# Patient Record
Sex: Male | Born: 1935
Health system: Southern US, Community
[De-identification: ages and names within clinical notes are randomized; demographics above are authoritative.]

## PROBLEM LIST (undated history)

## (undated) DIAGNOSIS — Z8719 Personal history of other diseases of the digestive system: Secondary | ICD-10-CM

## (undated) DIAGNOSIS — R0600 Dyspnea, unspecified: Secondary | ICD-10-CM

## (undated) DIAGNOSIS — I10 Essential (primary) hypertension: Secondary | ICD-10-CM

## (undated) DIAGNOSIS — E785 Hyperlipidemia, unspecified: Secondary | ICD-10-CM

## (undated) DIAGNOSIS — R911 Solitary pulmonary nodule: Secondary | ICD-10-CM

## (undated) DIAGNOSIS — E119 Type 2 diabetes mellitus without complications: Secondary | ICD-10-CM

## (undated) DIAGNOSIS — Z950 Presence of cardiac pacemaker: Secondary | ICD-10-CM

## (undated) DIAGNOSIS — U071 COVID-19: Secondary | ICD-10-CM

## (undated) DIAGNOSIS — I219 Acute myocardial infarction, unspecified: Secondary | ICD-10-CM

## (undated) DIAGNOSIS — R739 Hyperglycemia, unspecified: Secondary | ICD-10-CM

## (undated) HISTORY — DX: Hyperglycemia, unspecified: R73.9

## (undated) HISTORY — DX: Hyperlipidemia, unspecified: E78.5

## (undated) HISTORY — DX: Solitary pulmonary nodule: R91.1

---
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
## 1993-08-20 HISTORY — PX: CARDIAC CATHETERIZATION: SHX172

## 2000-02-15 ENCOUNTER — Ambulatory Visit (HOSPITAL_COMMUNITY): Admission: RE | Admit: 2000-02-15 | Discharge: 2000-02-15 | Payer: Self-pay | Admitting: *Deleted

## 2001-04-04 ENCOUNTER — Encounter (INDEPENDENT_AMBULATORY_CARE_PROVIDER_SITE_OTHER): Payer: Self-pay | Admitting: *Deleted

## 2001-04-04 ENCOUNTER — Ambulatory Visit (HOSPITAL_COMMUNITY): Admission: RE | Admit: 2001-04-04 | Discharge: 2001-04-05 | Payer: Self-pay | Admitting: *Deleted

## 2001-04-05 ENCOUNTER — Encounter: Payer: Self-pay | Admitting: *Deleted

## 2002-07-07 ENCOUNTER — Ambulatory Visit (HOSPITAL_COMMUNITY): Admission: RE | Admit: 2002-07-07 | Discharge: 2002-07-07 | Payer: Self-pay | Admitting: Urology

## 2003-07-06 ENCOUNTER — Ambulatory Visit (HOSPITAL_COMMUNITY): Admission: RE | Admit: 2003-07-06 | Discharge: 2003-07-06 | Payer: Self-pay | Admitting: Neurosurgery

## 2003-07-16 ENCOUNTER — Encounter: Admission: RE | Admit: 2003-07-16 | Discharge: 2003-07-16 | Payer: Self-pay | Admitting: Neurosurgery

## 2003-07-30 ENCOUNTER — Encounter: Admission: RE | Admit: 2003-07-30 | Discharge: 2003-07-30 | Payer: Self-pay | Admitting: Neurosurgery

## 2004-07-14 ENCOUNTER — Encounter: Admission: RE | Admit: 2004-07-14 | Discharge: 2004-07-14 | Payer: Self-pay | Admitting: Neurosurgery

## 2004-07-28 ENCOUNTER — Encounter: Admission: RE | Admit: 2004-07-28 | Discharge: 2004-07-28 | Payer: Self-pay | Admitting: Neurosurgery

## 2004-08-29 ENCOUNTER — Encounter: Admission: RE | Admit: 2004-08-29 | Discharge: 2004-08-29 | Payer: Self-pay | Admitting: Neurosurgery

## 2005-06-18 ENCOUNTER — Encounter (INDEPENDENT_AMBULATORY_CARE_PROVIDER_SITE_OTHER): Payer: Self-pay | Admitting: Specialist

## 2005-06-18 ENCOUNTER — Ambulatory Visit (HOSPITAL_COMMUNITY): Admission: RE | Admit: 2005-06-18 | Discharge: 2005-06-18 | Payer: Self-pay | Admitting: *Deleted

## 2006-05-03 ENCOUNTER — Encounter: Admission: RE | Admit: 2006-05-03 | Discharge: 2006-05-03 | Payer: Self-pay | Admitting: Internal Medicine

## 2006-08-20 HISTORY — PX: BACK SURGERY: SHX140

## 2006-12-20 ENCOUNTER — Inpatient Hospital Stay (HOSPITAL_COMMUNITY): Admission: RE | Admit: 2006-12-20 | Discharge: 2006-12-24 | Payer: Self-pay | Admitting: Neurosurgery

## 2007-01-01 ENCOUNTER — Ambulatory Visit (HOSPITAL_COMMUNITY): Admission: RE | Admit: 2007-01-01 | Discharge: 2007-01-01 | Payer: Self-pay | Admitting: Internal Medicine

## 2007-03-25 ENCOUNTER — Ambulatory Visit: Payer: Self-pay | Admitting: Thoracic Surgery

## 2007-03-25 ENCOUNTER — Encounter: Admission: RE | Admit: 2007-03-25 | Discharge: 2007-03-25 | Payer: Self-pay | Admitting: Thoracic Surgery

## 2008-01-16 ENCOUNTER — Encounter: Admission: RE | Admit: 2008-01-16 | Discharge: 2008-01-16 | Payer: Self-pay | Admitting: Internal Medicine

## 2008-06-24 ENCOUNTER — Encounter: Admission: RE | Admit: 2008-06-24 | Discharge: 2008-06-24 | Payer: Self-pay | Admitting: Internal Medicine

## 2008-08-03 ENCOUNTER — Encounter: Admission: RE | Admit: 2008-08-03 | Discharge: 2008-08-03 | Payer: Self-pay | Admitting: Internal Medicine

## 2008-08-20 HISTORY — PX: REPLACEMENT TOTAL KNEE: SUR1224

## 2009-02-03 ENCOUNTER — Encounter
Admission: RE | Admit: 2009-02-03 | Discharge: 2009-05-04 | Payer: Self-pay | Admitting: Physical Medicine & Rehabilitation

## 2009-02-04 ENCOUNTER — Ambulatory Visit: Payer: Self-pay | Admitting: Physical Medicine & Rehabilitation

## 2009-03-07 ENCOUNTER — Ambulatory Visit: Payer: Self-pay | Admitting: Physical Medicine & Rehabilitation

## 2009-04-14 ENCOUNTER — Ambulatory Visit: Payer: Self-pay | Admitting: Physical Medicine & Rehabilitation

## 2009-05-09 ENCOUNTER — Encounter
Admission: RE | Admit: 2009-05-09 | Discharge: 2009-06-30 | Payer: Self-pay | Admitting: Physical Medicine & Rehabilitation

## 2009-05-12 ENCOUNTER — Ambulatory Visit: Payer: Self-pay | Admitting: Physical Medicine & Rehabilitation

## 2009-05-30 ENCOUNTER — Ambulatory Visit: Payer: Self-pay | Admitting: Physical Medicine & Rehabilitation

## 2009-06-27 ENCOUNTER — Ambulatory Visit: Payer: Self-pay | Admitting: Physical Medicine & Rehabilitation

## 2009-11-28 ENCOUNTER — Inpatient Hospital Stay (HOSPITAL_COMMUNITY): Admission: RE | Admit: 2009-11-28 | Discharge: 2009-12-02 | Payer: Self-pay | Admitting: Orthopedic Surgery

## 2010-11-07 LAB — BASIC METABOLIC PANEL
BUN: 10 mg/dL (ref 6–23)
CO2: 33 mEq/L — ABNORMAL HIGH (ref 19–32)
Calcium: 8.5 mg/dL (ref 8.4–10.5)
Chloride: 95 mEq/L — ABNORMAL LOW (ref 96–112)
Creatinine, Ser: 0.75 mg/dL (ref 0.4–1.5)
GFR calc Af Amer: >60 mL/min (ref >60)
GFR calc non Af Amer: >60 mL/min (ref >60)
Glucose, Bld: 126 mg/dL — ABNORMAL HIGH (ref 70–99)
Potassium: 3.7 mEq/L (ref 3.5–5.1)
Sodium: 134 mEq/L — ABNORMAL LOW (ref 135–145)

## 2010-11-07 LAB — CBC
HCT: 27.5 % — ABNORMAL LOW (ref 39.0–52.0)
Hemoglobin: 9.4 g/dL — ABNORMAL LOW (ref 13.0–17.0)
MCHC: 34.1 g/dL (ref 30.0–36.0)
MCV: 94.5 fL (ref 78.0–100.0)
Platelets: 152 10*3/uL (ref 150–400)
RBC: 2.91 MIL/uL — ABNORMAL LOW (ref 4.22–5.81)
RDW: 13.3 % (ref 11.5–15.5)
WBC: 7 10*3/uL (ref 4.0–10.5)

## 2010-11-07 LAB — GLUCOSE, CAPILLARY: Glucose-Capillary: 139 mg/dL — ABNORMAL HIGH (ref 70–99)

## 2010-11-07 LAB — PROTIME-INR
INR: 1.13 (ref 0.00–1.49)
Prothrombin Time: 14.4 seconds (ref 11.6–15.2)

## 2010-11-08 LAB — PROTIME-INR
INR: 1.07 (ref 0.00–1.49)
INR: 1.12 (ref 0.00–1.49)
INR: 1.2 (ref 0.00–1.49)
Prothrombin Time: 14.3 seconds (ref 11.6–15.2)
Prothrombin Time: 15.1 seconds (ref 11.6–15.2)
Prothrombin Time: 15.7 seconds — ABNORMAL HIGH (ref 11.6–15.2)

## 2010-11-08 LAB — BASIC METABOLIC PANEL
BUN: 8 mg/dL (ref 6–23)
CO2: 29 mEq/L (ref 19–32)
Calcium: 8.3 mg/dL — ABNORMAL LOW (ref 8.4–10.5)
Calcium: 8.5 mg/dL (ref 8.4–10.5)
Chloride: 99 mEq/L (ref 96–112)
Creatinine, Ser: 0.71 mg/dL (ref 0.4–1.5)
Creatinine, Ser: 0.86 mg/dL (ref 0.4–1.5)
Creatinine, Ser: 0.96 mg/dL (ref 0.4–1.5)
GFR calc Af Amer: >60 mL/min (ref >60)
GFR calc Af Amer: >60 mL/min (ref >60)
GFR calc non Af Amer: >60 mL/min (ref >60)
GFR calc non Af Amer: >60 mL/min (ref >60)

## 2010-11-08 LAB — COMPREHENSIVE METABOLIC PANEL
AST: 23 U/L (ref 0–37)
Albumin: 4 g/dL (ref 3.5–5.2)
Alkaline Phosphatase: 54 U/L (ref 39–117)
BUN: 12 mg/dL (ref 6–23)
CO2: 27 mEq/L (ref 19–32)
Chloride: 101 mEq/L (ref 96–112)
GFR calc non Af Amer: >60 mL/min (ref >60)
Potassium: 3.8 mEq/L (ref 3.5–5.1)
Total Bilirubin: 1 mg/dL (ref 0.3–1.2)

## 2010-11-08 LAB — GLUCOSE, CAPILLARY
Glucose-Capillary: 107 mg/dL — ABNORMAL HIGH (ref 70–99)
Glucose-Capillary: 108 mg/dL — ABNORMAL HIGH (ref 70–99)
Glucose-Capillary: 130 mg/dL — ABNORMAL HIGH (ref 70–99)
Glucose-Capillary: 138 mg/dL — ABNORMAL HIGH (ref 70–99)
Glucose-Capillary: 145 mg/dL — ABNORMAL HIGH (ref 70–99)
Glucose-Capillary: 153 mg/dL — ABNORMAL HIGH (ref 70–99)
Glucose-Capillary: 157 mg/dL — ABNORMAL HIGH (ref 70–99)
Glucose-Capillary: 94 mg/dL (ref 70–99)

## 2010-11-08 LAB — CBC
HCT: 38.6 % — ABNORMAL LOW (ref 39.0–52.0)
Hemoglobin: 10.4 g/dL — ABNORMAL LOW (ref 13.0–17.0)
Hemoglobin: 13.3 g/dL (ref 13.0–17.0)
MCHC: 34.4 g/dL (ref 30.0–36.0)
MCHC: 34.4 g/dL (ref 30.0–36.0)
MCV: 93.4 fL (ref 78.0–100.0)
MCV: 94.2 fL (ref 78.0–100.0)
MCV: 94.9 fL (ref 78.0–100.0)
Platelets: 140 10*3/uL — ABNORMAL LOW (ref 150–400)
Platelets: 154 10*3/uL (ref 150–400)
RBC: 2.93 MIL/uL — ABNORMAL LOW (ref 4.22–5.81)
RBC: 3.22 MIL/uL — ABNORMAL LOW (ref 4.22–5.81)
RBC: 4.1 MIL/uL — ABNORMAL LOW (ref 4.22–5.81)
RDW: 13 % (ref 11.5–15.5)
WBC: 6 10*3/uL (ref 4.0–10.5)
WBC: 7.7 10*3/uL (ref 4.0–10.5)
WBC: 7.8 10*3/uL (ref 4.0–10.5)

## 2010-11-08 LAB — URINALYSIS, ROUTINE W REFLEX MICROSCOPIC
Bilirubin Urine: NEGATIVE
Hgb urine dipstick: NEGATIVE
Ketones, ur: NEGATIVE mg/dL
Specific Gravity, Urine: 1.016 (ref 1.005–1.030)
pH: 5.5 (ref 5.0–8.0)

## 2010-11-08 LAB — TYPE AND SCREEN: Antibody Screen: NEGATIVE

## 2011-01-02 NOTE — Letter (Signed)
March 25, 2007   Massie Maroon, MD  [address]  [place], [place]  [address]   Re:  [name], [name]               DOB:  01/05/1926   Dear Dr. Selena Batten:   I saw Dylan [name] in the office today.  He had recently underwent back  surgery and was found to have a right upper lobe lesion.  PET scan was  negative, so we elected to do a follow up CT scan now approximately  three months since his PET scan.  The CT scan showed no evidence of the  lesion throughout his right upper lobe, so this is probably some type of  a localized inflammatory nodule which is good news for Dylan [name].  His  blood pressure was 121/66, pulse 66, respirations 18.  I will be happy  to see him again if he develops any further pulmonary problems.  I  appreciate the opportunity of seeing Dylan [name].   Ines Bloomer, M.D.  Electronically Signed   DPB/MEDQ  D:  03/25/2007  T:  03/26/2007  Job:  [id number]   cc:   Colleen Can. Deborah Chalk, M.D.

## 2011-01-02 NOTE — H&P (Signed)
NAMEELMORE, HYSLOP NO.:  [id number]   MEDICAL RECORD NO.:  [id number]          PATIENT TYPE:  INP   LOCATION:  3009                         FACILITY:  [facility]   PHYSICIAN:  Hilda Lias, M.D.   DATE OF BIRTH:  [birth year]   DATE OF ADMISSION:  12/20/2006  DATE OF DISCHARGE:                              HISTORY & PHYSICAL   Mr. Edler is a gentleman who was seen initially by me at the end of  last year and, later on, several weeks ago, complaining of back pain  with radiation to both legs, which gets worse with walking.  At the  beginning, he was able to walk quite a bit, 2 or 3 blocks, but now up to  20 feet.  He had to sit because the pain gets unbearable.  When he lies  down in bed, there is no pain.  We know by x-ray that he has cervical  stenosis at the level of 3-4, 4-5.  Now, he comes because he wants to  proceed with surgery because he cannot live with the pain.   PAST MEDICAL HISTORY:  Needs surgery.  He has stent in heart.   HE IS ALLERGIC TO KEFLEX.   SOCIAL HISTORY:  Does not smoke.  Occasionally, he drinks.   FAMILY HISTORY:  Unremarkable.   REVIEW OF SYSTEMS:  Is positive for knee pain and back pain.   PHYSICAL EXAMINATION:  HEENT:  Head, ears and throat normal.  NECK:  Normal.  LUNGS:  Clear.  HEART:  Sounds normal.  ABDOMEN:  Normal.  EXTREMITIES:  Normal pulse.  NEURO:  He has weakness in both legs.  He has decrease in flexibility of  lumbar spine.  He had difficulty walking on tiptoes and heels, and  lifting both legs is positive bilaterally.   The x-ray shows he has a severe case of stenosis at L3-4 and L4-5 with  spondylolisthesis about 2 mm between L3 and L4, which does not move.   CLINICAL IMPRESSION:  L3-4, L4-5 stenosis with neurogenic claudication.   RECOMMENDATIONS:  The patient wants to proceed with surgery.  The  procedure will be bilateral 3, 4, 5 laminectomies with foraminotomy,  posterolateral fusion using the  autograft and __________ .  He knows  about the risks, such as infection, CSF leak, damage to the vessel of  the abdomen, need for further surgery, failure of the graft and all the  risks associated with his heart.           ______________________________  Hilda Lias, M.D.     EB/MEDQ  D:  12/20/2006  T:  12/21/2006  Job:  [id number]

## 2011-01-02 NOTE — Discharge Summary (Signed)
[name], Dylan               ACCOUNT NO.:  [id number]   MEDICAL RECORD NO.:  [id number]          PATIENT TYPE:  INP   LOCATION:  3009                         FACILITY:  [facility]   PHYSICIAN:  Hilda Lias, M.D.   DATE OF BIRTH:  1926/02/16   DATE OF ADMISSION:  12/20/2006  DATE OF DISCHARGE:  12/24/2006                               DISCHARGE SUMMARY   ADMISSION DIAGNOSIS:  Lumbar stenosis with neurogenic claudication.   FINAL DIAGNOSIS:  Lumbar stenosis with neurogenic claudication.   CLINICAL HISTORY:  The patient was admitted because of back pain  radiating to both legs.  The patient, a year ago, knew of his stenosis  at the level of 3-4, 4-5.  Now he comes to my office telling me he is  getting worse, he wants to go ahead and have surgery.   LABORATORY DATA:  Normal.   COURSE IN THE HOSPITAL:  The patient was taken to surgery and L3-4-5  laminectomy and posterior lateral arthrodesis was done.  Today he is  doing much better.  The wound looks fine.  He is afebrile.  He is  ambulating and he is ready to go home.   CONDITION ON DISCHARGE:  Improved.   MEDICATIONS:  Percocet, diazepam.   DIET:  Regular.   ACTIVITY:  He is not to drive for at least a week.   FOLLOWUP:  He will be seen by me in my office in 10 days.  Follow up  p.r.n.           ______________________________  Hilda Lias, M.D.     EB/MEDQ  D:  12/24/2006  T:  12/24/2006  Job:  [id number]

## 2011-01-02 NOTE — Group Therapy Note (Signed)
The patient referred by Dr. [facility].   HISTORY:  An 77 year old male with history of back pain of at least 6-  year duration.  He has had CAT scan back in 2004 showing L1-L2 disk  herniation multifactorial spinal stenosis L3-L4, and severe stenosis L4-  L5.  He has undergone epidural steroid injections interlaminar L5-S1 in  2004 and 2005, caudal epidural steroid injection in 2005 December and  further interlaminar in 2006. He demonstrated progressive spinal  stenosis on a follow up MRI in 2007 and eventually underwent lumbar  decompression with laminectomy, foraminotomy, posterolateral fusion Dec 20, 2006 that was on the op report however, the most recent MRI showed no  evidence of instrumentation. Postoperatively lumbar epidural injection  in May 2009 and November 2009 as well as bilateral L4-L5  facet  injections.  The most recent MRI demonstrated postop bilateral  laminectomy L4-L5, L5-S1 as well as L3-L4 level and has noted no  evidence of instrumentation.   His pain level is around 8/10, describes as sharp, stabbing, aching  issues.  His sleep is fair.  Pain is worse with walking, standing,  improves with resting with meds little to fair mobility.  He walks  without assistance.  He drives. He is independent with all self-care.   REVIEW OF SYSTEMS:  His review of systems positive for numbness, trouble  walking.  The numbness is primarily in the right lower extremity.  He  also has right knee pain which he thinks may be a separate issue has had  arthroscopy in the past.   PAST MEDICAL HISTORY:  Peptic ulcer disease status post GI bleed in  2001.  He has had  DVT, post arthroscopy many years ago.  He has had a  polypectomy for lower GI bleed and he had vein stripping in 2002 and  coronary stent placements although I do not have the date when that was  done.   SOCIAL HISTORY:  Married, lives with his wife.   PHYSICAL EXAM:  VITAL SIGNS:  His blood  pressure 150/60, pulse 62,  respirations 18 and O2 sat 96% room air.  GENERAL:  Well-developed, well-nourished male, truncal obesity.  Orientation x3.  Affect bright alert.  Gait is normal. He is able to toe  walk, heel walk, has coordination normal in the upper lower extremities.  NEUROLOGIC:  His sensation reduced to vibration, pinprick right L5-S1.  EXTREMITIES:  Without edema.   His neck has full range of motion.  Lumbar spine range of motion is 50%  forward flexion, extension, lateral rotation, bending.  His right knee  range of motion is decreased in flexion, full extension.  He has  crepitus flexion extension.  He does have some bony enlargement but no  evidence of valgus or varus deformity that I can appreciate.  He has no  evidence of erythema and minimal effusion compared to the left knee.   IMPRESSION:  1. Lumbar post laminectomy syndrome, chronic postoperative pain.  I      believe he has some multifactorial pain in his lumbar spine but      mainly axial and may be attributed to facet arthropathy.      Therefore, we will set him up for medial branch blocks as a      diagnostic procedure and proceed from there.  2. Right knee pain likely arthritic.  The patient will decide whether      he wants to go see his orthopedic surgeon for this or  whether we      will go ahead and address this as part of his overall pain program.   I spoke to him and his wife about these issues in agreement.  Really not  on any narcotic analgesics not going down his throat at this point.  The  patient seems fine with that.  Consider some physical therapy at least  to get him exercise program that he can do on  ongoing basis so that we  will wait until we identify pain generator.      Erick Colace, M.D.  Electronically Signed     AEK/MedQ  D:  02/04/2009 17:11:53  T:  02/05/2009 09:27:20  Job #:  [id number]   cc:   Massie Maroon, MD  Fax: [phone]

## 2011-01-02 NOTE — Procedures (Signed)
Dylan [name], BALLIN               ACCOUNT NO.:  [id number]   MEDICAL RECORD NO.:  [id number]           PATIENT TYPE:   LOCATION:                                 FACILITY:   PHYSICIAN:  Erick Colace, M.D.DATE OF BIRTH:  [birth year]   DATE OF PROCEDURE:  04/14/2009  DATE OF DISCHARGE:                               OPERATIVE REPORT   Bilateral L5 dorsal ramus injection, bilateral L4 medial branch block,  bilateral L3 medial branch block under fluoroscopic guidance.   INDICATIONS:  Lumbar postlaminectomy syndrome.  Pain only partially  responsive to medication management and interfering with self-care  mobility.  He had about 50% relief for the day of injection but was not  very active that day, here for confirmatory block with longer-acting  local anesthetic.  Informed consent was obtained after describing risks  and benefits of procedure with the patient.  These include bleeding,  bruising, infection.  He elects to proceed and has given written  consent.   The patient placed prone on fluoroscopy table.  Betadine prep, sterile  drape.  A 25-gauge inch and a half needle was used to anesthetize the  skin and subcu tissue, 1% lidocaine x1.5 mL at each of 6 sites.  Then, a  22-gauge 3-1/2-inch spinal needle was inserted under fluoroscopic  guidance targeting the left S1 sacral ala junction with the SAP.  Bone  contact made, confirmed with lateral imaging.  Omnipaque 180 x0.5 mL  demonstrated no intravascular uptake.  Then, 0.5% Marcaine was injected.  Then, the left L5 SAP transverse process junction targeted, bone contact  made.  Omnipaque 180 under live fluoro demonstrated no intravascular  uptake.  Then, 0.5 mL of the Marcaine solution was injected.  Then, the  left L4 SAP transverse process junction targeted, bone contact made.  Omnipaque 180 x0.5 mL demonstrated no intravascular uptake.  Then, 0.5  mL of the Marcaine solution was injected.  The same procedure was  repeated on  the right side with the same needle, injectate, and  technique.  The patient tolerated the procedure well.  Pre and  postinjection vitals stable.  Postinjection instructions given.  Preinjection 10/10, postinjection 07/10.  We will follow over time.  See  him back for followup visit and decide whether radiofrequency would be a  promising option.      Erick Colace, M.D.  Electronically Signed     AEK/MEDQ  D:  04/14/2009 10:21:16  T:  04/15/2009 00:16:34  Job:  [id number]

## 2011-01-02 NOTE — Op Note (Signed)
Dylan [name], Dylan [name]               ACCOUNT NO.:  [id number]   MEDICAL RECORD NO.:  [id number]          PATIENT TYPE:  INP   LOCATION:  3172                         FACILITY:  [facility]   PHYSICIAN:  Hilda Lias, M.D.   DATE OF BIRTH:  02-10-1926   DATE OF PROCEDURE:  12/20/2006  DATE OF DISCHARGE:                               OPERATIVE REPORT   PREOPERATIVE DIAGNOSIS:  Lumbar stenosis with neurogenic claudication, 3-  4, 4-5.   POSTOPERATIVE DIAGNOSIS:  Lumbar stenosis with neurogenic claudication,  3-4, 4-5.   PROCEDURES:  Bilateral L3, 4 and 5 laminectomy, foraminotomy;  posterolateral fusion with autograft and bone morphogenic protein.   SURGEON:  Hilda Lias, M.D.   CLINICAL HISTORY:  Dylan [name] is a gentleman, who was seen more than a  year ago, complaining of back pain with radiation to both legs.  The  patient was getting worse, and x-rays showed that he had stenosis at the  levels of 3-4 and 4-5, with some stepoff between 4-5, which does not  move into flexion or extension.  The patient wanted to proceed with  surgery, because right now he only can walk 20 feet before he has to sit  because of pain.  Surgery was advised and the risks were explained to  him at the History and Physical.   DESCRIPTION OF PROCEDURES:  Dylan [name] was taken to the operating room,  and after intubation, he was positioned in a prone manner.  The back was  cleaned with DuraPrep.  Drapes were applied.  A midline incision from L3  down to L5-S1 was made, and muscles were retracted laterally.  The  patient has a highly vascularized muscle, and the arteries were quite  atherosclerotic.  Nevertheless, at the end, we had good hemostasis, and  we were able to go beyond the lateral aspect of the facetectomy.  Then,  we proceeded with removal of the spinous processes of 3, 4 and 5, and  with a drill, we did laminectomies of 3, 4 and 5 bilaterally.  Then,  using the 2, 3 and 4-mm Kerrison punches, we  started to decompress the  thecal sac.  The worst part was at the junction between 3-4, and also  between 4-5.  The patient had quite a bit of thickening of the yellow  ligament, as well as calcification.  Using the drill, we were able to go  laterally.  Then, using the 2 and 3-mm Kerrison punches, we decompressed  the L2, L3, L4, L5 and S1 nerve roots bilaterally.  Having done this, we  went laterally and we drilled the periosteum on the lateral aspect of  the facet of 3-4, 4-5 and 5-1, and a mix of BMP and autograft was used  for arthrodesis.  Although we had good hemostasis, nevertheless, we  decided to leave a Hemovac in the epidural space.  The area was  irrigated, and the wound was closed with Vicryl and nylon.  The patient  is going to go to the PACU.           ______________________________  Hilda Lias, M.D.  EB/MEDQ  D:  12/20/2006  T:  12/20/2006  Job:  [id number]

## 2011-01-02 NOTE — Procedures (Signed)
Dylan [name], Dylan [name]               ACCOUNT NO.:  [id number]   MEDICAL RECORD NO.:  [id number]           PATIENT TYPE:   LOCATION:                                 FACILITY:   PHYSICIAN:  Erick Colace, M.D.DATE OF BIRTH:  [birth year]   DATE OF PROCEDURE:  DATE OF DISCHARGE:                               OPERATIVE REPORT   PROCEDURE:  Bilateral L5 dorsal ramus injection, bilateral L4 medial  branch block, bilateral L3 medial branch block under fluoroscopic  guidance.   INDICATIONS:  Lumbar pain post laminectomy syndrome.  Pain is only  partially responsive to medication management, interferes with self-  care, mobility.  Informed consent was obtained after describing risks  and benefits of the procedure with the patient.  These include bleeding,  bruising, infection.  He elects to proceed and has given written  consent.   The patient placed prone on the fluoroscopy table.  Betadine prep,  sterile drape.  A 25-gauge inch and a half needle was used to  anesthetize the skin and subcutaneous tissue, 1% lidocaine x2 mL at each  of six sites.  Then a 22-gauge 2-1/2 inch spinal needle was inserted  under fluoroscopic guidance starting at the left S1 SAP sacral ala  junction, bone contact made, confirmed with lateral imaging.  Omnipaque  180 x 0.5 mL demonstrated no intravascular uptake, then 0.5 mL of the  solution containing 1 mL of 4 mg/mL dexamethasone and 2 mL of 2% MPF  lidocaine.  Then, left L5 SAP transverse process junction targeted, bone  contact made.  Omnipaque 180 under live fluoro demonstrated no  intravascular uptake, then 0.5 mL of dexamethasone-lidocaine solution  was injected.  Then, left L4 SAP transverse process junction targeted,  bone contact made.  Omnipaque 180 x 0.5 mL demonstrated no intravascular  uptake.  Then 0.5 mL of the dexamethasone lidocaine solution was  injected.  The same procedure was repeated on the right side with the  same needle injectate and  technique.  The patient tolerated the  procedure well.  Right side injections done at corresponding levels.  Pre and post injection vitals stable.  Post injection instructions  given.  I will see him back in 4 weeks for possible reinjection.      Erick Colace, M.D.  Electronically Signed     AEK/MEDQ  D:  03/07/2009 13:46:45  T:  03/08/2009 03:07:41  Job:  [id number]

## 2011-01-05 NOTE — Op Note (Signed)
[place]  Patient:    Dylan [name], Dylan [name]                      MRN: [id number] Proc. Date: 04/04/01 Adm. Date:  [id number] Disc. Date: [id number] Attending:  Melvenia Needles                           Operative Report  PREOPERATIVE DIAGNOSES: 1. Bilateral greater saphenous vein incompetence with varicose veins. 2. Multiple varicose veins bilaterally.  POSTOPERATIVE DIAGNOSES: 1. Bilateral greater saphenous vein incompetence with varicose veins. 2. Multiple varicose veins bilaterally.  OPERATION: 1. Greater saphenous vein stripping and ligation bilaterally. 2. Stripping and ligation of multiple venous tributaries bilaterally,    lower extremities.  SURGEON:  Denman George, M.D.  ASSISTANT:   Adair Patter, P.A.  ANESTHESIA:  General endotracheal anesthesia.  ANESTHESIOLOGIST:  Edwin Cap. Zoila Shutter, M.D.  DESCRIPTION OF OPERATIVE PROCEDURE:  The patient was brought to the operating room in stable condition and placed in the supine position.  General endotracheal anesthesia was induced.  Both legs prepped and draped in a sterile fashion.  Oblique skin incision was made in the groin bilaterally at the saphenofemoral function.  Bilateral saphenofemoral junction explored.  Tributaries ligated with 3-0 silk and divided.  Saphenous vein ligated at saphenofemoral junction with 2-0 silk.  Bilateral medial malleolar transverse skin incisions were made in saphenous vein.  Saphenous vein exposed to reach incision, ligated distally with 2-0 silk.  Saphenous vein opened transversely.  The stripper was attempted to be passed antegrade up the saphenous vein; however, unsuccessfully.  The saphenous vein was therefore ligated at the saphenofemoral junction and divided.  The stripper was passed retrograde each leg, and in the right leg could be passed to the proximal calf and in the distal leg to the mid calf. At the point where the stripper stopped,  transverse skin incision was made over the greater saphenous vein.  The saphenous vein exposed through this. Transverse venotomy made and then the stripper passed through the venotomy. Stripper assembled, and each greater saphenous vein was stripped entirely from the mid proximal calf to the groin.  At premarked sites, transverse skin incisions were made over large venous tributaries.  These were stripped and ligated with 2-0 silk ties.  The wounds were irrigated with saline solution.  The groin incisions closed with running 3-0 Vicryl suture for subcutaneous tissue and 4-0 Monocryl for skin.  The ankle incisions were also closed with running 3-0 Vicryl sutures for subcutaneous tissue and 4-0 Monocryl for skin. The small cut incisions were closed with interrupted 3-0 Vicryl suture for dermal layer.  Half-inch Steri-Strips applied.  A dressing of Kerlix and Ace wrap was applied to the legs bilaterally.  No apparent complications.  Patient was transferred to the recovery room in stable condition. DD:  04/04/01 TD:  04/05/01 Job: [id number]

## 2011-01-05 NOTE — H&P (Signed)
Sherrill, Maksim   [place]                                  MRN:  [id number]         History and Physical                                  ATT:  Denman George, M.D.                                   DICT: Durenda Age, P.A.-C. CHIEF COMPLAINT:  Bilateral varicose veins.  HISTORY OF PRESENT ILLNESS:  This is a 77 year old, white male referred by Dr. Lendell Caprice for evaluation of varicose veins bilaterally.  His varicosities have been present for several years with some degree of swelling in both lower extremities, more pronounced on the right than on the left, with a feeling of heaviness and achiness.  Initial treatment with compression stockings produced some relief, but continued to experience aching and discomfort.  Dr. Madilyn Fireman, upon evaluation, recommended at this time to schedule bilateral ligation and stripping of these varicose veins on April 04, 2001.  PAST MEDICAL HISTORY: 1. Hypertension. 2. Hypercholesterolemia. 3. Irregular heart beat of unknown etiology. 4. Previous history of PUD. 5. Previous history of "skin cancers," unknown type. 6. History of DVT one week after right knee arthroscopy in 1986.  PAST SURGICAL HISTORY:  Status post right knee arthroscopy in 1986, with Dr. Tresa Res.  MEDICATIONS: 1. Toprol 50 mg q.d. 2. Lipitor 10 mg q.d. 3. Aspirin 81 mg q.o.d.  ALLERGY:  ANCEF.  REVIEW OF SYSTEMS:  See HPI and past medical history for significant positives.  No diabetes, kidney disease or asthma.  FAMILY HISTORY:  Mother died of cancer at age 87.  Father died at age 41 of stroke.  One brother died of heart failure.  SOCIAL HISTORY:  Widowed with one grown child.  He is retired.  He quit in 1992.  He uses tobacco.  He drinks one glass of wine a day.  PHYSICAL EXAMINATION:  GENERAL:  Well-developed, well-nourished, 77 year old, white male in no acute distress, alert and oriented x 3.  VITAL SIGNS:   Blood pressure 130/80, pulse 64, respiratory rate 16.  HEENT:  Normocephalic, atraumatic.  PERRLA.  EOMI.  Funduscopic exam within normal limits.  The patient has arcus senilis bilaterally.  NECK:  Supple, no JVD, bruits or lymphadenopathy.  CHEST:  Symmetrical on inspiration.  LUNGS:  No wheezes, rhonchi, rales or lymphadenopathy.  CARDIAC:  Regular rate and rhythm without murmurs, rubs or gallops.  ABDOMEN:  Soft, nontender, bowel sounds x 4.  No masses or bruits.  GU/RECTAL:  Deferred.  EXTREMITIES:  No clubbing, cyanosis, or edema.  No ulcerations.  There are pronounced varicosities more visual on the right than on the left, which are reducible with elevation, nontender to palpation, no Homans sign. Temperature warm.  Peripheral pulses, carotid, femoral, popliteal, dorsalis pedis and posterior tibialis 2+ bilaterally.  NEUROLOGIC:  Nonfocal.  DTRs 2+ bilaterally.  Muscle strength 5/5.  ASSESSMENT/PLAN:  Ligation and stripping of the varicose veins bilaterally secondary to pronounced varicosities by Dr. Madilyn Fireman on April 04, 2001. Dr. Madilyn Fireman has seen and evaluated this patient prior to the admission and has explained the risks  and benefits involving the procedure and the patient has agreed to continue. DD:  04/02/01 TD:  04/02/01 Job: [id number]

## 2011-01-05 NOTE — Procedures (Signed)
[facility]  Patient:    [name], Dylan                      MRN: [id number] Adm. Date:  [id number] Attending:  Sabino Gasser                           Procedure Report  PROCEDURE:  Upper endoscopy with biopsy.  ENDOSCOPIST:  Sabino Gasser, M.D.  INDICATION:  GI bleed.  ANESTHESIA:  Demerol 40 mg and Versed 4 mg were given intravenously in divided dose.  DESCRIPTION OF PROCEDURE:  With patient mildly sedated in the left lateral decubitus position, the Olympus videoscopic endoscope was inserted in the mouth and passed under direct vision through the esophagus, which appeared normal, and was photographed.  We advanced through this area.  Fundus, body and antrum were visualized and the pylorus was widely patent.  We entered easily into this and it was seen to be quite inflamed and there were two ulcers seen, one anteriorly and one posteriorly, both fairly deep craters, no visible vessels seen, but slight oozing from both, more so the anterior than the posterior.  We advanced past the second portion of duodenum, which appeared normal, and it was photographed.  From this point, the endoscope was then slowly withdrawn, take circumferential views of the entire duodenal mucosa.  We washed the duodenal bulb area looking for further pathology and saw none other than these two ulcers at this time.  The endoscope was pulled back into the stomach and placed in retroflexion to view the stomach from below and this appeared normal and was photographed.  The endoscope was straightened and pulled back then from distal-to-proximal stomach, taking circumferential views of the entire gastric and subsequently esophageal mucosa, after a CLO biopsy was taken.  The endoscope was withdrawn.  Patients vital signs and pulse oximetry remained stable.  Patient tolerated the procedure well without apparent complications.  FINDINGS:  Ulceration of duodenal bulb in two locations,  anterior and posterior, with oozing seen, no visible vessels, and probably diffuse H. pylori gastritis noted with CLO biopsy taken.  PLAN:  Patient has already been started on Helicobacter pylori therapy in the form of Prevpac which we will continue for the full two-week course; subsequent to that, we will continue Prevacid daily and will have the patient follow up with me as an outpatient.  The stools have turned from black to dark brown, indicating a significant slowing of his blood loss.  Additionally, I will tell him to refrain from aspirin at this time, even in low dose, and refrain from alcohol.  He plans to take a fishing trip with friends, leaving for [facility] in the morning, and I think he can do it if he abstains from alcohol and NSAIDs and takes his medication. DD:  02/15/00 TD:  02/16/00 Job: [id number]

## 2011-01-05 NOTE — Op Note (Signed)
NAMELOEL, BETANCUR               ACCOUNT NO.:  [id number]   MEDICAL RECORD NO.:  [id number]          PATIENT TYPE:  AMB   LOCATION:  ENDO                         FACILITY:  [facility]   PHYSICIAN:  Georgiana Spinner, M.D.    DATE OF BIRTH:  [birth year]   DATE OF PROCEDURE:  06/18/2005  DATE OF DISCHARGE:                                 OPERATIVE REPORT   PROCEDURE:  Colonoscopy with polypectomy and biopsy.   INDICATIONS:  Colon polyp, rectal bleeding.   ANESTHESIA:  Demerol 40, Versed 7 mg.   PROCEDURE:  With the patient mildly sedated in the left lateral decubitus  position, a rectal examination was performed which was unremarkable to my  exam. Subsequently the Olympus videoscopic colonoscope was inserted in the  rectum, passed under direct vision to the cecum identified by the ileocecal  valve and crow's foot of the cecum both of which were photographed. From  this point, the colonoscope was slowly withdrawn taking circumferential  views of the colonic mucosa stopping in the ascending colon to photograph an  approximately 2 cm lipoma. We next stopped at approximately 25 cm from anal  verge at which point a polyp was seen, photographed and removed using a  snare. Unfortunately the nurse cut through the snare quickly and I had to go  back with the hot biopsy forceps to burn this area which we did.  Subsequently the endoscope was withdrawn to the rectum which appeared normal  on direct and showed hemorrhoids on retroflexed view. The endoscope was  straightened and withdrawn. The patient's vital signs and pulse oximeter  remained stable. The patient tolerated the procedure well without apparent  complications other than the snaring of the polyp without cautery  subsequently corrected with hot biopsy forceps.   PLAN:  Await biopsy report. The patient will call me for results and follow-  up with me as an outpatient.           ______________________________  Georgiana Spinner, M.D.     GMO/MEDQ  D:  06/18/2005  T:  06/18/2005  Job:  [id number]

## 2011-06-19 ENCOUNTER — Other Ambulatory Visit: Payer: Self-pay | Admitting: Gastroenterology

## 2012-01-07 ENCOUNTER — Encounter (HOSPITAL_COMMUNITY): Payer: Self-pay

## 2012-01-08 ENCOUNTER — Ambulatory Visit (HOSPITAL_COMMUNITY): Payer: Medicare Other | Admitting: Anesthesiology

## 2012-01-08 ENCOUNTER — Ambulatory Visit (HOSPITAL_COMMUNITY)
Admission: RE | Admit: 2012-01-08 | Discharge: 2012-01-08 | Disposition: A | Discharge status: Home / Self Care | Payer: Medicare Other | Source: Ambulatory Visit | Attending: Gastroenterology | Admitting: Gastroenterology

## 2012-01-08 ENCOUNTER — Encounter (HOSPITAL_COMMUNITY): Payer: Self-pay | Admitting: *Deleted

## 2012-01-08 ENCOUNTER — Encounter (HOSPITAL_COMMUNITY)
Admission: RE | Disposition: A | Discharge status: Home / Self Care | Payer: Self-pay | Source: Ambulatory Visit | Attending: Gastroenterology

## 2012-01-08 ENCOUNTER — Encounter (HOSPITAL_COMMUNITY): Payer: Self-pay | Admitting: Anesthesiology

## 2012-01-08 DIAGNOSIS — D126 Benign neoplasm of colon, unspecified: Secondary | ICD-10-CM | POA: Insufficient documentation

## 2012-01-08 HISTORY — DX: Essential (primary) hypertension: I10

## 2012-01-08 HISTORY — PX: HOT HEMOSTASIS: SHX5433

## 2012-01-08 LAB — BASIC METABOLIC PANEL
CO2: 27 mEq/L (ref 19–32)
Chloride: 103 mEq/L (ref 96–112)
Creatinine, Ser: 0.85 mg/dL (ref 0.50–1.35)
GFR calc Af Amer: >90 mL/min (ref >90)
Potassium: 3.9 mEq/L (ref 3.5–5.1)
Sodium: 139 mEq/L (ref 135–145)

## 2012-01-08 LAB — HEMOGLOBIN AND HEMATOCRIT, BLOOD
HCT: 41.2 % (ref 39.0–52.0)
Hemoglobin: 14.2 g/dL (ref 13.0–17.0)

## 2012-01-08 SURGERY — COLONOSCOPY WITH PROPOFOL
Anesthesia: Monitor Anesthesia Care

## 2012-01-08 MED ORDER — LACTATED RINGERS IV SOLN
INTRAVENOUS | Status: DC
  Administered 2012-01-08: 1000 mL via INTRAVENOUS

## 2012-01-08 MED ORDER — METHYLENE BLUE 1 % INJ SOLN
INTRAMUSCULAR | Status: AC
  Filled 2012-01-08: qty 10

## 2012-01-08 MED ORDER — FENTANYL CITRATE 0.05 MG/ML IJ SOLN
INTRAMUSCULAR | Status: DC | PRN
  Administered 2012-01-08 (×2): 25 ug via INTRAVENOUS
  Administered 2012-01-08: 50 ug via INTRAVENOUS

## 2012-01-08 MED ORDER — MIDAZOLAM HCL 5 MG/5ML IJ SOLN
INTRAMUSCULAR | Status: DC | PRN
  Administered 2012-01-08: 1 mg via INTRAVENOUS

## 2012-01-08 MED ORDER — PROPOFOL 10 MG/ML IV EMUL
INTRAVENOUS | Status: DC | PRN
  Administered 2012-01-08: 70 ug/kg/min via INTRAVENOUS

## 2012-01-08 SURGICAL SUPPLY — 22 items

## 2012-01-08 NOTE — Op Note (Signed)
[facility] [address] [place], [place]  [address]  COLONOSCOPY PROCEDURE REPORT  PATIENT:  Dylan [name], Dylan [name]  MR#:  [id number] BIRTHDATE:  02/24/1926, 85 yrs. old  GENDER:  male ENDOSCOPIST:  Willis Modena, MD REF. BY:  Pearson Grippe, M.D. PROCEDURE DATE:  01/08/2012 PROCEDURE:  Colonoscopy 30865 ASA CLASS:  Class III INDICATIONS:  colon polyp (211.3), submucosal lift of polyp MEDICATIONS:   MAC sedation, administered by CRNA  DESCRIPTION OF PROCEDURE:   After the risks benefits and alternatives of the procedure were thoroughly explained, informed consent was obtained.  Digital rectal exam was performed and revealed no abnormalities.   The Pentax Colonoscope [id number] and [id number]) endoscope was introduced through the anus and advanced to the cecum, which was identified by the ileocecal valve, without limitations.  The quality of the prep was good.. The instrument was then slowly withdrawn as the colon was fully examined. <<PROCEDUREIMAGES>>  FINDINGS:  Prep quality was good.  Few left colonic diverticula. Ileocecal valve reached; was able to look into the cecum, but could not visualize appendix and could not visualize all of the cecum. Directly contiguous to, but seemingly not involving, the ileocecal valve, a large carpeting multilobulated polyp was seen, approximately 20 x 25mm in diameter.  The polyp was soft in texture, and appears to grow over a fold (clamshell-type configuration) with extension into the proximal cecum and near (but seemingly not involving) the ilececal valve orifice. Attempts were made to submucosally lift the polyp with saline/methylene blue mixture but the polyp wouldn't elevate well. There was a 6mm proximal ascending colon polyp seen as wll, and this was not removed (will be in anticipated upcoming surgical field).  No other polyps, masses, vascular ectasias, or inflammatory changes were seen.  Normal retroflexed view  of rectum.  ENDOSCOPIC IMPRESSION:    1.  Large carpeting polyp adjacent to ileocecal valve, adenomatous by prior biopsies. 2.  Proximal ascending colon polyp, diminutive, not removed. 3.  Few left colonic diverticula. 4.  Otherwise normal colonoscopy.  RECOMMENDATIONS:      1.  Watch for potential complications of procedure. 2.  Surgical consultation for consideration of right hemicolectomy. 3.  Will discuss with Dr. Madilyn Fireman.  REPEAT EXAM:  No  ______________________________ Willis Modena  CC:  n. eSIGNEDWillis Modena at 01/08/2012 12:19 PM  Michela Pitcher, [phone]

## 2012-01-08 NOTE — Transfer of Care (Signed)
Immediate Anesthesia Transfer of Care Note  Patient: Dylan [name]  Procedure(s) Performed: Procedure(s) (LRB): COLONOSCOPY WITH PROPOFOL (N/A) HOT HEMOSTASIS (ARGON PLASMA COAGULATION/BICAP) (N/A)  Patient Location: PACU  Anesthesia Type: MAC  Level of Consciousness: awake, alert , oriented and patient cooperative  Airway & Oxygen Therapy: Patient Spontanous Breathing and Patient connected to face mask oxygen  Post-op Assessment: Report given to PACU RN, Post -op Vital signs reviewed and stable and Patient moving all extremities X 4  Post vital signs: stable  Complications: No apparent anesthesia complications

## 2012-01-08 NOTE — H&P (Signed)
Patient interval history reviewed.  Patient examined again.  There has been no change from documented H/P dated 01/04/12 (scanned into chart from our office) except as documented above.  Assessment:  Large ileocecal valve polyp (adenomatous), incomplete removal on prior colonoscopy.  Plan:  1.  Retry colonoscopy for complete polypectomy.  Patient aware of risks, and he is aware of possibility that I will not be able to remove this polyp in its entirety. 2.  Risks (bleeding, infection, bowel perforation that could require surgery, sedation-related changes in cardiopulmonary systems), benefits (identification and possible treatment of source of symptoms, exclusion of certain causes of symptoms), and alternatives (watchful waiting, radiographic imaging studies, empiric medical treatment) of colonoscopy were explained to patient in detail and he wishes to proceed.

## 2012-01-08 NOTE — Discharge Instructions (Signed)
Colonoscopy A colonoscopy is an exam to evaluate your entire colon. In this exam, your colon is cleansed. A long fiberoptic tube is inserted through your rectum and into your colon. The fiberoptic scope (endoscope) is a long bundle of enclosed and very flexible fibers. These fibers transmit light to the area examined and send images from that area to your caregiver. Discomfort is usually minimal. You may be given a drug to help you sleep (sedative) during or prior to the procedure. This exam helps to detect lumps (tumors), polyps, inflammation, and areas of bleeding. Your caregiver may also take a small piece of tissue (biopsy) that will be examined under a microscope. LET YOUR CAREGIVER KNOW ABOUT:  Allergies to food or medicine.  Medicines taken, including vitamins, herbs, eyedrops, over-the-counter medicines, and creams.  Use of steroids (by mouth or creams).  Previous problems with anesthetics or numbing medicines.  History of bleeding problems or blood clots.  Previous surgery.  Other health problems, including diabetes and kidney problems.  Possibility of pregnancy, if this applies.  BEFORE THE PROCEDURE  A clear liquid diet may be required for 2 days before the exam.  Ask your caregiver about changing or stopping your regular medications.  Liquid injections (enemas) or laxatives may be required.  A large amount of electrolyte solution may be given to you to drink over a short period of time. This solution is used to clean out your colon.  You should be present 60 minutes prior to your procedure or as directed by your caregiver.  AFTER THE PROCEDURE  If you received a sedative or pain relieving medication, you will need to arrange for someone to drive you home.  Occasionally, there is a little blood passed with the first bowel movement. Do not be concerned.  FINDING OUT THE RESULTS OF YOUR TEST Not all test results are available during your visit. If your test results are not back during  the visit, make an appointment with your caregiver to find out the results. Do not assume everything is normal if you have not heard from your caregiver or the medical facility. It is important for you to follow up on all of your test results. HOME CARE INSTRUCTIONS  It is not unusual to pass moderate amounts of gas and experience mild abdominal cramping following the procedure. This is due to air being used to inflate your colon during the exam. Walking or a warm pack on your belly (abdomen) may help.  You may resume all normal meals and activities after sedatives and medicines have worn off.  Only take over-the-counter or prescription medicines for pain, discomfort, or fever as directed by your caregiver. Do not use aspirin or blood thinners if a biopsy was taken. Consult your caregiver for medicine usage if biopsies were taken.  SEEK IMMEDIATE MEDICAL CARE IF:  You have a fever.  You pass large blood clots or fill a toilet with blood following the procedure. This may also occur 10 to 14 days following the procedure. This is more likely if a biopsy was taken.  You develop abdominal pain that keeps getting worse and cannot be relieved with medicine.  Document Released: 08/03/2000 Document Revised: 07/26/2011 Document Reviewed: 03/18/2008 [facility] Patient Information 2012 [place], [place].Colon Polyps A polyp is extra tissue that grows inside your body. Colon polyps grow in the large intestine. The large intestine, also called the colon, is part of your digestive system. It is a long, hollow tube at the end of your digestive tract where  your body makes and stores stool. Most polyps are not dangerous. They are benign. This means they are not cancerous. But over time, some types of polyps can turn into cancer. Polyps that are smaller than a pea are usually not harmful. But larger polyps could someday become or may already be cancerous. To be safe, doctors remove all polyps and test them.  WHO GETS  POLYPS? Anyone can get polyps, but certain people are more likely than others. You may have a greater chance of getting polyps if:  You are over 50.   You have had polyps before.   Someone in your family has had polyps.   Someone in your family has had cancer of the large intestine.   Find out if someone in your family has had polyps. You may also be more likely to get polyps if you:   Eat a lot of fatty foods.   Smoke.   Drink alcohol.   Do not exercise.   Eat too much.  SYMPTOMS  Most small polyps do not cause symptoms. People often do not know they have one until their caregiver finds it during a regular checkup or while testing them for something else. Some people do have symptoms like these:  Bleeding from the anus. You might notice blood on your underwear or on toilet paper after you have had a bowel movement.   Constipation or diarrhea that lasts more than a week.   Blood in the stool. Blood can make stool look black or it can show up as red streaks in the stool.  If you have any of these symptoms, see your caregiver. HOW DOES THE DOCTOR TEST FOR POLYPS? The doctor can use four tests to check for polyps:  Digital rectal exam. The caregiver wears gloves and checks your rectum (the last part of the large intestine) to see if it feels normal. This test would find polyps only in the rectum. Your caregiver may need to do one of the other tests listed below to find polyps higher up in the intestine.   Barium enema. The caregiver puts a liquid called barium into your rectum before taking x-rays of your large intestine. Barium makes your intestine look white in the pictures. Polyps are dark, so they are easy to see.   Sigmoidoscopy. With this test, the caregiver can see inside your large intestine. A thin flexible tube is placed into your rectum. The device is called a sigmoidoscope, which has a light and a tiny video camera in it. The caregiver uses the sigmoidoscope to look at  the last third of your large intestine.   Colonoscopy. This test is like sigmoidoscopy, but the caregiver looks at all of the large intestine. It usually requires sedation. This is the most common method for finding and removing polyps.  TREATMENT   The caregiver will remove the polyp during sigmoidoscopy or colonoscopy. The polyp is then tested for cancer.   If you have had polyps, your caregiver may want you to get tested regularly in the future.  PREVENTION  There is not one sure way to prevent polyps. You might be able to lower your risk of getting them if you:  Eat more fruits and vegetables and less fatty food.   Do not smoke.   Avoid alcohol.   Exercise every day.   Lose weight if you are overweight.   Eating more calcium and folate can also lower your risk of getting polyps. Some foods that are rich in calcium  are milk, cheese, and broccoli. Some foods that are rich in folate are chickpeas, kidney beans, and spinach.   Aspirin might help prevent polyps. Studies are under way.  Document Released: 05/02/2004 Document Revised: 07/26/2011 Document Reviewed: 10/08/2007 [facility] Patient Information 2012 [place], [place].

## 2012-01-08 NOTE — Anesthesia Preprocedure Evaluation (Signed)
Anesthesia Evaluation  Patient identified by MRN, date of birth, ID band Patient awake  General Assessment Comment:Advanced yrs  Reviewed: Allergy & Precautions, H&P , NPO status , Patient's Chart, lab work & pertinent test results, reviewed documented beta blocker date and time   Airway Mallampati: II TM Distance: >3 FB Neck ROM: Full    Dental  (+) Partial Upper   Pulmonary neg pulmonary ROS,  breath sounds clear to auscultation        Cardiovascular hypertension, Pt. on medications Rhythm:Regular Rate:Normal  Denies cardiac symptoms   Neuro/Psych negative neurological ROS  negative psych ROS   GI/Hepatic negative GI ROS, Neg liver ROS, Colonoscopy, screening for polyps   Endo/Other  negative endocrine ROSPt denies hx of DM, says he doesn't know why he is on metformin  Renal/GU negative Renal ROS  negative genitourinary   Musculoskeletal negative musculoskeletal ROS (+)   Abdominal   Peds negative pediatric ROS (+)  Hematology negative hematology ROS (+)   Anesthesia Other Findings   Reproductive/Obstetrics negative OB ROS                           Anesthesia Physical Anesthesia Plan  ASA: III  Anesthesia Plan: MAC   Post-op Pain Management:    Induction:   Airway Management Planned: Mask  Additional Equipment:   Intra-op Plan:   Post-operative Plan:   Informed Consent: I have reviewed the patients History and Physical, chart, labs and discussed the procedure including the risks, benefits and alternatives for the proposed anesthesia with the patient or authorized representative who has indicated his/her understanding and acceptance.   Dental advisory given  Plan Discussed with: CRNA and Surgeon  Anesthesia Plan Comments:         Anesthesia Quick Evaluation

## 2012-01-08 NOTE — Anesthesia Postprocedure Evaluation (Signed)
  Anesthesia Post-op Note  Patient: Dylan [name]  Procedure(s) Performed: Procedure(s) (LRB): COLONOSCOPY WITH PROPOFOL (N/A) HOT HEMOSTASIS (ARGON PLASMA COAGULATION/BICAP) (N/A)  Patient Location: PACU  Anesthesia Type: MAC  Level of Consciousness: oriented and sedated  Airway and Oxygen Therapy: Patient Spontanous Breathing  Post-op Pain: mild  Post-op Assessment: Post-op Vital signs reviewed, Patient's Cardiovascular Status Stable, Respiratory Function Stable and Patent Airway  Post-op Vital Signs: stable  Complications: No apparent anesthesia complications

## 2012-01-09 ENCOUNTER — Encounter (HOSPITAL_COMMUNITY): Payer: Self-pay | Admitting: Gastroenterology

## 2012-03-02 ENCOUNTER — Emergency Department (HOSPITAL_COMMUNITY)
Admission: EM | Admit: 2012-03-02 | Discharge: 2012-03-02 | Disposition: A | Discharge status: Home / Self Care | Payer: Medicare Other | Attending: Emergency Medicine | Admitting: Emergency Medicine

## 2012-03-02 ENCOUNTER — Encounter (HOSPITAL_COMMUNITY): Payer: Self-pay | Admitting: *Deleted

## 2012-03-02 DIAGNOSIS — S41109A Unspecified open wound of unspecified upper arm, initial encounter: Secondary | ICD-10-CM | POA: Insufficient documentation

## 2012-03-02 DIAGNOSIS — I10 Essential (primary) hypertension: Secondary | ICD-10-CM | POA: Insufficient documentation

## 2012-03-02 DIAGNOSIS — IMO0002 Reserved for concepts with insufficient information to code with codable children: Secondary | ICD-10-CM | POA: Insufficient documentation

## 2012-03-02 DIAGNOSIS — S41119A Laceration without foreign body of unspecified upper arm, initial encounter: Secondary | ICD-10-CM

## 2012-03-02 DIAGNOSIS — W010XXA Fall on same level from slipping, tripping and stumbling without subsequent striking against object, initial encounter: Secondary | ICD-10-CM | POA: Insufficient documentation

## 2012-03-02 DIAGNOSIS — Z79899 Other long term (current) drug therapy: Secondary | ICD-10-CM | POA: Insufficient documentation

## 2012-03-02 MED ORDER — TETANUS-DIPHTH-ACELL PERTUSSIS 5-2.5-18.5 LF-MCG/0.5 IM SUSP
0.5000 mL | Freq: Once | INTRAMUSCULAR | Status: AC
  Administered 2012-03-02: 0.5 mL via INTRAMUSCULAR
  Filled 2012-03-02: qty 0.5

## 2012-03-02 NOTE — ED Notes (Signed)
Pt was ambulated with pulse ox by Kendal Hymen EMT- EMT reports unsteady gait with ambulation family and pt reports this is baseline- EDPA in to re-evalaute this pt=- EDP Jeraldine Loots updated on pt current status.  OK for discharge

## 2012-03-02 NOTE — ED Provider Notes (Signed)
History     CSN: [id number]  Arrival date & time 03/02/12  2000   First MD Initiated Contact with Patient 03/02/12 2021      Chief Complaint  Patient presents with  . Extremity Laceration    (Consider location/radiation/quality/duration/timing/severity/associated sxs/prior treatment) HPI Comments: Patient here with right forearm laceration s/p tripping in his garden and falling on the metal stake he was holding around 11:30 am earlier today. States he was not feeling lightheaded or dizzy prior to fall. Denies hitting his head. Fall was not witnessed.Covered the wound and went to a family party where his daughter-in-law told him he needed to go to the ER. Rates pain 5/10. No aggravating factors and has not tried anything to alleviate pain. Denies any numbness or tingling in extremity. Unsure of when last tetanus shot was, but believes it was many years ago. Son says he was bleeding a good amount and felt a little dizzy, but has felt fine for the past few hours. Takes 81mg  aspirin daily. Also scraped his left lower leg on the grass but was not bleeding.   The history is provided by the patient and a relative.    Past Medical History  Diagnosis Date  . Hypertension     Past Surgical History  Procedure Date  . Appendectomy 1956  . Hernia repair 1952  . Back surgery 2008  . Replacement total knee 2010  . Cardiac catheterization 1995  . Hot hemostasis 01/08/2012    Procedure: HOT HEMOSTASIS (ARGON PLASMA COAGULATION/BICAP);  Surgeon: Willis Modena, MD;  Location: [facility];  Service: Endoscopy;  Laterality: N/A;    History reviewed. No pertinent family history.  History  Substance Use Topics  . Smoking status: Former Smoker -- 1.0 packs/day    Types: Cigarettes    Quit date: 01/07/1992  . Smokeless tobacco: Not on file  . Alcohol Use: 1.2 oz/week    2 Glasses of wine per week      Review of Systems  Constitutional: Negative for activity change.  Respiratory: Negative  for shortness of breath.   Cardiovascular: Negative for chest pain.  Skin: Positive for wound.  Neurological: Positive for dizziness (resolved prior to coming to ED). Negative for syncope and numbness.  Hematological: Bruises/bleeds easily (ever since taking aspirin daily).    Allergies  Keflex  Home Medications   Current Outpatient Rx  Name Route Sig Dispense Refill  . ASPIRIN 81 MG PO TABS Oral Take 81 mg by mouth daily.    Marland Kitchen CARVEDILOL 12.5 MG PO TABS Oral Take 6.25 mg by mouth 1 day or 1 dose. 6.25    . VITAMIN D 1000 UNITS PO TABS Oral Take 2,000 Units by mouth daily.    . OMEGA-3 FATTY ACIDS 1000 MG PO CAPS Oral Take 2 g by mouth 2 (two) times daily.    Marland Kitchen HYDROCHLOROTHIAZIDE 12.5 MG PO CAPS Oral Take 12.5 mg by mouth daily.    Marland Kitchen METFORMIN HCL 500 MG PO TABS Oral Take 500 mg by mouth every morning.      BP 126/50  Pulse 92  Temp 97.5 F (36.4 C) (Oral)  Resp 20  SpO2 92%  Physical Exam  Nursing note and vitals reviewed. Constitutional: He is oriented to person, place, and time. He appears well-developed and well-nourished. No distress.  HENT:  Head: Normocephalic and atraumatic.  Eyes: Conjunctivae are normal.  Cardiovascular: Normal rate, regular rhythm and normal heart sounds.   Pulmonary/Chest: Effort normal and breath sounds normal.  Musculoskeletal: Normal  range of motion.  Neurological: He is alert and oriented to person, place, and time. No sensory deficit.  Skin: Abrasion (1 cm abrasion on left anterior leg. no active bleeding, edema, erythema, tenderness) and laceration (4 cm laceration on right anterior forearm) noted.    ED Course  Procedures (including critical care time) LACERATION REPAIR Performed by: Johnnette Gourd Authorized by: Johnnette Gourd Consent: Verbal consent obtained. Risks and benefits: risks, benefits and alternatives were discussed Consent given by: patient Patient identity confirmed: provided demographic data Prepped and Draped in  normal sterile fashion Wound explored  Laceration Location: right anterior forearm  Laceration Length: 4cm  No Foreign Bodies seen or palpated  Anesthesia: local infiltration  Local anesthetic: lidocaine 2% with epinephrine  Anesthetic total: 4 ml  Irrigation method: syringe Amount of cleaning: standard  Skin closure: 4-0 nylon  Number of sutures: 4  Technique: simple interrupted  Patient tolerance: Patient tolerated the procedure well with no immediate complications.  Labs Reviewed - No data to display No results found.   1. Laceration of arm       MDM  78 y/o male with 4 cm laceration of right forearm. No signs of distress, infection, foreign body. Suture repair performed without complications and tolerated well by patient. Tetanus booster given. Aware to f/u with pcp in 7 days for suture removal. Patient was also seen by Dr. Jeraldine Loots who agrees with current plan of care. 9:35 PM Discharge vitals with hypotension and 02 sat of 91-92%. Patient is asymptomatic. Patient and son state his bp is normally on the lower side. Discussed with Dr. Jeraldine Loots who feels patient is stable enough for discharge.       Trevor Mace, PA-C 03/02/12 2137

## 2012-03-02 NOTE — ED Notes (Signed)
telpha dressing and band aid applied

## 2012-03-02 NOTE — ED Provider Notes (Signed)
This patient was evaluated by a mid-level provider under my supervision.  I also saw and evaluated.  I was available during key portions of the procedure. My exam this elderly male who presents with a right forearm laceration was in no distress with no neurovascular deficits.  The patient was repaired with sutures and discharged in stable condition after a tetanus update  Gerhard Munch, MD 03/02/12 2345

## 2012-03-02 NOTE — ED Notes (Signed)
Pt c/o laceration on R medial FA, tripped and fell, cutting self on metal stake outside.

## 2012-03-07 ENCOUNTER — Ambulatory Visit (INDEPENDENT_AMBULATORY_CARE_PROVIDER_SITE_OTHER): Payer: Self-pay | Admitting: General Surgery

## 2012-03-26 ENCOUNTER — Ambulatory Visit (INDEPENDENT_AMBULATORY_CARE_PROVIDER_SITE_OTHER): Payer: Medicare Other | Admitting: General Surgery

## 2012-03-26 VITALS — BP 134/88 | HR 70 | Temp 97.6°F | Resp 16 | Ht 73.0 in | Wt 258.6 lb

## 2012-03-26 DIAGNOSIS — K635 Polyp of colon: Secondary | ICD-10-CM

## 2012-03-26 DIAGNOSIS — D126 Benign neoplasm of colon, unspecified: Secondary | ICD-10-CM

## 2012-03-26 NOTE — Progress Notes (Signed)
Subjective:   Dylan [name];lyp  Patient ID: Dylan [name], male   DOB: June 28, 1926, 78 y.o.   MRN: [id number]  HPI Patient is an 78 year old male kindly referred by Dr. Dulce Sellar for a cecal polyp. The patient initially had an abnormality discovered on colonoscopy by Dr. Madilyn Fireman in 2011. He found an apparent polyp somewhat ill-defined at the ileocecal valve. Biopsy revealed tubular adenoma. He underwent followup colonoscopy in 2012 with similar findings and again biopsy showing tubular adenoma. He felt that it was not necessarily completely evaluated and asked Dr. Dulce Sellar to perform colonoscopy in hospital setting this year. I have his report which indicates a carpet-like polyp adjacent to the ileocecal valve at least 2-1/2 cm in diameter. Also noted was a tiny polyp in the ascending Dylan not removed in a few diverticula.  Due to the size and appearance of the polyp had recent colonoscopy he is referred to consider resection. The patient has some long-standing chronic constipation but has no rectal bleeding or change in bowel habits or abdominal pain or other concerning symptoms.  Past Medical History  Diagnosis Date  . Hypertension    Past Surgical History  Procedure Date  . Appendectomy 1956  . Hernia repair 1952  . Back surgery 2008  . Replacement total knee 2010  . Cardiac catheterization 1995  . Hot hemostasis 01/08/2012    Procedure: HOT HEMOSTASIS (ARGON PLASMA COAGULATION/BICAP);  Surgeon: Willis Modena, MD;  Location: [facility];  Service: Endoscopy;  Laterality: N/A;   Current Outpatient Prescriptions  Medication Sig Dispense Refill  . aspirin 81 MG tablet Take 81 mg by mouth daily.      . carvedilol (COREG) 12.5 MG tablet Take 6.25 mg by mouth 1 day or 1 dose. 6.25      . cholecalciferol (VITAMIN D) 1000 UNITS tablet Take 2,000 Units by mouth daily.      . fish oil-omega-3 fatty acids 1000 MG capsule Take 2 g by mouth 2 (two) times daily.      . hydrochlorothiazide (MICROZIDE) 12.5 MG  capsule Take 12.5 mg by mouth daily.      . metFORMIN (GLUCOPHAGE) 500 MG tablet Take 500 mg by mouth every morning.       Allergies  Allergen Reactions  . Keflex (Cephalexin) Hives    1980's     Review of Systems  Constitutional: Negative.   Respiratory: Negative.   Cardiovascular: Negative.   Gastrointestinal: Positive for constipation. Negative for nausea, vomiting, abdominal pain, diarrhea, blood in stool and abdominal distention.  Musculoskeletal: Positive for back pain and arthralgias.       Objective:   Physical Exam General: Alert, moderately obese Caucasian male, in no distress, appears somewhat younger than stated age Skin: Warm and dry without rash or infection. HEENT: No palpable masses or thyromegaly. Sclera nonicteric. Pupils equal round and reactive. Oropharynx clear. Lymph nodes: No cervical, supraclavicular, or inguinal nodes palpable. Lungs: Breath sounds clear and equal without increased work of breathing Cardiovascular: Regular rate and rhythm without murmur. No JVD or edema. Peripheral pulses intact. Abdomen: Nondistended. Soft and nontender.  Moderately obese. No masses palpable. No organomegaly. No palpable hernias.  Well healed RLQ incision without hernia Extremities: No edema. Healed knee incisions with mild swelling, no deformity. No chronic venous stasis changes. Neurologic: Alert and fully oriented. Gait normal.     Assessment:     78 year old male with a carpet-like adenoma of the cecum near the ileocecal valve measuring at least 2.5 cm. I discussed options at length  with the patient and his wife. We discussed the nature of adenomatous polyps and that future behavior is not reliably predictable. We discussed that he could certainly live out the rest of his ears without this ever causing him any problems but he would be at some risk for progressing or even developing into an invasive cancer over the years. I discussed that I think we have options of  continued surveillance versus surgical resection. We discussed the nature of partial colectomy which we would plan to do laparoscopically including risks of anesthetic complications, bleeding, infection, anastomotic leakage and possible need for open surgery. Pros and cons of operative and nonoperative management were weighed and discussed.  At the end of our discussion he feels he would be much more comfortable proceeding with resection and I believe this is a reasonable course. We will therefore plan to schedule him for laparoscopic ileocecectomy following a bowel prep at home with a.m. admission.    Plan:     Laparoscopic ileocecostomy was a.m. admission.

## 2012-04-11 ENCOUNTER — Telehealth (INDEPENDENT_AMBULATORY_CARE_PROVIDER_SITE_OTHER): Payer: Self-pay | Admitting: General Surgery

## 2012-04-11 NOTE — Telephone Encounter (Signed)
Message copied by Mariella Saa on Fri Apr 11, 2012  1:04 PM ------      Message from: Maryan Puls      Created: Thu Apr 10, 2012  4:40 PM      Regarding: FW: Dr Johna Sheriff.      Contact: [phone]       Patient would like to speak with you regarding his surgery.            Maryan Puls      ----- Message -----         From: Erin Sons         Sent: 04/10/2012  11:39 AM           To: Maryan Puls, CMA      Subject: Dr Johna Sheriff.                                             Pt having sx 05/14/12 and has questions and second thoughts about sx. Want to talk to Dr Johna Sheriff about this.            Thanks

## 2012-04-11 NOTE — Telephone Encounter (Signed)
Tried to call pt on both phones, no answer, left message

## 2012-04-12 ENCOUNTER — Telehealth (INDEPENDENT_AMBULATORY_CARE_PROVIDER_SITE_OTHER): Payer: Self-pay | Admitting: General Surgery

## 2012-04-12 NOTE — Telephone Encounter (Signed)
Call the patient and all his questions were answered. He would like to postpone surgery until November which I think would be okay

## 2012-04-12 NOTE — Telephone Encounter (Signed)
Message copied by Mariella Saa on Sat Apr 12, 2012  7:18 PM ------      Message from: Maryan Puls      Created: Thu Apr 10, 2012  4:40 PM      Regarding: FW: Dr Johna Sheriff.      Contact: [phone]       Patient would like to speak with you regarding his surgery.            Maryan Puls      ----- Message -----         From: Erin Sons         Sent: 04/10/2012  11:39 AM           To: Maryan Puls, CMA      Subject: Dr Johna Sheriff.                                             Pt having sx 05/14/12 and has questions and second thoughts about sx. Want to talk to Dr Johna Sheriff about this.            Thanks

## 2012-05-14 ENCOUNTER — Inpatient Hospital Stay (HOSPITAL_COMMUNITY): Admission: RE | Admit: 2012-05-14 | Payer: Medicare Other | Source: Ambulatory Visit | Admitting: General Surgery

## 2012-05-14 ENCOUNTER — Encounter (HOSPITAL_COMMUNITY): Admission: RE | Payer: Self-pay | Source: Ambulatory Visit

## 2012-05-14 SURGERY — EXCISION, CECUM WITH ILEUM, LAPAROSCOPIC
Anesthesia: General

## 2012-05-23 ENCOUNTER — Encounter (INDEPENDENT_AMBULATORY_CARE_PROVIDER_SITE_OTHER): Payer: Self-pay | Admitting: General Surgery

## 2012-06-26 NOTE — Progress Notes (Signed)
Patient is scheduled for surgery on 07/09/12.  Need orders in EPIC .  Thank You.

## 2012-07-03 ENCOUNTER — Encounter (HOSPITAL_COMMUNITY): Payer: Self-pay | Admitting: Pharmacy Technician

## 2012-07-04 ENCOUNTER — Ambulatory Visit (HOSPITAL_COMMUNITY)
Admission: RE | Admit: 2012-07-04 | Discharge: 2012-07-04 | Disposition: A | Discharge status: Home / Self Care | Payer: Medicare Other | Source: Ambulatory Visit | Attending: General Surgery | Admitting: General Surgery

## 2012-07-04 ENCOUNTER — Encounter (HOSPITAL_COMMUNITY)
Admission: RE | Admit: 2012-07-04 | Discharge: 2012-07-04 | Disposition: A | Discharge status: Home / Self Care | Payer: Medicare Other | Source: Ambulatory Visit | Attending: General Surgery | Admitting: General Surgery

## 2012-07-04 ENCOUNTER — Encounter (HOSPITAL_COMMUNITY): Payer: Self-pay

## 2012-07-04 DIAGNOSIS — R059 Cough, unspecified: Secondary | ICD-10-CM | POA: Insufficient documentation

## 2012-07-04 DIAGNOSIS — R05 Cough: Secondary | ICD-10-CM | POA: Insufficient documentation

## 2012-07-04 DIAGNOSIS — D126 Benign neoplasm of colon, unspecified: Secondary | ICD-10-CM | POA: Insufficient documentation

## 2012-07-04 DIAGNOSIS — Z01812 Encounter for preprocedural laboratory examination: Secondary | ICD-10-CM | POA: Insufficient documentation

## 2012-07-04 LAB — BASIC METABOLIC PANEL
CO2: 29 mEq/L (ref 19–32)
Calcium: 9.2 mg/dL (ref 8.4–10.5)
GFR calc non Af Amer: 75 mL/min — ABNORMAL LOW (ref >90)
Potassium: 4.1 mEq/L (ref 3.5–5.1)
Sodium: 138 mEq/L (ref 135–145)

## 2012-07-04 LAB — CBC
MCH: 31.8 pg (ref 26.0–34.0)
Platelets: 134 10*3/uL — ABNORMAL LOW (ref 150–400)
RBC: 4.28 MIL/uL (ref 4.22–5.81)

## 2012-07-04 LAB — SURGICAL PCR SCREEN: MRSA, PCR: NEGATIVE

## 2012-07-04 NOTE — Patient Instructions (Addendum)
20 Dylan [name]  07/04/2012   Your procedure is scheduled on:  11-20 -2013  Report to [facility] at      0630  AM .  Call this number if you have problems the morning of surgery: [phone]  Or Presurgical Testing [phone](Roland Lipke)   Remember:   Do not eat food:After Midnight.             Follow bowel prep instructions as per office. Drink plenty of Clear Liquids,nothing past midnight.  Clear liquids include soda, tea, black coffee, apple or grape juice, broth.  Take these medicines the morning of surgery with A SIP OF WATER: Carvedilol.             Stop these meds until after surgery- Aspirin, fish oil,calcium with vitamin D, Vitamin B.   Do not wear jewelry, make-up or nail polish.  Do not wear lotions, powders, or perfumes. You may wear deodorant.  Do not shave 48 hours prior to surgery.(face and neck okay, no shaving of legs)  Do not bring valuables to the hospital.  Contacts, dentures or bridgework may not be worn into surgery.  Leave suitcase in the car. After surgery it may be brought to your room.  For patients admitted to the hospital, checkout time is 11:00 AM the day of discharge.   Patients discharged the day of surgery will not be allowed to drive home. Must have responsible person with you x 24 hours once discharged.  Name and phone number of your driver: Dylan [name], spouse [phone] cell  Special Instructions: CHG Shower Use Special Wash: see special instruction sheet..(avoid face and genitals)   Please read over the following fact sheets that you were given: MRSA Information, Blood Transfusion fact sheet, Incentive Spirometry Instruction.

## 2012-07-04 NOTE — Progress Notes (Signed)
07/04/12 1218  OBSTRUCTIVE SLEEP APNEA  Have you ever been diagnosed with sleep apnea through a sleep study? No  Do you snore loudly (loud enough to be heard through closed doors)?  0  Do you often feel tired, fatigued, or sleepy during the daytime? 0  Has anyone observed you stop breathing during your sleep? 0  Do you have, or are you being treated for high blood pressure? 1  BMI more than 35 kg/m2? 1  Age over 76 years old? 1  Neck circumference greater than 40 cm/18 inches? 0  Gender: 1  Obstructive Sleep Apnea Score 4

## 2012-07-08 ENCOUNTER — Other Ambulatory Visit (INDEPENDENT_AMBULATORY_CARE_PROVIDER_SITE_OTHER): Payer: Self-pay | Admitting: General Surgery

## 2012-07-08 NOTE — Anesthesia Preprocedure Evaluation (Addendum)
Anesthesia Evaluation  Patient identified by MRN, date of birth, ID band Patient awake    Reviewed: Allergy & Precautions, H&P , NPO status , Patient's Chart, lab work & pertinent test results, reviewed documented beta blocker date and time   Airway Mallampati: II TM Distance: >3 FB Neck ROM: full    Dental  (+) Missing and Dental Advisory Given,    Pulmonary neg pulmonary ROS,  breath sounds clear to auscultation  Pulmonary exam normal       Cardiovascular Exercise Tolerance: Good hypertension, Pt. on home beta blockers Rhythm:regular Rate:Normal     Neuro/Psych negative neurological ROS  negative psych ROS   GI/Hepatic negative GI ROS, Neg liver ROS,   Endo/Other  diabetes, Well Controlled, Type 2, Oral Hypoglycemic Agents  Renal/GU negative Renal ROS  negative genitourinary   Musculoskeletal   Abdominal   Peds  Hematology negative hematology ROS (+)   Anesthesia Other Findings   Reproductive/Obstetrics negative OB ROS                          Anesthesia Physical Anesthesia Plan  ASA: III  Anesthesia Plan: General   Post-op Pain Management:    Induction: Intravenous  Airway Management Planned: Oral ETT  Additional Equipment:   Intra-op Plan:   Post-operative Plan: Extubation in OR  Informed Consent: I have reviewed the patients History and Physical, chart, labs and discussed the procedure including the risks, benefits and alternatives for the proposed anesthesia with the patient or authorized representative who has indicated his/her understanding and acceptance.   Dental Advisory Given  Plan Discussed with: CRNA and Surgeon  Anesthesia Plan Comments:         Anesthesia Quick Evaluation

## 2012-07-08 NOTE — Progress Notes (Signed)
Spoke to [place] c dr Johna Sheriff office.informed her no orders in EPIC.  She will send message to md to do orders. States he is out of office today

## 2012-07-09 ENCOUNTER — Encounter (HOSPITAL_COMMUNITY): Payer: Self-pay | Admitting: Anesthesiology

## 2012-07-09 ENCOUNTER — Inpatient Hospital Stay (HOSPITAL_COMMUNITY)
Admission: RE | Admit: 2012-07-09 | Discharge: 2012-07-13 | DRG: 330 | Disposition: A | Discharge status: Home / Self Care | Payer: Medicare Other | Source: Ambulatory Visit | Attending: General Surgery | Admitting: General Surgery

## 2012-07-09 ENCOUNTER — Encounter (HOSPITAL_COMMUNITY): Payer: Self-pay | Admitting: *Deleted

## 2012-07-09 ENCOUNTER — Encounter (HOSPITAL_COMMUNITY)
Admission: RE | Disposition: A | Discharge status: Home / Self Care | Payer: Self-pay | Source: Ambulatory Visit | Attending: General Surgery

## 2012-07-09 ENCOUNTER — Ambulatory Visit (HOSPITAL_COMMUNITY): Payer: Medicare Other | Admitting: Anesthesiology

## 2012-07-09 DIAGNOSIS — D126 Benign neoplasm of colon, unspecified: Principal | ICD-10-CM | POA: Diagnosis present

## 2012-07-09 DIAGNOSIS — I10 Essential (primary) hypertension: Secondary | ICD-10-CM | POA: Diagnosis present

## 2012-07-09 DIAGNOSIS — K635 Polyp of colon: Secondary | ICD-10-CM

## 2012-07-09 DIAGNOSIS — E119 Type 2 diabetes mellitus without complications: Secondary | ICD-10-CM | POA: Diagnosis present

## 2012-07-09 DIAGNOSIS — K560 Paralytic ileus: Secondary | ICD-10-CM | POA: Diagnosis not present

## 2012-07-09 HISTORY — PX: COLON SURGERY: SHX602

## 2012-07-09 HISTORY — PX: LAPAROSCOPIC ILEOCECECTOMY: SHX5898

## 2012-07-09 LAB — GLUCOSE, CAPILLARY
Glucose-Capillary: 126 mg/dL — ABNORMAL HIGH (ref 70–99)
Glucose-Capillary: 110 mg/dL — ABNORMAL HIGH (ref 70–99)

## 2012-07-09 SURGERY — EXCISION, CECUM WITH ILEUM, LAPAROSCOPIC
Anesthesia: General | Site: Abdomen | Wound class: Clean Contaminated

## 2012-07-09 MED ORDER — HYDRALAZINE HCL 20 MG/ML IJ SOLN
10.0000 mg | Freq: Once | INTRAMUSCULAR | Status: AC
  Administered 2012-07-09: 10 mg via INTRAVENOUS

## 2012-07-09 MED ORDER — GLYCOPYRROLATE 0.2 MG/ML IJ SOLN
INTRAMUSCULAR | Status: DC | PRN
  Administered 2012-07-09: 0.2 mg via INTRAVENOUS
  Administered 2012-07-09: 0.6 mg via INTRAVENOUS

## 2012-07-09 MED ORDER — LACTATED RINGERS IR SOLN
Status: DC | PRN
  Administered 2012-07-09: 1000 mL

## 2012-07-09 MED ORDER — ACETAMINOPHEN 10 MG/ML IV SOLN
INTRAVENOUS | Status: DC | PRN
  Administered 2012-07-09: 1000 mg via INTRAVENOUS

## 2012-07-09 MED ORDER — HYDROCHLOROTHIAZIDE 12.5 MG PO CAPS
12.5000 mg | ORAL_CAPSULE | Freq: Every morning | ORAL | Status: DC
  Administered 2012-07-09 – 2012-07-13 (×5): 12.5 mg via ORAL
  Filled 2012-07-09 (×5): qty 1

## 2012-07-09 MED ORDER — CIPROFLOXACIN IN D5W 400 MG/200ML IV SOLN
400.0000 mg | INTRAVENOUS | Status: AC
  Administered 2012-07-09: 400 mg via INTRAVENOUS

## 2012-07-09 MED ORDER — OXYCODONE-ACETAMINOPHEN 5-325 MG PO TABS
1.0000 | ORAL_TABLET | ORAL | Status: DC | PRN
  Administered 2012-07-10 – 2012-07-12 (×5): 1 via ORAL
  Filled 2012-07-09 (×5): qty 1

## 2012-07-09 MED ORDER — BUPIVACAINE-EPINEPHRINE PF 0.5-1:200000 % IJ SOLN
INTRAMUSCULAR | Status: DC | PRN
  Administered 2012-07-09: 30 mL

## 2012-07-09 MED ORDER — HYDROMORPHONE HCL PF 1 MG/ML IJ SOLN
0.2500 mg | INTRAMUSCULAR | Status: DC | PRN
  Administered 2012-07-09 (×2): 0.25 mg via INTRAVENOUS

## 2012-07-09 MED ORDER — FENTANYL CITRATE 0.05 MG/ML IJ SOLN
INTRAMUSCULAR | Status: DC | PRN
  Administered 2012-07-09: 50 ug via INTRAVENOUS
  Administered 2012-07-09 (×2): 100 ug via INTRAVENOUS
  Administered 2012-07-09: 50 ug via INTRAVENOUS
  Administered 2012-07-09: 100 ug via INTRAVENOUS
  Administered 2012-07-09: 50 ug via INTRAVENOUS
  Administered 2012-07-09: 100 ug via INTRAVENOUS
  Administered 2012-07-09: 50 ug via INTRAVENOUS

## 2012-07-09 MED ORDER — MORPHINE SULFATE 2 MG/ML IJ SOLN
2.0000 mg | INTRAMUSCULAR | Status: DC | PRN
  Administered 2012-07-09 – 2012-07-10 (×7): 2 mg via INTRAVENOUS
  Filled 2012-07-09 (×7): qty 1

## 2012-07-09 MED ORDER — LACTATED RINGERS IV SOLN
INTRAVENOUS | Status: DC | PRN
  Administered 2012-07-09 (×3): via INTRAVENOUS

## 2012-07-09 MED ORDER — PROPOFOL 10 MG/ML IV BOLUS
INTRAVENOUS | Status: DC | PRN
  Administered 2012-07-09: 150 mg via INTRAVENOUS

## 2012-07-09 MED ORDER — POTASSIUM CHLORIDE IN NACL 20-0.9 MEQ/L-% IV SOLN
INTRAVENOUS | Status: DC
  Administered 2012-07-09 – 2012-07-13 (×8): via INTRAVENOUS
  Filled 2012-07-09 (×8): qty 1000

## 2012-07-09 MED ORDER — MORPHINE SULFATE 2 MG/ML IJ SOLN
INTRAMUSCULAR | Status: AC
  Filled 2012-07-09: qty 1

## 2012-07-09 MED ORDER — CIPROFLOXACIN IN D5W 400 MG/200ML IV SOLN
INTRAVENOUS | Status: AC
  Filled 2012-07-09: qty 200

## 2012-07-09 MED ORDER — HEPARIN SODIUM (PORCINE) 5000 UNIT/ML IJ SOLN
5000.0000 [IU] | Freq: Three times a day (TID) | INTRAMUSCULAR | Status: DC
  Administered 2012-07-10 – 2012-07-13 (×10): 5000 [IU] via SUBCUTANEOUS
  Filled 2012-07-09 (×13): qty 1

## 2012-07-09 MED ORDER — LACTATED RINGERS IV SOLN: INTRAVENOUS | Status: DC

## 2012-07-09 MED ORDER — NEOSTIGMINE METHYLSULFATE 1 MG/ML IJ SOLN
INTRAMUSCULAR | Status: DC | PRN
  Administered 2012-07-09: 4 mg via INTRAVENOUS

## 2012-07-09 MED ORDER — CHLORHEXIDINE GLUCONATE 4 % EX LIQD
1.0000 | Freq: Once | CUTANEOUS | Status: DC
Start: 2012-07-10 — End: 2012-07-09
  Filled 2012-07-09: qty 15

## 2012-07-09 MED ORDER — CISATRACURIUM BESYLATE (PF) 10 MG/5ML IV SOLN
INTRAVENOUS | Status: DC | PRN
  Administered 2012-07-09 (×4): 4 mg via INTRAVENOUS
  Administered 2012-07-09: 2 mg via INTRAVENOUS
  Administered 2012-07-09: 12 mg via INTRAVENOUS
  Administered 2012-07-09: 4 mg via INTRAVENOUS

## 2012-07-09 MED ORDER — BUPIVACAINE-EPINEPHRINE (PF) 0.5% -1:200000 IJ SOLN
INTRAMUSCULAR | Status: AC
  Filled 2012-07-09: qty 10

## 2012-07-09 MED ORDER — HYDROMORPHONE HCL PF 1 MG/ML IJ SOLN
INTRAMUSCULAR | Status: AC
  Filled 2012-07-09: qty 1

## 2012-07-09 MED ORDER — ACETAMINOPHEN 10 MG/ML IV SOLN
INTRAVENOUS | Status: AC
  Filled 2012-07-09: qty 100

## 2012-07-09 MED ORDER — METRONIDAZOLE IN NACL 5-0.79 MG/ML-% IV SOLN
500.0000 mg | Freq: Once | INTRAVENOUS | Status: AC
  Administered 2012-07-09: 500 mg via INTRAVENOUS

## 2012-07-09 MED ORDER — HYDRALAZINE HCL 20 MG/ML IJ SOLN
INTRAMUSCULAR | Status: AC
  Filled 2012-07-09: qty 1

## 2012-07-09 MED ORDER — HYDRALAZINE HCL 20 MG/ML IJ SOLN: 10.0000 mg | INTRAMUSCULAR | Status: DC | PRN

## 2012-07-09 MED ORDER — ONDANSETRON HCL 4 MG/2ML IJ SOLN
4.0000 mg | Freq: Four times a day (QID) | INTRAMUSCULAR | Status: DC | PRN
  Administered 2012-07-11: 4 mg via INTRAVENOUS
  Filled 2012-07-09: qty 2

## 2012-07-09 MED ORDER — ONDANSETRON HCL 4 MG/2ML IJ SOLN
INTRAMUSCULAR | Status: DC | PRN
  Administered 2012-07-09: 4 mg via INTRAVENOUS

## 2012-07-09 MED ORDER — CARVEDILOL 6.25 MG PO TABS
6.2500 mg | ORAL_TABLET | Freq: Every morning | ORAL | Status: DC
  Administered 2012-07-10 – 2012-07-13 (×4): 6.25 mg via ORAL
  Filled 2012-07-09 (×4): qty 1

## 2012-07-09 MED ORDER — ONDANSETRON HCL 4 MG PO TABS: 4.0000 mg | ORAL_TABLET | Freq: Four times a day (QID) | ORAL | Status: DC | PRN

## 2012-07-09 MED ORDER — INSULIN ASPART 100 UNIT/ML ~~LOC~~ SOLN
0.0000 [IU] | SUBCUTANEOUS | Status: DC
  Administered 2012-07-09 – 2012-07-11 (×5): 1 [IU] via SUBCUTANEOUS
  Administered 2012-07-11: 2 [IU] via SUBCUTANEOUS
  Administered 2012-07-11: 1 [IU] via SUBCUTANEOUS

## 2012-07-09 MED ORDER — LIDOCAINE HCL (CARDIAC) 20 MG/ML IV SOLN
INTRAVENOUS | Status: DC | PRN
  Administered 2012-07-09: 50 mg via INTRAVENOUS

## 2012-07-09 MED ORDER — METRONIDAZOLE IN NACL 5-0.79 MG/ML-% IV SOLN
INTRAVENOUS | Status: AC
  Filled 2012-07-09: qty 100

## 2012-07-09 MED ORDER — SODIUM CHLORIDE 0.9 % IV SOLN
INTRAVENOUS | Status: DC | PRN
  Administered 2012-07-09: 10:00:00 via INTRAVENOUS

## 2012-07-09 SURGICAL SUPPLY — 86 items
APPLIER CLIP 5 13 M/L LIGAMAX5 (MISCELLANEOUS)
APPLIER CLIP ROT 10 11.4 M/L (STAPLE)
APR CLP MED LRG 11.4X10 (STAPLE)
APR CLP MED LRG 5 ANG JAW (MISCELLANEOUS)
BLADE EXTENDED COATED 6.5IN (ELECTRODE) ×1 IMPLANT
BLADE HEX COATED 2.75 (ELECTRODE) ×2 IMPLANT
BLADE SURG ROTATE 9660 (MISCELLANEOUS) ×1 IMPLANT
BLADE SURG SZ10 CARB STEEL (BLADE) ×2 IMPLANT
CABLE HIGH FREQUENCY MONO STRZ (ELECTRODE) ×2 IMPLANT
CANISTER SUCTION 2500CC (MISCELLANEOUS) ×2 IMPLANT
CELLS DAT CNTRL 66122 CELL SVR (MISCELLANEOUS) ×1 IMPLANT
CHLORAPREP W/TINT 26ML (MISCELLANEOUS) ×2 IMPLANT
CLIP APPLIE 5 13 M/L LIGAMAX5 (MISCELLANEOUS) ×1 IMPLANT
CLIP APPLIE ROT 10 11.4 M/L (STAPLE) ×1 IMPLANT
CLOTH BEACON ORANGE TIMEOUT ST (SAFETY) ×2 IMPLANT
COVER MAYO STAND STRL (DRAPES) ×2 IMPLANT
DECANTER SPIKE VIAL GLASS SM (MISCELLANEOUS) ×2 IMPLANT
DRAPE CAMERA CLOSED 9X96 (DRAPES) ×2 IMPLANT
DRAPE LAPAROSCOPIC ABDOMINAL (DRAPES) ×2 IMPLANT
DRAPE LG THREE QUARTER DISP (DRAPES) ×2 IMPLANT
DRAPE WARM FLUID 44X44 (DRAPE) ×4 IMPLANT
ELECT REM PT RETURN 9FT ADLT (ELECTROSURGICAL) ×2
ELECTRODE REM PT RTRN 9FT ADLT (ELECTROSURGICAL) ×1 IMPLANT
GLOVE BIOGEL PI IND STRL 7.0 (GLOVE) ×1 IMPLANT
GLOVE BIOGEL PI IND STRL 7.5 (GLOVE) IMPLANT
GLOVE BIOGEL PI INDICATOR 7.0 (GLOVE) ×2
GLOVE BIOGEL PI INDICATOR 7.5 (GLOVE) ×1
GLOVE ECLIPSE 6.5 STRL STRAW (GLOVE) ×2 IMPLANT
GLOVE ECLIPSE 8.0 STRL XLNG CF (GLOVE) ×4 IMPLANT
GLOVE INDICATOR 8.0 STRL GRN (GLOVE) ×3 IMPLANT
GLOVE SS BIOGEL STRL SZ 8 (GLOVE) IMPLANT
GLOVE SUPERSENSE BIOGEL SZ 8 (GLOVE) ×2
GLOVE SURG SS PI 7.0 STRL IVOR (GLOVE) ×2 IMPLANT
GOWN STRL NON-REIN LRG LVL3 (GOWN DISPOSABLE) ×3 IMPLANT
GOWN STRL REIN XL XLG (GOWN DISPOSABLE) ×4 IMPLANT
HAND ACTIVATED (MISCELLANEOUS) IMPLANT
KIT BASIN OR (CUSTOM PROCEDURE TRAY) ×2 IMPLANT
LIGASURE IMPACT 36 18CM CVD LR (INSTRUMENTS) IMPLANT
NS IRRIG 1000ML POUR BTL (IV SOLUTION) ×2 IMPLANT
PACK GENERAL/GYN (CUSTOM PROCEDURE TRAY) ×2 IMPLANT
PENCIL BUTTON HOLSTER BLD 10FT (ELECTRODE) ×2 IMPLANT
RELOAD PROXIMATE 75MM BLUE (ENDOMECHANICALS) ×4 IMPLANT
RELOAD STAPLE 75 3.8 BLU REG (ENDOMECHANICALS) IMPLANT
RETRACTOR WND ALEXIS 18 MED (MISCELLANEOUS) IMPLANT
RTRCTR WOUND ALEXIS 18CM MED (MISCELLANEOUS) ×2
SCISSORS LAP 5X35 DISP (ENDOMECHANICALS) ×1 IMPLANT
SCISSORS LAP 5X45 EPIX DISP (ENDOMECHANICALS) ×1 IMPLANT
SEALER TISSUE G2 CVD JAW 35 (ENDOMECHANICALS) IMPLANT
SEALER TISSUE G2 CVD JAW 45CM (ENDOMECHANICALS)
SET IRRIG TUBING LAPAROSCOPIC (IRRIGATION / IRRIGATOR) ×2 IMPLANT
SHEARS CURVED HARMONIC AC 45CM (MISCELLANEOUS) ×1 IMPLANT
SLEEVE ADV FIXATION 5X100MM (TROCAR) ×1 IMPLANT
SOLUTION ANTI FOG 6CC (MISCELLANEOUS) ×2 IMPLANT
SPONGE GAUZE 4X4 12PLY (GAUZE/BANDAGES/DRESSINGS) ×2 IMPLANT
SPONGE LAP 18X18 X RAY DECT (DISPOSABLE) ×3 IMPLANT
STAPLER PROXIMATE 75MM BLUE (STAPLE) ×1 IMPLANT
STAPLER VISISTAT 35W (STAPLE) ×2 IMPLANT
SUCTION POOLE TIP (SUCTIONS) ×2 IMPLANT
SUT CHROMIC 3 0 SH 27 (SUTURE) ×2 IMPLANT
SUT NOVA NAB DX-16 0-1 5-0 T12 (SUTURE) ×2 IMPLANT
SUT PDS AB 1 CTX 36 (SUTURE) ×2 IMPLANT
SUT PDS AB 1 TP1 96 (SUTURE) IMPLANT
SUT PROLENE 2 0 KS (SUTURE) IMPLANT
SUT SILK 2 0 (SUTURE) ×4
SUT SILK 2 0 SH CR/8 (SUTURE) ×3 IMPLANT
SUT SILK 2-0 18XBRD TIE 12 (SUTURE) ×1 IMPLANT
SUT SILK 3 0 (SUTURE) ×2
SUT SILK 3 0 SH CR/8 (SUTURE) ×2 IMPLANT
SUT SILK 3-0 18XBRD TIE 12 (SUTURE) ×1 IMPLANT
SUT VIC AB 2-0 SH 18 (SUTURE) ×2 IMPLANT
SUT VICRYL 2 0 18  UND BR (SUTURE)
SUT VICRYL 2 0 18 UND BR (SUTURE) ×2 IMPLANT
SYS LAPSCP GELPORT 120MM (MISCELLANEOUS)
SYSTEM LAPSCP GELPORT 120MM (MISCELLANEOUS) IMPLANT
TAPE CLOTH SURG 4X10 WHT LF (GAUZE/BANDAGES/DRESSINGS) ×1 IMPLANT
TOWEL OR 17X26 10 PK STRL BLUE (TOWEL DISPOSABLE) ×4 IMPLANT
TRAY FOLEY CATH 14FRSI W/METER (CATHETERS) ×2 IMPLANT
TRAY LAP CHOLE (CUSTOM PROCEDURE TRAY) ×2 IMPLANT
TROCAR ADV FIXATION 12X100MM (TROCAR) ×1 IMPLANT
TROCAR ADV FIXATION 5X100MM (TROCAR) ×1 IMPLANT
TROCAR Z-THREAD FIOS 11X100 BL (TROCAR) ×2 IMPLANT
TROCAR Z-THREAD FIOS 5X100MM (TROCAR) ×4 IMPLANT
TROCAR Z-THREAD SLEEVE 11X100 (TROCAR) IMPLANT
TUBING FILTER THERMOFLATOR (ELECTROSURGICAL) ×2 IMPLANT
YANKAUER SUCT BULB TIP 10FT TU (MISCELLANEOUS) ×2 IMPLANT
YANKAUER SUCT BULB TIP NO VENT (SUCTIONS) ×2 IMPLANT

## 2012-07-09 NOTE — Addendum Note (Signed)
Addendum  created 07/09/12 1237 by Gaetano Hawthorne, MD   Modules edited:Orders

## 2012-07-09 NOTE — Transfer of Care (Signed)
Immediate Anesthesia Transfer of Care Note  Patient: Dylan [name]  Procedure(s) Performed: Procedure(s) (LRB) with comments: LAPAROSCOPIC ILEOCECECTOMY (N/A) - Laparoscopic Ileocecectomy  Patient Location: PACU  Anesthesia Type:General  Level of Consciousness: awake and oriented  Airway & Oxygen Therapy: Patient Spontanous Breathing and Patient connected to face mask oxygen  Post-op Assessment: Report given to PACU RN, Post -op Vital signs reviewed and stable and Patient moving all extremities X 4  Post vital signs: Reviewed and stable  Complications: No apparent anesthesia complications

## 2012-07-09 NOTE — Progress Notes (Signed)
Apresoline repeated for elevated blood pressures.

## 2012-07-09 NOTE — Preoperative (Signed)
Beta Blockers   Reason not to administer Beta Blockers:Took his Coreg this am.

## 2012-07-09 NOTE — Anesthesia Postprocedure Evaluation (Signed)
  Anesthesia Post-op Note  Patient: Dylan [name]  Procedure(s) Performed: Procedure(s) (LRB): LAPAROSCOPIC ILEOCECECTOMY (N/A)  Patient Location: PACU  Anesthesia Type: General  Level of Consciousness: awake and alert   Airway and Oxygen Therapy: Patient Spontanous Breathing  Post-op Pain: mild  Post-op Assessment: Post-op Vital signs reviewed, Patient's Cardiovascular Status Stable, Respiratory Function Stable, Patent Airway and No signs of Nausea or vomiting  Post-op Vital Signs: stable  Complications: No apparent anesthesia complications

## 2012-07-09 NOTE — Op Note (Signed)
Preoperative Diagnosis: cecal polyp  Postoprative Diagnosis: cecal polyp  Procedure: Procedure(s): LAPAROSCOPIC ILEOCECECTOMY   Surgeon: Mariella Saa   Assistants: Harriette Bouillon  Anesthesia:  General endotracheal anesthesiaDiagnos  Indications:   Patient is an 78 year old male who on followup colonoscopy he has had a persistent and enlarging carpet-like adenoma involving the ileocecal valve. This has not been felt to be amenable to endoscopic resection. We discussed options in detail discussed elsewhere and risks of surgery and have elected to proceed with laparoscopic ileocecectomy.  Procedure Detail:  Patient is brought to the operating room, placed in the supine position on the operating table, and general endotracheal anesthesia induced. Foley catheter was placed. The abdomen was widely sterilely prepped and draped. He had received preoperative IV antibiotics. Patient timeout was performed the correct procedure verified. Access was obtained with an open Hassan 10 mm trocar just above the umbilicus and pneumoperitoneum established. Laparoscopy showed extensive omental adhesions to the right lateral abdomen and right lower quadrant as well as down into the pelvis. The patient had a previous history of appendectomy. Under direct vision a 12 mm trocar was placed in the left lower quadrant and a 5 mm trocar in the left upper quadrant. An extensive adhesio lysis was then performed with the harmonic scalpel mobilizing the omentum off of the anterior and lateral lower abdominal wall. Eventually I was able to mobilize the omentum up completely out of the right lower quadrant and off of the right lateral sidewall and anterior abdominal wall. There were still some adhesions down to the left lower quadrant into the sigmoid colon but these did not appear to be in the way of our further dissection I left these intact. There were no bowel adhesions to the omentum. However on exposing the cecum it  was clear that the cecum and terminal ileum were fairly densely adherent to the lateral dome of the wall as well. I therefore initially proceeded with a lateral to medial dissection mobilizing adhesions from the ileum and cecum laterally with sharp and harmonic dissection. As we progressed I then continued up the descending colon in normal tissue planes mobilizing the descending colon medially and then placing the patient in steep reverse Trendelenburg the hepatic flexure was mobilized dividing the hepatocolic ligament with the harmonic scalpel and completely sweeping the hepatic flexure medially and inferiorly protecting the duodenum. We then came back down inferiorly further mobilized the right colon and further mobilizing mesenteric adhesions of the terminal ileum and cecum medially. At this point I could expose the medial aspect of the mesentery of the terminal ileum and cecum from below and the peritoneum here was incised with careful blunt dissection I entered a nice retroperitoneal plane dissecting the mesentery of the ileum and cecum up out of the retroperitoneum protecting retroperitoneal structures. At this point we felt we had adequate mobilization. The small incision just above the umbilicus was lengthened superiorly to a total of 6 cm. A wound protector was placed. We were then able to exteriorize the terminal ileum cecum and right colon with good visualization. Points of resection at the terminal ileum and mid right colon were chosen. The bowel at either side was divided with a single firing of the GIA 75 mm stapler. The mesentery of the involved segment was then taken between [facility] clamps and tied with 2-0 silk ties and the specimen removed. Following this a functional end-to-end anastomosis was created between the terminal ileum and mid right colon with a single firing of the GIA 75  mm stapler. There was no bleeding from the staple line. The common enterotomy was closed in 2 layers with initially  running 2-0 chromic from either end tied centrally and then an outer seromuscular layer of in her up to 2 silks. The crotch of the anastomosis was reinforced with 2-0 silk. It appeared to be widely patent with good blood supply and under no tension. The bowel was returned to the abdominal cavity. The wound protector was removed and all instruments and gloves changed. The soft tissue was infiltrated with Marcaine. The fascia was closed with interrupted #1 Novafil sutures. The skin in the midline was closed with staples. I then examined the abdomen laparoscopically and the anastomosis was lying nicely without torsion or tension and there was no visible bleeding or injury or other problems. All CO2 was evacuated and trochars removed. The laparoscopic sites were closed with staples. Sponge needle and instrument counts were correct.  Findings: Extensive omental adhesions in the pelvis and right lower quadrant  Estimated Blood Loss:  less than 50 mL         Drains: none  Blood Given: none          Specimens: terminal ileum, cecum and portion of right colon        Complications:  * No complications entered in OR log *         Disposition: PACU - hemodynamically stable.         Condition: stable  Mariella Saa MD, FACS  07/09/2012, 12:17 PM

## 2012-07-09 NOTE — H&P (Signed)
HPI  Patient is an 78 year old male kindly referred by Dr. Dulce Sellar for a cecal polyp. The patient initially had an abnormality discovered on colonoscopy by Dr. Madilyn Fireman in 2011. He found an apparent polyp somewhat ill-defined at the ileocecal valve. Biopsy revealed tubular adenoma. He underwent followup colonoscopy in 2012 with similar findings and again biopsy showing tubular adenoma. He felt that it was not necessarily completely evaluated and asked Dr. Dulce Sellar to perform colonoscopy in hospital setting this year. I have his report which indicates a carpet-like polyp adjacent to the ileocecal valve at least 2-1/2 cm in diameter. Also noted was a tiny polyp in the ascending colon not removed in a few diverticula. Due to the size and appearance of the polyp had recent colonoscopy he was referred to consider resection. The patient has some long-standing chronic constipation but has no rectal bleeding or change in bowel habits or abdominal pain or other concerning symptoms. He is now admitted for laparoscopic ileocecectomy Past Medical History   Diagnosis  Date   .  Hypertension     Past Surgical History   Procedure  Date   .  Appendectomy  1956   .  Hernia repair  1952   .  Back surgery  2008   .  Replacement total knee  2010   .  Cardiac catheterization  1995   .  Hot hemostasis  01/08/2012     Procedure: HOT HEMOSTASIS (ARGON PLASMA COAGULATION/BICAP); Surgeon: Willis Modena, MD; Location: [facility]; Service: Endoscopy; Laterality: N/A;    Current Outpatient Prescriptions   Medication  Sig  Dispense  Refill   .  aspirin 81 MG tablet  Take 81 mg by mouth daily.     .  carvedilol (COREG) 12.5 MG tablet  Take 6.25 mg by mouth 1 day or 1 dose. 6.25     .  cholecalciferol (VITAMIN D) 1000 UNITS tablet  Take 2,000 Units by mouth daily.     .  fish oil-omega-3 fatty acids 1000 MG capsule  Take 2 g by mouth 2 (two) times daily.     .  hydrochlorothiazide (MICROZIDE) 12.5 MG capsule  Take 12.5 mg by  mouth daily.     .  metFORMIN (GLUCOPHAGE) 500 MG tablet  Take 500 mg by mouth every morning.      Allergies   Allergen  Reactions   .  Keflex (Cephalexin)  Hives     1980's    Review of Systems  Constitutional: Negative.  Respiratory: Negative.  Cardiovascular: Negative.  Gastrointestinal: Positive for constipation. Negative for nausea, vomiting, abdominal pain, diarrhea, blood in stool and abdominal distention.  Musculoskeletal: Positive for back pain and arthralgias.    Objective:   Physical Exam  BP 124/74  Pulse 56  Temp 97 F (36.1 C)  Resp 20  SpO2 95%  General: Alert, moderately obese Caucasian male, in no distress, appears somewhat younger than stated age  Skin: Warm and dry without rash or infection.  HEENT: No palpable masses or thyromegaly. Sclera nonicteric. Pupils equal round and reactive. Oropharynx clear.  Lymph nodes: No cervical, supraclavicular, or inguinal nodes palpable.  Lungs: Breath sounds clear and equal without increased work of breathing  Cardiovascular: Regular rate and rhythm without murmur. No JVD or edema. Peripheral pulses intact.  Abdomen: Nondistended. Soft and nontender. Moderately obese. No masses palpable. No organomegaly. No palpable hernias. Well healed RLQ incision without hernia  Extremities: No edema. Healed knee incisions with mild swelling, no deformity. No chronic venous stasis  changes.  Neurologic: Alert and fully oriented. Gait normal.   Assessment:    78 year old male with a carpet-like adenoma of the cecum near the ileocecal valve measuring at least 2.5 cm. I discussed options at length with the patient and his wife. We discussed the nature of adenomatous polyps and that future behavior is not reliably predictable. We discussed that he could certainly live out the rest of his years without this ever causing him any problems but he would be at some risk for progressing or even developing into an invasive cancer over the years. I  discussed that I think we have options of continued surveillance versus surgical resection. We discussed the nature of partial colectomy which we would plan to do laparoscopically including risks of anesthetic complications, bleeding, infection, anastomotic leakage and possible need for open surgery. Pros and cons of operative and nonoperative management were weighed and discussed. He wishes to proceed with surgery   Plan:    Laparoscopic ileocecectomy  Mariella Saa MD, FACS  07/09/2012, 8:27 AM

## 2012-07-09 NOTE — Progress Notes (Signed)
Dr. Leta Jungling MADE AWARE OF PATIENT'S BLOOD PRESSURES BEING IN THE 150S- 160S SYSTOLIC- O.K. To go to floor

## 2012-07-09 NOTE — Progress Notes (Signed)
Dr. Leta Jungling made aware of patient's blood pressures- orders given Apresoline begun.

## 2012-07-10 LAB — GLUCOSE, CAPILLARY
Glucose-Capillary: 123 mg/dL — ABNORMAL HIGH (ref 70–99)
Glucose-Capillary: 101 mg/dL — ABNORMAL HIGH (ref 70–99)
Glucose-Capillary: 109 mg/dL — ABNORMAL HIGH (ref 70–99)
Glucose-Capillary: 125 mg/dL — ABNORMAL HIGH (ref 70–99)

## 2012-07-10 LAB — CBC
Platelets: 116 10*3/uL — ABNORMAL LOW (ref 150–400)
RBC: 3.86 MIL/uL — ABNORMAL LOW (ref 4.22–5.81)
WBC: 7.2 10*3/uL (ref 4.0–10.5)

## 2012-07-10 LAB — BASIC METABOLIC PANEL
CO2: 25 mEq/L (ref 19–32)
Calcium: 8.3 mg/dL — ABNORMAL LOW (ref 8.4–10.5)
Chloride: 103 mEq/L (ref 96–112)
GFR calc Af Amer: 89 mL/min — ABNORMAL LOW (ref >90)
Sodium: 137 mEq/L (ref 135–145)

## 2012-07-10 NOTE — Progress Notes (Signed)
Patient ID: Dylan [name], male   DOB: 1926-01-18, 78 y.o.   MRN: [id number] 1 Day Post-Op  Subjective: Some incisional pain, easily controlled with meds and better this AM.  Denies nausea or other C/O  Objective: Vital signs in last 24 hours: Temp:  [97.6 F (36.4 C)-98.3 F (36.8 C)] 98.3 F (36.8 C) (11/20 2215) Pulse Rate:  [59-79] 78  (11/20 2215) Resp:  [7-18] 18  (11/20 2215) BP: (121-191)/(57-109) 138/71 mmHg (11/20 2215) SpO2:  [90 %-100 %] 96 % (11/20 2215) Weight:  [259 lb (117.482 kg)] 259 lb (117.482 kg) (11/20 1506) Last BM Date: 07/08/12  Intake/Output from previous day: 11/20 0701 - 11/21 0700 In: 4261.7 [I.V.:4261.7] Out: 1950 [Urine:1950] Intake/Output this shift: Total I/O In: 1138.3 [I.V.:1138.3] Out: 1250 [Urine:1250]  General appearance: alert, cooperative and no distress Resp: clear to auscultation bilaterally GI: normal findings: soft, non-tender and non distended Incision/Wound: Dressings clean and dry  Lab Results:   [facility] 07/10/12 0423  WBC 7.2  HGB 12.4*  HCT 36.3*  PLT 116*   BMET  Basename 07/10/12 0423  NA 137  K 3.6  CL 103  CO2 25  GLUCOSE 107*  BUN 9  CREATININE 0.89  CALCIUM 8.3*     Studies/Results: No results found.  Anti-infectives: Anti-infectives     Start     Dose/Rate Route Frequency Ordered Stop   07/09/12 0830   metroNIDAZOLE (FLAGYL) IVPB 500 mg        500 mg 100 mL/hr over 60 Minutes Intravenous  Once 07/09/12 0823 07/09/12 0936   07/09/12 0715   ciprofloxacin (CIPRO) IVPB 400 mg        400 mg 200 mL/hr over 60 Minutes Intravenous On call to O.R. 07/09/12 0711 07/09/12 0830          Assessment/Plan: s/p Procedure(s): LAPAROSCOPIC ILEOCECECTOMY Doing well without evidence of early complication Start CL diet.  Ambulate   LOS: 1 day    Dontell Mian T 07/10/2012

## 2012-07-10 NOTE — Care Management Note (Signed)
    Page 1 of 1   07/10/2012     12:59:14 PM   CARE MANAGEMENT NOTE 07/10/2012  Patient:  Dylan [name], Dylan [name]   Account Number:  [id number]  Date Initiated:  07/10/2012  Documentation initiated by:  Lorenda Ishihara  Subjective/Objective Assessment:   78 yo male admitted s/p lap ileocecectomy. PTA lived at home with spouse.     Action/Plan:   Home when stable   Anticipated DC Date:  07/14/2012   Anticipated DC Plan:  HOME/SELF CARE      DC Planning Services  CM consult      Choice offered to / List presented to:             Status of service:  Completed, signed off Medicare Important Message given?   (If response is "NO", the following Medicare IM given date fields will be blank) Date Medicare IM given:   Date Additional Medicare IM given:    Discharge Disposition:  HOME/SELF CARE  Per UR Regulation:  Reviewed for med. necessity/level of care/duration of stay  If discussed at Long Length of Stay Meetings, dates discussed:    Comments:

## 2012-07-11 ENCOUNTER — Encounter (HOSPITAL_COMMUNITY): Payer: Self-pay | Admitting: General Surgery

## 2012-07-11 LAB — BASIC METABOLIC PANEL
BUN: 9 mg/dL (ref 6–23)
CO2: 27 mEq/L (ref 19–32)
Chloride: 96 mEq/L (ref 96–112)
Glucose, Bld: 137 mg/dL — ABNORMAL HIGH (ref 70–99)
Potassium: 3.6 mEq/L (ref 3.5–5.1)
Sodium: 134 mEq/L — ABNORMAL LOW (ref 135–145)

## 2012-07-11 LAB — CBC
HCT: 39.2 % (ref 39.0–52.0)
Hemoglobin: 13.5 g/dL (ref 13.0–17.0)
MCHC: 34.4 g/dL (ref 30.0–36.0)
RBC: 4.17 MIL/uL — ABNORMAL LOW (ref 4.22–5.81)

## 2012-07-11 LAB — GLUCOSE, CAPILLARY
Glucose-Capillary: 111 mg/dL — ABNORMAL HIGH (ref 70–99)
Glucose-Capillary: 121 mg/dL — ABNORMAL HIGH (ref 70–99)
Glucose-Capillary: 126 mg/dL — ABNORMAL HIGH (ref 70–99)
Glucose-Capillary: 128 mg/dL — ABNORMAL HIGH (ref 70–99)

## 2012-07-11 NOTE — Progress Notes (Signed)
Patient ID: Dylan [name], male   DOB: 11-06-1925, 78 y.o.   MRN: [id number] 2 Days Post-Op  Subjective: He has some incisional pain when coughing and moving. He has had some nausea without vomiting. Sipping on clear liquids. No flatus or bowel movement yet.  Objective: Vital signs in last 24 hours: Temp:  [96.8 F (36 C)-98.6 F (37 C)] 98.4 F (36.9 C) (11/22 0615) Pulse Rate:  [60-78] 78  (11/22 0615) Resp:  [16-18] 18  (11/22 0615) BP: (127-176)/(59-78) 171/78 mmHg (11/22 0615) SpO2:  [91 %-98 %] 98 % (11/22 0615) Last BM Date: 07/08/12  Intake/Output from previous day: 11/21 0701 - 11/22 0700 In: 2051.3 [P.O.:120; I.V.:1931.3] Out: 2350 [Urine:2350] Intake/Output this shift:    General appearance: alert, cooperative and no distress Resp: clear to auscultation bilaterally GI: normal findings: soft, non-tender and mildly distended Incision/Wound:dressings clean and dry  Lab Results:   Basename 07/11/12 0422 07/10/12 0423  WBC 10.1 7.2  HGB 13.5 12.4*  HCT 39.2 36.3*  PLT 119* 116*   BMET  Basename 07/11/12 0422 07/10/12 0423  NA 134* 137  K 3.6 3.6  CL 96 103  CO2 27 25  GLUCOSE 137* 107*  BUN 9 9  CREATININE 0.85 0.89  CALCIUM 8.9 8.3*     Studies/Results: No results found.  Anti-infectives: Anti-infectives     Start     Dose/Rate Route Frequency Ordered Stop   07/09/12 0830   metroNIDAZOLE (FLAGYL) IVPB 500 mg        500 mg 100 mL/hr over 60 Minutes Intravenous  Once 07/09/12 0823 07/09/12 0936   07/09/12 0715   ciprofloxacin (CIPRO) IVPB 400 mg        400 mg 200 mL/hr over 60 Minutes Intravenous On call to O.R. 07/09/12 0711 07/09/12 0830          Assessment/Plan: s/p Procedure(s): LAPAROSCOPIC ILEOCECECTOMY Stable postop. He likely has some degree of ileus. Will continue clear liquids only today. He has been up and ambulatory. Continue to increase activity   LOS: 2 days    Grey Schlauch T 07/11/2012

## 2012-07-12 LAB — GLUCOSE, CAPILLARY
Glucose-Capillary: 99 mg/dL (ref 70–99)
Glucose-Capillary: 115 mg/dL — ABNORMAL HIGH (ref 70–99)
Glucose-Capillary: 127 mg/dL — ABNORMAL HIGH (ref 70–99)

## 2012-07-12 MED ORDER — INSULIN ASPART 100 UNIT/ML ~~LOC~~ SOLN: 0.0000 [IU] | Freq: Every day | SUBCUTANEOUS | Status: DC

## 2012-07-12 MED ORDER — INSULIN ASPART 100 UNIT/ML ~~LOC~~ SOLN: 0.0000 [IU] | Freq: Three times a day (TID) | SUBCUTANEOUS | Status: DC

## 2012-07-12 NOTE — Progress Notes (Signed)
Patient ID: Dylan [name], male   DOB: 09/20/1925, 78 y.o.   MRN: [id number] [facility] Progress Note:   3 Days Post-Op  Subjective: Mental status is clear Objective: Vital signs in last 24 hours: Temp:  [97.6 F (36.4 C)-98.3 F (36.8 C)] 98.1 F (36.7 C) (11/23 0535) Pulse Rate:  [58-72] 59  (11/23 0535) Resp:  [18] 18  (11/23 0535) BP: (100-136)/(60-77) 118/69 mmHg (11/23 0535) SpO2:  [93 %-98 %] 98 % (11/23 0535)  Intake/Output from previous day: 11/22 0701 - 11/23 0700 In: 1858.8 [P.O.:240; I.V.:1618.8] Out: 3150 [Urine:3150] Intake/Output this shift:    Physical Exam: Work of breathing is not labored.  Incision bland.  Passing gas.  Taking clears  Lab Results:  Results for orders placed during the hospital encounter of 07/09/12 (from the past 48 hour(s))  GLUCOSE, CAPILLARY     Status: Abnormal   Collection Time   07/10/12 12:01 PM      Component Value Range Comment   Glucose-Capillary 109 (*) 70 - 99 mg/dL   GLUCOSE, CAPILLARY     Status: Abnormal   Collection Time   07/10/12  4:13 PM      Component Value Range Comment   Glucose-Capillary 125 (*) 70 - 99 mg/dL   GLUCOSE, CAPILLARY     Status: Abnormal   Collection Time   07/10/12  7:59 PM      Component Value Range Comment   Glucose-Capillary 102 (*) 70 - 99 mg/dL    Comment 1 Notify RN     GLUCOSE, CAPILLARY     Status: Abnormal   Collection Time   07/10/12 11:50 PM      Component Value Range Comment   Glucose-Capillary 121 (*) 70 - 99 mg/dL    Comment 1 Documented in Chart      Comment 2 Notify RN     GLUCOSE, CAPILLARY     Status: Abnormal   Collection Time   07/11/12  4:17 AM      Component Value Range Comment   Glucose-Capillary 128 (*) 70 - 99 mg/dL    Comment 1 Documented in Chart      Comment 2 Notify RN     CBC     Status: Abnormal   Collection Time   07/11/12  4:22 AM      Component Value Range Comment   WBC 10.1  4.0 - 10.5 K/uL    RBC 4.17 (*) 4.22 - 5.81 MIL/uL    Hemoglobin 13.5  13.0 - 17.0 g/dL    HCT 95.6  21.3 - 08.6 %    MCV 94.0  78.0 - 100.0 fL    MCH 32.4  26.0 - 34.0 pg    MCHC 34.4  30.0 - 36.0 g/dL    RDW 57.8  46.9 - 62.9 %    Platelets 119 (*) 150 - 400 K/uL CONSISTENT WITH PREVIOUS RESULT  BASIC METABOLIC PANEL     Status: Abnormal   Collection Time   07/11/12  4:22 AM      Component Value Range Comment   Sodium 134 (*) 135 - 145 mEq/L    Potassium 3.6  3.5 - 5.1 mEq/L    Chloride 96  96 - 112 mEq/L    CO2 27  19 - 32 mEq/L    Glucose, Bld 137 (*) 70 - 99 mg/dL    BUN 9  6 - 23 mg/dL    Creatinine, Ser 5.28  0.50 - 1.35 mg/dL  Calcium 8.9  8.4 - 10.5 mg/dL    GFR calc non Af Amer 78 (*) >90 mL/min    GFR calc Af Amer >90  >90 mL/min   GLUCOSE, CAPILLARY     Status: Abnormal   Collection Time   07/11/12  8:12 AM      Component Value Range Comment   Glucose-Capillary 143 (*) 70 - 99 mg/dL   GLUCOSE, CAPILLARY     Status: Abnormal   Collection Time   07/11/12 12:29 PM      Component Value Range Comment   Glucose-Capillary 126 (*) 70 - 99 mg/dL   GLUCOSE, CAPILLARY     Status: Abnormal   Collection Time   07/11/12  4:42 PM      Component Value Range Comment   Glucose-Capillary 117 (*) 70 - 99 mg/dL   GLUCOSE, CAPILLARY     Status: Abnormal   Collection Time   07/11/12  7:48 PM      Component Value Range Comment   Glucose-Capillary 113 (*) 70 - 99 mg/dL    Comment 1 Notify RN     GLUCOSE, CAPILLARY     Status: Abnormal   Collection Time   07/11/12 11:59 PM      Component Value Range Comment   Glucose-Capillary 111 (*) 70 - 99 mg/dL    Comment 1 Notify RN     GLUCOSE, CAPILLARY     Status: Abnormal   Collection Time   07/12/12  4:28 AM      Component Value Range Comment   Glucose-Capillary 106 (*) 70 - 99 mg/dL    Comment 1 Notify RN     GLUCOSE, CAPILLARY     Status: Normal   Collection Time   07/12/12  7:55 AM      Component Value Range Comment   Glucose-Capillary 99  70 - 99 mg/dL      Radiology/Results: No results found.  Anti-infectives: Anti-infectives     Start     Dose/Rate Route Frequency Ordered Stop   07/09/12 0830   metroNIDAZOLE (FLAGYL) IVPB 500 mg        500 mg 100 mL/hr over 60 Minutes Intravenous  Once 07/09/12 0823 07/09/12 0936   07/09/12 0715   ciprofloxacin (CIPRO) IVPB 400 mg        400 mg 200 mL/hr over 60 Minutes Intravenous On call to O.R. 07/09/12 0711 07/09/12 0830          Assessment/Plan: Problem List: There is no problem list on file for this patient.   Advance to full liquids 3 Days Post-Op    LOS: 3 days   Matt B. Daphine Deutscher, MD, [facility] [phone] beeper [phone]  07/12/2012 8:57 AM

## 2012-07-13 LAB — GLUCOSE, CAPILLARY: Glucose-Capillary: 106 mg/dL — ABNORMAL HIGH (ref 70–99)

## 2012-07-13 MED ORDER — HYDROCODONE-ACETAMINOPHEN 5-325 MG PO TABS: 1.0000 | ORAL_TABLET | ORAL | Status: DC | PRN

## 2012-07-13 NOTE — Discharge Summary (Signed)
  Patient ID: Dylan [name] [id number] 78 y.o. 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 Recent colonoscopy showed enlargement and it is not amenable to colonoscopic removal. After extensive discussion regarding options and risks detailed elsewhere he is admitted electively for laparoscopic ileocecectomy.  Hospital Course: following and antibiotic bowel prep at home the patient is admitted on the morning of this procedure. He underwent an uneventful laparoscopic ileocecectomy although he had extensive adhesions from a remote open appendectomy. His postoperative course was relatively smooth. He had some expected ileus for the first 48 hours but remained very stable. By the third day he was passing flatus and had a bowel movement and was advanced to a full liquid diet. On the day of admission he is afebrile with stable vital signs. He denies abdominal pain. He is tolerating his diet. Abdomen is soft flat and on tender and his incisions are healing without infection. Final pathology revealed a benign adenomatous polyp.  Consults: None  Disposition: Home  Patient Instructions:   [place], [place]  Home Medication Instructions WUJ:[id number]   Printed on:07/13/12 0857  Medication Information                    hydrochlorothiazide (MICROZIDE) 12.5 MG capsule Take 12.5 mg by mouth every morning.            cholecalciferol (VITAMIN D) 1000 UNITS tablet Take 1,000 Units by mouth daily.            fish oil-omega-3 fatty acids 1000 MG capsule Take 2 g by mouth 2  (two) times daily.           aspirin 81 MG tablet Take 81 mg by mouth at bedtime.            simvastatin (ZOCOR) 20 MG tablet Take 20 mg by mouth every morning.           carvedilol (COREG) 12.5 MG tablet Take 6.25 mg by mouth every morning.           metFORMIN (GLUCOPHAGE-XR) 500 MG 24 hr tablet Take 500 mg by mouth daily with breakfast.           vitamin B-12 (CYANOCOBALAMIN) 1000 MCG tablet Take 1,000 mcg by mouth daily.           HYDROcodone-acetaminophen (NORCO/VICODIN) 5-325 MG per tablet Take 1-2 tablets by mouth every 4 (four) hours as needed for pain.             Activity: no heavy lifting for 4 weeks Diet: regular diet Wound Care: none needed  Follow-up:  With Dr. Johna Sheriff in 10 days.  Signed: Mariella Saa MD, FACS  07/13/2012, 8:57 AM

## 2012-07-13 NOTE — Progress Notes (Signed)
PT HAD ME ASK MD IF IT WAS OK IF HE TOOK A HERBAL SUPPLEMENT CALLED "[employer]" TO HELP HIM WITH CONSTIPATION.  MD SAID HE WAS NOT FAMILIAR WITH THAT SUPPLEMENT BUT HE DIDN'T THINK IT WOULD DO HIM ANY HARM TO TAKE IT.  I RELAYED THAT INFO TO PT.  HE AND HIS WIFE VERBALIZED UNDERSTANDING.

## 2012-07-25 ENCOUNTER — Ambulatory Visit (INDEPENDENT_AMBULATORY_CARE_PROVIDER_SITE_OTHER): Payer: Medicare Other | Admitting: General Surgery

## 2012-07-25 ENCOUNTER — Encounter (INDEPENDENT_AMBULATORY_CARE_PROVIDER_SITE_OTHER): Payer: Self-pay | Admitting: General Surgery

## 2012-07-25 VITALS — BP 106/58 | HR 63 | Temp 97.7°F | Ht 72.0 in | Wt 253.2 lb

## 2012-07-25 DIAGNOSIS — Z09 Encounter for follow-up examination after completed treatment for conditions other than malignant neoplasm: Secondary | ICD-10-CM

## 2012-07-25 NOTE — Progress Notes (Signed)
Chief complaint: Followup colectomy  History: Patient returns 3 weeks following laparoscopic right hemicolectomy for a carpeting adenoma near the ileocecal valve. He reports he is doing well. No GI complaints. [facility]  Exam:BP 106/58  Pulse 63  Temp 97.7 F (36.5 C) (Temporal)  Ht 6' (1.829 m)  Wt 253 lb 3.2 oz (114.851 kg)  BMI 34.34 kg/m2  SpO2 97% General: He appears well Abdomen: Soft and nontender. Incisions are all well healed. I removed his staples and Steri-Stripped the wounds.  Assessment and plan: Doing very well following colectomy. No complications identified. He will avoid heavy lifting for 3 weeks. He is doing well enough I did not give him a fixed appointment that he understands to call as needed for any concerns.

## 2014-08-04 ENCOUNTER — Ambulatory Visit: Payer: Medicare Other | Admitting: Cardiology

## 2014-08-24 ENCOUNTER — Encounter: Payer: Self-pay | Admitting: Cardiology

## 2014-08-24 ENCOUNTER — Ambulatory Visit (INDEPENDENT_AMBULATORY_CARE_PROVIDER_SITE_OTHER): Payer: Medicare HMO | Admitting: Cardiology

## 2014-08-24 VITALS — BP 144/82 | HR 61 | Ht 72.0 in | Wt 276.0 lb

## 2014-08-24 DIAGNOSIS — R0609 Other forms of dyspnea: Secondary | ICD-10-CM

## 2014-08-24 DIAGNOSIS — I10 Essential (primary) hypertension: Secondary | ICD-10-CM | POA: Insufficient documentation

## 2014-08-24 DIAGNOSIS — E119 Type 2 diabetes mellitus without complications: Secondary | ICD-10-CM | POA: Insufficient documentation

## 2014-08-24 DIAGNOSIS — R0600 Dyspnea, unspecified: Secondary | ICD-10-CM | POA: Insufficient documentation

## 2014-08-24 DIAGNOSIS — E785 Hyperlipidemia, unspecified: Secondary | ICD-10-CM | POA: Insufficient documentation

## 2014-08-24 DIAGNOSIS — R011 Cardiac murmur, unspecified: Secondary | ICD-10-CM

## 2014-08-24 NOTE — Patient Instructions (Signed)
Your physician has requested that you have a lexiscan myoview. For further information please visit [email or web address]. Please follow instruction sheet, as given.  Your physician has requested that you have an echocardiogram. Echocardiography is a painless test that uses sound waves to create images of your heart. It provides your doctor with information about the size and shape of your heart and how well your heart's chambers and valves are working. This procedure takes approximately one hour. There are no restrictions for this procedure.  Your physician recommends that you schedule a follow-up appointment AS NEEDED with Dr. Radford Pax pending your tests.

## 2014-08-24 NOTE — Progress Notes (Signed)
[place], [address] [place], [place]  [address] Phone: [phone] Fax:  [phone]  Date:  08/24/2014   ID:  Dylan [name], DOB 26-Feb-1926, MRN [id number]  PCP:  [name], MD  Cardiologist:  Fransico Him, MD    History of Present Illness: Dylan [name] is a 81 y.o. male with a history of HTN, type II DM and dyslipidemia who presents today for evaluation of DOE.  This has been going on for several months and apparently was recommended that he see Cardiology but did not follow through but when he saw his PCP last he decided to follow through and is here now for evaluation. He apparently had a cardiac cath about 20 years ago that he says was fine.   He says that the DOE only occurs with activity but is able to get dressed and shower without any problems.  It seems to occur mainly with walking.  He can ride a stationary bike 5 miles for up to an hour at the gym and not have any problems.  He denies any chest pain or pressure.  He denies any palpitations or syncope.  He has LE edema which is chronic.   Wt Readings from Last 3 Encounters:  08/24/14 276 lb (125.193 kg)  07/25/12 253 lb 3.2 oz (114.851 kg)  07/09/12 259 lb (117.482 kg)     Past Medical History  Diagnosis Date  . Hypertension   . Hyperlipidemia   . Hyperglycemia   . Lung nodule     Current Outpatient Prescriptions  Medication Sig Dispense Refill  . aspirin 81 MG tablet Take 81 mg by mouth at bedtime.     . carvedilol (COREG) 12.5 MG tablet Take 6.25 mg by mouth every morning.    . cholecalciferol (VITAMIN D) 1000 UNITS tablet Take 1,000 Units by mouth daily.     . fish oil-omega-3 fatty acids 1000 MG capsule Take 2 g by mouth 2 (two) times daily.    Marland Kitchen gabapentin (NEURONTIN) 100 MG capsule Take 100 mg by mouth 2 (two) times daily.    . hydrochlorothiazide (MICROZIDE) 12.5 MG capsule Take 12.5 mg by mouth every morning.     Marland Kitchen HYDROcodone-acetaminophen (NORCO/VICODIN) 5-325 MG per tablet Take 1-2 tablets by mouth  every 4 (four) hours as needed for pain. 30 tablet 1  . metFORMIN (GLUCOPHAGE-XR) 500 MG 24 hr tablet Take 500 mg by mouth daily with breakfast.    . simvastatin (ZOCOR) 20 MG tablet Take 20 mg by mouth every morning.    . terazosin (HYTRIN) 2 MG capsule Take 2 mg by mouth at bedtime.    . vitamin B-12 (CYANOCOBALAMIN) 1000 MCG tablet Take 1,000 mcg by mouth daily.     No current facility-administered medications for this visit.    Allergies:    Allergies  Allergen Reactions  . Keflex [Cephalexin] Hives    1980's    Social History:  The patient  reports that he quit smoking about 22 years ago. His smoking use included Cigarettes. He smoked 1.00 pack per day. He does not have any smokeless tobacco history on file. He reports that he drinks about 1.2 oz of alcohol per week. He reports that he does not use illicit drugs.   Family History:  The patient's family history includes CVA in his father; Cancer - Other in his mother; Heart attack in his brother and maternal grandmother.   ROS:  Please see the history of present illness.  All other systems reviewed and negative.   PHYSICAL EXAM: VS:  BP 144/82 mmHg  Pulse 61  Ht 6' (1.829 m)  Wt 276 lb (125.193 kg)  BMI 37.42 kg/m2 Well nourished, well developed, in no acute distress HEENT: normal Neck: no JVD Cardiac:  normal S1, S2; RRR; 2/6 SM at RUSB to LLSB  Lungs:  clear to auscultation bilaterally, no wheezing, rhonchi or rales Abd: soft, nontender, no hepatomegaly Ext: no edema Skin: warm and dry Neuro:  CNs 2-12 intact, no focal abnormalities noted  EKG:     NSR with short PR interval  ASSESSMENT AND PLAN:  1. DOE of unclear etiology.  He has no other symptoms related to this.  He has several CRF including advanced age, HTN, dyslipidemia, type II DM, former tobacco use and family history of CAD.  Recent chest xray was normal.  I will get a Lexiscan myoview to assess for ischemia.  I will also get a 2D echo to assess for LVF  and diastolic function. 2. HTN - controlled.  Continue Coreg/HCTZ/Hytrin 3. Dyslipidemia - continue statin 4. Type II DM - per PCP.  On Metformin 5. Heart murmur - I will get an echo to assess  Followup with me PRN pending results of studies  Signed, Fransico Him, MD [facility] 08/24/2014 11:52 AM

## 2014-09-06 ENCOUNTER — Ambulatory Visit (HOSPITAL_BASED_OUTPATIENT_CLINIC_OR_DEPARTMENT_OTHER): Payer: Medicare HMO

## 2014-09-06 ENCOUNTER — Ambulatory Visit (HOSPITAL_COMMUNITY): Payer: Medicare HMO | Attending: Internal Medicine | Admitting: Radiology

## 2014-09-06 DIAGNOSIS — E785 Hyperlipidemia, unspecified: Secondary | ICD-10-CM | POA: Insufficient documentation

## 2014-09-06 DIAGNOSIS — E119 Type 2 diabetes mellitus without complications: Secondary | ICD-10-CM | POA: Insufficient documentation

## 2014-09-06 DIAGNOSIS — R0600 Dyspnea, unspecified: Secondary | ICD-10-CM

## 2014-09-06 DIAGNOSIS — I10 Essential (primary) hypertension: Secondary | ICD-10-CM | POA: Diagnosis not present

## 2014-09-06 DIAGNOSIS — R011 Cardiac murmur, unspecified: Secondary | ICD-10-CM

## 2014-09-06 DIAGNOSIS — R0609 Other forms of dyspnea: Principal | ICD-10-CM

## 2014-09-06 MED ORDER — REGADENOSON 0.4 MG/5ML IV SOLN
0.4000 mg | Freq: Once | INTRAVENOUS | Status: AC
  Administered 2014-09-06: 0.4 mg via INTRAVENOUS

## 2014-09-06 MED ORDER — TECHNETIUM TC 99M SESTAMIBI GENERIC - CARDIOLITE
10.0000 | Freq: Once | INTRAVENOUS | Status: AC | PRN
  Administered 2014-09-06: 10 via INTRAVENOUS

## 2014-09-06 MED ORDER — TECHNETIUM TC 99M SESTAMIBI GENERIC - CARDIOLITE
30.0000 | Freq: Once | INTRAVENOUS | Status: AC | PRN
  Administered 2014-09-06: 30 via INTRAVENOUS

## 2014-09-06 NOTE — Progress Notes (Signed)
[facility] [address] [place], [place] [address] [phone]    Cardiology Nuclear Med Study  Dylan [name] is a 81 y.o. male     MRN : [id number]     DOB: July 11, 1926  Procedure Date: 09/06/2014  Nuclear Med Background Indication for Stress Test:  Evaluation for Ischemia History:  MPI: 12 yrs ago NL per pt  Cardiac Risk Factors: Hypertension, Lipids and NIDDM  Symptoms:  DOE   Nuclear Pre-Procedure Caffeine/Decaff Intake:  8:00pm NPO After: 8:00pm   Lungs:  clear O2 Sat: 94% on room air. IV 0.9% NS with Angio Cath:  22g  IV Site: R Hand  IV Started by:  Matilde Haymaker, RN  Chest Size (in):  50 Cup Size: n/a  Height: 6' (1.829 m)  Weight:  270 lb (122.471 kg)  BMI:  Body mass index is 36.61 kg/(m^2). Tech Comments:  No Coreg x 24 hrs    Nuclear Med Study 1 or 2 day study: 1 day  Stress Test Type:  Lexiscan  Reading MD: n/a  Order Authorizing Provider:  Tressia Miners Turner,MD  Resting Radionuclide: Technetium 74m Sestamibi  Resting Radionuclide Dose: 11.0 mCi   Stress Radionuclide:  Technetium 31m Sestamibi  Stress Radionuclide Dose: 33.0 mCi           Stress Protocol Rest HR: 52 Stress HR: 72  Rest BP: 142/83 Stress BP: 144/73  Exercise Time (min): n/a METS: n/a   Predicted Max HR: 134 bpm % Max HR: 53.73 bpm Rate Pressure Product: 10368   Dose of Adenosine (mg):  n/a Dose of Lexiscan: 0.4 mg  Dose of Atropine (mg): n/a Dose of Dobutamine: n/a mcg/kg/min (at max HR)  Stress Test Technologist: Perrin Maltese, EMT-P  Nuclear Technologist:  Earl Many, CNMT     Rest Procedure:  Myocardial perfusion imaging was performed at rest 45 minutes following the intravenous administration of Technetium 24m Sestamibi. Rest ECG: NSR - Normal EKG  Stress Procedure:  The patient received IV Lexiscan 0.4 mg over 15-seconds.  Technetium 62m Sestamibi injected at 30-seconds. This patient had sob and abdominal pressure with the Lexiscan  injection. Quantitative spect images were obtained after a 45 minute delay. Stress ECG: No significant change from baseline ECG  QPS Raw Data Images:  Normal; no motion artifact; normal heart/lung ratio. Stress Images:  There is mild apical thinning with normal uptake in other regions. Rest Images:  Normal homogeneous uptake in all areas of the myocardium. Subtraction (SDS):  Small area of apical thinning seen on stress images but not on resting images. Transient Ischemic Dilatation (Normal <1.22):  0.99 Lung/Heart Ratio (Normal <0.45):  0.32  Quantitative Gated Spect Images QGS EDV:  122 ml QGS ESV:  50 ml  Impression Exercise Capacity:  Lexiscan with low level exercise. BP Response:  Normal blood pressure response. Clinical Symptoms:  No chest pain. ECG Impression:  No significant ST segment change suggestive of ischemia. Comparison with Prior Nuclear Study: No images to compare  Overall Impression:  Low risk stress nuclear study. Small area of apical thinning seen on stress images only.  Cannot exclude small area of apical reversible ischemia. However there are no apical wall motion abnormalities. No chest pain or EKG abnormalities. The variation in apical perfusion may reflect slight change in body position in this 270 lb man.  LV Ejection Fraction: 59%.  LV Wall Motion:  NL LV Function; NL Wall Motion  Darlin Coco  MD

## 2014-09-06 NOTE — Progress Notes (Signed)
2D Echo completed. 09/06/2014

## 2014-09-08 ENCOUNTER — Telehealth: Payer: Self-pay | Admitting: Cardiology

## 2014-09-08 DIAGNOSIS — R0600 Dyspnea, unspecified: Secondary | ICD-10-CM

## 2014-09-08 DIAGNOSIS — R0609 Other forms of dyspnea: Principal | ICD-10-CM

## 2014-09-08 NOTE — Telephone Encounter (Signed)
New Message     Patient is returning call please call back.  THanks.

## 2014-09-08 NOTE — Telephone Encounter (Signed)
Left message to call back  

## 2014-09-09 NOTE — Telephone Encounter (Signed)
Patient informed of results and verbal understanding expressed.  BNP scheduled for Monday, January 25 per patient request.

## 2014-09-09 NOTE — Telephone Encounter (Signed)
Follow up ° ° ° ° °Want echo results °

## 2014-09-09 NOTE — Telephone Encounter (Signed)
-----   Message from Sueanne Margarita, MD sent at 09/08/2014  1:29 PM EST ----- Please let patient know that echo showed normal LVF with mildly thickened heart muscle, mildly leaky AV and MV and mild pulmonary HTN.  Please have her come in for BNP since she has diastolic dysfunction on her echo.

## 2014-09-13 ENCOUNTER — Other Ambulatory Visit: Payer: Medicare HMO

## 2014-09-14 ENCOUNTER — Other Ambulatory Visit (INDEPENDENT_AMBULATORY_CARE_PROVIDER_SITE_OTHER): Payer: Medicare HMO | Admitting: *Deleted

## 2014-09-14 DIAGNOSIS — R0609 Other forms of dyspnea: Secondary | ICD-10-CM

## 2014-09-14 DIAGNOSIS — R0600 Dyspnea, unspecified: Secondary | ICD-10-CM

## 2014-09-14 LAB — BRAIN NATRIURETIC PEPTIDE: PRO B NATRI PEPTIDE: 71 pg/mL (ref 0.0–100.0)

## 2014-09-14 NOTE — Addendum Note (Signed)
Addended by: Cortny Bambach K on: 07/26/2015 07:44 AM   Modules accepted: Orders  

## 2014-09-15 ENCOUNTER — Encounter: Payer: Self-pay | Admitting: Cardiology

## 2014-09-15 NOTE — Telephone Encounter (Signed)
This encounter was created in error - please disregard.

## 2014-09-15 NOTE — Telephone Encounter (Signed)
New message     Returned Dylan [name]'s call

## 2015-01-13 ENCOUNTER — Encounter: Payer: Self-pay | Admitting: Cardiology

## 2015-02-22 ENCOUNTER — Other Ambulatory Visit: Payer: Self-pay | Admitting: Internal Medicine

## 2015-02-22 DIAGNOSIS — M549 Dorsalgia, unspecified: Principal | ICD-10-CM

## 2015-02-22 DIAGNOSIS — G8929 Other chronic pain: Secondary | ICD-10-CM

## 2015-03-07 ENCOUNTER — Other Ambulatory Visit: Payer: Self-pay | Admitting: Internal Medicine

## 2015-03-07 ENCOUNTER — Ambulatory Visit
Admission: RE | Admit: 2015-03-07 | Discharge: 2015-03-07 | Disposition: A | Discharge status: Home / Self Care | Payer: Medicare HMO | Source: Ambulatory Visit | Attending: Internal Medicine | Admitting: Internal Medicine

## 2015-03-07 DIAGNOSIS — M549 Dorsalgia, unspecified: Principal | ICD-10-CM

## 2015-03-07 DIAGNOSIS — G8929 Other chronic pain: Secondary | ICD-10-CM

## 2015-03-07 MED ORDER — METHYLPREDNISOLONE ACETATE 40 MG/ML INJ SUSP (RADIOLOG
120.0000 mg | Freq: Once | INTRAMUSCULAR | Status: AC
  Administered 2015-03-07: 120 mg via EPIDURAL

## 2015-03-07 MED ORDER — IOHEXOL 180 MG/ML  SOLN
1.0000 mL | Freq: Once | INTRAMUSCULAR | Status: AC | PRN
Start: 2015-03-07 — End: 2015-03-07
  Administered 2015-03-07: 1 mL via INTRATHECAL

## 2015-03-07 NOTE — Discharge Instructions (Signed)

## 2015-06-26 DIAGNOSIS — Z23 Encounter for immunization: Secondary | ICD-10-CM | POA: Diagnosis not present

## 2015-07-12 DIAGNOSIS — I10 Essential (primary) hypertension: Secondary | ICD-10-CM | POA: Diagnosis not present

## 2015-07-12 DIAGNOSIS — E119 Type 2 diabetes mellitus without complications: Secondary | ICD-10-CM | POA: Diagnosis not present

## 2015-07-19 DIAGNOSIS — E119 Type 2 diabetes mellitus without complications: Secondary | ICD-10-CM | POA: Diagnosis not present

## 2015-07-19 DIAGNOSIS — I10 Essential (primary) hypertension: Secondary | ICD-10-CM | POA: Diagnosis not present

## 2015-07-19 DIAGNOSIS — E7800 Pure hypercholesterolemia, unspecified: Secondary | ICD-10-CM | POA: Diagnosis not present

## 2015-07-19 DIAGNOSIS — N400 Enlarged prostate without lower urinary tract symptoms: Secondary | ICD-10-CM | POA: Diagnosis not present

## 2015-08-20 ENCOUNTER — Encounter (HOSPITAL_COMMUNITY): Payer: Self-pay | Admitting: *Deleted

## 2015-08-20 ENCOUNTER — Emergency Department (HOSPITAL_COMMUNITY)
Admission: EM | Admit: 2015-08-20 | Discharge: 2015-08-20 | Disposition: A | Discharge status: Home / Self Care | Payer: Medicare HMO | Source: Home / Self Care | Attending: Family Medicine | Admitting: Family Medicine

## 2015-08-20 DIAGNOSIS — B029 Zoster without complications: Secondary | ICD-10-CM

## 2015-08-20 MED ORDER — VALACYCLOVIR HCL 1 G PO TABS: 1000.0000 mg | ORAL_TABLET | Freq: Three times a day (TID) | ORAL | Status: DC

## 2015-08-20 NOTE — ED Provider Notes (Signed)
CSN: [id number]     Arrival date & time 08/20/15  1115 History   First MD Initiated Contact with Patient 08/20/15 1141     Chief Complaint  Patient presents with  . Rash   (Consider location/radiation/quality/duration/timing/severity/associated sxs/prior Treatment) Patient is a 81 y.o. male presenting with rash. The history is provided by the patient.  Rash Location:  Torso Torso rash location:  R chest Quality: blistering and burning   Severity:  Moderate Onset quality:  Sudden Duration:  2 days Chronicity:  New Relieved by:  None tried Worsened by:  Nothing tried Ineffective treatments:  None tried Associated symptoms: no fever     Past Medical History  Diagnosis Date  . Hypertension   . Hyperlipidemia   . Hyperglycemia   . Lung nodule    Past Surgical History  Procedure Laterality Date  . Appendectomy  1956  . Hernia repair  1952  . Back surgery  2008  . Replacement total knee  2010  . Hot hemostasis  01/08/2012    Procedure: HOT HEMOSTASIS (ARGON PLASMA COAGULATION/BICAP);  Surgeon: Arta Silence, MD;  Location: [facility];  Service: Endoscopy;  Laterality: N/A;  . Cardiac catheterization  1995    negative results  . Laparoscopic ileocecectomy  07/09/2012    Procedure: LAPAROSCOPIC ILEOCECECTOMY;  Surgeon: Edward Jolly, MD;  Location: [facility];  Service: General;  Laterality: N/A;  Laparoscopic Ileocecectomy  . Colon surgery  07/09/12   Family History  Problem Relation Age of Onset  . Cancer - Other Mother   . CVA Father   . Heart attack Brother   . Heart attack Maternal Grandmother    Social History  Substance Use Topics  . Smoking status: Former Smoker -- 1.00 packs/day    Types: Cigarettes    Quit date: 01/07/1992  . Smokeless tobacco: None  . Alcohol Use: 1.2 oz/week    2 Glasses of wine per week    Review of Systems  Constitutional: Negative.  Negative for fever.  Cardiovascular: Positive for chest pain.  Skin: Positive for rash.  All  other systems reviewed and are negative.   Allergies  Keflex  Home Medications   Prior to Admission medications   Medication Sig Start Date End Date Taking? Authorizing Provider  aspirin 81 MG tablet Take 81 mg by mouth at bedtime.     Historical Provider, MD  carvedilol (COREG) 12.5 MG tablet Take 6.25 mg by mouth every morning.    Historical Provider, MD  cholecalciferol (VITAMIN D) 1000 UNITS tablet Take 1,000 Units by mouth daily.     Historical Provider, MD  fish oil-omega-3 fatty acids 1000 MG capsule Take 2 g by mouth 2 (two) times daily.    Historical Provider, MD  gabapentin (NEURONTIN) 100 MG capsule Take 100 mg by mouth 2 (two) times daily.    Historical Provider, MD  hydrochlorothiazide (MICROZIDE) 12.5 MG capsule Take 12.5 mg by mouth every morning.     Historical Provider, MD  HYDROcodone-acetaminophen (NORCO/VICODIN) 5-325 MG per tablet Take 1-2 tablets by mouth every 4 (four) hours as needed for pain. 07/13/12   Excell Seltzer, MD  metFORMIN (GLUCOPHAGE-XR) 500 MG 24 hr tablet Take 500 mg by mouth daily with breakfast.    Historical Provider, MD  simvastatin (ZOCOR) 20 MG tablet Take 20 mg by mouth every morning.    Historical Provider, MD  terazosin (HYTRIN) 2 MG capsule Take 2 mg by mouth at bedtime.    Historical Provider, MD  valACYclovir (VALTREX) 1000  MG tablet Take 1 tablet (1,000 mg total) by mouth 3 (three) times daily. 08/20/15   Billy Fischer, MD  vitamin B-12 (CYANOCOBALAMIN) 1000 MCG tablet Take 1,000 mcg by mouth daily.    Historical Provider, MD   Meds Ordered and Administered this Visit  Medications - No data to display  BP 124/79 mmHg  Pulse 60  Temp(Src) 97.9 F (36.6 C) (Oral)  Resp 20  SpO2 98% No data found.   Physical Exam  Constitutional: He is oriented to person, place, and time. He appears well-developed and well-nourished.  Neurological: He is alert and oriented to person, place, and time.  Skin: Skin is warm and dry. Rash noted.   Grouped vesicular linear ant right chest rash, no infection  Nursing note and vitals reviewed.   ED Course  Procedures (including critical care time)  Labs Review Labs Reviewed - No data to display  Imaging Review No results found.   Visual Acuity Review  Right Eye Distance:   Left Eye Distance:   Bilateral Distance:    Right Eye Near:   Left Eye Near:    Bilateral Near:         MDM   1. Shingles rash        Billy Fischer, MD 08/20/15 [phone]

## 2015-08-20 NOTE — ED Notes (Signed)
Pt  Has  A  Rash  On  His  Upper  Chest       With    Symptoms  X  3  Days

## 2015-08-26 DIAGNOSIS — B029 Zoster without complications: Secondary | ICD-10-CM | POA: Diagnosis not present

## 2015-10-11 DIAGNOSIS — J329 Chronic sinusitis, unspecified: Secondary | ICD-10-CM | POA: Diagnosis not present

## 2015-10-13 DIAGNOSIS — R69 Illness, unspecified: Secondary | ICD-10-CM | POA: Diagnosis not present

## 2015-10-20 DIAGNOSIS — R6889 Other general symptoms and signs: Secondary | ICD-10-CM | POA: Diagnosis not present

## 2015-10-20 DIAGNOSIS — J22 Unspecified acute lower respiratory infection: Secondary | ICD-10-CM | POA: Diagnosis not present

## 2015-10-20 DIAGNOSIS — R062 Wheezing: Secondary | ICD-10-CM | POA: Diagnosis not present

## 2015-10-20 DIAGNOSIS — R0602 Shortness of breath: Secondary | ICD-10-CM | POA: Diagnosis not present

## 2015-10-25 DIAGNOSIS — R05 Cough: Secondary | ICD-10-CM | POA: Diagnosis not present

## 2016-01-17 DIAGNOSIS — Z0000 Encounter for general adult medical examination without abnormal findings: Secondary | ICD-10-CM | POA: Diagnosis not present

## 2016-01-17 DIAGNOSIS — E119 Type 2 diabetes mellitus without complications: Secondary | ICD-10-CM | POA: Diagnosis not present

## 2016-01-17 DIAGNOSIS — N400 Enlarged prostate without lower urinary tract symptoms: Secondary | ICD-10-CM | POA: Diagnosis not present

## 2016-01-17 DIAGNOSIS — I10 Essential (primary) hypertension: Secondary | ICD-10-CM | POA: Diagnosis not present

## 2016-01-17 DIAGNOSIS — E559 Vitamin D deficiency, unspecified: Secondary | ICD-10-CM | POA: Diagnosis not present

## 2016-01-17 DIAGNOSIS — Z125 Encounter for screening for malignant neoplasm of prostate: Secondary | ICD-10-CM | POA: Diagnosis not present

## 2016-03-19 DIAGNOSIS — L570 Actinic keratosis: Secondary | ICD-10-CM | POA: Diagnosis not present

## 2016-03-19 DIAGNOSIS — I10 Essential (primary) hypertension: Secondary | ICD-10-CM | POA: Diagnosis not present

## 2016-03-19 DIAGNOSIS — E119 Type 2 diabetes mellitus without complications: Secondary | ICD-10-CM | POA: Diagnosis not present

## 2016-03-19 DIAGNOSIS — E7800 Pure hypercholesterolemia, unspecified: Secondary | ICD-10-CM | POA: Diagnosis not present

## 2016-03-19 DIAGNOSIS — G629 Polyneuropathy, unspecified: Secondary | ICD-10-CM | POA: Diagnosis not present

## 2016-04-06 DIAGNOSIS — R69 Illness, unspecified: Secondary | ICD-10-CM | POA: Diagnosis not present

## 2016-04-17 DIAGNOSIS — L570 Actinic keratosis: Secondary | ICD-10-CM | POA: Diagnosis not present

## 2016-04-17 DIAGNOSIS — I10 Essential (primary) hypertension: Secondary | ICD-10-CM | POA: Diagnosis not present

## 2016-06-14 DIAGNOSIS — D485 Neoplasm of uncertain behavior of skin: Secondary | ICD-10-CM | POA: Diagnosis not present

## 2016-06-14 DIAGNOSIS — L814 Other melanin hyperpigmentation: Secondary | ICD-10-CM | POA: Diagnosis not present

## 2016-06-14 DIAGNOSIS — D2239 Melanocytic nevi of other parts of face: Secondary | ICD-10-CM | POA: Diagnosis not present

## 2016-06-14 DIAGNOSIS — L905 Scar conditions and fibrosis of skin: Secondary | ICD-10-CM | POA: Diagnosis not present

## 2016-06-14 DIAGNOSIS — D225 Melanocytic nevi of trunk: Secondary | ICD-10-CM | POA: Diagnosis not present

## 2016-06-14 DIAGNOSIS — C44222 Squamous cell carcinoma of skin of right ear and external auricular canal: Secondary | ICD-10-CM | POA: Diagnosis not present

## 2016-06-14 DIAGNOSIS — L821 Other seborrheic keratosis: Secondary | ICD-10-CM | POA: Diagnosis not present

## 2016-06-14 DIAGNOSIS — C44311 Basal cell carcinoma of skin of nose: Secondary | ICD-10-CM | POA: Diagnosis not present

## 2016-06-14 DIAGNOSIS — Z85828 Personal history of other malignant neoplasm of skin: Secondary | ICD-10-CM | POA: Diagnosis not present

## 2016-06-14 DIAGNOSIS — L570 Actinic keratosis: Secondary | ICD-10-CM | POA: Diagnosis not present

## 2016-06-14 DIAGNOSIS — D1801 Hemangioma of skin and subcutaneous tissue: Secondary | ICD-10-CM | POA: Diagnosis not present

## 2016-06-25 DIAGNOSIS — R69 Illness, unspecified: Secondary | ICD-10-CM | POA: Diagnosis not present

## 2016-07-05 DIAGNOSIS — Z85828 Personal history of other malignant neoplasm of skin: Secondary | ICD-10-CM | POA: Diagnosis not present

## 2016-07-05 DIAGNOSIS — C44222 Squamous cell carcinoma of skin of right ear and external auricular canal: Secondary | ICD-10-CM | POA: Diagnosis not present

## 2016-08-11 DIAGNOSIS — E1121 Type 2 diabetes mellitus with diabetic nephropathy: Secondary | ICD-10-CM | POA: Diagnosis not present

## 2016-08-11 DIAGNOSIS — I10 Essential (primary) hypertension: Secondary | ICD-10-CM | POA: Diagnosis not present

## 2016-08-11 DIAGNOSIS — E7800 Pure hypercholesterolemia, unspecified: Secondary | ICD-10-CM | POA: Diagnosis not present

## 2016-08-11 DIAGNOSIS — I6523 Occlusion and stenosis of bilateral carotid arteries: Secondary | ICD-10-CM | POA: Diagnosis not present

## 2016-08-11 DIAGNOSIS — Z0000 Encounter for general adult medical examination without abnormal findings: Secondary | ICD-10-CM | POA: Diagnosis not present

## 2016-09-08 DIAGNOSIS — G5602 Carpal tunnel syndrome, left upper limb: Secondary | ICD-10-CM | POA: Diagnosis not present

## 2016-09-08 DIAGNOSIS — M79642 Pain in left hand: Secondary | ICD-10-CM | POA: Diagnosis not present

## 2016-10-08 DIAGNOSIS — M79642 Pain in left hand: Secondary | ICD-10-CM | POA: Diagnosis not present

## 2016-10-08 DIAGNOSIS — G5602 Carpal tunnel syndrome, left upper limb: Secondary | ICD-10-CM | POA: Diagnosis not present

## 2016-10-15 DIAGNOSIS — I10 Essential (primary) hypertension: Secondary | ICD-10-CM | POA: Diagnosis not present

## 2016-10-18 DIAGNOSIS — N400 Enlarged prostate without lower urinary tract symptoms: Secondary | ICD-10-CM | POA: Diagnosis not present

## 2016-10-18 DIAGNOSIS — Z0000 Encounter for general adult medical examination without abnormal findings: Secondary | ICD-10-CM | POA: Diagnosis not present

## 2016-10-18 DIAGNOSIS — I10 Essential (primary) hypertension: Secondary | ICD-10-CM | POA: Diagnosis not present

## 2016-10-18 DIAGNOSIS — E7800 Pure hypercholesterolemia, unspecified: Secondary | ICD-10-CM | POA: Diagnosis not present

## 2016-10-18 DIAGNOSIS — R739 Hyperglycemia, unspecified: Secondary | ICD-10-CM | POA: Diagnosis not present

## 2016-10-25 DIAGNOSIS — G5602 Carpal tunnel syndrome, left upper limb: Secondary | ICD-10-CM | POA: Diagnosis not present

## 2016-10-25 DIAGNOSIS — M659 Synovitis and tenosynovitis, unspecified: Secondary | ICD-10-CM | POA: Diagnosis not present

## 2016-10-25 DIAGNOSIS — G8918 Other acute postprocedural pain: Secondary | ICD-10-CM | POA: Diagnosis not present

## 2016-10-25 DIAGNOSIS — M65842 Other synovitis and tenosynovitis, left hand: Secondary | ICD-10-CM | POA: Diagnosis not present

## 2016-11-07 DIAGNOSIS — M65332 Trigger finger, left middle finger: Secondary | ICD-10-CM | POA: Diagnosis not present

## 2016-11-07 DIAGNOSIS — Z4789 Encounter for other orthopedic aftercare: Secondary | ICD-10-CM | POA: Diagnosis not present

## 2016-11-07 DIAGNOSIS — G5602 Carpal tunnel syndrome, left upper limb: Secondary | ICD-10-CM | POA: Diagnosis not present

## 2017-01-07 DIAGNOSIS — R69 Illness, unspecified: Secondary | ICD-10-CM | POA: Diagnosis not present

## 2017-01-16 ENCOUNTER — Emergency Department (HOSPITAL_COMMUNITY)
Admission: EM | Admit: 2017-01-16 | Discharge: 2017-01-16 | Disposition: A | Discharge status: Home / Self Care | Payer: Medicare HMO | Attending: Emergency Medicine | Admitting: Emergency Medicine

## 2017-01-16 ENCOUNTER — Encounter (HOSPITAL_COMMUNITY): Payer: Self-pay | Admitting: Emergency Medicine

## 2017-01-16 DIAGNOSIS — Z79899 Other long term (current) drug therapy: Secondary | ICD-10-CM | POA: Insufficient documentation

## 2017-01-16 DIAGNOSIS — E860 Dehydration: Secondary | ICD-10-CM

## 2017-01-16 DIAGNOSIS — Z87891 Personal history of nicotine dependence: Secondary | ICD-10-CM | POA: Diagnosis not present

## 2017-01-16 DIAGNOSIS — R42 Dizziness and giddiness: Secondary | ICD-10-CM | POA: Diagnosis present

## 2017-01-16 DIAGNOSIS — E119 Type 2 diabetes mellitus without complications: Secondary | ICD-10-CM | POA: Diagnosis not present

## 2017-01-16 DIAGNOSIS — Z7984 Long term (current) use of oral hypoglycemic drugs: Secondary | ICD-10-CM | POA: Insufficient documentation

## 2017-01-16 DIAGNOSIS — Z7982 Long term (current) use of aspirin: Secondary | ICD-10-CM | POA: Insufficient documentation

## 2017-01-16 DIAGNOSIS — I10 Essential (primary) hypertension: Secondary | ICD-10-CM | POA: Insufficient documentation

## 2017-01-16 LAB — BASIC METABOLIC PANEL
Anion gap: 14 (ref 5–15)
BUN: 19 mg/dL (ref 6–20)
CALCIUM: 9.3 mg/dL (ref 8.9–10.3)
CO2: 22 mmol/L (ref 22–32)
Chloride: 102 mmol/L (ref 101–111)
Creatinine, Ser: 1.48 mg/dL — ABNORMAL HIGH (ref 0.61–1.24)
GFR, EST AFRICAN AMERICAN: 47 mL/min — AB (ref >60)
GFR, EST NON AFRICAN AMERICAN: 40 mL/min — AB (ref >60)
GLUCOSE: 96 mg/dL (ref 65–99)
POTASSIUM: 3.4 mmol/L — AB (ref 3.5–5.1)
Sodium: 138 mmol/L (ref 135–145)

## 2017-01-16 LAB — CBC
HEMATOCRIT: 39.4 % (ref 39.0–52.0)
Hemoglobin: 13.6 g/dL (ref 13.0–17.0)
MCH: 32.3 pg (ref 26.0–34.0)
MCHC: 34.5 g/dL (ref 30.0–36.0)
MCV: 93.6 fL (ref 78.0–100.0)
Platelets: 161 10*3/uL (ref 150–400)
RBC: 4.21 MIL/uL — AB (ref 4.22–5.81)
RDW: 12.9 % (ref 11.5–15.5)
WBC: 6.9 10*3/uL (ref 4.0–10.5)

## 2017-01-16 LAB — URINALYSIS, ROUTINE W REFLEX MICROSCOPIC
Bilirubin Urine: NEGATIVE
GLUCOSE, UA: NEGATIVE mg/dL
HGB URINE DIPSTICK: NEGATIVE
KETONES UR: NEGATIVE mg/dL
NITRITE: NEGATIVE
PH: 5 (ref 5.0–8.0)
PROTEIN: NEGATIVE mg/dL
RBC / HPF: NONE SEEN RBC/hpf (ref 0–5)
Specific Gravity, Urine: 1.013 (ref 1.005–1.030)

## 2017-01-16 MED ORDER — SODIUM CHLORIDE 0.9 % IV BOLUS (SEPSIS)
500.0000 mL | Freq: Once | INTRAVENOUS | Status: AC
  Administered 2017-01-16: 500 mL via INTRAVENOUS

## 2017-01-16 MED ORDER — SODIUM CHLORIDE 0.9 % IV SOLN
INTRAVENOUS | Status: DC
  Administered 2017-01-16: 125 mL/h via INTRAVENOUS

## 2017-01-16 NOTE — ED Triage Notes (Signed)
Pt with hypotension today. Wife reports she took bp at home 80/53, pt bp in triage: 87/54. Pt c/o pain to coccyx and denies injury. Pt states he had 6 teeth extracted 01/07/17 and has not been able to eat or drink as usual. PMD advised patient to be eval in ED.

## 2017-01-16 NOTE — ED Notes (Signed)
Pt ambulated over 30 feet without distress.  Pt showed steady gait.  Pt's O2 and Pulse were within normal limits. B/P was 150/78.

## 2017-01-16 NOTE — Discharge Instructions (Signed)
Try to drink an extra 2 L of water each day to prevent dehydration.  Try to eat 3 meals each day.

## 2017-01-16 NOTE — ED Notes (Signed)
Pt is unable to obtain a urine sample at this time.

## 2017-01-16 NOTE — ED Notes (Signed)
Pt not able to urinate.   

## 2017-01-16 NOTE — ED Provider Notes (Signed)
[facility] DEPT Provider Note   CSN: [id number] Arrival date & time: 01/16/17  1453     History   Chief Complaint Chief Complaint  Patient presents with  . Hypotension    HPI Dylan [name] is a 83 y.o. male.  He presents for evaluation of dizziness associated with low blood pressure.  He told his wife he is feeling dizzy this afternoon, she took his blood pressure and it was in the 80s in both arms.  Because of that they came here for evaluation.  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  He had dental extractions about 10 days ago and has had decreased appetite because the discomfort in his mouth with eating.  He has been trying to drink a lot of fluids and take protein shakes, for nutrition.  He denies fever, chills, nausea, vomiting, dysuria, urinary frequency, diarrhea or constipation.  He denies significant pain in his mouth, neck, back, abdomen or legs.  He has some pain in his "tailbone", which started today after sitting in a van for 6 hours.  He has been somewhat less active the last few days because of general malaise.  There are no other known modifying factors.  HPI  Past Medical History:  Diagnosis Date  . Hyperglycemia   . Hyperlipidemia   . Hypertension   . Lung nodule     Patient Active Problem List   Diagnosis Date Noted  . DOE (dyspnea on exertion) 08/24/2014  . Benign essential HTN 08/24/2014  . DM II (diabetes mellitus, type II), controlled ([place]) 08/24/2014  . Dyslipidemia 08/24/2014    Past Surgical History:  Procedure Laterality Date  . APPENDECTOMY  1956  . BACK SURGERY  2008  . CARDIAC CATHETERIZATION  1995   negative results  . COLON SURGERY  07/09/12  . [place]  . HOT HEMOSTASIS  01/08/2012   Procedure: HOT HEMOSTASIS (ARGON PLASMA COAGULATION/BICAP);  Surgeon: Arta Silence, MD;  Location: [facility];  Service: Endoscopy;  Laterality: N/A;  . LAPAROSCOPIC ILEOCECECTOMY  07/09/2012    Procedure: LAPAROSCOPIC ILEOCECECTOMY;  Surgeon: Edward Jolly, MD;  Location: [facility];  Service: General;  Laterality: N/A;  Laparoscopic Ileocecectomy  . REPLACEMENT TOTAL KNEE  2010       Home Medications    Prior to Admission medications   Medication Sig Start Date End Date Taking? Authorizing Provider  aspirin 81 MG tablet Take 81 mg by mouth at bedtime.    Yes [provider]  carvedilol (COREG) 6.25 MG tablet Take 6.25 mg by mouth daily.  01/05/17  Yes [provider]  cholecalciferol (VITAMIN D) 1000 UNITS tablet Take 2,000 Units by mouth daily.    Yes [provider]  gabapentin (NEURONTIN) 100 MG capsule Take 100 mg by mouth 2 (two) times daily.   Yes [provider]  hydrochlorothiazide (MICROZIDE) 12.5 MG capsule Take 12.5 mg by mouth every morning.    Yes [provider]  metFORMIN (GLUCOPHAGE-XR) 500 MG 24 hr tablet Take 500 mg by mouth daily with breakfast.   Yes [provider]  Omega-3 Fatty Acids (FISH OIL) 1200 MG CAPS Take 1 capsule by mouth 2 (two) times daily.   Yes [provider]  simvastatin (ZOCOR) 40 MG tablet Take 40 mg by mouth daily.   Yes [provider]  terazosin (HYTRIN) 2 MG capsule Take 2 mg by mouth at bedtime.   Yes [provider]  vitamin B-12 (CYANOCOBALAMIN) 1000 MCG tablet Take 1,000 mcg by mouth daily.   Yes [provider]  HYDROcodone-acetaminophen (NORCO/VICODIN) 5-325 MG per tablet Take 1-2 tablets by mouth every 4 (four) hours as needed for pain. Patient not taking: Reported on 01/16/2017 07/13/12   Excell Seltzer, MD  valACYclovir (VALTREX) 1000 MG tablet Take 1 tablet (1,000 mg total) by mouth 3 (three) times daily. Patient not taking: Reported on 01/16/2017 08/20/15   Billy Fischer, MD    Family History Family History  Problem Relation Age of Onset  . Cancer - Other Mother   . CVA Father   . Heart attack Brother   . Heart attack  Maternal Grandmother     Social History Social History  Substance Use Topics  . Smoking status: Former Smoker    Packs/day: 1.00    Types: Cigarettes    Quit date: 01/07/1992  . Smokeless tobacco: Never Used  . Alcohol use 1.2 oz/week    2 Glasses of wine per week     Allergies   Keflex [cephalexin]   Review of Systems Review of Systems  All other systems reviewed and are negative.    Physical Exam Updated Vital Signs BP 140/68 (BP Location: Right Arm)   Pulse 73   Temp 98.5 F (36.9 C) (Oral)   Resp (!) 22   Ht 6' (1.829 m)   Wt 108.9 kg (240 lb)   SpO2 96%   BMI 32.55 kg/m   Physical Exam  Constitutional: He is oriented to person, place, and time. He appears well-developed. No distress.  Elderly, frail  HENT:  Head: Normocephalic and atraumatic.  Right Ear: External ear normal.  Left Ear: External ear normal.  No trismus  Eyes: Conjunctivae and EOM are normal. Pupils are equal, round, and reactive to light.  Neck: Normal range of motion and phonation normal. Neck supple.  Cardiovascular: Normal rate, regular rhythm and normal heart sounds.   Pulmonary/Chest: Effort normal and breath sounds normal. He exhibits no bony tenderness.  Abdominal: Soft. There is no tenderness.  Musculoskeletal: Normal range of motion.  Tender coccyx region without associated swelling deformity erythema or drainage.  Neurological: He is alert and oriented to person, place, and time. No cranial nerve deficit or sensory deficit. He exhibits normal muscle tone. Coordination normal.  Skin: Skin is warm, dry and intact.  Psychiatric: He has a normal mood and affect. His behavior is normal. Judgment and thought content normal.  Nursing note and vitals reviewed.    ED Treatments / Results  Labs (all labs ordered are listed, but only abnormal results are displayed) Labs Reviewed  BASIC METABOLIC PANEL - Abnormal; Notable for the following:       Result Value   Potassium 3.4 (*)     Creatinine, Ser 1.48 (*)    GFR calc non Af Amer 40 (*)    GFR calc Af Amer 47 (*)    All other components within normal limits  CBC - Abnormal; Notable for the following:    RBC 4.21 (*)    All other components within normal limits  URINALYSIS, ROUTINE W REFLEX MICROSCOPIC - Abnormal; Notable for the following:    Leukocytes, UA MODERATE (*)    Bacteria, UA RARE (*)    Squamous Epithelial / LPF 0-5 (*)    All other components within normal limits  URINE CULTURE    EKG  EKG Interpretation  Date/Time:  Wednesday Jan 16 2017 16:47:13 EDT Ventricular Rate:  72 PR  Interval:    QRS Duration: 82 QT Interval:  425 QTC Calculation: 466 R Axis:   51 Text Interpretation:  Sinus rhythm Multiple ventricular premature complexes Abnormal R-wave progression, early transition Since last tracing Premature ventricular complexes are new Confirmed by Daleen Bo [phone]) on 01/16/2017 4:55:18 PM       Radiology No results found.  Procedures Procedures (including critical care time)  Medications Ordered in ED Medications  0.9 %  sodium chloride infusion ( Intravenous Stopped 01/16/17 2238)  sodium chloride 0.9 % bolus 500 mL (0 mLs Intravenous Stopped 01/16/17 1849)     Initial Impression / Assessment and Plan / ED Course  I have reviewed the triage vital signs and the nursing notes.  Pertinent labs & imaging results that were available during my care of the patient were reviewed by me and considered in my medical decision making (see chart for details).      Patient Vitals for the past 24 hrs:  BP Temp Temp src Pulse Resp SpO2 Height Weight  01/16/17 2223 - - - - - 96 % - -  01/16/17 2201 140/68 98.5 F (36.9 C) Oral 73 (!) 22 92 % - -  01/16/17 2000 (!) 141/72 - - 75 15 90 % - -  01/16/17 1823 137/77 - - 73 14 90 % - -  01/16/17 1530 - - - - - - 6' (1.829 m) 108.9 kg (240 lb)  01/16/17 1510 (!) 87/54 97.7 F (36.5 C) Oral 77 18 95 % - -    At discharge- reevaluation with  update and discussion. After initial assessment and treatment, an updated evaluation reveals he feels better, is ambulating easily and tolerating oral nutrition and fluids.  Findings discussed with patient and wife, all questions answered. Geraldine Tesar L    Final Clinical Impressions(s) / ED Diagnoses   Final diagnoses:  Dehydration    Dizziness likely related to dehydration, which is improved with IV fluids.  Transient orthostasis also related to volume depletion.  Doubt serious bacterial infection, complication from recent oral surgery, or metabolic instability.  Nursing Notes Reviewed/ Care Coordinated Applicable Imaging Reviewed Interpretation of Laboratory Data incorporated into ED treatment  The patient appears reasonably screened and/or stabilized for discharge and I doubt any other medical condition or other [facility] requiring further screening, evaluation, or treatment in the ED at this time prior to discharge.  Plan: Home Medications-continue usual; Home Treatments-increase oral fluids; return here if the recommended treatment, does not improve the symptoms; Recommended follow up-PCP checkup in 1 week.   New Prescriptions Discharge Medication List as of 01/16/2017 10:39 PM       Daleen Bo, MD 01/16/17 2346

## 2017-01-16 NOTE — ED Notes (Signed)
Pt is unable to urinate at this time. 

## 2017-01-18 LAB — URINE CULTURE

## 2017-01-21 DIAGNOSIS — M533 Sacrococcygeal disorders, not elsewhere classified: Secondary | ICD-10-CM | POA: Diagnosis not present

## 2017-01-21 DIAGNOSIS — I951 Orthostatic hypotension: Secondary | ICD-10-CM | POA: Diagnosis not present

## 2017-01-24 DIAGNOSIS — R69 Illness, unspecified: Secondary | ICD-10-CM | POA: Diagnosis not present

## 2017-01-31 DIAGNOSIS — M79642 Pain in left hand: Secondary | ICD-10-CM | POA: Diagnosis not present

## 2017-02-19 DIAGNOSIS — I951 Orthostatic hypotension: Secondary | ICD-10-CM | POA: Diagnosis not present

## 2017-02-19 DIAGNOSIS — M533 Sacrococcygeal disorders, not elsewhere classified: Secondary | ICD-10-CM | POA: Diagnosis not present

## 2017-04-04 DIAGNOSIS — E7800 Pure hypercholesterolemia, unspecified: Secondary | ICD-10-CM | POA: Diagnosis not present

## 2017-04-04 DIAGNOSIS — Z7984 Long term (current) use of oral hypoglycemic drugs: Secondary | ICD-10-CM | POA: Diagnosis not present

## 2017-04-04 DIAGNOSIS — Z713 Dietary counseling and surveillance: Secondary | ICD-10-CM | POA: Diagnosis not present

## 2017-04-04 DIAGNOSIS — Z85828 Personal history of other malignant neoplasm of skin: Secondary | ICD-10-CM | POA: Diagnosis not present

## 2017-04-04 DIAGNOSIS — K08109 Complete loss of teeth, unspecified cause, unspecified class: Secondary | ICD-10-CM | POA: Diagnosis not present

## 2017-04-04 DIAGNOSIS — E119 Type 2 diabetes mellitus without complications: Secondary | ICD-10-CM | POA: Diagnosis not present

## 2017-04-04 DIAGNOSIS — Z0000 Encounter for general adult medical examination without abnormal findings: Secondary | ICD-10-CM | POA: Diagnosis not present

## 2017-04-04 DIAGNOSIS — Z79899 Other long term (current) drug therapy: Secondary | ICD-10-CM | POA: Diagnosis not present

## 2017-04-04 DIAGNOSIS — N400 Enlarged prostate without lower urinary tract symptoms: Secondary | ICD-10-CM | POA: Diagnosis not present

## 2017-04-04 DIAGNOSIS — I10 Essential (primary) hypertension: Secondary | ICD-10-CM | POA: Diagnosis not present

## 2017-04-04 DIAGNOSIS — Z87891 Personal history of nicotine dependence: Secondary | ICD-10-CM | POA: Diagnosis not present

## 2017-04-04 DIAGNOSIS — Z7982 Long term (current) use of aspirin: Secondary | ICD-10-CM | POA: Diagnosis not present

## 2017-04-04 DIAGNOSIS — R011 Cardiac murmur, unspecified: Secondary | ICD-10-CM | POA: Diagnosis not present

## 2017-04-04 DIAGNOSIS — Z87448 Personal history of other diseases of urinary system: Secondary | ICD-10-CM | POA: Diagnosis not present

## 2017-04-04 DIAGNOSIS — R42 Dizziness and giddiness: Secondary | ICD-10-CM | POA: Diagnosis not present

## 2017-04-04 DIAGNOSIS — Z7902 Long term (current) use of antithrombotics/antiplatelets: Secondary | ICD-10-CM | POA: Diagnosis not present

## 2017-04-04 DIAGNOSIS — Z974 Presence of external hearing-aid: Secondary | ICD-10-CM | POA: Diagnosis not present

## 2017-04-04 DIAGNOSIS — H9113 Presbycusis, bilateral: Secondary | ICD-10-CM | POA: Diagnosis not present

## 2017-04-09 DIAGNOSIS — R69 Illness, unspecified: Secondary | ICD-10-CM | POA: Diagnosis not present

## 2017-04-23 DIAGNOSIS — N400 Enlarged prostate without lower urinary tract symptoms: Secondary | ICD-10-CM | POA: Diagnosis not present

## 2017-04-23 DIAGNOSIS — R739 Hyperglycemia, unspecified: Secondary | ICD-10-CM | POA: Diagnosis not present

## 2017-04-23 DIAGNOSIS — R5383 Other fatigue: Secondary | ICD-10-CM | POA: Diagnosis not present

## 2017-04-23 DIAGNOSIS — I10 Essential (primary) hypertension: Secondary | ICD-10-CM | POA: Diagnosis not present

## 2017-04-29 DIAGNOSIS — R739 Hyperglycemia, unspecified: Secondary | ICD-10-CM | POA: Diagnosis not present

## 2017-04-29 DIAGNOSIS — E7800 Pure hypercholesterolemia, unspecified: Secondary | ICD-10-CM | POA: Diagnosis not present

## 2017-04-29 DIAGNOSIS — I10 Essential (primary) hypertension: Secondary | ICD-10-CM | POA: Diagnosis not present

## 2017-04-29 DIAGNOSIS — Z0000 Encounter for general adult medical examination without abnormal findings: Secondary | ICD-10-CM | POA: Diagnosis not present

## 2017-06-04 DIAGNOSIS — R69 Illness, unspecified: Secondary | ICD-10-CM | POA: Diagnosis not present

## 2017-07-14 ENCOUNTER — Observation Stay (HOSPITAL_COMMUNITY)
Admission: EM | Admit: 2017-07-14 | Discharge: 2017-07-15 | Disposition: A | Discharge status: Home / Self Care | Payer: Medicare HMO | Attending: Internal Medicine | Admitting: Internal Medicine

## 2017-07-14 ENCOUNTER — Other Ambulatory Visit: Payer: Self-pay

## 2017-07-14 ENCOUNTER — Emergency Department (HOSPITAL_COMMUNITY): Payer: Medicare HMO

## 2017-07-14 ENCOUNTER — Encounter (HOSPITAL_COMMUNITY): Payer: Self-pay | Admitting: *Deleted

## 2017-07-14 DIAGNOSIS — R079 Chest pain, unspecified: Secondary | ICD-10-CM

## 2017-07-14 DIAGNOSIS — I10 Essential (primary) hypertension: Secondary | ICD-10-CM | POA: Diagnosis present

## 2017-07-14 DIAGNOSIS — Z9049 Acquired absence of other specified parts of digestive tract: Secondary | ICD-10-CM | POA: Insufficient documentation

## 2017-07-14 DIAGNOSIS — Z7984 Long term (current) use of oral hypoglycemic drugs: Secondary | ICD-10-CM | POA: Diagnosis not present

## 2017-07-14 DIAGNOSIS — R0609 Other forms of dyspnea: Secondary | ICD-10-CM

## 2017-07-14 DIAGNOSIS — E119 Type 2 diabetes mellitus without complications: Secondary | ICD-10-CM

## 2017-07-14 DIAGNOSIS — I481 Persistent atrial fibrillation: Secondary | ICD-10-CM | POA: Diagnosis not present

## 2017-07-14 DIAGNOSIS — I110 Hypertensive heart disease with heart failure: Secondary | ICD-10-CM | POA: Diagnosis not present

## 2017-07-14 DIAGNOSIS — E785 Hyperlipidemia, unspecified: Secondary | ICD-10-CM | POA: Diagnosis present

## 2017-07-14 DIAGNOSIS — Z8249 Family history of ischemic heart disease and other diseases of the circulatory system: Secondary | ICD-10-CM | POA: Insufficient documentation

## 2017-07-14 DIAGNOSIS — Z7982 Long term (current) use of aspirin: Secondary | ICD-10-CM | POA: Diagnosis not present

## 2017-07-14 DIAGNOSIS — I4819 Other persistent atrial fibrillation: Secondary | ICD-10-CM

## 2017-07-14 DIAGNOSIS — I5033 Acute on chronic diastolic (congestive) heart failure: Secondary | ICD-10-CM | POA: Insufficient documentation

## 2017-07-14 DIAGNOSIS — R0602 Shortness of breath: Secondary | ICD-10-CM | POA: Diagnosis not present

## 2017-07-14 DIAGNOSIS — Z87891 Personal history of nicotine dependence: Secondary | ICD-10-CM | POA: Diagnosis not present

## 2017-07-14 DIAGNOSIS — R0600 Dyspnea, unspecified: Secondary | ICD-10-CM

## 2017-07-14 DIAGNOSIS — R0789 Other chest pain: Secondary | ICD-10-CM | POA: Diagnosis not present

## 2017-07-14 DIAGNOSIS — I4891 Unspecified atrial fibrillation: Secondary | ICD-10-CM | POA: Diagnosis present

## 2017-07-14 LAB — CBC
HEMATOCRIT: 37 % — AB (ref 39.0–52.0)
HEMATOCRIT: 39.8 % (ref 39.0–52.0)
HEMOGLOBIN: 13 g/dL (ref 13.0–17.0)
Hemoglobin: 12.4 g/dL — ABNORMAL LOW (ref 13.0–17.0)
MCH: 31.8 pg (ref 26.0–34.0)
MCH: 31.5 pg (ref 26.0–34.0)
MCHC: 33.5 g/dL (ref 30.0–36.0)
MCHC: 32.7 g/dL (ref 30.0–36.0)
MCV: 94.9 fL (ref 78.0–100.0)
MCV: 96.4 fL (ref 78.0–100.0)
PLATELETS: 120 10*3/uL — AB (ref 150–400)
Platelets: 133 10*3/uL — ABNORMAL LOW (ref 150–400)
RBC: 3.9 MIL/uL — AB (ref 4.22–5.81)
RBC: 4.13 MIL/uL — AB (ref 4.22–5.81)
RDW: 13.6 % (ref 11.5–15.5)
RDW: 13.9 % (ref 11.5–15.5)
WBC: 5.5 10*3/uL (ref 4.0–10.5)
WBC: 5.3 10*3/uL (ref 4.0–10.5)

## 2017-07-14 LAB — BASIC METABOLIC PANEL
Anion gap: 8 (ref 5–15)
BUN: 12 mg/dL (ref 6–20)
CHLORIDE: 102 mmol/L (ref 101–111)
CO2: 25 mmol/L (ref 22–32)
CREATININE: 1.07 mg/dL (ref 0.61–1.24)
Calcium: 8.9 mg/dL (ref 8.9–10.3)
GFR calc non Af Amer: 59 mL/min — ABNORMAL LOW (ref >60)
Glucose, Bld: 114 mg/dL — ABNORMAL HIGH (ref 65–99)
POTASSIUM: 4.1 mmol/L (ref 3.5–5.1)
SODIUM: 135 mmol/L (ref 135–145)

## 2017-07-14 LAB — CREATININE, SERUM
CREATININE: 1.11 mg/dL (ref 0.61–1.24)
GFR calc Af Amer: >60 mL/min (ref >60)
GFR calc non Af Amer: 57 mL/min — ABNORMAL LOW (ref >60)

## 2017-07-14 LAB — I-STAT TROPONIN, ED: Troponin i, poc: 0 ng/mL (ref 0.00–0.08)

## 2017-07-14 LAB — GLUCOSE, CAPILLARY: GLUCOSE-CAPILLARY: 123 mg/dL — AB (ref 65–99)

## 2017-07-14 LAB — BRAIN NATRIURETIC PEPTIDE: B NATRIURETIC PEPTIDE 5: 341.8 pg/mL — AB (ref 0.0–100.0)

## 2017-07-14 MED ORDER — LISINOPRIL 10 MG PO TABS
10.0000 mg | ORAL_TABLET | Freq: Every day | ORAL | Status: DC
  Administered 2017-07-15: 10 mg via ORAL
  Filled 2017-07-14 (×2): qty 1

## 2017-07-14 MED ORDER — ASPIRIN 81 MG PO CHEW
324.0000 mg | CHEWABLE_TABLET | Freq: Once | ORAL | Status: AC
  Administered 2017-07-14: 324 mg via ORAL
  Filled 2017-07-14: qty 4

## 2017-07-14 MED ORDER — POLYETHYLENE GLYCOL 3350 17 G PO PACK: 17.0000 g | PACK | Freq: Every day | ORAL | Status: DC | PRN

## 2017-07-14 MED ORDER — ONDANSETRON HCL 4 MG PO TABS: 4.0000 mg | ORAL_TABLET | Freq: Four times a day (QID) | ORAL | Status: DC | PRN

## 2017-07-14 MED ORDER — ONDANSETRON HCL 4 MG/2ML IJ SOLN: 4.0000 mg | Freq: Four times a day (QID) | INTRAMUSCULAR | Status: DC | PRN

## 2017-07-14 MED ORDER — GABAPENTIN 100 MG PO CAPS
100.0000 mg | ORAL_CAPSULE | Freq: Two times a day (BID) | ORAL | Status: DC
  Administered 2017-07-14 – 2017-07-15 (×2): 100 mg via ORAL
  Filled 2017-07-14 (×2): qty 1

## 2017-07-14 MED ORDER — CARVEDILOL 6.25 MG PO TABS
6.2500 mg | ORAL_TABLET | Freq: Every day | ORAL | Status: DC
  Administered 2017-07-15: 6.25 mg via ORAL
  Filled 2017-07-14 (×2): qty 1

## 2017-07-14 MED ORDER — ACETAMINOPHEN 325 MG PO TABS: 650.0000 mg | ORAL_TABLET | Freq: Four times a day (QID) | ORAL | Status: DC | PRN

## 2017-07-14 MED ORDER — SIMVASTATIN 40 MG PO TABS
40.0000 mg | ORAL_TABLET | Freq: Every day | ORAL | Status: DC
  Administered 2017-07-14: 40 mg via ORAL
  Filled 2017-07-14: qty 1

## 2017-07-14 MED ORDER — ACETAMINOPHEN 650 MG RE SUPP
650.0000 mg | Freq: Four times a day (QID) | RECTAL | Status: DC | PRN
Start: 2017-07-14 — End: 2017-07-15

## 2017-07-14 MED ORDER — ENOXAPARIN SODIUM 40 MG/0.4ML ~~LOC~~ SOLN
40.0000 mg | SUBCUTANEOUS | Status: DC
  Administered 2017-07-14: 40 mg via SUBCUTANEOUS
  Filled 2017-07-14: qty 0.4

## 2017-07-14 MED ORDER — TERAZOSIN HCL 2 MG PO CAPS
2.0000 mg | ORAL_CAPSULE | Freq: Every day | ORAL | Status: DC
  Administered 2017-07-14: 2 mg via ORAL
  Filled 2017-07-14 (×2): qty 1

## 2017-07-14 MED ORDER — INSULIN ASPART 100 UNIT/ML ~~LOC~~ SOLN: 0.0000 [IU] | Freq: Three times a day (TID) | SUBCUTANEOUS | Status: DC

## 2017-07-14 MED ORDER — SODIUM CHLORIDE 0.9% FLUSH
3.0000 mL | Freq: Two times a day (BID) | INTRAVENOUS | Status: DC
  Administered 2017-07-14 – 2017-07-15 (×2): 3 mL via INTRAVENOUS

## 2017-07-14 MED ORDER — FUROSEMIDE 10 MG/ML IJ SOLN
20.0000 mg | Freq: Every day | INTRAMUSCULAR | Status: DC
Start: 2017-07-15 — End: 2017-07-15
  Administered 2017-07-15: 20 mg via INTRAVENOUS
  Filled 2017-07-14: qty 2

## 2017-07-14 MED ORDER — ASPIRIN EC 81 MG PO TBEC: 81.0000 mg | DELAYED_RELEASE_TABLET | Freq: Every day | ORAL | Status: DC

## 2017-07-14 MED ORDER — FUROSEMIDE 10 MG/ML IJ SOLN
20.0000 mg | Freq: Once | INTRAMUSCULAR | Status: AC
  Administered 2017-07-14: 20 mg via INTRAVENOUS
  Filled 2017-07-14: qty 2

## 2017-07-14 NOTE — ED Triage Notes (Signed)
Pt reports recent sob, increases with exertion and having chest discomfort. Denies swelling to extremities. Airway intact at triage. spo2 97%.

## 2017-07-14 NOTE — ED Provider Notes (Signed)
[place] EMERGENCY DEPARTMENT Provider Note   CSN: [id number] Arrival date & time: 07/14/17  1535     History   Chief Complaint Chief Complaint  Patient presents with  . Shortness of Breath    HPI Dylan [name] is a 83 y.o. male.  Patient with history of borderline diabetes, possible atrial fibrillation presents with exertional dyspnea that started this morning on his way to church. Patient denies having history of similar. Patient does have mild edema to bilateral lower extremities. Patient denies recent risk factors for blood clots. Patient had a cardiac 20 years ago that was at the time unremarkable. No history of heart attack. Patient had intermittent chest discomfort with the breathing today. Patient did not have his aspirin today. Patient is not on anticoagulants.      Past Medical History:  Diagnosis Date  . Hyperglycemia   . Hyperlipidemia   . Hypertension   . Lung nodule     Patient Active Problem List   Diagnosis Date Noted  . Atrial fibrillation ([place]) 07/14/2017  . DOE (dyspnea on exertion) 08/24/2014  . Benign essential HTN 08/24/2014  . DM II (diabetes mellitus, type II), controlled ([place]) 08/24/2014  . Dyslipidemia 08/24/2014    Past Surgical History:  Procedure Laterality Date  . APPENDECTOMY  1956  . BACK SURGERY  2008  . CARDIAC CATHETERIZATION  1995   negative results  . COLON SURGERY  07/09/12  . [place]  . HOT HEMOSTASIS  01/08/2012   Procedure: HOT HEMOSTASIS (ARGON PLASMA COAGULATION/BICAP);  Surgeon: Arta Silence, MD;  Location: [facility];  Service: Endoscopy;  Laterality: N/A;  . LAPAROSCOPIC ILEOCECECTOMY  07/09/2012   Procedure: LAPAROSCOPIC ILEOCECECTOMY;  Surgeon: Edward Jolly, MD;  Location: [facility];  Service: General;  Laterality: N/A;  Laparoscopic Ileocecectomy  . REPLACEMENT TOTAL KNEE  2010       Home Medications    Prior to Admission medications   Medication Sig Start Date  End Date Taking? Authorizing Provider  aspirin 81 MG tablet Take 81 mg by mouth at bedtime.     [provider]  carvedilol (COREG) 6.25 MG tablet Take 6.25 mg by mouth daily.  01/05/17   [provider]  cholecalciferol (VITAMIN D) 1000 UNITS tablet Take 2,000 Units by mouth daily.     [provider]  gabapentin (NEURONTIN) 100 MG capsule Take 100 mg by mouth 2 (two) times daily.    [provider]  hydrochlorothiazide (MICROZIDE) 12.5 MG capsule Take 12.5 mg by mouth every morning.     [provider]  HYDROcodone-acetaminophen (NORCO/VICODIN) 5-325 MG per tablet Take 1-2 tablets by mouth every 4 (four) hours as needed for pain. Patient not taking: Reported on 01/16/2017 07/13/12   Excell Seltzer, MD  metFORMIN (GLUCOPHAGE-XR) 500 MG 24 hr tablet Take 500 mg by mouth daily with breakfast.    [provider]  Omega-3 Fatty Acids (FISH OIL) 1200 MG CAPS Take 1 capsule by mouth 2 (two) times daily.    [provider]  simvastatin (ZOCOR) 40 MG tablet Take 40 mg by mouth daily.    [provider]  terazosin (HYTRIN) 2 MG capsule Take 2 mg by mouth at bedtime.    [provider]  valACYclovir (VALTREX) 1000 MG tablet Take 1 tablet (1,000 mg total) by mouth 3 (three) times daily. Patient not taking: Reported on 01/16/2017 08/20/15   Billy Fischer, MD  vitamin B-12 (CYANOCOBALAMIN) 1000 MCG tablet Take 1,000  mcg by mouth daily.    [provider]    Family History Family History  Problem Relation Age of Onset  . Cancer - Other Mother   . CVA Father   . Heart attack Brother   . Heart attack Maternal Grandmother     Social History Social History   Tobacco Use  . Smoking status: Former Smoker    Packs/day: 1.00    Types: Cigarettes    Last attempt to quit: 01/07/1992    Years since quitting: 25.5  . Smokeless tobacco: Never Used  Substance Use Topics  . Alcohol use: Yes    Alcohol/week: 1.2 oz     Types: 2 Glasses of wine per week  . Drug use: No     Allergies   Keflex [cephalexin]   Review of Systems Review of Systems  Constitutional: Negative for chills and fever.  HENT: Negative for congestion.   Eyes: Negative for visual disturbance.  Respiratory: Positive for shortness of breath.   Cardiovascular: Positive for chest pain and leg swelling.  Gastrointestinal: Negative for abdominal pain and vomiting.  Genitourinary: Negative for dysuria and flank pain.  Musculoskeletal: Negative for back pain, neck pain and neck stiffness.  Skin: Negative for rash.  Neurological: Negative for light-headedness and headaches.     Physical Exam Updated Vital Signs BP 135/88   Pulse 67   Temp 97.9 F (36.6 C) (Oral)   Resp 18   SpO2 97%   Physical Exam  Constitutional: He is oriented to person, place, and time. He appears well-developed and well-nourished.  HENT:  Head: Normocephalic and atraumatic.  Eyes: Conjunctivae are normal. Right eye exhibits no discharge. Left eye exhibits no discharge.  Neck: Normal range of motion. Neck supple. No tracheal deviation present.  Cardiovascular: Normal rate and regular rhythm.  Pulmonary/Chest: Effort normal and breath sounds normal.  Abdominal: Soft. He exhibits no distension. There is no tenderness. There is no guarding.  Musculoskeletal:       Right lower leg: He exhibits edema.       Left lower leg: He exhibits edema (pitting mild bilateral lower extremities).  Neurological: He is alert and oriented to person, place, and time.  Skin: Skin is warm. No rash noted.  Psychiatric: He has a normal mood and affect.  Nursing note and vitals reviewed.    ED Treatments / Results  Labs (all labs ordered are listed, but only abnormal results are displayed) Labs Reviewed  CBC - Abnormal; Notable for the following components:      Result Value   RBC 3.90 (*)    Hemoglobin 12.4 (*)    HCT 37.0 (*)    Platelets 120 (*)    All other  components within normal limits  BASIC METABOLIC PANEL  BRAIN NATRIURETIC PEPTIDE  I-STAT TROPONIN, ED    EKG  EKG Interpretation  Date/Time:  Sunday July 14 2017 15:44:21 EST Ventricular Rate:  82 PR Interval:    QRS Duration: 78 QT Interval:  388 QTC Calculation: 453 R Axis:   34 Text Interpretation:  Atrial fibrillation Abnormal ECG Confirmed by Elnora Morrison [phone]) on 07/14/2017 4:15:17 PM       Radiology Dg Chest 2 View  Result Date: 07/14/2017 CLINICAL DATA:  Shortness of breath EXAM: CHEST  2 VIEW COMPARISON:  Chest radiograph 07/04/2012 FINDINGS: Stable cardiomegaly. Tortuosity of the thoracic aorta. Bilateral perihilar interstitial opacities. No pleural effusion or pneumothorax. Thoracic spine degenerative changes. IMPRESSION: Cardiomegaly with bilateral perihilar interstitial opacities which may represent  mild edema or atypical infectious process. Electronically Signed   By: Lovey Newcomer M.D.   On: 07/14/2017 16:25    Procedures Procedures (including critical care time)  Medications Ordered in ED Medications  furosemide (LASIX) injection 20 mg (not administered)  aspirin chewable tablet 324 mg (not administered)     Initial Impression / Assessment and Plan / ED Course  I have reviewed the triage vital signs and the nursing notes.  Pertinent labs & imaging results that were available during my care of the patient were reviewed by me and considered in my medical decision making (see chart for details).     Patient presents with exertional shortness of breath and chest pain. Patient is in atrial fibrillation however denies recent history of this. Patient is not on anticoagulation. With age and medical history patient would meet recommendation chads score for anticoagulation. Patient will need to discuss this with the admitting team and primary doctor. Aspirin ordered. Discussed observation with hospitalist.  The patients results and plan were reviewed and  discussed.   Any x-rays performed were independently reviewed by myself.   Differential diagnosis were considered with the presenting HPI.  Medications  furosemide (LASIX) injection 20 mg (not administered)  aspirin chewable tablet 324 mg (not administered)    Vitals:   07/14/17 1547 07/14/17 1624  BP: 114/66 135/88  Pulse: 67   Resp: 20 18  Temp: 97.9 F (36.6 C)   TempSrc: Oral   SpO2: 97%     Final diagnoses:  Dyspnea on exertion  Persistent atrial fibrillation (HCC)  Acute chest pain    Admission/ observation were discussed with the admitting physician, patient and/or family and they are comfortable with the plan.    Final Clinical Impressions(s) / ED Diagnoses   Final diagnoses:  Dyspnea on exertion  Persistent atrial fibrillation ([place])  Acute chest pain    ED Discharge Orders    None       Elnora Morrison, MD 07/14/17 1707

## 2017-07-14 NOTE — H&P (Signed)
History and Physical    Dylan [name] AST:[id number] DOB: Jan 12, 1926 DOA: 07/14/2017  PCP: [name], MD  Patient coming from: Home  Chief Complaint: SOB and chest discomfort   HPI: Dylan [name] is a 83 y.o. male with medical history significant of HTN, HLD, DM who presents with 3-4 days of progressive shortness of breath with exertion. He states that today while at church, he started to get significantly short of breath which brought him to the emergency department.  He also complained of some chest discomfort associated with shortness of breath (although he points to his epigastric region when describing chest discomfort). He denies any fevers, chills, diaphoresis, nausea, vomiting,  abdominal pain.  He denies any orthopnea. He states that he was at one point told that he had irregular heart rhythm during a treadmill stress test about 20-25 years ago.  At that time, he was told to monitor it.  He was never told about irregular heart rhythm since then.  He had a negative myocardial perfusion imaging in 2016 with normal EF.  Wife is at bedside and states that patient has chronic lower extremity edema, however has been worsening over the past 3-4 days.  ED Course: Lab work was completed which revealed hemoglobin 12.4.  Troponin 0.  EKG revealed atrial fibrillation.  Chest x-ray showed cardiomegaly.  BMP is pending.  He was given aspirin as well as IV Lasix.  [facility] called for observation  Review of Systems: As per HPI otherwise 10 point review of systems negative.   Past Medical History:  Diagnosis Date  . Hyperglycemia   . Hyperlipidemia   . Hypertension   . Lung nodule     Past Surgical History:  Procedure Laterality Date  . APPENDECTOMY  1956  . BACK SURGERY  2008  . CARDIAC CATHETERIZATION  1995   negative results  . COLON SURGERY  07/09/12  . [place]  . HOT HEMOSTASIS  01/08/2012   Procedure: HOT HEMOSTASIS (ARGON PLASMA COAGULATION/BICAP);  Surgeon: Arta Silence,  MD;  Location: [facility];  Service: Endoscopy;  Laterality: N/A;  . LAPAROSCOPIC ILEOCECECTOMY  07/09/2012   Procedure: LAPAROSCOPIC ILEOCECECTOMY;  Surgeon: Edward Jolly, MD;  Location: [facility];  Service: General;  Laterality: N/A;  Laparoscopic Ileocecectomy  . REPLACEMENT TOTAL KNEE  2010     reports that he quit smoking about 25 years ago. His smoking use included cigarettes. He smoked 1.00 pack per day. he has never used smokeless tobacco. He reports that he drinks about 1.2 oz of alcohol per week. He reports that he does not use drugs.  Allergies  Allergen Reactions  . Keflex [Cephalexin] Hives    1980's    Family History  Problem Relation Age of Onset  . Cancer - Other Mother   . CVA Father   . Heart attack Brother   . Heart attack Maternal Grandmother     Prior to Admission medications   Medication Sig Start Date End Date Taking? Authorizing Provider  aspirin 81 MG tablet Take 81 mg by mouth at bedtime.     [provider]  carvedilol (COREG) 6.25 MG tablet Take 6.25 mg by mouth daily.  01/05/17   [provider]  cholecalciferol (VITAMIN D) 1000 UNITS tablet Take 2,000 Units by mouth daily.     [provider]  gabapentin (NEURONTIN) 100 MG capsule Take 100 mg by mouth 2 (two) times daily.    [provider]  hydrochlorothiazide (MICROZIDE) 12.5 MG capsule  Take 12.5 mg by mouth every morning.     [provider]  HYDROcodone-acetaminophen (NORCO/VICODIN) 5-325 MG per tablet Take 1-2 tablets by mouth every 4 (four) hours as needed for pain. Patient not taking: Reported on 01/16/2017 07/13/12   Excell Seltzer, MD  metFORMIN (GLUCOPHAGE-XR) 500 MG 24 hr tablet Take 500 mg by mouth daily with breakfast.    [provider]  Omega-3 Fatty Acids (FISH OIL) 1200 MG CAPS Take 1 capsule by mouth 2 (two) times daily.    [provider]  simvastatin (ZOCOR) 40 MG tablet Take 40 mg by mouth daily.    [provider]  terazosin (HYTRIN) 2 MG capsule Take 2 mg by mouth at bedtime.    [provider]  valACYclovir (VALTREX) 1000 MG tablet Take 1 tablet (1,000 mg total) by mouth 3 (three) times daily. Patient not taking: Reported on 01/16/2017 08/20/15   Billy Fischer, MD  vitamin B-12 (CYANOCOBALAMIN) 1000 MCG tablet Take 1,000 mcg by mouth daily.    [provider]    Physical Exam: Vitals:   07/14/17 1547 07/14/17 1624  BP: 114/66 135/88  Pulse: 67   Resp: 20 18  Temp: 97.9 F (36.6 C)   TempSrc: Oral   SpO2: 97%     Constitutional: NAD, calm, comfortable Eyes: PERRL, lids and conjunctivae normal ENMT: Mucous membranes are moist. Posterior pharynx clear of any exudate or lesions.Normal dentition.  Neck: normal, supple, no masses, no thyromegaly Respiratory: clear to auscultation bilaterally, no wheezing, no crackles. Normal respiratory effort. No accessory muscle use. On room air  Cardiovascular: Irregular rhythm, rate 70-90s, no murmurs / rubs / gallops. +1-2 pitting extremity edema. Abdomen: no tenderness, no masses palpated. No hepatosplenomegaly. Bowel sounds positive.  Musculoskeletal: no clubbing / cyanosis. No joint deformity upper and lower extremities. Good ROM, no contractures. Normal muscle tone.  Skin: no rashes, lesions, ulcers. No induration Neurologic: CN 2-12 grossly intact. Strength 5/5 in all 4.  Psychiatric: Normal judgment and insight. Alert and oriented x 3. Normal mood.   Labs on Admission: I have personally reviewed following labs and imaging studies  CBC: Recent Labs  Lab 07/14/17 1601  WBC 5.5  HGB 12.4*  HCT 37.0*  MCV 94.9  PLT 962*   Basic Metabolic Panel: No results for input(s): NA, K, CL, CO2, GLUCOSE, BUN, CREATININE, CALCIUM, MG, PHOS in the last 168 hours. GFR: CrCl cannot be calculated (Patient's most recent lab result is older than the maximum 21 days allowed.). Liver Function Tests: No results for input(s): AST,  ALT, ALKPHOS, BILITOT, PROT, ALBUMIN in the last 168 hours. No results for input(s): LIPASE, AMYLASE in the last 168 hours. No results for input(s): AMMONIA in the last 168 hours. Coagulation Profile: No results for input(s): INR, PROTIME in the last 168 hours. Cardiac Enzymes: No results for input(s): CKTOTAL, CKMB, CKMBINDEX, TROPONINI in the last 168 hours. BNP (last 3 results) No results for input(s): PROBNP in the last 8760 hours. HbA1C: No results for input(s): HGBA1C in the last 72 hours. CBG: No results for input(s): GLUCAP in the last 168 hours. Lipid Profile: No results for input(s): CHOL, HDL, LDLCALC, TRIG, CHOLHDL, LDLDIRECT in the last 72 hours. Thyroid Function Tests: No results for input(s): TSH, T4TOTAL, FREET4, T3FREE, THYROIDAB in the last 72 hours. Anemia Panel: No results for input(s): VITAMINB12, FOLATE, FERRITIN, TIBC, IRON, RETICCTPCT in the last 72 hours. Urine analysis:    Component Value Date/Time   COLORURINE YELLOW 01/16/2017 2106  APPEARANCEUR CLEAR 01/16/2017 2106   LABSPEC 1.013 01/16/2017 2106   PHURINE 5.0 01/16/2017 2106   GLUCOSEU NEGATIVE 01/16/2017 2106   HGBUR NEGATIVE 01/16/2017 2106   BILIRUBINUR NEGATIVE 01/16/2017 2106   KETONESUR NEGATIVE 01/16/2017 2106   PROTEINUR NEGATIVE 01/16/2017 2106   UROBILINOGEN 1.0 11/22/2009 1423   NITRITE NEGATIVE 01/16/2017 2106   LEUKOCYTESUR MODERATE (A) 01/16/2017 2106   Sepsis Labs: !!!!!!!!!!!!!!!!!!!!!!!!!!!!!!!!!!!!!!!!!!!! @LABRCNTIP (procalcitonin:4,lacticidven:4) )No results found for this or any previous visit (from the past 240 hour(s)).   Radiological Exams on Admission: Dg Chest 2 View  Result Date: 07/14/2017 CLINICAL DATA:  Shortness of breath EXAM: CHEST  2 VIEW COMPARISON:  Chest radiograph 07/04/2012 FINDINGS: Stable cardiomegaly. Tortuosity of the thoracic aorta. Bilateral perihilar interstitial opacities. No pleural effusion or pneumothorax. Thoracic spine degenerative changes.  IMPRESSION: Cardiomegaly with bilateral perihilar interstitial opacities which may represent mild edema or atypical infectious process. Electronically Signed   By: Lovey Newcomer M.D.   On: 07/14/2017 16:25    EKG: Independently reviewed. A Fib   Assessment/Plan Principal Problem:   Diastolic dysfunction with acute on chronic heart failure (HCC) Active Problems:   Benign essential HTN   DM II (diabetes mellitus, type II), controlled (HCC)   Dyslipidemia   Atrial fibrillation (HCC)   Dyspnea, suspected acute on chronic diastolic dysfunction  -Echo in 09/06/2014 with EF 61-44%, grade 1 diastolic dysfunction, now presenting with SOB with exertion, lower extremity edema, A Fib  -BNP pending  -Obtain echo -Start Lasix IV   -Start lisinopril  -Strict I/Os -Daily weight  Paroxysmal A Fib -Was diagnosed with A Fib 20-25 years ago, but told to just monitor. Not been told about A Fib since that time. No EKG indicating A fib on our records (EKG in 01/16/2017 read as A Fib but has clear P waves) -CHADSVASc >4. Discussed benefit vs risk of starting an anticoagulant in this 83yo patient. He is quite functional and states that he goes to the [facility] 4 times a week, but he has not made decision regarding starting anticoag -Currently on baby aspirin at home. Continue for now -Continue Coreg  HTN -Continue Coreg, hold Microzide since now starting lasix   HLD -Continue Zocor  Type 2 DM -Hold metformin -Check A1c -Scale insulin while inpatient  S/p right hemicolectomy secondary to ileocecal adenoma  -Noted     DVT prophylaxis: lovenox Code Status: Full, confirmed with patient and wife  Family Communication: wife at bedside Disposition Plan: pending stabilization Consults called: none  Admission status: observation    Severity of Illness: The appropriate patient status for this patient is OBSERVATION. Observation status is judged to be reasonable and necessary in order to provide the  required intensity of service to ensure the patient's safety. The patient's presenting symptoms, physical exam findings, and initial radiographic and laboratory data in the context of their medical condition is felt to place them at decreased risk for further clinical deterioration. Furthermore, it is anticipated that the patient will be medically stable for discharge from the hospital within 2 midnights of admission. The following factors support the patient status of observation.   " The patient's presenting symptoms include dyspnea. " The physical exam findings include lower extremity edema. " The initial radiographic and laboratory data are concerning for a fib .   Dessa Phi, DO [facility] [email or web address] Password [facility] 07/14/2017, 5:43 PM

## 2017-07-14 NOTE — Progress Notes (Signed)
Pt alert and oriented, denied pain at this time, patient is independent, goes to the bathroom on his own, pt refused bed alarm tonight, educated on patient's safety plan. Will continue to do hourly rounding.

## 2017-07-15 ENCOUNTER — Observation Stay (HOSPITAL_BASED_OUTPATIENT_CLINIC_OR_DEPARTMENT_OTHER): Payer: Medicare HMO

## 2017-07-15 DIAGNOSIS — I361 Nonrheumatic tricuspid (valve) insufficiency: Secondary | ICD-10-CM

## 2017-07-15 DIAGNOSIS — I5033 Acute on chronic diastolic (congestive) heart failure: Secondary | ICD-10-CM | POA: Diagnosis not present

## 2017-07-15 LAB — BASIC METABOLIC PANEL
ANION GAP: 8 (ref 5–15)
BUN: 12 mg/dL (ref 6–20)
CHLORIDE: 104 mmol/L (ref 101–111)
CO2: 27 mmol/L (ref 22–32)
Calcium: 8.9 mg/dL (ref 8.9–10.3)
Creatinine, Ser: 1.08 mg/dL (ref 0.61–1.24)
GFR calc Af Amer: >60 mL/min (ref >60)
GFR, EST NON AFRICAN AMERICAN: 59 mL/min — AB (ref >60)
GLUCOSE: 102 mg/dL — AB (ref 65–99)
POTASSIUM: 4.4 mmol/L (ref 3.5–5.1)
Sodium: 139 mmol/L (ref 135–145)

## 2017-07-15 LAB — ECHOCARDIOGRAM COMPLETE
AOASC: 39 cm
CHL CUP MV DEC (S): 197
CHL CUP RV SYS PRESS: 45 mmHg
E decel time: 197 msec
FS: 24 % — AB (ref 28–44)
HEIGHTINCHES: 72 in
IV/PV OW: 0.76
LA ID, A-P, ES: 52 mm
LA vol: 81.3 mL
LADIAMINDEX: 2.08 cm/m2
LAVOLA4C: 72.8 mL
LAVOLIN: 32.5 mL/m2
LEFT ATRIUM END SYS DIAM: 52 mm
LV PW d: 16.4 mm — AB (ref 0.6–1.1)
LVOT area: 3.8 cm2
LVOT diameter: 22 mm
MV Peak grad: 5 mmHg
MV pk A vel: 46.6 m/s
MV pk E vel: 116 m/s
Reg peak vel: 273 cm/s
TAPSE: 22.5 mm
TRMAXVEL: 273 cm/s
WEIGHTICAEL: 4196.8 [oz_av]

## 2017-07-15 LAB — CBC
HCT: 37.5 % — ABNORMAL LOW (ref 39.0–52.0)
HEMOGLOBIN: 12.3 g/dL — AB (ref 13.0–17.0)
MCH: 31.4 pg (ref 26.0–34.0)
MCHC: 32.8 g/dL (ref 30.0–36.0)
MCV: 95.7 fL (ref 78.0–100.0)
PLATELETS: 121 10*3/uL — AB (ref 150–400)
RBC: 3.92 MIL/uL — AB (ref 4.22–5.81)
RDW: 13.8 % (ref 11.5–15.5)
WBC: 6.4 10*3/uL (ref 4.0–10.5)

## 2017-07-15 LAB — GLUCOSE, CAPILLARY
GLUCOSE-CAPILLARY: 105 mg/dL — AB (ref 65–99)
GLUCOSE-CAPILLARY: 107 mg/dL — AB (ref 65–99)

## 2017-07-15 LAB — HEMOGLOBIN A1C
Hgb A1c MFr Bld: 6.1 % — ABNORMAL HIGH (ref 4.8–5.6)
Mean Plasma Glucose: 128 mg/dL

## 2017-07-15 MED ORDER — APIXABAN 5 MG PO TABS: 5.0000 mg | ORAL_TABLET | Freq: Two times a day (BID) | ORAL | Status: DC

## 2017-07-15 MED ORDER — LISINOPRIL 10 MG PO TABS: 10.0000 mg | ORAL_TABLET | Freq: Every day | ORAL | 0 refills | Status: DC

## 2017-07-15 MED ORDER — APIXABAN 5 MG PO TABS: 5.0000 mg | ORAL_TABLET | Freq: Two times a day (BID) | ORAL | 0 refills | Status: DC

## 2017-07-15 MED ORDER — FUROSEMIDE 20 MG PO TABS: 20.0000 mg | ORAL_TABLET | Freq: Every day | ORAL | 0 refills | Status: DC

## 2017-07-15 MED ORDER — PERFLUTREN LIPID MICROSPHERE
1.0000 mL | INTRAVENOUS | Status: AC | PRN
  Administered 2017-07-15: 3 mL via INTRAVENOUS
  Filled 2017-07-15: qty 10

## 2017-07-15 NOTE — Care Management Note (Signed)
Case Management Note  Patient Details  Name: Dylan [name] MRN: [id number] Date of Birth: 1926/02/17  Subjective/Objective:   CHF                Action/Plan: Patient lives at home with his spouse; PCP: [name], MD; has private insurance with [employer] with prescription drug coverage;  CM following for DCP  Expected Discharge Date:   possibly 07/19/2017               Expected Discharge Plan:  [place]   Discharge planning Services  CM Consult     Status of Service:  In process, will continue to follow  Sherrilyn Rist [phone] 07/15/2017, 11:44 AM

## 2017-07-15 NOTE — Discharge Summary (Signed)
Physician Discharge Summary  DETRAVION TESTER RXV:[id number] DOB: [birth year] DOA: 07/14/2017  PCP: [name], MD  Admit date: 07/14/2017 Discharge date: 07/15/2017  Admitted From: Home Disposition:  Home   Recommendations for Outpatient Follow-up:  1. Follow up with PCP in 1 week 2. Follow up with Dr. Fransico Him, cardiology 3. Please obtain BMP/CBC in 1 week   Discharge Condition: Stable CODE STATUS: Full  Diet recommendation: Heart healthy   Brief/Interim Summary: Dylan [name] is a 83 y.o. male with medical history significant of HTN, HLD, DM who presents with 3-4 days of progressive shortness of breath with exertion. He states that today while at church, he started to get significantly short of breath which brought him to the emergency department.  He also complained of some chest discomfort associated with shortness of breath (although he points to his epigastric region when describing chest discomfort). He denies any fevers, chills, diaphoresis, nausea, vomiting,  abdominal pain.  He denies any orthopnea. He states that he was at one point told that he had irregular heart rhythm during a treadmill stress test about 20-25 years ago.  At that time, he was told to monitor it.  He was never told about irregular heart rhythm since then.  He had a negative myocardial perfusion imaging in 2016 with normal EF.  Wife is at bedside and states that patient has chronic lower extremity edema, however has been worsening over the past 3-4 days. Lab work was completed which revealed hemoglobin 12.4.  Troponin 0.  EKG revealed atrial fibrillation.  Chest x-ray showed cardiomegaly.  He was given aspirin as well as IV Lasix.    He diuresed quite well with 20 mg IV Lasix.  He was down 6 pounds with significant improvement of bilateral lower extremity edema.  Echocardiogram was completed with results as below.  Discussion was had with patient and wife at bedside.  We discussed benefits and risks of starting  anticoagulation versus aspirin for stroke risk reduction in atrial fibrillation.  Patient is 83 years old, however very functional.  He goes to the [facility] and works out for 30 minutes on the bike and weights 4 times weekly.  He has not had a fall in over 2 years.  They are in favor of starting anticoagulation.  We also discussed that if patient's situation were to change, they could always stop anticoagulation.  He is encouraged to follow-up with his primary care physician and his cardiologist who he saw back in 2016.   Discharge Diagnoses:  Principal Problem:   Diastolic dysfunction with acute on chronic heart failure (HCC) Active Problems:   Benign essential HTN   DM II (diabetes mellitus, type II), controlled (HCC)   Dyslipidemia   Atrial fibrillation (HCC)  Acute on chronic diastolic dysfunction  -Echo in 09/06/2014 with EF 50-93%, grade 1 diastolic dysfunction, now presenting with SOB with exertion, lower extremity edema, A Fib  -BNP 341.8   -Echo EF 50-55%  -Diuresed well with IV lasix, down 6 L and improvement in lower extremity edema  -Start lisinopril, PO lasix on discharge  -Discussed low salt diet.  Patient admits to having a very big Thanksgiving meal over the weekend.  We discussed foods that are high in salt and to keep these in moderation.  Paroxysmal A Fib -Was diagnosed with A Fib 20-25 years ago, but told to just monitor. Not been told about A Fib since that time. No EKG indicating A fib on our records (EKG in 01/16/2017 read as  A Fib but has clear P waves) -CHADSVASc >4. Discussed benefit vs risk of starting an anticoagulant in this 83yo patient. He is quite functional and states that he goes to the [facility] 4 times a week. -Patient and wife in favor of starting anticoagulation for stroke prevention. Eliquis started. Appreciate CM and pharmacy.  -Continue Coreg  HTN -Continue Coreg, hold Microzide since now starting lasix   HLD -Continue Zocor  Type 2 DM -A1c 6.1    S/p right hemicolectomy secondary to ileocecal adenoma  -Noted    Discharge Instructions  Discharge Instructions    (HEART FAILURE PATIENTS) Call MD:  Anytime you have any of the following symptoms: 1) 3 pound weight gain in 24 hours or 5 pounds in 1 week 2) shortness of breath, with or without a dry hacking cough 3) swelling in the hands, feet or stomach 4) if you have to sleep on extra pillows at night in order to breathe.   Complete by:  As directed    Call MD for:  difficulty breathing, headache or visual disturbances   Complete by:  As directed    Call MD for:  extreme fatigue   Complete by:  As directed    Call MD for:  hives   Complete by:  As directed    Call MD for:  persistant dizziness or light-headedness   Complete by:  As directed    Call MD for:  persistant nausea and vomiting   Complete by:  As directed    Call MD for:  severe uncontrolled pain   Complete by:  As directed    Call MD for:  temperature >100.4   Complete by:  As directed    Diet - low sodium heart healthy   Complete by:  As directed    Discharge instructions   Complete by:  As directed    You were cared for by a hospitalist during your hospital stay. If you have any questions about your discharge medications or the care you received while you were in the hospital after you are discharged, you can call the unit and asked to speak with the hospitalist on call if the hospitalist that took care of you is not available. Once you are discharged, your primary care physician will handle any further medical issues. Please note that NO REFILLS for any discharge medications will be authorized once you are discharged, as it is imperative that you return to your primary care physician (or establish a relationship with a primary care physician if you do not have one) for your aftercare needs so that they can reassess your need for medications and monitor your lab values.   Increase activity slowly   Complete by:  As  directed      Allergies as of 07/15/2017      Reactions   Keflex [cephalexin] Hives   1980's      Medication List    STOP taking these medications   aspirin 81 MG tablet   hydrochlorothiazide 12.5 MG capsule Commonly known as:  MICROZIDE     TAKE these medications   apixaban 5 MG Tabs tablet Commonly known as:  ELIQUIS Take 1 tablet (5 mg total) by mouth 2 (two) times daily.   carvedilol 6.25 MG tablet Commonly known as:  COREG Take 6.25 mg by mouth daily.   cholecalciferol 1000 units tablet Commonly known as:  VITAMIN D Take 2,000 Units by mouth daily.   Fish Oil 1200 MG Caps Take 1 capsule by  mouth 2 (two) times daily.   furosemide 20 MG tablet Commonly known as:  LASIX Take 1 tablet (20 mg total) by mouth daily.   gabapentin 100 MG capsule Commonly known as:  NEURONTIN Take 100 mg by mouth 2 (two) times daily.   lisinopril 10 MG tablet Commonly known as:  PRINIVIL,ZESTRIL Take 1 tablet (10 mg total) by mouth daily. Start taking on:  07/16/2017   metFORMIN 500 MG 24 hr tablet Commonly known as:  GLUCOPHAGE-XR Take 500 mg by mouth daily with breakfast.   simvastatin 40 MG tablet Commonly known as:  ZOCOR Take 40 mg by mouth daily.   terazosin 2 MG capsule Commonly known as:  HYTRIN Take 2 mg by mouth at bedtime.   vitamin B-12 1000 MCG tablet Commonly known as:  CYANOCOBALAMIN Take 1,000 mcg by mouth daily.      Follow-up Information    [name], MD. Go on 07/23/2017.   Specialty:  Internal Medicine Why:  Repeat BMP in 1 week @10 :00am Contact information: [address] [place] [address] [phone]        Sueanne Margarita, MD. Go on 07/22/2017.   Specialty:  Cardiology Why:  @12  N Contact information: [address] [place] [address] [phone]          Allergies  Allergen Reactions  . Keflex [Cephalexin] Hives    1980's    Consultations:  None   Procedures/Studies: Dg Chest 2  View  Result Date: 07/14/2017 CLINICAL DATA:  Shortness of breath EXAM: CHEST  2 VIEW COMPARISON:  Chest radiograph 07/04/2012 FINDINGS: Stable cardiomegaly. Tortuosity of the thoracic aorta. Bilateral perihilar interstitial opacities. No pleural effusion or pneumothorax. Thoracic spine degenerative changes. IMPRESSION: Cardiomegaly with bilateral perihilar interstitial opacities which may represent mild edema or atypical infectious process. Electronically Signed   By: Lovey Newcomer M.D.   On: 07/14/2017 16:25    Echo Study Conclusions  - Left ventricle: The cavity size was normal. There was   mild-moderate concentric hypertrophy. Systolic function was   normal. The estimated ejection fraction was in the range of 50%   to 55%. Wall motion was normal; there were no regional wall   motion abnormalities. - Aortic valve: Transvalvular velocity was within the normal range.   There was no stenosis. There was mild regurgitation. - Mitral valve: Transvalvular velocity was within the normal range.   There was no evidence for stenosis. There was mild regurgitation. - Left atrium: The atrium was mildly dilated. - Right ventricle: The cavity size was normal. Wall thickness was   normal. Systolic function was normal. - Atrial septum: No defect or patent foramen ovale was identified   by color flow Doppler. - Tricuspid valve: There was mild regurgitation. - Pulmonary arteries: Systolic pressure was mildly increased. PA   peak pressure: 45 mm Hg (S).    Discharge Exam: Vitals:   07/15/17 0431 07/15/17 0820  BP: 127/67 124/63  Pulse: 80 (!) 115  Resp: 20   Temp: 98.4 F (36.9 C)   SpO2: 92% 98%   Vitals:   07/14/17 1944 07/15/17 0026 07/15/17 0431 07/15/17 0820  BP: 112/72 104/60 127/67 124/63  Pulse: (!) 55 (!) 108 80 (!) 115  Resp: 20 20 20    Temp: 97.9 F (36.6 C) 97.7 F (36.5 C) 98.4 F (36.9 C)   TempSrc: Oral Oral Oral   SpO2: 94% 98% 92% 98%  Weight:   119 kg (262 lb 4.8 oz)  Height:        General: Pt is alert, awake, not in acute distress Cardiovascular: Irregular rhythm, rate controlled, S1/S2 +, no rubs, no gallops Respiratory: CTA bilaterally, no wheezing, no rhonchi Abdominal: Soft, NT, ND, bowel sounds + Extremities: trace edema, much improved from previous exam, no cyanosis    The results of significant diagnostics from this hospitalization (including imaging, microbiology, ancillary and laboratory) are listed below for reference.     Microbiology: No results found for this or any previous visit (from the past 240 hour(s)).   Labs: BNP (last 3 results) Recent Labs    07/14/17 1601  BNP 702.6*   Basic Metabolic Panel: Recent Labs  Lab 07/14/17 1601 07/14/17 1825 07/15/17 0409  NA 135  --  139  K 4.1  --  4.4  CL 102  --  104  CO2 25  --  27  GLUCOSE 114*  --  102*  BUN 12  --  12  CREATININE 1.07 1.11 1.08  CALCIUM 8.9  --  8.9   Liver Function Tests: No results for input(s): AST, ALT, ALKPHOS, BILITOT, PROT, ALBUMIN in the last 168 hours. No results for input(s): LIPASE, AMYLASE in the last 168 hours. No results for input(s): AMMONIA in the last 168 hours. CBC: Recent Labs  Lab 07/14/17 1601 07/14/17 1825 07/15/17 0409  WBC 5.5 5.3 6.4  HGB 12.4* 13.0 12.3*  HCT 37.0* 39.8 37.5*  MCV 94.9 96.4 95.7  PLT 120* 133* 121*   Cardiac Enzymes: No results for input(s): CKTOTAL, CKMB, CKMBINDEX, TROPONINI in the last 168 hours. BNP: Invalid input(s): POCBNP CBG: Recent Labs  Lab 07/14/17 2057 07/15/17 0800 07/15/17 1221  GLUCAP 123* 105* 107*   D-Dimer No results for input(s): DDIMER in the last 72 hours. Hgb A1c Recent Labs    07/14/17 1825  HGBA1C 6.1*   Lipid Profile No results for input(s): CHOL, HDL, LDLCALC, TRIG, CHOLHDL, LDLDIRECT in the last 72 hours. Thyroid function studies No results for input(s): TSH, T4TOTAL, T3FREE, THYROIDAB in the last 72 hours.  Invalid input(s): FREET3 Anemia work up No  results for input(s): VITAMINB12, FOLATE, FERRITIN, TIBC, IRON, RETICCTPCT in the last 72 hours. Urinalysis    Component Value Date/Time   COLORURINE YELLOW 01/16/2017 2106   APPEARANCEUR CLEAR 01/16/2017 2106   LABSPEC 1.013 01/16/2017 2106   PHURINE 5.0 01/16/2017 2106   GLUCOSEU NEGATIVE 01/16/2017 2106   HGBUR NEGATIVE 01/16/2017 2106   BILIRUBINUR NEGATIVE 01/16/2017 2106   KETONESUR NEGATIVE 01/16/2017 2106   PROTEINUR NEGATIVE 01/16/2017 2106   UROBILINOGEN 1.0 11/22/2009 1423   NITRITE NEGATIVE 01/16/2017 2106   LEUKOCYTESUR MODERATE (A) 01/16/2017 2106   Sepsis Labs Invalid input(s): PROCALCITONIN,  WBC,  LACTICIDVEN Microbiology No results found for this or any previous visit (from the past 240 hour(s)).   Time coordinating discharge: 40 minutes  SIGNED:  Dessa Phi, DO [facility] Pager [phone]  If 7PM-7AM, please contact night-coverage [email or web address] Password [facility] 07/15/2017, 2:48 PM

## 2017-07-15 NOTE — Progress Notes (Addendum)
Benefit check in progress for Eliquis. Aneta Mins [phone]  Memory Argue CM        # 8. S/W [employer] @ [employer] [place] # [phone] OTP- 2   1. ELIQUIS 50 MG BID   COVER- YES  CO-PAY- $ 45.00  TIER-3 DRUG  PRIOR APPROVAL- NO   PREFERRED PHARMACY : [facility]

## 2017-07-15 NOTE — Progress Notes (Signed)
PT Cancellation Note  Patient Details Name: Dylan [name] MRN: [id number] DOB: Jul 09, 1926   Cancelled Treatment:    Reason Eval/Treat Not Completed: Patient at procedure or test/unavailable(In Echo lab currently. Will f/u at later time.  Thanks. )   Godfrey Pick [name] 07/15/2017, 11:18 AM  [facility] [phone] (pager)

## 2017-07-15 NOTE — Evaluation (Signed)
Physical Therapy Evaluation Patient Details Name: Dylan [name] MRN: [id number] DOB: 1926/08/13 Today's Date: 07/15/2017   History of Present Illness  Pt admit for SOB and CP.  CHF.   Clinical Impression  Pt admitted with above diagnosis. Pt currently with functional limitations due to the deficits listed below (see PT Problem List). A few LOB without device with ambulation with PT encouraging pt to use RW at all times at home and pt agrees.  Sats also measured 91-92% at rest and did not register with ambulation.  94% after walk.  Would be good to have O2 sats checked at home as well.  Will follow acutely. Pt will benefit from skilled PT to increase their independence and safety with mobility to allow discharge to the venue listed below.      Follow Up Recommendations Home health PT;Supervision/Assistance - 24 hour    Equipment Recommendations  None recommended by PT    Recommendations for Other Services       Precautions / Restrictions Precautions Precautions: Fall Restrictions Weight Bearing Restrictions: No      Mobility  Bed Mobility Overal bed mobility: Independent                Transfers Overall transfer level: Needs assistance Equipment used: None Transfers: Sit to/from Stand Sit to Stand: Supervision;Min guard         General transfer comment: Slightly unsteady on standing.   Ambulation/Gait Ambulation/Gait assistance: Min assist Ambulation Distance (Feet): 75 Feet Assistive device: None Gait Pattern/deviations: Step-through pattern;Decreased stride length;Staggering left;Staggering right;Trunk flexed   Gait velocity interpretation: Below normal speed for age/gender General Gait Details: Pt with several LOB with pt reaching for furniture and hall rails.  Pt denying LOB but definitely unsteady.  Informed pt that he needs to have device with him at all times for ultimate safety.    Stairs            Wheelchair Mobility    Modified Rankin  (Stroke Patients Only)       Balance Overall balance assessment: Needs assistance Sitting-balance support: No upper extremity supported;Feet supported Sitting balance-[name]: Good     Standing balance support: During functional activity;No upper extremity supported Standing balance-[name]: Poor Standing balance comment: Several LOB with ambulation without challenges.                              Pertinent Vitals/Pain Pain Assessment: No/denies pain    Home Living Family/patient expects to be discharged to:: Private residence Living Arrangements: Spouse/significant other Available Help at Discharge: Family;Available 24 hours/day Type of Home: House Home Access: Stairs to enter;Ramped entrance   Entrance Stairs-Number of Steps: 2 Home Layout: One level Home Equipment: Walker - 2 wheels;Cane - single point;Shower seat;Bedside commode;Grab bars - tub/shower      Prior Function Level of Independence: Independent;Independent with assistive device(s)         Comments: Wife states he used cane outdoors     Hand Dominance        Extremity/Trunk Assessment   Upper Extremity Assessment Upper Extremity Assessment: Defer to OT evaluation    Lower Extremity Assessment Lower Extremity Assessment: Generalized weakness    Cervical / Trunk Assessment Cervical / Trunk Assessment: Normal  Communication   Communication: No difficulties  Cognition Arousal/Alertness: Awake/alert Behavior During Therapy: Impulsive Overall Cognitive Status: Within Functional Limits for tasks assessed  General Comments General comments (skin integrity, edema, etc.): Pt sats registered at 86% initially and tried a different finger and it read 92%.  Ambulated and sats would not register but then did finally register once pt sat down at 94%.  DOE 3/4.     Exercises     Assessment/Plan    PT Assessment Patient needs  continued PT services  PT Problem List Decreased activity tolerance;Decreased balance;Decreased mobility;Decreased knowledge of use of DME;Decreased safety awareness;Decreased knowledge of precautions;Cardiopulmonary status limiting activity       PT Treatment Interventions DME instruction;Gait training;Therapeutic activities;Therapeutic exercise;Balance training;Functional mobility training;Patient/family education    PT Goals (Current goals can be found in the Care Plan section)  Acute Rehab PT Goals Patient Stated Goal: to go home PT Goal Formulation: With patient Time For Goal Achievement: 07/29/17 Potential to Achieve Goals: Good    Frequency Min 3X/week   Barriers to discharge        Co-evaluation               AM-PAC PT "6 Clicks" Daily Activity  Outcome Measure Difficulty turning over in bed (including adjusting bedclothes, sheets and blankets)?: None Difficulty moving from lying on back to sitting on the side of the bed? : None Difficulty sitting down on and standing up from a chair with arms (e.g., wheelchair, bedside commode, etc,.)?: A Little Help needed moving to and from a bed to chair (including a wheelchair)?: A Little Help needed walking in hospital room?: A Lot Help needed climbing 3-5 steps with a railing? : Total 6 Click Score: 17    End of Session Equipment Utilized During Treatment: Gait belt Activity Tolerance: Patient limited by fatigue Patient left: in chair;with call bell/phone within reach;with family/visitor present Nurse Communication: Mobility status PT Visit Diagnosis: Unsteadiness on feet (R26.81);Muscle weakness (generalized) (M62.81)    Time: 0623-7628 PT Time Calculation (min) (ACUTE ONLY): 11 min   Charges:   PT Evaluation $PT Eval Moderate Complexity: 1 Mod     PT G Codes:   PT G-Codes **NOT FOR INPATIENT CLASS** Functional Assessment Tool Used: AM-PAC 6 Clicks Basic Mobility Functional Limitation: Mobility: Walking and  moving around Mobility: Walking and Moving Around Current Status (B1517): At least 20 percent but less than 40 percent impaired, limited or restricted Mobility: Walking and Moving Around Goal Status [phone]): At least 1 percent but less than 20 percent impaired, limited or restricted    [place] [phone] (pager)   Denice Paradise 07/15/2017, 1:53 PM

## 2017-07-15 NOTE — Progress Notes (Signed)
ANTICOAGULATION CONSULT NOTE - Initial Consult  Pharmacy Consult for Apixaban (Eliquis)  Indication:   Nonvalvular atrial fibrillation    Allergies  Allergen Reactions  . Keflex [Cephalexin] Hives    1980's    Patient Measurements: Height: 6' (182.9 cm) Weight: 262 lb 4.8 oz (119 kg) IBW/kg (Calculated) : 77.6   Vital Signs: Temp: 98.4 F (36.9 C) (11/26 0431) Temp Source: Oral (11/26 0431) BP: 124/63 (11/26 0820) Pulse Rate: 115 (11/26 0820)  Labs: Recent Labs    07/14/17 1601 07/14/17 1825 07/15/17 0409  HGB 12.4* 13.0 12.3*  HCT 37.0* 39.8 37.5*  PLT 120* 133* 121*  CREATININE 1.07 1.11 1.08    Estimated Creatinine Clearance: 61.8 mL/min (by C-G formula based on SCr of 1.08 mg/dL).   Medical History: Past Medical History:  Diagnosis Date  . Hyperglycemia   . Hyperlipidemia   . Hypertension   . Lung nodule     Medications:  Medications Prior to Admission  Medication Sig Dispense Refill Last Dose  . aspirin 81 MG tablet Take 81 mg by mouth at bedtime.    01/15/2017 at Unknown time  . carvedilol (COREG) 6.25 MG tablet Take 6.25 mg by mouth daily.    01/16/2017 at 0530  . cholecalciferol (VITAMIN D) 1000 UNITS tablet Take 2,000 Units by mouth daily.    01/16/2017 at Unknown time  . gabapentin (NEURONTIN) 100 MG capsule Take 100 mg by mouth 2 (two) times daily.   01/16/2017 at Unknown time  . hydrochlorothiazide (MICROZIDE) 12.5 MG capsule Take 12.5 mg by mouth every morning.    01/16/2017 at Unknown time  . metFORMIN (GLUCOPHAGE-XR) 500 MG 24 hr tablet Take 500 mg by mouth daily with breakfast.   01/16/2017 at Unknown time  . Omega-3 Fatty Acids (FISH OIL) 1200 MG CAPS Take 1 capsule by mouth 2 (two) times daily.   01/16/2017 at Unknown time  . simvastatin (ZOCOR) 40 MG tablet Take 40 mg by mouth daily.   01/16/2017 at Unknown time  . terazosin (HYTRIN) 2 MG capsule Take 2 mg by mouth at bedtime.   01/15/2017 at Unknown time  . vitamin B-12 (CYANOCOBALAMIN) 1000  MCG tablet Take 1,000 mcg by mouth daily.   01/16/2017 at Unknown time   Scheduled:  . aspirin EC  81 mg Oral QHS  . carvedilol  6.25 mg Oral Daily  . enoxaparin (LOVENOX) injection  40 mg Subcutaneous Q24H  . furosemide  20 mg Intravenous Daily  . gabapentin  100 mg Oral BID  . insulin aspart  0-9 Units Subcutaneous TID WC  . lisinopril  10 mg Oral Daily  . simvastatin  40 mg Oral q1800  . sodium chloride flush  3 mL Intravenous Q12H  . terazosin  2 mg Oral QHS    Assessment: 83 y.o male admitted with chief complaint of shortness of breath and chest discomfort. Patient is not on anticoagulants PTA.  Pharmacy consulted to start patient on Eliquis (apixaban) for Atrial fibrillation.  Case manager noted that benefit check is in progress for Eliquis.  Lovenox 40mg  SQ given on 07/14/17 @ 20:00  SCr = 1.08, CrCl ~ 60 ml/min H/H = 12.3/37.5 PLTC = 121 k No bleeding reported.    Goal of Therapy:  Monitor platelets by anticoagulation protocol: Yes   Plan:  Discontinue SQ lovenox (VTE ppx)  Start Eliquis 5 mg po BID Educate patient about Eliquis   Thank you for allowing pharmacy to be part of this patients care team. Nicole Cella, RPh  Clinical Pharmacist Pager: [phone] 8A-[phone] [place] [phone] 07/15/2017,1:31 PM

## 2017-07-15 NOTE — Discharge Instructions (Signed)
Atrial Fibrillation Atrial fibrillation is a type of heartbeat that is irregular or fast (rapid). If you have this condition, your heart keeps quivering in a weird (chaotic) way. This condition can make it so your heart cannot pump blood normally. Having this condition gives a person more risk for stroke, heart failure, and other heart problems. There are different types of atrial fibrillation. Talk with your doctor to learn about the type that you have. Follow these instructions at home:  Take over-the-counter and prescription medicines only as told by your doctor.  If your doctor prescribed a blood-thinning medicine, take it exactly as told. Taking too much of it can cause bleeding. If you do not take enough of it, you will not have the protection that you need against stroke and other problems.  Do not use any tobacco products. These include cigarettes, chewing tobacco, and e-cigarettes. If you need help quitting, ask your doctor.  If you have apnea (obstructive sleep apnea), manage it as told by your doctor.  Do not drink alcohol.  Do not drink beverages that have caffeine. These include coffee, soda, and tea.  Maintain a healthy weight. Do not use diet pills unless your doctor says they are safe for you. Diet pills may make heart problems worse.  Follow diet instructions as told by your doctor.  Exercise regularly as told by your doctor.  Keep all follow-up visits as told by your doctor. This is important. Contact a doctor if:  You notice a change in the speed, rhythm, or strength of your heartbeat.  You are taking a blood-thinning medicine and you notice more bruising.  You get tired more easily when you move or exercise. Get help right away if:  You have pain in your chest or your belly (abdomen).  You have sweating or weakness.  You feel sick to your stomach (nauseous).  You notice blood in your throw up (vomit), poop (stool), or pee (urine).  You are short of  breath.  You suddenly have swollen feet and ankles.  You feel dizzy.  Your suddenly get weak or numb in your face, arms, or legs, especially if it happens on one side of your body.  You have trouble talking, trouble understanding, or both.  Your face or your eyelid droops on one side. These symptoms may be an emergency. Do not wait to see if the symptoms will go away. Get medical help right away. Call your local emergency services (911 in the [place] Do not drive yourself to the hospital. This information is not intended to replace advice given to you by your health care provider. Make sure you discuss any questions you have with your health care provider. Document Released: 05/15/2008 Document Revised: 01/12/2016 Document Reviewed: 12/01/2014 Elsevier Interactive Patient Education  2018 [place] Eating Plan DASH stands for "Dietary Approaches to Stop Hypertension." The DASH eating plan is a healthy eating plan that has been shown to reduce high blood pressure (hypertension). It may also reduce your risk for type 2 diabetes, heart disease, and stroke. The DASH eating plan may also help with weight loss. What are tips for following this plan? General guidelines  Avoid eating more than 2,300 mg (milligrams) of salt (sodium) a day. If you have hypertension, you may need to reduce your sodium intake to 1,500 mg a day.  Limit alcohol intake to no more than 1 drink a day for nonpregnant women and 2 drinks a day for men. One drink equals  12 oz of beer, 5 oz of wine, or 1 oz of hard liquor.  Work with your health care provider to maintain a healthy body weight or to lose weight. Ask what an ideal weight is for you.  Get at least 30 minutes of exercise that causes your heart to beat faster (aerobic exercise) most days of the week. Activities may include walking, swimming, or biking.  Work with your health care provider or diet and nutrition specialist (dietitian) to adjust your  eating plan to your individual calorie needs. Reading food labels  Check food labels for the amount of sodium per serving. Choose foods with less than 5 percent of the Daily Value of sodium. Generally, foods with less than 300 mg of sodium per serving fit into this eating plan.  To find whole grains, look for the word "whole" as the first word in the ingredient list. Shopping  Buy products labeled as "low-sodium" or "no salt added."  Buy fresh foods. Avoid canned foods and premade or frozen meals. Cooking  Avoid adding salt when cooking. Use salt-free seasonings or herbs instead of table salt or sea salt. Check with your health care provider or pharmacist before using salt substitutes.  Do not fry foods. Cook foods using healthy methods such as baking, boiling, grilling, and broiling instead.  Cook with heart-healthy oils, such as olive, canola, soybean, or sunflower oil. Meal planning   Eat a balanced diet that includes: ? 5 or more servings of fruits and vegetables each day. At each meal, try to fill half of your plate with fruits and vegetables. ? Up to 6-8 servings of whole grains each day. ? Less than 6 oz of lean meat, poultry, or fish each day. A 3-oz serving of meat is about the same size as a deck of cards. One egg equals 1 oz. ? 2 servings of low-fat dairy each day. ? A serving of nuts, seeds, or beans 5 times each week. ? Heart-healthy fats. Healthy fats called Omega-3 fatty acids are found in foods such as flaxseeds and coldwater fish, like sardines, salmon, and mackerel.  Limit how much you eat of the following: ? Canned or prepackaged foods. ? Food that is high in trans fat, such as fried foods. ? Food that is high in saturated fat, such as fatty meat. ? Sweets, desserts, sugary drinks, and other foods with added sugar. ? Full-fat dairy products.  Do not salt foods before eating.  Try to eat at least 2 vegetarian meals each week.  Eat more home-cooked food and  less restaurant, buffet, and fast food.  When eating at a restaurant, ask that your food be prepared with less salt or no salt, if possible. What foods are recommended? The items listed may not be a complete list. Talk with your dietitian about what dietary choices are best for you. Grains Whole-grain or whole-wheat bread. Whole-grain or whole-wheat pasta. Brown rice. [place]. Bulgur. Whole-grain and low-sodium cereals. Pita bread. Low-fat, low-sodium crackers. Whole-wheat flour tortillas. Vegetables Fresh or frozen vegetables (raw, steamed, roasted, or grilled). Low-sodium or reduced-sodium tomato and vegetable juice. Low-sodium or reduced-sodium tomato sauce and tomato paste. Low-sodium or reduced-sodium canned vegetables. Fruits All fresh, dried, or frozen fruit. Canned fruit in natural juice (without added sugar). Meat and other protein foods Skinless chicken or [place]. Ground chicken or [place]. Pork with fat trimmed off. Fish and seafood. Egg whites. Dried beans, peas, or lentils. Unsalted nuts, nut butters, and seeds. Unsalted canned beans. Lean cuts of  beef with fat trimmed off. Low-sodium, lean deli meat. Dairy Low-fat (1%) or fat-free (skim) milk. Fat-free, low-fat, or reduced-fat cheeses. Nonfat, low-sodium ricotta or cottage cheese. Low-fat or nonfat yogurt. Low-fat, low-sodium cheese. Fats and oils Soft margarine without trans fats. Vegetable oil. Low-fat, reduced-fat, or light mayonnaise and salad dressings (reduced-sodium). Canola, safflower, olive, soybean, and sunflower oils. Avocado. Seasoning and other foods Herbs. Spices. Seasoning mixes without salt. Unsalted popcorn and pretzels. Fat-free sweets. What foods are not recommended? The items listed may not be a complete list. Talk with your dietitian about what dietary choices are best for you. Grains Baked goods made with fat, such as croissants, muffins, or some breads. Dry pasta or rice meal  packs. Vegetables Creamed or fried vegetables. Vegetables in a cheese sauce. Regular canned vegetables (not low-sodium or reduced-sodium). Regular canned tomato sauce and paste (not low-sodium or reduced-sodium). Regular tomato and vegetable juice (not low-sodium or reduced-sodium). Angie Fava. Olives. Fruits Canned fruit in a light or heavy syrup. Fried fruit. Fruit in cream or butter sauce. Meat and other protein foods Fatty cuts of meat. Ribs. Fried meat. Berniece Salines. Sausage. Bologna and other processed lunch meats. Salami. Fatback. Hotdogs. Bratwurst. Salted nuts and seeds. Canned beans with added salt. Canned or smoked fish. Whole eggs or egg yolks. Chicken or [place] with skin. Dairy Whole or 2% milk, cream, and half-and-half. Whole or full-fat cream cheese. Whole-fat or sweetened yogurt. Full-fat cheese. Nondairy creamers. Whipped toppings. Processed cheese and cheese spreads. Fats and oils Butter. Stick margarine. Lard. Shortening. Ghee. Bacon fat. Tropical oils, such as coconut, palm kernel, or palm oil. Seasoning and other foods Salted popcorn and pretzels. Onion salt, garlic salt, seasoned salt, table salt, and sea salt. Worcestershire sauce. Tartar sauce. Barbecue sauce. Teriyaki sauce. Soy sauce, including reduced-sodium. Steak sauce. Canned and packaged gravies. Fish sauce. Oyster sauce. Cocktail sauce. Horseradish that you find on the shelf. Ketchup. Mustard. Meat flavorings and tenderizers. Bouillon cubes. Hot sauce and Tabasco sauce. Premade or packaged marinades. Premade or packaged taco seasonings. Relishes. Regular salad dressings. Where to find more information:  National Heart, Lung, and [place]: https://wilson-eaton.com/  American Heart Association: www.heart.org Summary  The DASH eating plan is a healthy eating plan that has been shown to reduce high blood pressure (hypertension). It may also reduce your risk for type 2 diabetes, heart disease, and stroke.  With the DASH eating  plan, you should limit salt (sodium) intake to 2,300 mg a day. If you have hypertension, you may need to reduce your sodium intake to 1,500 mg a day.  When on the DASH eating plan, aim to eat more fresh fruits and vegetables, whole grains, lean proteins, low-fat dairy, and heart-healthy fats.  Work with your health care provider or diet and nutrition specialist (dietitian) to adjust your eating plan to your individual calorie needs. This information is not intended to replace advice given to you by your health care provider. Make sure you discuss any questions you have with your health care provider. Document Released: 07/26/2011 Document Revised: 07/30/2016 Document Reviewed: 07/30/2016 Elsevier Interactive Patient Education  2017 [place] on my medicine - ELIQUIS (apixaban)  This medication education was reviewed with me or my healthcare representative as part of my discharge preparation.   Why was Eliquis prescribed for you? Eliquis was prescribed for you to reduce the risk of a blood clot forming that can cause a stroke if you have a medical condition called atrial fibrillation (a type of irregular heartbeat).  What  do You need to know about Eliquis ? Take your Eliquis TWICE DAILY - one tablet in the morning and one tablet in the evening with or without food. If you have difficulty swallowing the tablet whole please discuss with your pharmacist how to take the medication safely.  Take Eliquis exactly as prescribed by your doctor and DO NOT stop taking Eliquis without talking to the doctor who prescribed the medication.  Stopping may increase your risk of developing a stroke.  Refill your prescription before you run out.  After discharge, you should have regular check-up appointments with your healthcare provider that is prescribing your Eliquis.  In the future your dose may need to be changed if your kidney function or weight changes by a significant amount or as  you get older.  What do you do if you miss a dose? If you miss a dose, take it as soon as you remember on the same day and resume taking twice daily.  Do not take more than one dose of ELIQUIS at the same time to make up a missed dose.  Important Safety Information A possible side effect of Eliquis is bleeding. You should call your healthcare provider right away if you experience any of the following: ? Bleeding from an injury or your nose that does not stop. ? Unusual colored urine (red or dark brown) or unusual colored stools (red or black). ? Unusual bruising for unknown reasons. ? A serious fall or if you hit your head (even if there is no bleeding).  Some medicines may interact with Eliquis and might increase your risk of bleeding or clotting while on Eliquis. To help avoid this, consult your healthcare provider or pharmacist prior to using any new prescription or non-prescription medications, including herbals, vitamins, non-steroidal anti-inflammatory drugs (NSAIDs) and supplements.  This website has more information on Eliquis (apixaban): http://www.eliquis.com/eliquis/home

## 2017-07-15 NOTE — Progress Notes (Signed)
  Echocardiogram 2D Echocardiogram has been performed.  Delayla Hoffmaster G Aileana Hodder 07/15/2017, 11:09 AM

## 2017-07-22 ENCOUNTER — Encounter: Payer: Self-pay | Admitting: Physician Assistant

## 2017-07-22 ENCOUNTER — Encounter (INDEPENDENT_AMBULATORY_CARE_PROVIDER_SITE_OTHER): Payer: Self-pay

## 2017-07-22 ENCOUNTER — Ambulatory Visit: Payer: Medicare HMO | Admitting: Physician Assistant

## 2017-07-22 VITALS — BP 116/76 | HR 62 | Ht 72.0 in | Wt 265.8 lb

## 2017-07-22 DIAGNOSIS — I4819 Other persistent atrial fibrillation: Secondary | ICD-10-CM

## 2017-07-22 DIAGNOSIS — I10 Essential (primary) hypertension: Secondary | ICD-10-CM

## 2017-07-22 DIAGNOSIS — I481 Persistent atrial fibrillation: Secondary | ICD-10-CM | POA: Diagnosis not present

## 2017-07-22 DIAGNOSIS — I5033 Acute on chronic diastolic (congestive) heart failure: Secondary | ICD-10-CM

## 2017-07-22 MED ORDER — CARVEDILOL 12.5 MG PO TABS: 12.5000 mg | ORAL_TABLET | Freq: Two times a day (BID) | ORAL | 3 refills | Status: DC

## 2017-07-22 MED ORDER — FUROSEMIDE 20 MG PO TABS: 20.0000 mg | ORAL_TABLET | Freq: Every day | ORAL | 3 refills | Status: DC

## 2017-07-22 NOTE — Patient Instructions (Addendum)
Medication Instructions:  Your physician has recommended you make the following change in your medication:  1-Increase Carvedilol 12.5 mg by mouth twice daily 2-Increase Lasix 40 mg by mouth for 3 days, then back to Lasix 20 mg by mouth daily.   Labwork: NONE  Testing/Procedures: NONE  Follow-Up: Your physician wants you to follow-up in: 1 week with Dylan Barrios PA.    If you need a refill on your cardiac medications before your next appointment, please call your pharmacy.    Low-Sodium Eating Plan Sodium, which is an element that makes up salt, helps you maintain a healthy balance of fluids in your body. Too much sodium can increase your blood pressure and cause fluid and waste to be held in your body. Your health care provider or dietitian may recommend following this plan if you have high blood pressure (hypertension), kidney disease, liver disease, or heart failure. Eating less sodium can help lower your blood pressure, reduce swelling, and protect your heart, liver, and kidneys. What are tips for following this plan? General guidelines  Most people on this plan should limit their sodium intake to 1,500-2,000 mg (milligrams) of sodium each day. Reading food labels  The Nutrition Facts label lists the amount of sodium in one serving of the food. If you eat more than one serving, you must multiply the listed amount of sodium by the number of servings.  Choose foods with less than 140 mg of sodium per serving.  Avoid foods with 300 mg of sodium or more per serving. Shopping  Look for lower-sodium products, often labeled as "low-sodium" or "no salt added."  Always check the sodium content even if foods are labeled as "unsalted" or "no salt added".  Buy fresh foods. ? Avoid canned foods and premade or frozen meals. ? Avoid canned, cured, or processed meats  Buy breads that have less than 80 mg of sodium per slice. Cooking  Eat more home-cooked food and less restaurant,  buffet, and fast food.  Avoid adding salt when cooking. Use salt-free seasonings or herbs instead of table salt or sea salt. Check with your health care provider or pharmacist before using salt substitutes.  Cook with plant-based oils, such as canola, sunflower, or olive oil. Meal planning  When eating at a restaurant, ask that your food be prepared with less salt or no salt, if possible.  Avoid foods that contain MSG (monosodium glutamate). MSG is sometimes added to [place] food, bouillon, and some canned foods. What foods are recommended? The items listed may not be a complete list. Talk with your dietitian about what dietary choices are best for you. Grains Low-sodium cereals, including oats, puffed wheat and rice, and shredded wheat. Low-sodium crackers. Unsalted rice. Unsalted pasta. Low-sodium bread. Whole-grain breads and whole-grain pasta. Vegetables Fresh or frozen vegetables. "No salt added" canned vegetables. "No salt added" tomato sauce and paste. Low-sodium or reduced-sodium tomato and vegetable juice. Fruits Fresh, frozen, or canned fruit. Fruit juice. Meats and other protein foods Fresh or frozen (no salt added) meat, poultry, seafood, and fish. Low-sodium canned tuna and salmon. Unsalted nuts. Dried peas, beans, and lentils without added salt. Unsalted canned beans. Eggs. Unsalted nut butters. Dairy Milk. Soy milk. Cheese that is naturally low in sodium, such as ricotta cheese, fresh mozzarella, or Swiss cheese Low-sodium or reduced-sodium cheese. Cream cheese. Yogurt. Fats and oils Unsalted butter. Unsalted margarine with no trans fat. Vegetable oils such as canola or olive oils. Seasonings and other foods Fresh and dried herbs and  spices. Salt-free seasonings. Low-sodium mustard and ketchup. Sodium-free salad dressing. Sodium-free light mayonnaise. Fresh or refrigerated horseradish. Lemon juice. Vinegar. Homemade, reduced-sodium, or low-sodium soups. Unsalted popcorn and  pretzels. Low-salt or salt-free chips. What foods are not recommended? The items listed may not be a complete list. Talk with your dietitian about what dietary choices are best for you. Grains Instant hot cereals. Bread stuffing, pancake, and biscuit mixes. Croutons. Seasoned rice or pasta mixes. Noodle soup cups. Boxed or frozen macaroni and cheese. Regular salted crackers. Self-rising flour. Vegetables Sauerkraut, pickled vegetables, and relishes. Olives. [place] fries. Onion rings. Regular canned vegetables (not low-sodium or reduced-sodium). Regular canned tomato sauce and paste (not low-sodium or reduced-sodium). Regular tomato and vegetable juice (not low-sodium or reduced-sodium). Frozen vegetables in sauces. Meats and other protein foods Meat or fish that is salted, canned, smoked, spiced, or pickled. Bacon, ham, sausage, hotdogs, corned beef, chipped beef, packaged lunch meats, salt pork, jerky, pickled herring, anchovies, regular canned tuna, sardines, salted nuts. Dairy Processed cheese and cheese spreads. Cheese curds. Blue cheese. Feta cheese. String cheese. Regular cottage cheese. Buttermilk. Canned milk. Fats and oils Salted butter. Regular margarine. Ghee. Bacon fat. Seasonings and other foods Onion salt, garlic salt, seasoned salt, table salt, and sea salt. Canned and packaged gravies. Worcestershire sauce. Tartar sauce. Barbecue sauce. Teriyaki sauce. Soy sauce, including reduced-sodium. Steak sauce. Fish sauce. Oyster sauce. Cocktail sauce. Horseradish that you find on the shelf. Regular ketchup and mustard. Meat flavorings and tenderizers. Bouillon cubes. Hot sauce and Tabasco sauce. Premade or packaged marinades. Premade or packaged taco seasonings. Relishes. Regular salad dressings. Salsa. Potato and tortilla chips. Corn chips and puffs. Salted popcorn and pretzels. Canned or dried soups. Pizza. Frozen entrees and pot pies. Summary  Eating less sodium can help lower your blood  pressure, reduce swelling, and protect your heart, liver, and kidneys.  Most people on this plan should limit their sodium intake to 1,500-2,000 mg (milligrams) of sodium each day.  Canned, boxed, and frozen foods are high in sodium. Restaurant foods, fast foods, and pizza are also very high in sodium. You also get sodium by adding salt to food.  Try to cook at home, eat more fresh fruits and vegetables, and eat less fast food, canned, processed, or prepared foods. This information is not intended to replace advice given to you by your health care provider. Make sure you discuss any questions you have with your health care provider. Document Released: 01/26/2002 Document Revised: 07/30/2016 Document Reviewed: 07/30/2016 Elsevier Interactive Patient Education  2017 [employer].

## 2017-07-22 NOTE — Progress Notes (Signed)
Cardiology Office Note    Date:  07/22/2017   ID:  Dylan [name], DOB 08/11/1926, MRN [id number]  PCP:  [name], MD  Cardiologist: Dr. Fransico Him Chief Complaint  Patient presents with  . Shortness of Breath    History of Present Illness:  Dylan [name] is a 83 y.o. male history of hypertension, DM type II, dyslipidemia who Dr. Radford Pax saw on 2016 for dyspnea on exertion.  2D echo in 2016 showed normal LV function with grade 1 DD and Lexiscan Myoview was normal.Remote history of cath by Dr. Doreatha Lew was normal.  Patient was recently discharged from the hospital 07/15/17 after admission with CHF and new atrial fibrillation.  He diuresed down 6 pounds with IV Lasix.  Repeat 2D echo LVEF 50-55%.  He was started on lisinopril and Lasix.  Chadsvasc equals 4 so he was started on Eliquis.  Patient comes in today accompanied by his wife.  He has been weighing himself daily and weights have been stable at 265-267.  He stopped using the saltshaker.  He works out at [employer] 4 days a week.  He still has dyspnea on exertion and some leg edema.  Prior to hospitalization he says whenever he walked his heart would race and go fast and it would take him a while to ease his breathing.  He denies any chest pain, dizziness or presyncope.  Past Medical History:  Diagnosis Date  . Hyperglycemia   . Hyperlipidemia   . Hypertension   . Lung nodule     Past Surgical History:  Procedure Laterality Date  . APPENDECTOMY  1956  . BACK SURGERY  2008  . CARDIAC CATHETERIZATION  1995   negative results  . COLON SURGERY  07/09/12  . [place]  . HOT HEMOSTASIS  01/08/2012   Procedure: HOT HEMOSTASIS (ARGON PLASMA COAGULATION/BICAP);  Surgeon: Arta Silence, MD;  Location: [facility];  Service: Endoscopy;  Laterality: N/A;  . LAPAROSCOPIC ILEOCECECTOMY  07/09/2012   Procedure: LAPAROSCOPIC ILEOCECECTOMY;  Surgeon: Edward Jolly, MD;  Location: [facility];  Service: General;  Laterality:  N/A;  Laparoscopic Ileocecectomy  . REPLACEMENT TOTAL KNEE  2010    Current Medications: Current Meds  Medication Sig  . apixaban (ELIQUIS) 5 MG TABS tablet Take 1 tablet (5 mg total) by mouth 2 (two) times daily.  . carvedilol (COREG) 6.25 MG tablet Take 6.25 mg by mouth daily.   . cholecalciferol (VITAMIN D) 1000 UNITS tablet Take 2,000 Units by mouth daily.   . furosemide (LASIX) 20 MG tablet Take 1 tablet (20 mg total) by mouth daily.  Marland Kitchen gabapentin (NEURONTIN) 100 MG capsule Take 100 mg by mouth 2 (two) times daily.  Marland Kitchen lisinopril (PRINIVIL,ZESTRIL) 10 MG tablet Take 1 tablet (10 mg total) by mouth daily.  . metFORMIN (GLUCOPHAGE-XR) 500 MG 24 hr tablet Take 500 mg by mouth daily with breakfast.  . Omega-3 Fatty Acids (FISH OIL) 1200 MG CAPS Take 1 capsule by mouth 2 (two) times daily.  . simvastatin (ZOCOR) 40 MG tablet Take 40 mg by mouth daily at 6 PM.   . terazosin (HYTRIN) 2 MG capsule Take 2 mg by mouth at bedtime.  . vitamin B-12 (CYANOCOBALAMIN) 1000 MCG tablet Take 1,000 mcg by mouth daily.     Allergies:   Keflex [cephalexin]   Social History   Socioeconomic History  . Marital status: Married    Spouse name: None  . Number of children: None  . Years of education:  None  . Highest education level: None  Social Needs  . Financial resource strain: None  . Food insecurity - worry: None  . Food insecurity - inability: None  . Transportation needs - medical: None  . Transportation needs - non-medical: None  Occupational History  . None  Tobacco Use  . Smoking status: Former Smoker    Packs/day: 1.00    Types: Cigarettes    Last attempt to quit: 01/07/1992    Years since quitting: 25.5  . Smokeless tobacco: Never Used  Substance and Sexual Activity  . Alcohol use: Yes    Alcohol/week: 1.2 oz    Types: 2 Glasses of wine per week  . Drug use: No  . Sexual activity: Yes  Other Topics Concern  . None  Social History Narrative  . None     Family History:  The  patient's family history includes CVA in his father; Cancer - Other in his mother; Heart attack in his brother and maternal grandmother.   ROS:   Please see the history of present illness.    Review of Systems  Constitution: Negative.  HENT: Negative.   Cardiovascular: Positive for dyspnea on exertion, irregular heartbeat and leg swelling.  Respiratory: Negative.   Endocrine: Negative.   Hematologic/Lymphatic: Negative.   Musculoskeletal: Negative.   Gastrointestinal: Negative.   Genitourinary: Negative.   Neurological: Negative.    All other systems reviewed and are negative.   PHYSICAL EXAM:   VS:  BP 116/76   Pulse 62   Ht 6' (1.829 m)   Wt 265 lb 12.8 oz (120.6 kg)   SpO2 97%   BMI 36.05 kg/m   Physical Exam  GEN: Well nourished, well developed, in no acute distress  Neck: no JVD, carotid bruits, or masses Cardiac:RRR; 2/6 systolic murmur at the left sternal border Respiratory: Decreased but clear to auscultation bilaterally, normal work of breathing GI: soft, nontender, nondistended, + BS Ext: 2+ edema bilaterally otherwise without cyanosis, clubbing,  Good distal pulses bilaterally Neuro:  Alert and Oriented x 3,  Psych: euthymic mood, full affect  Wt Readings from Last 3 Encounters:  07/22/17 265 lb 12.8 oz (120.6 kg)  07/15/17 262 lb 4.8 oz (119 kg)  01/16/17 240 lb (108.9 kg)      Studies/Labs Reviewed:   EKG:  EKG is not ordered today.  The ekg Reviewed from the hospital 07/14/17 atrial fibrillation with controlled rate reviewed from 07/14/17 showed atrial fibrillation rate controlled, EKG from 01/17/17 showed normal sinus rhythm with frequent PACs Recent Labs: 07/14/2017: B Natriuretic Peptide 341.8 07/15/2017: BUN 12; Creatinine, Ser 1.08; Hemoglobin 12.3; Platelets 121; Potassium 4.4; Sodium 139   Lipid Panel No results found for: CHOL, TRIG, HDL, CHOLHDL, VLDL, LDLCALC, LDLDIRECT  Additional studies/ records that were reviewed today include:  2D echo  07/14/17------------------------------------------------------------------- Study Conclusions   - Left ventricle: The cavity size was normal. There was   mild-moderate concentric hypertrophy. Systolic function was   normal. The estimated ejection fraction was in the range of 50%   to 55%. Wall motion was normal; there were no regional wall   motion abnormalities. - Aortic valve: Transvalvular velocity was within the normal range.   There was no stenosis. There was mild regurgitation. - Mitral valve: Transvalvular velocity was within the normal range.   There was no evidence for stenosis. There was mild regurgitation. - Left atrium: The atrium was mildly dilated. - Right ventricle: The cavity size was normal. Wall thickness was   normal. Systolic  function was normal. - Atrial septum: No defect or patent foramen ovale was identified   by color flow Doppler. - Tricuspid valve: There was mild regurgitation. - Pulmonary arteries: Systolic pressure was mildly increased. PA   peak pressure: 45 mm Hg (S).       ASSESSMENT:    No diagnosis found.   PLAN:  In order of problems listed above:  Atrial fibrillation fairly new diagnosis for patient documented on EKG 06/2017 and started on Eliquis for chads vas score of 4.  He continues to have dyspnea on exertion.  We walked him around the office today and his heart rate got up to 150 bpm.  Increase carvedilol to 12.5 mg twice daily.  I will see him back next week for further titration if needed.  Acute on chronic diastolic CHF recent 2D echo with normal LVEF.  Patient does have significant leg edema.  Most likely exacerbated by atrial fib.  Will increase Lasix to 40 mg daily for 3 days.  2 g sodium diet.  Follow-up with me next week.  Will check labs next week.  Lisinopril is a new medication for him.  Hypertension controlled    Medication Adjustments/Labs and Tests Ordered: Current medicines are reviewed at length with the patient today.   Concerns regarding medicines are outlined above.  Medication changes, Labs and Tests ordered today are listed in the Patient Instructions below. There are no Patient Instructions on file for this visit.   Sumner Boast, PA-C  07/22/2017 12:25 PM    [facility] [place], [place], [place]  [address] Phone: [phone]; Fax: [phone]

## 2017-07-23 DIAGNOSIS — I10 Essential (primary) hypertension: Secondary | ICD-10-CM | POA: Diagnosis not present

## 2017-07-23 DIAGNOSIS — R739 Hyperglycemia, unspecified: Secondary | ICD-10-CM | POA: Diagnosis not present

## 2017-07-23 DIAGNOSIS — Z09 Encounter for follow-up examination after completed treatment for conditions other than malignant neoplasm: Secondary | ICD-10-CM | POA: Diagnosis not present

## 2017-07-23 DIAGNOSIS — Z79899 Other long term (current) drug therapy: Secondary | ICD-10-CM | POA: Diagnosis not present

## 2017-07-23 DIAGNOSIS — I4891 Unspecified atrial fibrillation: Secondary | ICD-10-CM | POA: Diagnosis not present

## 2017-07-29 ENCOUNTER — Ambulatory Visit: Payer: Medicare HMO | Admitting: Physician Assistant

## 2017-07-31 DIAGNOSIS — I10 Essential (primary) hypertension: Secondary | ICD-10-CM | POA: Diagnosis not present

## 2017-07-31 DIAGNOSIS — R609 Edema, unspecified: Secondary | ICD-10-CM | POA: Diagnosis not present

## 2017-07-31 DIAGNOSIS — I480 Paroxysmal atrial fibrillation: Secondary | ICD-10-CM | POA: Diagnosis not present

## 2017-08-01 ENCOUNTER — Encounter: Payer: Self-pay | Admitting: Physician Assistant

## 2017-08-01 ENCOUNTER — Ambulatory Visit: Payer: Medicare HMO | Admitting: Physician Assistant

## 2017-08-01 VITALS — BP 100/58 | HR 98 | Ht 72.0 in | Wt 263.1 lb

## 2017-08-01 DIAGNOSIS — D2261 Melanocytic nevi of right upper limb, including shoulder: Secondary | ICD-10-CM | POA: Diagnosis not present

## 2017-08-01 DIAGNOSIS — D485 Neoplasm of uncertain behavior of skin: Secondary | ICD-10-CM | POA: Diagnosis not present

## 2017-08-01 DIAGNOSIS — I5033 Acute on chronic diastolic (congestive) heart failure: Secondary | ICD-10-CM

## 2017-08-01 DIAGNOSIS — L570 Actinic keratosis: Secondary | ICD-10-CM | POA: Diagnosis not present

## 2017-08-01 DIAGNOSIS — I481 Persistent atrial fibrillation: Secondary | ICD-10-CM | POA: Diagnosis not present

## 2017-08-01 DIAGNOSIS — D1801 Hemangioma of skin and subcutaneous tissue: Secondary | ICD-10-CM | POA: Diagnosis not present

## 2017-08-01 DIAGNOSIS — D225 Melanocytic nevi of trunk: Secondary | ICD-10-CM | POA: Diagnosis not present

## 2017-08-01 DIAGNOSIS — D2272 Melanocytic nevi of left lower limb, including hip: Secondary | ICD-10-CM | POA: Diagnosis not present

## 2017-08-01 DIAGNOSIS — L821 Other seborrheic keratosis: Secondary | ICD-10-CM | POA: Diagnosis not present

## 2017-08-01 DIAGNOSIS — Z85828 Personal history of other malignant neoplasm of skin: Secondary | ICD-10-CM | POA: Diagnosis not present

## 2017-08-01 DIAGNOSIS — L814 Other melanin hyperpigmentation: Secondary | ICD-10-CM | POA: Diagnosis not present

## 2017-08-01 DIAGNOSIS — L820 Inflamed seborrheic keratosis: Secondary | ICD-10-CM | POA: Diagnosis not present

## 2017-08-01 DIAGNOSIS — I10 Essential (primary) hypertension: Secondary | ICD-10-CM | POA: Diagnosis not present

## 2017-08-01 DIAGNOSIS — C44519 Basal cell carcinoma of skin of other part of trunk: Secondary | ICD-10-CM | POA: Diagnosis not present

## 2017-08-01 DIAGNOSIS — I4819 Other persistent atrial fibrillation: Secondary | ICD-10-CM

## 2017-08-01 DIAGNOSIS — D2271 Melanocytic nevi of right lower limb, including hip: Secondary | ICD-10-CM | POA: Diagnosis not present

## 2017-08-01 MED ORDER — APIXABAN 5 MG PO TABS
5.0000 mg | ORAL_TABLET | Freq: Two times a day (BID) | ORAL | 0 refills | Status: DC
Start: 2017-08-01 — End: 2017-08-06

## 2017-08-01 MED ORDER — TERAZOSIN HCL 1 MG PO CAPS: 1.0000 mg | ORAL_CAPSULE | Freq: Every day | ORAL | 3 refills | Status: DC

## 2017-08-01 MED ORDER — FUROSEMIDE 40 MG PO TABS: 40.0000 mg | ORAL_TABLET | Freq: Every day | ORAL | 3 refills | Status: DC

## 2017-08-01 NOTE — Patient Instructions (Signed)
Medication Instructions:  Your physician has recommended you make the following change in your medication:  1.  DECREASE the Terazosin to 1 mg taking 1 daily 2.  INCREASE the Lasix to 40 mg daily    Labwork: TODAY:  BMET & PRO BNP   Testing/Procedures: None ordered  Follow-Up: Your physician recommends that you schedule a follow-up appointment in: IN January [place] OR Ermalinda Barrios, PA-C   Any Other Special Instructions Will Be Listed Below (If Applicable).     If you need a refill on your cardiac medications before your next appointment, please call your pharmacy.

## 2017-08-01 NOTE — Progress Notes (Signed)
Cardiology Office Note    Date:  08/01/2017   ID:  Dylan [name], DOB [birth year], MRN [id number]  PCP:  [name], MD  Cardiologist: Dr. Fransico Him  Chief Complaint  Patient presents with  . Follow-up    History of Present Illness:  Dylan [name] is a 83 y.o. male with history of hypertension, type 2 diabetes mellitus, and dyslipidemia.  History of remote cardiac catheterization by Dr. Dennison Bulla that was normal.  Patient was discharged from the hospital 07/15/17 after admission with CHF and new onset atrial fibrillation.  Repeat 2D echo LVEF 50-55% and he was started on lisinopril and Lasix.  He was also started on Eliquis for chads vas score of 4.  I saw him on 07/22/17 and his weights have been stable at 265-267 pounds.  He still had significant leg edema and heart racing.  We walked him around the office and his heart rate got up to 150 bpm.  I increase his carvedilol to 12.5 mg twice daily and increase Lasix to 40 mg daily for 3 days.  Patient comes in accompanied by his wife.  He says he is feeling a lot better.  He still has some swelling.  He is cut back on the salt in his diet.  They have track of his blood pressures and pulse and all have been stable.  He had trouble walking in here from the parking deck.  He does have some dyspnea on exertion but mostly his hips hurt.  Past Medical History:  Diagnosis Date  . Hyperglycemia   . Hyperlipidemia   . Hypertension   . Lung nodule     Past Surgical History:  Procedure Laterality Date  . APPENDECTOMY  1956  . BACK SURGERY  2008  . CARDIAC CATHETERIZATION  1995   negative results  . COLON SURGERY  07/09/12  . [place]  . HOT HEMOSTASIS  01/08/2012   Procedure: HOT HEMOSTASIS (ARGON PLASMA COAGULATION/BICAP);  Surgeon: Arta Silence, MD;  Location: [facility];  Service: Endoscopy;  Laterality: N/A;  . LAPAROSCOPIC ILEOCECECTOMY  07/09/2012   Procedure: LAPAROSCOPIC ILEOCECECTOMY;  Surgeon: Edward Jolly,  MD;  Location: [facility];  Service: General;  Laterality: N/A;  Laparoscopic Ileocecectomy  . REPLACEMENT TOTAL KNEE  2010    Current Medications: Current Meds  Medication Sig  . apixaban (ELIQUIS) 5 MG TABS tablet Take 1 tablet (5 mg total) by mouth 2 (two) times daily.  . carvedilol (COREG) 12.5 MG tablet Take 1 tablet (12.5 mg total) by mouth 2 (two) times daily with a meal.  . cholecalciferol (VITAMIN D) 1000 UNITS tablet Take 2,000 Units by mouth daily.   . furosemide (LASIX) 20 MG tablet Take 1 tablet (20 mg total) by mouth daily.  Marland Kitchen gabapentin (NEURONTIN) 100 MG capsule Take 100 mg by mouth 2 (two) times daily.  Marland Kitchen lisinopril (PRINIVIL,ZESTRIL) 10 MG tablet Take 1 tablet (10 mg total) by mouth daily.  . metFORMIN (GLUCOPHAGE-XR) 500 MG 24 hr tablet Take 500 mg by mouth daily with breakfast.  . Omega-3 Fatty Acids (FISH OIL) 1200 MG CAPS Take 1 capsule by mouth 2 (two) times daily.  . simvastatin (ZOCOR) 40 MG tablet Take 40 mg by mouth daily at 6 PM.   . terazosin (HYTRIN) 2 MG capsule Take 2 mg by mouth at bedtime.  . vitamin B-12 (CYANOCOBALAMIN) 1000 MCG tablet Take 1,000 mcg by mouth daily.     Allergies:   Keflex [cephalexin]   Social  History   Socioeconomic History  . Marital status: Married    Spouse name: None  . Number of children: None  . Years of education: None  . Highest education level: None  Social Needs  . Financial resource strain: None  . Food insecurity - worry: None  . Food insecurity - inability: None  . Transportation needs - medical: None  . Transportation needs - non-medical: None  Occupational History  . None  Tobacco Use  . Smoking status: Former Smoker    Packs/day: 1.00    Types: Cigarettes    Last attempt to quit: 01/07/1992    Years since quitting: 25.5  . Smokeless tobacco: Never Used  Substance and Sexual Activity  . Alcohol use: Yes    Alcohol/week: 1.2 oz    Types: 2 Glasses of wine per week  . Drug use: No  . Sexual activity: Yes    Other Topics Concern  . None  Social History Narrative  . None     Family History:  The patient's family history includes CVA in his father; Cancer - Other in his mother; Heart attack in his brother and maternal grandmother.   ROS:   Please see the history of present illness.    Review of Systems  Constitution: Negative.  HENT: Negative.   Cardiovascular: Positive for dyspnea on exertion.  Respiratory: Negative.   Endocrine: Negative.   Hematologic/Lymphatic: Negative.   Musculoskeletal: Positive for arthritis and joint pain.  Gastrointestinal: Negative.   Genitourinary: Negative.   Neurological: Negative.    All other systems reviewed and are negative.   PHYSICAL EXAM:   VS:  Ht 6' (1.829 m)   Wt 263 lb 1.9 oz (119.4 kg)   BMI 35.69 kg/m   Physical Exam  GEN: Well nourished, well developed, in no acute distress  Neck: Slight increase JVD, carotid bruits, or masses Cardiac: Irregularly irregular; no murmurs, rubs, or gallops  Respiratory:  clear to auscultation bilaterally, normal work of breathing GI: soft, nontender, nondistended, + BS Ext: +1 to do edema, decreased distal pulses bilaterally Neuro:  Alert and Oriented x 3 Psych: euthymic mood, full affect  Wt Readings from Last 3 Encounters:  08/01/17 263 lb 1.9 oz (119.4 kg)  07/22/17 265 lb 12.8 oz (120.6 kg)  07/15/17 262 lb 4.8 oz (119 kg)      Studies/Labs Reviewed:   EKG:  EKG is  ordered today.  The ekg ordered today demonstrates atrial fibrillation at 97 bpm  Recent Labs: 07/14/2017: B Natriuretic Peptide 341.8 07/15/2017: BUN 12; Creatinine, Ser 1.08; Hemoglobin 12.3; Platelets 121; Potassium 4.4; Sodium 139   Lipid Panel No results found for: CHOL, TRIG, HDL, CHOLHDL, VLDL, LDLCALC, LDLDIRECT  Additional studies/ records that were reviewed today include:   2D echo 07/14/17------------------------------------------------------------------- Study Conclusions   - Left ventricle: The cavity size  was normal. There was   mild-moderate concentric hypertrophy. Systolic function was   normal. The estimated ejection fraction was in the range of 50%   to 55%. Wall motion was normal; there were no regional wall   motion abnormalities. - Aortic valve: Transvalvular velocity was within the normal range.   There was no stenosis. There was mild regurgitation. - Mitral valve: Transvalvular velocity was within the normal range.   There was no evidence for stenosis. There was mild regurgitation. - Left atrium: The atrium was mildly dilated. - Right ventricle: The cavity size was normal. Wall thickness was   normal. Systolic function was normal. - Atrial septum:  No defect or patent foramen ovale was identified   by color flow Doppler. - Tricuspid valve: There was mild regurgitation. - Pulmonary arteries: Systolic pressure was mildly increased. PA   peak pressure: 45 mm Hg (S).            ASSESSMENT:    1. Persistent atrial fibrillation ([place])   2. Diastolic dysfunction with acute on chronic heart failure ([place])   3. Benign essential HTN      PLAN:  In order of problems listed above:  Atrial fibrillation newly documented on EKG 06/2017 and started on Eliquis for chads Vasc=4 carvedilol increased last office visit to 12.5 mg twice daily for better heart rate control.  We walked the patient around the office today and his heart rate stayed at 66.  We will keep carvedilol at 12.5 mg twice daily.  Chronic diastolic CHF with normal LVEF on 2D echo 06/2017.  Patient still has some leg edema and dyspnea on exertion.  Will increase Lasix to 40 mg daily.  Check BNP and be met today.  I will see him back in January and follow-up with Dr. Radford Pax in February.  Essential hypertension patient's blood pressure is on the low side.  We will decrease Hytrin to 1 mg nightly.  He thinks he is on this for blood pressure and not prostate.  Medication Adjustments/Labs and Tests Ordered: Current medicines  are reviewed at length with the patient today.  Concerns regarding medicines are outlined above.  Medication changes, Labs and Tests ordered today are listed in the Patient Instructions below. There are no Patient Instructions on file for this visit.   Signed, Ermalinda Barrios, PA-C  08/01/2017 2:20 PM    [facility] [place], [place], [place]  [address] Phone: [phone]; Fax: [phone]

## 2017-08-02 ENCOUNTER — Telehealth: Payer: Self-pay | Admitting: *Deleted

## 2017-08-02 ENCOUNTER — Encounter: Payer: Self-pay | Admitting: Physician Assistant

## 2017-08-02 LAB — BASIC METABOLIC PANEL
BUN/Creatinine Ratio: 16 (ref 10–24)
BUN: 20 mg/dL (ref 8–27)
CALCIUM: 9.4 mg/dL (ref 8.6–10.2)
CHLORIDE: 101 mmol/L (ref 96–106)
CO2: 24 mmol/L (ref 20–29)
Creatinine, Ser: 1.24 mg/dL (ref 0.76–1.27)
GFR calc non Af Amer: 51 mL/min/{1.73_m2} — ABNORMAL LOW (ref >59)
GFR, EST AFRICAN AMERICAN: 59 mL/min/{1.73_m2} — AB (ref >59)
Glucose: 106 mg/dL — ABNORMAL HIGH (ref 65–99)
POTASSIUM: 4.6 mmol/L (ref 3.5–5.2)
SODIUM: 139 mmol/L (ref 134–144)

## 2017-08-02 LAB — PRO B NATRIURETIC PEPTIDE: NT-Pro BNP: 1490 pg/mL — ABNORMAL HIGH (ref 0–486)

## 2017-08-02 MED ORDER — FUROSEMIDE 40 MG PO TABS: ORAL_TABLET | ORAL | 3 refills | Status: DC

## 2017-08-02 NOTE — Telephone Encounter (Signed)
-----   Message from Imogene Burn, PA-C sent at 08/02/2017 11:53 AM EST ----- Heart failure marker high. Please increase Lasix 60 mg for 3 days then 40 mg daily. Please adhere to a 2 gm sodium diet. bmet in 2 weeks. Please call next week if no improvement in edema or breathing

## 2017-08-03 ENCOUNTER — Emergency Department (HOSPITAL_COMMUNITY): Payer: Medicare HMO

## 2017-08-03 ENCOUNTER — Inpatient Hospital Stay (HOSPITAL_COMMUNITY)
Admission: EM | Admit: 2017-08-03 | Discharge: 2017-08-06 | DRG: 308 | Disposition: A | Discharge status: Home Health | Payer: Medicare HMO | Attending: Cardiology | Admitting: Cardiology

## 2017-08-03 ENCOUNTER — Other Ambulatory Visit: Payer: Self-pay

## 2017-08-03 ENCOUNTER — Encounter (HOSPITAL_COMMUNITY): Payer: Self-pay

## 2017-08-03 DIAGNOSIS — Z6833 Body mass index (BMI) 33.0-33.9, adult: Secondary | ICD-10-CM | POA: Diagnosis not present

## 2017-08-03 DIAGNOSIS — I5033 Acute on chronic diastolic (congestive) heart failure: Secondary | ICD-10-CM | POA: Diagnosis present

## 2017-08-03 DIAGNOSIS — I5031 Acute diastolic (congestive) heart failure: Secondary | ICD-10-CM | POA: Diagnosis not present

## 2017-08-03 DIAGNOSIS — Z96659 Presence of unspecified artificial knee joint: Secondary | ICD-10-CM | POA: Diagnosis present

## 2017-08-03 DIAGNOSIS — I5089 Other heart failure: Secondary | ICD-10-CM | POA: Diagnosis not present

## 2017-08-03 DIAGNOSIS — R0600 Dyspnea, unspecified: Secondary | ICD-10-CM | POA: Diagnosis not present

## 2017-08-03 DIAGNOSIS — I481 Persistent atrial fibrillation: Secondary | ICD-10-CM | POA: Diagnosis not present

## 2017-08-03 DIAGNOSIS — I4891 Unspecified atrial fibrillation: Secondary | ICD-10-CM | POA: Diagnosis not present

## 2017-08-03 DIAGNOSIS — N183 Chronic kidney disease, stage 3 (moderate): Secondary | ICD-10-CM | POA: Diagnosis present

## 2017-08-03 DIAGNOSIS — Z888 Allergy status to other drugs, medicaments and biological substances status: Secondary | ICD-10-CM

## 2017-08-03 DIAGNOSIS — Z79899 Other long term (current) drug therapy: Secondary | ICD-10-CM | POA: Diagnosis not present

## 2017-08-03 DIAGNOSIS — Z87891 Personal history of nicotine dependence: Secondary | ICD-10-CM

## 2017-08-03 DIAGNOSIS — Z7902 Long term (current) use of antithrombotics/antiplatelets: Secondary | ICD-10-CM

## 2017-08-03 DIAGNOSIS — Z7984 Long term (current) use of oral hypoglycemic drugs: Secondary | ICD-10-CM

## 2017-08-03 DIAGNOSIS — I110 Hypertensive heart disease with heart failure: Secondary | ICD-10-CM | POA: Diagnosis not present

## 2017-08-03 DIAGNOSIS — I509 Heart failure, unspecified: Secondary | ICD-10-CM

## 2017-08-03 DIAGNOSIS — E1122 Type 2 diabetes mellitus with diabetic chronic kidney disease: Secondary | ICD-10-CM | POA: Diagnosis present

## 2017-08-03 DIAGNOSIS — I480 Paroxysmal atrial fibrillation: Principal | ICD-10-CM | POA: Diagnosis present

## 2017-08-03 DIAGNOSIS — E785 Hyperlipidemia, unspecified: Secondary | ICD-10-CM | POA: Diagnosis not present

## 2017-08-03 DIAGNOSIS — I340 Nonrheumatic mitral (valve) insufficiency: Secondary | ICD-10-CM | POA: Diagnosis not present

## 2017-08-03 DIAGNOSIS — I130 Hypertensive heart and chronic kidney disease with heart failure and stage 1 through stage 4 chronic kidney disease, or unspecified chronic kidney disease: Secondary | ICD-10-CM | POA: Diagnosis present

## 2017-08-03 DIAGNOSIS — E119 Type 2 diabetes mellitus without complications: Secondary | ICD-10-CM | POA: Diagnosis not present

## 2017-08-03 DIAGNOSIS — R4020 Unspecified coma: Secondary | ICD-10-CM | POA: Diagnosis not present

## 2017-08-03 DIAGNOSIS — R0602 Shortness of breath: Secondary | ICD-10-CM | POA: Diagnosis not present

## 2017-08-03 LAB — TROPONIN I: Troponin I: <0.03 ng/mL (ref <0.03)

## 2017-08-03 LAB — GLUCOSE, CAPILLARY
Glucose-Capillary: 96 mg/dL (ref 65–99)
Glucose-Capillary: 111 mg/dL — ABNORMAL HIGH (ref 65–99)

## 2017-08-03 LAB — COMPREHENSIVE METABOLIC PANEL
ALBUMIN: 3.9 g/dL (ref 3.5–5.0)
ALK PHOS: 61 U/L (ref 38–126)
ALT: 17 U/L (ref 17–63)
AST: 22 U/L (ref 15–41)
Anion gap: 8 (ref 5–15)
BUN: 22 mg/dL — AB (ref 6–20)
CALCIUM: 9.1 mg/dL (ref 8.9–10.3)
CO2: 26 mmol/L (ref 22–32)
CREATININE: 1.42 mg/dL — AB (ref 0.61–1.24)
Chloride: 101 mmol/L (ref 101–111)
GFR calc non Af Amer: 42 mL/min — ABNORMAL LOW (ref >60)
GFR, EST AFRICAN AMERICAN: 49 mL/min — AB (ref >60)
GLUCOSE: 78 mg/dL (ref 65–99)
Potassium: 4 mmol/L (ref 3.5–5.1)
SODIUM: 135 mmol/L (ref 135–145)
Total Bilirubin: 2.1 mg/dL — ABNORMAL HIGH (ref 0.3–1.2)
Total Protein: 6.5 g/dL (ref 6.5–8.1)

## 2017-08-03 LAB — CBC
HCT: 40 % (ref 39.0–52.0)
HEMOGLOBIN: 13.3 g/dL (ref 13.0–17.0)
MCH: 31.6 pg (ref 26.0–34.0)
MCHC: 33.3 g/dL (ref 30.0–36.0)
MCV: 95 fL (ref 78.0–100.0)
Platelets: 156 10*3/uL (ref 150–400)
RBC: 4.21 MIL/uL — AB (ref 4.22–5.81)
RDW: 13.2 % (ref 11.5–15.5)
WBC: 6.7 10*3/uL (ref 4.0–10.5)

## 2017-08-03 LAB — BRAIN NATRIURETIC PEPTIDE: B Natriuretic Peptide: 318.9 pg/mL — ABNORMAL HIGH (ref 0.0–100.0)

## 2017-08-03 LAB — I-STAT TROPONIN, ED
TROPONIN I, POC: 0 ng/mL (ref 0.00–0.08)
Troponin i, poc: 0 ng/mL (ref 0.00–0.08)

## 2017-08-03 MED ORDER — SIMVASTATIN 40 MG PO TABS
40.0000 mg | ORAL_TABLET | Freq: Every day | ORAL | Status: DC
  Administered 2017-08-03 – 2017-08-05 (×3): 40 mg via ORAL
  Filled 2017-08-03 (×3): qty 1

## 2017-08-03 MED ORDER — APIXABAN 5 MG PO TABS
5.0000 mg | ORAL_TABLET | Freq: Two times a day (BID) | ORAL | Status: DC
  Administered 2017-08-03 – 2017-08-05 (×4): 5 mg via ORAL
  Filled 2017-08-03 (×4): qty 1

## 2017-08-03 MED ORDER — GABAPENTIN 600 MG PO TABS
300.0000 mg | ORAL_TABLET | Freq: Two times a day (BID) | ORAL | Status: DC
  Administered 2017-08-03 – 2017-08-06 (×6): 300 mg via ORAL
  Filled 2017-08-03 (×6): qty 1

## 2017-08-03 MED ORDER — ONDANSETRON HCL 4 MG/2ML IJ SOLN: 4.0000 mg | Freq: Four times a day (QID) | INTRAMUSCULAR | Status: DC | PRN

## 2017-08-03 MED ORDER — INSULIN ASPART 100 UNIT/ML ~~LOC~~ SOLN
0.0000 [IU] | Freq: Three times a day (TID) | SUBCUTANEOUS | Status: DC
  Administered 2017-08-05: 2 [IU] via SUBCUTANEOUS

## 2017-08-03 MED ORDER — SODIUM CHLORIDE 0.9% FLUSH: 3.0000 mL | INTRAVENOUS | Status: DC | PRN

## 2017-08-03 MED ORDER — CARVEDILOL 12.5 MG PO TABS
12.5000 mg | ORAL_TABLET | Freq: Two times a day (BID) | ORAL | Status: DC
  Administered 2017-08-03 – 2017-08-04 (×2): 12.5 mg via ORAL
  Filled 2017-08-03 (×2): qty 1

## 2017-08-03 MED ORDER — POTASSIUM CHLORIDE CRYS ER 20 MEQ PO TBCR
40.0000 meq | EXTENDED_RELEASE_TABLET | Freq: Two times a day (BID) | ORAL | Status: DC
  Administered 2017-08-03 – 2017-08-05 (×5): 40 meq via ORAL
  Filled 2017-08-03 (×6): qty 2

## 2017-08-03 MED ORDER — SODIUM CHLORIDE 0.9 % IV SOLN: 250.0000 mL | INTRAVENOUS | Status: DC | PRN

## 2017-08-03 MED ORDER — SODIUM CHLORIDE 0.9% FLUSH
3.0000 mL | Freq: Two times a day (BID) | INTRAVENOUS | Status: DC
  Administered 2017-08-03 – 2017-08-05 (×5): 3 mL via INTRAVENOUS

## 2017-08-03 MED ORDER — FUROSEMIDE 10 MG/ML IJ SOLN
40.0000 mg | Freq: Two times a day (BID) | INTRAMUSCULAR | Status: DC
  Administered 2017-08-03 – 2017-08-05 (×4): 40 mg via INTRAVENOUS
  Filled 2017-08-03 (×4): qty 4

## 2017-08-03 NOTE — ED Triage Notes (Signed)
Pt arrived from families home r/t family feeling that pt needed to be seen. Pt had driven himself to his sons home and was c/o SOB and dizziness to family. Upon EMS arrival pt room air sat was 78%, pt does not wear O2 at home. New diagnosis of CHF. Pt rpeorts dizziness resolved once placed on [place]. 2L [place] 96%

## 2017-08-03 NOTE — ED Notes (Signed)
ED Provider at bedside. 

## 2017-08-03 NOTE — ED Provider Notes (Signed)
[facility] EMERGENCY DEPARTMENT Provider Note   CSN: [id number] Arrival date & time: 08/03/17  1135     History   Chief Complaint Chief Complaint  Patient presents with  . Shortness of Breath    HPI Dylan [name] is a 83 y.o. male.  HPI Patient presents to the emergency room for evaluation after an episode of shortness of breath and unsteadiness.  Patient went to go visit his family members.  He drove from his home to their's.  This was about a [address].  Patient's family member who works in the heart failure clinic here noticed that when he first walked in he seemed to be having difficulty speaking.  He seemed to be having trouble catching his breath.  He seemed to be rather pale.  Patient sat down and they called EMS.  His symptoms seem to improve slowly.  When EMS arrived they documented oxygen saturation of 78%.  Patient does have a history of CHF and he has had his medications increased over this past week.  Patient himself denies any trouble with chest pain.  He denies specifically feeling short of breath but he seems to minimize his symptoms. Past Medical History:  Diagnosis Date  . Hyperglycemia   . Hyperlipidemia   . Hypertension   . Lung nodule     Patient Active Problem List   Diagnosis Date Noted  . Atrial fibrillation ([place]) 07/14/2017  . Diastolic dysfunction with acute on chronic heart failure ([place]) 07/14/2017  . DOE (dyspnea on exertion) 08/24/2014  . Benign essential HTN 08/24/2014  . DM II (diabetes mellitus, type II), controlled ([place]) 08/24/2014  . Dyslipidemia 08/24/2014    Past Surgical History:  Procedure Laterality Date  . APPENDECTOMY  1956  . BACK SURGERY  2008  . CARDIAC CATHETERIZATION  1995   negative results  . COLON SURGERY  07/09/12  . [place]  . HOT HEMOSTASIS  01/08/2012   Procedure: HOT HEMOSTASIS (ARGON PLASMA COAGULATION/BICAP);  Surgeon: Arta Silence, MD;  Location: [facility];  Service:  Endoscopy;  Laterality: N/A;  . LAPAROSCOPIC ILEOCECECTOMY  07/09/2012   Procedure: LAPAROSCOPIC ILEOCECECTOMY;  Surgeon: Edward Jolly, MD;  Location: [facility];  Service: General;  Laterality: N/A;  Laparoscopic Ileocecectomy  . REPLACEMENT TOTAL KNEE  2010       Home Medications    Prior to Admission medications   Medication Sig Start Date End Date Taking? Authorizing Provider  apixaban (ELIQUIS) 5 MG TABS tablet Take 1 tablet (5 mg total) by mouth 2 (two) times daily. 08/01/17   Imogene Burn, PA-C  carvedilol (COREG) 12.5 MG tablet Take 1 tablet (12.5 mg total) by mouth 2 (two) times daily with a meal. 07/22/17   Imogene Burn, PA-C  cholecalciferol (VITAMIN D) 1000 UNITS tablet Take 2,000 Units by mouth daily.     [provider]  furosemide (LASIX) 40 MG tablet Take 1 1/2 tablet by mouth X's 3 days then go back to 1 tablet by mouth daily 08/02/17   Imogene Burn, PA-C  gabapentin (NEURONTIN) 100 MG capsule Take 100 mg by mouth 2 (two) times daily.    [provider]  lisinopril (PRINIVIL,ZESTRIL) 10 MG tablet Take 1 tablet (10 mg total) by mouth daily. 07/16/17   Dessa Phi, DO  metFORMIN (GLUCOPHAGE-XR) 500 MG 24 hr tablet Take 500 mg by mouth daily with breakfast.    [provider]  Omega-3 Fatty Acids (FISH OIL) 1200 MG  CAPS Take 1 capsule by mouth 2 (two) times daily.    [provider]  simvastatin (ZOCOR) 40 MG tablet Take 40 mg by mouth daily at 6 PM.     [provider]  terazosin (HYTRIN) 1 MG capsule Take 1 capsule (1 mg total) by mouth at bedtime. 08/01/17   Imogene Burn, PA-C  vitamin B-12 (CYANOCOBALAMIN) 1000 MCG tablet Take 1,000 mcg by mouth daily.    [provider]    Family History Family History  Problem Relation Age of Onset  . Cancer - Other Mother   . CVA Father   . Heart attack Brother   . Heart attack Maternal Grandmother     Social History Social History   Tobacco Use  .  Smoking status: Former Smoker    Packs/day: 1.00    Types: Cigarettes    Last attempt to quit: 01/07/1992    Years since quitting: 25.5  . Smokeless tobacco: Never Used  Substance Use Topics  . Alcohol use: Yes    Alcohol/week: 1.2 oz    Types: 2 Glasses of wine per week  . Drug use: No     Allergies   Keflex [cephalexin]   Review of Systems Review of Systems  All other systems reviewed and are negative.    Physical Exam Updated Vital Signs BP 116/70   Pulse (!) 56   Temp 97.8 F (36.6 C) (Oral)   Resp 15   Ht 1.829 m (6')   Wt 119.3 kg (263 lb)   SpO2 95%   BMI 35.67 kg/m   Physical Exam  Constitutional: He appears well-developed and well-nourished. No distress.  HENT:  Head: Normocephalic and atraumatic.  Right Ear: External ear normal.  Left Ear: External ear normal.  Eyes: Conjunctivae are normal. Right eye exhibits no discharge. Left eye exhibits no discharge. No scleral icterus.  Neck: Neck supple. No tracheal deviation present.  Cardiovascular: Normal rate and intact distal pulses. An irregularly irregular rhythm present.  Pulmonary/Chest: Effort normal. No stridor. No respiratory distress. He has no wheezes. He has rales ( few crackles at bases).  Abdominal: Soft. Bowel sounds are normal. He exhibits no distension. There is no tenderness. There is no rebound and no guarding.  Musculoskeletal: He exhibits no tenderness.       Right lower leg: He exhibits edema. He exhibits no tenderness.       Left lower leg: He exhibits edema. He exhibits no tenderness.  Neurological: He is alert. He has normal strength. No cranial nerve deficit (no facial droop, extraocular movements intact, no slurred speech) or sensory deficit. He exhibits normal muscle tone. He displays no seizure activity. Coordination normal.  Skin: Skin is warm and dry. No rash noted.  Psychiatric: He has a normal mood and affect.  Nursing note and vitals reviewed.    ED Treatments / Results    Labs (all labs ordered are listed, but only abnormal results are displayed) Labs Reviewed  CBC - Abnormal; Notable for the following components:      Result Value   RBC 4.21 (*)    All other components within normal limits  COMPREHENSIVE METABOLIC PANEL - Abnormal; Notable for the following components:   BUN 22 (*)    Creatinine, Ser 1.42 (*)    Total Bilirubin 2.1 (*)    GFR calc non Af Amer 42 (*)    GFR calc Af Amer 49 (*)    All other components within normal limits  BRAIN NATRIURETIC  PEPTIDE - Abnormal; Notable for the following components:   B Natriuretic Peptide 318.9 (*)    All other components within normal limits  I-STAT TROPONIN, ED  I-STAT TROPONIN, ED    EKG  EKG Interpretation  Date/Time:  Saturday August 03 2017 11:41:00 EST Ventricular Rate:  88 PR Interval:    QRS Duration: 100 QT Interval:  393 QTC Calculation: 457 R Axis:   53 Text Interpretation:  Atrial fibrillation Ventricular premature complex No significant change since last tracing Confirmed by Dorie Rank [phone]) on 08/03/2017 11:46:15 AM       Radiology Ct Head Wo Contrast  Result Date: 08/03/2017 CLINICAL DATA:  83 year old male with altered level of consciousness, pallor. EXAM: CT HEAD WITHOUT CONTRAST TECHNIQUE: Contiguous axial images were obtained from the base of the skull through the vertex without intravenous contrast. COMPARISON:  PET-CT 01/01/2007. FINDINGS: Brain: Cerebral and cerebellar volume loss appears generalized. Mild for age cerebral white matter hypodensity. Elsewhere Gray-white matter differentiation is within normal limits throughout the brain. No midline shift, ventriculomegaly, mass effect, evidence of mass lesion, intracranial hemorrhage or evidence of cortically based acute infarction. No cortical encephalomalacia identified. Vascular: Calcified atherosclerosis at the skull base. No suspicious intracranial vascular hyperdensity. Skull: No acute osseous abnormality  identified. Sinuses/Orbits: Chronic right mastoidectomy is stable since 2008. Mild to moderate bilateral ethmoid and maxillary sinus mucosal thickening. No sinus fluid level identified. Other: No acute orbit or scalp soft tissue finding. IMPRESSION: 1. No acute intracranial abnormality. Mild for age nonspecific cerebral white matter signal changes. 2. Mild to moderate ethmoid and maxillary sinus mucosal thickening. Electronically Signed   By: Genevie Ann M.D.   On: 08/03/2017 15:05   Dg Chest Portable 1 View  Result Date: 08/03/2017 CLINICAL DATA:  Shortness of Breath EXAM: PORTABLE CHEST 1 VIEW COMPARISON:  07/14/2017 FINDINGS: The heart size and mediastinal contours are within normal limits. Both lungs are clear. The visualized skeletal structures are unremarkable. IMPRESSION: No active disease. Electronically Signed   By: Inez Catalina M.D.   On: 08/03/2017 12:33    Procedures Procedures (including critical care time)  Medications Ordered in ED Medications - No data to display   Initial Impression / Assessment and Plan / ED Course  I have reviewed the triage vital signs and the nursing notes.  Pertinent labs & imaging results that were available during my care of the patient were reviewed by me and considered in my medical decision making (see chart for details).   Pt presented to the ED for evaluation of an episode of shortness of breath and difficulty speaking.  Pt has history of chf and a fib.  Pt is feeling fine now.  Concerned about the possibility of TIA causing his difficulty speaking versus it being related to his dyspnea.  No sign of pulm edema on CXR.  Labs otherwise reassuring.   Pt seen by cardiology.  Plan on admission.  Suspect sx are related to his CHF, a fib.  Final Clinical Impressions(s) / ED Diagnoses   Final diagnoses:  Dyspnea, unspecified type  Atrial fibrillation, unspecified type [facility])      Dorie Rank, MD 08/03/17 [phone]

## 2017-08-03 NOTE — Progress Notes (Signed)
Patient with multiple areas where skin cancers have been removed recently by dermatologist.  Patient has large area to mid back that was biopsied, opsite applied.  Abrasion to left ear, opsite applied.  Scabs noted to nose x 2.  Left 4th toe with bandaid where patient pulled nail off.  Right great toe with scab (dry).

## 2017-08-03 NOTE — Plan of Care (Signed)
Vital signs remain stable, Bp 108/68 manual by RN prior to administration of IV lasix.  Patient diuresing.  Denies shortness of breath or chest pain at this time.

## 2017-08-03 NOTE — Consult Note (Signed)
    CARDIOLOGY CONSULT NOTE    Error

## 2017-08-03 NOTE — Progress Notes (Signed)
Patient admitted to [facility] from Emergency Room, VSS, BP soft.  Patients oxygen saturation is 96% on room air after walking to bed however patient feels better with oxygen on so 1 liter for comfort in use.  Wife at bedside.

## 2017-08-03 NOTE — ED Notes (Signed)
Placed pt on 2L [place].

## 2017-08-03 NOTE — H&P (Signed)
Advanced Heart Failure Team History and Physical Note   Primary Cardiologist:  [facility]: [facility])  Reason for Admission: CHF   HPI:    83 yo with history of DM and HTN as well as recent onset atrial fibrillation and CHF was referred by Dr. Tomi Bamberger today for evaluation of CHF.   Patient was doing well until late November of this year when he developed exertional dyspnea.  He went to the ER 11/25 and was found to be in atrial fibrillation with volume overload.  He was admitted overnight, diuresed and started on Eliquis, and discharged.  After discharge, he followed up in the cardiology office and Lasix has been steadily increased from 20 mg daily to 60 mg daily (has taken 60 mg daily x 2 days now).  He has remained short of breath with more than about 100 feet walking.    Today, he drove to his son's house and was noted to be markedly short of breath and panting when he walked in.  Family called EMS, per EMS initial oxygen saturation was 78% on room air but was in the 90s when he got here on room air.  He reports unsteadiness on his feet but says that this has been his baseline for a year.  No focal neurological symptoms.  No chest pain. Denies orthopnea/PND.    CT head: No acute changes.   PMH: 1. Chronic diastolic CHF: Echo (57/32) with EF 50-55%, mild-moderate LVH, mild AI, mild MR, normal RV size and systolic function, PASP 45 mmHg.  2. Atrial fibrillation: Persistent, initially diagnosed in 11/18.  3. Chest pain: LHC remotely without obstructive disease.  1/16 Cardiolite with no ischemia.  4. Type II diabetes 5. HTN  Review of Systems:  All systems reviewed and negative except as per HPI.   Home Medications Prior to Admission medications   Medication Sig Start Date End Date Taking? Authorizing Provider  apixaban (ELIQUIS) 5 MG TABS tablet Take 1 tablet (5 mg total) by mouth 2 (two) times daily. 08/01/17   Imogene Burn, PA-C  carvedilol (COREG) 12.5 MG tablet  Take 1 tablet (12.5 mg total) by mouth 2 (two) times daily with a meal. 07/22/17   Imogene Burn, PA-C  cholecalciferol (VITAMIN D) 1000 UNITS tablet Take 2,000 Units by mouth daily.     [provider]  furosemide (LASIX) 40 MG tablet Take 1 1/2 tablet by mouth X's 3 days then go back to 1 tablet by mouth daily 08/02/17   Imogene Burn, PA-C  gabapentin (NEURONTIN) 100 MG capsule Take 100 mg by mouth 2 (two) times daily.    [provider]  lisinopril (PRINIVIL,ZESTRIL) 10 MG tablet Take 1 tablet (10 mg total) by mouth daily. 07/16/17   Dessa Phi, DO  metFORMIN (GLUCOPHAGE-XR) 500 MG 24 hr tablet Take 500 mg by mouth daily with breakfast.    [provider]  Omega-3 Fatty Acids (FISH OIL) 1200 MG CAPS Take 1 capsule by mouth 2 (two) times daily.    [provider]  simvastatin (ZOCOR) 40 MG tablet Take 40 mg by mouth daily at 6 PM.     [provider]  terazosin (HYTRIN) 1 MG capsule Take 1 capsule (1 mg total) by mouth at bedtime. 08/01/17   Imogene Burn, PA-C  vitamin B-12 (CYANOCOBALAMIN) 1000 MCG tablet Take 1,000 mcg by mouth daily.    [provider]    Past Medical History: Past Medical History:  Diagnosis Date  .  Hyperglycemia   . Hyperlipidemia   . Hypertension   . Lung nodule     Past Surgical History: Past Surgical History:  Procedure Laterality Date  . APPENDECTOMY  1956  . BACK SURGERY  2008  . CARDIAC CATHETERIZATION  1995   negative results  . COLON SURGERY  07/09/12  . [place]  . HOT HEMOSTASIS  01/08/2012   Procedure: HOT HEMOSTASIS (ARGON PLASMA COAGULATION/BICAP);  Surgeon: Arta Silence, MD;  Location: [facility];  Service: Endoscopy;  Laterality: N/A;  . LAPAROSCOPIC ILEOCECECTOMY  07/09/2012   Procedure: LAPAROSCOPIC ILEOCECECTOMY;  Surgeon: Edward Jolly, MD;  Location: [facility];  Service: General;  Laterality: N/A;  Laparoscopic Ileocecectomy  . REPLACEMENT TOTAL KNEE  2010     Family History:  Family History  Problem Relation Age of Onset  . Cancer - Other Mother   . CVA Father   . Heart attack Brother   . Heart attack Maternal Grandmother     Social History: Social History   Socioeconomic History  . Marital status: Married    Spouse name: None  . Number of children: None  . Years of education: None  . Highest education level: None  Social Needs  . Financial resource strain: None  . Food insecurity - worry: None  . Food insecurity - inability: None  . Transportation needs - medical: None  . Transportation needs - non-medical: None  Occupational History  . None  Tobacco Use  . Smoking status: Former Smoker    Packs/day: 1.00    Types: Cigarettes    Last attempt to quit: 01/07/1992    Years since quitting: 25.5  . Smokeless tobacco: Never Used  Substance and Sexual Activity  . Alcohol use: Yes    Alcohol/week: 1.2 oz    Types: 2 Glasses of wine per week  . Drug use: No  . Sexual activity: Yes  Other Topics Concern  . None  Social History Narrative  . None    Allergies:  Allergies  Allergen Reactions  . Keflex [Cephalexin] Hives    1980's    Objective:    Vital Signs:   Temp:  [97.8 F (36.6 C)] 97.8 F (36.6 C) (12/15 1142) Pulse Rate:  [41-77] 56 (12/15 1345) Resp:  [14-17] 15 (12/15 1345) BP: (113-121)/(65-83) 116/70 (12/15 1345) SpO2:  [95 %-99 %] 95 % (12/15 1345) Weight:  [263 lb (119.3 kg)] 263 lb (119.3 kg) (12/15 1150)   Filed Weights   08/03/17 1150  Weight: 263 lb (119.3 kg)     Physical Exam     General:  Well appearing. No respiratory difficulty. NAD HEENT: Normal Neck: Supple. JVP 8-9 cm with HJR. Carotids 2+ bilat; no bruits. No lymphadenopathy or thyromegaly appreciated. Cor: PMI nondisplaced. Irregular rate & rhythm. No rubs, gallops or murmurs. Lungs: Clear Abdomen: Soft, nontender, nondistended. No hepatosplenomegaly. No bruits or masses. Good bowel sounds. Extremities: No cyanosis,  clubbing, rash.  1+ ankle edema.  Neuro: Alert & oriented x 3, cranial nerves grossly intact. moves all 4 extremities w/o difficulty. Affect pleasant.   Telemetry   Atrial fibrillation in 80s.(personally reviewed)  EKG   Atrial fibrillation at 89, PVC (personally reviewed)  Labs     Basic Metabolic Panel: Recent Labs  Lab 08/01/17 1517 08/03/17 1158  NA 139 135  K 4.6 4.0  CL 101 101  CO2 24 26  GLUCOSE 106* 78  BUN 20 22*  CREATININE 1.24 1.42*  CALCIUM 9.4 9.1  Liver Function Tests: Recent Labs  Lab 08/03/17 1158  AST 22  ALT 17  ALKPHOS 61  BILITOT 2.1*  PROT 6.5  ALBUMIN 3.9   No results for input(s): LIPASE, AMYLASE in the last 168 hours. No results for input(s): AMMONIA in the last 168 hours.  CBC: Recent Labs  Lab 08/03/17 1158  WBC 6.7  HGB 13.3  HCT 40.0  MCV 95.0  PLT 156    Cardiac Enzymes: No results for input(s): CKTOTAL, CKMB, CKMBINDEX, TROPONINI in the last 168 hours.  BNP: BNP (last 3 results) Recent Labs    07/14/17 1601 08/03/17 1158  BNP 341.8* 318.9*    ProBNP (last 3 results) Recent Labs    08/01/17 1517  PROBNP 1,490*     CBG: No results for input(s): GLUCAP in the last 168 hours.  Coagulation Studies: No results for input(s): LABPROT, INR in the last 72 hours.  Imaging: Ct Head Wo Contrast  Result Date: 08/03/2017 CLINICAL DATA:  83 year old male with altered level of consciousness, pallor. EXAM: CT HEAD WITHOUT CONTRAST TECHNIQUE: Contiguous axial images were obtained from the base of the skull through the vertex without intravenous contrast. COMPARISON:  PET-CT 01/01/2007. FINDINGS: Brain: Cerebral and cerebellar volume loss appears generalized. Mild for age cerebral white matter hypodensity. Elsewhere Gray-white matter differentiation is within normal limits throughout the brain. No midline shift, ventriculomegaly, mass effect, evidence of mass lesion, intracranial hemorrhage or evidence of cortically  based acute infarction. No cortical encephalomalacia identified. Vascular: Calcified atherosclerosis at the skull base. No suspicious intracranial vascular hyperdensity. Skull: No acute osseous abnormality identified. Sinuses/Orbits: Chronic right mastoidectomy is stable since 2008. Mild to moderate bilateral ethmoid and maxillary sinus mucosal thickening. No sinus fluid level identified. Other: No acute orbit or scalp soft tissue finding. IMPRESSION: 1. No acute intracranial abnormality. Mild for age nonspecific cerebral white matter signal changes. 2. Mild to moderate ethmoid and maxillary sinus mucosal thickening. Electronically Signed   By: Genevie Ann M.D.   On: 08/03/2017 15:05   Dg Chest Portable 1 View  Result Date: 08/03/2017 CLINICAL DATA:  Shortness of Breath EXAM: PORTABLE CHEST 1 VIEW COMPARISON:  07/14/2017 FINDINGS: The heart size and mediastinal contours are within normal limits. Both lungs are clear. The visualized skeletal structures are unremarkable. IMPRESSION: No active disease. Electronically Signed   By: Inez Catalina M.D.   On: 08/03/2017 12:33      Patient Profile   83 yo with history of DM and HTN as well as recent onset atrial fibrillation and CHF seen today for evaluation of CHF.   Assessment/Plan   1. Acute on chronic diastolic CHF: Echo 53/66 with EF 50-55%.  Main complaint bringing him to the ER is dyspnea.  He was profoundly short of breath after ambulation earlier today.  I suspect that the proximate trigger for CHF exacerbation is atrial fibrillation.  He was found to be in atrial fibrillation about 2 wks ago and has been short of breath and on escalating Lasix doses since that time.  On exam today, he is volume overloaded though not markedly.  BNP mildly elevated.  - Lasix 40 mg IV bid with KCl 20 daily.  - Ultimately, I think that he needs to get back into NSR (see below).  - Cycle troponin though low suspicion for ACS.  Negative Cardiolite in 1/16.  2. CKD: Stage  3.  Creatinine 1.4 today.  Follow with diuresis.  3. Atrial fibrillation: Rate is controlled on Coreg but he has  had symptomatic diastolic CHF since diagnosis of atrial fibrillation was made.  He has been on Eliquis for about 2 wks.  - He needs to get back into NSR.  Will plan for TEE-guided DCCV on Monday (TEE b/c only 2 wks of Eliquis).  - If he does not maintain NSR, will need to consider antiarrhythmic (probably amiodarone).   - Continue Coreg for rate control.  4. Diabetes: Hold metformin for now and treat with sliding scale.  5. HTN: With mild rise in creatinine, can decrease lisinopril to 5 mg daily and hold off on terazosin for now.    Loralie Champagne, MD 08/03/2017, 3:20 PM  Advanced Heart Failure Team Pager [phone] (M-F; 7a - 4p)  Please contact [facility] for night-coverage after hours (4p -7a ) and weekends on [email or web address]

## 2017-08-04 DIAGNOSIS — I5031 Acute diastolic (congestive) heart failure: Secondary | ICD-10-CM

## 2017-08-04 LAB — BASIC METABOLIC PANEL
ANION GAP: 14 (ref 5–15)
BUN: 25 mg/dL — ABNORMAL HIGH (ref 6–20)
CALCIUM: 9.3 mg/dL (ref 8.9–10.3)
CO2: 24 mmol/L (ref 22–32)
CREATININE: 1.42 mg/dL — AB (ref 0.61–1.24)
Chloride: 100 mmol/L — ABNORMAL LOW (ref 101–111)
GFR, EST AFRICAN AMERICAN: 49 mL/min — AB (ref >60)
GFR, EST NON AFRICAN AMERICAN: 42 mL/min — AB (ref >60)
Glucose, Bld: 99 mg/dL (ref 65–99)
Potassium: 3.8 mmol/L (ref 3.5–5.1)
SODIUM: 138 mmol/L (ref 135–145)

## 2017-08-04 LAB — GLUCOSE, CAPILLARY
GLUCOSE-CAPILLARY: 105 mg/dL — AB (ref 65–99)
GLUCOSE-CAPILLARY: 116 mg/dL — AB (ref 65–99)
GLUCOSE-CAPILLARY: 119 mg/dL — AB (ref 65–99)
Glucose-Capillary: 102 mg/dL — ABNORMAL HIGH (ref 65–99)

## 2017-08-04 LAB — TROPONIN I

## 2017-08-04 MED ORDER — CARVEDILOL 6.25 MG PO TABS
6.2500 mg | ORAL_TABLET | Freq: Two times a day (BID) | ORAL | Status: DC
  Administered 2017-08-04 – 2017-08-06 (×4): 6.25 mg via ORAL
  Filled 2017-08-04 (×4): qty 1

## 2017-08-04 MED ORDER — SODIUM CHLORIDE 0.9% FLUSH: 3.0000 mL | INTRAVENOUS | Status: DC | PRN

## 2017-08-04 MED ORDER — SODIUM CHLORIDE 0.9% FLUSH
3.0000 mL | Freq: Two times a day (BID) | INTRAVENOUS | Status: DC
  Administered 2017-08-04 – 2017-08-06 (×5): 3 mL via INTRAVENOUS

## 2017-08-04 MED ORDER — SODIUM CHLORIDE 0.9 % IV SOLN: 250.0000 mL | INTRAVENOUS | Status: DC

## 2017-08-04 NOTE — Plan of Care (Signed)
Patient without complaint on 7 a to 7 p shift, BP mildly soft.  Patient aware he is nothing by mouth after midnight, consent form signed for TEE cardioversion on 08/05/17.  Wife at bedside.

## 2017-08-04 NOTE — Progress Notes (Signed)
Patient ID: Dylan [name], male   DOB: March 22, 1926, 83 y.o.   MRN: [id number]     Advanced Heart Failure Rounding Note  Primary Cardiologist: Radford Pax HF Cardiology: Aundra Dubin  Subjective:    Breathing better with diuresis.  Felt dizzy sitting in chair, SBP down to 100s at times.  Still in atrial fibrillation with HR in 90s.    Objective:   Weight Range: 246 lb 14.4 oz (112 kg) Body mass index is 33.49 kg/m.   Vital Signs:   Temp:  [97.7 F (36.5 C)-98 F (36.7 C)] 98 F (36.7 C) (12/16 0812) Pulse Rate:  [41-111] 65 (12/16 0812) Resp:  [14-20] 18 (12/16 0812) BP: (99-121)/(43-83) 108/63 (12/16 0812) SpO2:  [91 %-99 %] 91 % (12/16 0812) Weight:  [246 lb 14.4 oz (112 kg)-263 lb (119.3 kg)] 246 lb 14.4 oz (112 kg) (12/16 0540) Last BM Date: 08/02/17  Weight change: Filed Weights   08/03/17 1150 08/03/17 1600 08/04/17 0540  Weight: 263 lb (119.3 kg) 248 lb 6 oz (112.7 kg) 246 lb 14.4 oz (112 kg)    Intake/Output:   Intake/Output Summary (Last 24 hours) at 08/04/2017 1056 Last data filed at 08/04/2017 0717 Gross per 24 hour  Intake 240 ml  Output 2600 ml  Net -2360 ml      Physical Exam    General:  Well appearing. No resp difficulty HEENT: Normal Neck: Supple. JVP 9-10 cm.  No lymphadenopathy or thyromegaly appreciated. Cor: PMI nondisplaced. Irregular rate & rhythm. No rubs, gallops or murmurs. Lungs: Clear Abdomen: Soft, nontender, nondistended. No hepatosplenomegaly. No bruits or masses. Good bowel sounds. Extremities: No cyanosis, clubbing, rash. Trace ankle edema Neuro: Alert & orientedx3, cranial nerves grossly intact. moves all 4 extremities w/o difficulty. Affect pleasant   Telemetry   Atrial fibrillation 90s (personally reviewed)   Labs    CBC Recent Labs    08/03/17 1158  WBC 6.7  HGB 13.3  HCT 40.0  MCV 95.0  PLT 353   Basic Metabolic Panel Recent Labs    08/03/17 1158 08/04/17 0338  NA 135 138  K 4.0 3.8  CL 101 100*  CO2 26 24    GLUCOSE 78 99  BUN 22* 25*  CREATININE 1.42* 1.42*  CALCIUM 9.1 9.3   Liver Function Tests Recent Labs    08/03/17 1158  AST 22  ALT 17  ALKPHOS 61  BILITOT 2.1*  PROT 6.5  ALBUMIN 3.9   No results for input(s): LIPASE, AMYLASE in the last 72 hours. Cardiac Enzymes Recent Labs    08/03/17 1527 08/03/17 2052 08/04/17 0338  TROPONINI <0.03 <0.03 <0.03    BNP: BNP (last 3 results) Recent Labs    07/14/17 1601 08/03/17 1158  BNP 341.8* 318.9*    ProBNP (last 3 results) Recent Labs    08/01/17 1517  PROBNP 1,490*     D-Dimer No results for input(s): DDIMER in the last 72 hours. Hemoglobin A1C No results for input(s): HGBA1C in the last 72 hours. Fasting Lipid Panel No results for input(s): CHOL, HDL, LDLCALC, TRIG, CHOLHDL, LDLDIRECT in the last 72 hours. Thyroid Function Tests No results for input(s): TSH, T4TOTAL, T3FREE, THYROIDAB in the last 72 hours.  Invalid input(s): FREET3  Other results:   Imaging    Ct Head Wo Contrast  Result Date: 08/03/2017 CLINICAL DATA:  83 year old male with altered level of consciousness, pallor. EXAM: CT HEAD WITHOUT CONTRAST TECHNIQUE: Contiguous axial images were obtained from the base of the skull through  the vertex without intravenous contrast. COMPARISON:  PET-CT 01/01/2007. FINDINGS: Brain: Cerebral and cerebellar volume loss appears generalized. Mild for age cerebral white matter hypodensity. Elsewhere Gray-white matter differentiation is within normal limits throughout the brain. No midline shift, ventriculomegaly, mass effect, evidence of mass lesion, intracranial hemorrhage or evidence of cortically based acute infarction. No cortical encephalomalacia identified. Vascular: Calcified atherosclerosis at the skull base. No suspicious intracranial vascular hyperdensity. Skull: No acute osseous abnormality identified. Sinuses/Orbits: Chronic right mastoidectomy is stable since 2008. Mild to moderate bilateral ethmoid  and maxillary sinus mucosal thickening. No sinus fluid level identified. Other: No acute orbit or scalp soft tissue finding. IMPRESSION: 1. No acute intracranial abnormality. Mild for age nonspecific cerebral white matter signal changes. 2. Mild to moderate ethmoid and maxillary sinus mucosal thickening. Electronically Signed   By: Genevie Ann M.D.   On: 08/03/2017 15:05   Dg Chest Portable 1 View  Result Date: 08/03/2017 CLINICAL DATA:  Shortness of Breath EXAM: PORTABLE CHEST 1 VIEW COMPARISON:  07/14/2017 FINDINGS: The heart size and mediastinal contours are within normal limits. Both lungs are clear. The visualized skeletal structures are unremarkable. IMPRESSION: No active disease. Electronically Signed   By: Inez Catalina M.D.   On: 08/03/2017 12:33      Medications:     Scheduled Medications: . apixaban  5 mg Oral BID  . carvedilol  6.25 mg Oral BID WC  . furosemide  40 mg Intravenous Q12H  . gabapentin  300 mg Oral BID  . insulin aspart  0-15 Units Subcutaneous TID WC  . potassium chloride  40 mEq Oral BID  . simvastatin  40 mg Oral q1800  . sodium chloride flush  3 mL Intravenous Q12H  . sodium chloride flush  3 mL Intravenous Q12H     Infusions: . sodium chloride    . sodium chloride       PRN Medications:  sodium chloride, ondansetron (ZOFRAN) IV, sodium chloride flush, sodium chloride flush    Patient Profile   83 yo with history of DM and HTN as well as recent onset atrial fibrillation and CHF seen today for evaluation of CHF.    Assessment/Plan   1. Acute on chronic diastolic CHF: Echo 57/84 with EF 50-55%.  Main complaint bringing him to the ER is dyspnea.  He was profoundly short of breath after ambulation the day of admission.  I suspect that the proximate trigger for CHF exacerbation is atrial fibrillation.  He was found to be in atrial fibrillation about 2 wks ago and has been short of breath and on escalating Lasix doses since that time.  He diuresed well  yesterday, weight down 2 lbs.  Still mild volume overload on exam.  Creatinine stable. Troponin negative x 3.  - Lasix 40 mg IV bid with KCl 20 daily again today.  - Ultimately, I think that he needs to get back into NSR (see below).  2. CKD: Stage 3.  Creatinine 1.4 today, stable.  Follow with diuresis.  3. Atrial fibrillation: Rate is controlled on Coreg but he has had symptomatic diastolic CHF since diagnosis of atrial fibrillation was made.  He has been on Eliquis for about 2 wks.  - He needs to get back into NSR.  Will plan for TEE-guided DCCV on Monday (TEE b/c only 2 wks of Eliquis).  - If he does not maintain NSR, will need to consider antiarrhythmic (probably amiodarone).   - Continue Coreg for rate control but decrease dose to 6.25 mg  bid with soft BP.  4. Diabetes: Hold metformin for now and treat with sliding scale.  5. HTN: Holding lisinopril and terazosin with soft BP.   Length of Stay: 1  Loralie Champagne, MD  08/04/2017, 10:56 AM  Advanced Heart Failure Team Pager [phone] (M-F; 7a - 4p)  Please contact [facility] for night-coverage after hours (4p -7a ) and weekends on [email or web address]

## 2017-08-05 ENCOUNTER — Inpatient Hospital Stay (HOSPITAL_COMMUNITY): Payer: Medicare HMO | Admitting: Anesthesiology

## 2017-08-05 ENCOUNTER — Encounter (HOSPITAL_COMMUNITY)
Admission: EM | Disposition: A | Discharge status: Home Health | Payer: Self-pay | Source: Home / Self Care | Attending: Cardiology

## 2017-08-05 ENCOUNTER — Encounter (HOSPITAL_COMMUNITY): Payer: Self-pay | Admitting: *Deleted

## 2017-08-05 ENCOUNTER — Inpatient Hospital Stay (HOSPITAL_COMMUNITY): Payer: Medicare HMO

## 2017-08-05 DIAGNOSIS — I340 Nonrheumatic mitral (valve) insufficiency: Secondary | ICD-10-CM

## 2017-08-05 HISTORY — PX: TEE WITHOUT CARDIOVERSION: SHX5443

## 2017-08-05 HISTORY — PX: CARDIOVERSION: SHX1299

## 2017-08-05 LAB — CBC WITH DIFFERENTIAL/PLATELET
BASOS ABS: 0.1 10*3/uL (ref 0.0–0.1)
Basophils Relative: 1 %
EOS ABS: 0.2 10*3/uL (ref 0.0–0.7)
EOS PCT: 3 %
HCT: 40.8 % (ref 39.0–52.0)
Hemoglobin: 13.5 g/dL (ref 13.0–17.0)
LYMPHS PCT: 36 %
Lymphs Abs: 2.8 10*3/uL (ref 0.7–4.0)
MCH: 31.2 pg (ref 26.0–34.0)
MCHC: 33.1 g/dL (ref 30.0–36.0)
MCV: 94.2 fL (ref 78.0–100.0)
MONO ABS: 0.5 10*3/uL (ref 0.1–1.0)
Monocytes Relative: 6 %
Neutro Abs: 4.2 10*3/uL (ref 1.7–7.7)
Neutrophils Relative %: 54 %
PLATELETS: 154 10*3/uL (ref 150–400)
RBC: 4.33 MIL/uL (ref 4.22–5.81)
RDW: 13.1 % (ref 11.5–15.5)
WBC: 7.8 10*3/uL (ref 4.0–10.5)

## 2017-08-05 LAB — BASIC METABOLIC PANEL
Anion gap: 8 (ref 5–15)
BUN: 32 mg/dL — AB (ref 6–20)
CALCIUM: 9.4 mg/dL (ref 8.9–10.3)
CO2: 27 mmol/L (ref 22–32)
CREATININE: 1.66 mg/dL — AB (ref 0.61–1.24)
Chloride: 102 mmol/L (ref 101–111)
GFR calc Af Amer: 41 mL/min — ABNORMAL LOW (ref >60)
GFR calc non Af Amer: 35 mL/min — ABNORMAL LOW (ref >60)
GLUCOSE: 108 mg/dL — AB (ref 65–99)
POTASSIUM: 4.3 mmol/L (ref 3.5–5.1)
Sodium: 137 mmol/L (ref 135–145)

## 2017-08-05 LAB — GLUCOSE, CAPILLARY
GLUCOSE-CAPILLARY: 106 mg/dL — AB (ref 65–99)
Glucose-Capillary: 123 mg/dL — ABNORMAL HIGH (ref 65–99)
Glucose-Capillary: 108 mg/dL — ABNORMAL HIGH (ref 65–99)
Glucose-Capillary: 128 mg/dL — ABNORMAL HIGH (ref 65–99)

## 2017-08-05 SURGERY — ECHOCARDIOGRAM, TRANSESOPHAGEAL
Anesthesia: General

## 2017-08-05 MED ORDER — PROPOFOL 500 MG/50ML IV EMUL
INTRAVENOUS | Status: DC | PRN
  Administered 2017-08-05: 100 ug/kg/min via INTRAVENOUS

## 2017-08-05 MED ORDER — SODIUM CHLORIDE 0.9 % IV SOLN
INTRAVENOUS | Status: DC
  Administered 2017-08-05: 10:00:00 via INTRAVENOUS

## 2017-08-05 MED ORDER — APIXABAN 2.5 MG PO TABS
2.5000 mg | ORAL_TABLET | Freq: Two times a day (BID) | ORAL | Status: DC
  Administered 2017-08-05 – 2017-08-06 (×2): 2.5 mg via ORAL
  Filled 2017-08-05 (×2): qty 1

## 2017-08-05 MED ORDER — LACTATED RINGERS IV SOLN
INTRAVENOUS | Status: DC
  Administered 2017-08-05: 1000 mL via INTRAVENOUS

## 2017-08-05 NOTE — Procedures (Signed)
Electrical Cardioversion Procedure Note Dylan [name] [id number] 09-24-1925  Procedure: Electrical Cardioversion Indications:  Atrial Fibrillation  Procedure Details Consent: Risks of procedure as well as the alternatives and risks of each were explained to the (patient/caregiver).  Consent for procedure obtained. Time Out: Verified patient identification, verified procedure, site/side was marked, verified correct patient position, special equipment/implants available, medications/allergies/relevent history reviewed, required imaging and test results available.  Performed  Patient placed on cardiac monitor, pulse oximetry, supplemental oxygen as necessary.  Sedation given: Propofol per anesthesiology Pacer pads placed anterior and posterior chest.  Cardioverted 1 time(s).  Cardioverted at [place].  Evaluation Findings: Post procedure EKG shows: NSR Complications: None Patient did tolerate procedure well.   Loralie Champagne 08/05/2017, 1:37 PM

## 2017-08-05 NOTE — Care Management Note (Addendum)
Case Management Note  Patient Details  Name: ABDULKADIR EMMANUEL MRN: [id number] Date of Birth: 1926/05/18  Subjective/Objective:    CHF               Action/Plan: Patient lives at home with spouse; PCP is Dr [name]; has private insurance with [employer] with prescription drug coverage; pharmacy of choice is [facility]; patient states that he has no problem getting his medication; DME at home walker and cane; he is requesting a rolling walker with a seat at discharge, pt stated " my legs give out."Patient would benefit from a Disease Management Program for CHF; [place] choice offered, pt chose [facility]; [place] with [facility] called for arrangements.  08/06/2017- Received call that [facility] is not in network with the patient's insurance. [facility] called, they can accept the patient; pt made aware.  Expected Discharge Date:    possibly 08/06/2017              Expected Discharge Plan:  [place]  Discharge planning Services  CM Consult     Choice offered to:  Patient, Spouse  DME Arranged:  Walker rolling with seat DME Agency:  [place]:  RN, Disease Management, PT [place] Agency:  [facility] (now [facility])  Status of Service:  In process, will continue to follow  Sherrilyn Rist [phone] 08/05/2017, 11:52 AM

## 2017-08-05 NOTE — Transfer of Care (Signed)
Immediate Anesthesia Transfer of Care Note  Patient: Dylan [name]  Procedure(s) Performed: TRANSESOPHAGEAL ECHOCARDIOGRAM (TEE) (N/A ) CARDIOVERSION (N/A )  Patient Location: PACU and Endoscopy Unit  Anesthesia Type:MAC  Level of Consciousness: sedated  Airway & Oxygen Therapy: Patient Spontanous Breathing and Patient connected to face mask oxygen  Post-op Assessment: Report given to RN and Post -op Vital signs reviewed and stable  Post vital signs: Reviewed and stable  Last Vitals:  Vitals:   08/05/17 1100 08/05/17 1217  BP: 125/76 125/80  Pulse: 76 95  Resp:  19  Temp:  36.4 C  SpO2: 98% 94%    Last Pain:  Vitals:   08/05/17 1217  TempSrc: Oral  PainSc:          Complications: No apparent anesthesia complications

## 2017-08-05 NOTE — Progress Notes (Signed)
Patient ID: Dylan [name], male   DOB: 1925/12/10, 83 y.o.   MRN: [id number]     Advanced Heart Failure Rounding Note  Primary Cardiologist: [facility] Cardiology: Aundra Dubin  Subjective:    No complaints this morning. Breathing better with diuresis.  No lightheadedness.  He remains in atrial fibrillation.    Objective:   Weight Range: 247 lb 3.2 oz (112.1 kg) Body mass index is 33.53 kg/m.   Vital Signs:   Temp:  [98 F (36.7 C)-98.6 F (37 C)] 98.6 F (37 C) (12/17 0559) Pulse Rate:  [65-90] 72 (12/17 0559) Resp:  [16-18] 17 (12/17 0559) BP: (92-114)/(51-68) 114/68 (12/17 0559) SpO2:  [90 %-95 %] 95 % (12/17 0559) Weight:  [247 lb 3.2 oz (112.1 kg)] 247 lb 3.2 oz (112.1 kg) (12/17 0559) Last BM Date: 08/04/17  Weight change: Filed Weights   08/03/17 1600 08/04/17 0540 08/05/17 0559  Weight: 248 lb 6 oz (112.7 kg) 246 lb 14.4 oz (112 kg) 247 lb 3.2 oz (112.1 kg)    Intake/Output:   Intake/Output Summary (Last 24 hours) at 08/05/2017 0801 Last data filed at 08/05/2017 6606 Gross per 24 hour  Intake 240 ml  Output 1400 ml  Net -1160 ml      Physical Exam    General: NAD Neck: JVP 7-8 cm, no thyromegaly or thyroid nodule.  Lungs: Clear to auscultation bilaterally with normal respiratory effort. CV: Nondisplaced PMI.  Heart irregular S1/S2, no S3/S4, no murmur.  No peripheral edema.   Abdomen: Soft, nontender, no hepatosplenomegaly, no distention.  Skin: Intact without lesions or rashes.  Neurologic: Alert and oriented x 3.  Psych: Normal affect. Extremities: No clubbing or cyanosis.  HEENT: Normal.    Telemetry   Atrial fibrillation 90s-100s (personally reviewed)   Labs    CBC Recent Labs    08/03/17 1158 08/05/17 0457  WBC 6.7 7.8  NEUTROABS  --  4.2  HGB 13.3 13.5  HCT 40.0 40.8  MCV 95.0 94.2  PLT 156 301   Basic Metabolic Panel Recent Labs    08/04/17 0338 08/05/17 0457  NA 138 137  K 3.8 4.3  CL 100* 102  CO2 24 27  GLUCOSE 99  108*  BUN 25* 32*  CREATININE 1.42* 1.66*  CALCIUM 9.3 9.4   Liver Function Tests Recent Labs    08/03/17 1158  AST 22  ALT 17  ALKPHOS 61  BILITOT 2.1*  PROT 6.5  ALBUMIN 3.9   No results for input(s): LIPASE, AMYLASE in the last 72 hours. Cardiac Enzymes Recent Labs    08/03/17 1527 08/03/17 2052 08/04/17 0338  TROPONINI <0.03 <0.03 <0.03    BNP: BNP (last 3 results) Recent Labs    07/14/17 1601 08/03/17 1158  BNP 341.8* 318.9*    ProBNP (last 3 results) Recent Labs    08/01/17 1517  PROBNP 1,490*     D-Dimer No results for input(s): DDIMER in the last 72 hours. Hemoglobin A1C No results for input(s): HGBA1C in the last 72 hours. Fasting Lipid Panel No results for input(s): CHOL, HDL, LDLCALC, TRIG, CHOLHDL, LDLDIRECT in the last 72 hours. Thyroid Function Tests No results for input(s): TSH, T4TOTAL, T3FREE, THYROIDAB in the last 72 hours.  Invalid input(s): FREET3  Other results:   Imaging    No results found.   Medications:     Scheduled Medications: . apixaban  5 mg Oral BID  . carvedilol  6.25 mg Oral BID WC  . gabapentin  300 mg Oral  BID  . insulin aspart  0-15 Units Subcutaneous TID WC  . potassium chloride  40 mEq Oral BID  . simvastatin  40 mg Oral q1800  . sodium chloride flush  3 mL Intravenous Q12H  . sodium chloride flush  3 mL Intravenous Q12H    Infusions: . sodium chloride    . sodium chloride      PRN Medications: sodium chloride, ondansetron (ZOFRAN) IV, sodium chloride flush, sodium chloride flush    Patient Profile   83 yo with history of DM and HTN as well as recent onset atrial fibrillation and CHF seen today for evaluation of CHF.    Assessment/Plan   1. Acute on chronic diastolic CHF: Echo 48/18 with EF 50-55%.  Main complaint bringing him to the ER is dyspnea.  He was profoundly short of breath after ambulation the day of admission.  I suspect that the proximate trigger for CHF exacerbation is  atrial fibrillation.  He was found to be in atrial fibrillation about 2 wks ago and has been short of breath and on escalating Lasix doses since that time.  He has diuresed well and does not look volume overloaded on exam.  Creatinine up to 1.6.    - Hold IV Lasix today, restart Lasix 40 mg po daily tomorrow.   - Ultimately, I think that he needs to get back into NSR (see below).  2. CKD: Stage 3.  Creatinine 1.6 today, higher with diuresis.  Holding Lasix today.  3. Atrial fibrillation: Rate is controlled on Coreg but he has had symptomatic diastolic CHF since diagnosis of atrial fibrillation was made.  He has been on Eliquis for about 2 wks.  - He needs to get back into NSR.  Will plan for TEE-guided DCCV today (TEE b/c only 2 wks of Eliquis).  - If he does not maintain NSR, will need to consider antiarrhythmic (probably amiodarone).   - Continue Coreg for rate control but have decreased dose to 6.25 mg bid with soft BP.  4. Diabetes: Hold metformin for now and treat with sliding scale.  5. HTN: Holding lisinopril and terazosin with soft BP.   If he is doing well after DCCV, he could potentially go home today.   Length of Stay: 2  Loralie Champagne, MD  08/05/2017, 8:01 AM  Advanced Heart Failure Team Pager [phone] (M-F; 7a - 4p)  Please contact [facility] for night-coverage after hours (4p -7a ) and weekends on [email or web address]

## 2017-08-05 NOTE — Interval H&P Note (Signed)
History and Physical Interval Note:  08/05/2017 1:09 PM  Dylan [name]  has presented today for surgery, with the diagnosis of afib  The various methods of treatment have been discussed with the patient and family. After consideration of risks, benefits and other options for treatment, the patient has consented to  Procedure(s): TRANSESOPHAGEAL ECHOCARDIOGRAM (TEE) (N/A) CARDIOVERSION (N/A) as a surgical intervention .  The patient's history has been reviewed, patient examined, no change in status, stable for surgery.  I have reviewed the patient's chart and labs.  Questions were answered to the patient's satisfaction.     Samir Ishaq [employer]

## 2017-08-05 NOTE — H&P (View-Only) (Signed)
Patient ID: Dylan [name], male   DOB: 09-02-1925, 83 y.o.   MRN: [id number]     Advanced Heart Failure Rounding Note  Primary Cardiologist: [facility] Cardiology: Aundra Dubin  Subjective:    No complaints this morning. Breathing better with diuresis.  No lightheadedness.  He remains in atrial fibrillation.    Objective:   Weight Range: 247 lb 3.2 oz (112.1 kg) Body mass index is 33.53 kg/m.   Vital Signs:   Temp:  [98 F (36.7 C)-98.6 F (37 C)] 98.6 F (37 C) (12/17 0559) Pulse Rate:  [65-90] 72 (12/17 0559) Resp:  [16-18] 17 (12/17 0559) BP: (92-114)/(51-68) 114/68 (12/17 0559) SpO2:  [90 %-95 %] 95 % (12/17 0559) Weight:  [247 lb 3.2 oz (112.1 kg)] 247 lb 3.2 oz (112.1 kg) (12/17 0559) Last BM Date: 08/04/17  Weight change: Filed Weights   08/03/17 1600 08/04/17 0540 08/05/17 0559  Weight: 248 lb 6 oz (112.7 kg) 246 lb 14.4 oz (112 kg) 247 lb 3.2 oz (112.1 kg)    Intake/Output:   Intake/Output Summary (Last 24 hours) at 08/05/2017 0801 Last data filed at 08/05/2017 7829 Gross per 24 hour  Intake 240 ml  Output 1400 ml  Net -1160 ml      Physical Exam    General: NAD Neck: JVP 7-8 cm, no thyromegaly or thyroid nodule.  Lungs: Clear to auscultation bilaterally with normal respiratory effort. CV: Nondisplaced PMI.  Heart irregular S1/S2, no S3/S4, no murmur.  No peripheral edema.   Abdomen: Soft, nontender, no hepatosplenomegaly, no distention.  Skin: Intact without lesions or rashes.  Neurologic: Alert and oriented x 3.  Psych: Normal affect. Extremities: No clubbing or cyanosis.  HEENT: Normal.    Telemetry   Atrial fibrillation 90s-100s (personally reviewed)   Labs    CBC Recent Labs    08/03/17 1158 08/05/17 0457  WBC 6.7 7.8  NEUTROABS  --  4.2  HGB 13.3 13.5  HCT 40.0 40.8  MCV 95.0 94.2  PLT 156 562   Basic Metabolic Panel Recent Labs    08/04/17 0338 08/05/17 0457  NA 138 137  K 3.8 4.3  CL 100* 102  CO2 24 27  GLUCOSE 99  108*  BUN 25* 32*  CREATININE 1.42* 1.66*  CALCIUM 9.3 9.4   Liver Function Tests Recent Labs    08/03/17 1158  AST 22  ALT 17  ALKPHOS 61  BILITOT 2.1*  PROT 6.5  ALBUMIN 3.9   No results for input(s): LIPASE, AMYLASE in the last 72 hours. Cardiac Enzymes Recent Labs    08/03/17 1527 08/03/17 2052 08/04/17 0338  TROPONINI <0.03 <0.03 <0.03    BNP: BNP (last 3 results) Recent Labs    07/14/17 1601 08/03/17 1158  BNP 341.8* 318.9*    ProBNP (last 3 results) Recent Labs    08/01/17 1517  PROBNP 1,490*     D-Dimer No results for input(s): DDIMER in the last 72 hours. Hemoglobin A1C No results for input(s): HGBA1C in the last 72 hours. Fasting Lipid Panel No results for input(s): CHOL, HDL, LDLCALC, TRIG, CHOLHDL, LDLDIRECT in the last 72 hours. Thyroid Function Tests No results for input(s): TSH, T4TOTAL, T3FREE, THYROIDAB in the last 72 hours.  Invalid input(s): FREET3  Other results:   Imaging    No results found.   Medications:     Scheduled Medications: . apixaban  5 mg Oral BID  . carvedilol  6.25 mg Oral BID WC  . gabapentin  300 mg Oral  BID  . insulin aspart  0-15 Units Subcutaneous TID WC  . potassium chloride  40 mEq Oral BID  . simvastatin  40 mg Oral q1800  . sodium chloride flush  3 mL Intravenous Q12H  . sodium chloride flush  3 mL Intravenous Q12H    Infusions: . sodium chloride    . sodium chloride      PRN Medications: sodium chloride, ondansetron (ZOFRAN) IV, sodium chloride flush, sodium chloride flush    Patient Profile   83 yo with history of DM and HTN as well as recent onset atrial fibrillation and CHF seen today for evaluation of CHF.    Assessment/Plan   1. Acute on chronic diastolic CHF: Echo 59/74 with EF 50-55%.  Main complaint bringing him to the ER is dyspnea.  He was profoundly short of breath after ambulation the day of admission.  I suspect that the proximate trigger for CHF exacerbation is  atrial fibrillation.  He was found to be in atrial fibrillation about 2 wks ago and has been short of breath and on escalating Lasix doses since that time.  He has diuresed well and does not look volume overloaded on exam.  Creatinine up to 1.6.    - Hold IV Lasix today, restart Lasix 40 mg po daily tomorrow.   - Ultimately, I think that he needs to get back into NSR (see below).  2. CKD: Stage 3.  Creatinine 1.6 today, higher with diuresis.  Holding Lasix today.  3. Atrial fibrillation: Rate is controlled on Coreg but he has had symptomatic diastolic CHF since diagnosis of atrial fibrillation was made.  He has been on Eliquis for about 2 wks.  - He needs to get back into NSR.  Will plan for TEE-guided DCCV today (TEE b/c only 2 wks of Eliquis).  - If he does not maintain NSR, will need to consider antiarrhythmic (probably amiodarone).   - Continue Coreg for rate control but have decreased dose to 6.25 mg bid with soft BP.  4. Diabetes: Hold metformin for now and treat with sliding scale.  5. HTN: Holding lisinopril and terazosin with soft BP.   If he is doing well after DCCV, he could potentially go home today.   Length of Stay: 2  Loralie Champagne, MD  08/05/2017, 8:01 AM  Advanced Heart Failure Team Pager [phone] (M-F; 7a - 4p)  Please contact [facility] for night-coverage after hours (4p -7a ) and weekends on [email or web address]

## 2017-08-05 NOTE — Anesthesia Postprocedure Evaluation (Signed)
Anesthesia Post Note  Patient: SAADIQ POCHE  Procedure(s) Performed: TRANSESOPHAGEAL ECHOCARDIOGRAM (TEE) (N/A ) CARDIOVERSION (N/A )     Patient location during evaluation: Endoscopy Anesthesia Type: MAC Level of consciousness: awake and alert Pain management: pain level controlled Vital Signs Assessment: post-procedure vital signs reviewed and stable Respiratory status: spontaneous breathing, nonlabored ventilation, respiratory function stable and patient connected to nasal cannula oxygen Cardiovascular status: stable and blood pressure returned to baseline Postop Assessment: no apparent nausea or vomiting Anesthetic complications: no    Last Vitals:  Vitals:   08/05/17 1355 08/05/17 1438  BP: 105/63 108/70  Pulse: (!) 59 60  Resp: 11   Temp:    SpO2: 95% 96%    Last Pain:  Vitals:   08/05/17 1342  TempSrc: Axillary  PainSc:                  Azaya Goedde,JAMES TERRILL

## 2017-08-05 NOTE — CV Procedure (Addendum)
Procedure: TEE  Indication: Atrial fibrillation.   Sedation: Propofol per anesthesiology  Findings: Please see echo section for full report.  Normal LV size and wall thickness.  Mild diffuse hypokinesis, EF 50%.  Normal RV size and systolic function.  Mild left atrial enlargement, no LA appendage thrombus.  No PFO/ASD (negative bubble study).  Trivial TR.  Mild MR.  Trileaflet aortic valve with mild calcification.  Trivial aortic insufficiency, no aortic stenosis.  Grade III plaque descending thoracic aorta.    No LAA thrombus, ok for DCCV.   Loralie Champagne 08/05/2017 1:37 PM

## 2017-08-05 NOTE — Progress Notes (Signed)
  Echocardiogram Echocardiogram Transesophageal has been performed.  Dylan [name] 08/05/2017, 1:48 PM

## 2017-08-05 NOTE — Anesthesia Preprocedure Evaluation (Signed)
Anesthesia Evaluation  Patient identified by MRN, date of birth, ID band Patient awake    Reviewed: Allergy & Precautions, NPO status , Patient's Chart, lab work & pertinent test results  History of Anesthesia Complications Negative for: history of anesthetic complications  Airway Mallampati: II  TM Distance: >3 FB Neck ROM: Full    Dental  (+) Teeth Intact   Pulmonary neg pulmonary ROS, former smoker,    breath sounds clear to auscultation       Cardiovascular hypertension, +CHF and + DOE  + dysrhythmias  Rhythm:Irregular Rate:Tachycardia     Neuro/Psych negative neurological ROS     GI/Hepatic negative GI ROS, Neg liver ROS,   Endo/Other  diabetesMorbid obesity  Renal/GU      Musculoskeletal   Abdominal (+) + obese,   Peds  Hematology   Anesthesia Other Findings   Reproductive/Obstetrics                             Anesthesia Physical Anesthesia Plan  ASA: III  Anesthesia Plan: General   Post-op Pain Management:    Induction: Intravenous  PONV Risk Score and Plan: 2 and Treatment may vary due to age or medical condition  Airway Management Planned: Mask  Additional Equipment:   Intra-op Plan:   Post-operative Plan:   Informed Consent: I have reviewed the patients History and Physical, chart, labs and discussed the procedure including the risks, benefits and alternatives for the proposed anesthesia with the patient or authorized representative who has indicated his/her understanding and acceptance.   Dental advisory given  Plan Discussed with: CRNA  Anesthesia Plan Comments:         Anesthesia Quick Evaluation

## 2017-08-06 LAB — CBC WITH DIFFERENTIAL/PLATELET
Basophils Absolute: 0.1 10*3/uL (ref 0.0–0.1)
Basophils Relative: 1 %
EOS PCT: 3 %
Eosinophils Absolute: 0.2 10*3/uL (ref 0.0–0.7)
HEMATOCRIT: 40.7 % (ref 39.0–52.0)
Hemoglobin: 13.2 g/dL (ref 13.0–17.0)
LYMPHS ABS: 2.9 10*3/uL (ref 0.7–4.0)
LYMPHS PCT: 37 %
MCH: 31.1 pg (ref 26.0–34.0)
MCHC: 32.4 g/dL (ref 30.0–36.0)
MCV: 96 fL (ref 78.0–100.0)
MONO ABS: 0.5 10*3/uL (ref 0.1–1.0)
Monocytes Relative: 6 %
NEUTROS ABS: 4.2 10*3/uL (ref 1.7–7.7)
Neutrophils Relative %: 53 %
PLATELETS: 152 10*3/uL (ref 150–400)
RBC: 4.24 MIL/uL (ref 4.22–5.81)
RDW: 13.4 % (ref 11.5–15.5)
WBC: 7.8 10*3/uL (ref 4.0–10.5)

## 2017-08-06 LAB — BASIC METABOLIC PANEL
Anion gap: 10 (ref 5–15)
BUN: 39 mg/dL — ABNORMAL HIGH (ref 6–20)
CO2: 25 mmol/L (ref 22–32)
Calcium: 9.4 mg/dL (ref 8.9–10.3)
Chloride: 100 mmol/L — ABNORMAL LOW (ref 101–111)
Creatinine, Ser: 1.85 mg/dL — ABNORMAL HIGH (ref 0.61–1.24)
GFR calc Af Amer: 36 mL/min — ABNORMAL LOW (ref >60)
GFR calc non Af Amer: 31 mL/min — ABNORMAL LOW (ref >60)
Glucose, Bld: 106 mg/dL — ABNORMAL HIGH (ref 65–99)
Potassium: 5.1 mmol/L (ref 3.5–5.1)
Sodium: 135 mmol/L (ref 135–145)

## 2017-08-06 LAB — GLUCOSE, CAPILLARY: Glucose-Capillary: 102 mg/dL — ABNORMAL HIGH (ref 65–99)

## 2017-08-06 MED ORDER — CARVEDILOL 12.5 MG PO TABS: 6.2500 mg | ORAL_TABLET | Freq: Two times a day (BID) | ORAL | 3 refills | Status: DC

## 2017-08-06 MED ORDER — APIXABAN 2.5 MG PO TABS: 2.5000 mg | ORAL_TABLET | Freq: Two times a day (BID) | ORAL | 6 refills | Status: DC

## 2017-08-06 MED ORDER — FUROSEMIDE 40 MG PO TABS
40.0000 mg | ORAL_TABLET | Freq: Every day | ORAL | Status: DC
  Administered 2017-08-06: 40 mg via ORAL
  Filled 2017-08-06: qty 1

## 2017-08-06 MED ORDER — FUROSEMIDE 40 MG PO TABS: 40.0000 mg | ORAL_TABLET | Freq: Every day | ORAL | 3 refills | Status: DC

## 2017-08-06 NOTE — Progress Notes (Signed)
Patient ID: Dylan [name], male   DOB: [birth year], 83 y.o.   MRN: [id number]     Advanced Heart Failure Rounding Note  Primary Cardiologist: Radford Pax HF Cardiology: Aundra Dubin  Subjective:    Maintaining NSR after DC-CV yesterday.    Feels good. Wants to go home.   TEE: EF 50%, normal RV size and systolic function, mild MR.   Objective:   Weight Range: 248 lb 1.6 oz (112.5 kg) Body mass index is 33.65 kg/m.   Vital Signs:   Temp:  [97.6 F (36.4 C)-98.3 F (36.8 C)] 97.9 F (36.6 C) (12/18 0731) Pulse Rate:  [45-95] 61 (12/18 0759) Resp:  [11-19] 18 (12/18 0731) BP: (97-125)/(44-80) 100/55 (12/18 0731) SpO2:  [90 %-99 %] 98 % (12/18 0731) Weight:  [247 lb (112 kg)-248 lb 1.6 oz (112.5 kg)] 248 lb 1.6 oz (112.5 kg) (12/18 0731) Last BM Date: 08/05/17  Weight change: Filed Weights   08/05/17 0559 08/05/17 1217 08/06/17 0731  Weight: 247 lb 3.2 oz (112.1 kg) 247 lb (112 kg) 248 lb 1.6 oz (112.5 kg)    Intake/Output:   Intake/Output Summary (Last 24 hours) at 08/06/2017 0811 Last data filed at 08/06/2017 7902 Gross per 24 hour  Intake 960 ml  Output 1200 ml  Net -240 ml      Physical Exam  General:  Well appearing. No resp difficulty HEENT: normal Neck: supple. JVP 5-6 . Carotids 2+ bilat; no bruits. No lymphadenopathy or thryomegaly appreciated. Cor: PMI nondisplaced. Regular rate & rhythm. No rubs, gallops or murmurs. Lungs: clear Abdomen: soft, nontender, nondistended. No hepatosplenomegaly. No bruits or masses. Good bowel sounds. Extremities: no cyanosis, clubbing, rash, edema Neuro: alert & orientedx3, cranial nerves grossly intact. moves all 4 extremities w/o difficulty. Affect pleasant    Telemetry   NSR 60s personally reviewed.    Labs    CBC Recent Labs    08/05/17 0457 08/06/17 0611  WBC 7.8 7.8  NEUTROABS 4.2 4.2  HGB 13.5 13.2  HCT 40.8 40.7  MCV 94.2 96.0  PLT 154 409   Basic Metabolic Panel Recent Labs    08/05/17 0457  08/06/17 0611  NA 137 135  K 4.3 5.1  CL 102 100*  CO2 27 25  GLUCOSE 108* 106*  BUN 32* 39*  CREATININE 1.66* 1.85*  CALCIUM 9.4 9.4   Liver Function Tests Recent Labs    08/03/17 1158  AST 22  ALT 17  ALKPHOS 61  BILITOT 2.1*  PROT 6.5  ALBUMIN 3.9   No results for input(s): LIPASE, AMYLASE in the last 72 hours. Cardiac Enzymes Recent Labs    08/03/17 1527 08/03/17 2052 08/04/17 0338  TROPONINI <0.03 <0.03 <0.03    BNP: BNP (last 3 results) Recent Labs    07/14/17 1601 08/03/17 1158  BNP 341.8* 318.9*    ProBNP (last 3 results) Recent Labs    08/01/17 1517  PROBNP 1,490*     D-Dimer No results for input(s): DDIMER in the last 72 hours. Hemoglobin A1C No results for input(s): HGBA1C in the last 72 hours. Fasting Lipid Panel No results for input(s): CHOL, HDL, LDLCALC, TRIG, CHOLHDL, LDLDIRECT in the last 72 hours. Thyroid Function Tests No results for input(s): TSH, T4TOTAL, T3FREE, THYROIDAB in the last 72 hours.  Invalid input(s): FREET3  Other results:   Imaging    No results found.   Medications:     Scheduled Medications: . apixaban  2.5 mg Oral BID  . carvedilol  6.25 mg Oral BID  WC  . furosemide  40 mg Oral Daily  . gabapentin  300 mg Oral BID  . insulin aspart  0-15 Units Subcutaneous TID WC  . simvastatin  40 mg Oral q1800  . sodium chloride flush  3 mL Intravenous Q12H  . sodium chloride flush  3 mL Intravenous Q12H    Infusions: . sodium chloride    . sodium chloride      PRN Medications: sodium chloride, ondansetron (ZOFRAN) IV, sodium chloride flush, sodium chloride flush    Patient Profile   83 yo with history of DM and HTN as well as recent onset atrial fibrillation and CHF seen today for evaluation of CHF.    Assessment/Plan   1. Acute on chronic diastolic CHF: Echo 19/14 with EF 50-55%.  Main complaint bringing him to the ER is dyspnea.  He was profoundly short of breath after ambulation the day of  admission.  I suspect that the proximate trigger for CHF exacerbation is atrial fibrillation.  He was found to be in atrial fibrillation about 2 wks ago and has been short of breath and on escalating Lasix doses since that time.   - Off lasix. Creatinine up to 1.85. Hold lasix x2 days then restart.  2. CKD: Stage 3.  Creatinine up to 1.8. Hold lasix for 2 days.   3. Atrial fibrillation: Rate is controlled on Coreg but he has had symptomatic diastolic CHF since diagnosis of atrial fibrillation was made.  He has been on Eliquis for about 2 wks.  - Continue eliquis 2.5 mg twice a day.  - S/P TEE-guided DCCV with conversion to NSR.   - Continue Coreg for rate control but have decreased dose to 6.25 mg bid with soft BP.  4. Diabetes: Hold metformin for now and treat with sliding scale.  5. HTN: Holding lisinopril and terazosin with soft BP. Continue to hold for now.   Home today. HF follow up in 7-10 days.   Length of Stay: 3  Amy Clegg, NP  08/06/2017, 8:11 AM  Advanced Heart Failure Team Pager [phone] (M-F; 7a - 4p)  Please contact [facility] for night-coverage after hours (4p -7a ) and weekends on [email or web address]  Patient seen with NP, agree with the above note.  He can go home today, maintaining NSR and feeling better.    Followup CHF clinic.   Meds for home:  Lasix 40 mg daily (start on 12/20) Eliquis 2.5 mg bid Coreg 6.25 mg bid Simvastatin 40 daily Stop lisinopril and terazosin for now.   Loralie Champagne 08/06/2017 8:34 AM

## 2017-08-06 NOTE — Discharge Summary (Signed)
Advanced Heart Failure Team  Discharge Summary   Patient ID: Dylan [name] MRN: [id number], DOB/AGE: Dec 01, 1925 83 y.o. Admit date: 08/03/2017 D/C date:     08/06/2017   Primary Discharge Diagnoses:  1. A/C Diastolic Heart Failure 2. CKD Stage III 3. PAF 4. Diabetes 5. HTN  Hospital Course: Dylan [name] is an 83 yo with history of DM and HTN as well as recent onset atrial fibrillation and CHF admitted with volume overload and A fib. Diuresed with IV lasix and once stable he underwent TEE/DC-CV.  Sinus Rhythm restored. Continue carvedilol 6.25 mg twice a day and eliquis 2.5 mg twice a day. Creatinine trended up above baseline so lasix was held and will be restarted at 40 mg daily on 08/09/2017.He will remain on lisinopril and hytrin for now and consider adjusting as an outpatient. He will continue to be followed closely in the HF clinic with Dr Aundra Dubin. Plan to check BMET at his follow up appointment.   1. Acute on chronic diastolic CHF: Echo 46/96 with EF 50-55%. Main complaint bringing him to the ER is dyspnea. He was profoundly short of breath after ambulation the day of admission. He was found to be in atrial fibrillation about 2 wks ago and has been short of breath and on escalating Lasix doses since that time.  - Volume status improved. Creatinine bumped so lasix will be held for 2 days the he will restart lasix 40 mg daily.  2. CKD: Stage 3. Creatinine up to 1.8. Hold lasix for 2 days.  Repeat BMET at his follow appointment.  3. Atrial fibrillation: Rate is controlled on Coreg but he has had symptomatic diastolic CHF since diagnosis of atrial fibrillation was made. He has been on Eliquis for about 2 wks.  - Continue eliquis 2.5 mg twice a day. Lower eliquis dose with age and elevated creatinine.   - S/P TEE-guided DCCV with conversion to NSR.   - Continue Coreg for rate control but have decreased dose to 6.25 mg bid with soft BP.  4. Diabetes: Covered with sliding scale.  5. HTN:  Holding lisinopril and terazosin with soft BP. Continue to hold for now.   Discharge Weight Range: 258 pounds.  Discharge Vitals: Blood pressure (!) 100/55, pulse 61, temperature 97.9 F (36.6 C), temperature source Oral, resp. rate 18, height 6' (1.829 m), weight 248 lb 1.6 oz (112.5 kg), SpO2 98 %.  Labs: Lab Results  Component Value Date   WBC 7.8 08/06/2017   HGB 13.2 08/06/2017   HCT 40.7 08/06/2017   MCV 96.0 08/06/2017   PLT 152 08/06/2017    Recent Labs  Lab 08/03/17 1158  08/06/17 0611  NA 135   < > 135  K 4.0   < > 5.1  CL 101   < > 100*  CO2 26   < > 25  BUN 22*   < > 39*  CREATININE 1.42*   < > 1.85*  CALCIUM 9.1   < > 9.4  PROT 6.5  --   --   BILITOT 2.1*  --   --   ALKPHOS 61  --   --   ALT 17  --   --   AST 22  --   --   GLUCOSE 78   < > 106*   < > = values in this interval not displayed.   No results found for: CHOL, HDL, LDLCALC, TRIG BNP (last 3 results) Recent Labs    07/14/17 1601 08/03/17 1158  BNP 341.8* 318.9*    ProBNP (last 3 results) Recent Labs    08/01/17 1517  PROBNP 1,490*     Diagnostic Studies/Procedures   No results found.  Discharge Medications   Allergies as of 08/06/2017      Reactions   Keflex [cephalexin] Hives   Occurred in the 1980's      Medication List    STOP taking these medications   lisinopril 10 MG tablet Commonly known as:  PRINIVIL,ZESTRIL   terazosin 1 MG capsule Commonly known as:  HYTRIN     TAKE these medications   apixaban 2.5 MG Tabs tablet Commonly known as:  ELIQUIS Take 1 tablet (2.5 mg total) by mouth 2 (two) times daily. What changed:    medication strength  how much to take   carvedilol 12.5 MG tablet Commonly known as:  COREG Take 0.5 tablets (6.25 mg total) by mouth 2 (two) times daily with a meal. What changed:  how much to take   cholecalciferol 1000 units tablet Commonly known as:  VITAMIN D Take 2,000 Units by mouth daily.   Fish Oil 1200 MG Caps Take 1,200  mg by mouth 2 (two) times daily.   furosemide 40 MG tablet Commonly known as:  LASIX Take 1 tablet (40 mg total) by mouth daily. Start taking on:  08/09/2017 What changed:    how much to take  how to take this  when to take this  additional instructions  These instructions start on 08/09/2017. If you are unsure what to do until then, ask your doctor or other care provider.   gabapentin 100 MG capsule Commonly known as:  NEURONTIN Take 100 mg by mouth 2 (two) times daily.   metFORMIN 500 MG 24 hr tablet Commonly known as:  GLUCOPHAGE-XR Take 500 mg by mouth daily with breakfast.   simvastatin 40 MG tablet Commonly known as:  ZOCOR Take 40 mg by mouth daily at 6 PM.   vitamin B-12 1000 MCG tablet Commonly known as:  CYANOCOBALAMIN Take 1,000 mcg by mouth daily.            Durable Medical Equipment  (From admission, onward)        Start     Ordered   08/05/17 1157  For home use only DME Walker rolling  Once    Comments:  Rollater / rolling walker with seat  Question:  Patient needs a walker to treat with the following condition  Answer:  CHF (congestive heart failure) ([place])   08/05/17 1157      Disposition   The patient will be discharged in stable condition to home. Discharge Instructions    (HEART FAILURE PATIENTS) Call MD:  Anytime you have any of the following symptoms: 1) 3 pound weight gain in 24 hours or 5 pounds in 1 week 2) shortness of breath, with or without a dry hacking cough 3) swelling in the hands, feet or stomach 4) if you have to sleep on extra pillows at night in order to breathe.   Complete by:  As directed    Diet - low sodium heart healthy   Complete by:  As directed    Heart Failure patients record your daily weight using the same scale at the same time of day   Complete by:  As directed    Increase activity slowly   Complete by:  As directed      Follow-up Information    Home, [facility] Follow up.   Specialty:  Home Health  Services Why:  They will do your home health care at your home Contact information: [place] [address] [phone]        Larey Dresser, MD Follow up on 08/21/2017.   Specialty:  Cardiology Why:  10:30 [id number] Contact information: [place] [address] [phone]             Duration of Discharge Encounter: Greater than 35 minutes   Signed, Amy Clegg NP-C  08/06/2017, 8:28 AM

## 2017-08-06 NOTE — Progress Notes (Signed)
Pt refusing bed alarm. Pt educated and told to call when he needs to get up.  Will continue to monitor.

## 2017-08-06 NOTE — Plan of Care (Signed)
  Clinical Measurements: Ability to maintain clinical measurements within normal limits will improve 08/06/2017 0231 - Adequate for Discharge by Tristan Schroeder, RN   Clinical Measurements: Respiratory complications will improve 08/06/2017 0231 - Adequate for Discharge by Tristan Schroeder, RN   Education: Ability to demonstrate management of disease process will improve 08/06/2017 0231 - Adequate for Discharge by Tristan Schroeder, RN

## 2017-08-06 NOTE — Progress Notes (Signed)
Discharge instructions given to patient and spouse including follow up appointment, prescriptions, diet and home care. Patient and spouse verbalizes understanding of all instructions.

## 2017-08-08 DIAGNOSIS — I4891 Unspecified atrial fibrillation: Secondary | ICD-10-CM | POA: Diagnosis not present

## 2017-08-08 DIAGNOSIS — E119 Type 2 diabetes mellitus without complications: Secondary | ICD-10-CM | POA: Diagnosis not present

## 2017-08-08 DIAGNOSIS — E7800 Pure hypercholesterolemia, unspecified: Secondary | ICD-10-CM | POA: Diagnosis not present

## 2017-08-08 DIAGNOSIS — I10 Essential (primary) hypertension: Secondary | ICD-10-CM | POA: Diagnosis not present

## 2017-08-08 DIAGNOSIS — Z09 Encounter for follow-up examination after completed treatment for conditions other than malignant neoplasm: Secondary | ICD-10-CM | POA: Diagnosis not present

## 2017-08-11 DIAGNOSIS — I5033 Acute on chronic diastolic (congestive) heart failure: Secondary | ICD-10-CM | POA: Diagnosis not present

## 2017-08-11 DIAGNOSIS — N183 Chronic kidney disease, stage 3 (moderate): Secondary | ICD-10-CM | POA: Diagnosis not present

## 2017-08-11 DIAGNOSIS — I130 Hypertensive heart and chronic kidney disease with heart failure and stage 1 through stage 4 chronic kidney disease, or unspecified chronic kidney disease: Secondary | ICD-10-CM | POA: Diagnosis not present

## 2017-08-11 DIAGNOSIS — Z7984 Long term (current) use of oral hypoglycemic drugs: Secondary | ICD-10-CM | POA: Diagnosis not present

## 2017-08-11 DIAGNOSIS — Z7901 Long term (current) use of anticoagulants: Secondary | ICD-10-CM | POA: Diagnosis not present

## 2017-08-11 DIAGNOSIS — I481 Persistent atrial fibrillation: Secondary | ICD-10-CM | POA: Diagnosis not present

## 2017-08-11 DIAGNOSIS — E119 Type 2 diabetes mellitus without complications: Secondary | ICD-10-CM | POA: Diagnosis not present

## 2017-08-11 DIAGNOSIS — Z87891 Personal history of nicotine dependence: Secondary | ICD-10-CM | POA: Diagnosis not present

## 2017-08-15 DIAGNOSIS — I130 Hypertensive heart and chronic kidney disease with heart failure and stage 1 through stage 4 chronic kidney disease, or unspecified chronic kidney disease: Secondary | ICD-10-CM | POA: Diagnosis not present

## 2017-08-15 DIAGNOSIS — Z7984 Long term (current) use of oral hypoglycemic drugs: Secondary | ICD-10-CM | POA: Diagnosis not present

## 2017-08-15 DIAGNOSIS — I481 Persistent atrial fibrillation: Secondary | ICD-10-CM | POA: Diagnosis not present

## 2017-08-15 DIAGNOSIS — E119 Type 2 diabetes mellitus without complications: Secondary | ICD-10-CM | POA: Diagnosis not present

## 2017-08-15 DIAGNOSIS — Z87891 Personal history of nicotine dependence: Secondary | ICD-10-CM | POA: Diagnosis not present

## 2017-08-15 DIAGNOSIS — N183 Chronic kidney disease, stage 3 (moderate): Secondary | ICD-10-CM | POA: Diagnosis not present

## 2017-08-15 DIAGNOSIS — Z7901 Long term (current) use of anticoagulants: Secondary | ICD-10-CM | POA: Diagnosis not present

## 2017-08-15 DIAGNOSIS — I5033 Acute on chronic diastolic (congestive) heart failure: Secondary | ICD-10-CM | POA: Diagnosis not present

## 2017-08-19 DIAGNOSIS — I481 Persistent atrial fibrillation: Secondary | ICD-10-CM | POA: Diagnosis not present

## 2017-08-19 DIAGNOSIS — Z7901 Long term (current) use of anticoagulants: Secondary | ICD-10-CM | POA: Diagnosis not present

## 2017-08-19 DIAGNOSIS — I130 Hypertensive heart and chronic kidney disease with heart failure and stage 1 through stage 4 chronic kidney disease, or unspecified chronic kidney disease: Secondary | ICD-10-CM | POA: Diagnosis not present

## 2017-08-19 DIAGNOSIS — E119 Type 2 diabetes mellitus without complications: Secondary | ICD-10-CM | POA: Diagnosis not present

## 2017-08-19 DIAGNOSIS — I5033 Acute on chronic diastolic (congestive) heart failure: Secondary | ICD-10-CM | POA: Diagnosis not present

## 2017-08-19 DIAGNOSIS — N183 Chronic kidney disease, stage 3 (moderate): Secondary | ICD-10-CM | POA: Diagnosis not present

## 2017-08-19 DIAGNOSIS — Z7984 Long term (current) use of oral hypoglycemic drugs: Secondary | ICD-10-CM | POA: Diagnosis not present

## 2017-08-19 DIAGNOSIS — Z87891 Personal history of nicotine dependence: Secondary | ICD-10-CM | POA: Diagnosis not present

## 2017-08-20 DIAGNOSIS — I5033 Acute on chronic diastolic (congestive) heart failure: Secondary | ICD-10-CM | POA: Diagnosis not present

## 2017-08-20 DIAGNOSIS — E119 Type 2 diabetes mellitus without complications: Secondary | ICD-10-CM | POA: Diagnosis not present

## 2017-08-20 DIAGNOSIS — Z7901 Long term (current) use of anticoagulants: Secondary | ICD-10-CM | POA: Diagnosis not present

## 2017-08-20 DIAGNOSIS — I481 Persistent atrial fibrillation: Secondary | ICD-10-CM | POA: Diagnosis not present

## 2017-08-20 DIAGNOSIS — I130 Hypertensive heart and chronic kidney disease with heart failure and stage 1 through stage 4 chronic kidney disease, or unspecified chronic kidney disease: Secondary | ICD-10-CM | POA: Diagnosis not present

## 2017-08-20 DIAGNOSIS — Z7984 Long term (current) use of oral hypoglycemic drugs: Secondary | ICD-10-CM | POA: Diagnosis not present

## 2017-08-20 DIAGNOSIS — N183 Chronic kidney disease, stage 3 (moderate): Secondary | ICD-10-CM | POA: Diagnosis not present

## 2017-08-20 DIAGNOSIS — Z87891 Personal history of nicotine dependence: Secondary | ICD-10-CM | POA: Diagnosis not present

## 2017-08-21 ENCOUNTER — Ambulatory Visit (HOSPITAL_COMMUNITY)
Admission: RE | Admit: 2017-08-21 | Discharge: 2017-08-21 | Disposition: A | Discharge status: Home / Self Care | Payer: Medicare HMO | Source: Ambulatory Visit | Attending: Cardiology | Admitting: Cardiology

## 2017-08-21 ENCOUNTER — Other Ambulatory Visit (HOSPITAL_COMMUNITY): Payer: Self-pay

## 2017-08-21 ENCOUNTER — Encounter (HOSPITAL_COMMUNITY): Payer: Self-pay | Admitting: Cardiology

## 2017-08-21 ENCOUNTER — Encounter (HOSPITAL_COMMUNITY): Payer: Self-pay

## 2017-08-21 VITALS — BP 100/62 | HR 91 | Wt 255.8 lb

## 2017-08-21 DIAGNOSIS — I4819 Other persistent atrial fibrillation: Secondary | ICD-10-CM

## 2017-08-21 DIAGNOSIS — Z7901 Long term (current) use of anticoagulants: Secondary | ICD-10-CM | POA: Insufficient documentation

## 2017-08-21 DIAGNOSIS — Z79899 Other long term (current) drug therapy: Secondary | ICD-10-CM | POA: Diagnosis not present

## 2017-08-21 DIAGNOSIS — E1122 Type 2 diabetes mellitus with diabetic chronic kidney disease: Secondary | ICD-10-CM | POA: Diagnosis not present

## 2017-08-21 DIAGNOSIS — I130 Hypertensive heart and chronic kidney disease with heart failure and stage 1 through stage 4 chronic kidney disease, or unspecified chronic kidney disease: Secondary | ICD-10-CM | POA: Diagnosis not present

## 2017-08-21 DIAGNOSIS — I5032 Chronic diastolic (congestive) heart failure: Secondary | ICD-10-CM

## 2017-08-21 DIAGNOSIS — I4892 Unspecified atrial flutter: Secondary | ICD-10-CM | POA: Insufficient documentation

## 2017-08-21 DIAGNOSIS — I481 Persistent atrial fibrillation: Secondary | ICD-10-CM

## 2017-08-21 DIAGNOSIS — I4439 Other atrioventricular block: Secondary | ICD-10-CM | POA: Diagnosis not present

## 2017-08-21 DIAGNOSIS — I4891 Unspecified atrial fibrillation: Secondary | ICD-10-CM | POA: Diagnosis present

## 2017-08-21 DIAGNOSIS — N183 Chronic kidney disease, stage 3 (moderate): Secondary | ICD-10-CM | POA: Insufficient documentation

## 2017-08-21 DIAGNOSIS — I5033 Acute on chronic diastolic (congestive) heart failure: Secondary | ICD-10-CM

## 2017-08-21 DIAGNOSIS — Z7984 Long term (current) use of oral hypoglycemic drugs: Secondary | ICD-10-CM | POA: Diagnosis not present

## 2017-08-21 DIAGNOSIS — Z87891 Personal history of nicotine dependence: Secondary | ICD-10-CM | POA: Insufficient documentation

## 2017-08-21 LAB — COMPREHENSIVE METABOLIC PANEL
ALBUMIN: 4 g/dL (ref 3.5–5.0)
ALT: 19 U/L (ref 17–63)
ANION GAP: 9 (ref 5–15)
AST: 25 U/L (ref 15–41)
Alkaline Phosphatase: 64 U/L (ref 38–126)
BILIRUBIN TOTAL: 1.7 mg/dL — AB (ref 0.3–1.2)
BUN: 18 mg/dL (ref 6–20)
CHLORIDE: 99 mmol/L — AB (ref 101–111)
CO2: 28 mmol/L (ref 22–32)
Calcium: 9 mg/dL (ref 8.9–10.3)
Creatinine, Ser: 1.26 mg/dL — ABNORMAL HIGH (ref 0.61–1.24)
GFR calc Af Amer: 57 mL/min — ABNORMAL LOW (ref >60)
GFR calc non Af Amer: 49 mL/min — ABNORMAL LOW (ref >60)
GLUCOSE: 87 mg/dL (ref 65–99)
POTASSIUM: 3.4 mmol/L — AB (ref 3.5–5.1)
SODIUM: 136 mmol/L (ref 135–145)
TOTAL PROTEIN: 6.9 g/dL (ref 6.5–8.1)

## 2017-08-21 LAB — CBC
HCT: 41.2 % (ref 39.0–52.0)
HEMOGLOBIN: 13.7 g/dL (ref 13.0–17.0)
MCH: 31.5 pg (ref 26.0–34.0)
MCHC: 33.3 g/dL (ref 30.0–36.0)
MCV: 94.7 fL (ref 78.0–100.0)
Platelets: 152 10*3/uL (ref 150–400)
RBC: 4.35 MIL/uL (ref 4.22–5.81)
RDW: 12.8 % (ref 11.5–15.5)
WBC: 7.1 10*3/uL (ref 4.0–10.5)

## 2017-08-21 MED ORDER — AMIODARONE HCL 200 MG PO TABS: ORAL_TABLET | ORAL | 3 refills | Status: DC

## 2017-08-21 NOTE — Progress Notes (Signed)
PCP: Dr. Maudie Mercury Cardiology: Dr. Aundra Dubin  84 y.o. with history of DM and HTN as well as atrial fibrillation and CHF presents for followup of CHF and atrial fibrillation.   Patient was doing well until 11/18 when he developed exertional dyspnea.  He went to the ER 07/14/17 and was found to be in atrial fibrillation with volume overload.  He was admitted overnight, diuresed and started on Eliquis, and discharged.  He remained in atrial fibrillation and short of breath.   He was re-hospitalized with dyspnea in 12/18.  TEE-guided DCCV to NSR was done and he was diuresed. TEE showed EF 50%.    He is now home with his wife.  He walks with a cane or walker due to balance trouble.  Weight at home has been stable. He has mild dyspnea after walking about 50 feet but can keep going.  No orthopnea/PND.  No chest pain.  He does not feel palpitations but is back in atrial fibrillation today.    ECG (personally reviewed): coarse atrial fibrillation rate 91  PMH: 1. Chronic diastolic CHF: Echo (16/10) with EF 50-55%, mild-moderate LVH, mild AI, mild MR, normal RV size and systolic function, PASP 45 mmHg.  - TEE (12/18): EF 50%, normal RV size and systolic function, mild MR.  2. Atrial fibrillation: Persistent, initially diagnosed in 11/18.  - TEE-guided DCCV in 12/18.  3. Chest pain: LHC remotely without obstructive disease.  1/16 Cardiolite with no ischemia.  4. Type II diabetes 5. HTN 6. CKD: Stage 3.   Social History   Socioeconomic History  . Marital status: Married    Spouse name: Not on file  . Number of children: Not on file  . Years of education: Not on file  . Highest education level: Not on file  Social Needs  . Financial resource strain: Not on file  . Food insecurity - worry: Not on file  . Food insecurity - inability: Not on file  . Transportation needs - medical: Not on file  . Transportation needs - non-medical: Not on file  Occupational History  . Not on file  Tobacco Use  .  Smoking status: Former Smoker    Packs/day: 1.00    Types: Cigarettes    Last attempt to quit: 01/07/1992    Years since quitting: 25.6  . Smokeless tobacco: Never Used  Substance and Sexual Activity  . Alcohol use: Yes    Alcohol/week: 1.2 oz    Types: 2 Glasses of wine per week  . Drug use: No  . Sexual activity: Yes  Other Topics Concern  . Not on file  Social History Narrative  . Not on file   Family History  Problem Relation Age of Onset  . Cancer - Other Mother   . CVA Father   . Heart attack Brother   . Heart attack Maternal Grandmother    ROS: All systems reviewed and negative except as per HPI.  Current Outpatient Medications  Medication Sig Dispense Refill  . apixaban (ELIQUIS) 2.5 MG TABS tablet Take 1 tablet (2.5 mg total) by mouth 2 (two) times daily. 60 tablet 6  . carvedilol (COREG) 12.5 MG tablet Take 0.5 tablets (6.25 mg total) by mouth 2 (two) times daily with a meal. 60 tablet 3  . cholecalciferol (VITAMIN D) 1000 UNITS tablet Take 2,000 Units by mouth daily.     . furosemide (LASIX) 40 MG tablet Take 1 tablet (40 mg total) by mouth daily. 45 tablet 3  . gabapentin (NEURONTIN)  100 MG capsule Take 300 mg by mouth daily.     . metFORMIN (GLUCOPHAGE-XR) 500 MG 24 hr tablet Take 500 mg by mouth daily with breakfast.    . Omega-3 Fatty Acids (FISH OIL) 1200 MG CAPS Take 1,200 mg by mouth 2 (two) times daily.     . simvastatin (ZOCOR) 40 MG tablet Take 40 mg by mouth daily at 6 PM.     . vitamin B-12 (CYANOCOBALAMIN) 1000 MCG tablet Take 1,000 mcg by mouth daily.    Marland Kitchen amiodarone (PACERONE) 200 MG tablet Take 200 mg (1 tab), twice a day for 10 days, then take 200 mg (1 tab) daily 40 tablet 3   No current facility-administered medications for this encounter.    BP 100/62   Pulse 91   Wt 255 lb 12.8 oz (116 kg)   SpO2 92%   BMI 34.69 kg/m  General: NAD Neck: No JVD, no thyromegaly or thyroid nodule.  Lungs: Clear to auscultation bilaterally with normal  respiratory effort. CV: Nondisplaced PMI.  Heart irregular S1/S2, no S3/S4, no murmur.  Trace ankle edema.  No carotid bruit.  Normal pedal pulses.  Abdomen: Soft, nontender, no hepatosplenomegaly, no distention.  Skin: Intact without lesions or rashes.  Neurologic: Alert and oriented x 3.  Psych: Normal affect. Extremities: No clubbing or cyanosis.  HEENT: Normal.   Assessment/Plan: 1. Atrial fibrillation: Recurrent afib s/p recent DCCV in 12/18.  Given worsening CHF when in atrial fibrillation, I think it would be reasonable to try again to get him in NSR.  - I will start him on amiodarone 200 mg bid x 1 week then 200 mg daily. We discussed side effects of amiodarone. Will check LFTs today.  - If he remains in atrial fibrillation, will arrange for DCCV.  Will not need TEE as long as he does not miss any apixaban. We discussed risks/benefits of DCCV and he agrees to proceed with this if he remains in atrial fibrillation.  - rate control with Coreg 6.25 mg bid.  2. CKD: Stage 3.  BMET today.   3. Chronic diastolic CHF: EF 83% on TEE from 12/18.  He does not look volume overloaded on exam, NYHA class II-III symptoms.  - Continue Lasix 40 mg daily.  4. HTN: BP controlled on Coreg alone.   Loralie Champagne 08/21/2017

## 2017-08-21 NOTE — Patient Instructions (Signed)
Start Amiodarone 200 mg (1 tab), twice a day for 10 days  -Then take 200 mg (1 tab) daily  Your physician has recommended that you have a Cardioversion (DCCV). Electrical Cardioversion uses a jolt of electricity to your heart either through paddles or wired patches attached to your chest. This is a controlled, usually prescheduled, procedure. Defibrillation is done under light anesthesia in the hospital, and you usually go home the day of the procedure. This is done to get your heart back into a normal rhythm. You are not awake for the procedure. Please see the instruction sheet given to you today.  Labs drawn today (if we do not call you, then your lab work was stable)   Your physician recommends that you schedule a follow-up appointment in: 4 weeks with Dr. Aundra Dubin

## 2017-08-21 NOTE — H&P (View-Only) (Signed)
PCP: Dr. Maudie Mercury Cardiology: Dr. Aundra Dubin  84 y.o. with history of DM and HTN as well as atrial fibrillation and CHF presents for followup of CHF and atrial fibrillation.   Patient was doing well until 11/18 when he developed exertional dyspnea.  He went to the ER 07/14/17 and was found to be in atrial fibrillation with volume overload.  He was admitted overnight, diuresed and started on Eliquis, and discharged.  He remained in atrial fibrillation and short of breath.   He was re-hospitalized with dyspnea in 12/18.  TEE-guided DCCV to NSR was done and he was diuresed. TEE showed EF 50%.    He is now home with his wife.  He walks with a cane or walker due to balance trouble.  Weight at home has been stable. He has mild dyspnea after walking about 50 feet but can keep going.  No orthopnea/PND.  No chest pain.  He does not feel palpitations but is back in atrial fibrillation today.    ECG (personally reviewed): coarse atrial fibrillation rate 91  PMH: 1. Chronic diastolic CHF: Echo (78/29) with EF 50-55%, mild-moderate LVH, mild AI, mild MR, normal RV size and systolic function, PASP 45 mmHg.  - TEE (12/18): EF 50%, normal RV size and systolic function, mild MR.  2. Atrial fibrillation: Persistent, initially diagnosed in 11/18.  - TEE-guided DCCV in 12/18.  3. Chest pain: LHC remotely without obstructive disease.  1/16 Cardiolite with no ischemia.  4. Type II diabetes 5. HTN 6. CKD: Stage 3.   Social History   Socioeconomic History  . Marital status: Married    Spouse name: Not on file  . Number of children: Not on file  . Years of education: Not on file  . Highest education level: Not on file  Social Needs  . Financial resource strain: Not on file  . Food insecurity - worry: Not on file  . Food insecurity - inability: Not on file  . Transportation needs - medical: Not on file  . Transportation needs - non-medical: Not on file  Occupational History  . Not on file  Tobacco Use  .  Smoking status: Former Smoker    Packs/day: 1.00    Types: Cigarettes    Last attempt to quit: 01/07/1992    Years since quitting: 25.6  . Smokeless tobacco: Never Used  Substance and Sexual Activity  . Alcohol use: Yes    Alcohol/week: 1.2 oz    Types: 2 Glasses of wine per week  . Drug use: No  . Sexual activity: Yes  Other Topics Concern  . Not on file  Social History Narrative  . Not on file   Family History  Problem Relation Age of Onset  . Cancer - Other Mother   . CVA Father   . Heart attack Brother   . Heart attack Maternal Grandmother    ROS: All systems reviewed and negative except as per HPI.  Current Outpatient Medications  Medication Sig Dispense Refill  . apixaban (ELIQUIS) 2.5 MG TABS tablet Take 1 tablet (2.5 mg total) by mouth 2 (two) times daily. 60 tablet 6  . carvedilol (COREG) 12.5 MG tablet Take 0.5 tablets (6.25 mg total) by mouth 2 (two) times daily with a meal. 60 tablet 3  . cholecalciferol (VITAMIN D) 1000 UNITS tablet Take 2,000 Units by mouth daily.     . furosemide (LASIX) 40 MG tablet Take 1 tablet (40 mg total) by mouth daily. 45 tablet 3  . gabapentin (NEURONTIN)  100 MG capsule Take 300 mg by mouth daily.     . metFORMIN (GLUCOPHAGE-XR) 500 MG 24 hr tablet Take 500 mg by mouth daily with breakfast.    . Omega-3 Fatty Acids (FISH OIL) 1200 MG CAPS Take 1,200 mg by mouth 2 (two) times daily.     . simvastatin (ZOCOR) 40 MG tablet Take 40 mg by mouth daily at 6 PM.     . vitamin B-12 (CYANOCOBALAMIN) 1000 MCG tablet Take 1,000 mcg by mouth daily.    Marland Kitchen amiodarone (PACERONE) 200 MG tablet Take 200 mg (1 tab), twice a day for 10 days, then take 200 mg (1 tab) daily 40 tablet 3   No current facility-administered medications for this encounter.    BP 100/62   Pulse 91   Wt 255 lb 12.8 oz (116 kg)   SpO2 92%   BMI 34.69 kg/m  General: NAD Neck: No JVD, no thyromegaly or thyroid nodule.  Lungs: Clear to auscultation bilaterally with normal  respiratory effort. CV: Nondisplaced PMI.  Heart irregular S1/S2, no S3/S4, no murmur.  Trace ankle edema.  No carotid bruit.  Normal pedal pulses.  Abdomen: Soft, nontender, no hepatosplenomegaly, no distention.  Skin: Intact without lesions or rashes.  Neurologic: Alert and oriented x 3.  Psych: Normal affect. Extremities: No clubbing or cyanosis.  HEENT: Normal.   Assessment/Plan: 1. Atrial fibrillation: Recurrent afib s/p recent DCCV in 12/18.  Given worsening CHF when in atrial fibrillation, I think it would be reasonable to try again to get him in NSR.  - I will start him on amiodarone 200 mg bid x 1 week then 200 mg daily. We discussed side effects of amiodarone. Will check LFTs today.  - If he remains in atrial fibrillation, will arrange for DCCV.  Will not need TEE as long as he does not miss any apixaban. We discussed risks/benefits of DCCV and he agrees to proceed with this if he remains in atrial fibrillation.  - rate control with Coreg 6.25 mg bid.  2. CKD: Stage 3.  BMET today.   3. Chronic diastolic CHF: EF 53% on TEE from 12/18.  He does not look volume overloaded on exam, NYHA class II-III symptoms.  - Continue Lasix 40 mg daily.  4. HTN: BP controlled on Coreg alone.   Loralie Champagne 08/21/2017

## 2017-08-23 DIAGNOSIS — Z7901 Long term (current) use of anticoagulants: Secondary | ICD-10-CM | POA: Diagnosis not present

## 2017-08-23 DIAGNOSIS — N183 Chronic kidney disease, stage 3 (moderate): Secondary | ICD-10-CM | POA: Diagnosis not present

## 2017-08-23 DIAGNOSIS — Z87891 Personal history of nicotine dependence: Secondary | ICD-10-CM | POA: Diagnosis not present

## 2017-08-23 DIAGNOSIS — I481 Persistent atrial fibrillation: Secondary | ICD-10-CM | POA: Diagnosis not present

## 2017-08-23 DIAGNOSIS — I130 Hypertensive heart and chronic kidney disease with heart failure and stage 1 through stage 4 chronic kidney disease, or unspecified chronic kidney disease: Secondary | ICD-10-CM | POA: Diagnosis not present

## 2017-08-23 DIAGNOSIS — Z7984 Long term (current) use of oral hypoglycemic drugs: Secondary | ICD-10-CM | POA: Diagnosis not present

## 2017-08-23 DIAGNOSIS — E119 Type 2 diabetes mellitus without complications: Secondary | ICD-10-CM | POA: Diagnosis not present

## 2017-08-23 DIAGNOSIS — I5033 Acute on chronic diastolic (congestive) heart failure: Secondary | ICD-10-CM | POA: Diagnosis not present

## 2017-08-26 ENCOUNTER — Other Ambulatory Visit (HOSPITAL_COMMUNITY): Payer: Self-pay

## 2017-08-26 ENCOUNTER — Other Ambulatory Visit (HOSPITAL_COMMUNITY): Payer: Self-pay | Admitting: *Deleted

## 2017-08-26 MED ORDER — APIXABAN 5 MG PO TABS: 5.0000 mg | ORAL_TABLET | Freq: Two times a day (BID) | ORAL | 3 refills | Status: DC

## 2017-08-27 ENCOUNTER — Encounter (HOSPITAL_COMMUNITY): Payer: Medicare HMO | Admitting: Cardiology

## 2017-08-27 DIAGNOSIS — Z7984 Long term (current) use of oral hypoglycemic drugs: Secondary | ICD-10-CM | POA: Diagnosis not present

## 2017-08-27 DIAGNOSIS — E119 Type 2 diabetes mellitus without complications: Secondary | ICD-10-CM | POA: Diagnosis not present

## 2017-08-27 DIAGNOSIS — Z87891 Personal history of nicotine dependence: Secondary | ICD-10-CM | POA: Diagnosis not present

## 2017-08-27 DIAGNOSIS — I130 Hypertensive heart and chronic kidney disease with heart failure and stage 1 through stage 4 chronic kidney disease, or unspecified chronic kidney disease: Secondary | ICD-10-CM | POA: Diagnosis not present

## 2017-08-27 DIAGNOSIS — I5033 Acute on chronic diastolic (congestive) heart failure: Secondary | ICD-10-CM | POA: Diagnosis not present

## 2017-08-27 DIAGNOSIS — N183 Chronic kidney disease, stage 3 (moderate): Secondary | ICD-10-CM | POA: Diagnosis not present

## 2017-08-27 DIAGNOSIS — I481 Persistent atrial fibrillation: Secondary | ICD-10-CM | POA: Diagnosis not present

## 2017-08-27 DIAGNOSIS — Z7901 Long term (current) use of anticoagulants: Secondary | ICD-10-CM | POA: Diagnosis not present

## 2017-08-28 ENCOUNTER — Telehealth (HOSPITAL_COMMUNITY): Payer: Self-pay

## 2017-08-28 NOTE — Telephone Encounter (Signed)
Pt was aware and adhering to change.   Are one of y'all working on this?   Previous Messages    ----- Message -----  From: Larey Dresser, MD  Sent: 08/21/2017 10:11 PM  To: Adora Fridge, RPH-CPP, *   Please have him increase Eliquis to 5 mg bid based on improved creatinine.

## 2017-09-02 ENCOUNTER — Ambulatory Visit: Payer: Medicare HMO | Admitting: Physician Assistant

## 2017-09-03 DIAGNOSIS — Z87891 Personal history of nicotine dependence: Secondary | ICD-10-CM | POA: Diagnosis not present

## 2017-09-03 DIAGNOSIS — Z7984 Long term (current) use of oral hypoglycemic drugs: Secondary | ICD-10-CM | POA: Diagnosis not present

## 2017-09-03 DIAGNOSIS — Z7901 Long term (current) use of anticoagulants: Secondary | ICD-10-CM | POA: Diagnosis not present

## 2017-09-03 DIAGNOSIS — E119 Type 2 diabetes mellitus without complications: Secondary | ICD-10-CM | POA: Diagnosis not present

## 2017-09-03 DIAGNOSIS — I5033 Acute on chronic diastolic (congestive) heart failure: Secondary | ICD-10-CM | POA: Diagnosis not present

## 2017-09-03 DIAGNOSIS — N183 Chronic kidney disease, stage 3 (moderate): Secondary | ICD-10-CM | POA: Diagnosis not present

## 2017-09-03 DIAGNOSIS — I481 Persistent atrial fibrillation: Secondary | ICD-10-CM | POA: Diagnosis not present

## 2017-09-03 DIAGNOSIS — I130 Hypertensive heart and chronic kidney disease with heart failure and stage 1 through stage 4 chronic kidney disease, or unspecified chronic kidney disease: Secondary | ICD-10-CM | POA: Diagnosis not present

## 2017-09-12 ENCOUNTER — Ambulatory Visit (HOSPITAL_COMMUNITY)
Admission: RE | Admit: 2017-09-12 | Discharge: 2017-09-12 | Disposition: A | Discharge status: Home / Self Care | Payer: Medicare HMO | Source: Ambulatory Visit | Attending: Cardiology | Admitting: Cardiology

## 2017-09-12 ENCOUNTER — Encounter (HOSPITAL_COMMUNITY): Payer: Self-pay

## 2017-09-12 ENCOUNTER — Ambulatory Visit (HOSPITAL_BASED_OUTPATIENT_CLINIC_OR_DEPARTMENT_OTHER): Payer: Medicare HMO

## 2017-09-12 ENCOUNTER — Other Ambulatory Visit: Payer: Self-pay

## 2017-09-12 ENCOUNTER — Ambulatory Visit (HOSPITAL_COMMUNITY): Payer: Medicare HMO | Admitting: Anesthesiology

## 2017-09-12 ENCOUNTER — Encounter (HOSPITAL_COMMUNITY)
Admission: RE | Disposition: A | Discharge status: Home / Self Care | Payer: Self-pay | Source: Ambulatory Visit | Attending: Cardiology

## 2017-09-12 DIAGNOSIS — Z6833 Body mass index (BMI) 33.0-33.9, adult: Secondary | ICD-10-CM | POA: Diagnosis not present

## 2017-09-12 DIAGNOSIS — Z7984 Long term (current) use of oral hypoglycemic drugs: Secondary | ICD-10-CM | POA: Diagnosis not present

## 2017-09-12 DIAGNOSIS — I4891 Unspecified atrial fibrillation: Secondary | ICD-10-CM | POA: Diagnosis not present

## 2017-09-12 DIAGNOSIS — I340 Nonrheumatic mitral (valve) insufficiency: Secondary | ICD-10-CM | POA: Diagnosis not present

## 2017-09-12 DIAGNOSIS — Z79899 Other long term (current) drug therapy: Secondary | ICD-10-CM | POA: Diagnosis not present

## 2017-09-12 DIAGNOSIS — N183 Chronic kidney disease, stage 3 (moderate): Secondary | ICD-10-CM | POA: Diagnosis not present

## 2017-09-12 DIAGNOSIS — I130 Hypertensive heart and chronic kidney disease with heart failure and stage 1 through stage 4 chronic kidney disease, or unspecified chronic kidney disease: Secondary | ICD-10-CM | POA: Diagnosis not present

## 2017-09-12 DIAGNOSIS — Z87891 Personal history of nicotine dependence: Secondary | ICD-10-CM | POA: Diagnosis not present

## 2017-09-12 DIAGNOSIS — Z791 Long term (current) use of non-steroidal anti-inflammatories (NSAID): Secondary | ICD-10-CM | POA: Diagnosis not present

## 2017-09-12 DIAGNOSIS — I110 Hypertensive heart disease with heart failure: Secondary | ICD-10-CM | POA: Diagnosis not present

## 2017-09-12 DIAGNOSIS — I080 Rheumatic disorders of both mitral and aortic valves: Secondary | ICD-10-CM | POA: Diagnosis not present

## 2017-09-12 DIAGNOSIS — I5033 Acute on chronic diastolic (congestive) heart failure: Secondary | ICD-10-CM | POA: Diagnosis not present

## 2017-09-12 DIAGNOSIS — I5032 Chronic diastolic (congestive) heart failure: Secondary | ICD-10-CM | POA: Diagnosis not present

## 2017-09-12 DIAGNOSIS — E1122 Type 2 diabetes mellitus with diabetic chronic kidney disease: Secondary | ICD-10-CM | POA: Insufficient documentation

## 2017-09-12 HISTORY — PX: CARDIOVERSION: SHX1299

## 2017-09-12 HISTORY — PX: TEE WITHOUT CARDIOVERSION: SHX5443

## 2017-09-12 LAB — POCT I-STAT, CHEM 8
BUN: 14 mg/dL (ref 6–20)
CREATININE: 1.3 mg/dL — AB (ref 0.61–1.24)
Calcium, Ion: 1.11 mmol/L — ABNORMAL LOW (ref 1.15–1.40)
Chloride: 100 mmol/L — ABNORMAL LOW (ref 101–111)
Glucose, Bld: 101 mg/dL — ABNORMAL HIGH (ref 65–99)
HEMATOCRIT: 40 % (ref 39.0–52.0)
HEMOGLOBIN: 13.6 g/dL (ref 13.0–17.0)
POTASSIUM: 3.8 mmol/L (ref 3.5–5.1)
SODIUM: 142 mmol/L (ref 135–145)
TCO2: 26 mmol/L (ref 22–32)

## 2017-09-12 SURGERY — CARDIOVERSION
Anesthesia: General

## 2017-09-12 MED ORDER — BUTAMBEN-TETRACAINE-BENZOCAINE 2-2-14 % EX AERO
INHALATION_SPRAY | CUTANEOUS | Status: DC | PRN
  Administered 2017-09-12: 2 via TOPICAL

## 2017-09-12 MED ORDER — PROPOFOL 10 MG/ML IV BOLUS
INTRAVENOUS | Status: DC | PRN
  Administered 2017-09-12: 20 mg via INTRAVENOUS

## 2017-09-12 MED ORDER — LIDOCAINE HCL (CARDIAC) 20 MG/ML IV SOLN
INTRAVENOUS | Status: DC | PRN
  Administered 2017-09-12: 30 mg via INTRAVENOUS

## 2017-09-12 MED ORDER — AMIODARONE HCL 200 MG PO TABS: 200.0000 mg | ORAL_TABLET | Freq: Every day | ORAL | 6 refills | Status: DC

## 2017-09-12 MED ORDER — SODIUM CHLORIDE 0.9 % IV SOLN
INTRAVENOUS | Status: DC
  Administered 2017-09-12 (×2): via INTRAVENOUS

## 2017-09-12 MED ORDER — PROPOFOL 500 MG/50ML IV EMUL
INTRAVENOUS | Status: DC | PRN
  Administered 2017-09-12: 75 ug/kg/min via INTRAVENOUS

## 2017-09-12 NOTE — Progress Notes (Signed)
  Echocardiogram Echocardiogram Transesophageal has been performed.  Georgeanna Radziewicz T Caprice Mccaffrey 09/12/2017, 1:21 PM

## 2017-09-12 NOTE — Anesthesia Postprocedure Evaluation (Signed)
Anesthesia Post Note  Patient: HEZAKIAH CHAMPEAU  Procedure(s) Performed: CARDIOVERSION (N/A ) TRANSESOPHAGEAL ECHOCARDIOGRAM (TEE) (N/A )     Patient location during evaluation: PACU Anesthesia Type: General Level of consciousness: awake and alert Pain management: pain level controlled Vital Signs Assessment: post-procedure vital signs reviewed and stable Respiratory status: spontaneous breathing, nonlabored ventilation, respiratory function stable and patient connected to nasal cannula oxygen Cardiovascular status: blood pressure returned to baseline and stable Postop Assessment: no apparent nausea or vomiting Anesthetic complications: no    Last Vitals:  Vitals:   09/12/17 1326 09/12/17 1333  BP:  125/84  Pulse: (!) 56 (!) 55  Resp:  11  Temp:  36.4 C  SpO2: 93% 93%    Last Pain:  Vitals:   09/12/17 1333  TempSrc: Oral                 Channing Savich

## 2017-09-12 NOTE — Interval H&P Note (Signed)
History and Physical Interval Note:  09/12/2017 12:58 PM  Dylan [name]  has presented today for surgery, with the diagnosis of AFIB  The various methods of treatment have been discussed with the patient and family. After consideration of risks, benefits and other options for treatment, the patient has consented to  Procedure(s): CARDIOVERSION (N/A) TRANSESOPHAGEAL ECHOCARDIOGRAM (TEE) (N/A) as a surgical intervention .  The patient's history has been reviewed, patient examined, no change in status, stable for surgery.  I have reviewed the patient's chart and labs.  Questions were answered to the patient's satisfaction.     Twanda Stakes [employer]

## 2017-09-12 NOTE — Discharge Instructions (Signed)
TEE ° °YOU HAD AN CARDIAC PROCEDURE TODAY: Refer to the procedure report and other information in the discharge instructions given to you for any specific questions about what was found during the examination. If this information does not answer your questions, please call [facility] office at [phone] to clarify.  ° °DIET: Your first meal following the procedure should be a light meal and then it is ok to progress to your normal diet. A half-sandwich or bowl of soup is an example of a good first meal. Heavy or fried foods are harder to digest and may make you feel nauseous or bloated. Drink plenty of fluids but you should avoid alcoholic beverages for 24 hours. If you had a esophageal dilation, please see attached instructions for diet.  ° °ACTIVITY: Your care partner should take you home directly after the procedure. You should plan to take it easy, moving slowly for the rest of the day. You can resume normal activity the day after the procedure however YOU SHOULD NOT DRIVE, use power tools, machinery or perform tasks that involve climbing or major physical exertion for 24 hours (because of the sedation medicines used during the test).  ° °SYMPTOMS TO REPORT IMMEDIATELY: °A cardiologist can be reached at any hour. Please call [phone] for any of the following symptoms:  °Vomiting of blood or coffee ground material  °New, significant abdominal pain  °New, significant chest pain or pain under the shoulder blades  °Painful or persistently difficult swallowing  °New shortness of breath  °Black, tarry-looking or red, bloody stools ° °FOLLOW UP:  °Please also call with any specific questions about appointments or follow up tests. ° °Electrical Cardioversion, Care After °This sheet gives you information about how to care for yourself after your procedure. Your health care provider may also give you more specific instructions. If you have problems or questions, contact your health care provider. °What can I  expect after the procedure? °After the procedure, it is common to have: °· Some redness on the skin where the shocks were given. ° °Follow these instructions at home: °· Do not drive for 24 hours if you were given a medicine to help you relax (sedative). °· Take over-the-counter and prescription medicines only as told by your health care provider. °· Ask your health care provider how to check your pulse. Check it often. °· Rest for 48 hours after the procedure or as told by your health care provider. °· Avoid or limit your caffeine use as told by your health care provider. °Contact a health care provider if: °· You feel like your heart is beating too quickly or your pulse is not regular. °· You have a serious muscle cramp that does not go away. °Get help right away if: °· You have discomfort in your chest. °· You are dizzy or you feel faint. °· You have trouble breathing or you are short of breath. °· Your speech is slurred. °· You have trouble moving an arm or leg on one side of your body. °· Your fingers or toes turn cold or blue. °This information is not intended to replace advice given to you by your health care provider. Make sure you discuss any questions you have with your health care provider. °Document Released: 05/27/2013 Document Revised: 03/09/2016 Document Reviewed: 02/10/2016 °Elsevier Interactive Patient Education © 2018 [employer] ° °

## 2017-09-12 NOTE — CV Procedure (Signed)
Procedure: TEE  Indication: Atrial fibrillation.   Sedation: Per anesthesiology.  Findings: Please see echo section for full report.  Normal LV size with mild LV hypertrophy.  EF 55-60%.  Normal wall motion.  Normal RV size and systolic function.  Mild left atrial enlargement, no LA appendage thrombus.  Normal right atrium.  No PFO/ASD, negative bubble study.  Trivial TR.  Mild mitral regurgitation.  Moderately calcified trileaflet aortic valve with trivial regurgitation.  Sclerosis without significant stenosis.  Grade 3 aortic plaque, normal caliber thoracic aorta.    No LA thrombus, proceed to DCCV.  Loralie Champagne 09/12/2017 1:21 PM

## 2017-09-12 NOTE — Procedures (Addendum)
Electrical Cardioversion Procedure Note BRYSAN MCEVOY [id number] 08/05/1926  Procedure: Electrical Cardioversion Indications:  Atrial Fibrillation  Procedure Details Consent: Risks of procedure as well as the alternatives and risks of each were explained to the (patient/caregiver).  Consent for procedure obtained. Time Out: Verified patient identification, verified procedure, site/side was marked, verified correct patient position, special equipment/implants available, medications/allergies/relevent history reviewed, required imaging and test results available.  Performed  Patient placed on cardiac monitor, pulse oximetry, supplemental oxygen as necessary.  Sedation given: Propofol per anesthesiology Pacer pads placed anterior and posterior chest.  Cardioverted 1 time(s).  Cardioverted at [place].  Evaluation Findings: Post procedure EKG shows: NSR Complications: None Patient did tolerate procedure well.   Loralie Champagne 09/12/2017, 1:21 PM

## 2017-09-12 NOTE — Transfer of Care (Signed)
Immediate Anesthesia Transfer of Care Note  Patient: Baruch Merl  Procedure(s) Performed: CARDIOVERSION (N/A ) TRANSESOPHAGEAL ECHOCARDIOGRAM (TEE) (N/A )  Patient Location: Endoscopy Unit  Anesthesia Type:General  Level of Consciousness: awake and alert   Airway & Oxygen Therapy: Patient Spontanous Breathing and Patient connected to nasal cannula oxygen  Post-op Assessment: Report given to RN and Post -op Vital signs reviewed and stable  Post vital signs: Reviewed and stable  Last Vitals:  Vitals:   09/12/17 1325 09/12/17 1326  BP:    Pulse: (!) 55 (!) 56  Resp: (!) 0   Temp:    SpO2: (!) 89% 93%    Last Pain:  Vitals:   09/12/17 1143  TempSrc: Oral         Complications: No apparent anesthesia complications

## 2017-09-12 NOTE — Anesthesia Preprocedure Evaluation (Addendum)
Anesthesia Evaluation  Patient identified by MRN, date of birth, ID band Patient awake    Reviewed: Allergy & Precautions, NPO status , Patient's Chart, lab work & pertinent test results  History of Anesthesia Complications Negative for: history of anesthetic complications  Airway Mallampati: II  TM Distance: >3 FB Neck ROM: Full    Dental no notable dental hx. (+) Edentulous Upper, Upper Dentures   Pulmonary neg pulmonary ROS, former smoker,    Pulmonary exam normal breath sounds clear to auscultation       Cardiovascular hypertension, +CHF and + DOE  Normal cardiovascular exam+ dysrhythmias  Rhythm:Irregular Rate:Tachycardia     Neuro/Psych negative neurological ROS     GI/Hepatic negative GI ROS, Neg liver ROS,   Endo/Other  diabetesMorbid obesity  Renal/GU      Musculoskeletal   Abdominal (+) + obese,   Peds  Hematology   Anesthesia Other Findings   Reproductive/Obstetrics                            Anesthesia Physical  Anesthesia Plan  ASA: III  Anesthesia Plan: General   Post-op Pain Management:    Induction: Intravenous  PONV Risk Score and Plan: 2 and Treatment may vary due to age or medical condition  Airway Management Planned: Mask, Nasal Cannula and Natural Airway  Additional Equipment:   Intra-op Plan:   Post-operative Plan:   Informed Consent: I have reviewed the patients History and Physical, chart, labs and discussed the procedure including the risks, benefits and alternatives for the proposed anesthesia with the patient or authorized representative who has indicated his/her understanding and acceptance.   Dental advisory given  Plan Discussed with: CRNA and Anesthesiologist  Anesthesia Plan Comments:         Anesthesia Quick Evaluation

## 2017-09-13 DIAGNOSIS — E1151 Type 2 diabetes mellitus with diabetic peripheral angiopathy without gangrene: Secondary | ICD-10-CM | POA: Diagnosis not present

## 2017-09-13 DIAGNOSIS — I10 Essential (primary) hypertension: Secondary | ICD-10-CM | POA: Diagnosis not present

## 2017-09-13 DIAGNOSIS — I480 Paroxysmal atrial fibrillation: Secondary | ICD-10-CM | POA: Diagnosis not present

## 2017-09-13 DIAGNOSIS — E7800 Pure hypercholesterolemia, unspecified: Secondary | ICD-10-CM | POA: Diagnosis not present

## 2017-09-13 DIAGNOSIS — M1611 Unilateral primary osteoarthritis, right hip: Secondary | ICD-10-CM | POA: Diagnosis not present

## 2017-09-13 DIAGNOSIS — Z6837 Body mass index (BMI) 37.0-37.9, adult: Secondary | ICD-10-CM | POA: Diagnosis not present

## 2017-09-13 DIAGNOSIS — M1612 Unilateral primary osteoarthritis, left hip: Secondary | ICD-10-CM | POA: Diagnosis not present

## 2017-09-15 ENCOUNTER — Encounter (HOSPITAL_COMMUNITY): Payer: Self-pay | Admitting: Cardiology

## 2017-09-17 ENCOUNTER — Other Ambulatory Visit: Payer: Self-pay

## 2017-09-17 ENCOUNTER — Encounter (HOSPITAL_COMMUNITY): Payer: Self-pay | Admitting: Cardiology

## 2017-09-17 ENCOUNTER — Ambulatory Visit (HOSPITAL_COMMUNITY)
Admission: RE | Admit: 2017-09-17 | Discharge: 2017-09-17 | Disposition: A | Discharge status: Home / Self Care | Payer: Medicare HMO | Source: Ambulatory Visit | Attending: Cardiology | Admitting: Cardiology

## 2017-09-17 VITALS — BP 114/52 | HR 54 | Wt 256.0 lb

## 2017-09-17 DIAGNOSIS — I5032 Chronic diastolic (congestive) heart failure: Secondary | ICD-10-CM

## 2017-09-17 DIAGNOSIS — Z823 Family history of stroke: Secondary | ICD-10-CM | POA: Insufficient documentation

## 2017-09-17 DIAGNOSIS — I480 Paroxysmal atrial fibrillation: Secondary | ICD-10-CM

## 2017-09-17 DIAGNOSIS — Z7984 Long term (current) use of oral hypoglycemic drugs: Secondary | ICD-10-CM | POA: Diagnosis not present

## 2017-09-17 DIAGNOSIS — Z7902 Long term (current) use of antithrombotics/antiplatelets: Secondary | ICD-10-CM | POA: Insufficient documentation

## 2017-09-17 DIAGNOSIS — I130 Hypertensive heart and chronic kidney disease with heart failure and stage 1 through stage 4 chronic kidney disease, or unspecified chronic kidney disease: Secondary | ICD-10-CM | POA: Diagnosis not present

## 2017-09-17 DIAGNOSIS — Z87891 Personal history of nicotine dependence: Secondary | ICD-10-CM | POA: Diagnosis not present

## 2017-09-17 DIAGNOSIS — Z79899 Other long term (current) drug therapy: Secondary | ICD-10-CM | POA: Insufficient documentation

## 2017-09-17 DIAGNOSIS — E1122 Type 2 diabetes mellitus with diabetic chronic kidney disease: Secondary | ICD-10-CM | POA: Diagnosis not present

## 2017-09-17 DIAGNOSIS — Z8249 Family history of ischemic heart disease and other diseases of the circulatory system: Secondary | ICD-10-CM | POA: Diagnosis not present

## 2017-09-17 DIAGNOSIS — I4891 Unspecified atrial fibrillation: Secondary | ICD-10-CM | POA: Insufficient documentation

## 2017-09-17 DIAGNOSIS — N183 Chronic kidney disease, stage 3 (moderate): Secondary | ICD-10-CM | POA: Insufficient documentation

## 2017-09-17 DIAGNOSIS — Z809 Family history of malignant neoplasm, unspecified: Secondary | ICD-10-CM | POA: Insufficient documentation

## 2017-09-17 DIAGNOSIS — Z9889 Other specified postprocedural states: Secondary | ICD-10-CM | POA: Insufficient documentation

## 2017-09-17 LAB — CBC
HCT: 41.4 % (ref 39.0–52.0)
Hemoglobin: 13.6 g/dL (ref 13.0–17.0)
MCH: 31.3 pg (ref 26.0–34.0)
MCHC: 32.9 g/dL (ref 30.0–36.0)
MCV: 95.4 fL (ref 78.0–100.0)
Platelets: 135 10*3/uL — ABNORMAL LOW (ref 150–400)
RBC: 4.34 MIL/uL (ref 4.22–5.81)
RDW: 13.3 % (ref 11.5–15.5)
WBC: 6.4 10*3/uL (ref 4.0–10.5)

## 2017-09-17 LAB — COMPREHENSIVE METABOLIC PANEL
ALT: 15 U/L — ABNORMAL LOW (ref 17–63)
ANION GAP: 12 (ref 5–15)
AST: 25 U/L (ref 15–41)
Albumin: 4 g/dL (ref 3.5–5.0)
Alkaline Phosphatase: 62 U/L (ref 38–126)
BUN: 14 mg/dL (ref 6–20)
CHLORIDE: 101 mmol/L (ref 101–111)
CO2: 25 mmol/L (ref 22–32)
CREATININE: 1.31 mg/dL — AB (ref 0.61–1.24)
Calcium: 9.3 mg/dL (ref 8.9–10.3)
GFR, EST AFRICAN AMERICAN: 54 mL/min — AB (ref >60)
GFR, EST NON AFRICAN AMERICAN: 47 mL/min — AB (ref >60)
Glucose, Bld: 126 mg/dL — ABNORMAL HIGH (ref 65–99)
POTASSIUM: 3.9 mmol/L (ref 3.5–5.1)
Sodium: 138 mmol/L (ref 135–145)
Total Bilirubin: 1.5 mg/dL — ABNORMAL HIGH (ref 0.3–1.2)
Total Protein: 6.6 g/dL (ref 6.5–8.1)

## 2017-09-17 LAB — TSH: TSH: 3.442 u[IU]/mL (ref 0.350–4.500)

## 2017-09-17 MED ORDER — CARVEDILOL 3.125 MG PO TABS: 3.1250 mg | ORAL_TABLET | Freq: Two times a day (BID) | ORAL | 3 refills | Status: DC

## 2017-09-17 NOTE — Patient Instructions (Signed)
Decrease Carvedilol 3.125 mg (1 tab), twice a day  Labs drawn today (if we do not call you, then your lab work was stable)   Your physician recommends that you schedule a follow-up appointment in: 3 months with Dr. Aundra Dubin

## 2017-09-17 NOTE — Progress Notes (Signed)
PCP: Dr. Maudie Mercury Cardiology: Dr. Aundra Dubin  84 y.o. with history of DM and HTN as well as atrial fibrillation and CHF presents for followup of CHF and atrial fibrillation.   Patient was doing well until 11/18 when he developed exertional dyspnea.  He went to the ER 07/14/17 and was found to be in atrial fibrillation with volume overload.  He was admitted overnight, diuresed and started on Eliquis, and discharged.  He remained in atrial fibrillation and short of breath.   He was re-hospitalized with dyspnea in 12/18.  TEE-guided DCCV to NSR was done and he was diuresed. TEE showed EF 50%.    He was noted to be back in atrial fibrillation and was started on amiodarone.  TEE-guided DCCV in 1/19 was done, with conversion back to NSR.   He returns today for followup of atrial fibrillation and diastolic HF.  He remains in NSR today.  No tachypalpitations.  He is feeling good, no dyspnea with normal activities.  Weight stable.  He can walk around in stores without problems.  No chest pain. HR readings from home show some readings in the 40s.    ECG (personally reviewed): sinus bradycardia at 52, 1st degree AVB.   PMH: 1. Chronic diastolic CHF: Echo (06/30) with EF 50-55%, mild-moderate LVH, mild AI, mild MR, normal RV size and systolic function, PASP 45 mmHg.  - TEE (12/18): EF 50%, normal RV size and systolic function, mild MR.  - TEE (1/19): EF 55-60%, mild LVH, mild MR, normal RV, aortic sclerosis without significant stenosis.  2. Atrial fibrillation: Persistent, initially diagnosed in 11/18.  - TEE-guided DCCV in 12/18.  - TEE-guided DCCV in 1/19 on amiodarone.  3. Chest pain: LHC remotely without obstructive disease.  1/16 Cardiolite with no ischemia.  4. Type II diabetes 5. HTN 6. CKD: Stage 3.   Social History   Socioeconomic History  . Marital status: Married    Spouse name: Not on file  . Number of children: Not on file  . Years of education: Not on file  . Highest education level: Not  on file  Social Needs  . Financial resource strain: Not on file  . Food insecurity - worry: Not on file  . Food insecurity - inability: Not on file  . Transportation needs - medical: Not on file  . Transportation needs - non-medical: Not on file  Occupational History  . Not on file  Tobacco Use  . Smoking status: Former Smoker    Packs/day: 1.00    Types: Cigarettes    Last attempt to quit: 01/07/1992    Years since quitting: 25.7  . Smokeless tobacco: Never Used  Substance and Sexual Activity  . Alcohol use: Yes    Alcohol/week: 1.2 oz    Types: 2 Glasses of wine per week  . Drug use: No  . Sexual activity: Yes  Other Topics Concern  . Not on file  Social History Narrative  . Not on file   Family History  Problem Relation Age of Onset  . Cancer - Other Mother   . CVA Father   . Heart attack Brother   . Heart attack Maternal Grandmother    ROS: All systems reviewed and negative except as per HPI.  Current Outpatient Medications  Medication Sig Dispense Refill  . amiodarone (PACERONE) 200 MG tablet Take 1 tablet (200 mg total) by mouth daily. 30 tablet 6  . apixaban (ELIQUIS) 5 MG TABS tablet Take 1 tablet (5 mg total) by mouth  2 (two) times daily. 60 tablet 3  . carvedilol (COREG) 3.125 MG tablet Take 1 tablet (3.125 mg total) by mouth 2 (two) times daily with a meal. 60 tablet 3  . Cholecalciferol (VITAMIN D) 2000 units CAPS Take 2,000 Units by mouth daily.     . furosemide (LASIX) 40 MG tablet Take 1 tablet (40 mg total) by mouth daily. 45 tablet 3  . gabapentin (NEURONTIN) 300 MG capsule Take 300 mg by mouth daily.     . metFORMIN (GLUCOPHAGE-XR) 500 MG 24 hr tablet Take 500 mg by mouth daily with breakfast.    . Omega-3 Fatty Acids (FISH OIL) 1200 MG CAPS Take 1,200 mg by mouth 2 (two) times daily.     . simvastatin (ZOCOR) 40 MG tablet Take 40 mg by mouth daily at 6 PM.     . vitamin B-12 (CYANOCOBALAMIN) 1000 MCG tablet Take 1,000 mcg by mouth daily.     No  current facility-administered medications for this encounter.    BP (!) 114/52   Pulse (!) 54   Wt 256 lb (116.1 kg)   SpO2 98%   BMI 34.72 kg/m  General: NAD Neck: No JVD, no thyromegaly or thyroid nodule.  Lungs: Clear to auscultation bilaterally with normal respiratory effort. CV: Nondisplaced PMI.  Heart regular S1/S2, no S3/S4, no murmur.  Trace ankle edema.  No carotid bruit.  Normal pedal pulses.  Abdomen: Soft, nontender, no hepatosplenomegaly, no distention.  Skin: Intact without lesions or rashes.  Neurologic: Alert and oriented x 3.  Psych: Normal affect. Extremities: No clubbing or cyanosis.  HEENT: Normal.   Assessment/Plan: 1. Atrial fibrillation: DCCV in 12/18.  Recurrent of atrial fibrillation with initiation of amiodarone then TEE-guided DCCV (missed an Eliquis dose) in 1/19.  He is in NSR today.  - HR in 40s at times, decrease Coreg to 3.125 mg bid.  - With amiodarone use, check LFTs/TSH today, will need regular eye exam. Will decrease amiodarone to 100 mg daily at next appointment.  2. CKD: Stage 3.  BMET today.   3. Chronic diastolic CHF: EF 54-00% from 1/19 TEE.  He does not look volume overloaded on exam, NYHA class II symptoms.  - Continue Lasix 40 mg daily.   Followup in 3 months.   Loralie Champagne 09/17/2017

## 2017-09-25 DIAGNOSIS — R69 Illness, unspecified: Secondary | ICD-10-CM | POA: Diagnosis not present

## 2017-09-26 DIAGNOSIS — R69 Illness, unspecified: Secondary | ICD-10-CM | POA: Diagnosis not present

## 2017-10-03 ENCOUNTER — Encounter (HOSPITAL_COMMUNITY): Payer: Self-pay | Admitting: Emergency Medicine

## 2017-10-03 ENCOUNTER — Inpatient Hospital Stay (HOSPITAL_COMMUNITY)
Admission: EM | Admit: 2017-10-03 | Discharge: 2017-10-07 | DRG: 243 | Disposition: A | Discharge status: Home Health | Payer: Medicare HMO | Attending: Cardiology | Admitting: Cardiology

## 2017-10-03 DIAGNOSIS — J9611 Chronic respiratory failure with hypoxia: Secondary | ICD-10-CM | POA: Diagnosis not present

## 2017-10-03 DIAGNOSIS — I5032 Chronic diastolic (congestive) heart failure: Secondary | ICD-10-CM | POA: Diagnosis present

## 2017-10-03 DIAGNOSIS — Z96659 Presence of unspecified artificial knee joint: Secondary | ICD-10-CM | POA: Diagnosis present

## 2017-10-03 DIAGNOSIS — E1122 Type 2 diabetes mellitus with diabetic chronic kidney disease: Secondary | ICD-10-CM | POA: Diagnosis not present

## 2017-10-03 DIAGNOSIS — Z7984 Long term (current) use of oral hypoglycemic drugs: Secondary | ICD-10-CM | POA: Diagnosis not present

## 2017-10-03 DIAGNOSIS — Z881 Allergy status to other antibiotic agents status: Secondary | ICD-10-CM | POA: Diagnosis not present

## 2017-10-03 DIAGNOSIS — J9811 Atelectasis: Secondary | ICD-10-CM | POA: Diagnosis not present

## 2017-10-03 DIAGNOSIS — I481 Persistent atrial fibrillation: Secondary | ICD-10-CM | POA: Diagnosis present

## 2017-10-03 DIAGNOSIS — E785 Hyperlipidemia, unspecified: Secondary | ICD-10-CM | POA: Diagnosis present

## 2017-10-03 DIAGNOSIS — Z87891 Personal history of nicotine dependence: Secondary | ICD-10-CM | POA: Diagnosis not present

## 2017-10-03 DIAGNOSIS — I130 Hypertensive heart and chronic kidney disease with heart failure and stage 1 through stage 4 chronic kidney disease, or unspecified chronic kidney disease: Secondary | ICD-10-CM | POA: Diagnosis present

## 2017-10-03 DIAGNOSIS — Z959 Presence of cardiac and vascular implant and graft, unspecified: Secondary | ICD-10-CM

## 2017-10-03 DIAGNOSIS — Z8249 Family history of ischemic heart disease and other diseases of the circulatory system: Secondary | ICD-10-CM | POA: Diagnosis not present

## 2017-10-03 DIAGNOSIS — Z7901 Long term (current) use of anticoagulants: Secondary | ICD-10-CM | POA: Diagnosis not present

## 2017-10-03 DIAGNOSIS — N183 Chronic kidney disease, stage 3 (moderate): Secondary | ICD-10-CM | POA: Diagnosis not present

## 2017-10-03 DIAGNOSIS — I951 Orthostatic hypotension: Secondary | ICD-10-CM | POA: Diagnosis present

## 2017-10-03 DIAGNOSIS — R531 Weakness: Secondary | ICD-10-CM | POA: Diagnosis not present

## 2017-10-03 DIAGNOSIS — I480 Paroxysmal atrial fibrillation: Secondary | ICD-10-CM | POA: Diagnosis present

## 2017-10-03 DIAGNOSIS — Z79899 Other long term (current) drug therapy: Secondary | ICD-10-CM | POA: Diagnosis not present

## 2017-10-03 DIAGNOSIS — R296 Repeated falls: Secondary | ICD-10-CM | POA: Diagnosis present

## 2017-10-03 DIAGNOSIS — R001 Bradycardia, unspecified: Secondary | ICD-10-CM

## 2017-10-03 DIAGNOSIS — I495 Sick sinus syndrome: Principal | ICD-10-CM | POA: Diagnosis present

## 2017-10-03 DIAGNOSIS — J449 Chronic obstructive pulmonary disease, unspecified: Secondary | ICD-10-CM | POA: Diagnosis not present

## 2017-10-03 DIAGNOSIS — Z950 Presence of cardiac pacemaker: Secondary | ICD-10-CM | POA: Diagnosis not present

## 2017-10-03 LAB — COMPREHENSIVE METABOLIC PANEL
ALBUMIN: 4.2 g/dL (ref 3.5–5.0)
ALK PHOS: 62 U/L (ref 38–126)
ALT: 18 U/L (ref 17–63)
ANION GAP: 12 (ref 5–15)
AST: 24 U/L (ref 15–41)
BUN: 16 mg/dL (ref 6–20)
CALCIUM: 9.4 mg/dL (ref 8.9–10.3)
CHLORIDE: 101 mmol/L (ref 101–111)
CO2: 26 mmol/L (ref 22–32)
Creatinine, Ser: 1.31 mg/dL — ABNORMAL HIGH (ref 0.61–1.24)
GFR calc non Af Amer: 47 mL/min — ABNORMAL LOW (ref >60)
GFR, EST AFRICAN AMERICAN: 54 mL/min — AB (ref >60)
GLUCOSE: 91 mg/dL (ref 65–99)
Potassium: 4 mmol/L (ref 3.5–5.1)
SODIUM: 139 mmol/L (ref 135–145)
Total Bilirubin: 1.5 mg/dL — ABNORMAL HIGH (ref 0.3–1.2)
Total Protein: 7 g/dL (ref 6.5–8.1)

## 2017-10-03 LAB — CBC
HCT: 41.4 % (ref 39.0–52.0)
HEMOGLOBIN: 14 g/dL (ref 13.0–17.0)
MCH: 31.8 pg (ref 26.0–34.0)
MCHC: 33.8 g/dL (ref 30.0–36.0)
MCV: 94.1 fL (ref 78.0–100.0)
Platelets: 145 10*3/uL — ABNORMAL LOW (ref 150–400)
RBC: 4.4 MIL/uL (ref 4.22–5.81)
RDW: 13 % (ref 11.5–15.5)
WBC: 7.7 10*3/uL (ref 4.0–10.5)

## 2017-10-03 LAB — TROPONIN I: Troponin I: 0.2 ng/mL (ref <0.03)

## 2017-10-03 MED ORDER — ASPIRIN EC 81 MG PO TBEC
81.0000 mg | DELAYED_RELEASE_TABLET | Freq: Every day | ORAL | Status: DC
  Administered 2017-10-04: 81 mg via ORAL
  Filled 2017-10-03: qty 1

## 2017-10-03 MED ORDER — SIMVASTATIN 40 MG PO TABS
40.0000 mg | ORAL_TABLET | Freq: Every day | ORAL | Status: DC
  Administered 2017-10-04 – 2017-10-06 (×3): 40 mg via ORAL
  Filled 2017-10-03 (×2): qty 1

## 2017-10-03 MED ORDER — APIXABAN 5 MG PO TABS
5.0000 mg | ORAL_TABLET | Freq: Two times a day (BID) | ORAL | Status: DC
  Administered 2017-10-03 – 2017-10-04 (×2): 5 mg via ORAL
  Filled 2017-10-03 (×2): qty 1

## 2017-10-03 MED ORDER — GABAPENTIN 300 MG PO CAPS
300.0000 mg | ORAL_CAPSULE | Freq: Every day | ORAL | Status: DC
  Administered 2017-10-04 – 2017-10-07 (×3): 300 mg via ORAL
  Filled 2017-10-03: qty 3
  Filled 2017-10-03 (×2): qty 1

## 2017-10-03 MED ORDER — FUROSEMIDE 40 MG PO TABS
40.0000 mg | ORAL_TABLET | Freq: Every day | ORAL | Status: DC
  Administered 2017-10-04 – 2017-10-07 (×3): 40 mg via ORAL
  Filled 2017-10-03 (×3): qty 1

## 2017-10-03 MED ORDER — ACETAMINOPHEN 325 MG PO TABS: 650.0000 mg | ORAL_TABLET | ORAL | Status: DC | PRN

## 2017-10-03 MED ORDER — ONDANSETRON HCL 4 MG/2ML IJ SOLN: 4.0000 mg | Freq: Four times a day (QID) | INTRAMUSCULAR | Status: DC | PRN

## 2017-10-03 MED ORDER — METFORMIN HCL ER 500 MG PO TB24
500.0000 mg | ORAL_TABLET | Freq: Every day | ORAL | Status: DC
  Administered 2017-10-04 – 2017-10-07 (×3): 500 mg via ORAL
  Filled 2017-10-03 (×3): qty 1
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
## 2017-10-03 NOTE — ED Notes (Signed)
Cardiology here to see pt.
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
## 2017-10-03 NOTE — ED Notes (Signed)
Son in room with pt , no c/o pain

## 2017-10-04 DIAGNOSIS — I481 Persistent atrial fibrillation: Secondary | ICD-10-CM

## 2017-10-04 DIAGNOSIS — I951 Orthostatic hypotension: Secondary | ICD-10-CM

## 2017-10-04 LAB — MRSA PCR SCREENING: MRSA BY PCR: NEGATIVE

## 2017-10-04 LAB — BASIC METABOLIC PANEL
Anion gap: 13 (ref 5–15)
BUN: 14 mg/dL (ref 6–20)
CHLORIDE: 100 mmol/L — AB (ref 101–111)
CO2: 25 mmol/L (ref 22–32)
CREATININE: 1.17 mg/dL (ref 0.61–1.24)
Calcium: 9.1 mg/dL (ref 8.9–10.3)
GFR calc Af Amer: >60 mL/min (ref >60)
GFR calc non Af Amer: 53 mL/min — ABNORMAL LOW (ref >60)
Glucose, Bld: 102 mg/dL — ABNORMAL HIGH (ref 65–99)
Potassium: 3.7 mmol/L (ref 3.5–5.1)
SODIUM: 138 mmol/L (ref 135–145)

## 2017-10-04 LAB — TSH: TSH: 2.176 u[IU]/mL (ref 0.350–4.500)

## 2017-10-04 MED ORDER — AMIODARONE HCL 100 MG PO TABS
100.0000 mg | ORAL_TABLET | Freq: Every day | ORAL | Status: DC
  Administered 2017-10-04: 100 mg via ORAL
  Filled 2017-10-04: qty 1

## 2017-10-04 MED ORDER — SODIUM CHLORIDE 0.9 % IR SOLN
80.0000 mg | Status: DC
  Filled 2017-10-04: qty 2

## 2017-10-04 MED ORDER — CHLORHEXIDINE GLUCONATE 4 % EX LIQD
60.0000 mL | Freq: Once | CUTANEOUS | Status: AC
  Administered 2017-10-05: 4 via TOPICAL
  Filled 2017-10-04: qty 60

## 2017-10-04 MED ORDER — SODIUM CHLORIDE 0.9 % IV SOLN
INTRAVENOUS | Status: DC
  Administered 2017-10-05: 50 mL/h via INTRAVENOUS

## 2017-10-04 MED ORDER — SODIUM CHLORIDE 0.9 % IV SOLN: INTRAVENOUS | Status: DC

## 2017-10-04 MED ORDER — MEPERIDINE HCL 25 MG/ML IJ SOLN: 6.2500 mg | INTRAMUSCULAR | Status: DC | PRN

## 2017-10-04 MED ORDER — FENTANYL CITRATE (PF) 100 MCG/2ML IJ SOLN: 25.0000 ug | INTRAMUSCULAR | Status: DC | PRN

## 2017-10-04 MED ORDER — VANCOMYCIN HCL 10 G IV SOLR
1500.0000 mg | INTRAVENOUS | Status: DC
  Filled 2017-10-04 (×2): qty 1500

## 2017-10-04 MED ORDER — CHLORHEXIDINE GLUCONATE 4 % EX LIQD
60.0000 mL | Freq: Once | CUTANEOUS | Status: AC
  Administered 2017-10-04: 4 via TOPICAL
  Filled 2017-10-04: qty 15

## 2017-10-04 NOTE — Progress Notes (Signed)
Pt noted to have HR below 40s and sustaining.  Pt has been sitting in a chair since 1130. C/O dizziness.  Pt laying in bed now.  MD made aware.  Idolina Primer, RN

## 2017-10-04 NOTE — Progress Notes (Signed)
Called by telemetry.  Pt had a pause of 2.35 around 0730. Pt was asymptomatic. Idolina Primer, RN

## 2017-10-04 NOTE — Progress Notes (Signed)
Recurrent bradycardia while seated now at the upper limit of 5 halflives for carvedilol  Will proceed with pacing  Will also have to address the issue of orthostatic hypotension  The benefits and risks were reviewed including but not limited to death,  perforation, infection, lead dislodgement and device malfunction.  The patient understands agrees and is willing to proceed.

## 2017-10-04 NOTE — Progress Notes (Signed)
Patient ID: Dylan [name], male   DOB: 1925-11-29, 84 y.o.   MRN: [id number]     Advanced Heart Failure Rounding Note  PCP-Cardiologist: Loralie Champagne, MD   Subjective:    Feels good this morning.  HR 50s, NSR with PACs and a couple of short (2.5 sec) sinus pauses.  BP stable.  No further dizziness/lightheadedness.    Objective:   Weight Range: 252 lb 10.4 oz (114.6 kg) Body mass index is 34.27 kg/m.   Vital Signs:   Temp:  [97.5 F (36.4 C)-98 F (36.7 C)] 97.8 F (36.6 C) (02/15 0459) Pulse Rate:  [31-98] 52 (02/15 0459) Resp:  [13-19] 19 (02/15 0459) BP: (112-160)/(54-88) 128/54 (02/14 2351) SpO2:  [92 %-100 %] 94 % (02/15 0459) Weight:  [252 lb 6.8 oz (114.5 kg)-252 lb 10.4 oz (114.6 kg)] 252 lb 10.4 oz (114.6 kg) (02/15 0459) Last BM Date: 10/02/17  Weight change: Filed Weights   10/03/17 1835 10/04/17 0459  Weight: 252 lb 6.8 oz (114.5 kg) 252 lb 10.4 oz (114.6 kg)    Intake/Output:   Intake/Output Summary (Last 24 hours) at 10/04/2017 0842 Last data filed at 10/03/2017 2201 Gross per 24 hour  Intake -  Output 175 ml  Net -175 ml      Physical Exam    General:  Well appearing. No resp difficulty HEENT: Normal Neck: Supple. JVP not elevated. Carotids 2+ bilat; no bruits. No lymphadenopathy or thyromegaly appreciated. Cor: PMI nondisplaced. Regular rate & rhythm. No rubs, gallops or murmurs. Lungs: Clear Abdomen: Soft, nontender, nondistended. No hepatosplenomegaly. No bruits or masses. Good bowel sounds. Extremities: No cyanosis, clubbing, rash, edema Neuro: Alert & orientedx3, cranial nerves grossly intact. moves all 4 extremities w/o difficulty. Affect pleasant   Telemetry   NSR with PACs in 50s, couple short sinus pauses (around 2.5 sec).   Labs    CBC Recent Labs    10/03/17 1119  WBC 7.7  HGB 14.0  HCT 41.4  MCV 94.1  PLT 465*   Basic Metabolic Panel Recent Labs    10/03/17 1119 10/04/17 0436  NA 139 138  K 4.0 3.7  CL 101 100*    CO2 26 25  GLUCOSE 91 102*  BUN 16 14  CREATININE 1.31* 1.17  CALCIUM 9.4 9.1   Liver Function Tests Recent Labs    10/03/17 1119  AST 24  ALT 18  ALKPHOS 62  BILITOT 1.5*  PROT 7.0  ALBUMIN 4.2   No results for input(s): LIPASE, AMYLASE in the last 72 hours. Cardiac Enzymes Recent Labs    10/03/17 1715  TROPONINI 0.20*    BNP: BNP (last 3 results) Recent Labs    07/14/17 1601 08/03/17 1158  BNP 341.8* 318.9*    ProBNP (last 3 results) Recent Labs    08/01/17 1517  PROBNP 1,490*     D-Dimer No results for input(s): DDIMER in the last 72 hours. Hemoglobin A1C No results for input(s): HGBA1C in the last 72 hours. Fasting Lipid Panel No results for input(s): CHOL, HDL, LDLCALC, TRIG, CHOLHDL, LDLDIRECT in the last 72 hours. Thyroid Function Tests Recent Labs    10/04/17 0436  TSH 2.176    Other results:   Imaging     No results found.   Medications:     Scheduled Medications: . apixaban  5 mg Oral BID  . furosemide  40 mg Oral Daily  . gabapentin  300 mg Oral Daily  . metFORMIN  500 mg Oral Q breakfast  .  simvastatin  40 mg Oral q1800     Infusions:   PRN Medications:  acetaminophen, fentaNYL (SUBLIMAZE) injection, meperidine (DEMEROL) injection, ondansetron (ZOFRAN) IV    Patient Profile   84 yo male with PMH of PAF, DM, HTN and CHF who presented with weakness, dizziness and near syncope.   Assessment/Plan   1. Near syncope: Suspect due to bradycardia.  He was noted to have HR in 30s at times yesterday in ER, alternating between sinus bradycardia and short runs of junctional rhythm.  He had been on amiodarone 200 mg daily + Coreg 6.25 mg bid at home.  Nodal blockers were stopped.  Overnight, he had primarily NSR in 50s with short 2.5 sec sinus pause.  Suspect tachy-brady syndrome.  - Will have EP see him today.  Suspect best course will be to watch him off nodal blockers and defer PPM unless he has symptoms off nodal  blockers but will await EP opinion.  If he goes back into atrial fibrillation off amiodarone and is symptomatic, will need pacemaker to allow control (suspect he has a fairly high risk of going back into atrial fibrillation). - Regardless of PPM or no PPM, will watch him overnight.  Possible discharge tomorrow off nodal blockers.   2. Paroxysmal atrial fibrillation: Currently in NSR.  He required amiodarone to get back into NSR.  Now off with symptomatic bradycardia.  He developed diastolic CHF while in atrial fibrillation.  Suspect tachy-brady syndrome as above, may end up requiring PPM to allow control of atrial fibrillation.  - Continue Eliquis.  - Off amiodarone and Coreg for now.  3. Chronic diastolic CHF: Fairly quiescent while in NSR. TEE 1/19 with EF 55-60%.  - Continue Lasix.  4. Elevated troponin: Mild elevation, suspect demand ischemia from hypotension due to bradycardia.     Length of Stay: 1  Loralie Champagne, MD  10/04/2017, 8:42 AM  Advanced Heart Failure Team Pager [phone] (M-F; 7a - 4p)  Please contact [facility] for night-coverage after hours (4p -7a ) and weekends on [email or web address]

## 2017-10-04 NOTE — Care Management Note (Signed)
Case Management Note  Patient Details  Name: Dylan [name] MRN: [id number] Date of Birth: [birth year]  Subjective/Objective:            Patient admitted from home w wife for symptomatic bradycardia. Medication washout, eval for pacer. No home needs identified at this time, CM will continue to follow.       Action/Plan:   Expected Discharge Date:                  Expected Discharge Plan:  Home/Self Care  In-House Referral:     Discharge planning Services  CM Consult  Post Acute Care Choice:    Choice offered to:     DME Arranged:    DME Agency:     HH Arranged:    HH Agency:     Status of Service:  In process, will continue to follow  If discussed at Long Length of Stay Meetings, dates discussed:    Additional Comments:  Carles Collet, RN 10/04/2017, 3:28 PM

## 2017-10-04 NOTE — H&P (View-Only) (Signed)
Recurrent bradycardia while seated now at the upper limit of 5 halflives for carvedilol  Will proceed with pacing  Will also have to address the issue of orthostatic hypotension  The benefits and risks were reviewed including but not limited to death,  perforation, infection, lead dislodgement and device malfunction.  The patient understands agrees and is willing to proceed.

## 2017-10-04 NOTE — Progress Notes (Signed)
  Paged for lightheadedness and pt rate in 30s.  Lowest 35.         Reviewed tele. Pt went into junctional rhythm at ~35-40 bpm from approx 1450 to 1515.  MD aware. No changes at this time. Discussed with EP who will evaluate further if any changes need to be made.  Coreg should be "washed out" soon.   On my arrival to room pt with HR in 55s and feeling "better" though still fatigued. Lightheadedness much improved.    Will continue to follow closely.    Legrand Como [address]" [place], PA-C 10/04/2017 3:41 PM

## 2017-10-04 NOTE — Progress Notes (Signed)
Patient had a 2.26 seconds pause at 0647 per CCMD. Patient was asleep at the time. Will continue to monitor.

## 2017-10-04 NOTE — Discharge Instructions (Addendum)
Supplemental Discharge Instructions for  Pacemaker/Defibrillator Patients  Activity No heavy lifting or vigorous activity with your left/right arm for 6 to 8 weeks.  Do not raise your left/right arm above your head for one week.  Gradually raise your affected arm as drawn below.             10/09/17                     10/10/17                     10/11/17                  10/12/17 __   NO DRIVING until cleared to by Dr. Caryl Comes.  WOUND CARE - Keep the wound area clean and dry.  Do not get this area wet for one week. No showers for one week; you may shower on   10/12/17  . - The tape/steri-strips on your wound will fall off; do not pull them off.  No bandage is needed on the site.  DO  NOT apply any creams, oils, or ointments to the wound area. - If you notice any drainage or discharge from the wound, any swelling or bruising at the site, or you develop a fever > 101? F after you are discharged home, call the office at once.  Special Instructions - You are still able to use cellular telephones; use the ear opposite the side where you have your pacemaker/defibrillator.  Avoid carrying your cellular phone near your device. - When traveling through airports, show security personnel your identification card to avoid being screened in the metal detectors.  Ask the security personnel to use the hand wand. - Avoid arc welding equipment, MRI testing (magnetic resonance imaging), TENS units (transcutaneous nerve stimulators).  Call the office for questions about other devices. - Avoid electrical appliances that are in poor condition or are not properly grounded. - Microwave ovens are safe to be near or to operate.  Additional information for defibrillator patients should your device go off: - If your device goes off ONCE and you feel fine afterward, notify the device clinic nurses. - If your device goes off ONCE and you do not feel well afterward, call 911. - If your device goes off TWICE, call  911. - If your device goes off THREE times in one day, call 911.  DO NOT DRIVE YOURSELF OR A FAMILY MEMBER WITH A DEFIBRILLATOR TO THE HOSPITAL--CALL 911.   Information on my medicine - ELIQUIS (apixaban)  Why was Eliquis prescribed for you? Eliquis was prescribed for you to reduce the risk of a blood clot forming that can cause a stroke if you have a medical condition called atrial fibrillation (a type of irregular heartbeat).  What do You need to know about Eliquis ? Take your Eliquis TWICE DAILY - one tablet in the morning and one tablet in the evening with or without food. If you have difficulty swallowing the tablet whole please discuss with your pharmacist how to take the medication safely.  Take Eliquis exactly as prescribed by your doctor and DO NOT stop taking Eliquis without talking to the doctor who prescribed the medication.  Stopping may increase your risk of developing a stroke.  Refill your prescription before you run out.  After discharge, you should have regular check-up appointments with your healthcare provider that is prescribing your Eliquis.  In the future your dose may need to  be changed if your kidney function or weight changes by a significant amount or as you get older.  What do you do if you miss a dose? If you miss a dose, take it as soon as you remember on the same day and resume taking twice daily.  Do not take more than one dose of ELIQUIS at the same time to make up a missed dose.  Important Safety Information A possible side effect of Eliquis is bleeding. You should call your healthcare provider right away if you experience any of the following: ? Bleeding from an injury or your nose that does not stop. ? Unusual colored urine (red or dark brown) or unusual colored stools (red or black). ? Unusual bruising for unknown reasons. ? A serious fall or if you hit your head (even if there is no bleeding).  Some medicines may interact with Eliquis and  might increase your risk of bleeding or clotting while on Eliquis. To help avoid this, consult your healthcare provider or pharmacist prior to using any new prescription or non-prescription medications, including herbals, vitamins, non-steroidal anti-inflammatory drugs (NSAIDs) and supplements.  This website has more information on Eliquis (apixaban): http://www.eliquis.com/eliquis/home
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
## 2017-10-05 ENCOUNTER — Inpatient Hospital Stay (HOSPITAL_COMMUNITY)
Admission: EM | Disposition: A | Discharge status: Home Health | Payer: Self-pay | Source: Home / Self Care | Attending: Internal Medicine

## 2017-10-05 ENCOUNTER — Inpatient Hospital Stay (HOSPITAL_COMMUNITY): Payer: Medicare HMO

## 2017-10-05 HISTORY — PX: PACEMAKER IMPLANT: EP1218

## 2017-10-05 LAB — BASIC METABOLIC PANEL
Anion gap: 13 (ref 5–15)
BUN: 19 mg/dL (ref 6–20)
CO2: 26 mmol/L (ref 22–32)
CREATININE: 1.35 mg/dL — AB (ref 0.61–1.24)
Calcium: 9.1 mg/dL (ref 8.9–10.3)
Chloride: 98 mmol/L — ABNORMAL LOW (ref 101–111)
GFR calc Af Amer: 52 mL/min — ABNORMAL LOW (ref >60)
GFR, EST NON AFRICAN AMERICAN: 45 mL/min — AB (ref >60)
GLUCOSE: 113 mg/dL — AB (ref 65–99)
Potassium: 3.6 mmol/L (ref 3.5–5.1)
Sodium: 137 mmol/L (ref 135–145)

## 2017-10-05 LAB — SURGICAL PCR SCREEN
MRSA, PCR: NEGATIVE
Staphylococcus aureus: NEGATIVE

## 2017-10-05 LAB — GLUCOSE, CAPILLARY: GLUCOSE-CAPILLARY: 121 mg/dL — AB (ref 65–99)

## 2017-10-05 SURGERY — PACEMAKER IMPLANT
Anesthesia: LOCAL

## 2017-10-05 MED ORDER — AMIODARONE HCL 200 MG PO TABS
200.0000 mg | ORAL_TABLET | Freq: Every day | ORAL | Status: DC
  Administered 2017-10-06 – 2017-10-07 (×2): 200 mg via ORAL
  Filled 2017-10-05 (×2): qty 1

## 2017-10-05 MED ORDER — ONDANSETRON HCL 4 MG/2ML IJ SOLN: 4.0000 mg | Freq: Four times a day (QID) | INTRAMUSCULAR | Status: DC | PRN

## 2017-10-05 MED ORDER — LIDOCAINE HCL (PF) 1 % IJ SOLN
INTRAMUSCULAR | Status: AC
  Filled 2017-10-05: qty 60

## 2017-10-05 MED ORDER — MIDAZOLAM HCL 5 MG/5ML IJ SOLN
INTRAMUSCULAR | Status: DC | PRN
  Administered 2017-10-05: 1 mg via INTRAVENOUS

## 2017-10-05 MED ORDER — ACETAMINOPHEN 325 MG PO TABS
325.0000 mg | ORAL_TABLET | ORAL | Status: DC | PRN
  Administered 2017-10-05 – 2017-10-06 (×3): 650 mg via ORAL
  Filled 2017-10-05 (×3): qty 2

## 2017-10-05 MED ORDER — VANCOMYCIN HCL IN DEXTROSE 1-5 GM/200ML-% IV SOLN
INTRAVENOUS | Status: AC
  Filled 2017-10-05: qty 200

## 2017-10-05 MED ORDER — HEPARIN (PORCINE) IN NACL 2-0.9 UNIT/ML-% IJ SOLN
INTRAMUSCULAR | Status: AC
  Filled 2017-10-05: qty 500

## 2017-10-05 MED ORDER — SODIUM CHLORIDE 0.9 % IV SOLN
INTRAVENOUS | Status: DC | PRN
  Administered 2017-10-05: 100 mL/h via INTRAVENOUS

## 2017-10-05 MED ORDER — LIDOCAINE HCL (PF) 1 % IJ SOLN
INTRAMUSCULAR | Status: DC | PRN
  Administered 2017-10-05: 30 mL via INTRADERMAL
  Administered 2017-10-05: 45 mL via INTRADERMAL

## 2017-10-05 MED ORDER — FENTANYL CITRATE (PF) 100 MCG/2ML IJ SOLN
INTRAMUSCULAR | Status: AC
  Filled 2017-10-05: qty 2

## 2017-10-05 MED ORDER — SODIUM CHLORIDE 0.9 % IV SOLN: INTRAVENOUS | Status: AC

## 2017-10-05 MED ORDER — IOPAMIDOL (ISOVUE-370) INJECTION 76%
INTRAVENOUS | Status: DC | PRN
  Administered 2017-10-05: 20 mL via INTRAVENOUS
  Administered 2017-10-05: 15 mL via INTRAVENOUS

## 2017-10-05 MED ORDER — VANCOMYCIN HCL IN DEXTROSE 1-5 GM/200ML-% IV SOLN
1000.0000 mg | Freq: Two times a day (BID) | INTRAVENOUS | Status: AC
  Administered 2017-10-05: 1000 mg via INTRAVENOUS
  Filled 2017-10-05: qty 200

## 2017-10-05 MED ORDER — VANCOMYCIN HCL 1000 MG IV SOLR
INTRAVENOUS | Status: DC | PRN
  Administered 2017-10-05: 500 mg via INTRAVENOUS
  Administered 2017-10-05: 1000 mg via INTRAVENOUS

## 2017-10-05 MED ORDER — FENTANYL CITRATE (PF) 100 MCG/2ML IJ SOLN
INTRAMUSCULAR | Status: DC | PRN
  Administered 2017-10-05 (×2): 50 ug via INTRAVENOUS

## 2017-10-05 MED ORDER — LIDOCAINE HCL (PF) 1 % IJ SOLN
INTRAMUSCULAR | Status: AC
  Filled 2017-10-05: qty 30

## 2017-10-05 MED ORDER — MIDAZOLAM HCL 5 MG/5ML IJ SOLN
INTRAMUSCULAR | Status: AC
  Filled 2017-10-05: qty 5

## 2017-10-05 MED ORDER — CARVEDILOL 6.25 MG PO TABS
6.2500 mg | ORAL_TABLET | Freq: Two times a day (BID) | ORAL | Status: DC
  Administered 2017-10-05: 6.25 mg via ORAL
  Filled 2017-10-05: qty 1

## 2017-10-05 MED ORDER — IOPAMIDOL (ISOVUE-370) INJECTION 76%
INTRAVENOUS | Status: AC
  Filled 2017-10-05: qty 50

## 2017-10-05 MED ORDER — HEPARIN (PORCINE) IN NACL 2-0.9 UNIT/ML-% IJ SOLN
INTRAMUSCULAR | Status: AC | PRN
  Administered 2017-10-05: 500 mL

## 2017-10-05 MED ORDER — GENTAMICIN SULFATE 40 MG/ML IJ SOLN
INTRAMUSCULAR | Status: AC
  Filled 2017-10-05: qty 2

## 2017-10-05 SURGICAL SUPPLY — 12 items
CABLE SURGICAL S-101-97-12 (CABLE) IMPLANT
HEMOSTAT SURGICEL 2X4 FIBR (HEMOSTASIS) ×1 IMPLANT
IPG PACE AZUR XT DR MRI W1DR01 (Pacemaker) IMPLANT
LEAD CAPSURE NOVUS 5076-52CM (Lead) ×1 IMPLANT
LEAD CAPSURE NOVUS 5076-58CM (Lead) ×1 IMPLANT
PACE AZURE XT DR MRI W1DR01 (Pacemaker) ×2 IMPLANT
PAD DEFIB LIFELINK (PAD) IMPLANT
SET INTRODUCER MICROPUNCT 5F (INTRODUCER) ×1 IMPLANT
SHEATH CLASSIC 7F (SHEATH) ×3 IMPLANT
SHEATH CLASSIC 7F 25CM (SHEATH) ×1 IMPLANT
TRAY PACEMAKER INSERTION (PACKS) IMPLANT
WIRE HI TORQ VERSACORE-J 145CM (WIRE) ×1 IMPLANT

## 2017-10-05 NOTE — Progress Notes (Addendum)
Progress Note  Patient Name: Dylan [name] Date of Encounter: 10/05/2017    Primary Electrophysiologist: sk    Patient Profile     84 y.o. male  With syncope and documented orthostasis and sinus node dysfunction  Subjective   wihtout chest pain or dizziness but in bed  Inpatient Medications    Scheduled Meds: . amiodarone  100 mg Oral Daily  . furosemide  40 mg Oral Daily  . gabapentin  300 mg Oral Daily  . metFORMIN  500 mg Oral Q breakfast  . simvastatin  40 mg Oral q1800   Continuous Infusions: . sodium chloride    . vancomycin     PRN Meds: acetaminophen, ondansetron (ZOFRAN) IV   Vital Signs    Vitals:   10/05/17 1057 10/05/17 1101 10/05/17 1106 10/05/17 1111  BP:      Pulse: 93 (!) 0 (!) 0 (!) 0  Resp: (!) 9 (!) 5 (!) 0 (!) 0  Temp:      TempSrc:      SpO2: 95% (!) 0% (!) 0% (!) 0%  Weight:      Height:        Intake/Output Summary (Last 24 hours) at 10/05/2017 1153 Last data filed at 10/05/2017 0645 Gross per 24 hour  Intake 34.17 ml  Output -  Net 34.17 ml   Filed Weights   10/03/17 1835 10/04/17 0459 10/05/17 0534  Weight: 252 lb 6.8 oz (114.5 kg) 252 lb 10.4 oz (114.6 kg) 246 lb 9.6 oz (111.9 kg)    Telemetry    Sinus node dysfunction  Personally Reviewed  ECG       Physical Exam    GEN: No acute distress.   Neck: JVD  cmflat Cardiac: RRR, no  murmurs, rubs, or gallops.  Respiratory: Clear to auscultation bilaterally. GI: Soft, nontender, non-distended  MS:  edema; No deformity. Neuro:  Nonfocal  Psych: Normal affect  Skin Warm and dry   Labs    Chemistry Recent Labs  Lab 10/03/17 1119 10/04/17 0436 10/05/17 0437  NA 139 138 137  K 4.0 3.7 3.6  CL 101 100* 98*  CO2 26 25 26   GLUCOSE 91 102* 113*  BUN 16 14 19   CREATININE 1.31* 1.17 1.35*  CALCIUM 9.4 9.1 9.1  PROT 7.0  --   --   ALBUMIN 4.2  --   --   AST 24  --   --   ALT 18  --   --   ALKPHOS 62  --   --   BILITOT 1.5*  --   --   GFRNONAA 47* 53* 45*   GFRAA 54* >60 52*  ANIONGAP 12 13 13      Hematology Recent Labs  Lab 10/03/17 1119  WBC 7.7  RBC 4.40  HGB 14.0  HCT 41.4  MCV 94.1  MCH 31.8  MCHC 33.8  RDW 13.0  PLT 145*    Cardiac Enzymes Recent Labs  Lab 10/03/17 1715  TROPONINI 0.20*   No results for input(s): TROPIPOC in the last 168 hours.   BNPNo results for input(s): BNP, PROBNP in the last 168 hours.   DDimer No results for input(s): DDIMER in the last 168 hours.   Radiology    No results found.  Cardiac Studies    *  Device Interrogation        Assessment & Plan     Assessment and recommendations   Syncope  Bradycardia-sinus  Atrial fibrillation-persistent  Orthostatic hypotension  Hypertension  HFpEF  Pacer today  Increase amiodarone   Will resume apixoban in 24 -36 hrs  Will add back back carvedilol for BP  Need to add Signed, Virl Axe, MD  10/05/2017, 11:53 AM

## 2017-10-05 NOTE — Interval H&P Note (Signed)
History and Physical Interval Note:  10/05/2017 9:11 AM  Dylan [name]  has presented today for surgery, with the diagnosis of bradycardia  The various methods of treatment have been discussed with the patient and family. After consideration of risks, benefits and other options for treatment, the patient has consented to  Procedure(s): PACEMAKER IMPLANT (N/A) as a surgical intervention .  The patient's history has been reviewed, patient examined, no change in status, stable for surgery.  I have reviewed the patient's chart and labs.  Questions were answered to the patient's satisfaction.     Seen this am   No changes overnight  Procedure reviewed   Virl Axe

## 2017-10-06 ENCOUNTER — Inpatient Hospital Stay (HOSPITAL_COMMUNITY): Payer: Medicare HMO

## 2017-10-06 LAB — GLUCOSE, CAPILLARY
GLUCOSE-CAPILLARY: 99 mg/dL (ref 65–99)
Glucose-Capillary: 106 mg/dL — ABNORMAL HIGH (ref 65–99)
Glucose-Capillary: 104 mg/dL — ABNORMAL HIGH (ref 65–99)
Glucose-Capillary: 123 mg/dL — ABNORMAL HIGH (ref 65–99)

## 2017-10-06 LAB — BASIC METABOLIC PANEL
Anion gap: 11 (ref 5–15)
BUN: 17 mg/dL (ref 6–20)
CO2: 25 mmol/L (ref 22–32)
CREATININE: 1.09 mg/dL (ref 0.61–1.24)
Calcium: 9 mg/dL (ref 8.9–10.3)
Chloride: 101 mmol/L (ref 101–111)
GFR, EST NON AFRICAN AMERICAN: 58 mL/min — AB (ref >60)
Glucose, Bld: 119 mg/dL — ABNORMAL HIGH (ref 65–99)
Potassium: 3.9 mmol/L (ref 3.5–5.1)
Sodium: 137 mmol/L (ref 135–145)

## 2017-10-06 MED ORDER — CARVEDILOL 3.125 MG PO TABS
3.1250 mg | ORAL_TABLET | Freq: Two times a day (BID) | ORAL | Status: DC
  Administered 2017-10-06 – 2017-10-07 (×2): 3.125 mg via ORAL
  Filled 2017-10-06 (×2): qty 1

## 2017-10-06 NOTE — Plan of Care (Signed)
Pt alert and oriented, VSS, pacemaker site a little swollen but C/D/I, atrial paced on the monitor in the 60's.

## 2017-10-06 NOTE — Progress Notes (Signed)
SATURATION QUALIFICATIONS: (This note is used to comply with regulatory documentation for home oxygen)  Patient Saturations on Room Air at Rest = 95 %  Patient Saturations on Room Air while Ambulating =80 %  Patient Saturations on 3 Liters of oxygen while Ambulating =  92 %

## 2017-10-06 NOTE — Progress Notes (Signed)
Progress Note  Patient Name: Dylan [name] Date of Encounter: 10/06/2017    Primary Electrophysiologist: sk    Patient Profile     84 y.o. male  With syncope and documented orthostasis and sinus node dysfunction  Subjective   Feels better.  Some lightheadedness this morning with standing  Some pain yday during pacer insertion   Inpatient Medications    Scheduled Meds: . amiodarone  200 mg Oral Daily  . carvedilol  6.25 mg Oral BID WC  . furosemide  40 mg Oral Daily  . gabapentin  300 mg Oral Daily  . metFORMIN  500 mg Oral Q breakfast  . simvastatin  40 mg Oral q1800   Continuous Infusions:  PRN Meds: acetaminophen, ondansetron (ZOFRAN) IV   Vital Signs    Vitals:   10/05/17 2038 10/06/17 0454 10/06/17 0700 10/06/17 0751  BP: 113/69  (!) 147/75 133/81  Pulse: 64   60  Resp: 16 16    Temp: (!) 97.3 F (36.3 C) 98 F (36.7 C)  97.7 F (36.5 C)  TempSrc: Oral Oral  Oral  SpO2: 94% 95% 94% 95%  Weight:  249 lb 4.8 oz (113.1 kg)    Height:        Intake/Output Summary (Last 24 hours) at 10/06/2017 0816 Last data filed at 10/06/2017 0445 Gross per 24 hour  Intake 1210 ml  Output 750 ml  Net 460 ml   Filed Weights   10/04/17 0459 10/05/17 0534 10/06/17 0454  Weight: 252 lb 10.4 oz (114.6 kg) 246 lb 9.6 oz (111.9 kg) 249 lb 4.8 oz (113.1 kg)    Telemetry    apacing  Personally Reviewed  ECG     ECG personally reviewed atrial pacing  Physical Exam  Well developed and nourished in no acute distress HENT normal Neck supple with JVP-flat Clear Pocket without  hematoma, swelling or tenderness  Regular rate and rhythm, no murmurs or gallops Abd-soft with active BS No Clubbing cyanosis edema Skin-warm and dry A & Oriented  Grossly normal sensory and motor function   Labs    Chemistry Recent Labs  Lab 10/03/17 1119 10/04/17 0436 10/05/17 0437 10/06/17 0708  NA 139 138 137 137  K 4.0 3.7 3.6 3.9  CL 101 100* 98* 101  CO2 26 25 26 25     GLUCOSE 91 102* 113* 119*  BUN 16 14 19 17   CREATININE 1.31* 1.17 1.35* 1.09  CALCIUM 9.4 9.1 9.1 9.0  PROT 7.0  --   --   --   ALBUMIN 4.2  --   --   --   AST 24  --   --   --   ALT 18  --   --   --   ALKPHOS 62  --   --   --   BILITOT 1.5*  --   --   --   GFRNONAA 47* 53* 45* 58*  GFRAA 54* >60 52* >60  ANIONGAP 12 13 13 11      Hematology Recent Labs  Lab 10/03/17 1119  WBC 7.7  RBC 4.40  HGB 14.0  HCT 41.4  MCV 94.1  MCH 31.8  MCHC 33.8  RDW 13.0  PLT 145*    Cardiac Enzymes Recent Labs  Lab 10/03/17 1715  TROPONINI 0.20*   No results for input(s): TROPIPOC in the last 168 hours.   BNPNo results for input(s): BNP, PROBNP in the last 168 hours.   DDimer No results for input(s): DDIMER  in the last 168 hours.   Radiology    Personally reviewed  Device well   Cardiac Studies    *  Device Interrogation     normal device function    Assessment & Plan     Assessment and recommendations   Syncope  Bradycardia-sinus  Atrial fibrillation-persistent  Orthostatic hypotension  Hypertension  HFpEF  BP improved; will decrease carvedilol  Ambulate today and will assess for discharge later today   With orthostasis though suspect 140 sys recumbent is a better target than 120  Will use abd binded  resume apixoban in 24 hrs  Signed, Virl Axe, MD  10/06/2017, 8:16 AM

## 2017-10-07 ENCOUNTER — Encounter (HOSPITAL_COMMUNITY): Payer: Self-pay | Admitting: Internal Medicine

## 2017-10-07 DIAGNOSIS — J9611 Chronic respiratory failure with hypoxia: Secondary | ICD-10-CM

## 2017-10-07 LAB — BASIC METABOLIC PANEL
ANION GAP: 11 (ref 5–15)
BUN: 16 mg/dL (ref 6–20)
CALCIUM: 9 mg/dL (ref 8.9–10.3)
CO2: 26 mmol/L (ref 22–32)
Chloride: 102 mmol/L (ref 101–111)
Creatinine, Ser: 1.15 mg/dL (ref 0.61–1.24)
GFR, EST NON AFRICAN AMERICAN: 54 mL/min — AB (ref >60)
GLUCOSE: 106 mg/dL — AB (ref 65–99)
Potassium: 3.6 mmol/L (ref 3.5–5.1)
SODIUM: 139 mmol/L (ref 135–145)

## 2017-10-07 LAB — GLUCOSE, CAPILLARY
GLUCOSE-CAPILLARY: 98 mg/dL (ref 65–99)
Glucose-Capillary: 97 mg/dL (ref 65–99)

## 2017-10-07 MED ORDER — APIXABAN 5 MG PO TABS: 5.0000 mg | ORAL_TABLET | Freq: Two times a day (BID) | ORAL | Status: DC

## 2017-10-07 MED ORDER — AMIODARONE HCL 200 MG PO TABS: 100.0000 mg | ORAL_TABLET | Freq: Every day | ORAL | 6 refills | Status: DC

## 2017-10-07 MED FILL — Gentamicin Sulfate Inj 40 MG/ML: INTRAMUSCULAR | Qty: 80 | Status: AC

## 2017-10-07 NOTE — Progress Notes (Signed)
SATURATION QUALIFICATIONS: (This note is used to comply with regulatory documentation for home oxygen)  Patient Saturations on Room Air at Rest =  97 %  Patient Saturations on Room Air while Ambulating = 96 %  Patient Saturations on 2 Liters of oxygen while Ambulating =  97 %  Please briefly explain why patient needs home oxygen:

## 2017-10-07 NOTE — Discharge Summary (Addendum)
Advanced Heart Failure Discharge Note  Discharge Summary   Patient ID: Dylan [name] MRN: [id number], DOB/AGE: 82-Aug-1927 82 y.o. Admit date: 10/03/2017 D/C date:     10/07/2017   Primary Discharge Diagnoses:  1. Sinus bradycardia with near syncope - s/p PPM 10/05/16.  2. Paroxysmal atrial fibrillation 3. Chronic diastolic CHF: Fairly quiescent while in NSR. TEE 1/19 with EF 55-60%.  4. Elevated troponin 5. Chronic hypoxic respiratory failure:   ?COPD given long prior smoking history.   Hospital Course:   Dylan [name] is a 84 y.o. male with PMH of PAF, DM, HTN and CHF who presented with weakness, dizziness and near syncope.  Presented to [facility] 10/03/17 with near syncope. EKG showed bradycardia into 30s with sinus pauses.  Admitted for further evaluation and treatment. Coreg held and amiodarone cut back for wash out.   Overnight into 10/04/17 pt had 2.5 second pause and EP consulted. Watchful waiting pursued with BB wash out.  Evening of 10/04/17 pt had junctional rhythm into 30s for > 20 minutes, so PPM decided on.   Pt underwent PPM placement 5/42/70 without complication.   Hospital course complicated by hypoxia with ambulation on room air, with de-saturation into 80s.  O2 ordered for home.  CXR clear, so thought to possible be component of COPD with long smoking history vs atelectasis with hospital stay.     Of note, pt walked again am of 10/07/17 and found stable off O2.  No home O2 will be required at this time.   Examined am of 10/07/17 and determined to be stable for home with close follow up as below.    Discharge Weight Range: 249 lbs Discharge Vitals: Blood pressure (!) 150/67, pulse 60, temperature 97.6 F (36.4 C), resp. rate 16, height 6' (1.829 m), weight 249 lb 4.7 oz (113.1 kg), SpO2 93 %.  Labs: Lab Results  Component Value Date   WBC 7.7 10/03/2017   HGB 14.0 10/03/2017   HCT 41.4 10/03/2017   MCV 94.1 10/03/2017   PLT 145 (L) 10/03/2017    Recent Labs  Lab  10/03/17 1119  10/07/17 0412  NA 139   < > 139  K 4.0   < > 3.6  CL 101   < > 102  CO2 26   < > 26  BUN 16   < > 16  CREATININE 1.31*   < > 1.15  CALCIUM 9.4   < > 9.0  PROT 7.0  --   --   BILITOT 1.5*  --   --   ALKPHOS 62  --   --   ALT 18  --   --   AST 24  --   --   GLUCOSE 91   < > 106*   < > = values in this interval not displayed.   No results found for: CHOL, HDL, LDLCALC, TRIG BNP (last 3 results) Recent Labs    07/14/17 1601 08/03/17 1158  BNP 341.8* 318.9*   ProBNP (last 3 results) Recent Labs    08/01/17 1517  PROBNP 1,490*   Diagnostic Studies/Procedures   Dg Chest 2 View  Result Date: 10/06/2017 CLINICAL DATA:  Pacer device. EXAM: CHEST  2 VIEW COMPARISON:  10/05/2017 FINDINGS: AP and lateral views of the chest show no evidence for pneumothorax. No pulmonary edema or pleural effusion. The cardio pericardial silhouette is enlarged. Left-sided permanent pacemaker again noted. The visualized bony structures of the thorax are intact. IMPRESSION: No active cardiopulmonary disease. Electronically Signed  By: Misty Stanley M.D.   On: 10/06/2017 08:45   Dg Chest Port 1 View  Result Date: 10/05/2017 CLINICAL DATA:  Rule out pneumothorax. EXAM: PORTABLE CHEST 1 VIEW COMPARISON:  Chest x-ray dated August 03, 2017. FINDINGS: Interval placement of a left chest wall AICD with leads terminating in the right atrium and right ventricle. Stable cardiomegaly. Normal pulmonary vascularity. Low lung volumes with bibasilar atelectasis. No focal consolidation, pleural effusion, or pneumothorax. No acute osseous abnormality. IMPRESSION: 1. Interval placement of a left chest wall AICD.  No pneumothorax. 2. Low lung volumes with bibasilar atelectasis. Electronically Signed   By: Titus Dubin M.D.   On: 10/05/2017 14:01   Discharge Medications   Allergies as of 10/07/2017      Reactions   Keflex [cephalexin] Hives, Other (See Comments)   Occurred in the 1980's        Medication List    TAKE these medications   amiodarone 200 MG tablet Commonly known as:  PACERONE Take 0.5 tablets (100 mg total) by mouth daily. What changed:  how much to take   apixaban 5 MG Tabs tablet Commonly known as:  ELIQUIS Take 1 tablet (5 mg total) by mouth 2 (two) times daily.   carvedilol 3.125 MG tablet Commonly known as:  COREG Take 1 tablet (3.125 mg total) by mouth 2 (two) times daily with a meal.   Fish Oil 1200 MG Caps Take 1,200 mg by mouth 2 (two) times daily.   furosemide 40 MG tablet Commonly known as:  LASIX Take 1 tablet (40 mg total) by mouth daily.   gabapentin 300 MG capsule Commonly known as:  NEURONTIN Take 300 mg by mouth daily.   metFORMIN 500 MG 24 hr tablet Commonly known as:  GLUCOPHAGE-XR Take 500 mg by mouth daily with breakfast.   simvastatin 40 MG tablet Commonly known as:  ZOCOR Take 40 mg by mouth daily at 6 PM.   vitamin B-12 1000 MCG tablet Commonly known as:  CYANOCOBALAMIN Take 1,000 mcg by mouth daily.   Vitamin D 2000 units Caps Take 2,000 Units by mouth daily.            Durable Medical Equipment  (From admission, onward)        Start     Ordered   10/07/17 0845  For home use only DME oxygen  Once    Question Answer Comment  Mode or (Route) Nasal cannula   Liters per Minute 3   Frequency Continuous (stationary and portable oxygen unit needed)   Oxygen conserving device Yes   Oxygen delivery system Gas      10/07/17 0845      Disposition   The patient will be discharged in stable condition to home. Discharge Instructions    (HEART FAILURE PATIENTS) Call MD:  Anytime you have any of the following symptoms: 1) 3 pound weight gain in 24 hours or 5 pounds in 1 week 2) shortness of breath, with or without a dry hacking cough 3) swelling in the hands, feet or stomach 4) if you have to sleep on extra pillows at night in order to breathe.   Complete by:  As directed    Diet - low sodium heart healthy    Complete by:  As directed    Increase activity slowly   Complete by:  As directed    STOP any activity that causes chest pain, shortness of breath, dizziness, sweating, or exessive weakness   Complete by:  As directed  Follow-up Information    Larey Dresser, MD. Go on 10/14/2017.   Specialty:  Cardiology Why:  11:40 AM, Advanced [facility] Failure [facility], parking code [place] information: [place] [address] [phone]        [place] Follow up on 10/21/2017.   Specialty:  Cardiology Why:  10:00AM, wound check visit Contact information: [address], [address] [place]       Deboraha Sprang, MD Follow up on 01/02/2018.   Specialty:  Cardiology Why:  2:00PM Contact information: [address] [address] [phone]            Duration of Discharge Encounter: Greater than 35 minutes   Signed, [name] 10/07/2017, 8:50 AM

## 2017-10-07 NOTE — Progress Notes (Signed)
Patient ID: Dylan [name], male   DOB: 1926-01-06, 84 y.o.   MRN: [id number]     Advanced Heart Failure Rounding Note  PCP-Cardiologist: Dylan Champagne, MD   Subjective:    S/p PPM 10/05/17 for tachy-brady syndrome.   Feeling OK this am. Denies lightheadedness or dizziness. No SOB. ICD site pain well controlled, just sore and slightly "itchy"  O2 dropped to 80% while ambulating in halls, then up to 92% with O2 at 3 lpm. Will need O2 for home.  Objective:   Weight Range: 249 lb 4.7 oz (113.1 kg) Body mass index is 33.81 kg/m.   Vital Signs:   Temp:  [97.6 F (36.4 C)-97.9 F (36.6 C)] 97.6 F (36.4 C) (02/18 0744) Pulse Rate:  [60-61] 60 (02/18 0744) Resp:  [16] 16 (02/18 0429) BP: (108-150)/(62-81) 150/67 (02/18 0744) SpO2:  [93 %-98 %] 93 % (02/18 0744) Weight:  [249 lb 4.7 oz (113.1 kg)] 249 lb 4.7 oz (113.1 kg) (02/18 0429) Last BM Date: 10/02/17  Weight change: Filed Weights   10/05/17 0534 10/06/17 0454 10/07/17 0429  Weight: 246 lb 9.6 oz (111.9 kg) 249 lb 4.8 oz (113.1 kg) 249 lb 4.7 oz (113.1 kg)    Intake/Output:   Intake/Output Summary (Last 24 hours) at 10/07/2017 0746 Last data filed at 10/07/2017 0438 Gross per 24 hour  Intake 720 ml  Output 1150 ml  Net -430 ml      Physical Exam    General: Elderly appearing. No resp difficulty. HEENT: Normal. O2 via [place] Neck: Supple. JVP 6-7. Carotids 2+ bilat; no bruits. No thyromegaly or nodule noted. Cor: PMI nondisplaced. RRR, No M/G/R noted. Left chest ICD site stable. No hematoma. Lungs: CTAB, normal effort. Abdomen: Soft, non-tender, non-distended, no HSM. No bruits or masses. +BS  Extremities: No cyanosis, clubbing, or rash. R and LLE no edema.  Neuro: Alert & orientedx3, cranial nerves grossly intact. moves all 4 extremities w/o difficulty. Affect pleasant    Telemetry   A-pacing at 60, personally reviewed.   Labs    CBC No results for input(s): WBC, NEUTROABS, HGB, HCT, MCV, PLT in the last 72  hours. Basic Metabolic Panel Recent Labs    10/06/17 0708 10/07/17 0412  NA 137 139  K 3.9 3.6  CL 101 102  CO2 25 26  GLUCOSE 119* 106*  BUN 17 16  CREATININE 1.09 1.15  CALCIUM 9.0 9.0   Liver Function Tests No results for input(s): AST, ALT, ALKPHOS, BILITOT, PROT, ALBUMIN in the last 72 hours. No results for input(s): LIPASE, AMYLASE in the last 72 hours. Cardiac Enzymes No results for input(s): CKTOTAL, CKMB, CKMBINDEX, TROPONINI in the last 72 hours.  BNP: BNP (last 3 results) Recent Labs    07/14/17 1601 08/03/17 1158  BNP 341.8* 318.9*    ProBNP (last 3 results) Recent Labs    08/01/17 1517  PROBNP 1,490*     D-Dimer No results for input(s): DDIMER in the last 72 hours. Hemoglobin A1C No results for input(s): HGBA1C in the last 72 hours. Fasting Lipid Panel No results for input(s): CHOL, HDL, LDLCALC, TRIG, CHOLHDL, LDLDIRECT in the last 72 hours. Thyroid Function Tests No results for input(s): TSH, T4TOTAL, T3FREE, THYROIDAB in the last 72 hours.  Invalid input(s): FREET3  Other results:   Imaging    No results found.   Medications:     Scheduled Medications: . amiodarone  200 mg Oral Daily  . carvedilol  3.125 mg Oral BID WC  . furosemide  40 mg Oral Daily  . gabapentin  300 mg Oral Daily  . metFORMIN  500 mg Oral Q breakfast  . simvastatin  40 mg Oral q1800    Infusions:   PRN Medications: acetaminophen, ondansetron (ZOFRAN) IV  Patient Profile   Dylan [name] is a 84 y.o. male with PMH of PAF, DM, HTN and CHF who presented with weakness, dizziness and near syncope.   Assessment/Plan   1. Sinus bradycardia with near syncope: tachy-brady syndrome.  - 2.5 second pause noted on tele overnight into 2/15.   Day of 2/15, pt had approximately 30 minutes of junctional rhythm in the 30s. EP saw again and recommended PPM - s/p PPM 10/05/16.  - Continue coreg 3.125 mg BID ( now a-paced) - home today.   2. Paroxysmal atrial  fibrillation:  - Currently in NSR - Continue amiodarone but decrease dose to 100 mg daily.   - Continue Eliquis. Resume today.  3. Chronic diastolic CHF: Fairly quiescent while in NSR. TEE 1/19 with EF 55-60%.  - Continue Lasix 40 mg daily for home.   4. Elevated troponin:  - Mild elevation, suspect demand ischemia from hypotension due to bradycardia.   No change.  5. Chronic hypoxic respiratory failure: CXR clear.  ?COPD given long prior smoking history.  - Will need O2 for home with exertion. Sats drop to 80s on RA with ambulation.   Home today with O2.   Length of Stay: 4  Dylan [name]  10/07/2017, 7:46 AM  Advanced Heart Failure Team Pager [phone] (M-F; 7a - 4p)  Please contact [facility] for night-coverage after hours (4p -7a ) and weekends on [email or web address]  Patient seen with PA, agree with the above note.  He is stable today, a-pacing.  Drops oxygen saturation with exertion, CXR clear and not volume overloaded on exam.  Possible COPD (prior smoker but quit years ago).  No complaints, no dyspnea or chest pain.   Now s/p PPM for tachy-brady syndrome.  He can go home today.  Will decrease amiodarone to 100 mg daily.  Continue Coreg 3.125 mg bid.  Continue current Lasix.  He may need additional BP med at home but will decide on that at followup.   Discharge today.  Needs followup with me and with EP (PPM followup).   Meds for discharge: Amiodarone 100 mg daily, Coreg 3.125 mg bid, Lasix 40 mg daily, Zocor 40 mg daily, apixaban 5 mg bid.   Dylan [name] 10/07/2017 9:11 AM

## 2017-10-07 NOTE — Care Management Important Message (Signed)
Important Message  Patient Details  Name: Dylan [name] MRN: [id number] Date of Birth: Jan 18, 1926   Medicare Important Message Given:  Yes    Tyiesha Brackney 10/07/2017, 1:04 PM

## 2017-10-07 NOTE — Care Management Note (Addendum)
Case Management Note  Patient Details  Name: Dylan [name] MRN: [id number] Date of Birth: 05/15/1926  Subjective/Objective:  Pt presented for Bradycardia-near syncope- S/p PPM 10-05-16. PTA from home with wife-plan will be to return home once stable.                  Action/Plan: Referral received for DME: Oxygen- RN to ambulate the patient to see if pt qualifies for DME 02. CM did discuss with patient in regards to Oxygen if qualifies patient/ wife wants to utilize [facility].   Expected Discharge Date:  10/07/17               Expected Discharge Plan:  [place]  In-House Referral:  NA  Discharge planning Services  CM Consult  Post Acute Care Choice:  Durable Medical Equipment Choice offered to:  Patient, Spouse  DME Arranged:  N/A DME Agency: N/A  HH Arranged:  NA HH Agency:  NA  Status of Service:  Completed, signed off  If discussed at [place] of Stay Meetings, dates discussed:    Additional Comments: 1142 10-07-17 Jacqlyn Krauss, RN,BSN [phone] Per Staff RN pt will not qualify for Home 02. No further needs from CM at this time.  Bethena Roys, RN 10/07/2017, 11:13 AM

## 2017-10-14 ENCOUNTER — Encounter (HOSPITAL_COMMUNITY): Payer: Self-pay

## 2017-10-14 ENCOUNTER — Inpatient Hospital Stay (HOSPITAL_COMMUNITY)
Admission: EM | Admit: 2017-10-14 | Discharge: 2017-10-17 | DRG: 194 | Disposition: A | Discharge status: Home Health | Payer: Medicare HMO | Attending: Family Medicine | Admitting: Family Medicine

## 2017-10-14 ENCOUNTER — Emergency Department (HOSPITAL_COMMUNITY): Payer: Medicare HMO

## 2017-10-14 ENCOUNTER — Ambulatory Visit (HOSPITAL_COMMUNITY)
Admission: RE | Admit: 2017-10-14 | Discharge: 2017-10-14 | Disposition: A | Discharge status: Home / Self Care | Payer: Medicare HMO | Source: Ambulatory Visit | Attending: Cardiology | Admitting: Cardiology

## 2017-10-14 ENCOUNTER — Other Ambulatory Visit: Payer: Self-pay

## 2017-10-14 VITALS — BP 96/45 | HR 69 | Wt 253.5 lb

## 2017-10-14 DIAGNOSIS — W1830XA Fall on same level, unspecified, initial encounter: Secondary | ICD-10-CM | POA: Diagnosis present

## 2017-10-14 DIAGNOSIS — Z79899 Other long term (current) drug therapy: Secondary | ICD-10-CM

## 2017-10-14 DIAGNOSIS — Z881 Allergy status to other antibiotic agents status: Secondary | ICD-10-CM

## 2017-10-14 DIAGNOSIS — E871 Hypo-osmolality and hyponatremia: Secondary | ICD-10-CM | POA: Diagnosis present

## 2017-10-14 DIAGNOSIS — J181 Lobar pneumonia, unspecified organism: Principal | ICD-10-CM | POA: Diagnosis present

## 2017-10-14 DIAGNOSIS — E860 Dehydration: Secondary | ICD-10-CM | POA: Diagnosis present

## 2017-10-14 DIAGNOSIS — E1122 Type 2 diabetes mellitus with diabetic chronic kidney disease: Secondary | ICD-10-CM | POA: Diagnosis present

## 2017-10-14 DIAGNOSIS — J189 Pneumonia, unspecified organism: Secondary | ICD-10-CM

## 2017-10-14 DIAGNOSIS — S0011XA Contusion of right eyelid and periocular area, initial encounter: Secondary | ICD-10-CM | POA: Diagnosis not present

## 2017-10-14 DIAGNOSIS — Z7901 Long term (current) use of anticoagulants: Secondary | ICD-10-CM

## 2017-10-14 DIAGNOSIS — Z950 Presence of cardiac pacemaker: Secondary | ICD-10-CM

## 2017-10-14 DIAGNOSIS — I5022 Chronic systolic (congestive) heart failure: Secondary | ICD-10-CM

## 2017-10-14 DIAGNOSIS — R059 Cough, unspecified: Secondary | ICD-10-CM

## 2017-10-14 DIAGNOSIS — I130 Hypertensive heart and chronic kidney disease with heart failure and stage 1 through stage 4 chronic kidney disease, or unspecified chronic kidney disease: Secondary | ICD-10-CM

## 2017-10-14 DIAGNOSIS — H05232 Hemorrhage of left orbit: Secondary | ICD-10-CM

## 2017-10-14 DIAGNOSIS — R05 Cough: Secondary | ICD-10-CM

## 2017-10-14 DIAGNOSIS — Z87891 Personal history of nicotine dependence: Secondary | ICD-10-CM

## 2017-10-14 DIAGNOSIS — S0993XA Unspecified injury of face, initial encounter: Secondary | ICD-10-CM | POA: Diagnosis not present

## 2017-10-14 DIAGNOSIS — Z8249 Family history of ischemic heart disease and other diseases of the circulatory system: Secondary | ICD-10-CM

## 2017-10-14 DIAGNOSIS — I5032 Chronic diastolic (congestive) heart failure: Secondary | ICD-10-CM | POA: Diagnosis present

## 2017-10-14 DIAGNOSIS — S0512XA Contusion of eyeball and orbital tissues, left eye, initial encounter: Secondary | ICD-10-CM | POA: Diagnosis not present

## 2017-10-14 DIAGNOSIS — N183 Chronic kidney disease, stage 3 (moderate): Secondary | ICD-10-CM | POA: Diagnosis present

## 2017-10-14 DIAGNOSIS — I4891 Unspecified atrial fibrillation: Secondary | ICD-10-CM

## 2017-10-14 DIAGNOSIS — Z7984 Long term (current) use of oral hypoglycemic drugs: Secondary | ICD-10-CM

## 2017-10-14 DIAGNOSIS — R918 Other nonspecific abnormal finding of lung field: Secondary | ICD-10-CM | POA: Insufficient documentation

## 2017-10-14 DIAGNOSIS — R0902 Hypoxemia: Secondary | ICD-10-CM | POA: Diagnosis present

## 2017-10-14 DIAGNOSIS — I482 Chronic atrial fibrillation: Secondary | ICD-10-CM | POA: Diagnosis not present

## 2017-10-14 DIAGNOSIS — W19XXXA Unspecified fall, initial encounter: Secondary | ICD-10-CM

## 2017-10-14 DIAGNOSIS — S098XXA Other specified injuries of head, initial encounter: Secondary | ICD-10-CM | POA: Diagnosis not present

## 2017-10-14 DIAGNOSIS — S0990XA Unspecified injury of head, initial encounter: Secondary | ICD-10-CM | POA: Diagnosis not present

## 2017-10-14 DIAGNOSIS — Y95 Nosocomial condition: Secondary | ICD-10-CM | POA: Diagnosis present

## 2017-10-14 DIAGNOSIS — Z9049 Acquired absence of other specified parts of digestive tract: Secondary | ICD-10-CM

## 2017-10-14 DIAGNOSIS — I5033 Acute on chronic diastolic (congestive) heart failure: Secondary | ICD-10-CM | POA: Diagnosis present

## 2017-10-14 DIAGNOSIS — S0083XA Contusion of other part of head, initial encounter: Secondary | ICD-10-CM | POA: Diagnosis not present

## 2017-10-14 DIAGNOSIS — R0602 Shortness of breath: Secondary | ICD-10-CM | POA: Diagnosis not present

## 2017-10-14 DIAGNOSIS — Z96659 Presence of unspecified artificial knee joint: Secondary | ICD-10-CM | POA: Diagnosis present

## 2017-10-14 DIAGNOSIS — E119 Type 2 diabetes mellitus without complications: Secondary | ICD-10-CM

## 2017-10-14 DIAGNOSIS — I480 Paroxysmal atrial fibrillation: Secondary | ICD-10-CM

## 2017-10-14 DIAGNOSIS — Z8701 Personal history of pneumonia (recurrent): Secondary | ICD-10-CM

## 2017-10-14 DIAGNOSIS — S199XXA Unspecified injury of neck, initial encounter: Secondary | ICD-10-CM | POA: Diagnosis not present

## 2017-10-14 DIAGNOSIS — E785 Hyperlipidemia, unspecified: Secondary | ICD-10-CM | POA: Diagnosis present

## 2017-10-14 LAB — COMPREHENSIVE METABOLIC PANEL
ALBUMIN: 3.6 g/dL (ref 3.5–5.0)
ALK PHOS: 83 U/L (ref 38–126)
ALT: 17 U/L (ref 17–63)
ALT: 17 U/L (ref 17–63)
AST: 35 U/L (ref 15–41)
AST: 24 U/L (ref 15–41)
Albumin: 3.7 g/dL (ref 3.5–5.0)
Alkaline Phosphatase: 76 U/L (ref 38–126)
Anion gap: 11 (ref 5–15)
Anion gap: 14 (ref 5–15)
BUN: 13 mg/dL (ref 6–20)
BUN: 15 mg/dL (ref 6–20)
CALCIUM: 8.9 mg/dL (ref 8.9–10.3)
CHLORIDE: 96 mmol/L — AB (ref 101–111)
CO2: 24 mmol/L (ref 22–32)
CO2: 21 mmol/L — ABNORMAL LOW (ref 22–32)
CREATININE: 1.12 mg/dL (ref 0.61–1.24)
Calcium: 8.6 mg/dL — ABNORMAL LOW (ref 8.9–10.3)
Chloride: 96 mmol/L — ABNORMAL LOW (ref 101–111)
Creatinine, Ser: 1.19 mg/dL (ref 0.61–1.24)
GFR calc Af Amer: >60 mL/min (ref >60)
GFR, EST NON AFRICAN AMERICAN: 56 mL/min — AB (ref >60)
GFR, EST NON AFRICAN AMERICAN: 52 mL/min — AB (ref >60)
Glucose, Bld: 96 mg/dL (ref 65–99)
Glucose, Bld: 124 mg/dL — ABNORMAL HIGH (ref 65–99)
POTASSIUM: 3.7 mmol/L (ref 3.5–5.1)
Potassium: 4 mmol/L (ref 3.5–5.1)
SODIUM: 131 mmol/L — AB (ref 135–145)
Sodium: 131 mmol/L — ABNORMAL LOW (ref 135–145)
TOTAL PROTEIN: 7.1 g/dL (ref 6.5–8.1)
Total Bilirubin: 2.2 mg/dL — ABNORMAL HIGH (ref 0.3–1.2)
Total Bilirubin: 1.9 mg/dL — ABNORMAL HIGH (ref 0.3–1.2)
Total Protein: 6.7 g/dL (ref 6.5–8.1)

## 2017-10-14 LAB — CBC WITH DIFFERENTIAL/PLATELET
Basophils Absolute: 0.1 10*3/uL (ref 0.0–0.1)
Basophils Relative: 1 %
EOS PCT: 0 %
Eosinophils Absolute: 0 10*3/uL (ref 0.0–0.7)
HCT: 39.2 % (ref 39.0–52.0)
Hemoglobin: 13.1 g/dL (ref 13.0–17.0)
LYMPHS ABS: 1.2 10*3/uL (ref 0.7–4.0)
LYMPHS PCT: 19 %
MCH: 30.5 pg (ref 26.0–34.0)
MCHC: 33.4 g/dL (ref 30.0–36.0)
MCV: 91.4 fL (ref 78.0–100.0)
Monocytes Absolute: 0.5 10*3/uL (ref 0.1–1.0)
Monocytes Relative: 8 %
Neutro Abs: 4.6 10*3/uL (ref 1.7–7.7)
Neutrophils Relative %: 72 %
PLATELETS: 114 10*3/uL — AB (ref 150–400)
RBC: 4.29 MIL/uL (ref 4.22–5.81)
RDW: 13.2 % (ref 11.5–15.5)
WBC: 6.3 10*3/uL (ref 4.0–10.5)

## 2017-10-14 LAB — CBC
HCT: 41 % (ref 39.0–52.0)
Hemoglobin: 13.9 g/dL (ref 13.0–17.0)
MCH: 31.7 pg (ref 26.0–34.0)
MCHC: 33.9 g/dL (ref 30.0–36.0)
MCV: 93.4 fL (ref 78.0–100.0)
PLATELETS: 124 10*3/uL — AB (ref 150–400)
RBC: 4.39 MIL/uL (ref 4.22–5.81)
RDW: 13.2 % (ref 11.5–15.5)
WBC: 5.8 10*3/uL (ref 4.0–10.5)

## 2017-10-14 LAB — I-STAT CG4 LACTIC ACID, ED: LACTIC ACID, VENOUS: 1.19 mmol/L (ref 0.5–1.9)

## 2017-10-14 MED ORDER — FUROSEMIDE 20 MG PO TABS: 20.0000 mg | ORAL_TABLET | Freq: Every day | ORAL | 3 refills | Status: DC

## 2017-10-14 MED ORDER — SODIUM CHLORIDE 0.9 % IV SOLN
1000.0000 mL | INTRAVENOUS | Status: DC
  Administered 2017-10-14: 1000 mL via INTRAVENOUS

## 2017-10-14 MED ORDER — LEVOFLOXACIN IN D5W 750 MG/150ML IV SOLN
750.0000 mg | Freq: Once | INTRAVENOUS | Status: AC
  Administered 2017-10-14: 750 mg via INTRAVENOUS
  Filled 2017-10-14: qty 150

## 2017-10-14 MED ORDER — MORPHINE SULFATE (PF) 4 MG/ML IV SOLN
4.0000 mg | Freq: Once | INTRAVENOUS | Status: AC
  Administered 2017-10-14: 4 mg via INTRAVENOUS
  Filled 2017-10-14: qty 1

## 2017-10-14 MED ORDER — VANCOMYCIN HCL IN DEXTROSE 1-5 GM/200ML-% IV SOLN: 1000.0000 mg | Freq: Once | INTRAVENOUS | Status: DC

## 2017-10-14 MED ORDER — VANCOMYCIN HCL 10 G IV SOLR
1250.0000 mg | INTRAVENOUS | Status: DC
  Administered 2017-10-16 – 2017-10-17 (×2): 1250 mg via INTRAVENOUS
  Filled 2017-10-14 (×2): qty 1250

## 2017-10-14 MED ORDER — VANCOMYCIN HCL 10 G IV SOLR
2000.0000 mg | Freq: Once | INTRAVENOUS | Status: AC
  Administered 2017-10-15: 2000 mg via INTRAVENOUS
  Filled 2017-10-14: qty 2000

## 2017-10-14 MED ORDER — LEVOFLOXACIN IN D5W 500 MG/100ML IV SOLN: 500.0000 mg | INTRAVENOUS | Status: DC

## 2017-10-14 MED ORDER — AMIODARONE HCL 200 MG PO TABS: 200.0000 mg | ORAL_TABLET | Freq: Every day | ORAL | 6 refills | Status: DC

## 2017-10-14 NOTE — ED Provider Notes (Signed)
[facility] EMERGENCY DEPARTMENT Provider Note   CSN: [id number] Arrival date & time: 10/14/17  1836     History   Chief Complaint Chief Complaint  Patient presents with  . Fall    HPI Dylan [name] is a 84 y.o. male.  HPI  The patient is an 84 year old male, he has a history of hyperglycemia, hypertension, he also has a recent history of a pacemaker placement in the last couple of weeks.  He had a fall today when he was at home, seem to lose his balance and fell to the ground, he then had a second fall when he was at a store at the pharmacy, he was trying to hurry to get to the bathroom because he had to urinate, lost his balance and fell to the ground striking the right side of his forehead in the periorbital area.  There was no loss of consciousness, he was able to get up off the ground, his wife took him home and placed an ice pack on his eye but because of persistent swelling (on Eliquis) she brought him to the hospital.  He does not have any significant headache, he does not have any nausea vomiting, blurry vision.  His right eyelid is swollen enough that he cannot open the right eye.  No difficulty using the arms or the legs, no chest pain, no shortness of breath, he did not injure the area of the pacemaker insertion.  Past Medical History:  Diagnosis Date  . Hyperglycemia   . Hyperlipidemia   . Hypertension   . Lung nodule     Patient Active Problem List   Diagnosis Date Noted  . HCAP (healthcare-associated pneumonia) 10/14/2017  . Bradycardia 10/03/2017  . Acute CHF (congestive heart failure) ([place]) 08/03/2017  . Atrial fibrillation ([place]) 07/14/2017  . Diastolic dysfunction with acute on chronic heart failure ([place]) 07/14/2017  . DOE (dyspnea on exertion) 08/24/2014  . Benign essential HTN 08/24/2014  . DM II (diabetes mellitus, type II), controlled ([place]) 08/24/2014  . Dyslipidemia 08/24/2014    Past Surgical History:  Procedure Laterality Date    . APPENDECTOMY  1956  . BACK SURGERY  2008  . CARDIAC CATHETERIZATION  1995   negative results  . CARDIOVERSION N/A 08/05/2017   Procedure: CARDIOVERSION;  Surgeon: Larey Dresser, MD;  Location: [facility] ENDOSCOPY;  Service: Cardiovascular;  Laterality: N/A;  . CARDIOVERSION N/A 09/12/2017   Procedure: CARDIOVERSION;  Surgeon: Larey Dresser, MD;  Location: [facility] ENDOSCOPY;  Service: Cardiovascular;  Laterality: N/A;  . COLON SURGERY  07/09/12  . [place]  . HOT HEMOSTASIS  01/08/2012   Procedure: HOT HEMOSTASIS (ARGON PLASMA COAGULATION/BICAP);  Surgeon: Arta Silence, MD;  Location: [facility] ENDOSCOPY;  Service: Endoscopy;  Laterality: N/A;  . LAPAROSCOPIC ILEOCECECTOMY  07/09/2012   Procedure: LAPAROSCOPIC ILEOCECECTOMY;  Surgeon: Edward Jolly, MD;  Location: [facility] ORS;  Service: General;  Laterality: N/A;  Laparoscopic Ileocecectomy  . PACEMAKER IMPLANT N/A 10/05/2017   Procedure: PACEMAKER IMPLANT;  Surgeon: Deboraha Sprang, MD;  Location: [facility] CV LAB;  Service: Cardiovascular;  Laterality: N/A;  . REPLACEMENT TOTAL KNEE  2010  . TEE WITHOUT CARDIOVERSION N/A 08/05/2017   Procedure: TRANSESOPHAGEAL ECHOCARDIOGRAM (TEE);  Surgeon: Larey Dresser, MD;  Location: [facility] ENDOSCOPY;  Service: Cardiovascular;  Laterality: N/A;  . TEE WITHOUT CARDIOVERSION N/A 09/12/2017   Procedure: TRANSESOPHAGEAL ECHOCARDIOGRAM (TEE);  Surgeon: Larey Dresser, MD;  Location: [facility] ENDOSCOPY;  Service: Cardiovascular;  Laterality: N/A;  Home Medications    Prior to Admission medications   Medication Sig Start Date End Date Taking? Authorizing Provider  amiodarone (PACERONE) 200 MG tablet Take 1 tablet (200 mg total) by mouth daily. 10/14/17  Yes Larey Dresser, MD  apixaban (ELIQUIS) 5 MG TABS tablet Take 1 tablet (5 mg total) by mouth 2 (two) times daily. 08/26/17  Yes Larey Dresser, MD  carvedilol (COREG) 3.125 MG tablet Take 1 tablet (3.125 mg total) by mouth 2 (two) times daily with  a meal. 09/17/17  Yes Larey Dresser, MD  Cholecalciferol (VITAMIN D) 2000 units CAPS Take 2,000 Units by mouth daily.    Yes [provider]  furosemide (LASIX) 20 MG tablet Take 1 tablet (20 mg total) by mouth daily. 10/14/17  Yes Larey Dresser, MD  gabapentin (NEURONTIN) 300 MG capsule Take 300 mg by mouth daily.    Yes [provider]  guaiFENesin (ROBITUSSIN) 100 MG/5ML liquid Take 400 mg by mouth 4 (four) times daily as needed for cough.   Yes [provider]  metFORMIN (GLUCOPHAGE-XR) 500 MG 24 hr tablet Take 500 mg by mouth daily with breakfast.   Yes [provider]  Omega-3 Fatty Acids (FISH OIL) 1200 MG CAPS Take 1,200 mg by mouth 2 (two) times daily.    Yes [provider]  pseudoephedrine-guaifenesin (MUCINEX D) 60-600 MG 12 hr tablet Take 2 tablets by mouth every 12 (twelve) hours.    Yes [provider]  simvastatin (ZOCOR) 40 MG tablet Take 40 mg by mouth daily at 6 PM.    Yes [provider]  vitamin B-12 (CYANOCOBALAMIN) 1000 MCG tablet Take 1,000 mcg by mouth daily.   Yes [provider]    Family History Family History  Problem Relation Age of Onset  . Cancer - Other Mother   . CVA Father   . Heart attack Brother   . Heart attack Maternal Grandmother     Social History Social History   Tobacco Use  . Smoking status: Former Smoker    Packs/day: 1.00    Types: Cigarettes    Last attempt to quit: 01/07/1992    Years since quitting: 25.7  . Smokeless tobacco: Never Used  Substance Use Topics  . Alcohol use: Yes    Alcohol/week: 1.2 oz    Types: 2 Glasses of wine per week  . Drug use: No     Allergies   Keflex [cephalexin]   Review of Systems Review of Systems  All other systems reviewed and are negative.    Physical Exam Updated Vital Signs BP 117/75   Pulse 90   Temp 98.1 F (36.7 C) (Oral)   Resp (!) 24   SpO2 96%   Physical Exam  Constitutional: He appears  well-developed and well-nourished. No distress.  HENT:  Head: Normocephalic.  Mouth/Throat: Oropharynx is clear and moist. No oropharyngeal exudate.  Very orbital hematoma on the right, significant swelling of the upper lid.  No malocclusion, no hemotympanum, no battle sign.  Eyes: Conjunctivae and EOM are normal. Pupils are equal, round, and reactive to light. Right eye exhibits no discharge. Left eye exhibits no discharge. No scleral icterus.  Neck: Normal range of motion. Neck supple. No JVD present. No thyromegaly present.  Cardiovascular: Normal rate, regular rhythm, normal heart sounds and intact distal pulses. Exam reveals no gallop and no friction rub.  No murmur heard. There is no tenderness over the pacemaker site.  Pulmonary/Chest: Effort normal and breath sounds  normal. No respiratory distress. He has no wheezes. He has no rales.  Abdominal: Soft. Bowel sounds are normal. He exhibits no distension and no mass. There is no tenderness.  Musculoskeletal: Normal range of motion. He exhibits no edema or tenderness.  Lymphadenopathy:    He has no cervical adenopathy.  Neurological: He is alert. Coordination normal.  The patient has the ability to lift both arms, lift both legs, he is able to speak with normal speech and memory.  His left eye appears normal with normal pupillary exam.  Right eye is barely seen through his swollen lids and appears to have a normal pupil.  Skin: Skin is warm and dry. No rash noted. No erythema.  Psychiatric: He has a normal mood and affect. His behavior is normal.  Nursing note and vitals reviewed.    ED Treatments / Results  Labs (all labs ordered are listed, but only abnormal results are displayed) Labs Reviewed  COMPREHENSIVE METABOLIC PANEL - Abnormal; Notable for the following components:      Result Value   Sodium 131 (*)    Chloride 96 (*)    CO2 21 (*)    Glucose, Bld 124 (*)    Calcium 8.6 (*)    Total Bilirubin 1.9 (*)    GFR calc non  Af Amer 52 (*)    All other components within normal limits  CBC WITH DIFFERENTIAL/PLATELET - Abnormal; Notable for the following components:   Platelets 114 (*)    All other components within normal limits  CULTURE, BLOOD (ROUTINE X 2)  CULTURE, BLOOD (ROUTINE X 2)  I-STAT CG4 LACTIC ACID, ED  I-STAT CG4 LACTIC ACID, ED     Radiology Dg Chest 2 View  Result Date: 10/14/2017 CLINICAL DATA:  Cough and shortness of breath. EXAM: CHEST  2 VIEW COMPARISON:  Chest x-ray dated October 06, 2017. FINDINGS: Unchanged left chest wall AICD. Stable mild cardiomegaly. Normal pulmonary vascularity. New subtle patchy density in the left lower lobe. No pleural effusion or pneumothorax. No acute osseous abnormality. IMPRESSION: New subtle patchy opacity in the left lower lobe, concerning for pneumonia. Electronically Signed   By: Titus Dubin M.D.   On: 10/14/2017 18:35   Ct Head Wo Contrast  Result Date: 10/14/2017 CLINICAL DATA:  Fall.  Swelling over right orbit EXAM: CT HEAD WITHOUT CONTRAST CT MAXILLOFACIAL WITHOUT CONTRAST CT CERVICAL SPINE WITHOUT CONTRAST TECHNIQUE: Multidetector CT imaging of the head, cervical spine, and maxillofacial structures were performed using the standard protocol without intravenous contrast. Multiplanar CT image reconstructions of the cervical spine and maxillofacial structures were also generated. COMPARISON:  10/04/2016 FINDINGS: CT HEAD FINDINGS Brain: There is atrophy and chronic small vessel disease changes. No acute intracranial abnormality. Specifically, no hemorrhage, hydrocephalus, mass lesion, acute infarction, or significant intracranial injury. Vascular: No hyperdense vessel or unexpected calcification. Skull: No acute calvarial abnormality. Other: Marked soft tissue swelling over the right orbit and forehead. No underlying bony abnormality. CT MAXILLOFACIAL FINDINGS Osseous: No fracture or mandibular dislocation. No destructive process. Orbits: Negative. No  traumatic or inflammatory finding. Sinuses: Mucosal thickening throughout the paranasal sinuses. No air-fluid levels. Mastoid air cells are clear. Prior mastoidectomy on the right. Soft tissues: Marked soft tissue swelling over the right orbit and forehead. Globes are intact. CT CERVICAL SPINE FINDINGS Alignment: Loss of cervical lordosis.  No subluxation. Skull base and vertebrae: No fracture Soft tissues and spinal canal: Prevertebral soft tissues are normal. No epidural or paraspinal hematoma. Disc levels: Advanced degenerative disc and diffuse  degenerative facet disease throughout the cervical spine. Upper chest: No acute findings. Other: No acute findings IMPRESSION: No acute intracranial abnormality. Atrophy, chronic microvascular disease. No evidence of facial, orbital, or cervical spine fracture. Marked soft tissue swelling over the right orbit and forehead. Chronic sinusitis. Advanced degenerative disc and facet disease in the cervical spine. Loss of cervical lordosis, likely degenerative in nature. No fracture or subluxation. Electronically Signed   By: Rolm Baptise M.D.   On: 10/14/2017 20:22   Ct Cervical Spine Wo Contrast  Result Date: 10/14/2017 CLINICAL DATA:  Fall.  Swelling over right orbit EXAM: CT HEAD WITHOUT CONTRAST CT MAXILLOFACIAL WITHOUT CONTRAST CT CERVICAL SPINE WITHOUT CONTRAST TECHNIQUE: Multidetector CT imaging of the head, cervical spine, and maxillofacial structures were performed using the standard protocol without intravenous contrast. Multiplanar CT image reconstructions of the cervical spine and maxillofacial structures were also generated. COMPARISON:  10/04/2016 FINDINGS: CT HEAD FINDINGS Brain: There is atrophy and chronic small vessel disease changes. No acute intracranial abnormality. Specifically, no hemorrhage, hydrocephalus, mass lesion, acute infarction, or significant intracranial injury. Vascular: No hyperdense vessel or unexpected calcification. Skull: No acute  calvarial abnormality. Other: Marked soft tissue swelling over the right orbit and forehead. No underlying bony abnormality. CT MAXILLOFACIAL FINDINGS Osseous: No fracture or mandibular dislocation. No destructive process. Orbits: Negative. No traumatic or inflammatory finding. Sinuses: Mucosal thickening throughout the paranasal sinuses. No air-fluid levels. Mastoid air cells are clear. Prior mastoidectomy on the right. Soft tissues: Marked soft tissue swelling over the right orbit and forehead. Globes are intact. CT CERVICAL SPINE FINDINGS Alignment: Loss of cervical lordosis.  No subluxation. Skull base and vertebrae: No fracture Soft tissues and spinal canal: Prevertebral soft tissues are normal. No epidural or paraspinal hematoma. Disc levels: Advanced degenerative disc and diffuse degenerative facet disease throughout the cervical spine. Upper chest: No acute findings. Other: No acute findings IMPRESSION: No acute intracranial abnormality. Atrophy, chronic microvascular disease. No evidence of facial, orbital, or cervical spine fracture. Marked soft tissue swelling over the right orbit and forehead. Chronic sinusitis. Advanced degenerative disc and facet disease in the cervical spine. Loss of cervical lordosis, likely degenerative in nature. No fracture or subluxation. Electronically Signed   By: Rolm Baptise M.D.   On: 10/14/2017 20:22   Ct Maxillofacial Wo Contrast  Result Date: 10/14/2017 CLINICAL DATA:  Fall.  Swelling over right orbit EXAM: CT HEAD WITHOUT CONTRAST CT MAXILLOFACIAL WITHOUT CONTRAST CT CERVICAL SPINE WITHOUT CONTRAST TECHNIQUE: Multidetector CT imaging of the head, cervical spine, and maxillofacial structures were performed using the standard protocol without intravenous contrast. Multiplanar CT image reconstructions of the cervical spine and maxillofacial structures were also generated. COMPARISON:  10/04/2016 FINDINGS: CT HEAD FINDINGS Brain: There is atrophy and chronic small vessel  disease changes. No acute intracranial abnormality. Specifically, no hemorrhage, hydrocephalus, mass lesion, acute infarction, or significant intracranial injury. Vascular: No hyperdense vessel or unexpected calcification. Skull: No acute calvarial abnormality. Other: Marked soft tissue swelling over the right orbit and forehead. No underlying bony abnormality. CT MAXILLOFACIAL FINDINGS Osseous: No fracture or mandibular dislocation. No destructive process. Orbits: Negative. No traumatic or inflammatory finding. Sinuses: Mucosal thickening throughout the paranasal sinuses. No air-fluid levels. Mastoid air cells are clear. Prior mastoidectomy on the right. Soft tissues: Marked soft tissue swelling over the right orbit and forehead. Globes are intact. CT CERVICAL SPINE FINDINGS Alignment: Loss of cervical lordosis.  No subluxation. Skull base and vertebrae: No fracture Soft tissues and spinal canal: Prevertebral soft tissues are normal. No  epidural or paraspinal hematoma. Disc levels: Advanced degenerative disc and diffuse degenerative facet disease throughout the cervical spine. Upper chest: No acute findings. Other: No acute findings IMPRESSION: No acute intracranial abnormality. Atrophy, chronic microvascular disease. No evidence of facial, orbital, or cervical spine fracture. Marked soft tissue swelling over the right orbit and forehead. Chronic sinusitis. Advanced degenerative disc and facet disease in the cervical spine. Loss of cervical lordosis, likely degenerative in nature. No fracture or subluxation. Electronically Signed   By: Rolm Baptise M.D.   On: 10/14/2017 20:22   Procedures Procedures (including critical care time)  Medications Ordered in ED Medications  0.9 %  sodium chloride infusion (1,000 mLs Intravenous New Bag/Given 10/14/17 2257)  levofloxacin (LEVAQUIN) IVPB 750 mg (750 mg Intravenous New Bag/Given 10/14/17 2256)  vancomycin (VANCOCIN) 2,000 mg in sodium chloride 0.9 % 500 mL IVPB (not  administered)  levofloxacin (LEVAQUIN) IVPB 500 mg (not administered)  vancomycin (VANCOCIN) 1,250 mg in sodium chloride 0.9 % 250 mL IVPB (not administered)  morphine 4 MG/ML injection 4 mg (4 mg Intravenous Given 10/14/17 2001)     Initial Impression / Assessment and Plan / ED Course  I have reviewed the triage vital signs and the nursing notes.  Pertinent labs & imaging results that were available during my care of the patient were reviewed by me and considered in my medical decision making (see chart for details).    Imaging will be obtained of the head, the maxillofacial structures and the cervical spine.  The patient will need an ice pack for the bleeding, there is no laceration.  Need a follow-up ophthalmologic exam when swelling goes down.  CT scans are negative for intracranial hemorrhage, spinal fracture or maxillofacial fracture.  The patient has had an ice pack for the swelling.  Unfortunately he has maintained an oxygen saturation between 88 and 90%, he does have some abnormal lung sounds and a chest x-ray done earlier in the day showed a left sided infiltrate.  Labs have been ordered, the patient will get antibiotics and will be admitted to the hospital since he has been off balance, dizzy, generally weak and hypoxic on room air not on home oxygen.  D/w Dr. Hal Hope - will admit for pneumonia with new oxygen requirement.  Final Clinical Impressions(s) / ED Diagnoses   Final diagnoses:  Community acquired pneumonia of left lung, unspecified part of lung  Periorbital hematoma of left eye      Noemi Chapel, MD 10/14/17 2338

## 2017-10-14 NOTE — ED Triage Notes (Addendum)
[facility]- pt coming from home after he suffered a fall. Pt has a hematoma above his right eye. Fall X2 today. Pt takes eloquis. Pt alert and oriented, no LOC. Pt denies pain. Pt had pacemaker placed last week and followed up with cards today.

## 2017-10-14 NOTE — Progress Notes (Signed)
Pharmacy Antibiotic Note  Dylan [name] is a 84 y.o. male admitted on 10/14/2017 with pneumonia.  Pharmacy has been consulted for vancomycin and levofloxacin dosing.  Plan: - levofloxacin 750 mg IV x 1 - levofloxacin 500 mg IV every 24 hours  - vancomycin 2 GM load x 1 - vancomycin 1250 mg every 24 hours - goal trough 15-20 mcg/mL  - monitor clinical progression, renal function, and trough as needed   Temp (24hrs), Avg:98.1 F (36.7 C), Min:98.1 F (36.7 C), Max:98.1 F (36.7 C)  Recent Labs  Lab 10/14/17 1221  WBC 5.8  CREATININE 1.12    Estimated Creatinine Clearance: 58.6 mL/min (by C-G formula based on SCr of 1.12 mg/dL).    Allergies  Allergen Reactions  . Keflex [Cephalexin] Hives and Other (See Comments)    Occurred in the 1980's    Antimicrobials this admission: Levofloxacin 2/25 >>  Vancomycin 2/25>>   Dose adjustments this admission: N/A  Microbiology results: 2/25 BCx: pending  Thank you for allowing pharmacy to be a part of this patient's care.  Deboraha Sprang, PharmD 10/14/2017 9:43 PM

## 2017-10-14 NOTE — Patient Instructions (Signed)
Chest X-Ray today  Take Robitussin and stop Mucinex   Hold Furosemide for 2 days  Then take 20 mg (1 tab) daily  Increase Amiodarone 200 mg (1 tab) daily  Labs drawn today (if we do not call you, then your lab work was stable)   Your physician recommends that you schedule a follow-up appointment in: 1 week with RN visit  Your physician recommends that you schedule a follow-up appointment in: 3 weeks with Dr. Aundra Dubin

## 2017-10-14 NOTE — ED Notes (Signed)
Attempted to ambulate pt.  Uses a walker at home.  Pt had a shuffling gait and dizziness at doorway of room. Returned to bed.  Pt given water per EDP.

## 2017-10-15 ENCOUNTER — Encounter (HOSPITAL_COMMUNITY): Payer: Self-pay | Admitting: Internal Medicine

## 2017-10-15 ENCOUNTER — Other Ambulatory Visit: Payer: Self-pay

## 2017-10-15 ENCOUNTER — Observation Stay (HOSPITAL_COMMUNITY): Payer: Medicare HMO

## 2017-10-15 DIAGNOSIS — M25552 Pain in left hip: Secondary | ICD-10-CM | POA: Diagnosis not present

## 2017-10-15 DIAGNOSIS — W19XXXA Unspecified fall, initial encounter: Secondary | ICD-10-CM

## 2017-10-15 DIAGNOSIS — H05232 Hemorrhage of left orbit: Secondary | ICD-10-CM

## 2017-10-15 DIAGNOSIS — S3993XA Unspecified injury of pelvis, initial encounter: Secondary | ICD-10-CM | POA: Diagnosis not present

## 2017-10-15 DIAGNOSIS — J189 Pneumonia, unspecified organism: Secondary | ICD-10-CM | POA: Diagnosis not present

## 2017-10-15 LAB — BASIC METABOLIC PANEL
Anion gap: 14 (ref 5–15)
BUN: 15 mg/dL (ref 6–20)
CO2: 22 mmol/L (ref 22–32)
Calcium: 8.2 mg/dL — ABNORMAL LOW (ref 8.9–10.3)
Chloride: 96 mmol/L — ABNORMAL LOW (ref 101–111)
Creatinine, Ser: 1.09 mg/dL (ref 0.61–1.24)
GFR calc Af Amer: >60 mL/min (ref >60)
GFR calc non Af Amer: 58 mL/min — ABNORMAL LOW (ref >60)
Glucose, Bld: 93 mg/dL (ref 65–99)
Potassium: 3.6 mmol/L (ref 3.5–5.1)
Sodium: 132 mmol/L — ABNORMAL LOW (ref 135–145)

## 2017-10-15 LAB — GLUCOSE, CAPILLARY
GLUCOSE-CAPILLARY: 110 mg/dL — AB (ref 65–99)
GLUCOSE-CAPILLARY: 138 mg/dL — AB (ref 65–99)
Glucose-Capillary: 117 mg/dL — ABNORMAL HIGH (ref 65–99)
Glucose-Capillary: 105 mg/dL — ABNORMAL HIGH (ref 65–99)

## 2017-10-15 LAB — CBC
HCT: 37.4 % — ABNORMAL LOW (ref 39.0–52.0)
HEMOGLOBIN: 12.4 g/dL — AB (ref 13.0–17.0)
MCH: 30.7 pg (ref 26.0–34.0)
MCHC: 33.2 g/dL (ref 30.0–36.0)
MCV: 92.6 fL (ref 78.0–100.0)
PLATELETS: 111 10*3/uL — AB (ref 150–400)
RBC: 4.04 MIL/uL — ABNORMAL LOW (ref 4.22–5.81)
RDW: 13.2 % (ref 11.5–15.5)
WBC: 6 10*3/uL (ref 4.0–10.5)

## 2017-10-15 LAB — HEMOGLOBIN AND HEMATOCRIT, BLOOD
HCT: 35.3 % — ABNORMAL LOW (ref 39.0–52.0)
Hemoglobin: 12 g/dL — ABNORMAL LOW (ref 13.0–17.0)

## 2017-10-15 LAB — INFLUENZA PANEL BY PCR (TYPE A & B)
INFLAPCR: NEGATIVE
Influenza B By PCR: NEGATIVE

## 2017-10-15 LAB — TROPONIN I: Troponin I: <0.03 ng/mL (ref <0.03)

## 2017-10-15 LAB — STREP PNEUMONIAE URINARY ANTIGEN: Strep Pneumo Urinary Antigen: NEGATIVE

## 2017-10-15 MED ORDER — ONDANSETRON HCL 4 MG/2ML IJ SOLN: 4.0000 mg | Freq: Four times a day (QID) | INTRAMUSCULAR | Status: DC | PRN

## 2017-10-15 MED ORDER — ONDANSETRON HCL 4 MG PO TABS: 4.0000 mg | ORAL_TABLET | Freq: Four times a day (QID) | ORAL | Status: DC | PRN

## 2017-10-15 MED ORDER — SODIUM CHLORIDE 0.9 % IV SOLN
2.0000 g | Freq: Three times a day (TID) | INTRAVENOUS | Status: DC
  Administered 2017-10-15 – 2017-10-16 (×4): 2 g via INTRAVENOUS
  Filled 2017-10-15 (×5): qty 2

## 2017-10-15 MED ORDER — APIXABAN 5 MG PO TABS
5.0000 mg | ORAL_TABLET | Freq: Two times a day (BID) | ORAL | Status: DC
  Administered 2017-10-15 – 2017-10-17 (×4): 5 mg via ORAL
  Filled 2017-10-15 (×4): qty 1

## 2017-10-15 MED ORDER — ACETAMINOPHEN 650 MG RE SUPP: 650.0000 mg | Freq: Four times a day (QID) | RECTAL | Status: DC | PRN

## 2017-10-15 MED ORDER — INSULIN ASPART 100 UNIT/ML ~~LOC~~ SOLN
0.0000 [IU] | Freq: Three times a day (TID) | SUBCUTANEOUS | Status: DC
  Administered 2017-10-16: 1 [IU] via SUBCUTANEOUS

## 2017-10-15 MED ORDER — OMEGA-3-ACID ETHYL ESTERS 1 G PO CAPS
1000.0000 mg | ORAL_CAPSULE | Freq: Every day | ORAL | Status: DC
  Administered 2017-10-15 – 2017-10-17 (×3): 1000 mg via ORAL
  Filled 2017-10-15 (×3): qty 1

## 2017-10-15 MED ORDER — AMIODARONE HCL 200 MG PO TABS
200.0000 mg | ORAL_TABLET | Freq: Every day | ORAL | Status: DC
  Administered 2017-10-15 – 2017-10-17 (×3): 200 mg via ORAL
  Filled 2017-10-15 (×3): qty 1

## 2017-10-15 MED ORDER — CARVEDILOL 3.125 MG PO TABS
3.1250 mg | ORAL_TABLET | Freq: Two times a day (BID) | ORAL | Status: DC
  Administered 2017-10-15 – 2017-10-16 (×4): 3.125 mg via ORAL
  Filled 2017-10-15 (×3): qty 1

## 2017-10-15 MED ORDER — GABAPENTIN 300 MG PO CAPS
300.0000 mg | ORAL_CAPSULE | Freq: Every day | ORAL | Status: DC
  Administered 2017-10-15 – 2017-10-17 (×3): 300 mg via ORAL
  Filled 2017-10-15 (×3): qty 1

## 2017-10-15 MED ORDER — GUAIFENESIN 100 MG/5ML PO SOLN
400.0000 mg | Freq: Four times a day (QID) | ORAL | Status: DC | PRN
  Administered 2017-10-15: 400 mg via ORAL
  Filled 2017-10-15: qty 15
  Filled 2017-10-15: qty 5

## 2017-10-15 MED ORDER — SIMVASTATIN 40 MG PO TABS
40.0000 mg | ORAL_TABLET | Freq: Every day | ORAL | Status: DC
  Administered 2017-10-15: 40 mg via ORAL
  Filled 2017-10-15: qty 1

## 2017-10-15 MED ORDER — FUROSEMIDE 20 MG PO TABS
20.0000 mg | ORAL_TABLET | Freq: Every day | ORAL | Status: DC
  Administered 2017-10-15 – 2017-10-16 (×2): 20 mg via ORAL
  Filled 2017-10-15 (×2): qty 1

## 2017-10-15 MED ORDER — ACETAMINOPHEN 325 MG PO TABS: 650.0000 mg | ORAL_TABLET | Freq: Four times a day (QID) | ORAL | Status: DC | PRN

## 2017-10-15 MED ORDER — VITAMIN B-12 1000 MCG PO TABS
1000.0000 ug | ORAL_TABLET | Freq: Every day | ORAL | Status: DC
  Administered 2017-10-15 – 2017-10-17 (×3): 1000 ug via ORAL
  Filled 2017-10-15 (×3): qty 1

## 2017-10-15 NOTE — Progress Notes (Signed)
PCP: Dr. Maudie Mercury Cardiology: Dr. Aundra Dubin  84 y.o. with history of DM and HTN as well as atrial fibrillation and CHF presents for followup of CHF and atrial fibrillation.   Patient was doing well until 11/18 when he developed exertional dyspnea.  He went to the ER 07/14/17 and was found to be in atrial fibrillation with volume overload.  He was admitted overnight, diuresed and started on Eliquis, and discharged.  He remained in atrial fibrillation and short of breath.   He was re-hospitalized with dyspnea in 12/18.  TEE-guided DCCV to NSR was done and he was diuresed. TEE showed EF 50%.    He was noted to be back in atrial fibrillation and was started on amiodarone.  TEE-guided DCCV in 1/19 was done, with conversion back to NSR.   He was admitted in 2/19 with syncope and found to have tachy-brady syndrome with junctional bradycardia (symptomatic). Medtronic dual chamber PPM was placed.   He returns today for followup of atrial fibrillation and diastolic HF.  On Saturday, he developed a sore throat and cough.  No definite fever.  He started taking Mucinex-D.  He has not been eating or drinking well and notes lightheadedness with standing.  He is not short of breath walking around house or walking into the office today.  No chest pain.  He was not orthostatic when checked in the office today but SBP around 100 mmHg. He is in atrial fibrillation today.   ECG (personally reviewed): atrial fibrillation at 92   PMH: 1. Chronic diastolic CHF: Echo (61/44) with EF 50-55%, mild-moderate LVH, mild AI, mild MR, normal RV size and systolic function, PASP 45 mmHg.  - TEE (12/18): EF 50%, normal RV size and systolic function, mild MR.  - TEE (1/19): EF 55-60%, mild LVH, mild MR, normal RV, aortic sclerosis without significant stenosis.  2. Atrial fibrillation: Persistent, initially diagnosed in 11/18.  - TEE-guided DCCV in 12/18.  - TEE-guided DCCV in 1/19 on amiodarone.  3. Chest pain: LHC remotely without  obstructive disease.  1/16 Cardiolite with no ischemia.  4. Type II diabetes 5. HTN 6. CKD: Stage 3.  7. Tachy-brady syndrome: Medtronic dual chamber.    Social History   Socioeconomic History  . Marital status: Married    Spouse name: Not on file  . Number of children: Not on file  . Years of education: Not on file  . Highest education level: Not on file  Social Needs  . Financial resource strain: Not on file  . Food insecurity - worry: Not on file  . Food insecurity - inability: Not on file  . Transportation needs - medical: Not on file  . Transportation needs - non-medical: Not on file  Occupational History  . Not on file  Tobacco Use  . Smoking status: Former Smoker    Packs/day: 1.00    Types: Cigarettes    Last attempt to quit: 01/07/1992    Years since quitting: 25.7  . Smokeless tobacco: Never Used  Substance and Sexual Activity  . Alcohol use: Yes    Alcohol/week: 1.2 oz    Types: 2 Glasses of wine per week  . Drug use: No  . Sexual activity: Yes  Other Topics Concern  . Not on file  Social History Narrative  . Not on file   Family History  Problem Relation Age of Onset  . Cancer - Other Mother   . CVA Father   . Heart attack Brother   . Heart attack  Maternal Grandmother    ROS: All systems reviewed and negative except as per HPI.  No current facility-administered medications for this encounter.    Current Outpatient Medications  Medication Sig Dispense Refill  . amiodarone (PACERONE) 200 MG tablet Take 1 tablet (200 mg total) by mouth daily. 30 tablet 6  . apixaban (ELIQUIS) 5 MG TABS tablet Take 1 tablet (5 mg total) by mouth 2 (two) times daily. 60 tablet 3  . carvedilol (COREG) 3.125 MG tablet Take 1 tablet (3.125 mg total) by mouth 2 (two) times daily with a meal. 60 tablet 3  . Cholecalciferol (VITAMIN D) 2000 units CAPS Take 2,000 Units by mouth daily.     . furosemide (LASIX) 20 MG tablet Take 1 tablet (20 mg total) by mouth daily. 30 tablet 3   . gabapentin (NEURONTIN) 300 MG capsule Take 300 mg by mouth daily.     . metFORMIN (GLUCOPHAGE-XR) 500 MG 24 hr tablet Take 500 mg by mouth daily with breakfast.    . Omega-3 Fatty Acids (FISH OIL) 1200 MG CAPS Take 1,200 mg by mouth 2 (two) times daily.     . pseudoephedrine-guaifenesin (MUCINEX D) 60-600 MG 12 hr tablet Take 2 tablets by mouth every 12 (twelve) hours.     . simvastatin (ZOCOR) 40 MG tablet Take 40 mg by mouth daily at 6 PM.     . vitamin B-12 (CYANOCOBALAMIN) 1000 MCG tablet Take 1,000 mcg by mouth daily.    Marland Kitchen guaiFENesin (ROBITUSSIN) 100 MG/5ML liquid Take 400 mg by mouth 4 (four) times daily as needed for cough.     Facility-Administered Medications Ordered in Other Encounters  Medication Dose Route Frequency Provider Last Rate Last Dose  . 0.9 %  sodium chloride infusion  1,000 mL Intravenous Continuous Noemi Chapel, MD 125 mL/hr at 10/14/17 2257 1,000 mL at 10/14/17 2257  . levofloxacin (LEVAQUIN) IVPB 500 mg  500 mg Intravenous Q24H Yopp, Amber C, RPH      . vancomycin (VANCOCIN) 1,250 mg in sodium chloride 0.9 % 250 mL IVPB  1,250 mg Intravenous Q24H Yopp, Amber C, RPH      . vancomycin (VANCOCIN) 2,000 mg in sodium chloride 0.9 % 500 mL IVPB  2,000 mg Intravenous Once Yopp, Amber C, RPH       BP (!) 96/45 Comment: standing  Pulse 69   Wt 253 lb 8 oz (115 kg)   SpO2 93%   BMI 34.38 kg/m  General: NAD Neck: No JVD, no thyromegaly or thyroid nodule.  Lungs: Occasional rhonchi.  CV: Nondisplaced PMI.  Heart regular S1/S2, no S3/S4, no murmur.  1+ ankle edema.  No carotid bruit.  Normal pedal pulses.  Abdomen: Soft, nontender, no hepatosplenomegaly, no distention.  Skin: Intact without lesions or rashes.  Neurologic: Alert and oriented x 3.  Psych: Normal affect. Extremities: No clubbing or cyanosis.  HEENT: Normal.   Assessment/Plan: 1. Atrial fibrillation: DCCV in 12/18.  Recurrent of atrial fibrillation with initiation of amiodarone then TEE-guided DCCV  (missed an Eliquis dose) in 1/19.  Today, he is back in atrial fibrillation in the setting of URI versus PNA and use of Mucinex-D.  - Continue Coreg 3.125 mg bid.  - Increase amiodarone to 200 mg daily.  - He needs to stop Mucinex-D as it contains a stimulant.  He can take plain Mucinex.   - He will need a nursing visit next week for ECG to see if he goes back into NSR after he recovers from the current  respiratory infection.  If he remains in atrial fibrillation, will discuss with patient and family whether we pursue repeat DCCV.  2. CKD: Stage 3.  BMET today.   3. Chronic diastolic CHF: EF 47-42% from 1/19 TEE.  He does not look volume overloaded on exam, NYHA class II symptoms.  He has been lightheaded with standing and is not eating well.  - Hold Lasix x 2 days then decrease to 20 mg daily.  - BMET today.  4. URI versus PNA: Rhonchi on lung exam. I will get CXR PA/lateral to assess for PNA, needs antibiotic if infiltrate is noted.   Followup 3 wks.    Loralie Champagne 10/15/2017

## 2017-10-15 NOTE — Evaluation (Signed)
Physical Therapy Evaluation Patient Details Name: Dylan [name] MRN: [id number] DOB: 1925/10/01 Today's Date: 10/15/2017   History of Present Illness  Pt is an 84 y.o. male s/p recent pacemaker placement (10/05/17) admitted 10/14/17 after fall at home sustaining hematoma around R eye. CT shows no acute intracranial abnormality. CXR concerning for pneumonia. PMH includes CHF, DMII, back sx (2008).    Clinical Impression  Pt presents with an overall decrease in functional mobility secondary to above. PTA, pt mod indep with rollator and lives with wife available for 24/7 support; pt endorses multiple falls at home "when I get in a hurry."  Today, pt able to amb 200' with rollator and supervision for safety; educ on safe technique and fall risk reduction with use of rollator. Pt would benefit from continued acute PT services to maximize functional mobility and independence prior to d/c with HHPT services.     Follow Up Recommendations Home health PT;Supervision for mobility/OOB    Equipment Recommendations  None recommended by PT    Recommendations for Other Services       Precautions / Restrictions Precautions Precautions: Fall;ICD/Pacemaker Precaution Comments: s/p pacemaker 10/05/17 Restrictions Weight Bearing Restrictions: No      Mobility  Bed Mobility Overal bed mobility: Independent                Transfers Overall transfer level: Needs assistance Equipment used: 4-wheeled walker Transfers: Sit to/from Stand Sit to Stand: Supervision         General transfer comment: Cues to lock brakes on rollator prior to standing. Able to stand from bed and rollator with supervision and cues for safety  Ambulation/Gait Ambulation/Gait assistance: Supervision Ambulation Distance (Feet): 200 Feet Assistive device: 4-wheeled walker Gait Pattern/deviations: Step-through pattern;Decreased stride length;Trunk flexed     General Gait Details: Steady, but slightly impulsive amb  with rollator, requiring supervision for safety and intermittent cues to maintain closer proximity to RW. Cues to lock brakes when standing still to complete task. 1x seated rest break on rollator seat secondary to c/o fatigue and back pain  Stairs            Wheelchair Mobility    Modified Rankin (Stroke Patients Only)       Balance Overall balance assessment: Needs assistance Sitting-balance support: No upper extremity supported;Feet unsupported Sitting balance-[name]: Good Sitting balance - Comments: Indep to don socks seated EOB     Standing balance-[name]: Fair Standing balance comment: Can stand to use urinal with no UE support and supervision for safety; cues to lock rollator brakes                             Pertinent Vitals/Pain Pain Assessment: Faces Faces Pain Scale: Hurts a little bit Pain Location: RLE Pain Descriptors / Indicators: Numbness Pain Intervention(s): Monitored during session;Limited activity within patient's tolerance    Home Living Family/patient expects to be discharged to:: Private residence Living Arrangements: Spouse/significant other Available Help at Discharge: Family;Available 24 hours/day Type of Home: House Home Access: Stairs to enter;Ramped entrance   Entrance Stairs-Number of Steps: 2 Home Layout: One level Home Equipment: Walker - 2 wheels;Cane - single point;Shower seat;Bedside commode;Grab bars - tub/shower;Walker - 4 wheels      Prior Function Level of Independence: Independent with assistive device(s)         Comments: Mod indep with rollator; endorses multiple falls at home from "moving too fast"     Hand Dominance  Extremity/Trunk Assessment   Upper Extremity Assessment Upper Extremity Assessment: Overall WFL for tasks assessed    Lower Extremity Assessment Lower Extremity Assessment: Overall WFL for tasks assessed    Cervical / Trunk Assessment Cervical / Trunk Assessment:  Kyphotic  Communication   Communication: HOH  Cognition Arousal/Alertness: Awake/alert Behavior During Therapy: WFL for tasks assessed/performed Overall Cognitive Status: Impaired/Different from baseline Area of Impairment: Attention;Memory;Safety/judgement                   Current Attention Level: Selective Memory: Decreased short-term memory   Safety/Judgement: Decreased awareness of safety     General Comments: Pt slightly impulsive/quick with movement, requiring intermittent cues for safety and to slow down      General Comments      Exercises     Assessment/Plan    PT Assessment Patient needs continued PT services  PT Problem List Decreased activity tolerance;Decreased balance;Decreased mobility;Decreased cognition;Decreased safety awareness;Decreased knowledge of use of DME       PT Treatment Interventions DME instruction;Gait training;Stair training;Functional mobility training;Therapeutic activities;Therapeutic exercise;Balance training;Patient/family education    PT Goals (Current goals can be found in the Care Plan section)  Acute Rehab PT Goals Patient Stated Goal: Return home PT Goal Formulation: With patient Time For Goal Achievement: 10/29/17 Potential to Achieve Goals: Good    Frequency Min 3X/week   Barriers to discharge        Co-evaluation               AM-PAC PT "6 Clicks" Daily Activity  Outcome Measure Difficulty turning over in bed (including adjusting bedclothes, sheets and blankets)?: None Difficulty moving from lying on back to sitting on the side of the bed? : None Difficulty sitting down on and standing up from a chair with arms (e.g., wheelchair, bedside commode, etc,.)?: A Little Help needed moving to and from a bed to chair (including a wheelchair)?: A Little Help needed walking in hospital room?: A Little Help needed climbing 3-5 steps with a railing? : A Little 6 Click Score: 20    End of Session Equipment  Utilized During Treatment: Gait belt;Oxygen Activity Tolerance: Patient tolerated treatment well Patient left: in chair;with call bell/phone within reach;with chair alarm set Nurse Communication: Mobility status PT Visit Diagnosis: Other abnormalities of gait and mobility (R26.89)    Time: 8366-2947 PT Time Calculation (min) (ACUTE ONLY): 41 min   Charges:   PT Evaluation $PT Eval Moderate Complexity: 1 Mod PT Treatments $Gait Training: 8-22 mins $Therapeutic Activity: 8-22 mins   PT G Codes:       Mabeline Caras, PT, DPT Acute Rehab Services  Pager: [place] 10/15/2017, 10:34 AM

## 2017-10-15 NOTE — Progress Notes (Signed)
Patient seen and examined at bedside, patient admitted after midnight, please see earlier detailed admission note by Rise Patience, MD. Briefly, patient presented with right periorbital hematoma s/p fall and HCAP. Continue antibiotics. Repeat hemoglobin this afternoon, and restart Eliquis this evening. Repeat CBC in AM.   Cordelia Poche, MD [facility] 10/15/2017, 1:04 PM Pager: [phone]

## 2017-10-15 NOTE — Care Management Obs Status (Signed)
[name] NOTIFICATION   Patient Details  Name: Dylan [name] MRN: [id number] Date of Birth: 02-Jun-1926   Medicare Observation Status Notification Given:  Yes    Carles Collet, RN 10/15/2017, 9:27 AM

## 2017-10-15 NOTE — Progress Notes (Signed)
Pt. Arrived to the unit in alert and stable condition. No s/s of distress or discomfort noted. Pt. Oriented to room and placed on telemetry . CCMD notified of admission to the unit. Call light placed within reach. Pt. Requested that admission history information be gathered when wife is present.

## 2017-10-15 NOTE — Plan of Care (Signed)
Pt. Found by NT in the floor at bedside. Attempting to get out of bed unassisted. Pt. Stated he was trying to get to the bathroom, got tangled in his blankets, and slid on the floor. Pt. Stated he slid on his bottom. Call light within reach of patient upon assessment. Pt. Encouraged to use it when needing toileting assistance. Pt. In low bed, fall risk bracelet and socks on. Fall risk score updated. No injuries noted. Pt. Denies pain or discomfort at this time. On call NP, K. Schoor, paged to make aware. Pts. Wife notified. RN will continue to monitor pt.

## 2017-10-15 NOTE — H&P (Signed)
History and Physical    Dylan [name] VOJ:[id number] DOB: [birth year] DOA: 10/14/2017  PCP: Haywood Pao, MD  Patient coming from: Home.  Chief Complaint: Fall.  HPI: Dylan [name] is a 84 y.o. male with history of recent pacemaker placement for sinus bradycardia with near syncope and was discharged on October 07, 2017 but followed up with his cardiologist yesterday was brought to the ER after patient had a fall.  Patient had 2 falls yesterday when at home when he tripped.  Another time was when he was in the main he was at the shop he was running to urinate when he lost balance and fell hit his face.  Patient developed a hematoma around his right eye.  Unable to open his eye.  Denies any headache chest pain shortness of breath nausea vomiting or diarrhea.  Has been having nonproductive cough for last 3 days with some wheezing.  Patient also has been taking Mucinex D.  ED Course: In the ER CT head maxillofacial and C-spine were done.  Shows soft tissue swelling of the right periorbital area.  Chest x-ray shows concerning for pneumonia.  Patient was started on empiric antibiotics for healthcare associated pneumonia and admitted for further observation of his right periorbital hematoma.  Review of Systems: As per HPI, rest all negative.   Past Medical History:  Diagnosis Date  . Hyperglycemia   . Hyperlipidemia   . Hypertension   . Lung nodule     Past Surgical History:  Procedure Laterality Date  . APPENDECTOMY  1956  . BACK SURGERY  2008  . CARDIAC CATHETERIZATION  1995   negative results  . CARDIOVERSION N/A 08/05/2017   Procedure: CARDIOVERSION;  Surgeon: Larey Dresser, MD;  Location: [facility] ENDOSCOPY;  Service: Cardiovascular;  Laterality: N/A;  . CARDIOVERSION N/A 09/12/2017   Procedure: CARDIOVERSION;  Surgeon: Larey Dresser, MD;  Location: [facility] ENDOSCOPY;  Service: Cardiovascular;  Laterality: N/A;  . COLON SURGERY  07/09/12  . [place]  . HOT  HEMOSTASIS  01/08/2012   Procedure: HOT HEMOSTASIS (ARGON PLASMA COAGULATION/BICAP);  Surgeon: Arta Silence, MD;  Location: [facility] ENDOSCOPY;  Service: Endoscopy;  Laterality: N/A;  . LAPAROSCOPIC ILEOCECECTOMY  07/09/2012   Procedure: LAPAROSCOPIC ILEOCECECTOMY;  Surgeon: Edward Jolly, MD;  Location: [facility] ORS;  Service: General;  Laterality: N/A;  Laparoscopic Ileocecectomy  . PACEMAKER IMPLANT N/A 10/05/2017   Procedure: PACEMAKER IMPLANT;  Surgeon: Deboraha Sprang, MD;  Location: [facility] CV LAB;  Service: Cardiovascular;  Laterality: N/A;  . REPLACEMENT TOTAL KNEE  2010  . TEE WITHOUT CARDIOVERSION N/A 08/05/2017   Procedure: TRANSESOPHAGEAL ECHOCARDIOGRAM (TEE);  Surgeon: Larey Dresser, MD;  Location: [facility] ENDOSCOPY;  Service: Cardiovascular;  Laterality: N/A;  . TEE WITHOUT CARDIOVERSION N/A 09/12/2017   Procedure: TRANSESOPHAGEAL ECHOCARDIOGRAM (TEE);  Surgeon: Larey Dresser, MD;  Location: [facility] ENDOSCOPY;  Service: Cardiovascular;  Laterality: N/A;     reports that he quit smoking about 25 years ago. His smoking use included cigarettes. He smoked 1.00 pack per day. he has never used smokeless tobacco. He reports that he drinks about 1.2 oz of alcohol per week. He reports that he does not use drugs.  Allergies  Allergen Reactions  . Keflex [Cephalexin] Hives and Other (See Comments)    Occurred in the 65's    Family History  Problem Relation Age of Onset  . Cancer - Other Mother   . CVA Father   . Heart attack Brother   .  Heart attack Maternal Grandmother     Prior to Admission medications   Medication Sig Start Date End Date Taking? Authorizing Provider  amiodarone (PACERONE) 200 MG tablet Take 1 tablet (200 mg total) by mouth daily. 10/14/17  Yes Larey Dresser, MD  apixaban (ELIQUIS) 5 MG TABS tablet Take 1 tablet (5 mg total) by mouth 2 (two) times daily. 08/26/17  Yes Larey Dresser, MD  carvedilol (COREG) 3.125 MG tablet Take 1 tablet (3.125 mg total) by mouth 2  (two) times daily with a meal. 09/17/17  Yes Larey Dresser, MD  Cholecalciferol (VITAMIN D) 2000 units CAPS Take 2,000 Units by mouth daily.    Yes [provider]  furosemide (LASIX) 20 MG tablet Take 1 tablet (20 mg total) by mouth daily. 10/14/17  Yes Larey Dresser, MD  gabapentin (NEURONTIN) 300 MG capsule Take 300 mg by mouth daily.    Yes [provider]  guaiFENesin (ROBITUSSIN) 100 MG/5ML liquid Take 400 mg by mouth 4 (four) times daily as needed for cough.   Yes [provider]  metFORMIN (GLUCOPHAGE-XR) 500 MG 24 hr tablet Take 500 mg by mouth daily with breakfast.   Yes [provider]  Omega-3 Fatty Acids (FISH OIL) 1200 MG CAPS Take 1,200 mg by mouth 2 (two) times daily.    Yes [provider]  pseudoephedrine-guaifenesin (MUCINEX D) 60-600 MG 12 hr tablet Take 2 tablets by mouth every 12 (twelve) hours.    Yes [provider]  simvastatin (ZOCOR) 40 MG tablet Take 40 mg by mouth daily at 6 PM.    Yes [provider]  vitamin B-12 (CYANOCOBALAMIN) 1000 MCG tablet Take 1,000 mcg by mouth daily.   Yes [provider]    Physical Exam: Vitals:   10/15/17 0000 10/15/17 0015 10/15/17 0030 10/15/17 0157  BP: (!) 129/97 139/74 140/75 125/77  Pulse: 73 100 76 65  Resp: 20 18 16    Temp:    98.2 F (36.8 C)  TempSrc:    Oral  SpO2: 92% 96% 93% 94%  Weight:    114 kg (251 lb 4.8 oz)  Height:    6' (1.829 m)      Constitutional: Moderately built and nourished. Vitals:   10/15/17 0000 10/15/17 0015 10/15/17 0030 10/15/17 0157  BP: (!) 129/97 139/74 140/75 125/77  Pulse: 73 100 76 65  Resp: 20 18 16    Temp:    98.2 F (36.8 C)  TempSrc:    Oral  SpO2: 92% 96% 93% 94%  Weight:    114 kg (251 lb 4.8 oz)  Height:    6' (1.829 m)   Eyes: Unable to open his right eyelid due to right periorbital hematoma.  Left eye appears normal. ENMT: No discharge from the ears eyes nose or mouth. Neck: No mass felt.  No  neck rigidity. Respiratory: No rhonchi or crepitations. Cardiovascular: S1-S2 heard no murmurs appreciated. Abdomen: Soft nontender bowel sounds present. Musculoskeletal: No edema.  No joint effusion. Skin: No rash.  Skin appears warm.  Right periorbital hematoma. Neurologic: Alert awake oriented to time place and person.  Moves all extremities. Psychiatric: Appears normal.  Normal affect.   Labs on Admission: I have personally reviewed following labs and imaging studies  CBC: Recent Labs  Lab 10/14/17 1221 10/14/17 2152  WBC 5.8 6.3  NEUTROABS  --  4.6  HGB 13.9 13.1  HCT 41.0 39.2  MCV 93.4 91.4  PLT 124* 025*   Basic Metabolic  Panel: Recent Labs  Lab 10/14/17 1221 10/14/17 2152  NA 131* 131*  K 4.0 3.7  CL 96* 96*  CO2 24 21*  GLUCOSE 96 124*  BUN 13 15  CREATININE 1.12 1.19  CALCIUM 8.9 8.6*   GFR: Estimated Creatinine Clearance: 54.9 mL/min (by C-G formula based on SCr of 1.19 mg/dL). Liver Function Tests: Recent Labs  Lab 10/14/17 1221 10/14/17 2152  AST 35 24  ALT 17 17  ALKPHOS 83 76  BILITOT 2.2* 1.9*  PROT 7.1 6.7  ALBUMIN 3.7 3.6   No results for input(s): LIPASE, AMYLASE in the last 168 hours. No results for input(s): AMMONIA in the last 168 hours. Coagulation Profile: No results for input(s): INR, PROTIME in the last 168 hours. Cardiac Enzymes: No results for input(s): CKTOTAL, CKMB, CKMBINDEX, TROPONINI in the last 168 hours. BNP (last 3 results) Recent Labs    08/01/17 1517  PROBNP 1,490*   HbA1C: No results for input(s): HGBA1C in the last 72 hours. CBG: No results for input(s): GLUCAP in the last 168 hours. Lipid Profile: No results for input(s): CHOL, HDL, LDLCALC, TRIG, CHOLHDL, LDLDIRECT in the last 72 hours. Thyroid Function Tests: No results for input(s): TSH, T4TOTAL, FREET4, T3FREE, THYROIDAB in the last 72 hours. Anemia Panel: No results for input(s): VITAMINB12, FOLATE, FERRITIN, TIBC, IRON, RETICCTPCT in the last 72  hours. Urine analysis:    Component Value Date/Time   COLORURINE YELLOW 01/16/2017 2106   APPEARANCEUR CLEAR 01/16/2017 2106   LABSPEC 1.013 01/16/2017 2106   PHURINE 5.0 01/16/2017 2106   GLUCOSEU NEGATIVE 01/16/2017 2106   HGBUR NEGATIVE 01/16/2017 2106   BILIRUBINUR NEGATIVE 01/16/2017 2106   KETONESUR NEGATIVE 01/16/2017 2106   PROTEINUR NEGATIVE 01/16/2017 2106   UROBILINOGEN 1.0 11/22/2009 1423   NITRITE NEGATIVE 01/16/2017 2106   LEUKOCYTESUR MODERATE (A) 01/16/2017 2106   Sepsis Labs: @LABRCNTIP (procalcitonin:4,lacticidven:4) )No results found for this or any previous visit (from the past 240 hour(s)).   Radiological Exams on Admission: Dg Chest 2 View  Result Date: 10/14/2017 CLINICAL DATA:  Cough and shortness of breath. EXAM: CHEST  2 VIEW COMPARISON:  Chest x-ray dated October 06, 2017. FINDINGS: Unchanged left chest wall AICD. Stable mild cardiomegaly. Normal pulmonary vascularity. New subtle patchy density in the left lower lobe. No pleural effusion or pneumothorax. No acute osseous abnormality. IMPRESSION: New subtle patchy opacity in the left lower lobe, concerning for pneumonia. Electronically Signed   By: Titus Dubin M.D.   On: 10/14/2017 18:35   Dg Pelvis 1-2 Views  Result Date: 10/15/2017 CLINICAL DATA:  84 year old male with fall and left-sided hip pain. EXAM: PELVIS - 1-2 VIEW COMPARISON:  Sacral bone radiograph dated 01/21/2017 FINDINGS: There is no acute fracture or dislocation. The bones are osteopenic. Mild arthritic changes of the hips. Degenerative changes of the lower lumbar spine. The soft tissues appear unremarkable. IMPRESSION: No acute fracture or dislocation. Electronically Signed   By: Anner Crete M.D.   On: 10/15/2017 00:54   Ct Head Wo Contrast  Result Date: 10/14/2017 CLINICAL DATA:  Fall.  Swelling over right orbit EXAM: CT HEAD WITHOUT CONTRAST CT MAXILLOFACIAL WITHOUT CONTRAST CT CERVICAL SPINE WITHOUT CONTRAST TECHNIQUE:  Multidetector CT imaging of the head, cervical spine, and maxillofacial structures were performed using the standard protocol without intravenous contrast. Multiplanar CT image reconstructions of the cervical spine and maxillofacial structures were also generated. COMPARISON:  10/04/2016 FINDINGS: CT HEAD FINDINGS Brain: There is atrophy and chronic small vessel disease changes. No acute intracranial abnormality. Specifically,  no hemorrhage, hydrocephalus, mass lesion, acute infarction, or significant intracranial injury. Vascular: No hyperdense vessel or unexpected calcification. Skull: No acute calvarial abnormality. Other: Marked soft tissue swelling over the right orbit and forehead. No underlying bony abnormality. CT MAXILLOFACIAL FINDINGS Osseous: No fracture or mandibular dislocation. No destructive process. Orbits: Negative. No traumatic or inflammatory finding. Sinuses: Mucosal thickening throughout the paranasal sinuses. No air-fluid levels. Mastoid air cells are clear. Prior mastoidectomy on the right. Soft tissues: Marked soft tissue swelling over the right orbit and forehead. Globes are intact. CT CERVICAL SPINE FINDINGS Alignment: Loss of cervical lordosis.  No subluxation. Skull base and vertebrae: No fracture Soft tissues and spinal canal: Prevertebral soft tissues are normal. No epidural or paraspinal hematoma. Disc levels: Advanced degenerative disc and diffuse degenerative facet disease throughout the cervical spine. Upper chest: No acute findings. Other: No acute findings IMPRESSION: No acute intracranial abnormality. Atrophy, chronic microvascular disease. No evidence of facial, orbital, or cervical spine fracture. Marked soft tissue swelling over the right orbit and forehead. Chronic sinusitis. Advanced degenerative disc and facet disease in the cervical spine. Loss of cervical lordosis, likely degenerative in nature. No fracture or subluxation. Electronically Signed   By: Rolm Baptise M.D.    On: 10/14/2017 20:22   Ct Cervical Spine Wo Contrast  Result Date: 10/14/2017 CLINICAL DATA:  Fall.  Swelling over right orbit EXAM: CT HEAD WITHOUT CONTRAST CT MAXILLOFACIAL WITHOUT CONTRAST CT CERVICAL SPINE WITHOUT CONTRAST TECHNIQUE: Multidetector CT imaging of the head, cervical spine, and maxillofacial structures were performed using the standard protocol without intravenous contrast. Multiplanar CT image reconstructions of the cervical spine and maxillofacial structures were also generated. COMPARISON:  10/04/2016 FINDINGS: CT HEAD FINDINGS Brain: There is atrophy and chronic small vessel disease changes. No acute intracranial abnormality. Specifically, no hemorrhage, hydrocephalus, mass lesion, acute infarction, or significant intracranial injury. Vascular: No hyperdense vessel or unexpected calcification. Skull: No acute calvarial abnormality. Other: Marked soft tissue swelling over the right orbit and forehead. No underlying bony abnormality. CT MAXILLOFACIAL FINDINGS Osseous: No fracture or mandibular dislocation. No destructive process. Orbits: Negative. No traumatic or inflammatory finding. Sinuses: Mucosal thickening throughout the paranasal sinuses. No air-fluid levels. Mastoid air cells are clear. Prior mastoidectomy on the right. Soft tissues: Marked soft tissue swelling over the right orbit and forehead. Globes are intact. CT CERVICAL SPINE FINDINGS Alignment: Loss of cervical lordosis.  No subluxation. Skull base and vertebrae: No fracture Soft tissues and spinal canal: Prevertebral soft tissues are normal. No epidural or paraspinal hematoma. Disc levels: Advanced degenerative disc and diffuse degenerative facet disease throughout the cervical spine. Upper chest: No acute findings. Other: No acute findings IMPRESSION: No acute intracranial abnormality. Atrophy, chronic microvascular disease. No evidence of facial, orbital, or cervical spine fracture. Marked soft tissue swelling over the right  orbit and forehead. Chronic sinusitis. Advanced degenerative disc and facet disease in the cervical spine. Loss of cervical lordosis, likely degenerative in nature. No fracture or subluxation. Electronically Signed   By: Rolm Baptise M.D.   On: 10/14/2017 20:22   Ct Maxillofacial Wo Contrast  Result Date: 10/14/2017 CLINICAL DATA:  Fall.  Swelling over right orbit EXAM: CT HEAD WITHOUT CONTRAST CT MAXILLOFACIAL WITHOUT CONTRAST CT CERVICAL SPINE WITHOUT CONTRAST TECHNIQUE: Multidetector CT imaging of the head, cervical spine, and maxillofacial structures were performed using the standard protocol without intravenous contrast. Multiplanar CT image reconstructions of the cervical spine and maxillofacial structures were also generated. COMPARISON:  10/04/2016 FINDINGS: CT HEAD FINDINGS Brain: There is atrophy  and chronic small vessel disease changes. No acute intracranial abnormality. Specifically, no hemorrhage, hydrocephalus, mass lesion, acute infarction, or significant intracranial injury. Vascular: No hyperdense vessel or unexpected calcification. Skull: No acute calvarial abnormality. Other: Marked soft tissue swelling over the right orbit and forehead. No underlying bony abnormality. CT MAXILLOFACIAL FINDINGS Osseous: No fracture or mandibular dislocation. No destructive process. Orbits: Negative. No traumatic or inflammatory finding. Sinuses: Mucosal thickening throughout the paranasal sinuses. No air-fluid levels. Mastoid air cells are clear. Prior mastoidectomy on the right. Soft tissues: Marked soft tissue swelling over the right orbit and forehead. Globes are intact. CT CERVICAL SPINE FINDINGS Alignment: Loss of cervical lordosis.  No subluxation. Skull base and vertebrae: No fracture Soft tissues and spinal canal: Prevertebral soft tissues are normal. No epidural or paraspinal hematoma. Disc levels: Advanced degenerative disc and diffuse degenerative facet disease throughout the cervical spine. Upper  chest: No acute findings. Other: No acute findings IMPRESSION: No acute intracranial abnormality. Atrophy, chronic microvascular disease. No evidence of facial, orbital, or cervical spine fracture. Marked soft tissue swelling over the right orbit and forehead. Chronic sinusitis. Advanced degenerative disc and facet disease in the cervical spine. Loss of cervical lordosis, likely degenerative in nature. No fracture or subluxation. Electronically Signed   By: Rolm Baptise M.D.   On: 10/14/2017 20:22    EKG: Independently reviewed.  A. fib rate controlled.  Nonspecific inferior changes.  Assessment/Plan Principal Problem:   HCAP (healthcare-associated pneumonia) Active Problems:   DM II (diabetes mellitus, type II), controlled ([place])   Atrial fibrillation (HCC)   Diastolic dysfunction with acute on chronic heart failure (HCC)   Periorbital hematoma of left eye   Fall    1. Healthcare associated pneumonia -patient is on empiric antibiotics.  Patient was just recently discharged after placement of pacemaker placement.  Check influenza PCR urine for Legionella and strep antigen.  Follow cultures. 2. Right periorbital hematoma status post fall -discussed with patient about holding apixaban since patient has right periorbital hematoma.  Patient agrees to holding for now.  Will discuss with trauma services.  CAT scan does not show any obvious fractures. 3. Atrial fibrillation rate controlled presently on amiodarone Coreg and apixaban on hold.  Due to #2.  Cardiologist has just increased his amiodarone yesterday.  Patient will need to be advised not to take Mucinex D but plain Mucinex is okay. 4. Bradycardia status post pacemaker placement recently. 5. Diastolic CHF appears compensated. 6. Diabetes mellitus type 2 we will hold metformin for now and place patient on sliding scale coverage.   DVT prophylaxis: SCDs. Code Status: Full code. Family Communication: Discussed with patient. Disposition Plan:  To be determined. Consults called: Physical therapy. Admission status: Observation.   Rise Patience MD [facility] Pager [phone].  If 7PM-7AM, please contact night-coverage [email or web address] Password [email or web address]  10/15/2017, 2:16 AM

## 2017-10-16 DIAGNOSIS — Z8701 Personal history of pneumonia (recurrent): Secondary | ICD-10-CM | POA: Diagnosis not present

## 2017-10-16 DIAGNOSIS — R0902 Hypoxemia: Secondary | ICD-10-CM | POA: Diagnosis present

## 2017-10-16 DIAGNOSIS — Z950 Presence of cardiac pacemaker: Secondary | ICD-10-CM | POA: Diagnosis not present

## 2017-10-16 DIAGNOSIS — Z96659 Presence of unspecified artificial knee joint: Secondary | ICD-10-CM | POA: Diagnosis present

## 2017-10-16 DIAGNOSIS — I4891 Unspecified atrial fibrillation: Secondary | ICD-10-CM | POA: Diagnosis not present

## 2017-10-16 DIAGNOSIS — I130 Hypertensive heart and chronic kidney disease with heart failure and stage 1 through stage 4 chronic kidney disease, or unspecified chronic kidney disease: Secondary | ICD-10-CM | POA: Diagnosis not present

## 2017-10-16 DIAGNOSIS — W1830XA Fall on same level, unspecified, initial encounter: Secondary | ICD-10-CM | POA: Diagnosis present

## 2017-10-16 DIAGNOSIS — Z87891 Personal history of nicotine dependence: Secondary | ICD-10-CM | POA: Diagnosis not present

## 2017-10-16 DIAGNOSIS — E1122 Type 2 diabetes mellitus with diabetic chronic kidney disease: Secondary | ICD-10-CM | POA: Diagnosis not present

## 2017-10-16 DIAGNOSIS — Z9049 Acquired absence of other specified parts of digestive tract: Secondary | ICD-10-CM | POA: Diagnosis not present

## 2017-10-16 DIAGNOSIS — E871 Hypo-osmolality and hyponatremia: Secondary | ICD-10-CM | POA: Diagnosis not present

## 2017-10-16 DIAGNOSIS — Z79899 Other long term (current) drug therapy: Secondary | ICD-10-CM | POA: Diagnosis not present

## 2017-10-16 DIAGNOSIS — J189 Pneumonia, unspecified organism: Secondary | ICD-10-CM | POA: Diagnosis not present

## 2017-10-16 DIAGNOSIS — E785 Hyperlipidemia, unspecified: Secondary | ICD-10-CM | POA: Diagnosis present

## 2017-10-16 DIAGNOSIS — Z881 Allergy status to other antibiotic agents status: Secondary | ICD-10-CM | POA: Diagnosis not present

## 2017-10-16 DIAGNOSIS — I482 Chronic atrial fibrillation: Secondary | ICD-10-CM | POA: Diagnosis not present

## 2017-10-16 DIAGNOSIS — N183 Chronic kidney disease, stage 3 (moderate): Secondary | ICD-10-CM | POA: Diagnosis not present

## 2017-10-16 DIAGNOSIS — I5032 Chronic diastolic (congestive) heart failure: Secondary | ICD-10-CM | POA: Diagnosis not present

## 2017-10-16 DIAGNOSIS — Z8249 Family history of ischemic heart disease and other diseases of the circulatory system: Secondary | ICD-10-CM | POA: Diagnosis not present

## 2017-10-16 DIAGNOSIS — E860 Dehydration: Secondary | ICD-10-CM | POA: Diagnosis not present

## 2017-10-16 DIAGNOSIS — J181 Lobar pneumonia, unspecified organism: Secondary | ICD-10-CM | POA: Diagnosis not present

## 2017-10-16 DIAGNOSIS — Z7901 Long term (current) use of anticoagulants: Secondary | ICD-10-CM | POA: Diagnosis not present

## 2017-10-16 DIAGNOSIS — Y95 Nosocomial condition: Secondary | ICD-10-CM | POA: Diagnosis not present

## 2017-10-16 DIAGNOSIS — S0011XA Contusion of right eyelid and periocular area, initial encounter: Secondary | ICD-10-CM | POA: Diagnosis not present

## 2017-10-16 LAB — GLUCOSE, CAPILLARY
GLUCOSE-CAPILLARY: 124 mg/dL — AB (ref 65–99)
GLUCOSE-CAPILLARY: 102 mg/dL — AB (ref 65–99)
Glucose-Capillary: 111 mg/dL — ABNORMAL HIGH (ref 65–99)
Glucose-Capillary: 103 mg/dL — ABNORMAL HIGH (ref 65–99)

## 2017-10-16 LAB — CBC
HCT: 35.4 % — ABNORMAL LOW (ref 39.0–52.0)
Hemoglobin: 11.9 g/dL — ABNORMAL LOW (ref 13.0–17.0)
MCH: 31.3 pg (ref 26.0–34.0)
MCHC: 33.6 g/dL (ref 30.0–36.0)
MCV: 93.2 fL (ref 78.0–100.0)
PLATELETS: 99 10*3/uL — AB (ref 150–400)
RBC: 3.8 MIL/uL — ABNORMAL LOW (ref 4.22–5.81)
RDW: 13.4 % (ref 11.5–15.5)
WBC: 3.8 10*3/uL — AB (ref 4.0–10.5)

## 2017-10-16 LAB — MRSA PCR SCREENING: MRSA BY PCR: NEGATIVE

## 2017-10-16 MED ORDER — ATORVASTATIN CALCIUM 20 MG PO TABS
20.0000 mg | ORAL_TABLET | Freq: Every day | ORAL | Status: DC
  Administered 2017-10-16: 20 mg via ORAL
  Filled 2017-10-16: qty 1

## 2017-10-16 MED ORDER — LEVOFLOXACIN IN D5W 500 MG/100ML IV SOLN
500.0000 mg | INTRAVENOUS | Status: DC
  Administered 2017-10-16: 500 mg via INTRAVENOUS
  Filled 2017-10-16: qty 100

## 2017-10-16 MED ORDER — SODIUM CHLORIDE 0.9 % IV SOLN
INTRAVENOUS | Status: DC
  Administered 2017-10-16: 21:00:00 via INTRAVENOUS

## 2017-10-16 NOTE — Progress Notes (Signed)
Ambulated with pt in the hallway. Pt used front wheel walker and 2L of oxygen. Pt tolerated well.

## 2017-10-16 NOTE — Care Management Note (Signed)
Case Management Note  Patient Details  Name: Dylan [name] MRN: [id number] Date of Birth: 1925-12-31  Subjective/Objective:  Pneumonia                 Action/Plan: Patient lives at home with spouse;  PCP: Tisovec, Fransico Him, MD; has private insurance with [employer] Person with prescription drug coverage; pharmacy of choice is [facility]; DME - cane and walker at home; [facility] choice offered, pt chose [place]; Dan with [facility] called for arrangements.  Expected Discharge Date:     Possibly 10/16/2017             Expected Discharge Plan:  [place]  Discharge planning Services  CM Consult  Choice offered to:  Patient, Spouse  HH Arranged:  PT [place] Agency:  [place]  Status of Service:  In process, will continue to follow  Sherrilyn Rist [phone] 10/16/2017, 10:51 AM

## 2017-10-16 NOTE — Progress Notes (Signed)
Physical Therapy Treatment Patient Details Name: Dylan [name] MRN: [id number] DOB: 1926/07/01 Today's Date: 10/16/2017    History of Present Illness Pt is an 84 y.o. male s/p recent pacemaker placement (10/05/17) admitted 10/14/17 after fall at home sustaining hematoma around R eye. CT shows no acute intracranial abnormality. CXR concerning for pneumonia. PMH includes CHF, DMII, back sx (2008).    PT Comments    Pt remains impulsive and required cues throughout to improve safety awareness.  Pt uses humor to disguise safety deficits.  Plan to return home with spouse remains appropriate, patient will still require HHPT to improve safety and function during his recovery at home.    Follow Up Recommendations  Home health PT;Supervision for mobility/OOB     Equipment Recommendations  None recommended by PT    Recommendations for Other Services       Precautions / Restrictions Precautions Precautions: Fall;ICD/Pacemaker Precaution Comments: s/p pacemaker 10/05/17 Restrictions Weight Bearing Restrictions: No    Mobility  Bed Mobility Overal bed mobility: Needs Assistance Bed Mobility: Supine to Sit     Supine to sit: Min assist     General bed mobility comments: Pt able to advance B LEs but require min assistance to elevate trunk into sitting.    Transfers Overall transfer level: Needs assistance Equipment used: 4-wheeled walker Transfers: Sit to/from Stand Sit to Stand: Supervision         General transfer comment: Cues to lock brakes on rollator prior to standing. Able to stand from bed and rollator with supervision and cues for safety. Cues for hand placement.  Ambulation/Gait Ambulation/Gait assistance: Min guard Ambulation Distance (Feet): 425 Feet Assistive device: 4-wheeled walker Gait Pattern/deviations: Step-through pattern;Decreased stride length;Trunk flexed   Gait velocity interpretation: Below normal speed for age/gender General Gait Details: Steady,  but slightly impulsive amb with rollator, requiring supervision for safety and intermittent cues to maintain closer proximity to RW. Cues for upper trunk control to improve posture.     Stairs Stairs: Yes  min guard Stair Management: One rail Right;Alternating pattern;Backwards;Forwards Number of Stairs: 6 General stair comments: Cues for sequencing and hand placement on railing, patient performed forward to ascend and backward to descend.    Wheelchair Mobility    Modified Rankin (Stroke Patients Only)       Balance Overall balance assessment: Needs assistance   Sitting balance-[name]: Good Sitting balance - Comments: Indep to don socks seated EOB     Standing balance-[name]: Fair                              Cognition Arousal/Alertness: Awake/alert Behavior During Therapy: WFL for tasks assessed/performed Overall Cognitive Status: Impaired/Different from baseline Area of Impairment: Attention;Memory;Safety/judgement                     Memory: Decreased short-term memory   Safety/Judgement: Decreased awareness of safety     General Comments: Pt slightly impulsive/quick with movement, requiring intermittent cues for safety and to slow down      Exercises      General Comments        Pertinent Vitals/Pain Pain Assessment: No/denies pain    Home Living                      Prior Function            PT Goals (current goals can now be found in the  care plan section) Acute Rehab PT Goals Patient Stated Goal: Return home Potential to Achieve Goals: Good Progress towards PT goals: Progressing toward goals    Frequency    Min 3X/week      PT Plan Current plan remains appropriate    Co-evaluation              AM-PAC PT "6 Clicks" Daily Activity  Outcome Measure  Difficulty turning over in bed (including adjusting bedclothes, sheets and blankets)?: None Difficulty moving from lying on back to sitting  on the side of the bed? : Unable Difficulty sitting down on and standing up from a chair with arms (e.g., wheelchair, bedside commode, etc,.)?: Unable Help needed moving to and from a bed to chair (including a wheelchair)?: A Little Help needed walking in hospital room?: A Little Help needed climbing 3-5 steps with a railing? : A Little 6 Click Score: 15    End of Session Equipment Utilized During Treatment: Gait belt;Oxygen Activity Tolerance: Patient tolerated treatment well Patient left: in chair;with call bell/phone within reach;with chair alarm set Nurse Communication: Mobility status PT Visit Diagnosis: Other abnormalities of gait and mobility (R26.89)     Time: 8099-8338 PT Time Calculation (min) (ACUTE ONLY): 28 min  Charges:  $Gait Training: 8-22 mins $Therapeutic Activity: 8-22 mins                    G CodesGovernor [name], PTA pager [phone]    Dylan [name] 10/16/2017, 2:00 PM

## 2017-10-16 NOTE — [place] Notes (Signed)
Nutrition Brief Note  Patient identified on the Malnutrition Screening Tool (MST) Report  Wt Readings from Last 15 Encounters:  10/16/17 247 lb 3.2 oz (112.1 kg)  10/14/17 253 lb 8 oz (115 kg)  10/07/17 249 lb 4.7 oz (113.1 kg)  09/17/17 256 lb (116.1 kg)  09/12/17 250 lb (113.4 kg)  08/21/17 255 lb 12.8 oz (116 kg)  08/06/17 248 lb 1.6 oz (112.5 kg)  08/01/17 263 lb 1.9 oz (119.4 kg)  07/22/17 265 lb 12.8 oz (120.6 kg)  07/15/17 262 lb 4.8 oz (119 kg)  01/16/17 240 lb (108.9 kg)  09/06/14 270 lb (122.5 kg)  08/24/14 276 lb (125.2 kg)  07/25/12 253 lb 3.2 oz (114.9 kg)  07/09/12 259 lb (117.5 kg)   Dylan [name] is a 84 y.o. male with history of recent pacemaker placement for sinus bradycardia with near syncope and was discharged on October 07, 2017 but followed up with his cardiologist yesterday was brought to the ER after patient had a fall  Spoke with pt and family at bedside. They report a decreased appetite 3 days PTA, related to a sore throat and generally feeling unwell. He usually consumes 3 meals per day (Breakfast: cereal and coffee, Lunch: casserole and roasted vegetables, Dinner: sandwich). Pt ate all of his breakfast this AM. He denies any weight loss; reports wt ranges between 4#. He is very active; wife endorses pt goes to the gym daily.   Nutrition-Focused physical exam completed. Findings are no fat depletion, no muscle depletion, and no edema.   Pt with excellent glycemic control PTA (last Hgb A1c: 6.1 on 07/14/17). PTA DM medications 500 mg metformin daily).  Labs reviewed: CBGS: 105-138 (inpatient orders for glycemic control are 0-9 units TID with meals).   Body mass index is 33.53 kg/m. Patient meets criteria for obesity, class I based on current BMI.   Current diet order is Heart Healthy/ Carb Modified, patient is consuming approximately 75-100% of meals at this time. Labs and medications reviewed.   No nutrition interventions warranted at this time. If  nutrition issues arise, please consult RD.   Dylan [name], RD, LDN, CDE Pager: [phone] After hours Pager: [phone]

## 2017-10-16 NOTE — [place] Notes (Signed)
PROGRESS NOTE    Dylan [name]  HUD:[id number] DOB: [birth year] DOA: 10/14/2017 PCP: Haywood Pao, MD      Brief Narrative:  Mr. Dylan [name] is a 84 yo M with NIDDM, Afib on amiodarone and Eliquis, SSS with pacer, chronic diastolic CHF, HTN, and CKD 3 baseline Cr 1.1 who presents with several days cough, then fall and orthostasis.   Assessment & Plan:  Principal Problem:   HCAP (healthcare-associated pneumonia) Active Problems:   DM II (diabetes mellitus, type II), controlled ([place])   Atrial fibrillation (HCC)   Diastolic dysfunction with acute on chronic heart failure (HCC)   Periorbital hematoma of left eye   Fall   Community acquired pneumonia left lower lobe Opacity in LLL, rales on exam.  PSI is >90, risk class IV.  Still hypoxic, still orthostatic.  Relatively low risk for DR organisms other than recent hospitalization. -Narrow Aztreonam to Levaquin -MRSA nasal swab and stop Vanc if negative   Orthostasis Right periorbital hematoma Fell on day of admission without LOC. Very orthostatic here, still today. -Hold furosemide and Coreg -Give IV fluids -Hold Eliquis one more day, restart on D/c  Atrial fibrillation, chronic Sick sinus syndrome with pacer CHADS2Vasc 5, on Eliquis and amiodarone. -Hold Eliquis -Continue amiodarone  Chronic diastolic CHF Appears euvolemic to dehydrated on exam. -Hold furosemide and Coreg for now -Change to atorvastatin from simvastatin due to amiodarone interaction -Strict I/Os, daily weights  Type 2 diabetes Glucoses good. -Hold metformin -Continue SSI corrections  Hyponatremia Mild.  In setting of chronic CHF, furosemide as well as pneumonia.  Other medications -Continue gabapentin       DVT prophylaxis: Will resume ELiquis Code Status: FULL Family Communication: WIfe at bedside Disposition Plan: Pneumonia with orthostasis persistent, dehydration, hyponatremia, PSI >90, risk class IV, not improving and with  persistent hypoxia.  WIll need continued inpatient care.  Likely home in 2-3 days.   Consultants:   None  Procedures:   None  Antimicrobials:   Vanc/Aztreonam --> Levaquin    Subjective: Feels better, fell this morning.  Is tired.  Is idzzy with standing.  No chest pain, no new fever.  Still with cough.  Objective: Vitals:   10/16/17 0820 10/16/17 1007 10/16/17 1017 10/16/17 1147  BP: 121/68   129/63  Pulse: 70   75  Resp:    18  Temp:    97.6 F (36.4 C)  TempSrc:    Oral  SpO2: 95% (!) 87% 95% 99%  Weight:      Height:        Intake/Output Summary (Last 24 hours) at 10/16/2017 1253 Last data filed at 10/16/2017 0533 Gross per 24 hour  Intake 340 ml  Output 1750 ml  Net -1410 ml   Filed Weights   10/15/17 0157 10/16/17 0544  Weight: 114 kg (251 lb 4.8 oz) 112.1 kg (247 lb 3.2 oz)    Examination: General appearance: Elderly adult male, alert and in no acute distress.   HEENT: The right eye has a large hematoma. No nasal deformity, discharge, epistaxis.  Lips dry.   Skin: Warm and dry.  No jaundice.  No suspicious rashes or lesions. Cardiac: Irregularly irregular, nl C5-Y8, systolic mrumur.  Capillary refill is brisk.  JVP not visible.  No LE edema.  Radial pulses 2+ and symmetric. Respiratory: Tachhypniec.  Rales at bases, worse on left.   Abdomen: Abdomen soft.  No TTP. No ascites, distension, hepatosplenomegaly.   MSK: No deformities or effusions. Neuro: Awake and alert.  EOMI, moves all extremities. Speech fluent.    Psych: Sensorium intact and responding to questions, attention normal. Affect normal.  Judgment and insight appear normal.    Data Reviewed: I have personally reviewed following labs and imaging studies:  CBC: Recent Labs  Lab 10/14/17 1221 10/14/17 2152 10/15/17 0352 10/15/17 1330 10/16/17 0407  WBC 5.8 6.3 6.0  --  3.8*  NEUTROABS  --  4.6  --   --   --   HGB 13.9 13.1 12.4* 12.0* 11.9*  HCT 41.0 39.2 37.4* 35.3* 35.4*  MCV 93.4  91.4 92.6  --  93.2  PLT 124* 114* 111*  --  99*   Basic Metabolic Panel: Recent Labs  Lab 10/14/17 1221 10/14/17 2152 10/15/17 0352  NA 131* 131* 132*  K 4.0 3.7 3.6  CL 96* 96* 96*  CO2 24 21* 22  GLUCOSE 96 124* 93  BUN 13 15 15   CREATININE 1.12 1.19 1.09  CALCIUM 8.9 8.6* 8.2*   GFR: Estimated Creatinine Clearance: 59.4 mL/min (by C-G formula based on SCr of 1.09 mg/dL). Liver Function Tests: Recent Labs  Lab 10/14/17 1221 10/14/17 2152  AST 35 24  ALT 17 17  ALKPHOS 83 76  BILITOT 2.2* 1.9*  PROT 7.1 6.7  ALBUMIN 3.7 3.6   No results for input(s): LIPASE, AMYLASE in the last 168 hours. No results for input(s): AMMONIA in the last 168 hours. Coagulation Profile: No results for input(s): INR, PROTIME in the last 168 hours. Cardiac Enzymes: Recent Labs  Lab 10/15/17 0424  TROPONINI <0.03   BNP (last 3 results) Recent Labs    08/01/17 1517  PROBNP 1,490*   HbA1C: No results for input(s): HGBA1C in the last 72 hours. CBG: Recent Labs  Lab 10/15/17 1125 10/15/17 1639 10/15/17 2039 10/16/17 0732 10/16/17 1124  GLUCAP 117* 105* 138* 111* 124*   Lipid Profile: No results for input(s): CHOL, HDL, LDLCALC, TRIG, CHOLHDL, LDLDIRECT in the last 72 hours. Thyroid Function Tests: No results for input(s): TSH, T4TOTAL, FREET4, T3FREE, THYROIDAB in the last 72 hours. Anemia Panel: No results for input(s): VITAMINB12, FOLATE, FERRITIN, TIBC, IRON, RETICCTPCT in the last 72 hours. Urine analysis:    Component Value Date/Time   COLORURINE YELLOW 01/16/2017 2106   APPEARANCEUR CLEAR 01/16/2017 2106   LABSPEC 1.013 01/16/2017 2106   PHURINE 5.0 01/16/2017 2106   GLUCOSEU NEGATIVE 01/16/2017 2106   HGBUR NEGATIVE 01/16/2017 2106   BILIRUBINUR NEGATIVE 01/16/2017 2106   KETONESUR NEGATIVE 01/16/2017 2106   PROTEINUR NEGATIVE 01/16/2017 2106   UROBILINOGEN 1.0 11/22/2009 1423   NITRITE NEGATIVE 01/16/2017 2106   LEUKOCYTESUR MODERATE (A) 01/16/2017 2106    Sepsis Labs: @LABRCNTIP (procalcitonin:4,lacticacidven:4)  ) Recent Results (from the past 240 hour(s))  Blood Culture (routine x 2)     Status: None (Preliminary result)   Collection Time: 10/14/17  9:50 PM  Result Value Ref Range Status   Specimen Description BLOOD RIGHT ARM  Final   Special Requests   Final    BOTTLES DRAWN AEROBIC AND ANAEROBIC Blood Culture adequate volume   Culture   Final    NO GROWTH 2 DAYS Performed at [facility], [place] [address] [place], [place] [address]    Report Status PENDING  Incomplete  Blood Culture (routine x 2)     Status: None (Preliminary result)   Collection Time: 10/14/17  9:57 PM  Result Value Ref Range Status   Specimen Description BLOOD RIGHT HAND  Final   Special Requests IN PEDIATRIC  BOTTLE Blood Culture adequate volume  Final   Culture   Final    NO GROWTH 2 DAYS Performed at [facility], [place] [address] [place], [place] [address]    Report Status PENDING  Incomplete         Radiology Studies: Dg Pelvis 1-2 Views  Result Date: 10/15/2017 CLINICAL DATA:  84 year old male with fall and left-sided hip pain. EXAM: PELVIS - 1-2 VIEW COMPARISON:  Sacral bone radiograph dated 01/21/2017 FINDINGS: There is no acute fracture or dislocation. The bones are osteopenic. Mild arthritic changes of the hips. Degenerative changes of the lower lumbar spine. The soft tissues appear unremarkable. IMPRESSION: No acute fracture or dislocation. Electronically Signed   By: Anner Crete M.D.   On: 10/15/2017 00:54   Ct Head Wo Contrast  Result Date: 10/14/2017 CLINICAL DATA:  Fall.  Swelling over right orbit EXAM: CT HEAD WITHOUT CONTRAST CT MAXILLOFACIAL WITHOUT CONTRAST CT CERVICAL SPINE WITHOUT CONTRAST TECHNIQUE: Multidetector CT imaging of the head, cervical spine, and maxillofacial structures were performed using the standard protocol without intravenous contrast. Multiplanar CT image reconstructions of the cervical spine and  maxillofacial structures were also generated. COMPARISON:  10/04/2016 FINDINGS: CT HEAD FINDINGS Brain: There is atrophy and chronic small vessel disease changes. No acute intracranial abnormality. Specifically, no hemorrhage, hydrocephalus, mass lesion, acute infarction, or significant intracranial injury. Vascular: No hyperdense vessel or unexpected calcification. Skull: No acute calvarial abnormality. Other: Marked soft tissue swelling over the right orbit and forehead. No underlying bony abnormality. CT MAXILLOFACIAL FINDINGS Osseous: No fracture or mandibular dislocation. No destructive process. Orbits: Negative. No traumatic or inflammatory finding. Sinuses: Mucosal thickening throughout the paranasal sinuses. No air-fluid levels. Mastoid air cells are clear. Prior mastoidectomy on the right. Soft tissues: Marked soft tissue swelling over the right orbit and forehead. Globes are intact. CT CERVICAL SPINE FINDINGS Alignment: Loss of cervical lordosis.  No subluxation. Skull base and vertebrae: No fracture Soft tissues and spinal canal: Prevertebral soft tissues are normal. No epidural or paraspinal hematoma. Disc levels: Advanced degenerative disc and diffuse degenerative facet disease throughout the cervical spine. Upper chest: No acute findings. Other: No acute findings IMPRESSION: No acute intracranial abnormality. Atrophy, chronic microvascular disease. No evidence of facial, orbital, or cervical spine fracture. Marked soft tissue swelling over the right orbit and forehead. Chronic sinusitis. Advanced degenerative disc and facet disease in the cervical spine. Loss of cervical lordosis, likely degenerative in nature. No fracture or subluxation. Electronically Signed   By: Rolm Baptise M.D.   On: 10/14/2017 20:22   Ct Cervical Spine Wo Contrast  Result Date: 10/14/2017 CLINICAL DATA:  Fall.  Swelling over right orbit EXAM: CT HEAD WITHOUT CONTRAST CT MAXILLOFACIAL WITHOUT CONTRAST CT CERVICAL SPINE  WITHOUT CONTRAST TECHNIQUE: Multidetector CT imaging of the head, cervical spine, and maxillofacial structures were performed using the standard protocol without intravenous contrast. Multiplanar CT image reconstructions of the cervical spine and maxillofacial structures were also generated. COMPARISON:  10/04/2016 FINDINGS: CT HEAD FINDINGS Brain: There is atrophy and chronic small vessel disease changes. No acute intracranial abnormality. Specifically, no hemorrhage, hydrocephalus, mass lesion, acute infarction, or significant intracranial injury. Vascular: No hyperdense vessel or unexpected calcification. Skull: No acute calvarial abnormality. Other: Marked soft tissue swelling over the right orbit and forehead. No underlying bony abnormality. CT MAXILLOFACIAL FINDINGS Osseous: No fracture or mandibular dislocation. No destructive process. Orbits: Negative. No traumatic or inflammatory finding. Sinuses: Mucosal thickening throughout the paranasal sinuses. No air-fluid levels. Mastoid air cells are  clear. Prior mastoidectomy on the right. Soft tissues: Marked soft tissue swelling over the right orbit and forehead. Globes are intact. CT CERVICAL SPINE FINDINGS Alignment: Loss of cervical lordosis.  No subluxation. Skull base and vertebrae: No fracture Soft tissues and spinal canal: Prevertebral soft tissues are normal. No epidural or paraspinal hematoma. Disc levels: Advanced degenerative disc and diffuse degenerative facet disease throughout the cervical spine. Upper chest: No acute findings. Other: No acute findings IMPRESSION: No acute intracranial abnormality. Atrophy, chronic microvascular disease. No evidence of facial, orbital, or cervical spine fracture. Marked soft tissue swelling over the right orbit and forehead. Chronic sinusitis. Advanced degenerative disc and facet disease in the cervical spine. Loss of cervical lordosis, likely degenerative in nature. No fracture or subluxation. Electronically Signed    By: Rolm Baptise M.D.   On: 10/14/2017 20:22   Ct Maxillofacial Wo Contrast  Result Date: 10/14/2017 CLINICAL DATA:  Fall.  Swelling over right orbit EXAM: CT HEAD WITHOUT CONTRAST CT MAXILLOFACIAL WITHOUT CONTRAST CT CERVICAL SPINE WITHOUT CONTRAST TECHNIQUE: Multidetector CT imaging of the head, cervical spine, and maxillofacial structures were performed using the standard protocol without intravenous contrast. Multiplanar CT image reconstructions of the cervical spine and maxillofacial structures were also generated. COMPARISON:  10/04/2016 FINDINGS: CT HEAD FINDINGS Brain: There is atrophy and chronic small vessel disease changes. No acute intracranial abnormality. Specifically, no hemorrhage, hydrocephalus, mass lesion, acute infarction, or significant intracranial injury. Vascular: No hyperdense vessel or unexpected calcification. Skull: No acute calvarial abnormality. Other: Marked soft tissue swelling over the right orbit and forehead. No underlying bony abnormality. CT MAXILLOFACIAL FINDINGS Osseous: No fracture or mandibular dislocation. No destructive process. Orbits: Negative. No traumatic or inflammatory finding. Sinuses: Mucosal thickening throughout the paranasal sinuses. No air-fluid levels. Mastoid air cells are clear. Prior mastoidectomy on the right. Soft tissues: Marked soft tissue swelling over the right orbit and forehead. Globes are intact. CT CERVICAL SPINE FINDINGS Alignment: Loss of cervical lordosis.  No subluxation. Skull base and vertebrae: No fracture Soft tissues and spinal canal: Prevertebral soft tissues are normal. No epidural or paraspinal hematoma. Disc levels: Advanced degenerative disc and diffuse degenerative facet disease throughout the cervical spine. Upper chest: No acute findings. Other: No acute findings IMPRESSION: No acute intracranial abnormality. Atrophy, chronic microvascular disease. No evidence of facial, orbital, or cervical spine fracture. Marked soft tissue  swelling over the right orbit and forehead. Chronic sinusitis. Advanced degenerative disc and facet disease in the cervical spine. Loss of cervical lordosis, likely degenerative in nature. No fracture or subluxation. Electronically Signed   By: Rolm Baptise M.D.   On: 10/14/2017 20:22        Scheduled Meds: . amiodarone  200 mg Oral Daily  . apixaban  5 mg Oral BID  . atorvastatin  20 mg Oral q1800  . carvedilol  3.125 mg Oral BID WC  . furosemide  20 mg Oral Daily  . gabapentin  300 mg Oral Daily  . insulin aspart  0-9 Units Subcutaneous TID WC  . omega-3 acid ethyl esters  1,000 mg Oral Daily  . vitamin B-12  1,000 mcg Oral Daily   Continuous Infusions: . aztreonam 2 g (10/16/17 0629)  . vancomycin Stopped (10/16/17 0630)     LOS: 0 days    Time spent: 40 minutes    Edwin Dada, MD [facility] 10/16/2017, 12:53 PM     Pager [phone] --- please page though AMION:  [email or web address] If 7PM-7AM, please contact night-coverage

## 2017-10-16 NOTE — Plan of Care (Signed)
  Education: Knowledge of General Education information will improve 10/16/2017 2355 - [place]ing by Theador Hawthorne, RN   Clinical Measurements: Will remain free from infection 10/16/2017 2355 - [place]ing by Theador Hawthorne, RN   Activity: Risk for activity intolerance will decrease 10/16/2017 2355 - [place]ing by Theador Hawthorne, RN   Safety: Ability to remain free from injury will improve 10/16/2017 2355 - [place]ing by Theador Hawthorne, RN   Skin Integrity: Risk for impaired skin integrity will decrease 10/16/2017 2355 - [place]ing by Theador Hawthorne, RN

## 2017-10-17 LAB — BASIC METABOLIC PANEL
Anion gap: 10 (ref 5–15)
BUN: 13 mg/dL (ref 6–20)
CHLORIDE: 101 mmol/L (ref 101–111)
CO2: 26 mmol/L (ref 22–32)
CREATININE: 0.96 mg/dL (ref 0.61–1.24)
Calcium: 8.6 mg/dL — ABNORMAL LOW (ref 8.9–10.3)
GFR calc Af Amer: >60 mL/min (ref >60)
GFR calc non Af Amer: >60 mL/min (ref >60)
GLUCOSE: 105 mg/dL — AB (ref 65–99)
Potassium: 3.7 mmol/L (ref 3.5–5.1)
Sodium: 137 mmol/L (ref 135–145)

## 2017-10-17 LAB — CBC
HEMATOCRIT: 36.6 % — AB (ref 39.0–52.0)
HEMOGLOBIN: 12.1 g/dL — AB (ref 13.0–17.0)
MCH: 30.8 pg (ref 26.0–34.0)
MCHC: 33.1 g/dL (ref 30.0–36.0)
MCV: 93.1 fL (ref 78.0–100.0)
Platelets: 119 10*3/uL — ABNORMAL LOW (ref 150–400)
RBC: 3.93 MIL/uL — ABNORMAL LOW (ref 4.22–5.81)
RDW: 13.2 % (ref 11.5–15.5)
WBC: 5.6 10*3/uL (ref 4.0–10.5)

## 2017-10-17 LAB — GLUCOSE, CAPILLARY
Glucose-Capillary: 96 mg/dL (ref 65–99)
Glucose-Capillary: 113 mg/dL — ABNORMAL HIGH (ref 65–99)

## 2017-10-17 LAB — LEGIONELLA PNEUMOPHILA SEROGP 1 UR AG: L. PNEUMOPHILA SEROGP 1 UR AG: NEGATIVE

## 2017-10-17 MED ORDER — LEVOFLOXACIN 500 MG PO TABS
500.0000 mg | ORAL_TABLET | Freq: Every day | ORAL | Status: DC
  Administered 2017-10-17: 500 mg via ORAL
  Filled 2017-10-17: qty 1

## 2017-10-17 MED ORDER — LEVOFLOXACIN 500 MG PO TABS: 500.0000 mg | ORAL_TABLET | Freq: Every day | ORAL | 0 refills | Status: DC

## 2017-10-17 MED ORDER — ATORVASTATIN CALCIUM 20 MG PO TABS: 20.0000 mg | ORAL_TABLET | Freq: Every day | ORAL | 0 refills | Status: DC

## 2017-10-17 NOTE — [place] Notes (Signed)
Physical Therapy Treatment Patient Details Name: Dylan [name] MRN: [id number] DOB: [birth year] Today's Date: 10/17/2017    History of Present Illness Pt is an 84 y.o. male s/p recent pacemaker placement (10/05/17) admitted 10/14/17 after fall at home sustaining hematoma around R eye. CT shows no acute intracranial abnormality. CXR concerning for pneumonia. PMH includes CHF, DMII, back sx (2008).    PT Comments    Patient received up in chair, pleasant and willing to work with skilled PT services after encouragement was provided by wife and therapist. Began with functional LE exercises in chair to address functional strength, then proceeded to gait train approximately 435f with rollator today, patient much less impulsive and with improved safety awareness but does require intermittent cues for safety. Upon return to room he did have a bout of explosive diarrhea, able to move into bathroom quickly without assistive device and Min guard; able to clean self and change socks with S in bathroom, housekeeping asked to come address room. He was left up in the chair with all needs met, family present this morning.     Follow Up Recommendations  Home health PT;Supervision for mobility/OOB     Equipment Recommendations  None recommended by PT    Recommendations for Other Services       Precautions / Restrictions Precautions Precautions: Fall;ICD/Pacemaker Precaution Comments: s/p pacemaker 10/05/17 Restrictions Weight Bearing Restrictions: No    Mobility  Bed Mobility               General bed mobility comments: DNT, received up in chair   Transfers Overall transfer level: Needs assistance Equipment used: 4-wheeled walker Transfers: Sit to/from Stand Sit to Stand: Supervision         General transfer comment: cues for sequencing and safety during transfer   Ambulation/Gait Ambulation/Gait assistance: Supervision Ambulation Distance (Feet): 400 Feet Assistive device:  4-wheeled walker Gait Pattern/deviations: Step-through pattern;Decreased stride length;Trunk flexed     General Gait Details: steady with rollator, gait much less impulsive today   Stairs            Wheelchair Mobility    Modified Rankin (Stroke Patients Only)       Balance Overall balance assessment: Needs assistance Sitting-balance support: Feet supported;No upper extremity supported Sitting balance-[name]: Good     Standing balance support: Bilateral upper extremity supported;During functional activity Standing balance-[name]: Good Standing balance comment: S for safety                            Cognition Arousal/Alertness: Awake/alert Behavior During Therapy: WFL for tasks assessed/performed Overall Cognitive Status: Impaired/Different from baseline Area of Impairment: Attention;Memory;Safety/judgement                   Current Attention Level: Selective Memory: Decreased short-term memory   Safety/Judgement: Decreased awareness of safety     General Comments: patient much safer and less impulsive today, does require cues for intermittent safety however       Exercises      General Comments        Pertinent Vitals/Pain Pain Assessment: No/denies pain Pain Score: 0-No pain    Home Living                      Prior Function            PT Goals (current goals can now be found in the care plan section) Acute Rehab PT  Goals Patient Stated Goal: Return home PT Goal Formulation: With patient Time For Goal Achievement: 10/29/17 Potential to Achieve Goals: Good Progress towards PT goals: Progressing toward goals    Frequency    Min 3X/week      PT Plan Current plan remains appropriate    Co-evaluation              AM-PAC PT "6 Clicks" Daily Activity  Outcome Measure  Difficulty turning over in bed (including adjusting bedclothes, sheets and blankets)?: None Difficulty moving from lying on  back to sitting on the side of the bed? : None Difficulty sitting down on and standing up from a chair with arms (e.g., wheelchair, bedside commode, etc,.)?: None Help needed moving to and from a bed to chair (including a wheelchair)?: None Help needed walking in hospital room?: A Little Help needed climbing 3-5 steps with a railing? : A Little 6 Click Score: 22    End of Session Equipment Utilized During Treatment: Gait belt Activity Tolerance: Patient tolerated treatment well Patient left: in chair;with call bell/phone within reach;with family/visitor present   PT Visit Diagnosis: Other abnormalities of gait and mobility (R26.89)     Time: 3014-1597 PT Time Calculation (min) (ACUTE ONLY): 33 min  Charges:  $Gait Training: 8-22 mins $Therapeutic Activity: 8-22 mins                    G Codes:       Deniece Ree PT, DPT, CBIS  Supplemental Physical Therapist [place]   Pager [phone]

## 2017-10-17 NOTE — [place] Notes (Signed)
PHARMACIST - PHYSICIAN COMMUNICATION  CONCERNING: Antibiotic IV to Oral Route Change Policy  RECOMMENDATION: This patient is receiving levaquin by the intravenous route.  Based on criteria approved by the Pharmacy and Therapeutics Committee, the antibiotic(s) is/are being converted to the equivalent oral dose form(s).   DESCRIPTION: These criteria include:  Patient being treated for a respiratory tract infection, urinary tract infection, cellulitis or clostridium difficile associated diarrhea if on metronidazole  The patient is not neutropenic and does not exhibit a GI malabsorption state  The patient is eating (either orally or via tube) and/or has been taking other orally administered medications for a least 24 hours  The patient is improving clinically and has a Tmax < 100.5  If you have questions about this conversion, please contact the Pharmacy Department  []   [phone] )  [facility] []   [phone] )  [facility] [x]   [phone] )  [facility] []   [phone] )  [facility] []   [phone] )  [place], PharmD Clinical Pharmacist Clinical phone from 8:30-4:00 is [phone] After 4pm, please call Main Rx [phone]) for assistance. 10/17/2017 7:54 AM

## 2017-10-18 ENCOUNTER — Telehealth (HOSPITAL_COMMUNITY): Payer: Self-pay

## 2017-10-18 DIAGNOSIS — Z87891 Personal history of nicotine dependence: Secondary | ICD-10-CM | POA: Diagnosis not present

## 2017-10-18 DIAGNOSIS — J189 Pneumonia, unspecified organism: Secondary | ICD-10-CM | POA: Diagnosis not present

## 2017-10-18 DIAGNOSIS — I130 Hypertensive heart and chronic kidney disease with heart failure and stage 1 through stage 4 chronic kidney disease, or unspecified chronic kidney disease: Secondary | ICD-10-CM | POA: Diagnosis not present

## 2017-10-18 DIAGNOSIS — E119 Type 2 diabetes mellitus without complications: Secondary | ICD-10-CM | POA: Diagnosis not present

## 2017-10-18 DIAGNOSIS — I5043 Acute on chronic combined systolic (congestive) and diastolic (congestive) heart failure: Secondary | ICD-10-CM | POA: Diagnosis not present

## 2017-10-18 DIAGNOSIS — N183 Chronic kidney disease, stage 3 (moderate): Secondary | ICD-10-CM | POA: Diagnosis not present

## 2017-10-18 DIAGNOSIS — R911 Solitary pulmonary nodule: Secondary | ICD-10-CM | POA: Diagnosis not present

## 2017-10-18 DIAGNOSIS — E785 Hyperlipidemia, unspecified: Secondary | ICD-10-CM | POA: Diagnosis not present

## 2017-10-18 DIAGNOSIS — I4891 Unspecified atrial fibrillation: Secondary | ICD-10-CM | POA: Diagnosis not present

## 2017-10-18 DIAGNOSIS — S0511XD Contusion of eyeball and orbital tissues, right eye, subsequent encounter: Secondary | ICD-10-CM | POA: Diagnosis not present

## 2017-10-18 NOTE — Discharge Summary (Addendum)
Physician Discharge Summary  Dylan [name] NTZ:[id number] DOB: [birth year] DOA: 10/14/2017  PCP: Haywood Pao, MD  Admit date: 10/14/2017 Discharge date: 10/18/2017  Admitted From: Home  Disposition:  Home   Recommendations for Outpatient Follow-up:  1. Follow up with PCP in 1-2 weeks 2. Follow up with Cardiology early next week   Home Health: Yes  Equipment/Devices: Walker  Discharge Condition: Fair  CODE STATUS: FULL Diet recommendation: Cardiac  Brief/Interim Summary: Mr. Soules is a 84 yo M with Afib on amiodarone, SSS with pacer, chronic diastolic CHF, HTN, CKD III, and NIDDM who presents with several days cough, then fall and orthostasis.    On the day of admission, he had been to his primary cardiologist who noted his cough and malaise.  Recommended CXR and treatment of pneumonia if present.  It is not clear if this CXR happened initially, but the patient went home, was alone for a minute while his wife was out and when she came back she found that he had fallen face down on the ground, having gotten very lightheaded when he tried to stand without his walker.     Community Acquired pneumonia left lower lobe. Opacity in LLL, rales on exam.  PSI is >90, risk class IV. Weaned off oxygen on day of discharge, transitioned to finish course with Levaquin.  Orthostasis Fall Right periorbital hematoma Fell on day of admission without LOC.  CT head and cspine and maxilla showed no fractures, intracranial hemorrhage.  Apixaban was stopped 3 days given concern for rapidly expanding periorbital hematoma, but no blood collection was noted and his apixaban was restarted.     Discharge Diagnoses:  Principal Problem:   HCAP (healthcare-associated pneumonia) Active Problems:   DM II (diabetes mellitus, type II), controlled ([place])   Atrial fibrillation ([place])   Chronic diastolic heart failure (HCC)   Periorbital hematoma of left eye   Fall    Discharge  Instructions  Discharge Instructions    Diet - low sodium heart healthy   Complete by:  As directed    Discharge instructions   Complete by:  As directed    From Dr. Loleta Books: You were admitted for passing out due to blood pressure drops with standing, and were also found to have pneumonia.  For the blood pressure drops with standing: -You were given IV fluids and your Lasix was held. -You should hold your Lasix for two more days after you leave, then restart at 20 mg once daily  For the pneumonia: -You were treated with antibiotics. -You should finish the course of antibiotics by taking: Levofloxacin/Levaquin 500 mg once daily in the morning for two more days, starting Friday   ALSO:  You should stop taking simvastatin/Zocor for your cholesterol, and replace it with the new prescription for atorvastatin/Lipitor (this is an alternative cholesterol medicine, which our pharmacist feels is preferable when taking amiodarone, because the two have a mild interaction)   Increase activity slowly   Complete by:  As directed      Allergies as of 10/17/2017      Reactions   Keflex [cephalexin] Hives, Other (See Comments)   Occurred in the 1980's      Medication List    STOP taking these medications   guaiFENesin 100 MG/5ML liquid Commonly known as:  ROBITUSSIN   pseudoephedrine-guaifenesin 60-600 MG 12 hr tablet Commonly known as:  MUCINEX D   simvastatin 40 MG tablet Commonly known as:  ZOCOR     TAKE these medications  amiodarone 200 MG tablet Commonly known as:  PACERONE Take 1 tablet (200 mg total) by mouth daily.   apixaban 5 MG Tabs tablet Commonly known as:  ELIQUIS Take 1 tablet (5 mg total) by mouth 2 (two) times daily.   atorvastatin 20 MG tablet Commonly known as:  LIPITOR Take 1 tablet (20 mg total) by mouth daily at 6 PM.   carvedilol 3.125 MG tablet Commonly known as:  COREG Take 1 tablet (3.125 mg total) by mouth 2 (two) times daily with a meal.   Fish  Oil 1200 MG Caps Take 1,200 mg by mouth 2 (two) times daily.   furosemide 20 MG tablet Commonly known as:  LASIX Take 1 tablet (20 mg total) by mouth daily.   gabapentin 300 MG capsule Commonly known as:  NEURONTIN Take 300 mg by mouth daily.   levofloxacin 500 MG tablet Commonly known as:  LEVAQUIN Take 1 tablet (500 mg total) by mouth daily.   metFORMIN 500 MG 24 hr tablet Commonly known as:  GLUCOPHAGE-XR Take 500 mg by mouth daily with breakfast.   vitamin B-12 1000 MCG tablet Commonly known as:  CYANOCOBALAMIN Take 1,000 mcg by mouth daily.   Vitamin D 2000 units Caps Take 2,000 Units by mouth daily.      [facility] Follow up.   Why:  they will do your home health physical therapy at your home Contact information: [address] [place] [address] [phone]        Tisovec, Fransico Him, MD.   Specialty:  Internal Medicine Why:  Left message with Kingsley Spittle information: [address] [place] [address] [phone]          Allergies  Allergen Reactions  . Keflex [Cephalexin] Hives and Other (See Comments)    Occurred in the 1980's    Consultations:  None   Procedures/Studies: Dg Chest 2 View  Result Date: 10/14/2017 CLINICAL DATA:  Cough and shortness of breath. EXAM: CHEST  2 VIEW COMPARISON:  Chest x-ray dated October 06, 2017. FINDINGS: Unchanged left chest wall AICD. Stable mild cardiomegaly. Normal pulmonary vascularity. New subtle patchy density in the left lower lobe. No pleural effusion or pneumothorax. No acute osseous abnormality. IMPRESSION: New subtle patchy opacity in the left lower lobe, concerning for pneumonia. Electronically Signed   By: Titus Dubin M.D.   On: 10/14/2017 18:35   Dg Chest 2 View  Result Date: 10/06/2017 CLINICAL DATA:  Pacer device. EXAM: CHEST  2 VIEW COMPARISON:  10/05/2017 FINDINGS: AP and lateral views of the chest show no evidence for  pneumothorax. No pulmonary edema or pleural effusion. The cardio pericardial silhouette is enlarged. Left-sided permanent pacemaker again noted. The visualized bony structures of the thorax are intact. IMPRESSION: No active cardiopulmonary disease. Electronically Signed   By: Misty Stanley M.D.   On: 10/06/2017 08:45   Dg Pelvis 1-2 Views  Result Date: 10/15/2017 CLINICAL DATA:  84 year old male with fall and left-sided hip pain. EXAM: PELVIS - 1-2 VIEW COMPARISON:  Sacral bone radiograph dated 01/21/2017 FINDINGS: There is no acute fracture or dislocation. The bones are osteopenic. Mild arthritic changes of the hips. Degenerative changes of the lower lumbar spine. The soft tissues appear unremarkable. IMPRESSION: No acute fracture or dislocation. Electronically Signed   By: Anner Crete M.D.   On: 10/15/2017 00:54   Ct Head Wo Contrast  Result Date: 10/14/2017 CLINICAL DATA:  Fall.  Swelling over right orbit  EXAM: CT HEAD WITHOUT CONTRAST CT MAXILLOFACIAL WITHOUT CONTRAST CT CERVICAL SPINE WITHOUT CONTRAST TECHNIQUE: Multidetector CT imaging of the head, cervical spine, and maxillofacial structures were performed using the standard protocol without intravenous contrast. Multiplanar CT image reconstructions of the cervical spine and maxillofacial structures were also generated. COMPARISON:  10/04/2016 FINDINGS: CT HEAD FINDINGS Brain: There is atrophy and chronic small vessel disease changes. No acute intracranial abnormality. Specifically, no hemorrhage, hydrocephalus, mass lesion, acute infarction, or significant intracranial injury. Vascular: No hyperdense vessel or unexpected calcification. Skull: No acute calvarial abnormality. Other: Marked soft tissue swelling over the right orbit and forehead. No underlying bony abnormality. CT MAXILLOFACIAL FINDINGS Osseous: No fracture or mandibular dislocation. No destructive process. Orbits: Negative. No traumatic or inflammatory finding. Sinuses: Mucosal  thickening throughout the paranasal sinuses. No air-fluid levels. Mastoid air cells are clear. Prior mastoidectomy on the right. Soft tissues: Marked soft tissue swelling over the right orbit and forehead. Globes are intact. CT CERVICAL SPINE FINDINGS Alignment: Loss of cervical lordosis.  No subluxation. Skull base and vertebrae: No fracture Soft tissues and spinal canal: Prevertebral soft tissues are normal. No epidural or paraspinal hematoma. Disc levels: Advanced degenerative disc and diffuse degenerative facet disease throughout the cervical spine. Upper chest: No acute findings. Other: No acute findings IMPRESSION: No acute intracranial abnormality. Atrophy, chronic microvascular disease. No evidence of facial, orbital, or cervical spine fracture. Marked soft tissue swelling over the right orbit and forehead. Chronic sinusitis. Advanced degenerative disc and facet disease in the cervical spine. Loss of cervical lordosis, likely degenerative in nature. No fracture or subluxation. Electronically Signed   By: Rolm Baptise M.D.   On: 10/14/2017 20:22   Ct Cervical Spine Wo Contrast  Result Date: 10/14/2017 CLINICAL DATA:  Fall.  Swelling over right orbit EXAM: CT HEAD WITHOUT CONTRAST CT MAXILLOFACIAL WITHOUT CONTRAST CT CERVICAL SPINE WITHOUT CONTRAST TECHNIQUE: Multidetector CT imaging of the head, cervical spine, and maxillofacial structures were performed using the standard protocol without intravenous contrast. Multiplanar CT image reconstructions of the cervical spine and maxillofacial structures were also generated. COMPARISON:  10/04/2016 FINDINGS: CT HEAD FINDINGS Brain: There is atrophy and chronic small vessel disease changes. No acute intracranial abnormality. Specifically, no hemorrhage, hydrocephalus, mass lesion, acute infarction, or significant intracranial injury. Vascular: No hyperdense vessel or unexpected calcification. Skull: No acute calvarial abnormality. Other: Marked soft tissue  swelling over the right orbit and forehead. No underlying bony abnormality. CT MAXILLOFACIAL FINDINGS Osseous: No fracture or mandibular dislocation. No destructive process. Orbits: Negative. No traumatic or inflammatory finding. Sinuses: Mucosal thickening throughout the paranasal sinuses. No air-fluid levels. Mastoid air cells are clear. Prior mastoidectomy on the right. Soft tissues: Marked soft tissue swelling over the right orbit and forehead. Globes are intact. CT CERVICAL SPINE FINDINGS Alignment: Loss of cervical lordosis.  No subluxation. Skull base and vertebrae: No fracture Soft tissues and spinal canal: Prevertebral soft tissues are normal. No epidural or paraspinal hematoma. Disc levels: Advanced degenerative disc and diffuse degenerative facet disease throughout the cervical spine. Upper chest: No acute findings. Other: No acute findings IMPRESSION: No acute intracranial abnormality. Atrophy, chronic microvascular disease. No evidence of facial, orbital, or cervical spine fracture. Marked soft tissue swelling over the right orbit and forehead. Chronic sinusitis. Advanced degenerative disc and facet disease in the cervical spine. Loss of cervical lordosis, likely degenerative in nature. No fracture or subluxation. Electronically Signed   By: Rolm Baptise M.D.   On: 10/14/2017 20:22   Dg Chest [facility] [address]  Result Date: 10/05/2017 CLINICAL DATA:  Rule out pneumothorax. EXAM: PORTABLE CHEST 1 VIEW COMPARISON:  Chest x-ray dated August 03, 2017. FINDINGS: Interval placement of a left chest wall AICD with leads terminating in the right atrium and right ventricle. Stable cardiomegaly. Normal pulmonary vascularity. Low lung volumes with bibasilar atelectasis. No focal consolidation, pleural effusion, or pneumothorax. No acute osseous abnormality. IMPRESSION: 1. Interval placement of a left chest wall AICD.  No pneumothorax. 2. Low lung volumes with bibasilar atelectasis. Electronically Signed   By:  Titus Dubin M.D.   On: 10/05/2017 14:01   Ct Maxillofacial Wo Contrast  Result Date: 10/14/2017 CLINICAL DATA:  Fall.  Swelling over right orbit EXAM: CT HEAD WITHOUT CONTRAST CT MAXILLOFACIAL WITHOUT CONTRAST CT CERVICAL SPINE WITHOUT CONTRAST TECHNIQUE: Multidetector CT imaging of the head, cervical spine, and maxillofacial structures were performed using the standard protocol without intravenous contrast. Multiplanar CT image reconstructions of the cervical spine and maxillofacial structures were also generated. COMPARISON:  10/04/2016 FINDINGS: CT HEAD FINDINGS Brain: There is atrophy and chronic small vessel disease changes. No acute intracranial abnormality. Specifically, no hemorrhage, hydrocephalus, mass lesion, acute infarction, or significant intracranial injury. Vascular: No hyperdense vessel or unexpected calcification. Skull: No acute calvarial abnormality. Other: Marked soft tissue swelling over the right orbit and forehead. No underlying bony abnormality. CT MAXILLOFACIAL FINDINGS Osseous: No fracture or mandibular dislocation. No destructive process. Orbits: Negative. No traumatic or inflammatory finding. Sinuses: Mucosal thickening throughout the paranasal sinuses. No air-fluid levels. Mastoid air cells are clear. Prior mastoidectomy on the right. Soft tissues: Marked soft tissue swelling over the right orbit and forehead. Globes are intact. CT CERVICAL SPINE FINDINGS Alignment: Loss of cervical lordosis.  No subluxation. Skull base and vertebrae: No fracture Soft tissues and spinal canal: Prevertebral soft tissues are normal. No epidural or paraspinal hematoma. Disc levels: Advanced degenerative disc and diffuse degenerative facet disease throughout the cervical spine. Upper chest: No acute findings. Other: No acute findings IMPRESSION: No acute intracranial abnormality. Atrophy, chronic microvascular disease. No evidence of facial, orbital, or cervical spine fracture. Marked soft tissue  swelling over the right orbit and forehead. Chronic sinusitis. Advanced degenerative disc and facet disease in the cervical spine. Loss of cervical lordosis, likely degenerative in nature. No fracture or subluxation. Electronically Signed   By: Rolm Baptise M.D.   On: 10/14/2017 20:22       Subjective: Feeling well.  Vision good.  Wants to go home.  No dyspnea, chest pain.  Mild cough, improving.  No sputum production.  No confusion.  Discharge Exam: Vitals:   10/17/17 0858 10/17/17 1214  BP:  112/64  Pulse:  66  Resp:  18  Temp:  97.6 F (36.4 C)  SpO2: 93% 95%   Vitals:   10/16/17 1933 10/17/17 0608 10/17/17 0858 10/17/17 1214  BP: (!) 146/76 (!) 146/82  112/64  Pulse: 61 85  66  Resp: 18 18  18   Temp: 98.4 F (36.9 C) 97.9 F (36.6 C)  97.6 F (36.4 C)  TempSrc: Oral Oral  Oral  SpO2: 94% 94% 93% 95%  Weight:  113.1 kg (249 lb 4.8 oz)    Height:        General: Pt is alert, awake, not in acute distress HEENT: Eye edema better.  Subconjunctival hemorrhage.  Visual acuity grossly normal Cardiovascular: RRR, S1/S2 +, no rubs, no gallops Respiratory: CTA bilaterally, no wheezing, no rhonchi Abdominal: Soft, NT, ND, bowel sounds + Extremities: no edema, no cyanosis  The results of significant diagnostics from this hospitalization (including imaging, microbiology, ancillary and laboratory) are listed below for reference.     Microbiology: Recent Results (from the past 240 hour(s))  Blood Culture (routine x 2)     Status: None (Preliminary result)   Collection Time: 10/14/17  9:50 PM  Result Value Ref Range Status   Specimen Description BLOOD RIGHT ARM  Final   Special Requests   Final    BOTTLES DRAWN AEROBIC AND ANAEROBIC Blood Culture adequate volume   Culture   Final    NO GROWTH 3 DAYS Performed at [facility], [address] [place], [place] [address]    Report Status PENDING  Incomplete  Blood Culture (routine x 2)     Status: None (Preliminary  result)   Collection Time: 10/14/17  9:57 PM  Result Value Ref Range Status   Specimen Description BLOOD RIGHT HAND  Final   Special Requests IN PEDIATRIC BOTTLE Blood Culture adequate volume  Final   Culture   Final    NO GROWTH 3 DAYS Performed at [facility], [place] [address], [place], [place] [address]    Report Status PENDING  Incomplete  MRSA PCR Screening     Status: None   Collection Time: 10/16/17  1:29 PM  Result Value Ref Range Status   MRSA by PCR NEGATIVE NEGATIVE Final    Comment:        The GeneXpert MRSA Assay (FDA approved for NASAL specimens only), is one component of a comprehensive MRSA colonization surveillance program. It is not intended to diagnose MRSA infection nor to guide or monitor treatment for MRSA infections. Performed at [facility], [place] [employer] [address], [place],  [address]      Labs: BNP (last 3 results) Recent Labs    07/14/17 1601 08/03/17 1158  BNP 341.8* 397.6*   Basic Metabolic Panel: Recent Labs  Lab 10/14/17 1221 10/14/17 2152 10/15/17 0352 10/17/17 0447  NA 131* 131* 132* 137  K 4.0 3.7 3.6 3.7  CL 96* 96* 96* 101  CO2 24 21* 22 26  GLUCOSE 96 124* 93 105*  BUN 13 15 15 13   CREATININE 1.12 1.19 1.09 0.96  CALCIUM 8.9 8.6* 8.2* 8.6*   Liver Function Tests: Recent Labs  Lab 10/14/17 1221 10/14/17 2152  AST 35 24  ALT 17 17  ALKPHOS 83 76  BILITOT 2.2* 1.9*  PROT 7.1 6.7  ALBUMIN 3.7 3.6   No results for input(s): LIPASE, AMYLASE in the last 168 hours. No results for input(s): AMMONIA in the last 168 hours. CBC: Recent Labs  Lab 10/14/17 1221 10/14/17 2152 10/15/17 0352 10/15/17 1330 10/16/17 0407 10/17/17 0447  WBC 5.8 6.3 6.0  --  3.8* 5.6  NEUTROABS  --  4.6  --   --   --   --   HGB 13.9 13.1 12.4* 12.0* 11.9* 12.1*  HCT 41.0 39.2 37.4* 35.3* 35.4* 36.6*  MCV 93.4 91.4 92.6  --  93.2 93.1  PLT 124* 114* 111*  --  99* 119*   Cardiac Enzymes: Recent Labs  Lab 10/15/17 0424   TROPONINI <0.03   BNP: Invalid input(s): POCBNP CBG: Recent Labs  Lab 10/16/17 1124 10/16/17 1648 10/16/17 2100 10/17/17 0727 10/17/17 1109  GLUCAP 124* 102* 103* 96 113*   D-Dimer No results for input(s): DDIMER in the last 72 hours. Hgb A1c No results for input(s): HGBA1C in the last 72 hours. Lipid Profile No results for  input(s): CHOL, HDL, LDLCALC, TRIG, CHOLHDL, LDLDIRECT in the last 72 hours. Thyroid function studies No results for input(s): TSH, T4TOTAL, T3FREE, THYROIDAB in the last 72 hours.  Invalid input(s): FREET3 Anemia work up No results for input(s): VITAMINB12, FOLATE, FERRITIN, TIBC, IRON, RETICCTPCT in the last 72 hours. Urinalysis    Component Value Date/Time   COLORURINE YELLOW 01/16/2017 2106   APPEARANCEUR CLEAR 01/16/2017 2106   LABSPEC 1.013 01/16/2017 2106   PHURINE 5.0 01/16/2017 2106   GLUCOSEU NEGATIVE 01/16/2017 2106   HGBUR NEGATIVE 01/16/2017 2106   BILIRUBINUR NEGATIVE 01/16/2017 2106   KETONESUR NEGATIVE 01/16/2017 2106   PROTEINUR NEGATIVE 01/16/2017 2106   UROBILINOGEN 1.0 11/22/2009 1423   NITRITE NEGATIVE 01/16/2017 2106   LEUKOCYTESUR MODERATE (A) 01/16/2017 2106   Sepsis Labs Invalid input(s): PROCALCITONIN,  WBC,  LACTICIDVEN Microbiology Recent Results (from the past 240 hour(s))  Blood Culture (routine x 2)     Status: None (Preliminary result)   Collection Time: 10/14/17  9:50 PM  Result Value Ref Range Status   Specimen Description BLOOD RIGHT ARM  Final   Special Requests   Final    BOTTLES DRAWN AEROBIC AND ANAEROBIC Blood Culture adequate volume   Culture   Final    NO GROWTH 3 DAYS Performed at [facility], [place] [address], [place], [place] [address]    Report Status PENDING  Incomplete  Blood Culture (routine x 2)     Status: None (Preliminary result)   Collection Time: 10/14/17  9:57 PM  Result Value Ref Range Status   Specimen Description BLOOD RIGHT HAND  Final   Special Requests IN PEDIATRIC  BOTTLE Blood Culture adequate volume  Final   Culture   Final    NO GROWTH 3 DAYS Performed at [facility], [place] [address], [place], [place] [address]    Report Status PENDING  Incomplete  MRSA PCR Screening     Status: None   Collection Time: 10/16/17  1:29 PM  Result Value Ref Range Status   MRSA by PCR NEGATIVE NEGATIVE Final    Comment:        The GeneXpert MRSA Assay (FDA approved for NASAL specimens only), is one component of a comprehensive MRSA colonization surveillance program. It is not intended to diagnose MRSA infection nor to guide or monitor treatment for MRSA infections. Performed at [facility], [place] [address], [place],  [address]      Time coordinating discharge: Over 30 minutes  SIGNED:   Edwin Dada, MD  [facility] 10/18/2017, 6:02 AM

## 2017-10-18 NOTE — Telephone Encounter (Addendum)
Advanced Heart Failure Triage Encounter  Patient Name: Dylan [name]  Date of Call: 10/18/17  Problem:  Pt wife called and left VM 2/28 about Pharmacy calling about amiodarone and Levaquin interaction. Pt was instructed to hold amiodarone for the 2 days he would need to complete Tx for PNA with Levaquin. Will forward info to Dr. Aundra Dubin  For further approval.   Plan: Dr. Aundra Dubin  Was informed and agreeable to plan. Pt informed and agreeable to start Amiodarone next day after last dose of ABX  Shirley Muscat, Therapist, sports

## 2017-10-19 DIAGNOSIS — I4891 Unspecified atrial fibrillation: Secondary | ICD-10-CM | POA: Diagnosis not present

## 2017-10-19 DIAGNOSIS — I130 Hypertensive heart and chronic kidney disease with heart failure and stage 1 through stage 4 chronic kidney disease, or unspecified chronic kidney disease: Secondary | ICD-10-CM | POA: Diagnosis not present

## 2017-10-19 DIAGNOSIS — E119 Type 2 diabetes mellitus without complications: Secondary | ICD-10-CM | POA: Diagnosis not present

## 2017-10-19 DIAGNOSIS — J189 Pneumonia, unspecified organism: Secondary | ICD-10-CM | POA: Diagnosis not present

## 2017-10-19 DIAGNOSIS — N183 Chronic kidney disease, stage 3 (moderate): Secondary | ICD-10-CM | POA: Diagnosis not present

## 2017-10-19 DIAGNOSIS — E785 Hyperlipidemia, unspecified: Secondary | ICD-10-CM | POA: Diagnosis not present

## 2017-10-19 DIAGNOSIS — I5043 Acute on chronic combined systolic (congestive) and diastolic (congestive) heart failure: Secondary | ICD-10-CM | POA: Diagnosis not present

## 2017-10-19 DIAGNOSIS — R911 Solitary pulmonary nodule: Secondary | ICD-10-CM | POA: Diagnosis not present

## 2017-10-19 DIAGNOSIS — S0511XD Contusion of eyeball and orbital tissues, right eye, subsequent encounter: Secondary | ICD-10-CM | POA: Diagnosis not present

## 2017-10-19 DIAGNOSIS — Z87891 Personal history of nicotine dependence: Secondary | ICD-10-CM | POA: Diagnosis not present

## 2017-10-19 LAB — CULTURE, BLOOD (ROUTINE X 2)
CULTURE: NO GROWTH
Culture: NO GROWTH
Special Requests: ADEQUATE
Special Requests: ADEQUATE

## 2017-10-21 ENCOUNTER — Ambulatory Visit (HOSPITAL_COMMUNITY)
Admission: RE | Admit: 2017-10-21 | Discharge: 2017-10-21 | Disposition: A | Discharge status: Home / Self Care | Payer: Medicare HMO | Source: Ambulatory Visit | Attending: Cardiology | Admitting: Cardiology

## 2017-10-21 ENCOUNTER — Encounter (HOSPITAL_COMMUNITY): Payer: Self-pay

## 2017-10-21 ENCOUNTER — Ambulatory Visit (INDEPENDENT_AMBULATORY_CARE_PROVIDER_SITE_OTHER): Payer: Medicare HMO | Admitting: *Deleted

## 2017-10-21 DIAGNOSIS — Z950 Presence of cardiac pacemaker: Secondary | ICD-10-CM | POA: Diagnosis not present

## 2017-10-21 DIAGNOSIS — I481 Persistent atrial fibrillation: Secondary | ICD-10-CM

## 2017-10-21 DIAGNOSIS — I4891 Unspecified atrial fibrillation: Secondary | ICD-10-CM | POA: Diagnosis not present

## 2017-10-21 DIAGNOSIS — I4892 Unspecified atrial flutter: Secondary | ICD-10-CM | POA: Insufficient documentation

## 2017-10-21 DIAGNOSIS — R9431 Abnormal electrocardiogram [ECG] [EKG]: Secondary | ICD-10-CM | POA: Insufficient documentation

## 2017-10-21 DIAGNOSIS — I4819 Other persistent atrial fibrillation: Secondary | ICD-10-CM

## 2017-10-21 LAB — CUP PACEART INCLINIC DEVICE CHECK
Brady Statistic AP VP Percent: 0.04 %
Brady Statistic AP VS Percent: 90.41 %
Brady Statistic AS VS Percent: 9.5 %
Brady Statistic RV Percent Paced: 13.42 %
Date Time Interrogation Session: 20190304111950
Implantable Lead Implant Date: 20190216
Implantable Lead Location: 753860
Implantable Lead Model: 5076
Implantable Lead Model: 5076
Implantable Pulse Generator Implant Date: 20190216
Lead Channel Pacing Threshold Amplitude: 1 V
Lead Channel Sensing Intrinsic Amplitude: 3.375 mV
Lead Channel Sensing Intrinsic Amplitude: 7.375 mV
Lead Channel Setting Pacing Pulse Width: 0.4 ms
Lead Channel Setting Sensing Sensitivity: 1.2 mV
MDC IDC LEAD IMPLANT DT: 20190216
MDC IDC LEAD LOCATION: 753859
MDC IDC MSMT BATTERY REMAINING LONGEVITY: 160 mo
MDC IDC MSMT BATTERY VOLTAGE: 3.21 V
MDC IDC MSMT LEADCHNL RA IMPEDANCE VALUE: 304 Ohm
MDC IDC MSMT LEADCHNL RA IMPEDANCE VALUE: 570 Ohm
MDC IDC MSMT LEADCHNL RV IMPEDANCE VALUE: 399 Ohm
MDC IDC MSMT LEADCHNL RV IMPEDANCE VALUE: 494 Ohm
MDC IDC MSMT LEADCHNL RV PACING THRESHOLD PULSEWIDTH: 0.4 ms
MDC IDC SET LEADCHNL RA PACING AMPLITUDE: 3.5 V
MDC IDC SET LEADCHNL RV PACING AMPLITUDE: 3.5 V
MDC IDC STAT BRADY AS VP PERCENT: 0.05 %
MDC IDC STAT BRADY RA PERCENT PACED: 47.62 %

## 2017-10-21 NOTE — Progress Notes (Signed)
Wound check appointment. Dermabond removed. Wound without redness or edema. Incision edges approximated, wound well healed. Normal device function. Thresholds, sensing, and impedances consistent with implant measurements. Device programmed at 3.5V with auto capture programmed on for extra safety margin until 3 month visit. Histogram distribution appropriate for patient and level of activity. Persistent AF since 10/14/17 (48.1% burden), +Eliquis and amiodarone, patient denies symptoms. No high ventricular rates noted. Patient educated about wound care, arm mobility, lifting restrictions. [employer] app setup today. ROV with SK on 01/02/18.

## 2017-10-21 NOTE — Progress Notes (Signed)
Patient came in for nurse visit for BP check and EKG.  Patient's BP stayed about the same while sitting and standing- 120/82 and 122/82. Patient did not feel dizzy while standing.    EKG reviewed by Dr. Aundra Dubin and no further changes at this time.  Patient will follow up in 2 weeks.

## 2017-10-21 NOTE — Patient Instructions (Signed)
EKG Reviewed by Dr. Aundra Dubin and no further changes for now.  Follow up with him in 2 weeks.

## 2017-10-22 ENCOUNTER — Telehealth: Payer: Self-pay | Admitting: *Deleted

## 2017-10-22 DIAGNOSIS — I130 Hypertensive heart and chronic kidney disease with heart failure and stage 1 through stage 4 chronic kidney disease, or unspecified chronic kidney disease: Secondary | ICD-10-CM | POA: Diagnosis not present

## 2017-10-22 DIAGNOSIS — H05239 Hemorrhage of unspecified orbit: Secondary | ICD-10-CM | POA: Diagnosis not present

## 2017-10-22 DIAGNOSIS — Z9181 History of falling: Secondary | ICD-10-CM | POA: Diagnosis not present

## 2017-10-22 DIAGNOSIS — I480 Paroxysmal atrial fibrillation: Secondary | ICD-10-CM | POA: Diagnosis not present

## 2017-10-22 DIAGNOSIS — Z6836 Body mass index (BMI) 36.0-36.9, adult: Secondary | ICD-10-CM | POA: Diagnosis not present

## 2017-10-22 DIAGNOSIS — E7800 Pure hypercholesterolemia, unspecified: Secondary | ICD-10-CM | POA: Diagnosis not present

## 2017-10-22 DIAGNOSIS — E785 Hyperlipidemia, unspecified: Secondary | ICD-10-CM | POA: Diagnosis not present

## 2017-10-22 DIAGNOSIS — Z87891 Personal history of nicotine dependence: Secondary | ICD-10-CM | POA: Diagnosis not present

## 2017-10-22 DIAGNOSIS — R911 Solitary pulmonary nodule: Secondary | ICD-10-CM | POA: Diagnosis not present

## 2017-10-22 DIAGNOSIS — I5043 Acute on chronic combined systolic (congestive) and diastolic (congestive) heart failure: Secondary | ICD-10-CM | POA: Diagnosis not present

## 2017-10-22 DIAGNOSIS — J181 Lobar pneumonia, unspecified organism: Secondary | ICD-10-CM | POA: Diagnosis not present

## 2017-10-22 DIAGNOSIS — N183 Chronic kidney disease, stage 3 (moderate): Secondary | ICD-10-CM | POA: Diagnosis not present

## 2017-10-22 DIAGNOSIS — S0511XD Contusion of eyeball and orbital tissues, right eye, subsequent encounter: Secondary | ICD-10-CM | POA: Diagnosis not present

## 2017-10-22 DIAGNOSIS — E1151 Type 2 diabetes mellitus with diabetic peripheral angiopathy without gangrene: Secondary | ICD-10-CM | POA: Diagnosis not present

## 2017-10-22 DIAGNOSIS — E119 Type 2 diabetes mellitus without complications: Secondary | ICD-10-CM | POA: Diagnosis not present

## 2017-10-22 DIAGNOSIS — I10 Essential (primary) hypertension: Secondary | ICD-10-CM | POA: Diagnosis not present

## 2017-10-22 DIAGNOSIS — I4891 Unspecified atrial fibrillation: Secondary | ICD-10-CM | POA: Diagnosis not present

## 2017-10-22 DIAGNOSIS — J189 Pneumonia, unspecified organism: Secondary | ICD-10-CM | POA: Diagnosis not present

## 2017-10-22 NOTE — Telephone Encounter (Signed)
Spoke with patient's wife to attempt to troubleshoot [employer] app.  Troubleshooting was unsuccessful.  Advised patient's wife that app is transmitting successfully, but that she should call [facility] tech services for assistance as they cannot access the app on her phone.  Patient's wife took down number and will plan to call later today.  She is appreciative of assistance and denies additional questions or concerns at this time.

## 2017-10-22 NOTE — Telephone Encounter (Signed)
Spoke with patient's wife.  She reports that she was able to complete the [employer] app setup without further issue.  She is appreciative of assistance and denies questions or concerns at this time.

## 2017-10-24 DIAGNOSIS — J189 Pneumonia, unspecified organism: Secondary | ICD-10-CM | POA: Diagnosis not present

## 2017-10-24 DIAGNOSIS — E785 Hyperlipidemia, unspecified: Secondary | ICD-10-CM | POA: Diagnosis not present

## 2017-10-24 DIAGNOSIS — S0511XD Contusion of eyeball and orbital tissues, right eye, subsequent encounter: Secondary | ICD-10-CM | POA: Diagnosis not present

## 2017-10-24 DIAGNOSIS — Z87891 Personal history of nicotine dependence: Secondary | ICD-10-CM | POA: Diagnosis not present

## 2017-10-24 DIAGNOSIS — I130 Hypertensive heart and chronic kidney disease with heart failure and stage 1 through stage 4 chronic kidney disease, or unspecified chronic kidney disease: Secondary | ICD-10-CM | POA: Diagnosis not present

## 2017-10-24 DIAGNOSIS — I4891 Unspecified atrial fibrillation: Secondary | ICD-10-CM | POA: Diagnosis not present

## 2017-10-24 DIAGNOSIS — N183 Chronic kidney disease, stage 3 (moderate): Secondary | ICD-10-CM | POA: Diagnosis not present

## 2017-10-24 DIAGNOSIS — I5043 Acute on chronic combined systolic (congestive) and diastolic (congestive) heart failure: Secondary | ICD-10-CM | POA: Diagnosis not present

## 2017-10-24 DIAGNOSIS — R911 Solitary pulmonary nodule: Secondary | ICD-10-CM | POA: Diagnosis not present

## 2017-10-24 DIAGNOSIS — E119 Type 2 diabetes mellitus without complications: Secondary | ICD-10-CM | POA: Diagnosis not present

## 2017-10-26 DIAGNOSIS — E119 Type 2 diabetes mellitus without complications: Secondary | ICD-10-CM | POA: Diagnosis not present

## 2017-10-26 DIAGNOSIS — I5043 Acute on chronic combined systolic (congestive) and diastolic (congestive) heart failure: Secondary | ICD-10-CM | POA: Diagnosis not present

## 2017-10-26 DIAGNOSIS — I130 Hypertensive heart and chronic kidney disease with heart failure and stage 1 through stage 4 chronic kidney disease, or unspecified chronic kidney disease: Secondary | ICD-10-CM | POA: Diagnosis not present

## 2017-10-26 DIAGNOSIS — I4891 Unspecified atrial fibrillation: Secondary | ICD-10-CM | POA: Diagnosis not present

## 2017-10-26 DIAGNOSIS — R911 Solitary pulmonary nodule: Secondary | ICD-10-CM | POA: Diagnosis not present

## 2017-10-26 DIAGNOSIS — N183 Chronic kidney disease, stage 3 (moderate): Secondary | ICD-10-CM | POA: Diagnosis not present

## 2017-10-26 DIAGNOSIS — J189 Pneumonia, unspecified organism: Secondary | ICD-10-CM | POA: Diagnosis not present

## 2017-10-26 DIAGNOSIS — E785 Hyperlipidemia, unspecified: Secondary | ICD-10-CM | POA: Diagnosis not present

## 2017-10-26 DIAGNOSIS — Z87891 Personal history of nicotine dependence: Secondary | ICD-10-CM | POA: Diagnosis not present

## 2017-10-26 DIAGNOSIS — S0511XD Contusion of eyeball and orbital tissues, right eye, subsequent encounter: Secondary | ICD-10-CM | POA: Diagnosis not present

## 2017-10-28 DIAGNOSIS — Z87891 Personal history of nicotine dependence: Secondary | ICD-10-CM | POA: Diagnosis not present

## 2017-10-28 DIAGNOSIS — I130 Hypertensive heart and chronic kidney disease with heart failure and stage 1 through stage 4 chronic kidney disease, or unspecified chronic kidney disease: Secondary | ICD-10-CM | POA: Diagnosis not present

## 2017-10-28 DIAGNOSIS — J189 Pneumonia, unspecified organism: Secondary | ICD-10-CM | POA: Diagnosis not present

## 2017-10-28 DIAGNOSIS — I4891 Unspecified atrial fibrillation: Secondary | ICD-10-CM | POA: Diagnosis not present

## 2017-10-28 DIAGNOSIS — R911 Solitary pulmonary nodule: Secondary | ICD-10-CM | POA: Diagnosis not present

## 2017-10-28 DIAGNOSIS — I5043 Acute on chronic combined systolic (congestive) and diastolic (congestive) heart failure: Secondary | ICD-10-CM | POA: Diagnosis not present

## 2017-10-28 DIAGNOSIS — E119 Type 2 diabetes mellitus without complications: Secondary | ICD-10-CM | POA: Diagnosis not present

## 2017-10-28 DIAGNOSIS — N183 Chronic kidney disease, stage 3 (moderate): Secondary | ICD-10-CM | POA: Diagnosis not present

## 2017-10-28 DIAGNOSIS — E785 Hyperlipidemia, unspecified: Secondary | ICD-10-CM | POA: Diagnosis not present

## 2017-10-28 DIAGNOSIS — S0511XD Contusion of eyeball and orbital tissues, right eye, subsequent encounter: Secondary | ICD-10-CM | POA: Diagnosis not present

## 2017-10-30 DIAGNOSIS — R69 Illness, unspecified: Secondary | ICD-10-CM | POA: Diagnosis not present

## 2017-11-05 ENCOUNTER — Encounter (HOSPITAL_COMMUNITY): Payer: Self-pay | Admitting: Cardiology

## 2017-11-05 ENCOUNTER — Other Ambulatory Visit: Payer: Self-pay

## 2017-11-05 ENCOUNTER — Ambulatory Visit (HOSPITAL_COMMUNITY)
Admission: RE | Admit: 2017-11-05 | Discharge: 2017-11-05 | Disposition: A | Discharge status: Home / Self Care | Payer: Medicare HMO | Source: Ambulatory Visit | Attending: Cardiology | Admitting: Cardiology

## 2017-11-05 VITALS — BP 112/63 | HR 84 | Wt 245.0 lb

## 2017-11-05 DIAGNOSIS — I5032 Chronic diastolic (congestive) heart failure: Secondary | ICD-10-CM | POA: Insufficient documentation

## 2017-11-05 DIAGNOSIS — Z87891 Personal history of nicotine dependence: Secondary | ICD-10-CM | POA: Diagnosis not present

## 2017-11-05 DIAGNOSIS — Z7901 Long term (current) use of anticoagulants: Secondary | ICD-10-CM | POA: Insufficient documentation

## 2017-11-05 DIAGNOSIS — I480 Paroxysmal atrial fibrillation: Secondary | ICD-10-CM | POA: Diagnosis not present

## 2017-11-05 DIAGNOSIS — Z7984 Long term (current) use of oral hypoglycemic drugs: Secondary | ICD-10-CM | POA: Diagnosis not present

## 2017-11-05 DIAGNOSIS — Z809 Family history of malignant neoplasm, unspecified: Secondary | ICD-10-CM | POA: Diagnosis not present

## 2017-11-05 DIAGNOSIS — Z79899 Other long term (current) drug therapy: Secondary | ICD-10-CM | POA: Diagnosis not present

## 2017-11-05 DIAGNOSIS — I5033 Acute on chronic diastolic (congestive) heart failure: Secondary | ICD-10-CM | POA: Diagnosis not present

## 2017-11-05 DIAGNOSIS — Z8249 Family history of ischemic heart disease and other diseases of the circulatory system: Secondary | ICD-10-CM | POA: Diagnosis not present

## 2017-11-05 DIAGNOSIS — E1122 Type 2 diabetes mellitus with diabetic chronic kidney disease: Secondary | ICD-10-CM | POA: Insufficient documentation

## 2017-11-05 DIAGNOSIS — I130 Hypertensive heart and chronic kidney disease with heart failure and stage 1 through stage 4 chronic kidney disease, or unspecified chronic kidney disease: Secondary | ICD-10-CM | POA: Insufficient documentation

## 2017-11-05 DIAGNOSIS — I4891 Unspecified atrial fibrillation: Secondary | ICD-10-CM | POA: Insufficient documentation

## 2017-11-05 DIAGNOSIS — N183 Chronic kidney disease, stage 3 (moderate): Secondary | ICD-10-CM | POA: Diagnosis not present

## 2017-11-05 LAB — COMPREHENSIVE METABOLIC PANEL
ALK PHOS: 71 U/L (ref 38–126)
ALT: 22 U/L (ref 17–63)
AST: 28 U/L (ref 15–41)
Albumin: 3.8 g/dL (ref 3.5–5.0)
Anion gap: 10 (ref 5–15)
BUN: 10 mg/dL (ref 6–20)
CALCIUM: 9.3 mg/dL (ref 8.9–10.3)
CHLORIDE: 103 mmol/L (ref 101–111)
CO2: 24 mmol/L (ref 22–32)
CREATININE: 1.29 mg/dL — AB (ref 0.61–1.24)
GFR, EST AFRICAN AMERICAN: 55 mL/min — AB (ref >60)
GFR, EST NON AFRICAN AMERICAN: 47 mL/min — AB (ref >60)
Glucose, Bld: 93 mg/dL (ref 65–99)
Potassium: 3.7 mmol/L (ref 3.5–5.1)
Sodium: 137 mmol/L (ref 135–145)
Total Bilirubin: 1.4 mg/dL — ABNORMAL HIGH (ref 0.3–1.2)
Total Protein: 6.5 g/dL (ref 6.5–8.1)

## 2017-11-05 LAB — CBC
HCT: 39.4 % (ref 39.0–52.0)
Hemoglobin: 13.5 g/dL (ref 13.0–17.0)
MCH: 32.5 pg (ref 26.0–34.0)
MCHC: 34.3 g/dL (ref 30.0–36.0)
MCV: 94.9 fL (ref 78.0–100.0)
PLATELETS: 186 10*3/uL (ref 150–400)
RBC: 4.15 MIL/uL — AB (ref 4.22–5.81)
RDW: 14.3 % (ref 11.5–15.5)
WBC: 7.3 10*3/uL (ref 4.0–10.5)

## 2017-11-05 LAB — TSH: TSH: 3.362 u[IU]/mL (ref 0.350–4.500)

## 2017-11-05 NOTE — Progress Notes (Signed)
PCP: Dr. Maudie Mercury Cardiology: Dr. Aundra Dubin  84 y.o. with history of DM and HTN as well as atrial fibrillation and CHF presents for followup of CHF and atrial fibrillation.   Patient was doing well until 11/18 when he developed exertional dyspnea.  He went to the ER 07/14/17 and was found to be in atrial fibrillation with volume overload.  He was admitted overnight, diuresed and started on Eliquis, and discharged.  He remained in atrial fibrillation and short of breath.   He was re-hospitalized with dyspnea in 12/18.  TEE-guided DCCV to NSR was done and he was diuresed. TEE showed EF 50%.    He was noted to be back in atrial fibrillation and was started on amiodarone.  TEE-guided DCCV in 1/19 was done, with conversion back to NSR.   He was admitted in 2/19 with syncope and found to have tachy-brady syndrome with junctional bradycardia (symptomatic). Medtronic dual chamber PPM was placed.   He was readmitted in 2/19 with pneumonia.  During this admission, he was noted to be back in atrial fibrillation.   He returns today for followup of atrial fibrillation and diastolic HF.  Today, he is in NSR.  He reports that his legs feel weak after several hospitalizations and decreased exercise.  Cough has resolved.  No dyspnea but not as active as normal, using walker for balance.  No chest pain.    ECG (personally reviewed): a-paced, o/w normal  PMH: 1. Chronic diastolic CHF: Echo (73/22) with EF 50-55%, mild-moderate LVH, mild AI, mild MR, normal RV size and systolic function, PASP 45 mmHg.  - TEE (12/18): EF 50%, normal RV size and systolic function, mild MR.  - TEE (1/19): EF 55-60%, mild LVH, mild MR, normal RV, aortic sclerosis without significant stenosis.  2. Atrial fibrillation: Persistent, initially diagnosed in 11/18.  - TEE-guided DCCV in 12/18.  - TEE-guided DCCV in 1/19 on amiodarone.  3. Chest pain: LHC remotely without obstructive disease.  1/16 Cardiolite with no ischemia.  4. Type II  diabetes 5. HTN 6. CKD: Stage 3.  7. Tachy-brady syndrome: Medtronic dual chamber.    Social History   Socioeconomic History  . Marital status: Married    Spouse name: Not on file  . Number of children: Not on file  . Years of education: Not on file  . Highest education level: Not on file  Social Needs  . Financial resource strain: Not on file  . Food insecurity - worry: Not on file  . Food insecurity - inability: Not on file  . Transportation needs - medical: Not on file  . Transportation needs - non-medical: Not on file  Occupational History  . Not on file  Tobacco Use  . Smoking status: Former Smoker    Packs/day: 1.00    Types: Cigarettes    Last attempt to quit: 01/07/1992    Years since quitting: 25.8  . Smokeless tobacco: Never Used  Substance and Sexual Activity  . Alcohol use: Yes    Alcohol/week: 1.2 oz    Types: 2 Glasses of wine per week  . Drug use: No  . Sexual activity: Yes  Other Topics Concern  . Not on file  Social History Narrative  . Not on file   Family History  Problem Relation Age of Onset  . Cancer - Other Mother   . CVA Father   . Heart attack Brother   . Heart attack Maternal Grandmother    ROS: All systems reviewed and negative except as  per HPI.  Current Outpatient Medications  Medication Sig Dispense Refill  . amiodarone (PACERONE) 200 MG tablet Take 1 tablet (200 mg total) by mouth daily. 30 tablet 6  . apixaban (ELIQUIS) 5 MG TABS tablet Take 1 tablet (5 mg total) by mouth 2 (two) times daily. 60 tablet 3  . atorvastatin (LIPITOR) 20 MG tablet Take 1 tablet (20 mg total) by mouth daily at 6 PM. 30 tablet 0  . carvedilol (COREG) 3.125 MG tablet Take 1 tablet (3.125 mg total) by mouth 2 (two) times daily with a meal. 60 tablet 3  . Cholecalciferol (VITAMIN D) 2000 units CAPS Take 2,000 Units by mouth daily.     . furosemide (LASIX) 20 MG tablet Take 1 tablet (20 mg total) by mouth daily. 30 tablet 3  . gabapentin (NEURONTIN) 300 MG  capsule Take 300 mg by mouth daily.     . metFORMIN (GLUCOPHAGE-XR) 500 MG 24 hr tablet Take 500 mg by mouth daily with breakfast.    . Omega-3 Fatty Acids (FISH OIL) 1200 MG CAPS Take 1,200 mg by mouth 2 (two) times daily.     . vitamin B-12 (CYANOCOBALAMIN) 1000 MCG tablet Take 1,000 mcg by mouth daily.     No current facility-administered medications for this encounter.    BP 112/63   Pulse 84   Wt 245 lb (111.1 kg)   SpO2 99%   BMI 33.23 kg/m  General: NAD Neck: No JVD, no thyromegaly or thyroid nodule.  Lungs: Clear to auscultation bilaterally with normal respiratory effort. CV: Nondisplaced PMI.  Heart regular S1/S2, no S3/S4, no murmur.  No peripheral edema.  No carotid bruit.  Normal pedal pulses.  Abdomen: Soft, nontender, no hepatosplenomegaly, no distention.  Skin: Intact without lesions or rashes.  Neurologic: Alert and oriented x 3.  Psych: Normal affect. Extremities: No clubbing or cyanosis.  HEENT: Normal.   Assessment/Plan: 1. Atrial fibrillation: DCCV in 12/18.  Recurrent of atrial fibrillation with initiation of amiodarone then TEE-guided DCCV (missed an Eliquis dose) in 1/19.  Today, he is not in atrial fibrillation (a-paced). - Continue Coreg 3.125 mg bid.  - Continue amiodarone 200 mg daily. Will check CMET and TSH today, he will need regular eye exam.  - Continue apixaban, check CBC today.  2. CKD: Recent BMET improved with creatinine 0.96.   3. Chronic diastolic CHF: EF 93-26% from 1/19 TEE.  He does not look volume overloaded on exam, NYHA class II symptoms.   - Continue Lasix 20 mg daily.   Followup in 3 months.    Loralie Champagne 11/05/2017

## 2017-11-05 NOTE — Patient Instructions (Signed)
Labs today  Your physician recommends that you schedule a follow-up appointment in: 3 months  

## 2017-11-06 DIAGNOSIS — S0511XD Contusion of eyeball and orbital tissues, right eye, subsequent encounter: Secondary | ICD-10-CM | POA: Diagnosis not present

## 2017-11-06 DIAGNOSIS — E785 Hyperlipidemia, unspecified: Secondary | ICD-10-CM | POA: Diagnosis not present

## 2017-11-06 DIAGNOSIS — I130 Hypertensive heart and chronic kidney disease with heart failure and stage 1 through stage 4 chronic kidney disease, or unspecified chronic kidney disease: Secondary | ICD-10-CM | POA: Diagnosis not present

## 2017-11-06 DIAGNOSIS — I4891 Unspecified atrial fibrillation: Secondary | ICD-10-CM | POA: Diagnosis not present

## 2017-11-06 DIAGNOSIS — E119 Type 2 diabetes mellitus without complications: Secondary | ICD-10-CM | POA: Diagnosis not present

## 2017-11-06 DIAGNOSIS — I5043 Acute on chronic combined systolic (congestive) and diastolic (congestive) heart failure: Secondary | ICD-10-CM | POA: Diagnosis not present

## 2017-11-06 DIAGNOSIS — N183 Chronic kidney disease, stage 3 (moderate): Secondary | ICD-10-CM | POA: Diagnosis not present

## 2017-11-06 DIAGNOSIS — Z87891 Personal history of nicotine dependence: Secondary | ICD-10-CM | POA: Diagnosis not present

## 2017-11-06 DIAGNOSIS — J189 Pneumonia, unspecified organism: Secondary | ICD-10-CM | POA: Diagnosis not present

## 2017-11-06 DIAGNOSIS — R911 Solitary pulmonary nodule: Secondary | ICD-10-CM | POA: Diagnosis not present

## 2017-11-15 DIAGNOSIS — S0511XD Contusion of eyeball and orbital tissues, right eye, subsequent encounter: Secondary | ICD-10-CM | POA: Diagnosis not present

## 2017-11-15 DIAGNOSIS — N183 Chronic kidney disease, stage 3 (moderate): Secondary | ICD-10-CM | POA: Diagnosis not present

## 2017-11-15 DIAGNOSIS — I130 Hypertensive heart and chronic kidney disease with heart failure and stage 1 through stage 4 chronic kidney disease, or unspecified chronic kidney disease: Secondary | ICD-10-CM | POA: Diagnosis not present

## 2017-11-15 DIAGNOSIS — E119 Type 2 diabetes mellitus without complications: Secondary | ICD-10-CM | POA: Diagnosis not present

## 2017-11-15 DIAGNOSIS — J189 Pneumonia, unspecified organism: Secondary | ICD-10-CM | POA: Diagnosis not present

## 2017-11-15 DIAGNOSIS — I4891 Unspecified atrial fibrillation: Secondary | ICD-10-CM | POA: Diagnosis not present

## 2017-11-15 DIAGNOSIS — R911 Solitary pulmonary nodule: Secondary | ICD-10-CM | POA: Diagnosis not present

## 2017-11-15 DIAGNOSIS — I5043 Acute on chronic combined systolic (congestive) and diastolic (congestive) heart failure: Secondary | ICD-10-CM | POA: Diagnosis not present

## 2017-11-15 DIAGNOSIS — E785 Hyperlipidemia, unspecified: Secondary | ICD-10-CM | POA: Diagnosis not present

## 2017-11-15 DIAGNOSIS — Z87891 Personal history of nicotine dependence: Secondary | ICD-10-CM | POA: Diagnosis not present

## 2017-11-19 DIAGNOSIS — E119 Type 2 diabetes mellitus without complications: Secondary | ICD-10-CM | POA: Diagnosis not present

## 2017-11-19 DIAGNOSIS — Z961 Presence of intraocular lens: Secondary | ICD-10-CM | POA: Diagnosis not present

## 2017-11-19 DIAGNOSIS — H43812 Vitreous degeneration, left eye: Secondary | ICD-10-CM | POA: Diagnosis not present

## 2017-12-10 DIAGNOSIS — R69 Illness, unspecified: Secondary | ICD-10-CM | POA: Diagnosis not present

## 2017-12-12 DIAGNOSIS — E1151 Type 2 diabetes mellitus with diabetic peripheral angiopathy without gangrene: Secondary | ICD-10-CM | POA: Diagnosis not present

## 2017-12-12 DIAGNOSIS — R82998 Other abnormal findings in urine: Secondary | ICD-10-CM | POA: Diagnosis not present

## 2017-12-12 DIAGNOSIS — E7800 Pure hypercholesterolemia, unspecified: Secondary | ICD-10-CM | POA: Diagnosis not present

## 2017-12-12 DIAGNOSIS — Z125 Encounter for screening for malignant neoplasm of prostate: Secondary | ICD-10-CM | POA: Diagnosis not present

## 2017-12-16 ENCOUNTER — Encounter (HOSPITAL_COMMUNITY): Payer: Medicare HMO | Admitting: Cardiology

## 2017-12-19 DIAGNOSIS — Z6835 Body mass index (BMI) 35.0-35.9, adult: Secondary | ICD-10-CM | POA: Diagnosis not present

## 2017-12-19 DIAGNOSIS — Z0000 Encounter for general adult medical examination without abnormal findings: Secondary | ICD-10-CM | POA: Diagnosis not present

## 2017-12-19 DIAGNOSIS — I10 Essential (primary) hypertension: Secondary | ICD-10-CM | POA: Diagnosis not present

## 2017-12-19 DIAGNOSIS — Z1389 Encounter for screening for other disorder: Secondary | ICD-10-CM | POA: Diagnosis not present

## 2017-12-19 DIAGNOSIS — E1151 Type 2 diabetes mellitus with diabetic peripheral angiopathy without gangrene: Secondary | ICD-10-CM | POA: Diagnosis not present

## 2017-12-19 DIAGNOSIS — E7800 Pure hypercholesterolemia, unspecified: Secondary | ICD-10-CM | POA: Diagnosis not present

## 2017-12-19 DIAGNOSIS — I480 Paroxysmal atrial fibrillation: Secondary | ICD-10-CM | POA: Diagnosis not present

## 2017-12-19 DIAGNOSIS — Z7901 Long term (current) use of anticoagulants: Secondary | ICD-10-CM | POA: Diagnosis not present

## 2017-12-19 DIAGNOSIS — M160 Bilateral primary osteoarthritis of hip: Secondary | ICD-10-CM | POA: Diagnosis not present

## 2017-12-20 DIAGNOSIS — Z1212 Encounter for screening for malignant neoplasm of rectum: Secondary | ICD-10-CM | POA: Diagnosis not present

## 2018-01-02 ENCOUNTER — Encounter: Payer: Self-pay | Admitting: Internal Medicine

## 2018-01-02 ENCOUNTER — Ambulatory Visit: Payer: Medicare HMO | Admitting: Internal Medicine

## 2018-01-02 VITALS — BP 126/90 | HR 80 | Ht 72.0 in | Wt 254.0 lb

## 2018-01-02 DIAGNOSIS — I5033 Acute on chronic diastolic (congestive) heart failure: Secondary | ICD-10-CM | POA: Diagnosis not present

## 2018-01-02 DIAGNOSIS — Z950 Presence of cardiac pacemaker: Secondary | ICD-10-CM | POA: Diagnosis not present

## 2018-01-02 DIAGNOSIS — I4819 Other persistent atrial fibrillation: Secondary | ICD-10-CM

## 2018-01-02 DIAGNOSIS — I481 Persistent atrial fibrillation: Secondary | ICD-10-CM

## 2018-01-02 DIAGNOSIS — I5032 Chronic diastolic (congestive) heart failure: Secondary | ICD-10-CM

## 2018-01-02 NOTE — Patient Instructions (Signed)
Medication Instructions:  Your physician recommends that you continue on your current medications as directed. Please refer to the Current Medication list given to you today.  Labwork: None ordered.  Testing/Procedures: None ordered.  Follow-Up: Your physician wants you to follow-up in: 9 months with Chanetta Marshall, NP. You will receive a reminder letter in the mail two months in advance. If you don't receive a letter, please call our office to schedule the follow-up appointment.  Remote monitoring is used to monitor your Pacemaker of ICD from home. This monitoring reduces the number of office visits required to check your device to one time per year. It allows [place] to keep an eye on the functioning of your device to ensure it is working properly. You are scheduled for a device check from home on 04/03/2018. You may send your transmission at any time that day. If you have a wireless device, the transmission will be sent automatically. After your physician reviews your transmission, you will receive a postcard with your next transmission date.    Any Other Special Instructions Will Be Listed Below (If Applicable).     If you need a refill on your cardiac medications before your next appointment, please call your pharmacy.

## 2018-01-02 NOTE — Progress Notes (Signed)
Patient Care Team: Tisovec, Fransico Him, MD as PCP - General (Internal Medicine) Larey Dresser, MD as PCP - Cardiology (Cardiology)   HPI  Dylan [name] is a 84 y.o. male Seen in follow-up for pacemaker implantation for bradycardia and presyncope.  He was found to have significant orthostatic hypotension; despite discontinuation of beta-blockers he had recurrent profound bradycardia and underwent pacing 2/19.  He is much improved.  Denies chest pain or shortness of breath.  Lightheadedness is also improved.  He is ambulation is largely limited by his back and knees.  DATE TEST EF   1/19 TEE  55-60 %         Date Cr K Hgb TSH LFTs  3/19 1.29 3.7 13.5 3.36 22     Past Medical History:  Diagnosis Date  . Hyperglycemia   . Hyperlipidemia   . Hypertension   . Lung nodule     Past Surgical History:  Procedure Laterality Date  . APPENDECTOMY  1956  . BACK SURGERY  2008  . CARDIAC CATHETERIZATION  1995   negative results  . CARDIOVERSION N/A 08/05/2017   Procedure: CARDIOVERSION;  Surgeon: Larey Dresser, MD;  Location: [facility] ENDOSCOPY;  Service: Cardiovascular;  Laterality: N/A;  . CARDIOVERSION N/A 09/12/2017   Procedure: CARDIOVERSION;  Surgeon: Larey Dresser, MD;  Location: [facility] ENDOSCOPY;  Service: Cardiovascular;  Laterality: N/A;  . COLON SURGERY  07/09/12  . [place]  . HOT HEMOSTASIS  01/08/2012   Procedure: HOT HEMOSTASIS (ARGON PLASMA COAGULATION/BICAP);  Surgeon: Arta Silence, MD;  Location: [facility] ENDOSCOPY;  Service: Endoscopy;  Laterality: N/A;  . LAPAROSCOPIC ILEOCECECTOMY  07/09/2012   Procedure: LAPAROSCOPIC ILEOCECECTOMY;  Surgeon: Edward Jolly, MD;  Location: [facility] ORS;  Service: General;  Laterality: N/A;  Laparoscopic Ileocecectomy  . PACEMAKER IMPLANT N/A 10/05/2017   Procedure: PACEMAKER IMPLANT;  Surgeon: Deboraha Sprang, MD;  Location: [place] [facility];  Service: Cardiovascular;  Laterality: N/A;  . REPLACEMENT TOTAL KNEE  2010   . TEE WITHOUT CARDIOVERSION N/A 08/05/2017   Procedure: TRANSESOPHAGEAL ECHOCARDIOGRAM (TEE);  Surgeon: Larey Dresser, MD;  Location: [facility] ENDOSCOPY;  Service: Cardiovascular;  Laterality: N/A;  . TEE WITHOUT CARDIOVERSION N/A 09/12/2017   Procedure: TRANSESOPHAGEAL ECHOCARDIOGRAM (TEE);  Surgeon: Larey Dresser, MD;  Location: [facility] ENDOSCOPY;  Service: Cardiovascular;  Laterality: N/A;    Current Meds  Medication Sig  . amiodarone (PACERONE) 200 MG tablet Take 1 tablet (200 mg total) by mouth daily.  Marland Kitchen apixaban (ELIQUIS) 5 MG TABS tablet Take 1 tablet (5 mg total) by mouth 2 (two) times daily.  Marland Kitchen atorvastatin (LIPITOR) 20 MG tablet Take 1 tablet (20 mg total) by mouth daily at 6 PM.  . carvedilol (COREG) 3.125 MG tablet Take 1 tablet (3.125 mg total) by mouth 2 (two) times daily with a meal.  . Cholecalciferol (VITAMIN D) 2000 units CAPS Take 2,000 Units by mouth daily.   . furosemide (LASIX) 20 MG tablet Take 1 tablet (20 mg total) by mouth daily.  . metFORMIN (GLUCOPHAGE-XR) 500 MG 24 hr tablet Take 500 mg by mouth daily with breakfast.  . Omega-3 Fatty Acids (FISH OIL) 1200 MG CAPS Take 1,200 mg by mouth 2 (two) times daily.   . vitamin B-12 (CYANOCOBALAMIN) 1000 MCG tablet Take 1,000 mcg by mouth daily.    Allergies  Allergen Reactions  . Keflex [Cephalexin] Hives and Other (See Comments)    Occurred in the 1980's  Review of Systems negative except from HPI and PMH  Physical Exam BP 126/90   Pulse 80   Ht 6' (1.829 m)   Wt 254 lb (115.2 kg)   SpO2 93%   BMI 34.45 kg/m  Well developed and nourished in no acute distress HENT normal Neck supple with JVP-flat Clear Device pocket well healed; without hematoma or erythema.  There is no tethering  Regular rate and rhythm, no murmurs or gallops Abd-soft with active BS No Clubbing cyanosis edema Skin-warm and dry A & Oriented  Grossly normal sensory and motor function walks wi walker  ECG personally reviewed Apacing  78 32/08/42   Assessment and  Plan Sinus node dysfunction  Orthostatic intolerance  Pacemaker-Medtronic The patient's device was interrogated and the information was fully reviewed.  The device was reprogrammed to maximize longevity  First-degree AV block  Atrial fibrillation  Hypertension  High risk medication surveillance   Orthostatic symptoms largely quiescent.  Exercise tolerance improved following pacing.  On Anticoagulation;  No bleeding issues   Chronotropic competence adequate  BP reasonably controlled  Amio labs ok  Would consider decreasing amio at next visit depending on Afib burden, most recently, afib was assoc with RVR  We spent more than 50% of our >25 min visit in face to face counseling regarding the above          Current medicines are reviewed at length with the patient today .  The patient does not  have concerns regarding medicines.

## 2018-02-06 ENCOUNTER — Ambulatory Visit (HOSPITAL_COMMUNITY)
Admission: RE | Admit: 2018-02-06 | Discharge: 2018-02-06 | Disposition: A | Discharge status: Home / Self Care | Payer: Medicare HMO | Source: Ambulatory Visit | Attending: Cardiology | Admitting: Cardiology

## 2018-02-06 VITALS — BP 120/76 | HR 79 | Wt 254.1 lb

## 2018-02-06 DIAGNOSIS — I4891 Unspecified atrial fibrillation: Secondary | ICD-10-CM | POA: Insufficient documentation

## 2018-02-06 DIAGNOSIS — I5033 Acute on chronic diastolic (congestive) heart failure: Secondary | ICD-10-CM | POA: Diagnosis not present

## 2018-02-06 DIAGNOSIS — E1122 Type 2 diabetes mellitus with diabetic chronic kidney disease: Secondary | ICD-10-CM | POA: Insufficient documentation

## 2018-02-06 DIAGNOSIS — I130 Hypertensive heart and chronic kidney disease with heart failure and stage 1 through stage 4 chronic kidney disease, or unspecified chronic kidney disease: Secondary | ICD-10-CM | POA: Diagnosis not present

## 2018-02-06 DIAGNOSIS — R5383 Other fatigue: Secondary | ICD-10-CM | POA: Diagnosis not present

## 2018-02-06 DIAGNOSIS — R001 Bradycardia, unspecified: Secondary | ICD-10-CM

## 2018-02-06 DIAGNOSIS — R9431 Abnormal electrocardiogram [ECG] [EKG]: Secondary | ICD-10-CM | POA: Diagnosis not present

## 2018-02-06 DIAGNOSIS — N183 Chronic kidney disease, stage 3 (moderate): Secondary | ICD-10-CM | POA: Insufficient documentation

## 2018-02-06 DIAGNOSIS — I480 Paroxysmal atrial fibrillation: Secondary | ICD-10-CM | POA: Diagnosis not present

## 2018-02-06 DIAGNOSIS — I5032 Chronic diastolic (congestive) heart failure: Secondary | ICD-10-CM | POA: Insufficient documentation

## 2018-02-06 DIAGNOSIS — R29898 Other symptoms and signs involving the musculoskeletal system: Secondary | ICD-10-CM

## 2018-02-06 LAB — COMPREHENSIVE METABOLIC PANEL
ALT: 18 U/L (ref 17–63)
AST: 21 U/L (ref 15–41)
Albumin: 3.9 g/dL (ref 3.5–5.0)
Alkaline Phosphatase: 71 U/L (ref 38–126)
Anion gap: 9 (ref 5–15)
BUN: 12 mg/dL (ref 6–20)
CHLORIDE: 106 mmol/L (ref 101–111)
CO2: 25 mmol/L (ref 22–32)
Calcium: 9.2 mg/dL (ref 8.9–10.3)
Creatinine, Ser: 1.18 mg/dL (ref 0.61–1.24)
GFR calc Af Amer: >60 mL/min (ref >60)
GFR, EST NON AFRICAN AMERICAN: 53 mL/min — AB (ref >60)
Glucose, Bld: 82 mg/dL (ref 65–99)
Potassium: 4.8 mmol/L (ref 3.5–5.1)
SODIUM: 140 mmol/L (ref 135–145)
Total Bilirubin: 1.7 mg/dL — ABNORMAL HIGH (ref 0.3–1.2)
Total Protein: 6.4 g/dL — ABNORMAL LOW (ref 6.5–8.1)

## 2018-02-06 LAB — TSH: TSH: 2.102 u[IU]/mL (ref 0.350–4.500)

## 2018-02-06 NOTE — Patient Instructions (Signed)
Labs today  Your physician has requested that you have an ankle brachial index (ABI). During this test an ultrasound and blood pressure cuff are used to evaluate the arteries that supply the arms and legs with blood. Allow thirty minutes for this exam. There are no restrictions or special instructions.  Please wear your compression hose daily, place them on as soon as you get up in the morning and remove before you go to bed at night.  We will contact you in 6 months to schedule your next appointment.

## 2018-02-06 NOTE — Progress Notes (Addendum)
PCP: Dr. Osborne Casco Cardiology: Dr. Aundra Dubin  84 y.o. with history of DM and HTN as well as atrial fibrillation and CHF presents for followup of CHF and atrial fibrillation.   Patient was doing well until 11/18 when he developed exertional dyspnea.  He went to the ER 07/14/17 and was found to be in atrial fibrillation with volume overload.  He was admitted overnight, diuresed and started on Eliquis, and discharged.  He remained in atrial fibrillation and short of breath.   He was re-hospitalized with dyspnea in 12/18.  TEE-guided DCCV to NSR was done and he was diuresed. TEE showed EF 50%.    He was noted to be back in atrial fibrillation and was started on amiodarone.  TEE-guided DCCV in 1/19 was done, with conversion back to NSR.   He was admitted in 2/19 with syncope and found to have tachy-brady syndrome with junctional bradycardia (symptomatic). Medtronic dual chamber PPM was placed.   He was readmitted in 2/19 with pneumonia.  During this admission, he was noted to be back in atrial fibrillation.   He returns today for followup of atrial fibrillation and diastolic HF.  Today, he is in NSR (a-paced).  Weight is up about 9 lbs; he says that he has not been as active recently.  He is using a walker for balance.  No dyspnea walking on flat ground.  Legs fatigue easily, however.  No chest pain.  No lightheadedness.    ECG (personally reviewed): a-paced at 79 bpm  PMH: 1. Chronic diastolic CHF: Echo (25/05) with EF 50-55%, mild-moderate LVH, mild AI, mild MR, normal RV size and systolic function, PASP 45 mmHg.  - TEE (12/18): EF 50%, normal RV size and systolic function, mild MR.  - TEE (1/19): EF 55-60%, mild LVH, mild MR, normal RV, aortic sclerosis without significant stenosis.  2. Atrial fibrillation: Persistent, initially diagnosed in 11/18.  - TEE-guided DCCV in 12/18.  - TEE-guided DCCV in 1/19 on amiodarone.  3. Chest pain: LHC remotely without obstructive disease.  1/16 Cardiolite with  no ischemia.  4. Type II diabetes 5. HTN 6. CKD: Stage 3.  7. Tachy-brady syndrome: Medtronic dual chamber.    Social History   Socioeconomic History  . Marital status: Married    Spouse name: Not on file  . Number of children: Not on file  . Years of education: Not on file  . Highest education level: Not on file  Occupational History  . Not on file  Social Needs  . Financial resource strain: Not on file  . Food insecurity:    Worry: Not on file    Inability: Not on file  . Transportation needs:    Medical: Not on file    Non-medical: Not on file  Tobacco Use  . Smoking status: Former Smoker    Packs/day: 1.00    Types: Cigarettes    Last attempt to quit: 01/07/1992    Years since quitting: 26.1  . Smokeless tobacco: Never Used  Substance and Sexual Activity  . Alcohol use: Yes    Alcohol/week: 1.2 oz    Types: 2 Glasses of wine per week  . Drug use: No  . Sexual activity: Yes  Lifestyle  . Physical activity:    Days per week: Not on file    Minutes per session: Not on file  . Stress: Not on file  Relationships  . Social connections:    Talks on phone: Not on file    Gets together: Not on  file    Attends religious service: Not on file    Active member of club or organization: Not on file    Attends meetings of clubs or organizations: Not on file    Relationship status: Not on file  . Intimate partner violence:    Fear of current or ex partner: Not on file    Emotionally abused: Not on file    Physically abused: Not on file    Forced sexual activity: Not on file  Other Topics Concern  . Not on file  Social History Narrative  . Not on file   Family History  Problem Relation Age of Onset  . Cancer - Other Mother   . CVA Father   . Heart attack Brother   . Heart attack Maternal Grandmother    ROS: All systems reviewed and negative except as per HPI.  Current Outpatient Medications  Medication Sig Dispense Refill  . amiodarone (PACERONE) 200 MG tablet  Take 1 tablet (200 mg total) by mouth daily. 30 tablet 6  . apixaban (ELIQUIS) 5 MG TABS tablet Take 1 tablet (5 mg total) by mouth 2 (two) times daily. 60 tablet 3  . atorvastatin (LIPITOR) 20 MG tablet Take 1 tablet (20 mg total) by mouth daily at 6 PM. 30 tablet 0  . carvedilol (COREG) 3.125 MG tablet Take 1 tablet (3.125 mg total) by mouth 2 (two) times daily with a meal. 60 tablet 3  . Cholecalciferol (VITAMIN D) 2000 units CAPS Take 2,000 Units by mouth daily.     . furosemide (LASIX) 20 MG tablet Take 1 tablet (20 mg total) by mouth daily. 30 tablet 3  . Omega-3 Fatty Acids (FISH OIL) 1200 MG CAPS Take 1,200 mg by mouth 2 (two) times daily.     . vitamin B-12 (CYANOCOBALAMIN) 1000 MCG tablet Take 1,000 mcg by mouth daily.    . metFORMIN (GLUCOPHAGE-XR) 500 MG 24 hr tablet Take 500 mg by mouth daily with breakfast.     No current facility-administered medications for this encounter.    BP 120/76   Pulse 79   Wt 254 lb 1.9 oz (115.3 kg)   SpO2 94%   BMI 34.46 kg/m  General: NAD Neck: No JVD, no thyromegaly or thyroid nodule.  Lungs: Clear to auscultation bilaterally with normal respiratory effort. CV: Nondisplaced PMI.  Heart regular S1/S2, no S3/S4, 1/6 SEM RUSB.  2+ ankle edema.  No carotid bruit.  Unable to palpate pedal pulses.  Abdomen: Soft, nontender, no hepatosplenomegaly, no distention.  Skin: Intact without lesions or rashes.  Neurologic: Alert and oriented x 3.  Psych: Normal affect. Extremities: No clubbing or cyanosis.  HEENT: Normal.   Assessment/Plan: 1. Atrial fibrillation: DCCV in 12/18.  Recurrence of atrial fibrillation with initiation of amiodarone then TEE-guided DCCV (missed an Eliquis dose) in 1/19.  Today, he is not in atrial fibrillation (a-paced). - Continue Coreg 3.125 mg bid.  - Continue amiodarone 200 mg daily, probably decrease to 100 mg daily next appt. Will check CMET and TSH today, he will need regular eye exam.  - Continue apixaban.  2. CKD:  BMET today.    3. Chronic diastolic CHF: EF 60-45% from 1/19 TEE.  He does not look volume overloaded on exam, NYHA class II symptoms.   - Continue Lasix 20 mg daily. - I will have him use compression stockings for peripheral edema, suspect venous insufficiency.   4. Leg fatigue: Difficult to palpate pedal pulses.  I will arrange for ABIs.  Followup in 6 months.    Loralie Champagne 02/06/2018

## 2018-02-14 ENCOUNTER — Ambulatory Visit (HOSPITAL_COMMUNITY)
Admission: RE | Admit: 2018-02-14 | Discharge: 2018-02-14 | Disposition: A | Discharge status: Home / Self Care | Payer: Medicare HMO | Source: Ambulatory Visit | Attending: Cardiovascular Disease | Admitting: Cardiovascular Disease

## 2018-02-14 DIAGNOSIS — R29898 Other symptoms and signs involving the musculoskeletal system: Secondary | ICD-10-CM | POA: Diagnosis not present

## 2018-03-28 IMAGING — DX DG CHEST 1V PORT
1 series · 1 of 1 positions shown · non-contrast
Comparison: Chest x-ray dated August 03, 2017.

CLINICAL DATA: Rule out pneumothorax.

EXAM:
PORTABLE CHEST 1 VIEW

[chest ap]
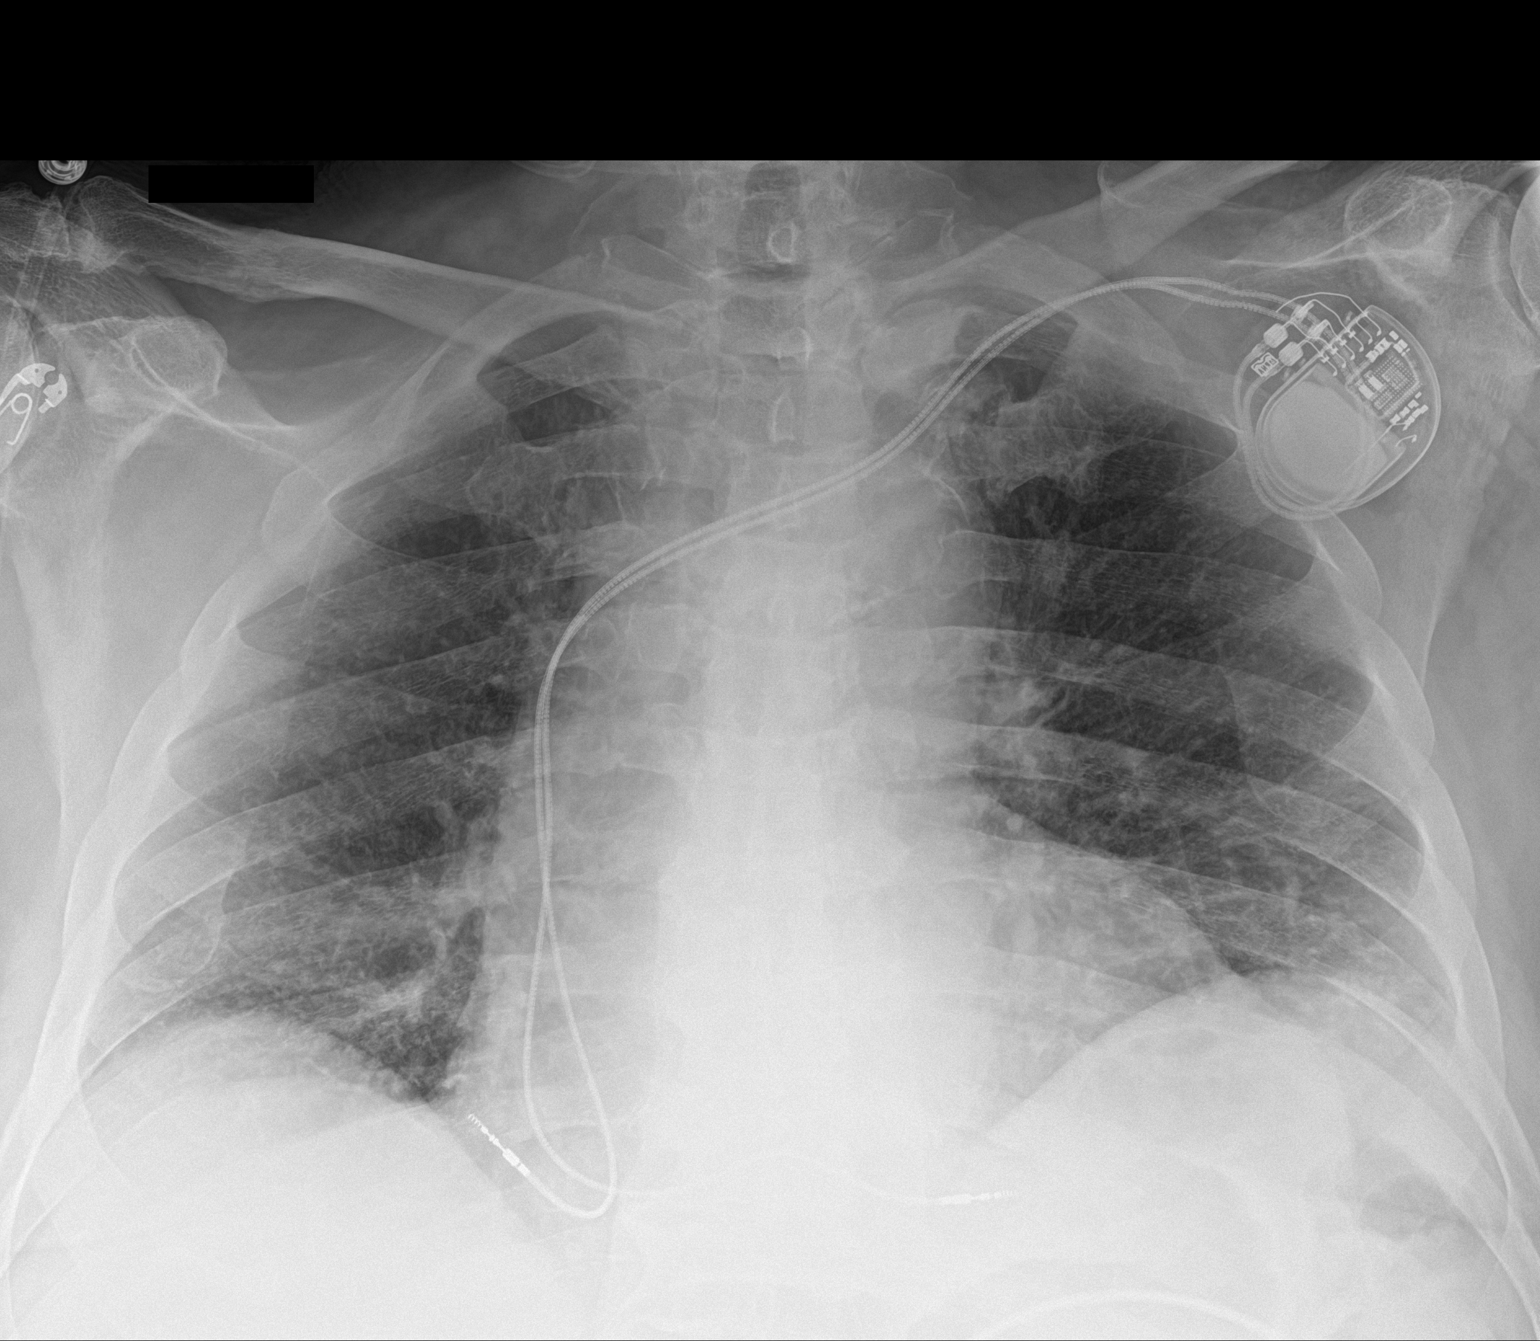

[1 of 1 positions shown; findings below may reference images not displayed]

FINDINGS: Interval placement of a left chest wall AICD with leads terminating
in the right atrium and right ventricle. Stable cardiomegaly. Normal
pulmonary vascularity.. Low lung volumes with bibasilar atelectasis.
No focal consolidation, pleural effusion, or pneumothorax. No acute
osseous abnormality.
IMPRESSION: 1. Interval placement of a left chest wall AICD.  No pneumothorax.
2. Low lung volumes with bibasilar atelectasis.

## 2018-04-03 ENCOUNTER — Ambulatory Visit (INDEPENDENT_AMBULATORY_CARE_PROVIDER_SITE_OTHER): Payer: Medicare HMO | Admitting: *Deleted

## 2018-04-03 DIAGNOSIS — I442 Atrioventricular block, complete: Secondary | ICD-10-CM | POA: Diagnosis not present

## 2018-04-03 NOTE — Progress Notes (Signed)
Remote pacemaker transmission.   

## 2018-05-12 LAB — CUP PACEART REMOTE DEVICE CHECK
Battery Voltage: 3.11 V
Brady Statistic AP VS Percent: 98.99 %
Brady Statistic RA Percent Paced: 99.71 %
Date Time Interrogation Session: 20190815013412
Implantable Lead Location: 753860
Implantable Lead Model: 5076
Implantable Lead Model: 5076
Implantable Pulse Generator Implant Date: 20190216
Lead Channel Impedance Value: 323 Ohm
Lead Channel Impedance Value: 380 Ohm
Lead Channel Pacing Threshold Amplitude: 0.75 V
Lead Channel Pacing Threshold Pulse Width: 0.4 ms
Lead Channel Pacing Threshold Pulse Width: 0.4 ms
Lead Channel Sensing Intrinsic Amplitude: 2.875 mV
Lead Channel Setting Pacing Amplitude: 2.5 V
Lead Channel Setting Sensing Sensitivity: 1.2 mV
MDC IDC LEAD IMPLANT DT: 20190216
MDC IDC LEAD IMPLANT DT: 20190216
MDC IDC LEAD LOCATION: 753859
MDC IDC MSMT BATTERY REMAINING LONGEVITY: 140 mo
MDC IDC MSMT LEADCHNL RA IMPEDANCE VALUE: 494 Ohm
MDC IDC MSMT LEADCHNL RA SENSING INTR AMPL: 2.875 mV
MDC IDC MSMT LEADCHNL RV IMPEDANCE VALUE: 456 Ohm
MDC IDC MSMT LEADCHNL RV PACING THRESHOLD AMPLITUDE: 1 V
MDC IDC MSMT LEADCHNL RV SENSING INTR AMPL: 5.75 mV
MDC IDC MSMT LEADCHNL RV SENSING INTR AMPL: 5.75 mV
MDC IDC SET LEADCHNL RV PACING AMPLITUDE: 2.5 V
MDC IDC SET LEADCHNL RV PACING PULSEWIDTH: 0.4 ms
MDC IDC STAT BRADY AP VP PERCENT: 0.7 %
MDC IDC STAT BRADY AS VP PERCENT: 0 %
MDC IDC STAT BRADY AS VS PERCENT: 0.31 %
MDC IDC STAT BRADY RV PERCENT PACED: 0.7 %

## 2018-05-19 DIAGNOSIS — R69 Illness, unspecified: Secondary | ICD-10-CM | POA: Diagnosis not present

## 2018-05-26 DIAGNOSIS — R69 Illness, unspecified: Secondary | ICD-10-CM | POA: Diagnosis not present

## 2018-06-02 DIAGNOSIS — R69 Illness, unspecified: Secondary | ICD-10-CM | POA: Diagnosis not present

## 2018-06-09 ENCOUNTER — Other Ambulatory Visit (HOSPITAL_COMMUNITY): Payer: Self-pay | Admitting: Cardiology

## 2018-06-10 DIAGNOSIS — I4892 Unspecified atrial flutter: Secondary | ICD-10-CM | POA: Diagnosis not present

## 2018-06-10 DIAGNOSIS — Z6835 Body mass index (BMI) 35.0-35.9, adult: Secondary | ICD-10-CM | POA: Diagnosis not present

## 2018-06-10 DIAGNOSIS — I110 Hypertensive heart disease with heart failure: Secondary | ICD-10-CM | POA: Diagnosis not present

## 2018-06-10 DIAGNOSIS — I509 Heart failure, unspecified: Secondary | ICD-10-CM | POA: Diagnosis not present

## 2018-06-10 DIAGNOSIS — E119 Type 2 diabetes mellitus without complications: Secondary | ICD-10-CM | POA: Diagnosis not present

## 2018-06-10 DIAGNOSIS — E785 Hyperlipidemia, unspecified: Secondary | ICD-10-CM | POA: Diagnosis not present

## 2018-06-10 DIAGNOSIS — I4891 Unspecified atrial fibrillation: Secondary | ICD-10-CM | POA: Diagnosis not present

## 2018-06-10 DIAGNOSIS — Z7722 Contact with and (suspected) exposure to environmental tobacco smoke (acute) (chronic): Secondary | ICD-10-CM | POA: Diagnosis not present

## 2018-06-10 DIAGNOSIS — Z7901 Long term (current) use of anticoagulants: Secondary | ICD-10-CM | POA: Diagnosis not present

## 2018-06-10 DIAGNOSIS — E669 Obesity, unspecified: Secondary | ICD-10-CM | POA: Diagnosis not present

## 2018-06-26 ENCOUNTER — Other Ambulatory Visit: Payer: Self-pay

## 2018-06-26 ENCOUNTER — Encounter (HOSPITAL_COMMUNITY): Payer: Self-pay | Admitting: Emergency Medicine

## 2018-06-26 ENCOUNTER — Ambulatory Visit (HOSPITAL_COMMUNITY)
Admission: EM | Admit: 2018-06-26 | Discharge: 2018-06-26 | Disposition: A | Discharge status: Home / Self Care | Payer: Medicare HMO | Attending: Family Medicine | Admitting: Family Medicine

## 2018-06-26 DIAGNOSIS — S91209A Unspecified open wound of unspecified toe(s) with damage to nail, initial encounter: Secondary | ICD-10-CM | POA: Diagnosis not present

## 2018-06-26 NOTE — Discharge Instructions (Addendum)
Avoid vaseline products on the glue  The glue will start flaking off in 5 to 7 days.  He do not have to do anything to help the glue come off.  You will get a new nail in the next few months.  Keep the dressing on until tomorrow.

## 2018-06-26 NOTE — ED Provider Notes (Signed)
[place]    CSN: [id number] Arrival date & time: 06/26/18  0850     History   Chief Complaint Chief Complaint  Patient presents with  . Nail Problem    right great toe    HPI Dylan [name] is a 84 y.o. male.   Established patient  Pt reports clipping his toenails this morning when his right great toe nail came off.  He had excessive bleeding and they were concerned so they came here to be evaluated.  Pt takes Eliquis.  Pt has bled through gauze, paper tape and two socks.  I applied a pressure dressing to the toe until a provider can assess him.     Past Medical History:  Diagnosis Date  . Hyperglycemia   . Hyperlipidemia   . Hypertension   . Lung nodule     Patient Active Problem List   Diagnosis Date Noted  . Periorbital hematoma of left eye 10/15/2017  . Fall 10/15/2017  . HCAP (healthcare-associated pneumonia) 10/14/2017  . Bradycardia 10/03/2017  . Acute CHF (congestive heart failure) ([place]) 08/03/2017  . Atrial fibrillation ([place]) 07/14/2017  . Diastolic dysfunction with acute on chronic heart failure ([place]) 07/14/2017  . DOE (dyspnea on exertion) 08/24/2014  . Benign essential HTN 08/24/2014  . DM II (diabetes mellitus, type II), controlled ([place]) 08/24/2014  . Dyslipidemia 08/24/2014    Past Surgical History:  Procedure Laterality Date  . APPENDECTOMY  1956  . BACK SURGERY  2008  . CARDIAC CATHETERIZATION  1995   negative results  . CARDIOVERSION N/A 08/05/2017   Procedure: CARDIOVERSION;  Surgeon: Larey Dresser, MD;  Location: [facility] ENDOSCOPY;  Service: Cardiovascular;  Laterality: N/A;  . CARDIOVERSION N/A 09/12/2017   Procedure: CARDIOVERSION;  Surgeon: Larey Dresser, MD;  Location: [facility] ENDOSCOPY;  Service: Cardiovascular;  Laterality: N/A;  . COLON SURGERY  07/09/12  . [place]  . HOT HEMOSTASIS  01/08/2012   Procedure: HOT HEMOSTASIS (ARGON PLASMA COAGULATION/BICAP);  Surgeon: Arta Silence, MD;  Location: [facility]  ENDOSCOPY;  Service: Endoscopy;  Laterality: N/A;  . LAPAROSCOPIC ILEOCECECTOMY  07/09/2012   Procedure: LAPAROSCOPIC ILEOCECECTOMY;  Surgeon: Edward Jolly, MD;  Location: [facility] ORS;  Service: General;  Laterality: N/A;  Laparoscopic Ileocecectomy  . PACEMAKER IMPLANT N/A 10/05/2017   Procedure: PACEMAKER IMPLANT;  Surgeon: Deboraha Sprang, MD;  Location: [place] CV LAB;  Service: Cardiovascular;  Laterality: N/A;  . REPLACEMENT TOTAL KNEE  2010  . TEE WITHOUT CARDIOVERSION N/A 08/05/2017   Procedure: TRANSESOPHAGEAL ECHOCARDIOGRAM (TEE);  Surgeon: Larey Dresser, MD;  Location: [facility] ENDOSCOPY;  Service: Cardiovascular;  Laterality: N/A;  . TEE WITHOUT CARDIOVERSION N/A 09/12/2017   Procedure: TRANSESOPHAGEAL ECHOCARDIOGRAM (TEE);  Surgeon: Larey Dresser, MD;  Location: [facility] ENDOSCOPY;  Service: Cardiovascular;  Laterality: N/A;       Home Medications    Prior to Admission medications   Medication Sig Start Date End Date Taking? Authorizing Provider  amiodarone (PACERONE) 200 MG tablet Take 1 tablet (200 mg total) by mouth daily. 10/14/17  Yes Larey Dresser, MD  apixaban (ELIQUIS) 5 MG TABS tablet Take 1 tablet (5 mg total) by mouth 2 (two) times daily. 08/26/17  Yes Larey Dresser, MD  atorvastatin (LIPITOR) 20 MG tablet Take 1 tablet (20 mg total) by mouth daily at 6 PM. 10/17/17  Yes Danford, Suann Larry, MD  carvedilol (COREG) 3.125 MG tablet Take 1 tablet (3.125 mg total) by mouth 2 (two) times daily with a  meal. 09/17/17  Yes Larey Dresser, MD  Cholecalciferol (VITAMIN D) 2000 units CAPS Take 2,000 Units by mouth daily.    Yes [provider]  furosemide (LASIX) 20 MG tablet Take 1 tablet (20 mg total) by mouth daily. 10/14/17  Yes Larey Dresser, MD  Omega-3 Fatty Acids (FISH OIL) 1200 MG CAPS Take 1,200 mg by mouth 2 (two) times daily.    Yes [provider]  vitamin B-12 (CYANOCOBALAMIN) 1000 MCG tablet Take 1,000 mcg by mouth daily.   Yes [provider]    Family History Family History  Problem Relation Age of Onset  . Cancer - Other Mother   . CVA Father   . Heart attack Brother   . Heart attack Maternal Grandmother     Social History Social History   Tobacco Use  . Smoking status: Former Smoker    Packs/day: 1.00    Types: Cigarettes    Last attempt to quit: 01/07/1992    Years since quitting: 26.4  . Smokeless tobacco: Never Used  Substance Use Topics  . Alcohol use: Yes    Alcohol/week: 2.0 standard drinks    Types: 2 Glasses of wine per week  . Drug use: No     Allergies   Keflex [cephalexin]   Review of Systems Review of Systems   Physical Exam Triage Vital Signs ED Triage Vitals  Enc Vitals Group     BP 06/26/18 0920 (!) 112/58     Pulse Rate 06/26/18 0920 91     Resp 06/26/18 0920 16     Temp 06/26/18 0920 98.2 F (36.8 C)     Temp Source 06/26/18 0920 Oral     SpO2 06/26/18 0920 97 %     Weight --      Height --      Head Circumference --      Peak Flow --      Pain Score 06/26/18 0923 0     Pain Loc --      Pain Edu? --      Excl. in [place]? --    No data found.  Updated Vital Signs BP (!) 112/58 (BP Location: Right Arm)   Pulse 91   Temp 98.2 F (36.8 C) (Oral)   Resp 16   SpO2 97%   Visual Acuity Right Eye Distance:   Left Eye Distance:   Bilateral Distance:    Right Eye Near:   Left Eye Near:    Bilateral Near:     Physical Exam  Constitutional: He is oriented to person, place, and time. He appears well-developed and well-nourished.  Eyes: Conjunctivae are normal.  Neck: Normal range of motion. Neck supple.  Pulmonary/Chest: Effort normal.  Musculoskeletal: He exhibits edema.  Unable to flex right knee  Neurological: He is alert and oriented to person, place, and time.  Skin: Skin is warm.  See photograph of right great toe  Nursing note and vitals reviewed.      UC Treatments / Results  Labs (all labs ordered are listed, but only abnormal results  are displayed) Labs Reviewed - No data to display  EKG None  Radiology No results found.  Procedures Procedures (including critical care time)  Medications Ordered in UC Medications - No data to display  Initial Impression / Assessment and Plan / UC Course  I have reviewed the triage vital signs and the nursing notes.  Pertinent labs & imaging results that were available during my care of  the patient were reviewed by me and considered in my medical decision making (see chart for details).    Final Clinical Impressions(s) / UC Diagnoses   Final diagnoses:  Avulsion of toenail, initial encounter     Discharge Instructions     Avoid vaseline products on the glue  The glue will start flaking off in 5 to 7 days.  He do not have to do anything to help the glue come off.  You will get a new nail in the next few months.  Keep the dressing on until tomorrow.    ED Prescriptions    None     Controlled Substance Prescriptions [place] Controlled Substance Registry consulted? Not Applicable   Robyn Haber, MD 06/26/18 1016

## 2018-06-26 NOTE — ED Triage Notes (Addendum)
Pt reports clipping his toenails this morning when his right great toe nail came off.  He had excessive bleeding and they were concerned so they came here to be evaluated.  Pt takes Eliquis.  Pt has bled through gauze, paper tape and two socks.  I applied a pressure dressing to the toe until a provider can assess him.

## 2018-06-27 DIAGNOSIS — Z7901 Long term (current) use of anticoagulants: Secondary | ICD-10-CM | POA: Diagnosis not present

## 2018-06-27 DIAGNOSIS — E1151 Type 2 diabetes mellitus with diabetic peripheral angiopathy without gangrene: Secondary | ICD-10-CM | POA: Diagnosis not present

## 2018-06-27 DIAGNOSIS — Z6836 Body mass index (BMI) 36.0-36.9, adult: Secondary | ICD-10-CM | POA: Diagnosis not present

## 2018-06-27 DIAGNOSIS — I10 Essential (primary) hypertension: Secondary | ICD-10-CM | POA: Diagnosis not present

## 2018-06-27 DIAGNOSIS — M160 Bilateral primary osteoarthritis of hip: Secondary | ICD-10-CM | POA: Diagnosis not present

## 2018-06-27 DIAGNOSIS — I480 Paroxysmal atrial fibrillation: Secondary | ICD-10-CM | POA: Diagnosis not present

## 2018-06-27 DIAGNOSIS — E7800 Pure hypercholesterolemia, unspecified: Secondary | ICD-10-CM | POA: Diagnosis not present

## 2018-07-03 ENCOUNTER — Ambulatory Visit (INDEPENDENT_AMBULATORY_CARE_PROVIDER_SITE_OTHER): Payer: Medicare HMO | Admitting: *Deleted

## 2018-07-03 DIAGNOSIS — I442 Atrioventricular block, complete: Secondary | ICD-10-CM

## 2018-07-03 DIAGNOSIS — I5032 Chronic diastolic (congestive) heart failure: Secondary | ICD-10-CM

## 2018-07-03 NOTE — Progress Notes (Signed)
Remote pacemaker transmission.   

## 2018-07-09 ENCOUNTER — Emergency Department (HOSPITAL_COMMUNITY): Payer: Worker's Compensation

## 2018-07-09 ENCOUNTER — Emergency Department (HOSPITAL_COMMUNITY)
Admission: EM | Admit: 2018-07-09 | Discharge: 2018-07-09 | Disposition: A | Discharge status: Home / Self Care | Payer: Worker's Compensation | Attending: Emergency Medicine | Admitting: Emergency Medicine

## 2018-07-09 ENCOUNTER — Encounter (HOSPITAL_COMMUNITY): Payer: Self-pay

## 2018-07-09 DIAGNOSIS — E119 Type 2 diabetes mellitus without complications: Secondary | ICD-10-CM | POA: Diagnosis not present

## 2018-07-09 DIAGNOSIS — Y9301 Activity, walking, marching and hiking: Secondary | ICD-10-CM | POA: Insufficient documentation

## 2018-07-09 DIAGNOSIS — Y929 Unspecified place or not applicable: Secondary | ICD-10-CM | POA: Insufficient documentation

## 2018-07-09 DIAGNOSIS — S0093XA Contusion of unspecified part of head, initial encounter: Secondary | ICD-10-CM | POA: Diagnosis not present

## 2018-07-09 DIAGNOSIS — Y999 Unspecified external cause status: Secondary | ICD-10-CM | POA: Diagnosis not present

## 2018-07-09 DIAGNOSIS — I110 Hypertensive heart disease with heart failure: Secondary | ICD-10-CM | POA: Diagnosis not present

## 2018-07-09 DIAGNOSIS — Z87891 Personal history of nicotine dependence: Secondary | ICD-10-CM | POA: Diagnosis not present

## 2018-07-09 DIAGNOSIS — W19XXXA Unspecified fall, initial encounter: Secondary | ICD-10-CM | POA: Insufficient documentation

## 2018-07-09 DIAGNOSIS — Z79899 Other long term (current) drug therapy: Secondary | ICD-10-CM | POA: Diagnosis not present

## 2018-07-09 DIAGNOSIS — S0512XA Contusion of eyeball and orbital tissues, left eye, initial encounter: Secondary | ICD-10-CM | POA: Diagnosis not present

## 2018-07-09 DIAGNOSIS — W101XXA Fall (on)(from) sidewalk curb, initial encounter: Secondary | ICD-10-CM

## 2018-07-09 DIAGNOSIS — I509 Heart failure, unspecified: Secondary | ICD-10-CM | POA: Diagnosis not present

## 2018-07-09 DIAGNOSIS — S199XXA Unspecified injury of neck, initial encounter: Secondary | ICD-10-CM | POA: Diagnosis not present

## 2018-07-09 DIAGNOSIS — S0083XA Contusion of other part of head, initial encounter: Secondary | ICD-10-CM | POA: Diagnosis not present

## 2018-07-09 DIAGNOSIS — S0990XA Unspecified injury of head, initial encounter: Secondary | ICD-10-CM | POA: Diagnosis present

## 2018-07-09 DIAGNOSIS — R220 Localized swelling, mass and lump, head: Secondary | ICD-10-CM | POA: Diagnosis not present

## 2018-07-09 NOTE — ED Triage Notes (Signed)
Patient fell at 0630 am after legs gave out, states that his legs give out often. No loc. Seen at clinic and to follow-up next week. No loc. Swelling and bruising to left side of face and orbit, bruising noted. Iced earlier today. No neck, no back pain. ambulatory

## 2018-07-09 NOTE — Discharge Instructions (Addendum)
You have been evaluated for your fall.  You have a large bruise to the left side of your face.  Please apply ice pack intermittently throughout the day to help with swelling.  Follow up with your eye specialist Dr. Katy Fitch as needed if you notice any vision changes.  Take tylenol as needed for pain. Return if you have any concerns.

## 2018-07-09 NOTE — ED Provider Notes (Signed)
[facility] EMERGENCY DEPARTMENT Provider Note   CSN: [id number] Arrival date & time: 07/09/18  1523     History   Chief Complaint No chief complaint on file.   HPI Dylan [name] is a 84 y.o. male.  The history is provided by the patient. No language interpreter was used.     84 year old male with history of diabetes, CHF, atrial fibrillation currently on Eliquis, [employer] pacemaker presenting for evaluation of a recent fall.  Patient report at 6:30 this morning he was walking, his knees may have gave out on him and he fell forward striking his face against a hard surface.  He denies any loss of consciousness.  He did suffer bruising to the right side of his face.  He was able to get up, it was witnessed by people around him who was able to aid with his assistance.  He went to a local clinic to be evaluated.  He was recommended to go to the hospital to get CT scan.  Patient denies any precipitating symptoms prior to the fall.  He does complain of throbbing pain primarily to the left side of his face and states the swelling has increased since.  Pain is moderate in severity, nonradiating.  No complaint of neck pain, no active chest pain, trouble breathing, abdominal pain, back pain, focal numbness or weakness or dysuria.  He did receive a tetanus shot today.  He does not wear any contact lenses.  Past Medical History:  Diagnosis Date  . Hyperglycemia   . Hyperlipidemia   . Hypertension   . Lung nodule     Patient Active Problem List   Diagnosis Date Noted  . Periorbital hematoma of left eye 10/15/2017  . Fall 10/15/2017  . HCAP (healthcare-associated pneumonia) 10/14/2017  . Bradycardia 10/03/2017  . Acute CHF (congestive heart failure) ([place]) 08/03/2017  . Atrial fibrillation ([place]) 07/14/2017  . Diastolic dysfunction with acute on chronic heart failure ([place]) 07/14/2017  . DOE (dyspnea on exertion) 08/24/2014  . Benign essential HTN 08/24/2014  . DM II  (diabetes mellitus, type II), controlled ([place]) 08/24/2014  . Dyslipidemia 08/24/2014    Past Surgical History:  Procedure Laterality Date  . APPENDECTOMY  1956  . BACK SURGERY  2008  . CARDIAC CATHETERIZATION  1995   negative results  . CARDIOVERSION N/A 08/05/2017   Procedure: CARDIOVERSION;  Surgeon: Larey Dresser, MD;  Location: [facility] ENDOSCOPY;  Service: Cardiovascular;  Laterality: N/A;  . CARDIOVERSION N/A 09/12/2017   Procedure: CARDIOVERSION;  Surgeon: Larey Dresser, MD;  Location: [facility] ENDOSCOPY;  Service: Cardiovascular;  Laterality: N/A;  . COLON SURGERY  07/09/12  . [place]  . HOT HEMOSTASIS  01/08/2012   Procedure: HOT HEMOSTASIS (ARGON PLASMA COAGULATION/BICAP);  Surgeon: Arta Silence, MD;  Location: [facility] ENDOSCOPY;  Service: Endoscopy;  Laterality: N/A;  . LAPAROSCOPIC ILEOCECECTOMY  07/09/2012   Procedure: LAPAROSCOPIC ILEOCECECTOMY;  Surgeon: Edward Jolly, MD;  Location: [facility] ORS;  Service: General;  Laterality: N/A;  Laparoscopic Ileocecectomy  . PACEMAKER IMPLANT N/A 10/05/2017   Procedure: PACEMAKER IMPLANT;  Surgeon: Deboraha Sprang, MD;  Location: [place] CV LAB;  Service: Cardiovascular;  Laterality: N/A;  . REPLACEMENT TOTAL KNEE  2010  . TEE WITHOUT CARDIOVERSION N/A 08/05/2017   Procedure: TRANSESOPHAGEAL ECHOCARDIOGRAM (TEE);  Surgeon: Larey Dresser, MD;  Location: [facility] ENDOSCOPY;  Service: Cardiovascular;  Laterality: N/A;  . TEE WITHOUT CARDIOVERSION N/A 09/12/2017   Procedure: TRANSESOPHAGEAL ECHOCARDIOGRAM (TEE);  Surgeon: Loralie Champagne  S, MD;  Location: [place];  Service: Cardiovascular;  Laterality: N/A;        Home Medications    Prior to Admission medications   Medication Sig Start Date End Date Taking? Authorizing Provider  amiodarone (PACERONE) 200 MG tablet Take 1 tablet (200 mg total) by mouth daily. 10/14/17   Larey Dresser, MD  apixaban (ELIQUIS) 5 MG TABS tablet Take 1 tablet (5 mg total) by mouth 2 (two) times  daily. 08/26/17   Larey Dresser, MD  atorvastatin (LIPITOR) 20 MG tablet Take 1 tablet (20 mg total) by mouth daily at 6 PM. 10/17/17   Danford, Suann Larry, MD  carvedilol (COREG) 3.125 MG tablet Take 1 tablet (3.125 mg total) by mouth 2 (two) times daily with a meal. 09/17/17   Larey Dresser, MD  Cholecalciferol (VITAMIN D) 2000 units CAPS Take 2,000 Units by mouth daily.     [provider]  furosemide (LASIX) 20 MG tablet Take 1 tablet (20 mg total) by mouth daily. 10/14/17   Larey Dresser, MD  Omega-3 Fatty Acids (FISH OIL) 1200 MG CAPS Take 1,200 mg by mouth 2 (two) times daily.     [provider]  vitamin B-12 (CYANOCOBALAMIN) 1000 MCG tablet Take 1,000 mcg by mouth daily.    [provider]    Family History Family History  Problem Relation Age of Onset  . Cancer - Other Mother   . CVA Father   . Heart attack Brother   . Heart attack Maternal Grandmother     Social History Social History   Tobacco Use  . Smoking status: Former Smoker    Packs/day: 1.00    Types: Cigarettes    Last attempt to quit: 01/07/1992    Years since quitting: 26.5  . Smokeless tobacco: Never Used  Substance Use Topics  . Alcohol use: Yes    Alcohol/week: 2.0 standard drinks    Types: 2 Glasses of wine per week  . Drug use: No     Allergies   Keflex [cephalexin]   Review of Systems Review of Systems  All other systems reviewed and are negative.    Physical Exam Updated Vital Signs BP (!) 173/96   Pulse 71   Temp 98.3 F (36.8 C) (Oral)   Resp 18   SpO2 98%   Physical Exam  Constitutional: He is oriented to person, place, and time. He appears well-developed and well-nourished. No distress.  HENT:  Head: Normocephalic.  Large hematoma about the left orbital region that is tender to palpation but no crepitus.  Difficult to inspect left eye due to swelling.  No midface tenderness, no malocclusion, no hemotympanum or septal hematoma.  Eyes:  Right  eye with reactive pupil and extraocular movement is normal.  Difficult to assess left eye but patient does not complain of any pain with eye movement.  Neck: Normal range of motion. Neck supple.  No cervical midline spine tenderness  Cardiovascular: Normal rate and regular rhythm.  Pulmonary/Chest: Effort normal and breath sounds normal.  Abdominal: He exhibits no distension. There is no tenderness.  Neurological: He is alert and oriented to person, place, and time. He has normal strength. No cranial nerve deficit or sensory deficit. GCS eye subscore is 4. GCS verbal subscore is 5. GCS motor subscore is 6.  Skin: No rash noted.  Psychiatric: He has a normal mood and affect.  Nursing note and vitals reviewed.    ED Treatments / Results  Labs (all  labs ordered are listed, but only abnormal results are displayed) Labs Reviewed - No data to display  EKG None  Radiology Ct Head Wo Contrast  Result Date: 07/09/2018 CLINICAL DATA:  Fall, swelling/bruising to left face and orbit EXAM: CT HEAD WITHOUT CONTRAST CT MAXILLOFACIAL WITHOUT CONTRAST CT CERVICAL SPINE WITHOUT CONTRAST TECHNIQUE: Multidetector CT imaging of the head, cervical spine, and maxillofacial structures were performed using the standard protocol without intravenous contrast. Multiplanar CT image reconstructions of the cervical spine and maxillofacial structures were also generated. COMPARISON:  10/14/2017 FINDINGS: CT HEAD FINDINGS Brain: No evidence of acute infarction, hemorrhage, hydrocephalus, extra-axial collection or mass lesion/mass effect. Mild cortical and central atrophy. Subcortical white matter and periventricular small vessel ischemic changes. Vascular: Mild intracranial atherosclerosis. Skull: Normal. Negative for fracture or focal lesion. Other: Moderate to severe soft tissue swelling with a large extracranial hematoma overlying the left frontal bone, measuring at least 1.6 x 5.4 cm (series 4/image 13), incompletely  visualized. CT MAXILLOFACIAL FINDINGS Osseous: No evidence of maxillofacial fracture. Orbits: Bilateral globes are intact. Retroconal soft tissues are within normal limits. No convincing postseptal hematoma. Sinuses: The visualized paranasal sinuses are essentially clear. The mastoid air cells are unopacified. Prior right mastoidectomy. Soft tissues: Large extracranial hematoma overlying the left orbit, lateral zygoma, left maxilla, and bilateral nasal bridge. CT CERVICAL SPINE FINDINGS Alignment: Straightening of the cervical spine. Skull base and vertebrae: No acute fracture. No primary bone lesion or focal pathologic process. Soft tissues and spinal canal: No prevertebral fluid or swelling. No visible canal hematoma. Disc levels: Moderate multilevel degenerative changes. Spinal canal is patent. Upper chest: Visualized thyroid is unremarkable. Other: None. IMPRESSION: Large extracranial hematoma overlying the left frontal bone. No evidence of calvarial fracture. Mild atrophy with small vessel ischemic changes. Large extrarenal hematoma overlying the left orbit, lateral zygoma, left maxilla, and bilateral nasal bridge. No evidence of maxillofacial fracture. No convincing postseptal hematoma. Bilateral globes are intact. No evidence of traumatic injury to the cervical spine. Moderate multilevel degenerative changes. Electronically Signed   By: Julian Hy M.D.   On: 07/09/2018 16:44   Ct Cervical Spine Wo Contrast  Result Date: 07/09/2018 CLINICAL DATA:  Fall, swelling/bruising to left face and orbit EXAM: CT HEAD WITHOUT CONTRAST CT MAXILLOFACIAL WITHOUT CONTRAST CT CERVICAL SPINE WITHOUT CONTRAST TECHNIQUE: Multidetector CT imaging of the head, cervical spine, and maxillofacial structures were performed using the standard protocol without intravenous contrast. Multiplanar CT image reconstructions of the cervical spine and maxillofacial structures were also generated. COMPARISON:  10/14/2017 FINDINGS: CT  HEAD FINDINGS Brain: No evidence of acute infarction, hemorrhage, hydrocephalus, extra-axial collection or mass lesion/mass effect. Mild cortical and central atrophy. Subcortical white matter and periventricular small vessel ischemic changes. Vascular: Mild intracranial atherosclerosis. Skull: Normal. Negative for fracture or focal lesion. Other: Moderate to severe soft tissue swelling with a large extracranial hematoma overlying the left frontal bone, measuring at least 1.6 x 5.4 cm (series 4/image 13), incompletely visualized. CT MAXILLOFACIAL FINDINGS Osseous: No evidence of maxillofacial fracture. Orbits: Bilateral globes are intact. Retroconal soft tissues are within normal limits. No convincing postseptal hematoma. Sinuses: The visualized paranasal sinuses are essentially clear. The mastoid air cells are unopacified. Prior right mastoidectomy. Soft tissues: Large extracranial hematoma overlying the left orbit, lateral zygoma, left maxilla, and bilateral nasal bridge. CT CERVICAL SPINE FINDINGS Alignment: Straightening of the cervical spine. Skull base and vertebrae: No acute fracture. No primary bone lesion or focal pathologic process. Soft tissues and spinal canal: No prevertebral fluid or swelling. No  visible canal hematoma. Disc levels: Moderate multilevel degenerative changes. Spinal canal is patent. Upper chest: Visualized thyroid is unremarkable. Other: None. IMPRESSION: Large extracranial hematoma overlying the left frontal bone. No evidence of calvarial fracture. Mild atrophy with small vessel ischemic changes. Large extrarenal hematoma overlying the left orbit, lateral zygoma, left maxilla, and bilateral nasal bridge. No evidence of maxillofacial fracture. No convincing postseptal hematoma. Bilateral globes are intact. No evidence of traumatic injury to the cervical spine. Moderate multilevel degenerative changes. Electronically Signed   By: Julian Hy M.D.   On: 07/09/2018 16:44   Ct  Maxillofacial Wo Contrast  Result Date: 07/09/2018 CLINICAL DATA:  Fall, swelling/bruising to left face and orbit EXAM: CT HEAD WITHOUT CONTRAST CT MAXILLOFACIAL WITHOUT CONTRAST CT CERVICAL SPINE WITHOUT CONTRAST TECHNIQUE: Multidetector CT imaging of the head, cervical spine, and maxillofacial structures were performed using the standard protocol without intravenous contrast. Multiplanar CT image reconstructions of the cervical spine and maxillofacial structures were also generated. COMPARISON:  10/14/2017 FINDINGS: CT HEAD FINDINGS Brain: No evidence of acute infarction, hemorrhage, hydrocephalus, extra-axial collection or mass lesion/mass effect. Mild cortical and central atrophy. Subcortical white matter and periventricular small vessel ischemic changes. Vascular: Mild intracranial atherosclerosis. Skull: Normal. Negative for fracture or focal lesion. Other: Moderate to severe soft tissue swelling with a large extracranial hematoma overlying the left frontal bone, measuring at least 1.6 x 5.4 cm (series 4/image 13), incompletely visualized. CT MAXILLOFACIAL FINDINGS Osseous: No evidence of maxillofacial fracture. Orbits: Bilateral globes are intact. Retroconal soft tissues are within normal limits. No convincing postseptal hematoma. Sinuses: The visualized paranasal sinuses are essentially clear. The mastoid air cells are unopacified. Prior right mastoidectomy. Soft tissues: Large extracranial hematoma overlying the left orbit, lateral zygoma, left maxilla, and bilateral nasal bridge. CT CERVICAL SPINE FINDINGS Alignment: Straightening of the cervical spine. Skull base and vertebrae: No acute fracture. No primary bone lesion or focal pathologic process. Soft tissues and spinal canal: No prevertebral fluid or swelling. No visible canal hematoma. Disc levels: Moderate multilevel degenerative changes. Spinal canal is patent. Upper chest: Visualized thyroid is unremarkable. Other: None. IMPRESSION: Large  extracranial hematoma overlying the left frontal bone. No evidence of calvarial fracture. Mild atrophy with small vessel ischemic changes. Large extrarenal hematoma overlying the left orbit, lateral zygoma, left maxilla, and bilateral nasal bridge. No evidence of maxillofacial fracture. No convincing postseptal hematoma. Bilateral globes are intact. No evidence of traumatic injury to the cervical spine. Moderate multilevel degenerative changes. Electronically Signed   By: Julian Hy M.D.   On: 07/09/2018 16:44    Procedures Procedures (including critical care time)  Medications Ordered in ED Medications - No data to display   Initial Impression / Assessment and Plan / ED Course  I have reviewed the triage vital signs and the nursing notes.  Pertinent labs & imaging results that were available during my care of the patient were reviewed by me and considered in my medical decision making (see chart for details).     BP (!) 173/96   Pulse 71   Temp 98.3 F (36.8 C) (Oral)   Resp 18   SpO2 98%    Final Clinical Impressions(s) / ED Diagnoses   Final diagnoses:  Fall (on)(from) sidewalk curb, initial encounter  Hematoma of face, initial encounter    ED Discharge Orders    None     3:45 PM Pt here for mechanical fall, injuring face.  He has large hematoma about the L orbital region.  He is on Eliquis.  No other injury noted.  He did received tdap PTA.  This is a fall from ground level.  Will obtain head/maxillofacial/neck CT for further evaluation.  Care discussed with Dr. Venora Maples.   4:50 PM CT scan of head, cervical spine and maxillofacial demonstrate large extracranial hematoma overlying the left frontal bone without any acute fractures or intracranial injury.  The finding is consistent with physical exam.  Patient is currently mentating appropriately.  Patient pacemaker was also interrogated and appears to be functioning appropriately.  5:34 PM I discussed care with Dr.  Venora Maples who felt pt is stable for discharge.  Encourage pt to take tylenol as needed for pain, and to f/u with eye specialist as needed.  Return precaution given.    Domenic Moras, PA-C 07/09/18 [place], MD 07/10/18 [phone]

## 2018-07-16 DIAGNOSIS — S0990XA Unspecified injury of head, initial encounter: Secondary | ICD-10-CM | POA: Diagnosis not present

## 2018-07-16 DIAGNOSIS — E119 Type 2 diabetes mellitus without complications: Secondary | ICD-10-CM | POA: Diagnosis not present

## 2018-07-16 DIAGNOSIS — H1132 Conjunctival hemorrhage, left eye: Secondary | ICD-10-CM | POA: Diagnosis not present

## 2018-07-16 DIAGNOSIS — H43813 Vitreous degeneration, bilateral: Secondary | ICD-10-CM | POA: Diagnosis not present

## 2018-07-16 DIAGNOSIS — Z961 Presence of intraocular lens: Secondary | ICD-10-CM | POA: Diagnosis not present

## 2018-07-31 DIAGNOSIS — L700 Acne vulgaris: Secondary | ICD-10-CM | POA: Diagnosis not present

## 2018-07-31 DIAGNOSIS — L570 Actinic keratosis: Secondary | ICD-10-CM | POA: Diagnosis not present

## 2018-07-31 DIAGNOSIS — L239 Allergic contact dermatitis, unspecified cause: Secondary | ICD-10-CM | POA: Diagnosis not present

## 2018-07-31 DIAGNOSIS — D1801 Hemangioma of skin and subcutaneous tissue: Secondary | ICD-10-CM | POA: Diagnosis not present

## 2018-07-31 DIAGNOSIS — L814 Other melanin hyperpigmentation: Secondary | ICD-10-CM | POA: Diagnosis not present

## 2018-07-31 DIAGNOSIS — L821 Other seborrheic keratosis: Secondary | ICD-10-CM | POA: Diagnosis not present

## 2018-07-31 DIAGNOSIS — Z85828 Personal history of other malignant neoplasm of skin: Secondary | ICD-10-CM | POA: Diagnosis not present

## 2018-07-31 DIAGNOSIS — L820 Inflamed seborrheic keratosis: Secondary | ICD-10-CM | POA: Diagnosis not present

## 2018-08-28 DIAGNOSIS — C44319 Basal cell carcinoma of skin of other parts of face: Secondary | ICD-10-CM | POA: Diagnosis not present

## 2018-08-28 DIAGNOSIS — D485 Neoplasm of uncertain behavior of skin: Secondary | ICD-10-CM | POA: Diagnosis not present

## 2018-08-28 DIAGNOSIS — Z85828 Personal history of other malignant neoplasm of skin: Secondary | ICD-10-CM | POA: Diagnosis not present

## 2018-08-28 DIAGNOSIS — C4441 Basal cell carcinoma of skin of scalp and neck: Secondary | ICD-10-CM | POA: Diagnosis not present

## 2018-09-01 LAB — CUP PACEART REMOTE DEVICE CHECK
Brady Statistic AP VS Percent: 94.72 %
Brady Statistic AS VP Percent: 0 %
Brady Statistic AS VS Percent: 0.35 %
Brady Statistic RV Percent Paced: 4.93 %
Implantable Lead Implant Date: 20190216
Implantable Lead Location: 753859
Implantable Lead Location: 753860
Lead Channel Impedance Value: 323 Ohm
Lead Channel Impedance Value: 456 Ohm
Lead Channel Impedance Value: 475 Ohm
Lead Channel Pacing Threshold Amplitude: 1 V
Lead Channel Pacing Threshold Pulse Width: 0.4 ms
Lead Channel Sensing Intrinsic Amplitude: 1.375 mV
Lead Channel Sensing Intrinsic Amplitude: 1.375 mV
Lead Channel Sensing Intrinsic Amplitude: 4.5 mV
Lead Channel Sensing Intrinsic Amplitude: 4.5 mV
Lead Channel Setting Pacing Amplitude: 2.5 V
Lead Channel Setting Pacing Pulse Width: 0.4 ms
Lead Channel Setting Sensing Sensitivity: 1.2 mV
MDC IDC LEAD IMPLANT DT: 20190216
MDC IDC MSMT BATTERY REMAINING LONGEVITY: 135 mo
MDC IDC MSMT BATTERY VOLTAGE: 3.06 V
MDC IDC MSMT LEADCHNL RA PACING THRESHOLD AMPLITUDE: 0.75 V
MDC IDC MSMT LEADCHNL RA PACING THRESHOLD PULSEWIDTH: 0.4 ms
MDC IDC MSMT LEADCHNL RV IMPEDANCE VALUE: 361 Ohm
MDC IDC PG IMPLANT DT: 20190216
MDC IDC SESS DTM: 20191114041459
MDC IDC SET LEADCHNL RA PACING AMPLITUDE: 2.5 V
MDC IDC STAT BRADY AP VP PERCENT: 4.93 %
MDC IDC STAT BRADY RA PERCENT PACED: 99.95 %

## 2018-10-02 ENCOUNTER — Ambulatory Visit (INDEPENDENT_AMBULATORY_CARE_PROVIDER_SITE_OTHER): Payer: Medicare HMO

## 2018-10-02 DIAGNOSIS — I442 Atrioventricular block, complete: Secondary | ICD-10-CM | POA: Diagnosis not present

## 2018-10-03 LAB — CUP PACEART REMOTE DEVICE CHECK
Brady Statistic AP VP Percent: 4.2 %
Brady Statistic AP VS Percent: 95.54 %
Brady Statistic AS VP Percent: 0 %
Brady Statistic AS VS Percent: 0.26 %
Brady Statistic RV Percent Paced: 4.2 %
Date Time Interrogation Session: 20200213033243
Implantable Lead Implant Date: 20190216
Implantable Pulse Generator Implant Date: 20190216
Lead Channel Impedance Value: 323 Ohm
Lead Channel Impedance Value: 342 Ohm
Lead Channel Impedance Value: 437 Ohm
Lead Channel Impedance Value: 475 Ohm
Lead Channel Pacing Threshold Amplitude: 1 V
Lead Channel Sensing Intrinsic Amplitude: 1.5 mV
Lead Channel Sensing Intrinsic Amplitude: 3.75 mV
Lead Channel Sensing Intrinsic Amplitude: 3.75 mV
MDC IDC LEAD IMPLANT DT: 20190216
MDC IDC LEAD LOCATION: 753859
MDC IDC LEAD LOCATION: 753860
MDC IDC MSMT BATTERY REMAINING LONGEVITY: 135 mo
MDC IDC MSMT BATTERY VOLTAGE: 3.04 V
MDC IDC MSMT LEADCHNL RA PACING THRESHOLD AMPLITUDE: 0.875 V
MDC IDC MSMT LEADCHNL RA PACING THRESHOLD PULSEWIDTH: 0.4 ms
MDC IDC MSMT LEADCHNL RA SENSING INTR AMPL: 1.5 mV
MDC IDC MSMT LEADCHNL RV PACING THRESHOLD PULSEWIDTH: 0.4 ms
MDC IDC SET LEADCHNL RA PACING AMPLITUDE: 2.25 V
MDC IDC SET LEADCHNL RV PACING AMPLITUDE: 2.5 V
MDC IDC SET LEADCHNL RV PACING PULSEWIDTH: 0.4 ms
MDC IDC SET LEADCHNL RV SENSING SENSITIVITY: 1.2 mV
MDC IDC STAT BRADY RA PERCENT PACED: 99.96 %

## 2018-10-11 ENCOUNTER — Other Ambulatory Visit (HOSPITAL_COMMUNITY): Payer: Self-pay | Admitting: Cardiology

## 2018-10-14 NOTE — Progress Notes (Signed)
Remote pacemaker transmission.   

## 2018-10-15 ENCOUNTER — Other Ambulatory Visit (HOSPITAL_COMMUNITY): Payer: Self-pay

## 2018-11-09 ENCOUNTER — Other Ambulatory Visit (HOSPITAL_COMMUNITY): Payer: Self-pay | Admitting: Cardiology

## 2018-12-09 ENCOUNTER — Other Ambulatory Visit (HOSPITAL_COMMUNITY): Payer: Self-pay | Admitting: Internal Medicine

## 2018-12-18 DIAGNOSIS — Z125 Encounter for screening for malignant neoplasm of prostate: Secondary | ICD-10-CM | POA: Diagnosis not present

## 2018-12-18 DIAGNOSIS — I10 Essential (primary) hypertension: Secondary | ICD-10-CM | POA: Diagnosis not present

## 2018-12-18 DIAGNOSIS — E1151 Type 2 diabetes mellitus with diabetic peripheral angiopathy without gangrene: Secondary | ICD-10-CM | POA: Diagnosis not present

## 2018-12-18 DIAGNOSIS — E7800 Pure hypercholesterolemia, unspecified: Secondary | ICD-10-CM | POA: Diagnosis not present

## 2018-12-19 DIAGNOSIS — R82998 Other abnormal findings in urine: Secondary | ICD-10-CM | POA: Diagnosis not present

## 2018-12-19 DIAGNOSIS — I10 Essential (primary) hypertension: Secondary | ICD-10-CM | POA: Diagnosis not present

## 2018-12-25 ENCOUNTER — Telehealth: Payer: Self-pay

## 2018-12-25 DIAGNOSIS — I5032 Chronic diastolic (congestive) heart failure: Secondary | ICD-10-CM | POA: Diagnosis not present

## 2018-12-25 DIAGNOSIS — Z7901 Long term (current) use of anticoagulants: Secondary | ICD-10-CM | POA: Diagnosis not present

## 2018-12-25 DIAGNOSIS — I480 Paroxysmal atrial fibrillation: Secondary | ICD-10-CM | POA: Diagnosis not present

## 2018-12-25 DIAGNOSIS — Z950 Presence of cardiac pacemaker: Secondary | ICD-10-CM | POA: Diagnosis not present

## 2018-12-25 DIAGNOSIS — M160 Bilateral primary osteoarthritis of hip: Secondary | ICD-10-CM | POA: Diagnosis not present

## 2018-12-25 DIAGNOSIS — E1151 Type 2 diabetes mellitus with diabetic peripheral angiopathy without gangrene: Secondary | ICD-10-CM | POA: Diagnosis not present

## 2018-12-25 DIAGNOSIS — E7800 Pure hypercholesterolemia, unspecified: Secondary | ICD-10-CM | POA: Diagnosis not present

## 2018-12-25 DIAGNOSIS — Z0000 Encounter for general adult medical examination without abnormal findings: Secondary | ICD-10-CM | POA: Diagnosis not present

## 2018-12-25 DIAGNOSIS — I110 Hypertensive heart disease with heart failure: Secondary | ICD-10-CM | POA: Diagnosis not present

## 2018-12-25 NOTE — Telephone Encounter (Signed)
I tried to call patient about upcoming appointment on 01/01/19, we need to switch this to a telehealth visit. There was no answer and no voicemail.

## 2018-12-26 NOTE — Telephone Encounter (Signed)
I called and spoke with patient and his wife, she has a smart phone and can do a video visit. Moved patients appointment to Friday to fill up the spots.      Virtual Visit Pre-Appointment Phone Call  "(Name), I am calling you today to discuss your upcoming appointment. We are currently trying to limit exposure to the virus that causes COVID-19 by seeing patients at home rather than in the office."  1. "What is the BEST phone number to call the day of the visit?" - include this in appointment notes  2. "Do you have or have access to (through a family member/friend) a smartphone with video capability that we can use for your visit?" a. If yes - list this number in appt notes as "cell" (if different from BEST phone #) and list the appointment type as a VIDEO visit in appointment notes b. If no - list the appointment type as a PHONE visit in appointment notes  3. Confirm consent - "In the setting of the current Covid19 crisis, you are scheduled for a (phone or video) visit with your provider on (date) at (time).  Just as we do with many in-office visits, in order for you to participate in this visit, we must obtain consent.  If you'd like, I can send this to your mychart (if signed up) or email for you to review.  Otherwise, I can obtain your verbal consent now.  All virtual visits are billed to your insurance company just like a normal visit would be.  By agreeing to a virtual visit, we'd like you to understand that the technology does not allow for your provider to perform an examination, and thus may limit your provider's ability to fully assess your condition. If your provider identifies any concerns that need to be evaluated in person, we will make arrangements to do so.  Finally, though the technology is pretty good, we cannot assure that it will always work on either your or our end, and in the setting of a video visit, we may have to convert it to a phone-only visit.  In either situation, we  cannot ensure that we have a secure connection.  Are you willing to proceed?" STAFF: Did the patient verbally acknowledge consent to telehealth visit? Document YES/NO here: YES  4. Advise patient to be prepared - "Two hours prior to your appointment, go ahead and check your blood pressure, pulse, oxygen saturation, and your weight (if you have the equipment to check those) and write them all down. When your visit starts, your provider will ask you for this information. If you have an Apple Watch or Kardia device, please plan to have heart rate information ready on the day of your appointment. Please have a pen and paper handy nearby the day of the visit as well."  5. Give patient instructions for MyChart download to smartphone OR Doximity/Doxy.me as below if video visit (depending on what platform provider is using)  6. Inform patient they will receive a phone call 15 minutes prior to their appointment time (may be from unknown caller ID) so they should be prepared to answer    TELEPHONE CALL NOTE  Dylan [name] has been deemed a candidate for a follow-up tele-health visit to limit community exposure during the Covid-19 pandemic. I spoke with the patient via phone to ensure availability of phone/video source, confirm preferred email & phone number, and discuss instructions and expectations.  I reminded Dylan [name] to be prepared with  any vital sign and/or heart rhythm information that could potentially be obtained via home monitoring, at the time of his visit. I reminded Dylan [name] to expect a phone call prior to his visit.  Mady Haagensen, [place] 12/26/2018 3:03 PM   INSTRUCTIONS FOR DOWNLOADING THE MYCHART APP TO SMARTPHONE  - The patient must first make sure to have activated MyChart and know their login information - If Apple, go to [employer] and type in MyChart in the search bar and download the app. If Android, ask patient to go to [employer] and type in [place] in the search  bar and download the app. The app is free but as with any other app downloads, their phone may require them to verify saved payment information or Apple/Android password.  - The patient will need to then log into the app with their MyChart username and password, and select [facility] as their healthcare provider to link the account. When it is time for your visit, go to the MyChart app, find appointments, and click Begin Video Visit. Be sure to Select Allow for your device to access the Microphone and Camera for your visit. You will then be connected, and your provider will be with you shortly.  **If they have any issues connecting, or need assistance please contact MyChart service desk [phone])**  **If using a computer, in order to ensure the best quality for their visit they will need to use either of the following Internet Browsers: [employer], or Google Chrome**  IF USING DOXIMITY or DOXY.ME - The patient will receive a link just prior to their visit by text.     FULL LENGTH CONSENT FOR TELE-HEALTH VISIT   I hereby voluntarily request, consent and authorize [facility] and its employed or contracted physicians, physician assistants, nurse practitioners or other licensed health care professionals (the Practitioner), to provide me with telemedicine health care services (the "Services") as deemed necessary by the treating Practitioner. I acknowledge and consent to receive the Services by the Practitioner via telemedicine. I understand that the telemedicine visit will involve communicating with the Practitioner through live audiovisual communication technology and the disclosure of certain medical information by electronic transmission. I acknowledge that I have been given the opportunity to request an in-person assessment or other available alternative prior to the telemedicine visit and am voluntarily participating in the telemedicine visit.  I understand that I have the  right to withhold or withdraw my consent to the use of telemedicine in the course of my care at any time, without affecting my right to future care or treatment, and that the Practitioner or I may terminate the telemedicine visit at any time. I understand that I have the right to inspect all information obtained and/or recorded in the course of the telemedicine visit and may receive copies of available information for a reasonable fee.  I understand that some of the potential risks of receiving the Services via telemedicine include:  Marland Kitchen Delay or interruption in medical evaluation due to technological equipment failure or disruption; . Information transmitted may not be sufficient (e.g. poor resolution of images) to allow for appropriate medical decision making by the Practitioner; and/or  . In rare instances, security protocols could fail, causing a breach of personal health information.  Furthermore, I acknowledge that it is my responsibility to provide information about my medical history, conditions and care that is complete and accurate to the best of my ability. I acknowledge that Practitioner's advice, recommendations,  and/or decision may be based on factors not within their control, such as incomplete or inaccurate data provided by me or distortions of diagnostic images or specimens that may result from electronic transmissions. I understand that the practice of medicine is not an exact science and that Practitioner makes no warranties or guarantees regarding treatment outcomes. I acknowledge that I will receive a copy of this consent concurrently upon execution via email to the email address I last provided but may also request a printed copy by calling the office of [facility].    I understand that my insurance will be billed for this visit.   I have read or had this consent read to me. . I understand the contents of this consent, which adequately explains the benefits and risks of the Services  being provided via telemedicine.  . I have been provided ample opportunity to ask questions regarding this consent and the Services and have had my questions answered to my satisfaction. . I give my informed consent for the services to be provided through the use of telemedicine in my medical care  By participating in this telemedicine visit I agree to the above.

## 2018-12-29 ENCOUNTER — Other Ambulatory Visit (HOSPITAL_COMMUNITY): Payer: Self-pay | Admitting: Cardiology

## 2019-01-01 ENCOUNTER — Ambulatory Visit (INDEPENDENT_AMBULATORY_CARE_PROVIDER_SITE_OTHER): Payer: Medicare HMO | Admitting: *Deleted

## 2019-01-01 ENCOUNTER — Other Ambulatory Visit: Payer: Self-pay

## 2019-01-01 ENCOUNTER — Encounter: Payer: Medicare HMO | Admitting: Internal Medicine

## 2019-01-01 DIAGNOSIS — I442 Atrioventricular block, complete: Secondary | ICD-10-CM

## 2019-01-01 LAB — CUP PACEART REMOTE DEVICE CHECK
Battery Remaining Longevity: 129 mo
Battery Voltage: 3.03 V
Brady Statistic AP VP Percent: 5.83 %
Brady Statistic AP VS Percent: 93.79 %
Brady Statistic AS VP Percent: 0 %
Brady Statistic AS VS Percent: 0.38 %
Brady Statistic RA Percent Paced: 99.92 %
Brady Statistic RV Percent Paced: 5.84 %
Date Time Interrogation Session: 20200514010654
Implantable Lead Implant Date: 20190216
Implantable Lead Implant Date: 20190216
Implantable Lead Location: 753859
Implantable Lead Location: 753860
Implantable Lead Model: 5076
Implantable Lead Model: 5076
Implantable Pulse Generator Implant Date: 20190216
Lead Channel Impedance Value: 304 Ohm
Lead Channel Impedance Value: 323 Ohm
Lead Channel Impedance Value: 418 Ohm
Lead Channel Impedance Value: 418 Ohm
Lead Channel Pacing Threshold Amplitude: 0.875 V
Lead Channel Pacing Threshold Amplitude: 1 V
Lead Channel Pacing Threshold Pulse Width: 0.4 ms
Lead Channel Pacing Threshold Pulse Width: 0.4 ms
Lead Channel Sensing Intrinsic Amplitude: 1.5 mV
Lead Channel Sensing Intrinsic Amplitude: 1.5 mV
Lead Channel Sensing Intrinsic Amplitude: 1.75 mV
Lead Channel Sensing Intrinsic Amplitude: 1.75 mV
Lead Channel Setting Pacing Amplitude: 2.25 V
Lead Channel Setting Pacing Amplitude: 2.5 V
Lead Channel Setting Pacing Pulse Width: 0.4 ms
Lead Channel Setting Sensing Sensitivity: 1.2 mV

## 2019-01-02 ENCOUNTER — Telehealth (INDEPENDENT_AMBULATORY_CARE_PROVIDER_SITE_OTHER): Payer: Medicare HMO | Admitting: Internal Medicine

## 2019-01-02 ENCOUNTER — Other Ambulatory Visit: Payer: Self-pay

## 2019-01-02 ENCOUNTER — Encounter: Payer: Self-pay | Admitting: Internal Medicine

## 2019-01-02 VITALS — BP 111/67 | HR 83 | Ht 72.0 in | Wt 242.0 lb

## 2019-01-02 DIAGNOSIS — I442 Atrioventricular block, complete: Secondary | ICD-10-CM

## 2019-01-02 DIAGNOSIS — I4819 Other persistent atrial fibrillation: Secondary | ICD-10-CM

## 2019-01-02 DIAGNOSIS — Z950 Presence of cardiac pacemaker: Secondary | ICD-10-CM

## 2019-01-02 DIAGNOSIS — Z79899 Other long term (current) drug therapy: Secondary | ICD-10-CM

## 2019-01-02 DIAGNOSIS — I4891 Unspecified atrial fibrillation: Secondary | ICD-10-CM

## 2019-01-02 NOTE — Progress Notes (Signed)
Electrophysiology TeleHealth Note   Due to national recommendations of social distancing due to COVID 19, an audio/video telehealth visit is felt to be most appropriate for this patient at this time.  See MyChart message from today for the patient's consent to telehealth for [facility].   Date:  01/02/2019   ID:  Dylan [name], DOB 21-May-1926, MRN [id number]  Location: patient's home  Provider location: [address], [place]  Evaluation Performed: Follow-up visit  PCP:  Tisovec, Fransico Him, MD  Cardiologist:  Dm Electrophysiologist:  SK   Chief Complaint:  HFpEF with sinus node dysfunction  History of Present Illness:    Dylan [name] is a 85 y.o. male who presents via audio/video conferencing for a telehealth visit today.  Since last being seen in our clinic, the patient reports no dyspnea  Peripheral upper and lower extremity numbness-- can't feel buttons-- antedates amiodarone   Some edema, uses compression socks  DOE stable  No chest pain   Date Cr K Hgb TSH LFTs  3/19 1.29 3.7 13.5 3.36 22  4/20 1.1. 4.1 14.1 2.1(6/19) 15     The patient denies symptoms of fevers, chills, cough, or new SOB worrisome for COVID 19.   Past Medical History:  Diagnosis Date  . Hyperglycemia   . Hyperlipidemia   . Hypertension   . Lung nodule     Past Surgical History:  Procedure Laterality Date  . APPENDECTOMY  1956  . BACK SURGERY  2008  . CARDIAC CATHETERIZATION  1995   negative results  . CARDIOVERSION N/A 08/05/2017   Procedure: CARDIOVERSION;  Surgeon: Larey Dresser, MD;  Location: [facility] ENDOSCOPY;  Service: Cardiovascular;  Laterality: N/A;  . CARDIOVERSION N/A 09/12/2017   Procedure: CARDIOVERSION;  Surgeon: Larey Dresser, MD;  Location: [facility] ENDOSCOPY;  Service: Cardiovascular;  Laterality: N/A;  . COLON SURGERY  07/09/12  . [place]  . HOT HEMOSTASIS  01/08/2012   Procedure: HOT HEMOSTASIS (ARGON PLASMA COAGULATION/BICAP);   Surgeon: Arta Silence, MD;  Location: [facility] ENDOSCOPY;  Service: Endoscopy;  Laterality: N/A;  . LAPAROSCOPIC ILEOCECECTOMY  07/09/2012   Procedure: LAPAROSCOPIC ILEOCECECTOMY;  Surgeon: Edward Jolly, MD;  Location: [facility] ORS;  Service: General;  Laterality: N/A;  Laparoscopic Ileocecectomy  . PACEMAKER IMPLANT N/A 10/05/2017   Procedure: PACEMAKER IMPLANT;  Surgeon: Deboraha Sprang, MD;  Location: [facility] CV LAB;  Service: Cardiovascular;  Laterality: N/A;  . REPLACEMENT TOTAL KNEE  2010  . TEE WITHOUT CARDIOVERSION N/A 08/05/2017   Procedure: TRANSESOPHAGEAL ECHOCARDIOGRAM (TEE);  Surgeon: Larey Dresser, MD;  Location: [facility] ENDOSCOPY;  Service: Cardiovascular;  Laterality: N/A;  . TEE WITHOUT CARDIOVERSION N/A 09/12/2017   Procedure: TRANSESOPHAGEAL ECHOCARDIOGRAM (TEE);  Surgeon: Larey Dresser, MD;  Location: [facility] ENDOSCOPY;  Service: Cardiovascular;  Laterality: N/A;    Current Outpatient Medications  Medication Sig Dispense Refill  . acetaminophen (TYLENOL) 325 MG tablet Take 650 mg by mouth every 6 (six) hours as needed.    Marland Kitchen amiodarone (PACERONE) 200 MG tablet Take 1 tablet by mouth once daily 90 tablet 2  . atorvastatin (LIPITOR) 20 MG tablet Take 1 tablet (20 mg total) by mouth daily at 6 PM. 30 tablet 0  . carvedilol (COREG) 3.125 MG tablet Take 1 tablet (3.125 mg total) by mouth 2 (two) times daily with a meal. 60 tablet 3  . Cholecalciferol (VITAMIN D) 2000 units CAPS Take 2,000 Units by mouth daily.     Marland Kitchen  ELIQUIS 5 MG TABS tablet TAKE 1 TABLET BY MOUTH TWICE DAILY 60 tablet 6  . furosemide (LASIX) 20 MG tablet Take 1 tablet (20 mg total) by mouth daily. 30 tablet 3  . Omega-3 Fatty Acids (FISH OIL) 1200 MG CAPS Take 1,200 mg by mouth 2 (two) times daily.     . vitamin B-12 (CYANOCOBALAMIN) 1000 MCG tablet Take 1,000 mcg by mouth daily.     No current facility-administered medications for this visit.     Allergies:   Keflex [cephalexin]   Social History:  The patient   reports that he quit smoking about 27 years ago. His smoking use included cigarettes. He smoked 1.00 pack per day. He has never used smokeless tobacco. He reports current alcohol use of about 2.0 standard drinks of alcohol per week. He reports that he does not use drugs.   Family History:  The patient's   family history includes CVA in his father; Cancer - Other in his mother; Heart attack in his brother and maternal grandmother.   ROS:  Please see the history of present illness.   All other systems are personally reviewed and negative.    Exam:    Vital Signs:  BP 111/67   Pulse 83   Ht 6' (1.829 m)   Wt 242 lb (109.8 kg)   BMI 32.82 kg/m     Well appearing, alert and conversant, regular work of breathing,  good skin color Eyes- anicteric, neuro- grossly intact, skin- no apparent rash or lesions or cyanosis, mouth- oral mucosa is pink   Labs/Other Tests and Data Reviewed:    Recent Labs: 02/06/2018: ALT 18; BUN 12; Creatinine, Ser 1.18; Potassium 4.8; Sodium 140; TSH 2.102   Wt Readings from Last 3 Encounters:  01/02/19 242 lb (109.8 kg)  02/06/18 254 lb 1.9 oz (115.3 kg)  01/02/18 254 lb (115.2 kg)     Other studies personally reviewed: Additional studies/ records that were reviewed today include: As above    Last device remote is reviewed from [place] PDF dated 2/20* which reveals normal device function,   arrhythmias - atrial fib persistent 1 yr ago   ASSESSMENT & PLAN:   Sinus node dysfunction  Orthostatic intolerance  Pacemaker-Medtronic    First-degree AV block  Atrial fibrillation  Hypertension  neuropathy  High risk medication surveillance   Doing very well frim CV perspective  Very concerned re COVID  Suggested he followup with PCP re neuropathy-- will forward to Dr RT  No interval AFib  On Anticoagulation;  No bleeding issues   Tolerating the amio and surveillance labs in order x no TSH    COVID 19 screen The patient denies  symptoms of COVID 19 at this time.  The importance of social distancing was discussed today.  Follow-up: 12   Next remote: As Scheduled   Current medicines are reviewed at length with the patient today.   The patient does not have concerns regarding his medicines.  The following changes were made today:  none  Labs/ tests ordered today include: TSH  No orders of the defined types were placed in this encounter.   Future tests ( post COVID )     Patient Risk:  after full review of this patients clinical status, I feel that they are at moderate risk at this time.  Today, I have spent 14 minutes with the patient with telehealth technology discussing the above.  Signed, Virl Axe, MD  01/02/2019 9:07 AM     [facility]  [address] [place] [address] [phone] (office) [phone] (fax)

## 2019-01-02 NOTE — Patient Instructions (Addendum)
Medication Instructions:  Your physician recommends that you continue on your current medications as directed. Please refer to the Current Medication list given to you today.   Labwork: Your physician recommends that you return for lab work next week 01/07/19 between 8-4pm... TSH (Thyroid) Can eat and drink.. try to wear a face mask if you have one, any face covering will be fine   Testing/Procedures:  Follow-Up: Your physician wants you to follow-up in: 12 months with Dr. Caryl Comes.  You will receive a reminder letter in the mail two months in advance. If you don't receive a letter, please call our office to schedule the follow-up appointment.  Keep your remote device check as scheduled. Will call you with the DATE.    Any Other Special Instructions Will Be Listed Below (If Applicable).     If you need a refill on your cardiac medications before your next appointment, please call your pharmacy.

## 2019-01-07 ENCOUNTER — Other Ambulatory Visit: Payer: Medicare HMO | Admitting: *Deleted

## 2019-01-07 ENCOUNTER — Other Ambulatory Visit: Payer: Self-pay

## 2019-01-07 DIAGNOSIS — I4819 Other persistent atrial fibrillation: Secondary | ICD-10-CM

## 2019-01-07 DIAGNOSIS — Z79899 Other long term (current) drug therapy: Secondary | ICD-10-CM | POA: Diagnosis not present

## 2019-01-07 DIAGNOSIS — I4891 Unspecified atrial fibrillation: Secondary | ICD-10-CM | POA: Diagnosis not present

## 2019-01-08 ENCOUNTER — Encounter: Payer: Self-pay | Admitting: *Deleted

## 2019-01-08 ENCOUNTER — Encounter: Payer: Self-pay | Admitting: Cardiology

## 2019-01-08 LAB — TSH: TSH: 2.35 u[IU]/mL (ref 0.450–4.500)

## 2019-01-08 NOTE — Progress Notes (Signed)
Remote pacemaker transmission.   

## 2019-02-18 DIAGNOSIS — R69 Illness, unspecified: Secondary | ICD-10-CM | POA: Diagnosis not present

## 2019-02-18 DIAGNOSIS — M25462 Effusion, left knee: Secondary | ICD-10-CM | POA: Diagnosis not present

## 2019-02-18 DIAGNOSIS — I129 Hypertensive chronic kidney disease with stage 1 through stage 4 chronic kidney disease, or unspecified chronic kidney disease: Secondary | ICD-10-CM | POA: Diagnosis not present

## 2019-03-12 ENCOUNTER — Telehealth (HOSPITAL_COMMUNITY): Payer: Self-pay | Admitting: *Deleted

## 2019-03-12 DIAGNOSIS — E785 Hyperlipidemia, unspecified: Secondary | ICD-10-CM

## 2019-03-12 DIAGNOSIS — I5032 Chronic diastolic (congestive) heart failure: Secondary | ICD-10-CM

## 2019-03-12 NOTE — Telephone Encounter (Signed)
pts wife called stating she thinks lipitor is causing the patient to have leg pain. Hes had leg pain for 2 years but she said it is getting worse. She wants to know if patient can have a medication change.   Routed to [place] for advice

## 2019-03-12 NOTE — Telephone Encounter (Signed)
Stop Lipitor.  After 1 week, start Crestor 10 mg daily (less likely to cause muscle symptoms).  Lipids/LFTs in 2 months.

## 2019-03-13 MED ORDER — ROSUVASTATIN CALCIUM 10 MG PO TABS: 10.0000 mg | ORAL_TABLET | Freq: Every day | ORAL | 3 refills | Status: DC

## 2019-03-13 NOTE — Telephone Encounter (Signed)
Spoke with patients wife, advised of MD recommendations to stop lipitor THEN 1 week later to start crestor 10mg  daily.  Labs in 2 months. appt card mailed. Verbalized understanding.

## 2019-04-02 ENCOUNTER — Ambulatory Visit (INDEPENDENT_AMBULATORY_CARE_PROVIDER_SITE_OTHER): Payer: Medicare HMO | Admitting: *Deleted

## 2019-04-02 DIAGNOSIS — I442 Atrioventricular block, complete: Secondary | ICD-10-CM

## 2019-04-02 LAB — CUP PACEART REMOTE DEVICE CHECK
Battery Remaining Longevity: 127 mo
Battery Voltage: 3.01 V
Brady Statistic AP VP Percent: 11.64 %
Brady Statistic AP VS Percent: 87.66 %
Brady Statistic AS VP Percent: 0.02 %
Brady Statistic AS VS Percent: 0.69 %
Brady Statistic RA Percent Paced: 99.87 %
Brady Statistic RV Percent Paced: 11.65 %
Date Time Interrogation Session: 20200813094300
Implantable Lead Implant Date: 20190216
Implantable Lead Implant Date: 20190216
Implantable Lead Location: 753859
Implantable Lead Location: 753860
Implantable Lead Model: 5076
Implantable Lead Model: 5076
Implantable Pulse Generator Implant Date: 20190216
Lead Channel Impedance Value: 323 Ohm
Lead Channel Impedance Value: 323 Ohm
Lead Channel Impedance Value: 418 Ohm
Lead Channel Impedance Value: 437 Ohm
Lead Channel Pacing Threshold Amplitude: 0.875 V
Lead Channel Pacing Threshold Amplitude: 1.125 V
Lead Channel Pacing Threshold Pulse Width: 0.4 ms
Lead Channel Pacing Threshold Pulse Width: 0.4 ms
Lead Channel Sensing Intrinsic Amplitude: 2 mV
Lead Channel Sensing Intrinsic Amplitude: 2.5 mV
Lead Channel Setting Pacing Amplitude: 2.25 V
Lead Channel Setting Pacing Amplitude: 2.5 V
Lead Channel Setting Pacing Pulse Width: 0.4 ms
Lead Channel Setting Sensing Sensitivity: 1.2 mV

## 2019-04-13 ENCOUNTER — Encounter: Payer: Self-pay | Admitting: Cardiology

## 2019-04-13 NOTE — Progress Notes (Signed)
Remote pacemaker transmission.   

## 2019-04-30 DIAGNOSIS — Z23 Encounter for immunization: Secondary | ICD-10-CM | POA: Diagnosis not present

## 2019-05-11 ENCOUNTER — Other Ambulatory Visit: Payer: Self-pay

## 2019-05-11 ENCOUNTER — Ambulatory Visit (HOSPITAL_COMMUNITY)
Admission: RE | Admit: 2019-05-11 | Discharge: 2019-05-11 | Disposition: A | Discharge status: Home / Self Care | Payer: Medicare HMO | Source: Ambulatory Visit | Attending: Internal Medicine | Admitting: Internal Medicine

## 2019-05-11 ENCOUNTER — Other Ambulatory Visit (HOSPITAL_COMMUNITY): Payer: Self-pay | Admitting: *Deleted

## 2019-05-11 DIAGNOSIS — E785 Hyperlipidemia, unspecified: Secondary | ICD-10-CM

## 2019-05-11 LAB — HEPATIC FUNCTION PANEL
ALT: 19 U/L (ref 0–44)
AST: 19 U/L (ref 15–41)
Albumin: 3.8 g/dL (ref 3.5–5.0)
Alkaline Phosphatase: 70 U/L (ref 38–126)
Bilirubin, Direct: 0.3 mg/dL — ABNORMAL HIGH (ref 0.0–0.2)
Indirect Bilirubin: 0.9 mg/dL (ref 0.3–0.9)
Total Bilirubin: 1.2 mg/dL (ref 0.3–1.2)
Total Protein: 6.3 g/dL — ABNORMAL LOW (ref 6.5–8.1)

## 2019-05-11 LAB — LIPID PANEL
Cholesterol: 108 mg/dL (ref 0–200)
HDL: 43 mg/dL (ref >40)
LDL Cholesterol: 52 mg/dL (ref 0–99)
Total CHOL/HDL Ratio: 2.5 RATIO
Triglycerides: 67 mg/dL (ref <150)
VLDL: 13 mg/dL (ref 0–40)

## 2019-05-11 MED ORDER — APIXABAN 5 MG PO TABS: 5.0000 mg | ORAL_TABLET | Freq: Two times a day (BID) | ORAL | 6 refills | Status: DC

## 2019-05-14 ENCOUNTER — Telehealth (HOSPITAL_COMMUNITY): Payer: Self-pay

## 2019-05-14 ENCOUNTER — Telehealth (HOSPITAL_COMMUNITY): Payer: Self-pay | Admitting: Pharmacy Technician

## 2019-05-14 NOTE — Telephone Encounter (Signed)
Per Kathlee Nations, pharmacy tech, she will provide assistance for medicine eliquis. She will reach out to patient.

## 2019-05-14 NOTE — Telephone Encounter (Signed)
Received notifciation that patient is in the donut hole and that Eliquis is $115 for a 30 day supply. Started an application with [employer] that I will take to the front check in desk.  Spoke with patient and his wife, they are going to come and sign the application tomorrow and bring his OOP expense report and last years taxes to go along with application. Application will be sent at that time.  Phone: [phone]  Fax: [phone]  Will continue to follow.  Charlann Boxer, CPhT

## 2019-05-14 NOTE — Telephone Encounter (Signed)
Pts son called and left vm that patient is in donut hole for medication Eliquis until the rest of the year.  It is currently 115$ a month.  He asking for an alternative that is cheaper.  Message forwarded pharmacy staff to assist with obtaining medicine for more manageable price.

## 2019-05-22 ENCOUNTER — Telehealth (HOSPITAL_COMMUNITY): Payer: Self-pay | Admitting: Licensed Clinical Social Worker

## 2019-05-22 NOTE — Telephone Encounter (Signed)
Clinic received notification from [facility] that patient does not currently qualify.  CSW called [facility] and was told pt is below copay requirement at this time.  Pt has to spend $696.18 before he qualifies and is currently $110 below that- they did state they could count patient wife's copay expenses for the year as well.  CSW called and spoke with pt wife to inform- she will get copay statement and bring to clinic to be submitted.  Jorge Ny, LCSW Clinical Social Worker [facility] Desk#: [phone] Cell#: [phone]

## 2019-05-25 DIAGNOSIS — Z87891 Personal history of nicotine dependence: Secondary | ICD-10-CM | POA: Diagnosis not present

## 2019-05-25 DIAGNOSIS — I4891 Unspecified atrial fibrillation: Secondary | ICD-10-CM | POA: Diagnosis not present

## 2019-05-25 DIAGNOSIS — G629 Polyneuropathy, unspecified: Secondary | ICD-10-CM | POA: Diagnosis not present

## 2019-05-25 DIAGNOSIS — Z85828 Personal history of other malignant neoplasm of skin: Secondary | ICD-10-CM | POA: Diagnosis not present

## 2019-05-25 DIAGNOSIS — I110 Hypertensive heart disease with heart failure: Secondary | ICD-10-CM | POA: Diagnosis not present

## 2019-05-25 DIAGNOSIS — G8929 Other chronic pain: Secondary | ICD-10-CM | POA: Diagnosis not present

## 2019-05-25 DIAGNOSIS — M1990 Unspecified osteoarthritis, unspecified site: Secondary | ICD-10-CM | POA: Diagnosis not present

## 2019-05-25 DIAGNOSIS — I509 Heart failure, unspecified: Secondary | ICD-10-CM | POA: Diagnosis not present

## 2019-05-25 DIAGNOSIS — Z7901 Long term (current) use of anticoagulants: Secondary | ICD-10-CM | POA: Diagnosis not present

## 2019-05-25 DIAGNOSIS — E785 Hyperlipidemia, unspecified: Secondary | ICD-10-CM | POA: Diagnosis not present

## 2019-06-01 NOTE — Telephone Encounter (Signed)
Received a denial letter for Dylan [name] from [employer]. Called to get clarification, the representative stated that he has spend $585.43 and needs to spend $110.75 more OOP before he can be approved. Called patient and left message asking him to call me back.   Will follow up.  Charlann Boxer, CPhT

## 2019-06-03 ENCOUNTER — Telehealth (HOSPITAL_COMMUNITY): Payer: Self-pay | Admitting: Pharmacy Technician

## 2019-06-03 NOTE — Telephone Encounter (Signed)
Spoke to patient's wife. She is going to bring in her OOP expense report from this year and we will send that to [employer].   Will follow up.  Charlann Boxer, CPhT

## 2019-06-05 NOTE — Telephone Encounter (Signed)
Sent over wife's OOP expense report to [employer].  Will follow up.  Charlann Boxer, CPhT

## 2019-06-11 NOTE — Telephone Encounter (Signed)
Advanced Heart Failure Patient Advocate Encounter   Patient was approved to receive Eliquis from [employer].  Patient ID: [id number] Effective dates: 06/11/2019 through 08/20/2019  Fax #: [phone].   [employer] patient assistance phone number for follow up is [phone].   Spoke with Mrs. Okubo, advised her to call me with any issues or if she has not heard from [facility] by Monday to set up his shipment.  Charlann Boxer, CPhT

## 2019-06-29 DIAGNOSIS — E1151 Type 2 diabetes mellitus with diabetic peripheral angiopathy without gangrene: Secondary | ICD-10-CM | POA: Diagnosis not present

## 2019-06-29 DIAGNOSIS — I110 Hypertensive heart disease with heart failure: Secondary | ICD-10-CM | POA: Diagnosis not present

## 2019-06-29 DIAGNOSIS — Z7901 Long term (current) use of anticoagulants: Secondary | ICD-10-CM | POA: Diagnosis not present

## 2019-06-29 DIAGNOSIS — M160 Bilateral primary osteoarthritis of hip: Secondary | ICD-10-CM | POA: Diagnosis not present

## 2019-06-29 DIAGNOSIS — I480 Paroxysmal atrial fibrillation: Secondary | ICD-10-CM | POA: Diagnosis not present

## 2019-06-29 DIAGNOSIS — I5032 Chronic diastolic (congestive) heart failure: Secondary | ICD-10-CM | POA: Diagnosis not present

## 2019-06-29 DIAGNOSIS — E7800 Pure hypercholesterolemia, unspecified: Secondary | ICD-10-CM | POA: Diagnosis not present

## 2019-06-29 DIAGNOSIS — Z950 Presence of cardiac pacemaker: Secondary | ICD-10-CM | POA: Diagnosis not present

## 2019-06-30 ENCOUNTER — Telehealth (HOSPITAL_COMMUNITY): Payer: Self-pay | Admitting: Pharmacy Technician

## 2019-06-30 NOTE — Telephone Encounter (Signed)
Spoke to patient's wife who received a re-enrollment packet from [employer]. She has decided to wait to re-apply. States that 2020 is the first year he has gone into the donut hole.  Advised her to let me know when she was ready to fill it out and I would assist her with that. Added patient to a-fib wait list through [facility].  Charlann Boxer, CPhT

## 2019-07-02 ENCOUNTER — Ambulatory Visit (INDEPENDENT_AMBULATORY_CARE_PROVIDER_SITE_OTHER): Payer: Medicare HMO | Admitting: *Deleted

## 2019-07-02 ENCOUNTER — Telehealth: Payer: Self-pay | Admitting: Internal Medicine

## 2019-07-02 DIAGNOSIS — I5031 Acute diastolic (congestive) heart failure: Secondary | ICD-10-CM | POA: Diagnosis not present

## 2019-07-02 DIAGNOSIS — I4891 Unspecified atrial fibrillation: Secondary | ICD-10-CM

## 2019-07-02 LAB — CUP PACEART REMOTE DEVICE CHECK
Battery Remaining Longevity: 123 mo
Battery Voltage: 3.01 V
Brady Statistic AP VP Percent: 9.41 %
Brady Statistic AP VS Percent: 89.81 %
Brady Statistic AS VP Percent: 0.01 %
Brady Statistic AS VS Percent: 0.77 %
Brady Statistic RA Percent Paced: 99.86 %
Brady Statistic RV Percent Paced: 9.42 %
Date Time Interrogation Session: 20201112160331
Implantable Lead Implant Date: 20190216
Implantable Lead Implant Date: 20190216
Implantable Lead Location: 753859
Implantable Lead Location: 753860
Implantable Lead Model: 5076
Implantable Lead Model: 5076
Implantable Pulse Generator Implant Date: 20190216
Lead Channel Impedance Value: 304 Ohm
Lead Channel Impedance Value: 323 Ohm
Lead Channel Impedance Value: 399 Ohm
Lead Channel Impedance Value: 437 Ohm
Lead Channel Pacing Threshold Amplitude: 0.875 V
Lead Channel Pacing Threshold Amplitude: 1.125 V
Lead Channel Pacing Threshold Pulse Width: 0.4 ms
Lead Channel Pacing Threshold Pulse Width: 0.4 ms
Lead Channel Sensing Intrinsic Amplitude: 2.125 mV
Lead Channel Sensing Intrinsic Amplitude: 2.125 mV
Lead Channel Sensing Intrinsic Amplitude: 2.5 mV
Lead Channel Sensing Intrinsic Amplitude: 2.5 mV
Lead Channel Setting Pacing Amplitude: 2.25 V
Lead Channel Setting Pacing Amplitude: 2.5 V
Lead Channel Setting Pacing Pulse Width: 0.4 ms
Lead Channel Setting Sensing Sensitivity: 1.2 mV

## 2019-07-02 NOTE — Telephone Encounter (Signed)
New Message:  Romie Minus, Daughter of the patient wanted to let the office know that the transmission was sent later than normal. She did not have her phone close to the patient for the app to send information. She also has not received a confirmation text yet, and is concerned something went wrong.  Please contact her.

## 2019-07-24 NOTE — Progress Notes (Signed)
Remote pacemaker transmission.   

## 2019-07-28 ENCOUNTER — Other Ambulatory Visit: Payer: Self-pay

## 2019-07-28 ENCOUNTER — Encounter (HOSPITAL_COMMUNITY): Payer: Self-pay

## 2019-07-28 ENCOUNTER — Inpatient Hospital Stay (HOSPITAL_COMMUNITY)
Admission: EM | Admit: 2019-07-28 | Discharge: 2019-08-02 | DRG: 177 | Disposition: A | Discharge status: Home / Self Care | Payer: Medicare HMO | Attending: Internal Medicine | Admitting: Internal Medicine

## 2019-07-28 ENCOUNTER — Emergency Department (HOSPITAL_COMMUNITY): Payer: Medicare HMO

## 2019-07-28 DIAGNOSIS — I110 Hypertensive heart disease with heart failure: Secondary | ICD-10-CM | POA: Diagnosis not present

## 2019-07-28 DIAGNOSIS — Z881 Allergy status to other antibiotic agents status: Secondary | ICD-10-CM | POA: Diagnosis not present

## 2019-07-28 DIAGNOSIS — E119 Type 2 diabetes mellitus without complications: Secondary | ICD-10-CM

## 2019-07-28 DIAGNOSIS — E871 Hypo-osmolality and hyponatremia: Secondary | ICD-10-CM | POA: Diagnosis present

## 2019-07-28 DIAGNOSIS — R17 Unspecified jaundice: Secondary | ICD-10-CM | POA: Diagnosis not present

## 2019-07-28 DIAGNOSIS — Z8249 Family history of ischemic heart disease and other diseases of the circulatory system: Secondary | ICD-10-CM

## 2019-07-28 DIAGNOSIS — J9601 Acute respiratory failure with hypoxia: Secondary | ICD-10-CM | POA: Diagnosis present

## 2019-07-28 DIAGNOSIS — I5032 Chronic diastolic (congestive) heart failure: Secondary | ICD-10-CM | POA: Diagnosis present

## 2019-07-28 DIAGNOSIS — E1165 Type 2 diabetes mellitus with hyperglycemia: Secondary | ICD-10-CM | POA: Diagnosis not present

## 2019-07-28 DIAGNOSIS — Z9049 Acquired absence of other specified parts of digestive tract: Secondary | ICD-10-CM | POA: Diagnosis not present

## 2019-07-28 DIAGNOSIS — R0600 Dyspnea, unspecified: Secondary | ICD-10-CM | POA: Diagnosis not present

## 2019-07-28 DIAGNOSIS — Z7289 Other problems related to lifestyle: Secondary | ICD-10-CM

## 2019-07-28 DIAGNOSIS — J1282 Pneumonia due to coronavirus disease 2019: Secondary | ICD-10-CM

## 2019-07-28 DIAGNOSIS — I4891 Unspecified atrial fibrillation: Secondary | ICD-10-CM | POA: Diagnosis present

## 2019-07-28 DIAGNOSIS — D72825 Bandemia: Secondary | ICD-10-CM | POA: Diagnosis not present

## 2019-07-28 DIAGNOSIS — Z7901 Long term (current) use of anticoagulants: Secondary | ICD-10-CM | POA: Diagnosis not present

## 2019-07-28 DIAGNOSIS — J1289 Other viral pneumonia: Secondary | ICD-10-CM

## 2019-07-28 DIAGNOSIS — Z20818 Contact with and (suspected) exposure to other bacterial communicable diseases: Secondary | ICD-10-CM | POA: Diagnosis not present

## 2019-07-28 DIAGNOSIS — E1151 Type 2 diabetes mellitus with diabetic peripheral angiopathy without gangrene: Secondary | ICD-10-CM | POA: Diagnosis not present

## 2019-07-28 DIAGNOSIS — E876 Hypokalemia: Secondary | ICD-10-CM | POA: Diagnosis present

## 2019-07-28 DIAGNOSIS — H9190 Unspecified hearing loss, unspecified ear: Secondary | ICD-10-CM | POA: Diagnosis present

## 2019-07-28 DIAGNOSIS — Z79899 Other long term (current) drug therapy: Secondary | ICD-10-CM | POA: Diagnosis not present

## 2019-07-28 DIAGNOSIS — Z66 Do not resuscitate: Secondary | ICD-10-CM | POA: Diagnosis not present

## 2019-07-28 DIAGNOSIS — R911 Solitary pulmonary nodule: Secondary | ICD-10-CM | POA: Diagnosis present

## 2019-07-28 DIAGNOSIS — I480 Paroxysmal atrial fibrillation: Secondary | ICD-10-CM | POA: Diagnosis not present

## 2019-07-28 DIAGNOSIS — E785 Hyperlipidemia, unspecified: Secondary | ICD-10-CM | POA: Diagnosis not present

## 2019-07-28 DIAGNOSIS — Z9181 History of falling: Secondary | ICD-10-CM | POA: Diagnosis not present

## 2019-07-28 DIAGNOSIS — R531 Weakness: Secondary | ICD-10-CM | POA: Diagnosis not present

## 2019-07-28 DIAGNOSIS — Z87891 Personal history of nicotine dependence: Secondary | ICD-10-CM

## 2019-07-28 DIAGNOSIS — Z950 Presence of cardiac pacemaker: Secondary | ICD-10-CM

## 2019-07-28 DIAGNOSIS — U071 COVID-19: Principal | ICD-10-CM | POA: Diagnosis present

## 2019-07-28 DIAGNOSIS — D696 Thrombocytopenia, unspecified: Secondary | ICD-10-CM | POA: Diagnosis not present

## 2019-07-28 DIAGNOSIS — J189 Pneumonia, unspecified organism: Secondary | ICD-10-CM | POA: Diagnosis not present

## 2019-07-28 LAB — BASIC METABOLIC PANEL
Anion gap: 13 (ref 5–15)
BUN: 14 mg/dL (ref 8–23)
CO2: 24 mmol/L (ref 22–32)
Calcium: 8.4 mg/dL — ABNORMAL LOW (ref 8.9–10.3)
Chloride: 95 mmol/L — ABNORMAL LOW (ref 98–111)
Creatinine, Ser: 1.01 mg/dL (ref 0.61–1.24)
GFR calc Af Amer: >60 mL/min (ref >60)
GFR calc non Af Amer: >60 mL/min (ref >60)
Glucose, Bld: 92 mg/dL (ref 70–99)
Potassium: 3.7 mmol/L (ref 3.5–5.1)
Sodium: 132 mmol/L — ABNORMAL LOW (ref 135–145)

## 2019-07-28 LAB — URINALYSIS, ROUTINE W REFLEX MICROSCOPIC
Bacteria, UA: NONE SEEN
Bilirubin Urine: NEGATIVE
Glucose, UA: NEGATIVE mg/dL
Ketones, ur: NEGATIVE mg/dL
Leukocytes,Ua: NEGATIVE
Nitrite: NEGATIVE
Protein, ur: NEGATIVE mg/dL
Specific Gravity, Urine: 1.011 (ref 1.005–1.030)
pH: 5 (ref 5.0–8.0)

## 2019-07-28 LAB — CBC WITH DIFFERENTIAL/PLATELET
Abs Immature Granulocytes: 0.01 10*3/uL (ref 0.00–0.07)
Basophils Absolute: 0 10*3/uL (ref 0.0–0.1)
Basophils Relative: 0 %
Eosinophils Absolute: 0 10*3/uL (ref 0.0–0.5)
Eosinophils Relative: 0 %
HCT: 41 % (ref 39.0–52.0)
Hemoglobin: 13.5 g/dL (ref 13.0–17.0)
Immature Granulocytes: 0 %
Lymphocytes Relative: 22 %
Lymphs Abs: 1.1 10*3/uL (ref 0.7–4.0)
MCH: 31.9 pg (ref 26.0–34.0)
MCHC: 32.9 g/dL (ref 30.0–36.0)
MCV: 96.9 fL (ref 80.0–100.0)
Monocytes Absolute: 0.4 10*3/uL (ref 0.1–1.0)
Monocytes Relative: 9 %
Neutro Abs: 3.5 10*3/uL (ref 1.7–7.7)
Neutrophils Relative %: 69 %
Platelets: 104 10*3/uL — ABNORMAL LOW (ref 150–400)
RBC: 4.23 MIL/uL (ref 4.22–5.81)
RDW: 13.1 % (ref 11.5–15.5)
WBC: 5.1 10*3/uL (ref 4.0–10.5)
nRBC: 0 % (ref 0.0–0.2)

## 2019-07-28 LAB — POC SARS CORONAVIRUS 2 AG -  ED: SARS Coronavirus 2 Ag: POSITIVE — AB

## 2019-07-28 MED ORDER — ONDANSETRON HCL 4 MG/2ML IJ SOLN: 4.0000 mg | Freq: Four times a day (QID) | INTRAMUSCULAR | Status: DC | PRN

## 2019-07-28 MED ORDER — CARVEDILOL 3.125 MG PO TABS
3.1250 mg | ORAL_TABLET | Freq: Two times a day (BID) | ORAL | Status: DC
  Administered 2019-07-28 – 2019-08-02 (×10): 3.125 mg via ORAL
  Filled 2019-07-28 (×10): qty 1

## 2019-07-28 MED ORDER — AMIODARONE HCL 200 MG PO TABS
200.0000 mg | ORAL_TABLET | Freq: Every day | ORAL | Status: DC
  Administered 2019-07-29 – 2019-08-02 (×5): 200 mg via ORAL
  Filled 2019-07-28 (×4): qty 1
  Filled 2019-07-28: qty 2

## 2019-07-28 MED ORDER — DEXAMETHASONE SODIUM PHOSPHATE 10 MG/ML IJ SOLN
6.0000 mg | INTRAMUSCULAR | Status: DC
  Administered 2019-07-28 – 2019-08-01 (×5): 6 mg via INTRAVENOUS
  Filled 2019-07-28 (×5): qty 1

## 2019-07-28 MED ORDER — SODIUM CHLORIDE 0.9 % IV SOLN
200.0000 mg | Freq: Once | INTRAVENOUS | Status: AC
  Administered 2019-07-28: 200 mg via INTRAVENOUS
  Filled 2019-07-28: qty 200

## 2019-07-28 MED ORDER — ROSUVASTATIN CALCIUM 5 MG PO TABS
10.0000 mg | ORAL_TABLET | Freq: Every day | ORAL | Status: DC
  Administered 2019-07-28 – 2019-08-01 (×5): 10 mg via ORAL
  Filled 2019-07-28 (×2): qty 1
  Filled 2019-07-28: qty 2
  Filled 2019-07-28: qty 1
  Filled 2019-07-28: qty 2

## 2019-07-28 MED ORDER — APIXABAN 5 MG PO TABS
5.0000 mg | ORAL_TABLET | Freq: Two times a day (BID) | ORAL | Status: DC
  Administered 2019-07-28 – 2019-08-02 (×10): 5 mg via ORAL
  Filled 2019-07-28 (×10): qty 1

## 2019-07-28 MED ORDER — SODIUM CHLORIDE 0.9 % IV SOLN
100.0000 mg | Freq: Every day | INTRAVENOUS | Status: AC
  Administered 2019-07-29 – 2019-08-01 (×4): 100 mg via INTRAVENOUS
  Filled 2019-07-28: qty 100
  Filled 2019-07-28: qty 20
  Filled 2019-07-28 (×2): qty 100

## 2019-07-28 MED ORDER — ONDANSETRON HCL 4 MG PO TABS: 4.0000 mg | ORAL_TABLET | Freq: Four times a day (QID) | ORAL | Status: DC | PRN

## 2019-07-28 MED ORDER — GUAIFENESIN 100 MG/5ML PO SOLN
5.0000 mL | ORAL | Status: DC | PRN
  Administered 2019-07-28: 100 mg via ORAL
  Filled 2019-07-28: qty 10

## 2019-07-28 MED ORDER — ACETAMINOPHEN 325 MG PO TABS
650.0000 mg | ORAL_TABLET | Freq: Four times a day (QID) | ORAL | Status: DC | PRN
  Administered 2019-07-29: 650 mg via ORAL
  Filled 2019-07-28: qty 2

## 2019-07-28 MED ORDER — ACETAMINOPHEN 325 MG PO TABS: 650.0000 mg | ORAL_TABLET | Freq: Four times a day (QID) | ORAL | Status: DC | PRN

## 2019-07-28 NOTE — ED Notes (Addendum)
Pt was ambulated and O2 was 90% and heart rate was 87, Pt did not complain of weakness, dizziness, or drowsy. Pt just coughing a lot during ambulation.

## 2019-07-28 NOTE — ED Notes (Signed)
Pt O2 increased to 6L by nasal cannula, O2 sats 91%.  Admitting provider MD Broadus John paged.  Awaiting response.

## 2019-07-28 NOTE — ED Notes (Signed)
Pt placed on 3L O2 by [place] d/t O2 sats decreasing to 89% on RA.  Will continue to monitor.

## 2019-07-28 NOTE — H&P (Addendum)
History and Physical    Dylan [name] [id number] DOB: June 19, 1926 DOA: 07/28/2019  Referring MD/NP/PA: EDP PCP: Dr. Osborne Casco Patient coming from: Home  Chief Complaint: Shortness of breath, Covid positive  HPI: Dylan [name] is a 85 y.o. male with medical history significant for paroxysmal atrial fibrillation on amiodarone and Eliquis, chronic diastolic CHF, diet-controlled diabetes presented to the ED with the above complaints.  Patient reports that he developed symptoms of cough and shortness of breath over the past week as a result of which he went to Dr. Loren Racer office this morning, they did a rapid Covid antigen on him which was positive (I called Dr. Loren Racer office at [facility] to confirm this), he was also noted to have borderline low O2 sats and subsequently sent to the emergency room for evaluation and treatment ED Course: O2 sats at rest were 92 to 93% on room air, rest of his vital signs were stable labs were relatively unremarkable, chest x-ray noted subtle changes of multilobar pneumonia  Review of Systems: As per HPI otherwise 14 point review of systems negative.   Past Medical History:  Diagnosis Date  . Hyperglycemia   . Hyperlipidemia   . Hypertension   . Lung nodule     Past Surgical History:  Procedure Laterality Date  . APPENDECTOMY  1956  . BACK SURGERY  2008  . CARDIAC CATHETERIZATION  1995   negative results  . CARDIOVERSION N/A 08/05/2017   Procedure: CARDIOVERSION;  Surgeon: Larey Dresser, MD;  Location: [facility] ENDOSCOPY;  Service: Cardiovascular;  Laterality: N/A;  . CARDIOVERSION N/A 09/12/2017   Procedure: CARDIOVERSION;  Surgeon: Larey Dresser, MD;  Location: [facility] ENDOSCOPY;  Service: Cardiovascular;  Laterality: N/A;  . COLON SURGERY  07/09/12  . [place]  . HOT HEMOSTASIS  01/08/2012   Procedure: HOT HEMOSTASIS (ARGON PLASMA COAGULATION/BICAP);  Surgeon: Arta Silence, MD;  Location: [facility] ENDOSCOPY;  Service:  Endoscopy;  Laterality: N/A;  . LAPAROSCOPIC ILEOCECECTOMY  07/09/2012   Procedure: LAPAROSCOPIC ILEOCECECTOMY;  Surgeon: Edward Jolly, MD;  Location: [facility] ORS;  Service: General;  Laterality: N/A;  Laparoscopic Ileocecectomy  . PACEMAKER IMPLANT N/A 10/05/2017   Procedure: PACEMAKER IMPLANT;  Surgeon: Deboraha Sprang, MD;  Location: [place] CV LAB;  Service: Cardiovascular;  Laterality: N/A;  . REPLACEMENT TOTAL KNEE  2010  . TEE WITHOUT CARDIOVERSION N/A 08/05/2017   Procedure: TRANSESOPHAGEAL ECHOCARDIOGRAM (TEE);  Surgeon: Larey Dresser, MD;  Location: [facility] ENDOSCOPY;  Service: Cardiovascular;  Laterality: N/A;  . TEE WITHOUT CARDIOVERSION N/A 09/12/2017   Procedure: TRANSESOPHAGEAL ECHOCARDIOGRAM (TEE);  Surgeon: Larey Dresser, MD;  Location: [facility] ENDOSCOPY;  Service: Cardiovascular;  Laterality: N/A;     reports that he quit smoking about 27 years ago. His smoking use included cigarettes. He smoked 1.00 pack per day. He has never used smokeless tobacco. He reports current alcohol use of about 2.0 standard drinks of alcohol per week. He reports that he does not use drugs.  Allergies  Allergen Reactions  . Keflex [Cephalexin] Hives and Other (See Comments)    Occurred in the 84's    Family History  Problem Relation Age of Onset  . Cancer - Other Mother   . CVA Father   . Heart attack Brother   . Heart attack Maternal Grandmother      Prior to Admission medications   Medication Sig Start Date End Date Taking? Authorizing Provider  acetaminophen (TYLENOL) 325 MG tablet Take 650 mg by mouth every  6 (six) hours as needed for mild pain or headache.    Yes [provider]  amiodarone (PACERONE) 200 MG tablet Take 1 tablet by mouth once daily Patient taking differently: Take 200 mg by mouth daily.  12/09/18  Yes Larey Dresser, MD  apixaban (ELIQUIS) 5 MG TABS tablet Take 1 tablet (5 mg total) by mouth 2 (two) times daily. 05/11/19  Yes Larey Dresser, MD   carvedilol (COREG) 3.125 MG tablet Take 1 tablet (3.125 mg total) by mouth 2 (two) times daily with a meal. 09/17/17  Yes Larey Dresser, MD  Cholecalciferol (VITAMIN D) 2000 units CAPS Take 2,000 Units by mouth daily.    Yes [provider]  furosemide (LASIX) 20 MG tablet Take 1 tablet (20 mg total) by mouth daily. 10/14/17  Yes Larey Dresser, MD  Omega-3 Fatty Acids (FISH OIL) 1200 MG CAPS Take 1,200 mg by mouth 2 (two) times daily.    Yes [provider]  rosuvastatin (CRESTOR) 10 MG tablet Take 1 tablet (10 mg total) by mouth daily. 03/13/19 07/28/19 Yes Larey Dresser, MD  vitamin B-12 (CYANOCOBALAMIN) 1000 MCG tablet Take 1,000 mcg by mouth daily.   Yes [provider]  VITAMIN E PO Take 1 tablet by mouth daily.   Yes [provider]    Physical Exam: Vitals:   07/28/19 1233 07/28/19 1235 07/28/19 1400 07/28/19 1530  BP: (!) 110/95  (!) 165/65 131/75  Pulse: 81  63 87  Resp: 20  16 (!) 21  Temp:  99.2 F (37.3 C)    TempSrc:  Oral    SpO2: 92%  92% 94%  Weight: 113.4 kg     Height: 6' 1.5" (1.867 m)         Constitutional: Elderly male sitting up in bed, extremely hard of hearing Vitals:   07/28/19 1233 07/28/19 1235 07/28/19 1400 07/28/19 1530  BP: (!) 110/95  (!) 165/65 131/75  Pulse: 81  63 87  Resp: 20  16 (!) 21  Temp:  99.2 F (37.3 C)    TempSrc:  Oral    SpO2: 92%  92% 94%  Weight: 113.4 kg     Height: 6' 1.5" (1.867 m)      Eyes: PERRL, lids and conjunctivae normal ENMT: Mucous membranes are moist.  Neck: normal, supple Respiratory: Distant breath sounds, no respiratory distress, not using accessory muscles Cardiovascular: S1-S2, regular rate and rhythm  abdomen: soft, non tender, Bowel sounds positive.  Musculoskeletal: No joint deformity upper and lower extremities. Ext: No edema Skin: no rashes, lesions, ulcers.  Neurologic: Moves all extremities, no localizing signs Psychiatric: Normal judgment and insight.  Alert and oriented x 3. Normal mood.   Labs on Admission: I have personally reviewed following labs and imaging studies  CBC: Recent Labs  Lab 07/28/19 1244  WBC 5.1  NEUTROABS 3.5  HGB 13.5  HCT 41.0  MCV 96.9  PLT 123456*   Basic Metabolic Panel: Recent Labs  Lab 07/28/19 1244  NA 132*  K 3.7  CL 95*  CO2 24  GLUCOSE 92  BUN 14  CREATININE 1.01  CALCIUM 8.4*   GFR: Estimated Creatinine Clearance: 63.3 mL/min (by C-G formula based on SCr of 1.01 mg/dL). Liver Function Tests: No results for input(s): AST, ALT, ALKPHOS, BILITOT, PROT, ALBUMIN in the last 168 hours. No results for input(s): LIPASE, AMYLASE in the last 168 hours. No results for input(s): AMMONIA in the last 168 hours. Coagulation Profile: No results  for input(s): INR, PROTIME in the last 168 hours. Cardiac Enzymes: No results for input(s): CKTOTAL, CKMB, CKMBINDEX, TROPONINI in the last 168 hours. BNP (last 3 results) No results for input(s): PROBNP in the last 8760 hours. HbA1C: No results for input(s): HGBA1C in the last 72 hours. CBG: No results for input(s): GLUCAP in the last 168 hours. Lipid Profile: No results for input(s): CHOL, HDL, LDLCALC, TRIG, CHOLHDL, LDLDIRECT in the last 72 hours. Thyroid Function Tests: No results for input(s): TSH, T4TOTAL, FREET4, T3FREE, THYROIDAB in the last 72 hours. Anemia Panel: No results for input(s): VITAMINB12, FOLATE, FERRITIN, TIBC, IRON, RETICCTPCT in the last 72 hours. Urine analysis:    Component Value Date/Time   COLORURINE YELLOW 07/28/2019 [place] 07/28/2019 1244   LABSPEC 1.011 07/28/2019 1244   PHURINE 5.0 07/28/2019 1244   GLUCOSEU NEGATIVE 07/28/2019 1244   HGBUR SMALL (A) 07/28/2019 1244   BILIRUBINUR NEGATIVE 07/28/2019 1244   KETONESUR NEGATIVE 07/28/2019 1244   PROTEINUR NEGATIVE 07/28/2019 1244   UROBILINOGEN 1.0 11/22/2009 1423   NITRITE NEGATIVE 07/28/2019 1244   LEUKOCYTESUR NEGATIVE 07/28/2019 1244   Sepsis  Labs: @LABRCNTIP (procalcitonin:4,lacticidven:4) )No results found for this or any previous visit (from the past 240 hour(s)).   Radiological Exams on Admission: Dg Chest Portable 1 View  Result Date: 07/28/2019 CLINICAL DATA:  85 year old male with history of dyspnea. COVID-19 positive. EXAM: PORTABLE CHEST 1 VIEW COMPARISON:  Chest x-ray 10/14/2017. FINDINGS: Patchy areas of interstitial prominence an ill-defined opacities noted throughout the mid to lower lungs bilaterally, concerning for multilobar pneumonia. Lung volumes are low. No pleural effusions. No evidence of pulmonary edema. Mild cardiomegaly. Upper mediastinal contours are within normal limits. Aortic atherosclerosis. Left-sided pacemaker device in place with lead tips projecting over the expected location of the right atrium and right ventricle. IMPRESSION: 1. Subtle changes compatible with multilobar bilateral pneumonia most evident throughout the mid to lower lungs bilaterally. 2. Mild cardiomegaly. 3. Aortic atherosclerosis. Electronically Signed   By: Vinnie Langton M.D.   On: 07/28/2019 13:58    EKG: Independently reviewed.  pending  Assessment/Plan  COVID-19 pneumonia -Chest x-ray shows subtle multilobar pneumonia, requiring supplemental oxygen at this time -Start IV remdesivir and Decadron -Supportive care, monitor inflammatory markers daily -Continue apixaban, does not need additional DVT prophylaxis  Type 2 diabetes mellitus without complication (HCC) -Diet controlled, monitor CBGs  Chronic diastolic CHF (congestive heart failure) (HCC) -Clinically appears euvolemic, hold Lasix today  Paroxysmal atrial fibrillation -In sinus rhythm at this time, continue carvedilol, amiodarone and apixaban  DVT prophylaxis: Apixaban Code Status: DNR-discussed with the patient, per patient's wishes Family Communication: No family at bedside Disposition Plan: Home in 48 hours if stable Consults called: None Admission status:  Inpatient  Domenic Polite MD [facility]   07/28/2019, 4:40 PM

## 2019-07-28 NOTE — ED Triage Notes (Signed)
Pt from home.  Reports being dx with COVID 19 today.  Genrealized weakness and cough x1 week.  Progressively worsening.

## 2019-07-28 NOTE — ED Provider Notes (Signed)
[place] DEPT Provider Note   CSN: [id number] Arrival date & time: 07/28/19  1159     History   Chief Complaint Chief Complaint  Patient presents with  . covid positive  . Weakness  . Cough    HPI Dylan [name] is a 85 y.o. male.     HPI   85 year old male with cough and generalized weakness.  He is unclear as to exact onset of symptoms but estimates several days to possibly up to a week.  He states that he feels "blah."  He feels like as if he has no energy.  He has been coughing a lot and "now my diaphragm hurts."  Denies any significant pain when not coughing.  Denies any nausea, vomiting or diarrhea.  He does not feel like he has had a fever. Went to [employer] office today and says he tested positive for COVID.   Patient Active Problem List   Diagnosis Date Noted  . Periorbital hematoma of left eye 10/15/2017  . Fall 10/15/2017  . HCAP (healthcare-associated pneumonia) 10/14/2017  . Bradycardia 10/03/2017  . Acute CHF (congestive heart failure) ([place]) 08/03/2017  . Atrial fibrillation ([place]) 07/14/2017  . Diastolic dysfunction with acute on chronic heart failure ([place]) 07/14/2017  . DOE (dyspnea on exertion) 08/24/2014  . Benign essential HTN 08/24/2014  . DM II (diabetes mellitus, type II), controlled ([place]) 08/24/2014  . Dyslipidemia 08/24/2014    Past Surgical History:  Procedure Laterality Date  . APPENDECTOMY  1956  . BACK SURGERY  2008  . CARDIAC CATHETERIZATION  1995   negative results  . CARDIOVERSION N/A 08/05/2017   Procedure: CARDIOVERSION;  Surgeon: Larey Dresser, MD;  Location: [facility] ENDOSCOPY;  Service: Cardiovascular;  Laterality: N/A;  . CARDIOVERSION N/A 09/12/2017   Procedure: CARDIOVERSION;  Surgeon: Larey Dresser, MD;  Location: [facility] ENDOSCOPY;  Service: Cardiovascular;  Laterality: N/A;  . COLON SURGERY  07/09/12  . [place]  . HOT HEMOSTASIS  01/08/2012   Procedure: HOT HEMOSTASIS (ARGON PLASMA  COAGULATION/BICAP);  Surgeon: Arta Silence, MD;  Location: [facility] ENDOSCOPY;  Service: Endoscopy;  Laterality: N/A;  . LAPAROSCOPIC ILEOCECECTOMY  07/09/2012   Procedure: LAPAROSCOPIC ILEOCECECTOMY;  Surgeon: Edward Jolly, MD;  Location: [facility] ORS;  Service: General;  Laterality: N/A;  Laparoscopic Ileocecectomy  . PACEMAKER IMPLANT N/A 10/05/2017   Procedure: PACEMAKER IMPLANT;  Surgeon: Deboraha Sprang, MD;  Location: [place] CV LAB;  Service: Cardiovascular;  Laterality: N/A;  . REPLACEMENT TOTAL KNEE  2010  . TEE WITHOUT CARDIOVERSION N/A 08/05/2017   Procedure: TRANSESOPHAGEAL ECHOCARDIOGRAM (TEE);  Surgeon: Larey Dresser, MD;  Location: [facility] ENDOSCOPY;  Service: Cardiovascular;  Laterality: N/A;  . TEE WITHOUT CARDIOVERSION N/A 09/12/2017   Procedure: TRANSESOPHAGEAL ECHOCARDIOGRAM (TEE);  Surgeon: Larey Dresser, MD;  Location: [facility] ENDOSCOPY;  Service: Cardiovascular;  Laterality: N/A;     Home Medications    Prior to Admission medications   Medication Sig Start Date End Date Taking? Authorizing Provider  acetaminophen (TYLENOL) 325 MG tablet Take 650 mg by mouth every 6 (six) hours as needed.    [provider]  amiodarone (PACERONE) 200 MG tablet Take 1 tablet by mouth once daily 12/09/18   Larey Dresser, MD  apixaban (ELIQUIS) 5 MG TABS tablet Take 1 tablet (5 mg total) by mouth 2 (two) times daily. 05/11/19   Larey Dresser, MD  carvedilol (COREG) 3.125 MG tablet Take 1 tablet (3.125 mg total) by mouth 2 (two)  times daily with a meal. 09/17/17   Larey Dresser, MD  Cholecalciferol (VITAMIN D) 2000 units CAPS Take 2,000 Units by mouth daily.     [provider]  furosemide (LASIX) 20 MG tablet Take 1 tablet (20 mg total) by mouth daily. 10/14/17   Larey Dresser, MD  Omega-3 Fatty Acids (FISH OIL) 1200 MG CAPS Take 1,200 mg by mouth 2 (two) times daily.     [provider]  rosuvastatin (CRESTOR) 10 MG tablet Take 1 tablet (10 mg total) by mouth  daily. 03/13/19 06/11/19  Larey Dresser, MD  vitamin B-12 (CYANOCOBALAMIN) 1000 MCG tablet Take 1,000 mcg by mouth daily.    [provider]   Family History Family History  Problem Relation Age of Onset  . Cancer - Other Mother   . CVA Father   . Heart attack Brother   . Heart attack Maternal Grandmother    Social History Social History   Tobacco Use  . Smoking status: Former Smoker    Packs/day: 1.00    Types: Cigarettes    Quit date: 01/07/1992    Years since quitting: 27.5  . Smokeless tobacco: Never Used  Substance Use Topics  . Alcohol use: Yes    Alcohol/week: 2.0 standard drinks    Types: 2 Glasses of wine per week  . Drug use: No    Allergies   Keflex [cephalexin]  Review of Systems Review of Systems  All systems reviewed and negative, other than as noted in HPI.  Physical Exam Updated Vital Signs BP (!) 110/95 (BP Location: Left Arm)   Pulse 81   Temp 99.2 F (37.3 C) (Oral)   Resp 20   Ht 6' 1.5" (1.867 m)   Wt 113.4 kg   SpO2 92%   BMI 32.54 kg/m   Physical Exam Vitals signs and nursing note reviewed.  Constitutional:      General: He is not in acute distress.    Appearance: He is well-developed.  HENT:     Head: Normocephalic and atraumatic.  Eyes:     General:        Right eye: No discharge.        Left eye: No discharge.     Conjunctiva/sclera: Conjunctivae normal.  Neck:     Musculoskeletal: Neck supple.  Cardiovascular:     Rate and Rhythm: Normal rate and regular rhythm.     Heart sounds: Normal heart sounds. No murmur. No friction rub. No gallop.   Pulmonary:     Effort: Pulmonary effort is normal. No respiratory distress.     Breath sounds: Normal breath sounds.  Abdominal:     General: There is no distension.     Palpations: Abdomen is soft.     Tenderness: There is no abdominal tenderness.  Musculoskeletal:        General: No tenderness.  Skin:    General: Skin is warm and dry.  Neurological:     Mental  Status: He is alert.  Psychiatric:        Behavior: Behavior normal.        Thought Content: Thought content normal.      ED Treatments / Results  Labs (all labs ordered are listed, but only abnormal results are displayed) Labs Reviewed  CBC WITH DIFFERENTIAL/PLATELET - Abnormal; Notable for the following components:      Result Value   Platelets 104 (*)    All other components within normal limits  BASIC METABOLIC PANEL -  Abnormal; Notable for the following components:   Sodium 132 (*)    Chloride 95 (*)    Calcium 8.4 (*)    All other components within normal limits  URINALYSIS, ROUTINE W REFLEX MICROSCOPIC - Abnormal; Notable for the following components:   Hgb urine dipstick SMALL (*)    All other components within normal limits  COMPREHENSIVE METABOLIC PANEL - Abnormal; Notable for the following components:   Potassium 3.4 (*)    Glucose, Bld 189 (*)    Calcium 8.5 (*)    Total Protein 6.4 (*)    Albumin 3.4 (*)    Total Bilirubin 2.0 (*)    All other components within normal limits  CBC WITH DIFFERENTIAL/PLATELET - Abnormal; Notable for the following components:   RBC 4.11 (*)    Platelets 89 (*)    Lymphs Abs 0.6 (*)    All other components within normal limits  C-REACTIVE PROTEIN - Abnormal; Notable for the following components:   CRP 8.3 (*)    All other components within normal limits  D-DIMER, QUANTITATIVE (NOT AT [facility]) - Abnormal; Notable for the following components:   D-Dimer, Quant 1.03 (*)    All other components within normal limits  FERRITIN - Abnormal; Notable for the following components:   Ferritin 373 (*)    All other components within normal limits  POC SARS CORONAVIRUS 2 AG -  ED - Abnormal; Notable for the following components:   SARS Coronavirus 2 Ag POSITIVE (*)    All other components within normal limits    EKG None   Radiology Dg Chest Portable 1 View  Result Date: 07/28/2019 CLINICAL DATA:  85 year old male with history of  dyspnea. COVID-19 positive. EXAM: PORTABLE CHEST 1 VIEW COMPARISON:  Chest x-ray 10/14/2017. FINDINGS: Patchy areas of interstitial prominence an ill-defined opacities noted throughout the mid to lower lungs bilaterally, concerning for multilobar pneumonia. Lung volumes are low. No pleural effusions. No evidence of pulmonary edema. Mild cardiomegaly. Upper mediastinal contours are within normal limits. Aortic atherosclerosis. Left-sided pacemaker device in place with lead tips projecting over the expected location of the right atrium and right ventricle. IMPRESSION: 1. Subtle changes compatible with multilobar bilateral pneumonia most evident throughout the mid to lower lungs bilaterally. 2. Mild cardiomegaly. 3. Aortic atherosclerosis. Electronically Signed   By: Vinnie Langton M.D.   On: 07/28/2019 13:58    Procedures Procedures (including critical care time)  Medications Ordered in ED Medications - No data to display   Initial Impression / Assessment and Plan / ED Course  I have reviewed the triage vital signs and the nursing notes.  Pertinent labs & imaging results that were available during my care of the patient were reviewed by me and considered in my medical decision making (see chart for details).       89yM with fatigue. Reportedly tested positive for COVID today. Borderline hypoxemia 90-92%. With his advanced age and underlying medical problems I would lean towards admission.   Final Clinical Impressions(s) / ED Diagnoses   Final diagnoses:  COVID-19 virus infection    ED Discharge Orders    None       Virgel Manifold, MD 07/29/19 [phone]

## 2019-07-28 NOTE — ED Notes (Signed)
Pt was

## 2019-07-29 DIAGNOSIS — J9601 Acute respiratory failure with hypoxia: Secondary | ICD-10-CM

## 2019-07-29 DIAGNOSIS — E871 Hypo-osmolality and hyponatremia: Secondary | ICD-10-CM

## 2019-07-29 DIAGNOSIS — I480 Paroxysmal atrial fibrillation: Secondary | ICD-10-CM

## 2019-07-29 DIAGNOSIS — E1165 Type 2 diabetes mellitus with hyperglycemia: Secondary | ICD-10-CM

## 2019-07-29 DIAGNOSIS — E876 Hypokalemia: Secondary | ICD-10-CM

## 2019-07-29 LAB — COMPREHENSIVE METABOLIC PANEL
ALT: 27 U/L (ref 0–44)
AST: 27 U/L (ref 15–41)
Albumin: 3.4 g/dL — ABNORMAL LOW (ref 3.5–5.0)
Alkaline Phosphatase: 61 U/L (ref 38–126)
Anion gap: 11 (ref 5–15)
BUN: 12 mg/dL (ref 8–23)
CO2: 24 mmol/L (ref 22–32)
Calcium: 8.5 mg/dL — ABNORMAL LOW (ref 8.9–10.3)
Chloride: 100 mmol/L (ref 98–111)
Creatinine, Ser: 0.82 mg/dL (ref 0.61–1.24)
GFR calc Af Amer: >60 mL/min (ref >60)
GFR calc non Af Amer: >60 mL/min (ref >60)
Glucose, Bld: 189 mg/dL — ABNORMAL HIGH (ref 70–99)
Potassium: 3.4 mmol/L — ABNORMAL LOW (ref 3.5–5.1)
Sodium: 135 mmol/L (ref 135–145)
Total Bilirubin: 2 mg/dL — ABNORMAL HIGH (ref 0.3–1.2)
Total Protein: 6.4 g/dL — ABNORMAL LOW (ref 6.5–8.1)

## 2019-07-29 LAB — CBC WITH DIFFERENTIAL/PLATELET
Abs Immature Granulocytes: 0.01 10*3/uL (ref 0.00–0.07)
Basophils Absolute: 0 10*3/uL (ref 0.0–0.1)
Basophils Relative: 0 %
Eosinophils Absolute: 0 10*3/uL (ref 0.0–0.5)
Eosinophils Relative: 0 %
HCT: 39.5 % (ref 39.0–52.0)
Hemoglobin: 13.2 g/dL (ref 13.0–17.0)
Immature Granulocytes: 0 %
Lymphocytes Relative: 11 %
Lymphs Abs: 0.6 10*3/uL — ABNORMAL LOW (ref 0.7–4.0)
MCH: 32.1 pg (ref 26.0–34.0)
MCHC: 33.4 g/dL (ref 30.0–36.0)
MCV: 96.1 fL (ref 80.0–100.0)
Monocytes Absolute: 0.2 10*3/uL (ref 0.1–1.0)
Monocytes Relative: 4 %
Neutro Abs: 4.4 10*3/uL (ref 1.7–7.7)
Neutrophils Relative %: 85 %
Platelets: 89 10*3/uL — ABNORMAL LOW (ref 150–400)
RBC: 4.11 MIL/uL — ABNORMAL LOW (ref 4.22–5.81)
RDW: 13 % (ref 11.5–15.5)
WBC: 5.2 10*3/uL (ref 4.0–10.5)
nRBC: 0 % (ref 0.0–0.2)

## 2019-07-29 LAB — FERRITIN: Ferritin: 373 ng/mL — ABNORMAL HIGH (ref 24–336)

## 2019-07-29 LAB — GLUCOSE, CAPILLARY
Glucose-Capillary: 152 mg/dL — ABNORMAL HIGH (ref 70–99)
Glucose-Capillary: 149 mg/dL — ABNORMAL HIGH (ref 70–99)

## 2019-07-29 LAB — C-REACTIVE PROTEIN: CRP: 8.3 mg/dL — ABNORMAL HIGH (ref <1.0)

## 2019-07-29 LAB — D-DIMER, QUANTITATIVE: D-Dimer, Quant: 1.03 ug/mL-FEU — ABNORMAL HIGH (ref 0.00–0.50)

## 2019-07-29 LAB — HEMOGLOBIN A1C
Hgb A1c MFr Bld: 5.9 % — ABNORMAL HIGH (ref 4.8–5.6)
Mean Plasma Glucose: 122.63 mg/dL

## 2019-07-29 MED ORDER — DM-GUAIFENESIN ER 30-600 MG PO TB12
1.0000 | ORAL_TABLET | Freq: Two times a day (BID) | ORAL | Status: DC
  Administered 2019-07-29 – 2019-07-31 (×4): 1 via ORAL
  Filled 2019-07-29 (×4): qty 1

## 2019-07-29 MED ORDER — POTASSIUM CHLORIDE CRYS ER 20 MEQ PO TBCR
40.0000 meq | EXTENDED_RELEASE_TABLET | ORAL | Status: AC
  Administered 2019-07-29 (×2): 40 meq via ORAL
  Filled 2019-07-29 (×2): qty 2

## 2019-07-29 MED ORDER — INSULIN ASPART 100 UNIT/ML ~~LOC~~ SOLN
0.0000 [IU] | Freq: Three times a day (TID) | SUBCUTANEOUS | Status: DC
  Administered 2019-07-29: 3 [IU] via SUBCUTANEOUS
  Administered 2019-07-30: 2 [IU] via SUBCUTANEOUS
  Administered 2019-07-30 (×2): 3 [IU] via SUBCUTANEOUS
  Administered 2019-07-31 – 2019-08-01 (×3): 2 [IU] via SUBCUTANEOUS
  Administered 2019-08-01 – 2019-08-02 (×3): 3 [IU] via SUBCUTANEOUS

## 2019-07-29 MED ORDER — INSULIN ASPART 100 UNIT/ML ~~LOC~~ SOLN: 0.0000 [IU] | Freq: Every day | SUBCUTANEOUS | Status: DC

## 2019-07-29 NOTE — Progress Notes (Signed)
PROGRESS NOTE  Dylan [name] [id number] DOB: 1925/09/16   PCP: Haywood Pao, MD  Patient is from: Home  DOA: 07/28/2019 LOS: 1  Brief Narrative / Interim history: 85 year old male with history of PAF/bradycardia with PPM, diastolic CHF, diet-controlled DM-2, HLD, HTN and lung nodule presenting with shortness of breath, cough and positive Covid test at PCP office on the day of admission.  In ED, saturating in low 90s on RA but required up to 6 L to maintain saturation in low 90s later.  HDS.  CXR with septal changes of multilobar pneumonia.  Admitted and started on remdesivir and Decadron.  Subjective: No major events overnight of this morning.  He was on 6 L early this morning but took himself off oxygen now saturating at 96% on room air.  Reports hacking cough but denies dyspnea, chest pain, nausea, vomiting or abdominal pain.   Objective: Vitals:   07/28/19 2202 07/29/19 0549 07/29/19 0929 07/29/19 1244  BP: (!) 146/73 138/73  (!) 111/59  Pulse: 60 66  64  Resp: 20 18  17   Temp: 98.6 F (37 C) 98.1 F (36.7 C)  98.1 F (36.7 C)  TempSrc: Oral Oral  Oral  SpO2: 93% 92% 92% 91%  Weight:      Height:        Intake/Output Summary (Last 24 hours) at 07/29/2019 1527 Last data filed at 07/29/2019 1500 Gross per 24 hour  Intake 830 ml  Output 2750 ml  Net -1920 ml   Filed Weights   07/28/19 1233  Weight: 113.4 kg    Examination:  GENERAL: No acute distress.  Appears well.  HEENT: MMM.  Vision and hearing grossly intact.  NECK: Supple.  No apparent JVD.  RESP:  No IWOB.  Fair air movement.  Rhonchi bilaterally. CVS:  RRR. Heart sounds normal.  ABD/GI/GU: Bowel sounds present. Soft. Non tender.  MSK/EXT:  No apparent deformity or edema. Moves extremities. SKIN: no apparent skin lesion or wound NEURO: Awake, alert and oriented appropriately.  No gross deficit.  PSYCH: Calm. Normal affect.  Procedures:  None  Assessment & Plan: Acute respiratory failure  with hypoxia due to COVID-19 infection/pneumonia: Required up to 6 L to maintain saturation in low 90s.  Now in mid 90s on RA.  Inflammatory markers elevated. -Continue Decadron and remdesivir per protocol -Inhalers, mucolytic's, antitussive and vitamins -Trend inflammatory markers  Recent Labs    07/29/19 0356  DDIMER 1.03*  FERRITIN 373*  CRP 8.3*   Diet controlled DM-2 with hyperglycemia: CBG elevated -Start SSI and CBG monitoring -Check hemoglobin A1c  Paroxysmal A. fib: Rate controlled. -Continue amiodarone and Eliquis  Chronic diastolic CHF: Appears euvolemic. -Monitor fluid status  Hyponatremia: Resolved.  Hypokalemia -Replenish and recheck  Thrombocytopenia: Chronic -Continue monitoring       DVT prophylaxis: On Eliquis for A. fib Code Status: DNR/DNI Family Communication: Patient and/or RN.  Updated patient's son over the phone Disposition Plan: Remains inpatient Consultants: None   Microbiology summarized: 12/7-COVID-19 positive at PCP office.  Sch Meds:  Scheduled Meds: . amiodarone  200 mg Oral Daily  . apixaban  5 mg Oral BID  . carvedilol  3.125 mg Oral BID WC  . dexamethasone (DECADRON) injection  6 mg Intravenous Q24H  . rosuvastatin  10 mg Oral QHS   Continuous Infusions: . remdesivir 100 mg in NS 100 mL 100 mg (07/29/19 0921)   PRN Meds:.acetaminophen, guaiFENesin, ondansetron **OR** ondansetron (ZOFRAN) IV  Antimicrobials: Anti-infectives (From admission, onward)  Start     Dose/Rate Route Frequency Ordered Stop   07/29/19 1000  remdesivir 100 mg in sodium chloride 0.9 % 100 mL IVPB     100 mg 200 mL/hr over 30 Minutes Intravenous Daily 07/28/19 1649 08/02/19 0959   07/28/19 1700  remdesivir 200 mg in sodium chloride 0.9% 250 mL IVPB     200 mg 580 mL/hr over 30 Minutes Intravenous Once 07/28/19 1649 07/28/19 1801       I have personally reviewed the following labs and images: CBC: Recent Labs  Lab 07/28/19 1244 07/29/19  0356  WBC 5.1 5.2  NEUTROABS 3.5 4.4  HGB 13.5 13.2  HCT 41.0 39.5  MCV 96.9 96.1  PLT 104* 89*   BMP &GFR Recent Labs  Lab 07/28/19 1244 07/29/19 0356  NA 132* 135  K 3.7 3.4*  CL 95* 100  CO2 24 24  GLUCOSE 92 189*  BUN 14 12  CREATININE 1.01 0.82  CALCIUM 8.4* 8.5*   Estimated Creatinine Clearance: 78 mL/min (by C-G formula based on SCr of 0.82 mg/dL). Liver & Pancreas: Recent Labs  Lab 07/29/19 0356  AST 27  ALT 27  ALKPHOS 61  BILITOT 2.0*  PROT 6.4*  ALBUMIN 3.4*   No results for input(s): LIPASE, AMYLASE in the last 168 hours. No results for input(s): AMMONIA in the last 168 hours. Diabetic: No results for input(s): HGBA1C in the last 72 hours. No results for input(s): GLUCAP in the last 168 hours. Cardiac Enzymes: No results for input(s): CKTOTAL, CKMB, CKMBINDEX, TROPONINI in the last 168 hours. No results for input(s): PROBNP in the last 8760 hours. Coagulation Profile: No results for input(s): INR, PROTIME in the last 168 hours. Thyroid Function Tests: No results for input(s): TSH, T4TOTAL, FREET4, T3FREE, THYROIDAB in the last 72 hours. Lipid Profile: No results for input(s): CHOL, HDL, LDLCALC, TRIG, CHOLHDL, LDLDIRECT in the last 72 hours. Anemia Panel: Recent Labs    07/29/19 0356  FERRITIN 373*   Urine analysis:    Component Value Date/Time   COLORURINE YELLOW 07/28/2019 [place] 07/28/2019 1244   LABSPEC 1.011 07/28/2019 1244   PHURINE 5.0 07/28/2019 1244   GLUCOSEU NEGATIVE 07/28/2019 1244   HGBUR SMALL (A) 07/28/2019 [place] 07/28/2019 [place] 07/28/2019 1244   PROTEINUR NEGATIVE 07/28/2019 1244   UROBILINOGEN 1.0 11/22/2009 1423   NITRITE NEGATIVE 07/28/2019 1244   LEUKOCYTESUR NEGATIVE 07/28/2019 1244   Sepsis Labs: Invalid input(s): PROCALCITONIN, [place]  Microbiology: No results found for this or any previous visit (from the past 240 hour(s)).  Radiology  Studies: No results found.  25 minutes with more than 50% spent in reviewing records, counseling patient/family and coordinating care.   Taye T. [place]  If 7PM-7AM, please contact night-coverage [email or web address] Password [email or web address] 07/29/2019, 3:27 PM

## 2019-07-29 NOTE — Progress Notes (Addendum)
Patient arrived to room 1442 from ED. Alert and verbally responsive. Denies any pain or discomfort. On assessment, patient said that he has both R and L hearing aid. Only the L. Hearing aid was intact. R. Hearing not seen. Called ED to verify about the R. Hearing, was told nurse in covid room and will return call but she never did. During update with patient son in AM, Son confirm that patient did have both R and L hearing aid but not sure if he has both during admission. Writer asked son to verify with patient spouse.

## 2019-07-29 NOTE — Progress Notes (Signed)
Night shift RN reported that patient had 2 hearing aides, but currently only the L side was in.  Unable to find R hearing aide.  R hearing aide never noted in patient belongings once arriving to floor.  I called to check with the ED and make sure nothing was found - admission nurse reports that patient never had R hearing aide in hospital.  Was lost before coming into the hospital.

## 2019-07-30 LAB — COMPREHENSIVE METABOLIC PANEL
ALT: 26 U/L (ref 0–44)
AST: 30 U/L (ref 15–41)
Albumin: 3.3 g/dL — ABNORMAL LOW (ref 3.5–5.0)
Alkaline Phosphatase: 52 U/L (ref 38–126)
Anion gap: 10 (ref 5–15)
BUN: 20 mg/dL (ref 8–23)
CO2: 25 mmol/L (ref 22–32)
Calcium: 9.1 mg/dL (ref 8.9–10.3)
Chloride: 101 mmol/L (ref 98–111)
Creatinine, Ser: 0.86 mg/dL (ref 0.61–1.24)
GFR calc Af Amer: >60 mL/min (ref >60)
GFR calc non Af Amer: >60 mL/min (ref >60)
Glucose, Bld: 189 mg/dL — ABNORMAL HIGH (ref 70–99)
Potassium: 4.3 mmol/L (ref 3.5–5.1)
Sodium: 136 mmol/L (ref 135–145)
Total Bilirubin: 1.7 mg/dL — ABNORMAL HIGH (ref 0.3–1.2)
Total Protein: 6.5 g/dL (ref 6.5–8.1)

## 2019-07-30 LAB — CBC WITH DIFFERENTIAL/PLATELET
Abs Immature Granulocytes: 0.03 10*3/uL (ref 0.00–0.07)
Basophils Absolute: 0 10*3/uL (ref 0.0–0.1)
Basophils Relative: 0 %
Eosinophils Absolute: 0 10*3/uL (ref 0.0–0.5)
Eosinophils Relative: 0 %
HCT: 40.1 % (ref 39.0–52.0)
Hemoglobin: 13 g/dL (ref 13.0–17.0)
Immature Granulocytes: 0 %
Lymphocytes Relative: 5 %
Lymphs Abs: 0.7 10*3/uL (ref 0.7–4.0)
MCH: 31.6 pg (ref 26.0–34.0)
MCHC: 32.4 g/dL (ref 30.0–36.0)
MCV: 97.3 fL (ref 80.0–100.0)
Monocytes Absolute: 0.5 10*3/uL (ref 0.1–1.0)
Monocytes Relative: 4 %
Neutro Abs: 10.9 10*3/uL — ABNORMAL HIGH (ref 1.7–7.7)
Neutrophils Relative %: 91 %
Platelets: 120 10*3/uL — ABNORMAL LOW (ref 150–400)
RBC: 4.12 MIL/uL — ABNORMAL LOW (ref 4.22–5.81)
RDW: 12.9 % (ref 11.5–15.5)
WBC: 12.1 10*3/uL — ABNORMAL HIGH (ref 4.0–10.5)
nRBC: 0 % (ref 0.0–0.2)

## 2019-07-30 LAB — GLUCOSE, CAPILLARY
Glucose-Capillary: 161 mg/dL — ABNORMAL HIGH (ref 70–99)
Glucose-Capillary: 161 mg/dL — ABNORMAL HIGH (ref 70–99)
Glucose-Capillary: 126 mg/dL — ABNORMAL HIGH (ref 70–99)
Glucose-Capillary: 139 mg/dL — ABNORMAL HIGH (ref 70–99)

## 2019-07-30 LAB — FERRITIN: Ferritin: 527 ng/mL — ABNORMAL HIGH (ref 24–336)

## 2019-07-30 LAB — C-REACTIVE PROTEIN: CRP: 10.6 mg/dL — ABNORMAL HIGH (ref <1.0)

## 2019-07-30 LAB — D-DIMER, QUANTITATIVE: D-Dimer, Quant: 1.32 ug/mL-FEU — ABNORMAL HIGH (ref 0.00–0.50)

## 2019-07-30 NOTE — Evaluation (Signed)
Physical Therapy Evaluation Patient Details Name: Dylan [name] MRN: [id number] DOB: [birth year] Today's Date: 07/30/2019   History of Present Illness  85 year old male with history of PAF/bradycardia with PPM, diastolic CHF, diet-controlled DM-2, HLD, HTN and lung nodule presenting with shortness of breath, cough and positive Covid test at PCP office on the day of admission.  Clinical Impression  Pt admitted with above diagnosis. Pt currently with functional limitations due to the deficits listed below (see PT Problem List). Pt will benefit from skilled PT to increase their independence and safety with mobility to allow discharge to the venue listed below.  Pt ambulated in room with rollator with min/guard with o2 sat 90-02% on room air.  He is unsure if he would like HHPT, but is going to think about it.     Follow Up Recommendations Home health PT;Supervision for mobility/OOB    Equipment Recommendations  None recommended by PT    Recommendations for Other Services       Precautions / Restrictions Precautions Precautions: Fall Restrictions Weight Bearing Restrictions: No      Mobility  Bed Mobility Overal bed mobility: Needs Assistance Bed Mobility: Supine to Sit     Supine to sit: Min guard;HOB elevated;Supervision     General bed mobility comments: Pt came to EOB with use of rail and HOB elevated. Dizziness upon sitting that subsided after sitting for a minute  Transfers Overall transfer level: Needs assistance Equipment used: 4-wheeled walker Transfers: Sit to/from Stand Sit to Stand: Min guard         General transfer comment: min/guard for safety.  Poor use of UE, pulling on rollator, but due to [facility] and loud ventilation system, pt unable to understand cueing. no dizziness upon standing.  Ambulation/Gait Ambulation/Gait assistance: Min guard Gait Distance (Feet): 45 Feet(in room) Assistive device: 4-wheeled walker Gait Pattern/deviations: Step-through  pattern Gait velocity: decreased   General Gait Details: Pt manuevered with rollator in room with tight spaces and turns with min/guard. o2 90-92% on room air.  Stairs            Wheelchair Mobility    Modified Rankin (Stroke Patients Only)       Balance Overall balance assessment: History of Falls;Needs assistance   Sitting balance-[name]: Good       Standing balance-[name]: Poor Standing balance comment: requires UE support                             Pertinent Vitals/Pain Pain Assessment: No/denies pain    Home Living Family/patient expects to be discharged to:: Private residence Living Arrangements: Spouse/significant other Available Help at Discharge: Family;Available 24 hours/day Type of Home: House Home Access: Stairs to enter;Ramped entrance   Entrance Stairs-Number of Steps: 1 Home Layout: One level Home Equipment: Walker - 2 wheels;Walker - 4 wheels;Grab bars - tub/shower;Shower seat      Prior Function Level of Independence: Independent with assistive device(s)         Comments: Amb with RW at home and rollator for community ambulation     Hand Dominance        Extremity/Trunk Assessment   Upper Extremity Assessment Upper Extremity Assessment: Overall WFL for tasks assessed    Lower Extremity Assessment Lower Extremity Assessment: Overall WFL for tasks assessed;Generalized weakness       Communication   Communication: HOH  Cognition Arousal/Alertness: Awake/alert Behavior During Therapy: WFL for tasks assessed/performed Overall Cognitive Status: Within Functional  Limits for tasks assessed                                 General Comments: HOH and ventilation in room made communication challenging at times.      General Comments      Exercises     Assessment/Plan    PT Assessment Patient needs continued PT services  PT Problem List Decreased strength;Decreased activity  tolerance;Cardiopulmonary status limiting activity;Decreased mobility;Decreased safety awareness       PT Treatment Interventions DME instruction;Gait training;Functional mobility training;Therapeutic activities;Therapeutic exercise;Balance training;Patient/family education    PT Goals (Current goals can be found in the Care Plan section)  Acute Rehab PT Goals Patient Stated Goal: go home PT Goal Formulation: With patient Time For Goal Achievement: 08/13/19 Potential to Achieve Goals: Good    Frequency Min 3X/week   Barriers to discharge        Co-evaluation               AM-PAC PT "6 Clicks" Mobility  Outcome Measure Help needed turning from your back to your side while in a flat bed without using bedrails?: A Little Help needed moving from lying on your back to sitting on the side of a flat bed without using bedrails?: A Little Help needed moving to and from a bed to a chair (including a wheelchair)?: A Little Help needed standing up from a chair using your arms (e.g., wheelchair or bedside chair)?: A Little Help needed to walk in hospital room?: A Little Help needed climbing 3-5 steps with a railing? : A Little 6 Click Score: 18    End of Session   Activity Tolerance: Patient tolerated treatment well;Patient limited by fatigue Patient left: in chair;with call bell/phone within reach;with chair alarm set Nurse Communication: Mobility status PT Visit Diagnosis: Unsteadiness on feet (R26.81);Difficulty in walking, not elsewhere classified (R26.2)    Time: [id number] PT Time Calculation (min) (ACUTE ONLY): 29 min   Charges:   PT Evaluation $PT Eval Low Complexity: 1 Low PT Treatments $Gait Training: 8-22 mins        Denis Carreon L. Tamala Julian, [place] Pager [phone] 07/30/2019   [name] 07/30/2019, 12:13 PM

## 2019-07-30 NOTE — Progress Notes (Signed)
Patient in good spirits tonight,states he feels much better than when he was admitted, O2 sats maintained in the low 90's on room air, occasional dry cough, using incentive spirometer as instructed, condom cath in place and functioning well, call bell in reach, bed locked and low, paced on the monitor on the 60's, will continue to monitor throughout the shift.

## 2019-07-30 NOTE — Care Management Important Message (Signed)
Important Message  Patient Details IMLetter given to Gabriel Earing RN  Case Manager to present to the Patient Name: Dylan [name] MRN: [id number] Date of Birth: 1926/06/06   Medicare Important Message Given:  Yes     Kerin Salen 07/30/2019, 2:33 PM

## 2019-07-30 NOTE — Progress Notes (Signed)
PROGRESS NOTE  Dylan [name] [id number] DOB: 12-26-1925   PCP: Haywood Pao, MD  Patient is from: Home  DOA: 07/28/2019 LOS: 2  Brief Narrative / Interim history: 85 year old male with history of PAF/bradycardia with PPM, diastolic CHF, diet-controlled DM-2, HLD, HTN and lung nodule presenting with shortness of breath, cough and positive Covid test at PCP office on the day of admission.  In ED, saturating in low 90s on RA but required up to 6 L to maintain saturation in low 90s later.  HDS.  CXR with septal changes of multilobar pneumonia.  Admitted and started on remdesivir and Decadron.  Subjective: No major events overnight of this morning.  Was on 2 L by [place] overnight.  Now on room air.  Reports improvement in his breathing and cough.  Denies chest pain, GI or UTI symptoms. Inflammatory markers uptrending.  Objective: Vitals:   07/29/19 1244 07/29/19 2039 07/30/19 0557 07/30/19 1215  BP: (!) 111/59 125/62 128/71 112/62  Pulse: 64 69 71 63  Resp: 17 19 18 17   Temp: 98.1 F (36.7 C) 98.5 F (36.9 C) 97.6 F (36.4 C) 98 F (36.7 C)  TempSrc: Oral Oral  Oral  SpO2: 91% 90% 96% 92%  Weight:      Height:        Intake/Output Summary (Last 24 hours) at 07/30/2019 1224 Last data filed at 07/30/2019 0837 Gross per 24 hour  Intake 340 ml  Output 1775 ml  Net -1435 ml   Filed Weights   07/28/19 1233  Weight: 113.4 kg    Examination:  GENERAL: No acute distress.  Appears well.  HEENT: MMM.  Vision and hearing grossly intact.  NECK: Supple.  No apparent JVD.  RESP:  No IWOB.  Fair air movements with rhonchi bilaterally CVS:  RRR. Heart sounds normal.  ABD/GI/GU: Bowel sounds present. Soft. Non tender.  MSK/EXT:  Moves extremities. No apparent deformity or edema.  SKIN: no apparent skin lesion or wound NEURO: Awake, alert and oriented appropriately.  No apparent focal neuro deficit. PSYCH: Calm. Normal affect.  Procedures:  None  Assessment & Plan:  Acute respiratory failure with hypoxia due to COVID-19 infection/pneumonia: Respiratory symptoms improved.  However, inflammatory markers uptrending. Recent Labs    07/29/19 0356 07/30/19 0404  DDIMER 1.03* 1.32*  FERRITIN 373* 527*  CRP 8.3* 10.6*  -Continue Decadron and remdesivir 12/8> -Inhalers, mucolytic's, antitussive and vitamins -Trend inflammatory markers -Incentive spirometry and OOB   Diet controlled DM-2 with hyperglycemia: A1c 5.9%.  CBG elevated likely due to steroid. Recent Labs    07/29/19 2028 07/30/19 0820 07/30/19 1208  GLUCAP 149* 161* 161*  -Continue SSI and CBG monitoring  Paroxysmal A. fib: Rate controlled. -Continue amiodarone and Eliquis  Chronic diastolic CHF: Appears euvolemic.  Excellent urine output without diuretics. -Monitor fluid status  Hyponatremia: Resolved.  Hypokalemia -Replenish and recheck  Thrombocytopenia: Chronic.  Improved. -Continue monitoring       DVT prophylaxis: On Eliquis for A. fib Code Status: DNR/DNI Family Communication: Updated patient's son over the phone 12/9. Disposition Plan: Remains inpatient.  Inflammatory markers uptrending.  He could be early in the course of Covid infection. Consultants: None   Microbiology summarized: 12/7-COVID-19 positive at PCP office.  Confirmed by admitting provider  Sch Meds:  Scheduled Meds: . amiodarone  200 mg Oral Daily  . apixaban  5 mg Oral BID  . carvedilol  3.125 mg Oral BID WC  . dexamethasone (DECADRON) injection  6 mg Intravenous Q24H  .  dextromethorphan-guaiFENesin  1 tablet Oral BID  . insulin aspart  0-15 Units Subcutaneous TID WC  . insulin aspart  0-5 Units Subcutaneous QHS  . rosuvastatin  10 mg Oral QHS   Continuous Infusions: . remdesivir 100 mg in NS 100 mL 100 mg (07/30/19 0833)   PRN Meds:.acetaminophen, ondansetron **OR** ondansetron (ZOFRAN) IV  Antimicrobials: Anti-infectives (From admission, onward)   Start     Dose/Rate Route Frequency  Ordered Stop   07/29/19 1000  remdesivir 100 mg in sodium chloride 0.9 % 100 mL IVPB     100 mg 200 mL/hr over 30 Minutes Intravenous Daily 07/28/19 1649 08/02/19 0959   07/28/19 1700  remdesivir 200 mg in sodium chloride 0.9% 250 mL IVPB     200 mg 580 mL/hr over 30 Minutes Intravenous Once 07/28/19 1649 07/28/19 1801       I have personally reviewed the following labs and images: CBC: Recent Labs  Lab 07/28/19 1244 07/29/19 0356 07/30/19 0404  WBC 5.1 5.2 12.1*  NEUTROABS 3.5 4.4 10.9*  HGB 13.5 13.2 13.0  HCT 41.0 39.5 40.1  MCV 96.9 96.1 97.3  PLT 104* 89* 120*   BMP &GFR Recent Labs  Lab 07/28/19 1244 07/29/19 0356 07/30/19 0404  NA 132* 135 136  K 3.7 3.4* 4.3  CL 95* 100 101  CO2 24 24 25   GLUCOSE 92 189* 189*  BUN 14 12 20   CREATININE 1.01 0.82 0.86  CALCIUM 8.4* 8.5* 9.1   Estimated Creatinine Clearance: 74.4 mL/min (by C-G formula based on SCr of 0.86 mg/dL). Liver & Pancreas: Recent Labs  Lab 07/29/19 0356 07/30/19 0404  AST 27 30  ALT 27 26  ALKPHOS 61 52  BILITOT 2.0* 1.7*  PROT 6.4* 6.5  ALBUMIN 3.4* 3.3*   No results for input(s): LIPASE, AMYLASE in the last 168 hours. No results for input(s): AMMONIA in the last 168 hours. Diabetic: Recent Labs    07/29/19 0355  HGBA1C 5.9*   Recent Labs  Lab 07/29/19 1652 07/29/19 2028 07/30/19 0820 07/30/19 1208  GLUCAP 152* 149* 161* 161*   Cardiac Enzymes: No results for input(s): CKTOTAL, CKMB, CKMBINDEX, TROPONINI in the last 168 hours. No results for input(s): PROBNP in the last 8760 hours. Coagulation Profile: No results for input(s): INR, PROTIME in the last 168 hours. Thyroid Function Tests: No results for input(s): TSH, T4TOTAL, FREET4, T3FREE, THYROIDAB in the last 72 hours. Lipid Profile: No results for input(s): CHOL, HDL, LDLCALC, TRIG, CHOLHDL, LDLDIRECT in the last 72 hours. Anemia Panel: Recent Labs    07/29/19 0356 07/30/19 0404  FERRITIN 373* 527*   Urine  analysis:    Component Value Date/Time   COLORURINE YELLOW 07/28/2019 [place] 07/28/2019 1244   LABSPEC 1.011 07/28/2019 1244   PHURINE 5.0 07/28/2019 1244   GLUCOSEU NEGATIVE 07/28/2019 1244   HGBUR SMALL (A) 07/28/2019 [place] 07/28/2019 1244   KETONESUR NEGATIVE 07/28/2019 1244   PROTEINUR NEGATIVE 07/28/2019 1244   UROBILINOGEN 1.0 11/22/2009 1423   NITRITE NEGATIVE 07/28/2019 1244   LEUKOCYTESUR NEGATIVE 07/28/2019 1244   Sepsis Labs: Invalid input(s): PROCALCITONIN, [place]  Microbiology: No results found for this or any previous visit (from the past 240 hour(s)).  Radiology Studies: No results found.    Taye T. [place]  If 7PM-7AM, please contact night-coverage [email or web address] Password [facility] 07/30/2019, 12:24 PM

## 2019-07-30 NOTE — Plan of Care (Signed)
  Problem: Education: Goal: Knowledge of General Education information will improve Description: Including pain rating scale, medication(s)/side effects and non-pharmacologic comfort measures Outcome: Progressing   Problem: Activity: Goal: Risk for activity intolerance will decrease Outcome: Progressing   Problem: Nutrition: Goal: Adequate nutrition will be maintained Outcome: Progressing   Problem: Elimination: Goal: Will not experience complications related to bowel motility Outcome: Progressing Goal: Will not experience complications related to urinary retention Outcome: Progressing   Problem: Pain Managment: Goal: General experience of comfort will improve Outcome: Progressing   Problem: Safety: Goal: Ability to remain free from injury will improve Outcome: Progressing   Problem: Skin Integrity: Goal: Risk for impaired skin integrity will decrease Outcome: Progressing   Problem: Education: Goal: Knowledge of risk factors and measures for prevention of condition will improve Outcome: Progressing   Problem: Coping: Goal: Psychosocial and spiritual needs will be supported Outcome: Progressing   Problem: Respiratory: Goal: Will maintain a patent airway Outcome: Progressing Goal: Complications related to the disease process, condition or treatment will be avoided or minimized Outcome: Progressing

## 2019-07-31 DIAGNOSIS — D72825 Bandemia: Secondary | ICD-10-CM

## 2019-07-31 DIAGNOSIS — D696 Thrombocytopenia, unspecified: Secondary | ICD-10-CM

## 2019-07-31 LAB — CBC WITH DIFFERENTIAL/PLATELET
Abs Immature Granulocytes: 0.11 10*3/uL — ABNORMAL HIGH (ref 0.00–0.07)
Basophils Absolute: 0 10*3/uL (ref 0.0–0.1)
Basophils Relative: 0 %
Eosinophils Absolute: 0 10*3/uL (ref 0.0–0.5)
Eosinophils Relative: 0 %
HCT: 39 % (ref 39.0–52.0)
Hemoglobin: 12.8 g/dL — ABNORMAL LOW (ref 13.0–17.0)
Immature Granulocytes: 1 %
Lymphocytes Relative: 5 %
Lymphs Abs: 0.8 10*3/uL (ref 0.7–4.0)
MCH: 32 pg (ref 26.0–34.0)
MCHC: 32.8 g/dL (ref 30.0–36.0)
MCV: 97.5 fL (ref 80.0–100.0)
Monocytes Absolute: 0.6 10*3/uL (ref 0.1–1.0)
Monocytes Relative: 4 %
Neutro Abs: 13.4 10*3/uL — ABNORMAL HIGH (ref 1.7–7.7)
Neutrophils Relative %: 90 %
Platelets: 137 10*3/uL — ABNORMAL LOW (ref 150–400)
RBC: 4 MIL/uL — ABNORMAL LOW (ref 4.22–5.81)
RDW: 13 % (ref 11.5–15.5)
WBC: 14.9 10*3/uL — ABNORMAL HIGH (ref 4.0–10.5)
nRBC: 0 % (ref 0.0–0.2)

## 2019-07-31 LAB — COMPREHENSIVE METABOLIC PANEL
ALT: 21 U/L (ref 0–44)
AST: 23 U/L (ref 15–41)
Albumin: 3.3 g/dL — ABNORMAL LOW (ref 3.5–5.0)
Alkaline Phosphatase: 53 U/L (ref 38–126)
Anion gap: 11 (ref 5–15)
BUN: 25 mg/dL — ABNORMAL HIGH (ref 8–23)
CO2: 24 mmol/L (ref 22–32)
Calcium: 8.8 mg/dL — ABNORMAL LOW (ref 8.9–10.3)
Chloride: 100 mmol/L (ref 98–111)
Creatinine, Ser: 0.82 mg/dL (ref 0.61–1.24)
GFR calc Af Amer: >60 mL/min (ref >60)
GFR calc non Af Amer: >60 mL/min (ref >60)
Glucose, Bld: 155 mg/dL — ABNORMAL HIGH (ref 70–99)
Potassium: 4 mmol/L (ref 3.5–5.1)
Sodium: 135 mmol/L (ref 135–145)
Total Bilirubin: 2.1 mg/dL — ABNORMAL HIGH (ref 0.3–1.2)
Total Protein: 6.2 g/dL — ABNORMAL LOW (ref 6.5–8.1)

## 2019-07-31 LAB — C-REACTIVE PROTEIN: CRP: 8 mg/dL — ABNORMAL HIGH (ref <1.0)

## 2019-07-31 LAB — GLUCOSE, CAPILLARY
Glucose-Capillary: 146 mg/dL — ABNORMAL HIGH (ref 70–99)
Glucose-Capillary: 119 mg/dL — ABNORMAL HIGH (ref 70–99)
Glucose-Capillary: 135 mg/dL — ABNORMAL HIGH (ref 70–99)
Glucose-Capillary: 122 mg/dL — ABNORMAL HIGH (ref 70–99)

## 2019-07-31 LAB — D-DIMER, QUANTITATIVE: D-Dimer, Quant: 1.14 ug/mL-FEU — ABNORMAL HIGH (ref 0.00–0.50)

## 2019-07-31 LAB — FERRITIN: Ferritin: 470 ng/mL — ABNORMAL HIGH (ref 24–336)

## 2019-07-31 MED ORDER — TOCILIZUMAB 400 MG/20ML IV SOLN
800.0000 mg | Freq: Once | INTRAVENOUS | Status: AC
  Administered 2019-07-31: 800 mg via INTRAVENOUS
  Filled 2019-07-31: qty 40

## 2019-07-31 MED ORDER — GUAIFENESIN ER 600 MG PO TB12
600.0000 mg | ORAL_TABLET | Freq: Two times a day (BID) | ORAL | Status: DC
  Administered 2019-07-31 – 2019-08-02 (×5): 600 mg via ORAL
  Filled 2019-07-31 (×5): qty 1

## 2019-07-31 MED ORDER — HYDROCODONE-HOMATROPINE 5-1.5 MG/5ML PO SYRP
5.0000 mL | ORAL_SOLUTION | Freq: Four times a day (QID) | ORAL | Status: DC | PRN
  Administered 2019-07-31: 5 mL via ORAL
  Filled 2019-07-31: qty 5

## 2019-07-31 NOTE — TOC Progression Note (Signed)
Transition of Care [facility]) - Progression Note    Patient Details  Name: BEZALEL SENN MRN: [id number] Date of Birth: 06-11-1926  [facility] CM/SW Contact  Purcell Mouton, RN Phone Number: 07/31/2019, 12:19 PM  Clinical Narrative:    Pt admitted with cco SOB, COVID positive. Spoke with ot concerning discharge plans. Pt states that he do not believe he will need HH at present time and would like to think about it.        Expected Discharge Plan and Services                                                 Social Determinants of Health (SDOH) Interventions    Readmission Risk Interventions No flowsheet data found.

## 2019-07-31 NOTE — Progress Notes (Signed)
PROGRESS NOTE  Dylan [name] [id number] DOB: 06/26/1926   PCP: Haywood Pao, MD  Patient is from: Home  DOA: 07/28/2019 LOS: 3  Brief Narrative / Interim history: 85 year old male with history of PAF/bradycardia with PPM, diastolic CHF, diet-controlled DM-2, HLD, HTN and lung nodule presenting with shortness of breath, cough and positive Covid test at PCP office on the day of admission.  In ED, saturating in low 90s on RA but required up to 6 L to maintain saturation in low 90s later.  HDS.  CXR with septal changes of multilobar pneumonia.  Admitted and started on remdesivir and Decadron.  Subjective: No major events overnight.  He desaturated to 88% on room air this morning.  Placed on 3 L and saturating in low 90s.  Otherwise, he reports improvement in his breathing and cough.  He denies chest pain, GI or UTI symptoms.  Objective: Vitals:   07/31/19 0531 07/31/19 0914 07/31/19 0938 07/31/19 0940  BP: (!) 150/87 (!) 144/77    Pulse: 67 62    Resp: 20 18    Temp: 97.6 F (36.4 C) 98 F (36.7 C)    TempSrc: Oral Oral    SpO2: 93% 91% (!) 88% 92%  Weight:      Height:        Intake/Output Summary (Last 24 hours) at 07/31/2019 1126 Last data filed at 07/31/2019 0200 Gross per 24 hour  Intake 480 ml  Output 1025 ml  Net -545 ml   Filed Weights   07/28/19 1233  Weight: 113.4 kg    Examination: GENERAL: No acute distress.  Nontoxic. HEENT: MMM.  Vision and hearing grossly intact.  NECK: Supple.  No apparent JVD.  RESP:  No IWOB.  Diminished aeration with rhonchi bilaterally. CVS:  RRR. Heart sounds normal.  ABD/GI/GU: Bowel sounds present. Soft. Non tender.  MSK/EXT:  Moves extremities. No apparent deformity or edema.  SKIN: no apparent skin lesion or wound NEURO: Awake, alert and oriented appropriately.  No apparent focal neuro deficit. PSYCH: Calm. Normal affect.  Procedures:  None  Assessment & Plan: Acute respiratory failure with hypoxia due to  COVID-19 infection/pneumonia: Respiratory symptoms improved but continues to require supplemental oxygen to maintain appropriate saturation.  Inflammatory markers downtrending now. Recent Labs    07/29/19 0356 07/30/19 0404 07/31/19 0326  DDIMER 1.03* 1.32* 1.14*  FERRITIN 373* 527* 470*  CRP 8.3* 10.6* 8.0*  -Continue Decadron and remdesivir 12/8> -Verbally consented for Actemra.  Pharmacy consulted for dosing. -Inhalers, mucolytic's, antitussive and vitamins -Trend inflammatory markers -Incentive spirometry and OOB  Diet controlled DM-2 with hyperglycemia: A1c 5.9%.  CBG elevated likely due to steroid. Recent Labs    07/30/19 2125 07/31/19 0759 07/31/19 1108  GLUCAP 139* 146* 119*  -Continue SSI and CBG monitoring  Paroxysmal A. fib: Rate controlled. -Continue amiodarone and Eliquis  Chronic diastolic CHF: Appears euvolemic.  Excellent urine output without diuretics. -Monitor fluid status  Hyponatremia: Resolved.  Hypokalemia -Replenish and recheck  Thrombocytopenia: Chronic.  Improved. -Continue monitoring  Leukocytosis/bandemia: Likely due to steroid. -Continue monitoring       DVT prophylaxis: On Eliquis for A. fib Code Status: DNR/DNI Family Communication: Updated patient's son over the phone Disposition Plan: Remains inpatient.  Transfer to [facility] Consultants: None   Microbiology summarized: 12/7-COVID-19 positive at PCP office.  Confirmed by admitting provider  Sch Meds:  Scheduled Meds: . amiodarone  200 mg Oral Daily  . apixaban  5 mg Oral BID  . carvedilol  3.125 mg  Oral BID WC  . dexamethasone (DECADRON) injection  6 mg Intravenous Q24H  . dextromethorphan-guaiFENesin  1 tablet Oral BID  . insulin aspart  0-15 Units Subcutaneous TID WC  . insulin aspart  0-5 Units Subcutaneous QHS  . rosuvastatin  10 mg Oral QHS   Continuous Infusions: . remdesivir 100 mg in NS 100 mL 100 mg (07/31/19 0936)   PRN Meds:.acetaminophen, ondansetron **OR**  ondansetron (ZOFRAN) IV  Antimicrobials: Anti-infectives (From admission, onward)   Start     Dose/Rate Route Frequency Ordered Stop   07/29/19 1000  remdesivir 100 mg in sodium chloride 0.9 % 100 mL IVPB     100 mg 200 mL/hr over 30 Minutes Intravenous Daily 07/28/19 1649 08/02/19 0959   07/28/19 1700  remdesivir 200 mg in sodium chloride 0.9% 250 mL IVPB     200 mg 580 mL/hr over 30 Minutes Intravenous Once 07/28/19 1649 07/28/19 1801       I have personally reviewed the following labs and images: CBC: Recent Labs  Lab 07/28/19 1244 07/29/19 0356 07/30/19 0404 07/31/19 0326  WBC 5.1 5.2 12.1* 14.9*  NEUTROABS 3.5 4.4 10.9* 13.4*  HGB 13.5 13.2 13.0 12.8*  HCT 41.0 39.5 40.1 39.0  MCV 96.9 96.1 97.3 97.5  PLT 104* 89* 120* 137*   BMP &GFR Recent Labs  Lab 07/28/19 1244 07/29/19 0356 07/30/19 0404 07/31/19 0326  NA 132* 135 136 135  K 3.7 3.4* 4.3 4.0  CL 95* 100 101 100  CO2 24 24 25 24   GLUCOSE 92 189* 189* 155*  BUN 14 12 20  25*  CREATININE 1.01 0.82 0.86 0.82  CALCIUM 8.4* 8.5* 9.1 8.8*   Estimated Creatinine Clearance: 78 mL/min (by C-G formula based on SCr of 0.82 mg/dL). Liver & Pancreas: Recent Labs  Lab 07/29/19 0356 07/30/19 0404 07/31/19 0326  AST 27 30 23   ALT 27 26 21   ALKPHOS 61 52 53  BILITOT 2.0* 1.7* 2.1*  PROT 6.4* 6.5 6.2*  ALBUMIN 3.4* 3.3* 3.3*   No results for input(s): LIPASE, AMYLASE in the last 168 hours. No results for input(s): AMMONIA in the last 168 hours. Diabetic: Recent Labs    07/29/19 0355  HGBA1C 5.9*   Recent Labs  Lab 07/30/19 1208 07/30/19 1558 07/30/19 2125 07/31/19 0759 07/31/19 1108  GLUCAP 161* 126* 139* 146* 119*   Cardiac Enzymes: No results for input(s): CKTOTAL, CKMB, CKMBINDEX, TROPONINI in the last 168 hours. No results for input(s): PROBNP in the last 8760 hours. Coagulation Profile: No results for input(s): INR, PROTIME in the last 168 hours. Thyroid Function Tests: No results for  input(s): TSH, T4TOTAL, FREET4, T3FREE, THYROIDAB in the last 72 hours. Lipid Profile: No results for input(s): CHOL, HDL, LDLCALC, TRIG, CHOLHDL, LDLDIRECT in the last 72 hours. Anemia Panel: Recent Labs    07/30/19 0404 07/31/19 0326  FERRITIN 527* 470*   Urine analysis:    Component Value Date/Time   COLORURINE YELLOW 07/28/2019 [place] 07/28/2019 1244   LABSPEC 1.011 07/28/2019 1244   PHURINE 5.0 07/28/2019 1244   GLUCOSEU NEGATIVE 07/28/2019 1244   HGBUR SMALL (A) 07/28/2019 [place] 07/28/2019 1244   KETONESUR NEGATIVE 07/28/2019 1244   PROTEINUR NEGATIVE 07/28/2019 1244   UROBILINOGEN 1.0 11/22/2009 1423   NITRITE NEGATIVE 07/28/2019 1244   LEUKOCYTESUR NEGATIVE 07/28/2019 1244   Sepsis Labs: Invalid input(s): PROCALCITONIN, [place]  Microbiology: No results found for this or any previous visit (from the past 240 hour(s)).  Radiology  Studies: No results found.   The rationale for the off label use of Actemra its known side effects, potential benefits was  discussed with patient.The use of Actemra is based on published clinical articles/anecdotal data-there have been some randomized trials that are negative, and lately there have been a few positive randomized trials as well.  Patient does not have a history of pulmonary tuberculosis, or hepatitis B or C Complete risks and long-term side effects are unknown, however in the best clinical judgment it is felt that the clinical benefit at this time outweighs medical risks given tenuous clinical state of the patient and the potential for deterioration.  Patient agree's with the treatment plan and consent to the use of Actemra.   Jet Armbrust T. [place]  If 7PM-7AM, please contact night-coverage [email or web address] Password [email or web address] 07/31/2019, 11:26 AM

## 2019-07-31 NOTE — Progress Notes (Signed)
Patient transported by [employer] [facility].  Report called to Raquel Sarna, RN at [facility].  Odette Fraction, spouse, updated regarding patient's transfer, and she says she will update son Kendryk Saco.

## 2019-08-01 LAB — GLUCOSE, CAPILLARY
Glucose-Capillary: 142 mg/dL — ABNORMAL HIGH (ref 70–99)
Glucose-Capillary: 160 mg/dL — ABNORMAL HIGH (ref 70–99)
Glucose-Capillary: 176 mg/dL — ABNORMAL HIGH (ref 70–99)
Glucose-Capillary: 106 mg/dL — ABNORMAL HIGH (ref 70–99)

## 2019-08-01 NOTE — Plan of Care (Signed)
  Problem: Education: Goal: Knowledge of General Education information will improve Description: Including pain rating scale, medication(s)/side effects and non-pharmacologic comfort measures Outcome: Progressing   Problem: Activity: Goal: Risk for activity intolerance will decrease Outcome: Progressing   Problem: Nutrition: Goal: Adequate nutrition will be maintained Outcome: Progressing   Problem: Elimination: Goal: Will not experience complications related to bowel motility Outcome: Progressing Goal: Will not experience complications related to urinary retention Outcome: Progressing   Problem: Pain Managment: Goal: General experience of comfort will improve Outcome: Progressing   Problem: Safety: Goal: Ability to remain free from injury will improve Outcome: Progressing   Problem: Skin Integrity: Goal: Risk for impaired skin integrity will decrease Outcome: Progressing   Problem: Education: Goal: Knowledge of risk factors and measures for prevention of condition will improve Outcome: Progressing   Problem: Coping: Goal: Psychosocial and spiritual needs will be supported Outcome: Progressing   Problem: Respiratory: Goal: Will maintain a patent airway Outcome: Progressing Goal: Complications related to the disease process, condition or treatment will be avoided or minimized Outcome: Progressing   Problem: Nutrition: Goal: Adequate nutrition will be maintained Outcome: Progressing

## 2019-08-01 NOTE — Progress Notes (Signed)
Updated spouse on patient condition and plan of care.  All questions welcomed and answered.

## 2019-08-01 NOTE — Progress Notes (Signed)
PROGRESS NOTE    Dylan [name]  [id number] DOB: 1926-01-04 DOA: 07/28/2019 PCP: Haywood Pao, MD    Brief Narrative:  85 year old male with history of PAF/bradycardia with PPM, diastolic CHF, diet-controlled DM-2, HLD, HTN and lung nodule presenting with shortness of breath, cough and positive Covid test at PCP office on the day of admission.  In ED, saturating in low 90s on RA but required up to 6 L to maintain saturation in low 90s later.CXR with septal changes of multilobar pneumonia.  Admitted and started on remdesivir and Decadron.  Assessment & Plan:   Principal Problem:   Pneumonia due to COVID-19 virus Active Problems:   Type 2 diabetes mellitus without complication (HCC)   Atrial fibrillation (HCC)   Chronic diastolic CHF (congestive heart failure) (HCC)  Acute respiratory failure with hypoxia due to COVID-19 pneumonia: Continue to monitor due to significant symptoms, still on supplemental oxygen. chest physiotherapy, incentive spirometry, deep breathing exercises, sputum induction, mucolytic's and bronchodilators. Supplemental oxygen to keep saturations more than 90%. Covid directed therapy with , steroids, on Decadron.we will continue.  Change to oral on discharge. remdesivir, day 5/5 actemra, 1 dose given on 07/31/2019 Due to severity of symptoms, patient will need daily inflammatory markers, liver function test to monitor and direct COVID-19 therapies. Some clinical improvement today.  Continue to monitor, advance activities and evaluate for oxygen need.  Type 2 diabetes with hyperglycemia: Aggravated by use of steroids.  Continue on sliding scale insulin.  Diet controlled at home.  Paroxysmal A. fib: Currently sinus rhythm on amiodarone and Eliquis.  Chronic diastolic congestive heart failure: Euvolemic.  Hypokalemia: Replaced  Elevated bilirubin with normal transaminases: Probably chronic or underlying Gilbert's syndrome.   DVT prophylaxis: On  Eliquis Code Status: DNR/DNI Family Communication: Patient's wife called and updated. Disposition Plan: Home after stabilization. Can transfer to [facility] bed waiting for improvement.  Consultants:   None  Procedures:   None  Antimicrobials:   Remdesivir, 07/28/2019-08/01/2019   Subjective: Patient seen and examined.  Got out of the bed to the chair with the help of nurses.  Has some cough but no fever.  Still on 3 L oxygen.  Objective: Vitals:   07/31/19 1730 07/31/19 1944 08/01/19 0450 08/01/19 0731  BP: (!) 172/91 (!) 139/91 (!) 155/90 (!) 152/81  Pulse: 81 62 62 61  Resp: 19 18 17 16   Temp: 97.7 F (36.5 C) (!) 97.5 F (36.4 C) 97.7 F (36.5 C) 97.6 F (36.4 C)  TempSrc: Oral Oral Oral Oral  SpO2: 93% 92% 93% 91%  Weight:      Height:        Intake/Output Summary (Last 24 hours) at 08/01/2019 1156 Last data filed at 08/01/2019 0400 Gross per 24 hour  Intake 550 ml  Output 1120 ml  Net -570 ml   Filed Weights   07/28/19 1233  Weight: 113.4 kg    Examination:  General exam: Appears calm and comfortable, on 3 L oxygen.  Sitting in couch.  Looks comfortable. Respiratory system: Clear to auscultation. Respiratory effort normal.  No added sounds. Cardiovascular system: S1 & S2 heard, RRR. No JVD, murmurs, rubs, gallops or clicks. No pedal edema. Gastrointestinal system: Abdomen is nondistended, soft and nontender. No organomegaly or masses felt. Normal bowel sounds heard. Central nervous system: Alert and oriented. No focal neurological deficits. Extremities: Symmetric 5 x 5 power. Skin: No rashes, lesions or ulcers Psychiatry: Judgement and insight appear normal. Mood & affect appropriate.     Data  Reviewed: I have personally reviewed following labs and imaging studies  CBC: Recent Labs  Lab 07/28/19 1244 07/29/19 0356 07/30/19 0404 07/31/19 0326  WBC 5.1 5.2 12.1* 14.9*  NEUTROABS 3.5 4.4 10.9* 13.4*  HGB 13.5 13.2 13.0 12.8*  HCT 41.0 39.5  40.1 39.0  MCV 96.9 96.1 97.3 97.5  PLT 104* 89* 120* 0000000*   Basic Metabolic Panel: Recent Labs  Lab 07/28/19 1244 07/29/19 0356 07/30/19 0404 07/31/19 0326  NA 132* 135 136 135  K 3.7 3.4* 4.3 4.0  CL 95* 100 101 100  CO2 24 24 25 24   GLUCOSE 92 189* 189* 155*  BUN 14 12 20  25*  CREATININE 1.01 0.82 0.86 0.82  CALCIUM 8.4* 8.5* 9.1 8.8*   GFR: Estimated Creatinine Clearance: 78 mL/min (by C-G formula based on SCr of 0.82 mg/dL). Liver Function Tests: Recent Labs  Lab 07/29/19 0356 07/30/19 0404 07/31/19 0326  AST 27 30 23   ALT 27 26 21   ALKPHOS 61 52 53  BILITOT 2.0* 1.7* 2.1*  PROT 6.4* 6.5 6.2*  ALBUMIN 3.4* 3.3* 3.3*   No results for input(s): LIPASE, AMYLASE in the last 168 hours. No results for input(s): AMMONIA in the last 168 hours. Coagulation Profile: No results for input(s): INR, PROTIME in the last 168 hours. Cardiac Enzymes: No results for input(s): CKTOTAL, CKMB, CKMBINDEX, TROPONINI in the last 168 hours. BNP (last 3 results) No results for input(s): PROBNP in the last 8760 hours. HbA1C: No results for input(s): HGBA1C in the last 72 hours. CBG: Recent Labs  Lab 07/31/19 0759 07/31/19 1108 07/31/19 1646 07/31/19 2137 08/01/19 0730  GLUCAP 146* 119* 135* 122* 142*   Lipid Profile: No results for input(s): CHOL, HDL, LDLCALC, TRIG, CHOLHDL, LDLDIRECT in the last 72 hours. Thyroid Function Tests: No results for input(s): TSH, T4TOTAL, FREET4, T3FREE, THYROIDAB in the last 72 hours. Anemia Panel: Recent Labs    07/30/19 0404 07/31/19 0326  FERRITIN 527* 470*   Sepsis Labs: No results for input(s): PROCALCITON, LATICACIDVEN in the last 168 hours.  No results found for this or any previous visit (from the past 240 hour(s)).       Radiology Studies: No results found.      Scheduled Meds: . amiodarone  200 mg Oral Daily  . apixaban  5 mg Oral BID  . carvedilol  3.125 mg Oral BID WC  . dexamethasone (DECADRON) injection  6 mg  Intravenous Q24H  . guaiFENesin  600 mg Oral BID  . insulin aspart  0-15 Units Subcutaneous TID WC  . insulin aspart  0-5 Units Subcutaneous QHS  . rosuvastatin  10 mg Oral QHS   Continuous Infusions:   LOS: 4 days    Time spent: 25 minutes    Barb Merino, MD [facility] Pager [phone]

## 2019-08-02 LAB — COMPREHENSIVE METABOLIC PANEL
ALT: 25 U/L (ref 0–44)
AST: 21 U/L (ref 15–41)
Albumin: 3.4 g/dL — ABNORMAL LOW (ref 3.5–5.0)
Alkaline Phosphatase: 59 U/L (ref 38–126)
Anion gap: 11 (ref 5–15)
BUN: 33 mg/dL — ABNORMAL HIGH (ref 8–23)
CO2: 27 mmol/L (ref 22–32)
Calcium: 8.7 mg/dL — ABNORMAL LOW (ref 8.9–10.3)
Chloride: 98 mmol/L (ref 98–111)
Creatinine, Ser: 0.9 mg/dL (ref 0.61–1.24)
GFR calc Af Amer: >60 mL/min (ref >60)
GFR calc non Af Amer: >60 mL/min (ref >60)
Glucose, Bld: 152 mg/dL — ABNORMAL HIGH (ref 70–99)
Potassium: 4.1 mmol/L (ref 3.5–5.1)
Sodium: 136 mmol/L (ref 135–145)
Total Bilirubin: 1.7 mg/dL — ABNORMAL HIGH (ref 0.3–1.2)
Total Protein: 6.3 g/dL — ABNORMAL LOW (ref 6.5–8.1)

## 2019-08-02 LAB — CBC WITH DIFFERENTIAL/PLATELET
Abs Immature Granulocytes: 0.17 10*3/uL — ABNORMAL HIGH (ref 0.00–0.07)
Basophils Absolute: 0 10*3/uL (ref 0.0–0.1)
Basophils Relative: 0 %
Eosinophils Absolute: 0 10*3/uL (ref 0.0–0.5)
Eosinophils Relative: 0 %
HCT: 40.8 % (ref 39.0–52.0)
Hemoglobin: 13.6 g/dL (ref 13.0–17.0)
Immature Granulocytes: 1 %
Lymphocytes Relative: 11 %
Lymphs Abs: 1.4 10*3/uL (ref 0.7–4.0)
MCH: 31.3 pg (ref 26.0–34.0)
MCHC: 33.3 g/dL (ref 30.0–36.0)
MCV: 94 fL (ref 80.0–100.0)
Monocytes Absolute: 0.5 10*3/uL (ref 0.1–1.0)
Monocytes Relative: 4 %
Neutro Abs: 10.1 10*3/uL — ABNORMAL HIGH (ref 1.7–7.7)
Neutrophils Relative %: 84 %
Platelets: 184 10*3/uL (ref 150–400)
RBC: 4.34 MIL/uL (ref 4.22–5.81)
RDW: 12.8 % (ref 11.5–15.5)
WBC: 12.2 10*3/uL — ABNORMAL HIGH (ref 4.0–10.5)
nRBC: 0 % (ref 0.0–0.2)

## 2019-08-02 LAB — GLUCOSE, CAPILLARY
Glucose-Capillary: 193 mg/dL — ABNORMAL HIGH (ref 70–99)
Glucose-Capillary: 211 mg/dL — ABNORMAL HIGH (ref 70–99)

## 2019-08-02 LAB — C-REACTIVE PROTEIN: CRP: 1.9 mg/dL — ABNORMAL HIGH (ref <1.0)

## 2019-08-02 LAB — FERRITIN: Ferritin: 366 ng/mL — ABNORMAL HIGH (ref 24–336)

## 2019-08-02 LAB — D-DIMER, QUANTITATIVE: D-Dimer, Quant: 0.97 ug/mL-FEU — ABNORMAL HIGH (ref 0.00–0.50)

## 2019-08-02 MED ORDER — DEXAMETHASONE 6 MG PO TABS: 6.0000 mg | ORAL_TABLET | Freq: Every day | ORAL | 0 refills | Status: AC

## 2019-08-02 NOTE — Discharge Instructions (Signed)
Person Under Monitoring Name: Dylan [name]  Location: [place] [address]   Infection Prevention Recommendations for Individuals Confirmed to have, or Being Evaluated for, 2019 Novel Coronavirus (COVID-19) Infection Who Receive Care at Home  Individuals who are confirmed to have, or are being evaluated for, COVID-19 should follow the prevention steps below until a healthcare provider or local or state health department says they can return to normal activities.  Stay home except to get medical care You should restrict activities outside your home, except for getting medical care. Do not go to work, school, or public areas, and do not use public transportation or taxis.  Call ahead before visiting your doctor Before your medical appointment, call the healthcare provider and tell them that you have, or are being evaluated for, COVID-19 infection. This will help the healthcare providers office take steps to keep other people from getting infected. Ask your healthcare provider to call the local or state health department.  Monitor your symptoms Seek prompt medical attention if your illness is worsening (e.g., difficulty breathing). Before going to your medical appointment, call the healthcare provider and tell them that you have, or are being evaluated for, COVID-19 infection. Ask your healthcare provider to call the local or state health department.  Wear a facemask You should wear a facemask that covers your nose and mouth when you are in the same room with other people and when you visit a healthcare provider. People who live with or visit you should also wear a facemask while they are in the same room with you.  Separate yourself from other people in your home As much as possible, you should stay in a different room from other people in your home. Also, you should use a separate bathroom, if available.  Avoid sharing household items You should  not share dishes, drinking glasses, cups, eating utensils, towels, bedding, or other items with other people in your home. After using these items, you should wash them thoroughly with soap and water.  Cover your coughs and sneezes Cover your mouth and nose with a tissue when you cough or sneeze, or you can cough or sneeze into your sleeve. Throw used tissues in a lined trash can, and immediately wash your hands with soap and water for at least 20 seconds or use an alcohol-based hand rub.  Wash your [facility] your hands often and thoroughly with soap and water for at least 20 seconds. You can use an alcohol-based hand sanitizer if soap and water are not available and if your hands are not visibly dirty. Avoid touching your eyes, nose, and mouth with unwashed hands.   Prevention Steps for Caregivers and Household Members of Individuals Confirmed to have, or Being Evaluated for, COVID-19 Infection Being Cared for in the Home  If you live with, or provide care at home for, a person confirmed to have, or being evaluated for, COVID-19 infection please follow these guidelines to prevent infection:  Follow healthcare providers instructions Make sure that you understand and can help the patient follow any healthcare provider instructions for all care.  Provide for the patients basic needs You should help the patient with basic needs in the home and provide support for getting groceries, prescriptions, and other personal needs.  Monitor the patients symptoms If they are getting sicker, call his or her medical provider and tell them that the patient has, or is being evaluated for, COVID-19 infection. This will help the healthcare providers  office take steps to keep other people from getting infected. Ask the healthcare provider to call the local or state health department.  Limit the number of people who have contact with the patient  If possible, have only one caregiver for the  patient.  Other household members should stay in another home or place of residence. If this is not possible, they should stay  in another room, or be separated from the patient as much as possible. Use a separate bathroom, if available.  Restrict visitors who do not have an essential need to be in the home.  Keep older adults, very young children, and other sick people away from the patient Keep older adults, very young children, and those who have compromised immune systems or chronic health conditions away from the patient. This includes people with chronic heart, lung, or kidney conditions, diabetes, and cancer.  Ensure good ventilation Make sure that shared spaces in the home have good air flow, such as from an air conditioner or an opened window, weather permitting.  Wash your hands often  Wash your hands often and thoroughly with soap and water for at least 20 seconds. You can use an alcohol based hand sanitizer if soap and water are not available and if your hands are not visibly dirty.  Avoid touching your eyes, nose, and mouth with unwashed hands.  Use disposable paper towels to dry your hands. If not available, use dedicated cloth towels and replace them when they become wet.  Wear a facemask and gloves  Wear a disposable facemask at all times in the room and gloves when you touch or have contact with the patients blood, body fluids, and/or secretions or excretions, such as sweat, saliva, sputum, nasal mucus, vomit, urine, or feces.  Ensure the mask fits over your nose and mouth tightly, and do not touch it during use.  Throw out disposable facemasks and gloves after using them. Do not reuse.  Wash your hands immediately after removing your facemask and gloves.  If your personal clothing becomes contaminated, carefully remove clothing and launder. Wash your hands after handling contaminated clothing.  Place all used disposable facemasks, gloves, and other waste in a lined  container before disposing them with other household waste.  Remove gloves and wash your hands immediately after handling these items.  Do not share dishes, glasses, or other household items with the patient  Avoid sharing household items. You should not share dishes, drinking glasses, cups, eating utensils, towels, bedding, or other items with a patient who is confirmed to have, or being evaluated for, COVID-19 infection.  After the person uses these items, you should wash them thoroughly with soap and water.  Wash laundry thoroughly  Immediately remove and wash clothes or bedding that have blood, body fluids, and/or secretions or excretions, such as sweat, saliva, sputum, nasal mucus, vomit, urine, or feces, on them.  Wear gloves when handling laundry from the patient.  Read and follow directions on labels of laundry or clothing items and detergent. In general, wash and dry with the warmest temperatures recommended on the label.  Clean all areas the individual has used often  Clean all touchable surfaces, such as counters, tabletops, doorknobs, bathroom fixtures, toilets, phones, keyboards, tablets, and bedside tables, every day. Also, clean any surfaces that may have blood, body fluids, and/or secretions or excretions on them.  Wear gloves when cleaning surfaces the patient has come in contact with.  Use a diluted bleach solution (e.g., dilute bleach with 1  part bleach and 10 parts water) or a household disinfectant with a label that says EPA-registered for coronaviruses. To make a bleach solution at home, add 1 tablespoon of bleach to 1 quart (4 cups) of water. For a larger supply, add  cup of bleach to 1 gallon (16 cups) of water.  Read labels of cleaning products and follow recommendations provided on product labels. Labels contain instructions for safe and effective use of the cleaning product including precautions you should take when applying the product, such as wearing gloves or  eye protection and making sure you have good ventilation during use of the product.  Remove gloves and wash hands immediately after cleaning.  Monitor yourself for signs and symptoms of illness Caregivers and household members are considered close contacts, should monitor their health, and will be asked to limit movement outside of the home to the extent possible. Follow the monitoring steps for close contacts listed on the symptom monitoring form.   ? If you have additional questions, contact your local health department or call the epidemiologist on call at [phone] (available 24/7). ? This guidance is subject to change. For the most up-to-date guidance from CDC, please refer to their website: https://www.taylor.biz/ COVID-19 is a respiratory infection that is caused by a virus called severe acute respiratory syndrome coronavirus 2 (SARS-CoV-2). The disease is also known as coronavirus disease or novel coronavirus. In some people, the virus may not cause any symptoms. In others, it may cause a serious infection. The infection can get worse quickly and can lead to complications, such as:  Pneumonia, or infection of the lungs.  Acute respiratory distress syndrome or ARDS. This is fluid build-up in the lungs.  Acute respiratory failure. This is a condition in which there is not enough oxygen passing from the lungs to the body.  Sepsis or septic shock. This is a serious bodily reaction to an infection.  Blood clotting problems.  Secondary infections due to bacteria or fungus. The virus that causes COVID-19 is contagious. This means that it can spread from person to person through droplets from coughs and sneezes (respiratory secretions). What are the causes? This illness is caused by a virus. You may catch the virus by:  Breathing in droplets from an infected person's cough or sneeze.  Touching something, like a table or a  doorknob, that was exposed to the virus (contaminated) and then touching your mouth, nose, or eyes. What increases the risk? Risk for infection You are more likely to be infected with this virus if you:  Live in or travel to an area with a COVID-19 outbreak.  Come in contact with a sick person who recently traveled to an area with a COVID-19 outbreak.  Provide care for or live with a person who is infected with COVID-19. Risk for serious illness You are more likely to become seriously ill from the virus if you:  Are 57 years of age or older.  Have a long-term disease that lowers your body's ability to fight infection (immunocompromised).  Live in a nursing home or long-term care facility.  Have a long-term (chronic) disease such as: ? Chronic lung disease, including chronic obstructive pulmonary disease or asthma ? Heart disease. ? Diabetes. ? Chronic kidney disease. ? Liver disease.  Are obese. What are the signs or symptoms? Symptoms of this condition can range from mild to severe. Symptoms may appear any time from 2 to 14 days after being exposed to the virus. They include:  A fever.  A cough.  Difficulty breathing.  Chills.  Muscle pains.  A sore throat.  Loss of taste or smell. Some people may also have stomach problems, such as nausea, vomiting, or diarrhea. Other people may not have any symptoms of COVID-19. How is this diagnosed? This condition may be diagnosed based on:  Your signs and symptoms, especially if: ? You live in an area with a COVID-19 outbreak. ? You recently traveled to or from an area where the virus is common. ? You provide care for or live with a person who was diagnosed with COVID-19.  A physical exam.  Lab tests, which may include: ? A nasal swab to take a sample of fluid from your nose. ? A throat swab to take a sample of fluid from your throat. ? A sample of mucus from your lungs (sputum). ? Blood tests.  Imaging tests, which  may include, X-rays, CT scan, or ultrasound. How is this treated? At present, there is no medicine to treat COVID-19. Medicines that treat other diseases are being used on a trial basis to see if they are effective against COVID-19. Your health care provider will talk with you about ways to treat your symptoms. For most people, the infection is mild and can be managed at home with rest, fluids, and over-the-counter medicines. Treatment for a serious infection usually takes places in a hospital intensive care unit (ICU). It may include one or more of the following treatments. These treatments are given until your symptoms improve.  Receiving fluids and medicines through an IV.  Supplemental oxygen. Extra oxygen is given through a tube in the nose, a face mask, or a hood.  Positioning you to lie on your stomach (prone position). This makes it easier for oxygen to get into the lungs.  Continuous positive airway pressure (CPAP) or bi-level positive airway pressure (BPAP) machine. This treatment uses mild air pressure to keep the airways open. A tube that is connected to a motor delivers oxygen to the body.  Ventilator. This treatment moves air into and out of the lungs by using a tube that is placed in your windpipe.  Tracheostomy. This is a procedure to create a hole in the neck so that a breathing tube can be inserted.  Extracorporeal membrane oxygenation (ECMO). This procedure gives the lungs a chance to recover by taking over the functions of the heart and lungs. It supplies oxygen to the body and removes carbon dioxide. Follow these instructions at home: Lifestyle  If you are sick, stay home except to get medical care. Your health care provider will tell you how long to stay home. Call your health care provider before you go for medical care.  Rest at home as told by your health care provider.  Do not use any products that contain nicotine or tobacco, such as cigarettes, e-cigarettes, and  chewing tobacco. If you need help quitting, ask your health care provider.  Return to your normal activities as told by your health care provider. Ask your health care provider what activities are safe for you. General instructions  Take over-the-counter and prescription medicines only as told by your health care provider.  Drink enough fluid to keep your urine pale yellow.  Keep all follow-up visits as told by your health care provider. This is important. How is this prevented?  There is no vaccine to help prevent COVID-19 infection. However, there are steps you can take to protect yourself and others from this virus. To protect yourself:  Do not travel to areas where COVID-19 is a risk. The areas where COVID-19 is reported change often. To identify high-risk areas and travel restrictions, check the CDC travel website: FatFares.com.br  If you live in, or must travel to, an area where COVID-19 is a risk, take precautions to avoid infection. ? Stay away from people who are sick. ? Wash your hands often with soap and water for 20 seconds. If soap and water are not available, use an alcohol-based hand sanitizer. ? Avoid touching your mouth, face, eyes, or nose. ? Avoid going out in public, follow guidance from your state and local health authorities. ? If you must go out in public, wear a cloth face covering or face mask. ? Disinfect objects and surfaces that are frequently touched every day. This may include:  Counters and tables.  Doorknobs and light switches.  Sinks and faucets.  Electronics, such as phones, remote controls, keyboards, computers, and tablets. To protect others: If you have symptoms of COVID-19, take steps to prevent the virus from spreading to others.  If you think you have a COVID-19 infection, contact your health care provider right away. Tell your health care team that you think you may have a COVID-19 infection.  Stay home. Leave your house only  to seek medical care. Do not use public transport.  Do not travel while you are sick.  Wash your hands often with soap and water for 20 seconds. If soap and water are not available, use alcohol-based hand sanitizer.  Stay away from other members of your household. Let healthy household members care for children and pets, if possible. If you have to care for children or pets, wash your hands often and wear a mask. If possible, stay in your own room, separate from others. Use a different bathroom.  Make sure that all people in your household wash their hands well and often.  Cough or sneeze into a tissue or your sleeve or elbow. Do not cough or sneeze into your hand or into the air.  Wear a cloth face covering or face mask. Where to find more information  Centers for Disease Control and Prevention: PurpleGadgets.be  World Health Organization: https://www.castaneda.info/ Contact a health care provider if:  You live in or have traveled to an area where COVID-19 is a risk and you have symptoms of the infection.  You have had contact with someone who has COVID-19 and you have symptoms of the infection. Get help right away if:  You have trouble breathing.  You have pain or pressure in your chest.  You have confusion.  You have bluish lips and fingernails.  You have difficulty waking from sleep.  You have symptoms that get worse. These symptoms may represent a serious problem that is an emergency. Do not wait to see if the symptoms will go away. Get medical help right away. Call your local emergency services (911 in the [place] Do not drive yourself to the hospital. Let the emergency medical personnel know if you think you have COVID-19. Summary  COVID-19 is a respiratory infection that is caused by a virus. It is also known as coronavirus disease or novel coronavirus. It can cause serious infections, such as pneumonia, acute respiratory distress  syndrome, acute respiratory failure, or sepsis.  The virus that causes COVID-19 is contagious. This means that it can spread from person to person through droplets from coughs and sneezes.  You are more likely to develop a serious illness if you are 65 years  of age or older, have a weak immunity, live in a nursing home, or have chronic disease.  There is no medicine to treat COVID-19. Your health care provider will talk with you about ways to treat your symptoms.  Take steps to protect yourself and others from infection. Wash your hands often and disinfect objects and surfaces that are frequently touched every day. Stay away from people who are sick and wear a mask if you are sick. This information is not intended to replace advice given to you by your health care provider. Make sure you discuss any questions you have with your health care provider. Document Released: 09/11/2018 Document Revised: 01/01/2019 Document Reviewed: 09/11/2018 Elsevier Patient Education  2020 [employer].

## 2019-08-02 NOTE — Discharge Summary (Signed)
Physician Discharge Summary  Dylan [name] [id number] DOB: 1925-10-21 DOA: 07/28/2019  PCP: Haywood Pao, MD  Admit date: 07/28/2019 Discharge date: 08/02/2019  Admitted From: home  Disposition:  Home   Recommendations for Outpatient Follow-up:  1. Follow up with PCP in 1-2 weeks 2. Please obtain BMP/CBC in one week   Home Health:NA  Equipment/Devices:NA   Discharge Condition:stable   CODE STATUS:DNR/DNR Diet recommendation: low carbohydrate diet   Brief/Interim Summary: 85 year-old male with history of PAF/ bradycardia with PPM, diastolic CHF, diet-controlled DM-2, HLD, HTN and lung nodule presenting with shortness of breath, cough and positive Covid test at PCP office on the day of admission. In ED, saturating in low 90s on RA but required up to 6 L to maintain saturation in low 90s later.CXR with septal changes of multilobar pneumonia. Admitted and started on remdesivir and Decadron and treated for COVID 19 pneumonia.   Acute respiratory failure with hypoxia due to COVID-19 pneumonia: Patient was treated with aggressive COVID-19 directed therapies including Decadron, remdesivir for 5 days, a dose of Actemra.  Patient did very good clinical recovery.  Now remains on room air even on ambulation with walker.  Symptomatically improved.  Patient is eager to go home.  He has finished active therapies. He will continue Decadron 6 mg by mouth for 5 more days.  Suggested home therapies monitoring, cough medications and ambulation.  Patient does have history of diet-controlled diabetes, blood sugars were expected to elevate with use of Decadron, however he is going to stop it next 5 days.  Not restarting on any new treatment.  Paroxysmal A. fib, sinus rhythm on amiodarone and Eliquis.  Patient remains a stable.  He has mildly elevated bilirubin with normal transaminase, currently no indication to stop his long-term therapies.  Suggested outpatient follow-up.  Discharge Diagnoses:   Principal Problem:   Pneumonia due to COVID-19 virus Active Problems:   Type 2 diabetes mellitus without complication (HCC)   Atrial fibrillation (HCC)   Chronic diastolic CHF (congestive heart failure) [facility])    Discharge Instructions  Discharge Instructions    Call MD for:  difficulty breathing, headache or visual disturbances   Complete by: As directed    Call MD for:  temperature >100.4   Complete by: As directed    Diet Carb Modified   Complete by: As directed    Discharge instructions   Complete by: As directed    Can use over-the-counter cough medications.  Tylenol as needed.   Increase activity slowly   Complete by: As directed      Allergies as of 08/02/2019      Reactions   Keflex [cephalexin] Hives, Other (See Comments)   Occurred in the 1980's      Medication List    TAKE these medications   acetaminophen 325 MG tablet Commonly known as: TYLENOL Take 650 mg by mouth every 6 (six) hours as needed for mild pain or headache.   amiodarone 200 MG tablet Commonly known as: PACERONE Take 1 tablet by mouth once daily   apixaban 5 MG Tabs tablet Commonly known as: Eliquis Take 1 tablet (5 mg total) by mouth 2 (two) times daily.   carvedilol 3.125 MG tablet Commonly known as: COREG Take 1 tablet (3.125 mg total) by mouth 2 (two) times daily with a meal.   dexamethasone 6 MG tablet Commonly known as: DECADRON Take 1 tablet (6 mg total) by mouth daily for 5 days.   Fish Oil 1200 MG Caps Take  1,200 mg by mouth 2 (two) times daily.   furosemide 20 MG tablet Commonly known as: LASIX Take 1 tablet (20 mg total) by mouth daily.   rosuvastatin 10 MG tablet Commonly known as: CRESTOR Take 1 tablet (10 mg total) by mouth daily.   vitamin B-12 1000 MCG tablet Commonly known as: CYANOCOBALAMIN Take 1,000 mcg by mouth daily.   Vitamin D 50 MCG (2000 UT) Caps Take 2,000 Units by mouth daily.   VITAMIN E PO Take 1 tablet by mouth daily.        Allergies  Allergen Reactions  . Keflex [Cephalexin] Hives and Other (See Comments)    Occurred in the 1980's    Consultations:  None   Procedures/Studies: DG Chest Portable 1 View  Result Date: 07/28/2019 CLINICAL DATA:  85 year old male with history of dyspnea. COVID-19 positive. EXAM: PORTABLE CHEST 1 VIEW COMPARISON:  Chest x-ray 10/14/2017. FINDINGS: Patchy areas of interstitial prominence an ill-defined opacities noted throughout the mid to lower lungs bilaterally, concerning for multilobar pneumonia. Lung volumes are low. No pleural effusions. No evidence of pulmonary edema. Mild cardiomegaly. Upper mediastinal contours are within normal limits. Aortic atherosclerosis. Left-sided pacemaker device in place with lead tips projecting over the expected location of the right atrium and right ventricle. IMPRESSION: 1. Subtle changes compatible with multilobar bilateral pneumonia most evident throughout the mid to lower lungs bilaterally. 2. Mild cardiomegaly. 3. Aortic atherosclerosis. Electronically Signed   By: Vinnie Langton M.D.   On: 07/28/2019 13:58      Subjective: Patient seen and examined.  No overnight events.  Has some cough, denies any shortness of breath.  At rest he has no symptoms.  He walked with a walker, had no symptoms but saturation were 89-90%.  He is eager to go home and heal.   Discharge Exam: Vitals:   08/02/19 0902 08/02/19 1111  BP: 138/72   Pulse: 71   Resp: 16   Temp: 98 F (36.7 C)   SpO2: 90% (!) 89%   Vitals:   08/02/19 0630 08/02/19 0900 08/02/19 0902 08/02/19 1111  BP: (!) 150/77 138/72 138/72   Pulse: 62 72 71   Resp: 18  16   Temp: 97.9 F (36.6 C)  98 F (36.7 C)   TempSrc: Oral  Oral   SpO2: 92%  90% (!) 89%  Weight:      Height:        General: Pt is alert, awake, not in acute distress Cardiovascular: RRR, S1/S2 +, no rubs, no gallops Respiratory: CTA bilaterally, no wheezing, no rhonchi.  Pacer right  precordium. Abdominal: Soft, NT, ND, bowel sounds + Extremities: no edema, no cyanosis    The results of significant diagnostics from this hospitalization (including imaging, microbiology, ancillary and laboratory) are listed below for reference.     Microbiology: No results found for this or any previous visit (from the past 240 hour(s)).   Labs: BNP (last 3 results) No results for input(s): BNP in the last 8760 hours. Basic Metabolic Panel: Recent Labs  Lab 07/28/19 1244 07/29/19 0356 07/30/19 0404 07/31/19 0326 08/02/19 0723  NA 132* 135 136 135 136  K 3.7 3.4* 4.3 4.0 4.1  CL 95* 100 101 100 98  CO2 24 24 25 24 27   GLUCOSE 92 189* 189* 155* 152*  BUN 14 12 20  25* 33*  CREATININE 1.01 0.82 0.86 0.82 0.90  CALCIUM 8.4* 8.5* 9.1 8.8* 8.7*   Liver Function Tests: Recent Labs  Lab 07/29/19 0356 07/30/19  0404 07/31/19 0326 08/02/19 0723  AST 27 30 23 21   ALT 27 26 21 25   ALKPHOS 61 52 53 59  BILITOT 2.0* 1.7* 2.1* 1.7*  PROT 6.4* 6.5 6.2* 6.3*  ALBUMIN 3.4* 3.3* 3.3* 3.4*   No results for input(s): LIPASE, AMYLASE in the last 168 hours. No results for input(s): AMMONIA in the last 168 hours. CBC: Recent Labs  Lab 07/28/19 1244 07/29/19 0356 07/30/19 0404 07/31/19 0326 08/02/19 0723  WBC 5.1 5.2 12.1* 14.9* 12.2*  NEUTROABS 3.5 4.4 10.9* 13.4* 10.1*  HGB 13.5 13.2 13.0 12.8* 13.6  HCT 41.0 39.5 40.1 39.0 40.8  MCV 96.9 96.1 97.3 97.5 94.0  PLT 104* 89* 120* 137* 184   Cardiac Enzymes: No results for input(s): CKTOTAL, CKMB, CKMBINDEX, TROPONINI in the last 168 hours. BNP: Invalid input(s): POCBNP CBG: Recent Labs  Lab 08/01/19 0730 08/01/19 1145 08/01/19 1633 08/01/19 2210 08/02/19 0753  GLUCAP 142* 160* 176* 106* 193*   D-Dimer Recent Labs    07/31/19 0326 08/02/19 0723  DDIMER 1.14* 0.97*   Hgb A1c No results for input(s): HGBA1C in the last 72 hours. Lipid Profile No results for input(s): CHOL, HDL, LDLCALC, TRIG, CHOLHDL,  LDLDIRECT in the last 72 hours. Thyroid function studies No results for input(s): TSH, T4TOTAL, T3FREE, THYROIDAB in the last 72 hours.  Invalid input(s): FREET3 Anemia work up Recent Labs    07/31/19 0326 08/02/19 0723  FERRITIN 470* 366*   Urinalysis    Component Value Date/Time   COLORURINE YELLOW 07/28/2019 [place] 07/28/2019 1244   LABSPEC 1.011 07/28/2019 1244   PHURINE 5.0 07/28/2019 1244   GLUCOSEU NEGATIVE 07/28/2019 1244   HGBUR SMALL (A) 07/28/2019 [place] 07/28/2019 1244   KETONESUR NEGATIVE 07/28/2019 1244   PROTEINUR NEGATIVE 07/28/2019 1244   UROBILINOGEN 1.0 11/22/2009 1423   NITRITE NEGATIVE 07/28/2019 1244   LEUKOCYTESUR NEGATIVE 07/28/2019 1244   Sepsis Labs Invalid input(s): PROCALCITONIN,  WBC,  LACTICIDVEN Microbiology No results found for this or any previous visit (from the past 240 hour(s)).   Time coordinating discharge:  40 minutes  SIGNED:   Barb Merino, MD  [facility] 08/02/2019, 11:55 AM

## 2019-08-02 NOTE — Progress Notes (Signed)
Patient Saturations on Room Air at Rest = 90%  Patient Saturations on [employer] while Ambulating = 89%

## 2019-08-03 ENCOUNTER — Encounter (HOSPITAL_COMMUNITY): Payer: Self-pay

## 2019-08-05 DIAGNOSIS — E669 Obesity, unspecified: Secondary | ICD-10-CM | POA: Diagnosis not present

## 2019-08-05 DIAGNOSIS — I5032 Chronic diastolic (congestive) heart failure: Secondary | ICD-10-CM | POA: Diagnosis not present

## 2019-08-05 DIAGNOSIS — J189 Pneumonia, unspecified organism: Secondary | ICD-10-CM | POA: Diagnosis not present

## 2019-08-05 DIAGNOSIS — I110 Hypertensive heart disease with heart failure: Secondary | ICD-10-CM | POA: Diagnosis not present

## 2019-08-05 DIAGNOSIS — I480 Paroxysmal atrial fibrillation: Secondary | ICD-10-CM | POA: Diagnosis not present

## 2019-08-05 DIAGNOSIS — U071 COVID-19: Secondary | ICD-10-CM | POA: Diagnosis not present

## 2019-08-05 DIAGNOSIS — E7800 Pure hypercholesterolemia, unspecified: Secondary | ICD-10-CM | POA: Diagnosis not present

## 2019-08-05 DIAGNOSIS — Z7901 Long term (current) use of anticoagulants: Secondary | ICD-10-CM | POA: Diagnosis not present

## 2019-08-05 DIAGNOSIS — Z950 Presence of cardiac pacemaker: Secondary | ICD-10-CM | POA: Diagnosis not present

## 2019-08-05 DIAGNOSIS — E1151 Type 2 diabetes mellitus with diabetic peripheral angiopathy without gangrene: Secondary | ICD-10-CM | POA: Diagnosis not present

## 2019-08-09 ENCOUNTER — Emergency Department (HOSPITAL_COMMUNITY): Payer: Medicare HMO

## 2019-08-09 ENCOUNTER — Encounter (HOSPITAL_COMMUNITY): Payer: Self-pay | Admitting: Emergency Medicine

## 2019-08-09 ENCOUNTER — Inpatient Hospital Stay (HOSPITAL_COMMUNITY)
Admission: EM | Admit: 2019-08-09 | Discharge: 2019-08-17 | DRG: 177 | Disposition: A | Discharge status: Skilled Nursing Facility | Payer: Medicare HMO | Attending: Internal Medicine | Admitting: Internal Medicine

## 2019-08-09 ENCOUNTER — Other Ambulatory Visit: Payer: Self-pay

## 2019-08-09 DIAGNOSIS — Z9049 Acquired absence of other specified parts of digestive tract: Secondary | ICD-10-CM | POA: Diagnosis not present

## 2019-08-09 DIAGNOSIS — E119 Type 2 diabetes mellitus without complications: Secondary | ICD-10-CM | POA: Diagnosis not present

## 2019-08-09 DIAGNOSIS — R296 Repeated falls: Secondary | ICD-10-CM | POA: Diagnosis present

## 2019-08-09 DIAGNOSIS — Z881 Allergy status to other antibiotic agents status: Secondary | ICD-10-CM

## 2019-08-09 DIAGNOSIS — Z79899 Other long term (current) drug therapy: Secondary | ICD-10-CM | POA: Diagnosis not present

## 2019-08-09 DIAGNOSIS — J1289 Other viral pneumonia: Secondary | ICD-10-CM | POA: Diagnosis present

## 2019-08-09 DIAGNOSIS — R531 Weakness: Secondary | ICD-10-CM | POA: Diagnosis not present

## 2019-08-09 DIAGNOSIS — U071 COVID-19: Secondary | ICD-10-CM | POA: Diagnosis not present

## 2019-08-09 DIAGNOSIS — Z7901 Long term (current) use of anticoagulants: Secondary | ICD-10-CM

## 2019-08-09 DIAGNOSIS — Z87891 Personal history of nicotine dependence: Secondary | ICD-10-CM

## 2019-08-09 DIAGNOSIS — Z950 Presence of cardiac pacemaker: Secondary | ICD-10-CM

## 2019-08-09 DIAGNOSIS — I110 Hypertensive heart disease with heart failure: Secondary | ICD-10-CM | POA: Diagnosis present

## 2019-08-09 DIAGNOSIS — J9601 Acute respiratory failure with hypoxia: Secondary | ICD-10-CM | POA: Diagnosis present

## 2019-08-09 DIAGNOSIS — I480 Paroxysmal atrial fibrillation: Secondary | ICD-10-CM | POA: Diagnosis present

## 2019-08-09 DIAGNOSIS — I951 Orthostatic hypotension: Secondary | ICD-10-CM | POA: Diagnosis not present

## 2019-08-09 DIAGNOSIS — Z823 Family history of stroke: Secondary | ICD-10-CM

## 2019-08-09 DIAGNOSIS — R0902 Hypoxemia: Secondary | ICD-10-CM

## 2019-08-09 DIAGNOSIS — E785 Hyperlipidemia, unspecified: Secondary | ICD-10-CM | POA: Diagnosis not present

## 2019-08-09 DIAGNOSIS — Z7401 Bed confinement status: Secondary | ICD-10-CM | POA: Diagnosis not present

## 2019-08-09 DIAGNOSIS — I4891 Unspecified atrial fibrillation: Secondary | ICD-10-CM | POA: Diagnosis present

## 2019-08-09 DIAGNOSIS — I10 Essential (primary) hypertension: Secondary | ICD-10-CM | POA: Diagnosis not present

## 2019-08-09 DIAGNOSIS — Z8249 Family history of ischemic heart disease and other diseases of the circulatory system: Secondary | ICD-10-CM

## 2019-08-09 DIAGNOSIS — H9190 Unspecified hearing loss, unspecified ear: Secondary | ICD-10-CM | POA: Diagnosis present

## 2019-08-09 DIAGNOSIS — I5032 Chronic diastolic (congestive) heart failure: Secondary | ICD-10-CM | POA: Diagnosis not present

## 2019-08-09 DIAGNOSIS — R911 Solitary pulmonary nodule: Secondary | ICD-10-CM | POA: Diagnosis present

## 2019-08-09 DIAGNOSIS — Z9981 Dependence on supplemental oxygen: Secondary | ICD-10-CM

## 2019-08-09 DIAGNOSIS — M2550 Pain in unspecified joint: Secondary | ICD-10-CM | POA: Diagnosis not present

## 2019-08-09 DIAGNOSIS — Z66 Do not resuscitate: Secondary | ICD-10-CM | POA: Diagnosis not present

## 2019-08-09 DIAGNOSIS — I482 Chronic atrial fibrillation, unspecified: Secondary | ICD-10-CM | POA: Diagnosis not present

## 2019-08-09 DIAGNOSIS — R05 Cough: Secondary | ICD-10-CM | POA: Diagnosis not present

## 2019-08-09 DIAGNOSIS — J1282 Pneumonia due to coronavirus disease 2019: Secondary | ICD-10-CM

## 2019-08-09 DIAGNOSIS — M6281 Muscle weakness (generalized): Secondary | ICD-10-CM | POA: Diagnosis not present

## 2019-08-09 DIAGNOSIS — Z209 Contact with and (suspected) exposure to unspecified communicable disease: Secondary | ICD-10-CM | POA: Diagnosis not present

## 2019-08-09 HISTORY — DX: Acute myocardial infarction, unspecified: I21.9

## 2019-08-09 HISTORY — DX: Presence of cardiac pacemaker: Z95.0

## 2019-08-09 HISTORY — DX: Dyspnea, unspecified: R06.00

## 2019-08-09 HISTORY — DX: COVID-19: U07.1

## 2019-08-09 HISTORY — DX: Type 2 diabetes mellitus without complications: E11.9

## 2019-08-09 HISTORY — DX: Personal history of other diseases of the digestive system: Z87.19

## 2019-08-09 LAB — CBC WITH DIFFERENTIAL/PLATELET
Abs Immature Granulocytes: 0.11 10*3/uL — ABNORMAL HIGH (ref 0.00–0.07)
Basophils Absolute: 0 10*3/uL (ref 0.0–0.1)
Basophils Relative: 0 %
Eosinophils Absolute: 0.1 10*3/uL (ref 0.0–0.5)
Eosinophils Relative: 0 %
HCT: 45.6 % (ref 39.0–52.0)
Hemoglobin: 15.6 g/dL (ref 13.0–17.0)
Immature Granulocytes: 1 %
Lymphocytes Relative: 4 %
Lymphs Abs: 0.8 10*3/uL (ref 0.7–4.0)
MCH: 32 pg (ref 26.0–34.0)
MCHC: 34.2 g/dL (ref 30.0–36.0)
MCV: 93.6 fL (ref 80.0–100.0)
Monocytes Absolute: 0.4 10*3/uL (ref 0.1–1.0)
Monocytes Relative: 2 %
Neutro Abs: 16.9 10*3/uL — ABNORMAL HIGH (ref 1.7–7.7)
Neutrophils Relative %: 93 %
Platelets: DECREASED 10*3/uL (ref 150–400)
RBC: 4.87 MIL/uL (ref 4.22–5.81)
RDW: 12.9 % (ref 11.5–15.5)
WBC: 18.3 10*3/uL — ABNORMAL HIGH (ref 4.0–10.5)
nRBC: 0 % (ref 0.0–0.2)

## 2019-08-09 LAB — LACTATE DEHYDROGENASE: LDH: 261 U/L — ABNORMAL HIGH (ref 98–192)

## 2019-08-09 LAB — COMPREHENSIVE METABOLIC PANEL
ALT: 24 U/L (ref 0–44)
AST: 23 U/L (ref 15–41)
Albumin: 3.1 g/dL — ABNORMAL LOW (ref 3.5–5.0)
Alkaline Phosphatase: 63 U/L (ref 38–126)
Anion gap: 10 (ref 5–15)
BUN: 27 mg/dL — ABNORMAL HIGH (ref 8–23)
CO2: 23 mmol/L (ref 22–32)
Calcium: 8.4 mg/dL — ABNORMAL LOW (ref 8.9–10.3)
Chloride: 100 mmol/L (ref 98–111)
Creatinine, Ser: 1.1 mg/dL (ref 0.61–1.24)
GFR calc Af Amer: >60 mL/min (ref >60)
GFR calc non Af Amer: 58 mL/min — ABNORMAL LOW (ref >60)
Glucose, Bld: 95 mg/dL (ref 70–99)
Potassium: 4.1 mmol/L (ref 3.5–5.1)
Sodium: 133 mmol/L — ABNORMAL LOW (ref 135–145)
Total Bilirubin: 1.8 mg/dL — ABNORMAL HIGH (ref 0.3–1.2)
Total Protein: 5.8 g/dL — ABNORMAL LOW (ref 6.5–8.1)

## 2019-08-09 LAB — GLUCOSE, CAPILLARY: Glucose-Capillary: 122 mg/dL — ABNORMAL HIGH (ref 70–99)

## 2019-08-09 LAB — CBG MONITORING, ED: Glucose-Capillary: 105 mg/dL — ABNORMAL HIGH (ref 70–99)

## 2019-08-09 LAB — PROCALCITONIN: Procalcitonin: <0.1 ng/mL

## 2019-08-09 LAB — C-REACTIVE PROTEIN: CRP: 0.7 mg/dL (ref <1.0)

## 2019-08-09 LAB — LACTIC ACID, PLASMA: Lactic Acid, Venous: 2 mmol/L (ref 0.5–1.9)

## 2019-08-09 LAB — FERRITIN: Ferritin: 484 ng/mL — ABNORMAL HIGH (ref 24–336)

## 2019-08-09 LAB — D-DIMER, QUANTITATIVE: D-Dimer, Quant: 0.95 ug/mL-FEU — ABNORMAL HIGH (ref 0.00–0.50)

## 2019-08-09 LAB — TRIGLYCERIDES: Triglycerides: 116 mg/dL (ref <150)

## 2019-08-09 LAB — FIBRINOGEN: Fibrinogen: 330 mg/dL (ref 210–475)

## 2019-08-09 MED ORDER — AMIODARONE HCL 100 MG PO TABS
200.0000 mg | ORAL_TABLET | Freq: Every day | ORAL | Status: DC
  Administered 2019-08-09 – 2019-08-17 (×9): 200 mg via ORAL
  Filled 2019-08-09 (×8): qty 2
  Filled 2019-08-09: qty 1
  Filled 2019-08-09: qty 2

## 2019-08-09 MED ORDER — BISACODYL 5 MG PO TBEC: 5.0000 mg | DELAYED_RELEASE_TABLET | Freq: Every day | ORAL | Status: DC | PRN

## 2019-08-09 MED ORDER — SODIUM CHLORIDE 0.9 % IV SOLN
2.0000 g | Freq: Three times a day (TID) | INTRAVENOUS | Status: DC
  Filled 2019-08-09: qty 2

## 2019-08-09 MED ORDER — ROSUVASTATIN CALCIUM 5 MG PO TABS
10.0000 mg | ORAL_TABLET | Freq: Every day | ORAL | Status: DC
  Administered 2019-08-09 – 2019-08-17 (×9): 10 mg via ORAL
  Filled 2019-08-09 (×9): qty 2

## 2019-08-09 MED ORDER — INSULIN ASPART 100 UNIT/ML ~~LOC~~ SOLN: 0.0000 [IU] | Freq: Three times a day (TID) | SUBCUTANEOUS | Status: DC

## 2019-08-09 MED ORDER — POLYETHYLENE GLYCOL 3350 17 G PO PACK: 17.0000 g | PACK | Freq: Every day | ORAL | Status: DC | PRN

## 2019-08-09 MED ORDER — HYDROCOD POLST-CPM POLST ER 10-8 MG/5ML PO SUER
5.0000 mL | Freq: Two times a day (BID) | ORAL | Status: DC | PRN
  Administered 2019-08-12: 5 mL via ORAL
  Filled 2019-08-09: qty 5

## 2019-08-09 MED ORDER — APIXABAN 5 MG PO TABS
5.0000 mg | ORAL_TABLET | Freq: Two times a day (BID) | ORAL | Status: DC
  Administered 2019-08-09 – 2019-08-17 (×16): 5 mg via ORAL
  Filled 2019-08-09 (×18): qty 1

## 2019-08-09 MED ORDER — SODIUM CHLORIDE 0.9% FLUSH
3.0000 mL | Freq: Two times a day (BID) | INTRAVENOUS | Status: DC
  Administered 2019-08-09 – 2019-08-17 (×16): 3 mL via INTRAVENOUS

## 2019-08-09 MED ORDER — ACETAMINOPHEN 325 MG PO TABS: 650.0000 mg | ORAL_TABLET | Freq: Four times a day (QID) | ORAL | Status: DC | PRN

## 2019-08-09 MED ORDER — ALBUTEROL SULFATE HFA 108 (90 BASE) MCG/ACT IN AERS
2.0000 | INHALATION_SPRAY | Freq: Four times a day (QID) | RESPIRATORY_TRACT | Status: DC
  Administered 2019-08-09 – 2019-08-17 (×30): 2 via RESPIRATORY_TRACT
  Filled 2019-08-09: qty 6.7

## 2019-08-09 MED ORDER — LACTATED RINGERS IV SOLN: INTRAVENOUS | Status: DC

## 2019-08-09 MED ORDER — FLEET ENEMA 7-19 GM/118ML RE ENEM: 1.0000 | ENEMA | Freq: Once | RECTAL | Status: DC | PRN

## 2019-08-09 MED ORDER — ONDANSETRON HCL 4 MG PO TABS: 4.0000 mg | ORAL_TABLET | Freq: Four times a day (QID) | ORAL | Status: DC | PRN

## 2019-08-09 MED ORDER — ONDANSETRON HCL 4 MG/2ML IJ SOLN: 4.0000 mg | Freq: Four times a day (QID) | INTRAMUSCULAR | Status: DC | PRN

## 2019-08-09 MED ORDER — SODIUM CHLORIDE 0.9 % IV SOLN
2.0000 g | Freq: Once | INTRAVENOUS | Status: AC
  Administered 2019-08-09: 2 g via INTRAVENOUS
  Filled 2019-08-09: qty 2

## 2019-08-09 MED ORDER — SODIUM CHLORIDE 0.9% FLUSH: 3.0000 mL | INTRAVENOUS | Status: DC | PRN

## 2019-08-09 MED ORDER — CARVEDILOL 3.125 MG PO TABS
3.1250 mg | ORAL_TABLET | Freq: Two times a day (BID) | ORAL | Status: DC
  Administered 2019-08-09 – 2019-08-11 (×5): 3.125 mg via ORAL
  Filled 2019-08-09 (×5): qty 1

## 2019-08-09 MED ORDER — SODIUM CHLORIDE 0.9 % IV SOLN: 250.0000 mL | INTRAVENOUS | Status: DC | PRN

## 2019-08-09 MED ORDER — VANCOMYCIN HCL 2000 MG/400ML IV SOLN
2000.0000 mg | Freq: Once | INTRAVENOUS | Status: AC
  Administered 2019-08-09: 2000 mg via INTRAVENOUS
  Filled 2019-08-09: qty 400

## 2019-08-09 MED ORDER — GUAIFENESIN-DM 100-10 MG/5ML PO SYRP
10.0000 mL | ORAL_SOLUTION | ORAL | Status: DC | PRN
  Administered 2019-08-09: 10 mL via ORAL
  Filled 2019-08-09: qty 10

## 2019-08-09 MED ORDER — VANCOMYCIN HCL 1250 MG/250ML IV SOLN: 1250.0000 mg | INTRAVENOUS | Status: DC

## 2019-08-09 MED ORDER — OXYCODONE HCL 5 MG PO TABS: 5.0000 mg | ORAL_TABLET | ORAL | Status: DC | PRN

## 2019-08-09 NOTE — ED Provider Notes (Signed)
[facility] EMERGENCY DEPARTMENT Provider Note   CSN: [id number] Arrival date & time:        History Chief Complaint  Patient presents with  . Covid/weakness  . Cough    Dylan [name] is a 85 y.o. male.  The history is provided by the patient and medical records. No language interpreter was used.  Weakness Associated symptoms: cough   Cough  Dylan [name] is a 85 y.o. male who presents to the Emergency Department complaining of weakness and cough. Dylan [name] is presenting from home for evaluation of one week of progressive weakness, frequent falls, coughing and shortness of breath. He is experienced 3 to 4 falls over the last several days. He does not believe he hit his head but is not certain. He is on eliquis. He reports that he has persistent cough that is nonproductive. He denies any fevers, chest pain, abdominal pain, leg swelling or pain. He was admitted to the hospital on December 8 and discharged on the 13th for COVID 19 pneumonia with increased oxygen requirement. Symptoms are moderate to severe, constant, worsening. He does feel like he is weaker in his right leg and this is part of his problem with walking. He is unsure how long he is been feeling weaker in this leg.    Past Medical History:  Diagnosis Date  . COVID-19   . Hyperglycemia   . Hyperlipidemia   . Hypertension   . Lung nodule     Patient Active Problem List   Diagnosis Date Noted  . Chronic diastolic CHF (congestive heart failure) ([place]) 07/28/2019  . Pneumonia due to COVID-19 virus 07/28/2019  . Periorbital hematoma of left eye 10/15/2017  . Fall 10/15/2017  . HCAP (healthcare-associated pneumonia) 10/14/2017  . Bradycardia 10/03/2017  . Acute CHF (congestive heart failure) ([place]) 08/03/2017  . Atrial fibrillation ([place]) 07/14/2017  . Diastolic dysfunction with acute on chronic heart failure ([place]) 07/14/2017  . DOE (dyspnea on exertion) 08/24/2014  . Benign essential HTN  08/24/2014  . Type 2 diabetes mellitus without complication ([place]) 99991111  . Dyslipidemia 08/24/2014    Past Surgical History:  Procedure Laterality Date  . APPENDECTOMY  1956  . BACK SURGERY  2008  . CARDIAC CATHETERIZATION  1995   negative results  . CARDIOVERSION N/A 08/05/2017   Procedure: CARDIOVERSION;  Surgeon: Larey Dresser, MD;  Location: [facility] ENDOSCOPY;  Service: Cardiovascular;  Laterality: N/A;  . CARDIOVERSION N/A 09/12/2017   Procedure: CARDIOVERSION;  Surgeon: Larey Dresser, MD;  Location: [facility] ENDOSCOPY;  Service: Cardiovascular;  Laterality: N/A;  . COLON SURGERY  07/09/12  . [place]  . HOT HEMOSTASIS  01/08/2012   Procedure: HOT HEMOSTASIS (ARGON PLASMA COAGULATION/BICAP);  Surgeon: Arta Silence, MD;  Location: [facility] ENDOSCOPY;  Service: Endoscopy;  Laterality: N/A;  . LAPAROSCOPIC ILEOCECECTOMY  07/09/2012   Procedure: LAPAROSCOPIC ILEOCECECTOMY;  Surgeon: Edward Jolly, MD;  Location: [facility] ORS;  Service: General;  Laterality: N/A;  Laparoscopic Ileocecectomy  . PACEMAKER IMPLANT N/A 10/05/2017   Procedure: PACEMAKER IMPLANT;  Surgeon: Deboraha Sprang, MD;  Location: [place] [facility] CV LAB;  Service: Cardiovascular;  Laterality: N/A;  . REPLACEMENT TOTAL KNEE  2010  . TEE WITHOUT CARDIOVERSION N/A 08/05/2017   Procedure: TRANSESOPHAGEAL ECHOCARDIOGRAM (TEE);  Surgeon: Larey Dresser, MD;  Location: [facility] ENDOSCOPY;  Service: Cardiovascular;  Laterality: N/A;  . TEE WITHOUT CARDIOVERSION N/A 09/12/2017   Procedure: TRANSESOPHAGEAL ECHOCARDIOGRAM (TEE);  Surgeon: Larey Dresser, MD;  Location: [facility]  ENDOSCOPY;  Service: Cardiovascular;  Laterality: N/A;       Family History  Problem Relation Age of Onset  . Cancer - Other Mother   . CVA Father   . Heart attack Brother   . Heart attack Maternal Grandmother     Social History   Tobacco Use  . Smoking status: Former Smoker    Packs/day: 1.00    Types: Cigarettes    Quit date: 01/07/1992    Years  since quitting: 27.6  . Smokeless tobacco: Never Used  Substance Use Topics  . Alcohol use: Yes    Alcohol/week: 2.0 standard drinks    Types: 2 Glasses of wine per week  . Drug use: No    Home Medications Prior to Admission medications   Medication Sig Start Date End Date Taking? Authorizing Provider  acetaminophen (TYLENOL) 325 MG tablet Take 650 mg by mouth every 6 (six) hours as needed for mild pain or headache.    Yes [provider]  amiodarone (PACERONE) 200 MG tablet Take 1 tablet by mouth once daily Patient taking differently: Take 200 mg by mouth daily.  12/09/18  Yes Larey Dresser, MD  apixaban (ELIQUIS) 5 MG TABS tablet Take 1 tablet (5 mg total) by mouth 2 (two) times daily. 05/11/19  Yes Larey Dresser, MD  carvedilol (COREG) 3.125 MG tablet Take 1 tablet (3.125 mg total) by mouth 2 (two) times daily with a meal. 09/17/17  Yes Larey Dresser, MD  Cholecalciferol (VITAMIN D) 2000 units CAPS Take 2,000 Units by mouth daily.    Yes [provider]  furosemide (LASIX) 20 MG tablet Take 1 tablet (20 mg total) by mouth daily. 10/14/17  Yes Larey Dresser, MD  Omega-3 Fatty Acids (FISH OIL) 1200 MG CAPS Take 1,200 mg by mouth 2 (two) times daily.    Yes [provider]  rosuvastatin (CRESTOR) 10 MG tablet Take 1 tablet (10 mg total) by mouth daily. 03/13/19 08/09/19 Yes Larey Dresser, MD  vitamin B-12 (CYANOCOBALAMIN) 1000 MCG tablet Take 1,000 mcg by mouth daily.   Yes [provider]  VITAMIN E PO Take 1 tablet by mouth daily.   Yes [provider]    Allergies    Keflex [cephalexin]  Review of Systems   Review of Systems  Respiratory: Positive for cough.   Neurological: Positive for weakness.  All other systems reviewed and are negative.   Physical Exam Updated Vital Signs BP (!) 106/51   Pulse 60   Temp 97.8 F (36.6 C) (Oral)   Resp 17   SpO2 98%   Physical Exam Vitals and nursing note reviewed.    Constitutional:      Appearance: He is well-developed.  HENT:     Head: Normocephalic and atraumatic.  Cardiovascular:     Rate and Rhythm: Normal rate and regular rhythm.     Heart sounds: No murmur.  Pulmonary:     Effort: Pulmonary effort is normal. No respiratory distress.     Comments: Occasional rhonchi Abdominal:     Palpations: Abdomen is soft.     Tenderness: There is no abdominal tenderness. There is no guarding or rebound.  Musculoskeletal:        General: No tenderness.     Comments: Trace edema to bilateral lower extremities, left upper extremity  Skin:    General: Skin is warm and dry.  Neurological:     Mental Status: He is alert and oriented to person, place, and  time.     Comments: Hard of hearing. 4+ out of five strength in all four extremities with sensation to light touch intact in all four extremities  Psychiatric:        Behavior: Behavior normal.     ED Results / Procedures / Treatments   Labs (all labs ordered are listed, but only abnormal results are displayed) Labs Reviewed  LACTIC ACID, PLASMA - Abnormal; Notable for the following components:      Result Value   Lactic Acid, Venous 2.0 (*)    All other components within normal limits  CBC WITH DIFFERENTIAL/PLATELET - Abnormal; Notable for the following components:   WBC 18.3 (*)    Neutro Abs 16.9 (*)    Abs Immature Granulocytes 0.11 (*)    All other components within normal limits  COMPREHENSIVE METABOLIC PANEL - Abnormal; Notable for the following components:   Sodium 133 (*)    BUN 27 (*)    Calcium 8.4 (*)    Total Protein 5.8 (*)    Albumin 3.1 (*)    Total Bilirubin 1.8 (*)    GFR calc non Af Amer 58 (*)    All other components within normal limits  D-DIMER, QUANTITATIVE (NOT AT [facility]) - Abnormal; Notable for the following components:   D-Dimer, Quant 0.95 (*)    All other components within normal limits  LACTATE DEHYDROGENASE - Abnormal; Notable for the following components:    LDH 261 (*)    All other components within normal limits  FERRITIN - Abnormal; Notable for the following components:   Ferritin 484 (*)    All other components within normal limits  CULTURE, BLOOD (ROUTINE X 2)  CULTURE, BLOOD (ROUTINE X 2)  PROCALCITONIN  FIBRINOGEN  C-REACTIVE PROTEIN  TRIGLYCERIDES    EKG EKG Interpretation  Date/Time:  Sunday August 09 2019 08:17:01 EST Ventricular Rate:  76 PR Interval:    QRS Duration: 104 QT Interval:  442 QTC Calculation: 497 R Axis:   27 Text Interpretation: Ectopic atrial rhythm Borderline prolonged QT interval Confirmed by Quintella Reichert [phone]) on 08/09/2019 8:47:53 AM   Radiology CT Head Wo Contrast  Result Date: 08/09/2019 CLINICAL DATA:  Frequent falls for the past week. On Eliquis. Right leg weakness. EXAM: CT HEAD WITHOUT CONTRAST TECHNIQUE: Contiguous axial images were obtained from the base of the skull through the vertex without intravenous contrast. COMPARISON:  CT head dated July 09, 2018. FINDINGS: Brain: No evidence of acute infarction, hemorrhage, hydrocephalus, extra-axial collection or mass lesion/mass effect. Stable atrophy and chronic microvascular ischemic changes. Old small lacunar infarct in the right thalamus again noted. Vascular: Calcified atherosclerosis at the skullbase. No hyperdense vessel. Skull: Normal. Negative for fracture or focal lesion. Sinuses/Orbits: No acute finding.  Prior right mastoidectomy. Other: None. IMPRESSION: 1.  No acute intracranial abnormality. Electronically Signed   By: Titus Dubin M.D.   On: 08/09/2019 09:56   DG Chest Port 1 View  Result Date: 08/09/2019 CLINICAL DATA:  COVID-19 infection progressive cough, weakness and hypoxia. EXAM: PORTABLE CHEST 1 VIEW COMPARISON:  07/28/2019 FINDINGS: Cardiomediastinal contours are stable, dual lead left-sided pacer device in place power pack projecting over left chest. Lung volumes are diminished compared to the previous exam with  patchy bilateral basilar opacities. No signs of dense consolidation or evidence of pleural effusion. No acute bone process. IMPRESSION: Low lung volumes with patchy bilateral basilar opacities, new since the previous exam. Sequela of viral/atypical infection is considered. Electronically Signed   By: Cay Schillings  Wile M.D.   On: 08/09/2019 09:28    Procedures Procedures (including critical care time)  Medications Ordered in ED Medications  vancomycin (VANCOREADY) IVPB 1250 mg/250 mL (has no administration in time range)  aztreonam (AZACTAM) 2 g in sodium chloride 0.9 % 100 mL IVPB (has no administration in time range)  amiodarone (PACERONE) tablet 200 mg (200 mg Oral Given 08/09/19 1205)  carvedilol (COREG) tablet 3.125 mg (3.125 mg Oral Given 08/09/19 1334)  rosuvastatin (CRESTOR) tablet 10 mg (10 mg Oral Given 08/09/19 1205)  apixaban (ELIQUIS) tablet 5 mg (5 mg Oral Given 08/09/19 1334)  sodium chloride flush (NS) 0.9 % injection 3 mL (3 mLs Intravenous Given 08/09/19 1334)  sodium chloride flush (NS) 0.9 % injection 3 mL (has no administration in time range)  0.9 %  sodium chloride infusion (has no administration in time range)  acetaminophen (TYLENOL) tablet 650 mg (has no administration in time range)  oxyCODONE (Oxy IR/ROXICODONE) immediate release tablet 5 mg (has no administration in time range)  polyethylene glycol (MIRALAX / GLYCOLAX) packet 17 g (has no administration in time range)  bisacodyl (DULCOLAX) EC tablet 5 mg (has no administration in time range)  sodium phosphate (FLEET) 7-19 GM/118ML enema 1 enema (has no administration in time range)  ondansetron (ZOFRAN) tablet 4 mg (has no administration in time range)    Or  ondansetron (ZOFRAN) injection 4 mg (has no administration in time range)  albuterol (VENTOLIN HFA) 108 (90 Base) MCG/ACT inhaler 2 puff (has no administration in time range)  guaiFENesin-dextromethorphan (ROBITUSSIN DM) 100-10 MG/5ML syrup 10 mL (10 mLs Oral  Given 08/09/19 1205)  chlorpheniramine-HYDROcodone (TUSSIONEX) 10-8 MG/5ML suspension 5 mL (has no administration in time range)  insulin aspart (novoLOG) injection 0-9 Units (has no administration in time range)  aztreonam (AZACTAM) 2 g in sodium chloride 0.9 % 100 mL IVPB (0 g Intravenous Stopped 08/09/19 1131)  vancomycin (VANCOREADY) IVPB 2000 mg/400 mL (0 mg Intravenous Stopped 08/09/19 1427)    ED Course  I have reviewed the triage vital signs and the nursing notes.  Pertinent labs & imaging results that were available during my care of the patient were reviewed by me and considered in my medical decision making (see chart for details).    MDM Rules/Calculators/A&P                      Patient with recent COVID-19 infection here for evaluation of increased shortness of breath as well as frequent falls. He does have an oxygen requirement of 2 to 3 L in the emergency department without respiratory distress. Chest x-ray with new infiltrates, concern for COVID 19 infection versus developing bacterial pneumonia, will treat with antibiotics in setting of his symptoms. Hospitalist consulted for admission for further treatment. Findings of studies discussed with patient as well as his wife, who are in agreement with treatment plan. Final Clinical Impression(s) / ED Diagnoses Final diagnoses:  Hypoxia  Pneumonia due to COVID-19 virus    Rx / DC Orders ED Discharge Orders    None       Quintella Reichert, MD 08/09/19 [phone]

## 2019-08-09 NOTE — H&P (Signed)
History and Physical    Dylan [name] [id number] DOB: 04/12/1926 DOA: 08/09/2019  PCP: Haywood Pao, MD Consultants:  Caryl Comes - cardiology Patient coming from:  Home - lives with wife; NOK: Wife, Dylan [name], [phone]  Chief Complaint: weakness  HPI: Dylan [name] is a 85 y.o. male with medical history significant of HTN; HLD;  PAF with pacemaker placement, on Eliquis; chronic diastolic CHF; DM; and a lung nodule who was recently admitted to [facility] with COVID-19-associated PNA (12/8-13). He was treated with Decadron, Remdesivir, and Actemra x 1.  He reports that he has had persistent weakness since the time of discharge from [facility]; this is not improving and he has had several falls.  He continues to have intermittent cough that is unchanged.  His wife and son finally insisted that he return to the ER for evaluation.  His wife reports that his O2 level was down to 88%.   ED Course: d/c from [place] 1 week ago - progressive cough, SOB, weakness, falls.  2-3L [place] O2 requirement now.  No fevers, generalized weakness.  CXR with new infiltrates, treating for possible PNA although persistent COVID infection is a consideration.   Review of Systems: As per HPI; otherwise review of systems reviewed and negative.    Past Medical History:  Diagnosis Date  . COVID-19   . Hyperglycemia   . Hyperlipidemia   . Hypertension   . Lung nodule     Past Surgical History:  Procedure Laterality Date  . APPENDECTOMY  1956  . BACK SURGERY  2008  . CARDIAC CATHETERIZATION  1995   negative results  . CARDIOVERSION N/A 08/05/2017   Procedure: CARDIOVERSION;  Surgeon: Larey Dresser, MD;  Location: [facility] ENDOSCOPY;  Service: Cardiovascular;  Laterality: N/A;  . CARDIOVERSION N/A 09/12/2017   Procedure: CARDIOVERSION;  Surgeon: Larey Dresser, MD;  Location: [facility] ENDOSCOPY;  Service: Cardiovascular;  Laterality: N/A;  . COLON SURGERY  07/09/12  . [place]  . HOT HEMOSTASIS  01/08/2012   Procedure: HOT HEMOSTASIS (ARGON PLASMA COAGULATION/BICAP);  Surgeon: Arta Silence, MD;  Location: [facility] ENDOSCOPY;  Service: Endoscopy;  Laterality: N/A;  . LAPAROSCOPIC ILEOCECECTOMY  07/09/2012   Procedure: LAPAROSCOPIC ILEOCECECTOMY;  Surgeon: Edward Jolly, MD;  Location: [facility] ORS;  Service: General;  Laterality: N/A;  Laparoscopic Ileocecectomy  . PACEMAKER IMPLANT N/A 10/05/2017   Procedure: PACEMAKER IMPLANT;  Surgeon: Deboraha Sprang, MD;  Location: [facility] CV LAB;  Service: Cardiovascular;  Laterality: N/A;  . REPLACEMENT TOTAL KNEE  2010  . TEE WITHOUT CARDIOVERSION N/A 08/05/2017   Procedure: TRANSESOPHAGEAL ECHOCARDIOGRAM (TEE);  Surgeon: Larey Dresser, MD;  Location: [facility] ENDOSCOPY;  Service: Cardiovascular;  Laterality: N/A;  . TEE WITHOUT CARDIOVERSION N/A 09/12/2017   Procedure: TRANSESOPHAGEAL ECHOCARDIOGRAM (TEE);  Surgeon: Larey Dresser, MD;  Location: [facility] ENDOSCOPY;  Service: Cardiovascular;  Laterality: N/A;    Social History   Socioeconomic History  . Marital status: Married    Spouse name: Not on file  . Number of children: Not on file  . Years of education: Not on file  . Highest education level: Not on file  Occupational History  . Not on file  Tobacco Use  . Smoking status: Former Smoker    Packs/day: 1.00    Types: Cigarettes    Quit date: 01/07/1992    Years since quitting: 27.6  . Smokeless tobacco: Never Used  Substance and Sexual Activity  . Alcohol use: Yes    Alcohol/week: 2.0 standard  drinks    Types: 2 Glasses of wine per week  . Drug use: No  . Sexual activity: Yes  Other Topics Concern  . Not on file  Social History Narrative  . Not on file   Social Determinants of Health   Financial Resource Strain:   . Difficulty of Paying Living Expenses: Not on file  Food Insecurity:   . Worried About Charity fundraiser in the Last Year: Not on file  . Ran Out of Food in the Last Year: Not on file  Transportation Needs:   . Lack of  Transportation (Medical): Not on file  . Lack of Transportation (Non-Medical): Not on file  Physical Activity:   . Days of Exercise per Week: Not on file  . Minutes of Exercise per Session: Not on file  Stress:   . Feeling of Stress : Not on file  Social Connections:   . Frequency of Communication with Friends and Family: Not on file  . Frequency of Social Gatherings with Friends and Family: Not on file  . Attends Religious Services: Not on file  . Active Member of Clubs or Organizations: Not on file  . Attends Archivist Meetings: Not on file  . Marital Status: Not on file  Intimate Partner Violence:   . Fear of Current or Ex-Partner: Not on file  . Emotionally Abused: Not on file  . Physically Abused: Not on file  . Sexually Abused: Not on file    Allergies  Allergen Reactions  . Keflex [Cephalexin] Hives and Other (See Comments)    Occurred in the 21's    Family History  Problem Relation Age of Onset  . Cancer - Other Mother   . CVA Father   . Heart attack Brother   . Heart attack Maternal Grandmother     Prior to Admission medications   Medication Sig Start Date End Date Taking? Authorizing Provider  acetaminophen (TYLENOL) 325 MG tablet Take 650 mg by mouth every 6 (six) hours as needed for mild pain or headache.     [provider]  amiodarone (PACERONE) 200 MG tablet Take 1 tablet by mouth once daily Patient taking differently: Take 200 mg by mouth daily.  12/09/18   Larey Dresser, MD  apixaban (ELIQUIS) 5 MG TABS tablet Take 1 tablet (5 mg total) by mouth 2 (two) times daily. 05/11/19   Larey Dresser, MD  carvedilol (COREG) 3.125 MG tablet Take 1 tablet (3.125 mg total) by mouth 2 (two) times daily with a meal. 09/17/17   Larey Dresser, MD  Cholecalciferol (VITAMIN D) 2000 units CAPS Take 2,000 Units by mouth daily.     [provider]  furosemide (LASIX) 20 MG tablet Take 1 tablet (20 mg total) by mouth daily. 10/14/17   Larey Dresser, MD  Omega-3 Fatty Acids (FISH OIL) 1200 MG CAPS Take 1,200 mg by mouth 2 (two) times daily.     [provider]  rosuvastatin (CRESTOR) 10 MG tablet Take 1 tablet (10 mg total) by mouth daily. 03/13/19 07/28/19  Larey Dresser, MD  vitamin B-12 (CYANOCOBALAMIN) 1000 MCG tablet Take 1,000 mcg by mouth daily.    [provider]  VITAMIN E PO Take 1 tablet by mouth daily.    [provider]    Physical Exam: Vitals:   08/09/19 1030 08/09/19 1045 08/09/19 1130 08/09/19 1200  BP: 115/60 126/68 110/73 128/80  Pulse: (!) 59 60 62 (!) 58  Resp:  18 15 19    Temp:      TempSrc:      SpO2: 93% 96% 93% 97%     . General:  Appears calm and comfortable and is NAD, complaining of severe hunger . Eyes:  PERRL, EOMI, normal lids, iris . ENT:  grossly normal hearing, lips & tongue, mmm . Neck:  no LAD, masses or thyromegaly . Cardiovascular:  RRR, no m/r/g. No LE edema.  Pacemaker in place in L upper chest. . Respiratory:   Scattered rhonchi.  Normal respiratory effort on 3L [place] O2. . Abdomen:  soft, NT, ND, NABS . Skin:  no rash or induration seen on limited exam . Musculoskeletal:  grossly normal tone BUE/BLE, good ROM, no bony abnormality . Psychiatric:  grossly normal mood and affect, speech fluent and appropriate, AOx3 . Neurologic:  CN 2-12 grossly intact, moves all extremities in coordinated fashion, sensation intact    Radiological Exams on Admission: CT Head Wo Contrast  Result Date: 08/09/2019 CLINICAL DATA:  Frequent falls for the past week. On Eliquis. Right leg weakness. EXAM: CT HEAD WITHOUT CONTRAST TECHNIQUE: Contiguous axial images were obtained from the base of the skull through the vertex without intravenous contrast. COMPARISON:  CT head dated July 09, 2018. FINDINGS: Brain: No evidence of acute infarction, hemorrhage, hydrocephalus, extra-axial collection or mass lesion/mass effect. Stable atrophy and chronic microvascular ischemic  changes. Old small lacunar infarct in the right thalamus again noted. Vascular: Calcified atherosclerosis at the skullbase. No hyperdense vessel. Skull: Normal. Negative for fracture or focal lesion. Sinuses/Orbits: No acute finding.  Prior right mastoidectomy. Other: None. IMPRESSION: 1.  No acute intracranial abnormality. Electronically Signed   By: Titus Dubin M.D.   On: 08/09/2019 09:56   DG Chest Port 1 View  Result Date: 08/09/2019 CLINICAL DATA:  COVID-19 infection progressive cough, weakness and hypoxia. EXAM: PORTABLE CHEST 1 VIEW COMPARISON:  07/28/2019 FINDINGS: Cardiomediastinal contours are stable, dual lead left-sided pacer device in place power pack projecting over left chest. Lung volumes are diminished compared to the previous exam with patchy bilateral basilar opacities. No signs of dense consolidation or evidence of pleural effusion. No acute bone process. IMPRESSION: Low lung volumes with patchy bilateral basilar opacities, new since the previous exam. Sequela of viral/atypical infection is considered. Electronically Signed   By: Zetta Bills M.D.   On: 08/09/2019 09:28    EKG: Independently reviewed.  NSR with rate 76; borderline prolonged QTc 497; nonspecific ST changes    Labs on Admission: I have personally reviewed the available labs and imaging studies at the time of the admission.  Pertinent labs:   Na++ 133 BUN 27/Creatinine 1.10/GFR 58 Albumin 3.1 LDH 261 Ferritin 484 CRP 0.7 Lactate 2.0 WBC 18.3 D-dimer 0.95 Fibrinogen 330 Procalcitonin <0.10 COVID POSITIVE on 12/8   Assessment/Plan Principal Problem:   Pneumonia due to COVID-19 virus Active Problems:   Benign essential HTN   Type 2 diabetes mellitus without complication (HCC)   Dyslipidemia   Atrial fibrillation (HCC)   Chronic diastolic CHF (congestive heart failure) (HCC)   Acute respiratory failure with hypoxia and weakness associated with persistent COVID-19 infection -Patient with  presenting with SOB, cough, and progressive weakness -He was recently hospitalized for COVID-19 infection at [facility] -He received steroids, remdesivir, and Actemra x 1 while hospitalized but is now returning with weakness, cough, and hypoxia -He does not have a usual home O2 requirement and is currently requiring 3L [place] O2 -COVID POSITIVE 12/8 - not out of 21 day  quarantine/infectious window -The patient has comorbidities which may increase the risk for ARDS/MODS including: age, HTN, DM -Pertinent labs concerning for COVID include low procalcitonin; elevated D-dimer (but not >1);increased LDH; increased ferritin -CXR with multifocal opacities which may be c/w COVID vs. Multifocal PNA, new since prior exam  -Will not treat with broad-spectrum antibiotics given procalcitonin <0.1 -Will admit to [facility] for further evaluation, close monitoring, and treatment -Monitor on telemetry x at least 24 hours -At this time, will attempt to avoid use of aerosolized medications and use HFAs instead -Will check daily labs including BMP with Mag, Phos; LFTs; CBC with differential; CRP; ferritin; fibrinogen; D-dimer -Will attempt to maintain euvolemia to a net negative fluid status -PT/OT consults ordered -Will defer further management to [facility] physicians -Patient was seen wearing full PPE including: gown, gloves, head cover, N95, and face shield; donning and doffing was in compliance with current standards.  Afib on Eliquis -Rate controlled with pacemaker, Coreg, and Amiodarone -Continue Eliquis  HTN -Continue Coreg  Chronic diastolic CHF -Appears to be compensated at this time  HLD -Continue Crestor  DM -Recent A1c was 5.9, indicating good control -He is diet controlled -Will monitor with fasting labs for now   DVT prophylaxis:  Eliquis  Code Status:  DNR - advanced directives present on chart Family Communication: None present; I spoke with the patient's wife by telephone. Disposition Plan:  Home once  clinically improved Consults called: None  Admission status: Admit - It is my clinical opinion that admission to INPATIENT is reasonable and necessary because of the expectation that this patient will require hospital care that crosses at least 2 midnights to treat this condition based on the medical complexity of the problems presented.  Given the aforementioned information, the predictability of an adverse outcome is felt to be significant.     Karmen Bongo MD [facility]   How to contact the [facility] Attending or Consulting provider [place] or covering provider during after hours [place], for this patient?  1. Check the care team in [facility] and look for a) attending/consulting TR[facility] provider listed and b) the [facility] team listed 2. Log into [email or web address] and use [place]'s universal password to access. If you do not have the password, please contact the hospital operator. 3. Locate the [facility] provider you are looking for under [facility] and page to a number that you can be directly reached. 4. If you still have difficulty reaching the provider, please page the [facility] (Director on Call) for the Hospitalists listed on amion for assistance.   08/09/2019, 1:20 PM

## 2019-08-09 NOTE — Progress Notes (Signed)
Pharmacy Antibiotic Note  Dylan [name] is a 85 y.o. male admitted on 08/09/2019 with pneumonia.  Pharmacy has been consulted for vancomycin dosing. Also has aztreonam ordered x 1 dose per EDP. History of "hives" reaction to Keflex reported with no history of beta-lactam use documented in the chart. SCr 1.1 on admit.  Plan: Aztreonam 2g IV x 1 per EDP - f/u if to continue Vancomycin 2g IV x 1; then Vancomycin 1250 mg IV Q 24 hrs. Goal AUC 400-550. Expected AUC: 479 SCr used: 1.1 Monitor clinical progress, c/s, renal function F/u de-escalation plan/LOT, vancomycin levels as indicated      Temp (24hrs), Avg:97.8 F (36.6 C), Min:97.8 F (36.6 C), Max:97.8 F (36.6 C)  Recent Labs  Lab 08/09/19 0912  WBC 18.3*  CREATININE 1.10  LATICACIDVEN 2.0*    Estimated Creatinine Clearance: 58.2 mL/min (by C-G formula based on SCr of 1.1 mg/dL).    Allergies  Allergen Reactions  . Keflex [Cephalexin] Hives and Other (See Comments)    Occurred in the 1980's    Elicia Lamp, PharmD, BCPS Please check AMION for all [place] contact numbers Clinical Pharmacist 08/09/2019 10:16 AM

## 2019-08-09 NOTE — ED Notes (Signed)
[facility]/Transfer to [facility]) called @ 1300.

## 2019-08-09 NOTE — ED Notes (Signed)
Lunch Tray Ordered@ 1148. 

## 2019-08-09 NOTE — ED Triage Notes (Signed)
Pt arrives to ED from home with complaints of weakness and cough for the last 2 to 3 days. Patient was dx with COVID on 12/8 and discharged from [facility] on 12/13. Per family patient has been falling more often since hes been home from the hospital. No complaints of SOB or fever.

## 2019-08-09 NOTE — ED Notes (Signed)
[facility] at bedside to transfer patient to [facility]. Patient verbalized consent for transfer. Patient in no pain at this moment. Patient O2 at 95% on 2L [place] at this time. Report given to [facility] RN.

## 2019-08-10 DIAGNOSIS — J1289 Other viral pneumonia: Secondary | ICD-10-CM

## 2019-08-10 DIAGNOSIS — U071 COVID-19: Principal | ICD-10-CM

## 2019-08-10 LAB — CBC WITH DIFFERENTIAL/PLATELET
Abs Immature Granulocytes: 0.07 10*3/uL (ref 0.00–0.07)
Basophils Absolute: 0 10*3/uL (ref 0.0–0.1)
Basophils Relative: 0 %
Eosinophils Absolute: 0.1 10*3/uL (ref 0.0–0.5)
Eosinophils Relative: 1 %
HCT: 41.4 % (ref 39.0–52.0)
Hemoglobin: 13.6 g/dL (ref 13.0–17.0)
Immature Granulocytes: 1 %
Lymphocytes Relative: 11 %
Lymphs Abs: 1.5 10*3/uL (ref 0.7–4.0)
MCH: 31.3 pg (ref 26.0–34.0)
MCHC: 32.9 g/dL (ref 30.0–36.0)
MCV: 95.2 fL (ref 80.0–100.0)
Monocytes Absolute: 0.5 10*3/uL (ref 0.1–1.0)
Monocytes Relative: 4 %
Neutro Abs: 11.1 10*3/uL — ABNORMAL HIGH (ref 1.7–7.7)
Neutrophils Relative %: 83 %
Platelets: 94 10*3/uL — ABNORMAL LOW (ref 150–400)
RBC: 4.35 MIL/uL (ref 4.22–5.81)
RDW: 13.2 % (ref 11.5–15.5)
WBC: 13.2 10*3/uL — ABNORMAL HIGH (ref 4.0–10.5)
nRBC: 0 % (ref 0.0–0.2)

## 2019-08-10 LAB — GLUCOSE, CAPILLARY
Glucose-Capillary: 111 mg/dL — ABNORMAL HIGH (ref 70–99)
Glucose-Capillary: 97 mg/dL (ref 70–99)
Glucose-Capillary: 109 mg/dL — ABNORMAL HIGH (ref 70–99)
Glucose-Capillary: 92 mg/dL (ref 70–99)
Glucose-Capillary: 102 mg/dL — ABNORMAL HIGH (ref 70–99)
Glucose-Capillary: 157 mg/dL — ABNORMAL HIGH (ref 70–99)

## 2019-08-10 LAB — BRAIN NATRIURETIC PEPTIDE: B Natriuretic Peptide: 45.2 pg/mL (ref 0.0–100.0)

## 2019-08-10 LAB — COMPREHENSIVE METABOLIC PANEL
ALT: 21 U/L (ref 0–44)
AST: 22 U/L (ref 15–41)
Albumin: 2.8 g/dL — ABNORMAL LOW (ref 3.5–5.0)
Alkaline Phosphatase: 62 U/L (ref 38–126)
Anion gap: 12 (ref 5–15)
BUN: 30 mg/dL — ABNORMAL HIGH (ref 8–23)
CO2: 24 mmol/L (ref 22–32)
Calcium: 8.5 mg/dL — ABNORMAL LOW (ref 8.9–10.3)
Chloride: 98 mmol/L (ref 98–111)
Creatinine, Ser: 1.05 mg/dL (ref 0.61–1.24)
GFR calc Af Amer: >60 mL/min (ref >60)
GFR calc non Af Amer: >60 mL/min (ref >60)
Glucose, Bld: 73 mg/dL (ref 70–99)
Potassium: 4.5 mmol/L (ref 3.5–5.1)
Sodium: 134 mmol/L — ABNORMAL LOW (ref 135–145)
Total Bilirubin: 1.7 mg/dL — ABNORMAL HIGH (ref 0.3–1.2)
Total Protein: 5.3 g/dL — ABNORMAL LOW (ref 6.5–8.1)

## 2019-08-10 LAB — C-REACTIVE PROTEIN: CRP: 0.5 mg/dL (ref <1.0)

## 2019-08-10 LAB — D-DIMER, QUANTITATIVE: D-Dimer, Quant: 0.66 ug/mL-FEU — ABNORMAL HIGH (ref 0.00–0.50)

## 2019-08-10 LAB — PROCALCITONIN: Procalcitonin: <0.1 ng/mL

## 2019-08-10 LAB — MAGNESIUM: Magnesium: 2 mg/dL (ref 1.7–2.4)

## 2019-08-10 NOTE — Discharge Instructions (Addendum)
Follow with Primary MD Tisovec, Fransico Him, MD in 7 days   Get CBC, CMP, 2 view Chest X ray -  checked next visit within 1 week by Primary MD or SNF MD   Activity: Use walker at all times, has severe orthostatic hypotension, wear TED stockings when out of bed, when coming from laying to standing position first sit down with knees down for 2 to 3 minutes, then gradually stand up with a walker available, if you get dizzy sit down immediately.  Full fall precautions use walker/cane & assistance as needed  Disposition SNF  Diet: Heart Healthy Low Carb  Special Instructions: If you have smoked or chewed Tobacco  in the last 2 yrs please stop smoking, stop any regular Alcohol  and or any Recreational drug use.  On your next visit with your primary care physician please Get Medicines reviewed and adjusted.  Please request your Prim.MD to go over all Hospital Tests and Procedure/Radiological results at the follow up, please get all Hospital records sent to your Prim MD by signing hospital release before you go home.  If you experience worsening of your admission symptoms, develop shortness of breath, life threatening emergency, suicidal or homicidal thoughts you must seek medical attention immediately by calling 911 or calling your MD immediately  if symptoms less severe.  You Must read complete instructions/literature along with all the possible adverse reactions/side effects for all the Medicines you take and that have been prescribed to you. Take any new Medicines after you have completely understood and accpet all the possible adverse reactions/side effects.     Information on my medicine - ELIQUIS (apixaban)  This medication education was reviewed with me or my healthcare representative as part of my discharge preparation.    Why was Eliquis prescribed for you? Eliquis was prescribed for you to reduce the risk of a blood clot forming that can cause a stroke if you have a medical condition  called atrial fibrillation (a type of irregular heartbeat).  What do You need to know about Eliquis ? Take your Eliquis TWICE DAILY - one tablet in the morning and one tablet in the evening with or without food. If you have difficulty swallowing the tablet whole please discuss with your pharmacist how to take the medication safely.  Take Eliquis exactly as prescribed by your doctor and DO NOT stop taking Eliquis without talking to the doctor who prescribed the medication.  Stopping may increase your risk of developing a stroke.  Refill your prescription before you run out.  After discharge, you should have regular check-up appointments with your healthcare provider that is prescribing your Eliquis.  In the future your dose may need to be changed if your kidney function or weight changes by a significant amount or as you get older.  What do you do if you miss a dose? If you miss a dose, take it as soon as you remember on the same day and resume taking twice daily.  Do not take more than one dose of ELIQUIS at the same time to make up a missed dose.  Important Safety Information A possible side effect of Eliquis is bleeding. You should call your healthcare provider right away if you experience any of the following: ? Bleeding from an injury or your nose that does not stop. ? Unusual colored urine (red or dark brown) or unusual colored stools (red or black). ? Unusual bruising for unknown reasons. ? A serious fall or if you hit your  head (even if there is no bleeding).  Some medicines may interact with Eliquis and might increase your risk of bleeding or clotting while on Eliquis. To help avoid this, consult your healthcare provider or pharmacist prior to using any new prescription or non-prescription medications, including herbals, vitamins, non-steroidal anti-inflammatory drugs (NSAIDs) and supplements.  This website has more information on Eliquis (apixaban):  http://www.eliquis.com/eliquis/home

## 2019-08-10 NOTE — Plan of Care (Signed)
  Problem: Education: Goal: Knowledge of risk factors and measures for prevention of condition will improve Outcome: Progressing   

## 2019-08-10 NOTE — Evaluation (Signed)
Physical Therapy Evaluation Patient Details Name: JIHAD DUFFIE MRN: [id number] DOB: 1925/09/19 Today's Date: 08/10/2019   History of Present Illness  85 y.o. male with medical history significant of HTN; HLD;  PAF with pacemaker placement, on Eliquis; chronic diastolic CHF; DM; and a lung nodule who was recently admitted with COVID-19-associated PNA (12/8-13). He was treated with Decadron, Remdesivir, and Actemra x 1.  He reports that he has had persistent weakness since the time of discharge from [facility]; this is not improving and he has had several falls.  CXR with new infiltrates, admitted for treatment of possible PNA although suspected persistent COVID infection.   Clinical Impression  The patient has returned to [place] due to multiple falls, requiring  Rescue squad to come to house to assist  Up.  Patient may benefit from short term rehab to ensure return to safe ambulation. Pt admitted with above diagnosis.  Pt currently with functional limitations due to the deficits listed below (see PT Problem List). Pt will benefit from skilled PT to increase their independence and safety with mobility to allow discharge to the venue listed below.  SPO2 on RA > 90%.     Follow Up Recommendations SNF;Home health PT(depends on progress and fall risk)    Equipment Recommendations  None recommended by PT    Recommendations for Other Services       Precautions / Restrictions Precautions Precautions: Fall Precaution Comments: hx of falls PTA, since DC'd from [facility] Restrictions Weight Bearing Restrictions: No      Mobility  Bed Mobility Overal bed mobility: Needs Assistance Bed Mobility: Supine to Sit     Supine to sit: Min guard;HOB elevated     General bed mobility comments: increased time/effort, minguard for safety  Transfers Overall transfer level: Needs assistance Equipment used: Rolling walker (2 wheeled) Transfers: Sit to/from Stand Sit to Stand: Min assist         General transfer  comment: assist to rise and steady at RW, VCs for safety  Ambulation/Gait Ambulation/Gait assistance: Min guard Gait Distance (Feet): 55 Feet Assistive device: Rolling walker (2 wheeled) Gait Pattern/deviations: Step-through pattern Gait velocity: decreased   General Gait Details: Pt manuevered with RW in room with tight spaces and turns with min/guard. o2 >92% on room air.  Stairs            Wheelchair Mobility    Modified Rankin (Stroke Patients Only)       Balance Overall balance assessment: History of Falls;Needs assistance Sitting-balance support: Feet supported Sitting balance-[name]: Fair     Standing balance support: Bilateral upper extremity supported Standing balance-[name]: Poor Standing balance comment: reliant on UE support                             Pertinent Vitals/Pain Pain Assessment: No/denies pain    Home Living Family/patient expects to be discharged to:: Private residence Living Arrangements: Spouse/significant other Available Help at Discharge: Family;Available 24 hours/day Type of Home: House Home Access: Stairs to enter;Ramped entrance   Entrance Stairs-Number of Steps: 1 Home Layout: One level Home Equipment: Walker - 2 wheels;Walker - 4 wheels;Grab bars - tub/shower;Shower seat Additional Comments: home setup obtained from most recent admit    Prior Function Level of Independence: Independent with assistive device(s)         Comments: Amb with RW at home and rollator for community ambulation, reports x3 falls at home since last admit/discharge home  Hand Dominance        Extremity/Trunk Assessment   Upper Extremity Assessment Upper Extremity Assessment: Generalized weakness    Lower Extremity Assessment Lower Extremity Assessment: Generalized weakness    Cervical / Trunk Assessment Cervical / Trunk Assessment: Normal  Communication   Communication: HOH  Cognition Arousal/Alertness:  Awake/alert Behavior During Therapy: WFL for tasks assessed/performed Overall Cognitive Status: No family/caregiver present to determine baseline cognitive functioning                                 General Comments: pt with delayed processing and endorses intermittent confusion, difficult to fully assess as pt very HOH requiring repetition, cues, will continue to assess       General Comments General comments (skin integrity, edema, etc.): pt on RA with SpO2 >90% throughout activity, max HR noted 95 with mobility     Exercises Other Exercises Other Exercises: use of flutter valve and IS   Assessment/Plan    PT Assessment Patient needs continued PT services  PT Problem List Decreased strength;Decreased activity tolerance;Cardiopulmonary status limiting activity;Decreased mobility;Decreased safety awareness       PT Treatment Interventions DME instruction;Gait training;Functional mobility training;Therapeutic activities;Therapeutic exercise;Balance training;Patient/family education    PT Goals (Current goals can be found in the Care Plan section)  Acute Rehab PT Goals Patient Stated Goal: regain strength, independence PT Goal Formulation: With patient Time For Goal Achievement: 08/24/19 Potential to Achieve Goals: Good    Frequency (chg if goes to SNF)   Barriers to discharge        Co-evaluation PT/OT/SLP Co-Evaluation/Treatment: Yes Reason for Co-Treatment: For patient/therapist safety PT goals addressed during session: Mobility/safety with mobility OT goals addressed during session: ADL's and self-care       AM-PAC PT "6 Clicks" Mobility  Outcome Measure Help needed turning from your back to your side while in a flat bed without using bedrails?: A Little Help needed moving from lying on your back to sitting on the side of a flat bed without using bedrails?: A Little Help needed moving to and from a bed to a chair (including a wheelchair)?: A  Little Help needed standing up from a chair using your arms (e.g., wheelchair or bedside chair)?: A Little Help needed to walk in hospital room?: A Lot Help needed climbing 3-5 steps with a railing? : A Lot 6 Click Score: 16    End of Session Equipment Utilized During Treatment: Gait belt Activity Tolerance: Patient tolerated treatment well;Patient limited by fatigue Patient left: in chair;with call bell/phone within reach;with chair alarm set Nurse Communication: Mobility status PT Visit Diagnosis: Unsteadiness on feet (R26.81);Difficulty in walking, not elsewhere classified (R26.2)    Time: [id number] PT Time Calculation (min) (ACUTE ONLY): 24 min   Charges:   PT Evaluation $PT Eval Low Complexity: [place] [facility] Acute Rehabilitation Services Pager [phone] Office [phone]   Claretha Cooper 08/10/2019, 12:50 PM

## 2019-08-10 NOTE — Evaluation (Signed)
Occupational Therapy Evaluation Patient Details Name: Dylan [name] MRN: [id number] DOB: 12/06/1925 Today's Date: 08/10/2019    History of Present Illness 85 y.o. male with medical history significant of HTN; HLD;  PAF with pacemaker placement, on Eliquis; chronic diastolic CHF; DM; and a lung nodule who was recently admitted with COVID-19-associated PNA (12/8-13). He was treated with Decadron, Remdesivir, and Actemra x 1.  He reports that he has had persistent weakness since the time of discharge from [facility]; this is not improving and he has had several falls.  CXR with new infiltrates, admitted for treatment of possible PNA although suspected persistent COVID infection.    Clinical Impression   This 85 y/o male presents with the above. Pt with recent admit and discharge home from hospital, reports use of RW/rollator with mobility prior to this admission but reports increased number of falls since most recent discharge home (reports x3). Pt presents with continued weakness, decreased standing balance and activity tolerance. Pt tolerating room level mobility using RW with overall minA. He currently requires setup/minguard assist for seated UB ADL, at least modA for LB ADL; requiring intermittent cues for safety throughout. Pt on RA with SpO2 >90% with room level activity. Pt will benefit from continued acute OT services; given unsteadiness/weakness and hx of recent falls at home currently recommend follow up therapy services in SNF setting prior to return home. Will follow.     Follow Up Recommendations  SNF;Supervision/Assistance - 24 hour    Equipment Recommendations  Other (comment);None recommended by OT(defer to next venue)           Precautions / Restrictions Precautions Precautions: Fall Precaution Comments: hx of falls PTA Restrictions Weight Bearing Restrictions: No      Mobility Bed Mobility Overal bed mobility: Needs Assistance Bed Mobility: Supine to Sit     Supine to  sit: Min guard;HOB elevated     General bed mobility comments: increased time/effort, minguard for safety  Transfers Overall transfer level: Needs assistance Equipment used: Rolling walker (2 wheeled) Transfers: Sit to/from Stand Sit to Stand: Min assist         General transfer comment: assist to rise and steady at RW, VCs for safety    Balance Overall balance assessment: History of Falls;Needs assistance Sitting-balance support: Feet supported Sitting balance-[name]: Fair     Standing balance support: Bilateral upper extremity supported Standing balance-[name]: Poor Standing balance comment: reliant on UE support                           ADL either performed or assessed with clinical judgement   ADL Overall ADL's : Needs assistance/impaired Eating/Feeding: Set up;Sitting Eating/Feeding Details (indicate cue type and reason): minimal setup to open containers as pt reports neuropathy in fingers, able to open majority of containers, required assist to locate specific items prior to use (sugar, cream, etc) Grooming: Wash/dry hands;Set up;Sitting   Upper Body Bathing: Min guard;Sitting   Lower Body Bathing: Moderate assistance;Sit to/from stand   Upper Body Dressing : Set up;Sitting   Lower Body Dressing: Moderate assistance;Sit to/from stand   Toilet Transfer: Minimal assistance;+2 for safety/equipment;Ambulation;RW Toilet Transfer Details (indicate cue type and reason): simulated via transfer to recliner, amb to door and back from [place] and Hygiene: Minimal assistance;Moderate assistance;Sit to/from stand Toileting - Clothing Manipulation Details (indicate cue type and reason): pt using urinal at bed level upon start of session     Functional mobility  during ADLs: Minimal assistance;+2 for safety/equipment;Rolling walker General ADL Comments: pt with weakness, decreased endurance, ?cognitive impairments      Pertinent Vitals/Pain Pain Assessment: No/denies pain     Hand Dominance     Extremity/Trunk Assessment Upper Extremity Assessment Upper Extremity Assessment: Generalized weakness(neuropathy in digits bil UE)   Lower Extremity Assessment Lower Extremity Assessment: Defer to PT evaluation       Communication Communication Communication: HOH   Cognition Arousal/Alertness: Awake/alert Behavior During Therapy: WFL for tasks assessed/performed Overall Cognitive Status: No family/caregiver present to determine baseline cognitive functioning                                 General Comments: pt with delayed processing and endorses intermittent confusion, difficult to fully assess as pt very HOH requiring repetition, cues, will continue to assess    General Comments  pt on RA with SpO2 >90% throughout activity, max HR noted 95 with mobility     Exercises Exercises: Other exercises Other Exercises Other Exercises: use of flutter valve and IS   Shoulder Instructions      Home Living Family/patient expects to be discharged to:: Private residence Living Arrangements: Spouse/significant other Available Help at Discharge: Family;Available 24 hours/day Type of Home: House Home Access: Stairs to enter;Ramped entrance Entrance Stairs-Number of Steps: 1   Home Layout: One level         Bathroom Toilet: Standard     Home Equipment: Environmental consultant - 2 wheels;Walker - 4 wheels;Grab bars - tub/shower;Shower seat   Additional Comments: home setup obtained from most recent admit      Prior Functioning/Environment Level of Independence: Independent with assistive device(s)        Comments: Amb with RW at home and rollator for community ambulation, reports x3 falls at home since last admit/discharge home        OT Problem List: Decreased strength;Decreased range of motion;Decreased activity tolerance;Impaired balance (sitting and/or standing);Decreased  cognition;Decreased safety awareness;Decreased knowledge of use of DME or AE;Obesity;Cardiopulmonary status limiting activity      OT Treatment/Interventions: Self-care/ADL training;Therapeutic exercise;Energy conservation;DME and/or AE instruction;Therapeutic activities;Patient/family education;Balance training    OT Goals(Current goals can be found in the care plan section) Acute Rehab OT Goals Patient Stated Goal: regain strength, independence OT Goal Formulation: With patient Time For Goal Achievement: 08/24/19 Potential to Achieve Goals: Good  OT Frequency: Min 2X/week   Barriers to D/C:            Co-evaluation PT/OT/SLP Co-Evaluation/Treatment: Yes Reason for Co-Treatment: For patient/therapist safety;To address functional/ADL transfers   OT goals addressed during session: ADL's and self-care      AM-PAC OT "6 Clicks" Daily Activity     Outcome Measure Help from another person eating meals?: None Help from another person taking care of personal grooming?: A Little Help from another person toileting, which includes using toliet, bedpan, or urinal?: A Lot Help from another person bathing (including washing, rinsing, drying)?: A Lot Help from another person to put on and taking off regular upper body clothing?: A Little Help from another person to put on and taking off regular lower body clothing?: A Lot 6 Click Score: 16   End of Session Equipment Utilized During Treatment: Rolling walker;Gait belt Nurse Communication: Mobility status  Activity Tolerance: Patient tolerated treatment well Patient left: in chair;with call bell/phone within reach;with chair alarm set  OT Visit Diagnosis: Muscle weakness (generalized) (M62.81);History of falling (Z91.81);Unsteadiness on  feet (R26.81)                Time: [id number] OT Time Calculation (min): 24 min Charges:  OT General Charges $OT Visit: 1 Visit OT Evaluation $OT Eval Moderate Complexity: [place],  OT [employer] Pager [phone] Office [phone]   Raymondo Band 08/10/2019, 11:01 AM

## 2019-08-10 NOTE — Progress Notes (Signed)
PROGRESS NOTE                                                                                                                                                                                                             Patient Demographics:    Dylan [name], is a 85 y.o. male, DOB - July 13, 1926, [id number]  Outpatient Primary MD for the patient is Tisovec, Fransico Him, MD    LOS - 1  Admit date - 08/09/2019    Chief Complaint  Patient presents with  . Covid/weakness  . Cough       Brief Narrative - Dylan [name] is a 85 y.o. male with medical history significant of HTN; HLD;  PAF with pacemaker placement, on Eliquis; chronic diastolic CHF; DM; and a lung nodule who was recently admitted to [facility] with COVID-19-associated PNA, he was treated appropriately and sent home on room air.  At home he fine for the first few days and then he started falling due to weakness.   Subjective:    Dylan [name] today has, No headache, No chest pain, No abdominal pain - No Nausea, No new weakness tingling or numbness,  Mild Cough - SOB.     Assessment  & Plan :     1. Resolved and fully treated Covid 19 Viral Pneumonitis during the ongoing 2020 Covid 19 Pandemic - this [place] was appropriately treated last admission, no hypoxia at this time he is stable on room air and in no respiratory distress.  His only pulmonary problem is cough.  He was readmitted because of multiple falls at home, I had detailed discussions with patient's son who states that patient's PCP sent him to the ER and that ER then admitted him to the hospital for reasons unknown to him.  No further treatment for COVID-19 will be done this admission.  Encouraged the patient to sit up in chair in the daytime use I-S and flutter valve for pulmonary toiletry and then prone in bed when at night.   SpO2: 92 % O2 Flow Rate (L/min): 1 L/min  Recent Labs  Lab 08/09/19 0912  08/10/19 0240  CRP 0.7 0.5  DDIMER 0.95* 0.66*  FERRITIN 484*  --   BNP  --  45.2  PROCALCITON <0.10 <0.10    2.  Generalized weakness and multiple falls.  PT OT  and placement if needed.  3.  Chronic A. fib and rate control.  Mali vas 2 score of greater than 3.  Continue combination of Coreg, amiodarone and Eliquis.  Patient has a indwelling pacemaker.  4.  Essential hypertension.  On Coreg continue.  5.  Dyslipidemia.  On Crestor.  6.  Chronic diastolic CHF.  EF 60% on recent echocardiogram done in 2019.  Compensated.     Condition - Fair  Family Communication  :  Son 12/21  Code Status :  DNR  Diet :   Diet Order            Diet heart healthy/carb modified Room service appropriate? Yes; Fluid consistency: Thin  Diet effective now               Disposition Plan  :  SNF  Consults  :  None  Procedures  :     PUD Prophylaxis :    DVT Prophylaxis  : Eliquis  Lab Results  Component Value Date   PLT 94 (L) 08/10/2019    Inpatient Medications  Scheduled Meds: . albuterol  2 puff Inhalation Q6H  . amiodarone  200 mg Oral Daily  . apixaban  5 mg Oral BID  . carvedilol  3.125 mg Oral BID WC  . insulin aspart  0-9 Units Subcutaneous TID WC  . rosuvastatin  10 mg Oral Daily  . sodium chloride flush  3 mL Intravenous Q12H   Continuous Infusions: . sodium chloride     PRN Meds:.sodium chloride, acetaminophen, bisacodyl, chlorpheniramine-HYDROcodone, guaiFENesin-dextromethorphan, [DISCONTINUED] ondansetron **OR** ondansetron (ZOFRAN) IV, polyethylene glycol, sodium chloride flush, sodium phosphate  Antibiotics  :    Anti-infectives (From admission, onward)   Start     Dose/Rate Route Frequency Ordered Stop   08/10/19 1130  vancomycin (VANCOREADY) IVPB 1250 mg/250 mL  Status:  Discontinued     1,250 mg 166.7 mL/hr over 90 Minutes Intravenous Every 24 hours 08/09/19 1019 08/09/19 1648   08/09/19 1900  aztreonam (AZACTAM) 2 g in sodium chloride 0.9 % 100 mL  IVPB  Status:  Discontinued     2 g 200 mL/hr over 30 Minutes Intravenous Every 8 hours 08/09/19 1127 08/09/19 1648   08/09/19 1030  vancomycin (VANCOREADY) IVPB 2000 mg/400 mL     2,000 mg 200 mL/hr over 120 Minutes Intravenous  Once 08/09/19 1019 08/09/19 1427   08/09/19 1015  aztreonam (AZACTAM) 2 g in sodium chloride 0.9 % 100 mL IVPB     2 g 200 mL/hr over 30 Minutes Intravenous  Once 08/09/19 1007 08/09/19 1131       Time Spent in minutes  30   Lala Lund M.D on 08/10/2019 at 1:30 PM  To page go to [email or web address] - password [facility]  [facility] -  Office  [phone]   See all Orders from today for further details    Objective:   Vitals:   08/10/19 0538 08/10/19 0740 08/10/19 1147 08/10/19 1250  BP: 122/68 123/63 (!) 93/56 127/71  Pulse: 60 60 62   Resp: 14 16 12    Temp: 98.7 F (37.1 C) 98.3 F (36.8 C) 98.1 F (36.7 C)   TempSrc: Oral Oral Oral   SpO2: 92% 94% 92%   Weight:      Height:        Wt Readings from Last 3 Encounters:  08/09/19 112 kg  07/28/19 113.4 kg  01/02/19 109.8 kg     Intake/Output Summary (Last 24 hours) at 08/10/2019 1330 Last  data filed at 08/10/2019 0848 Gross per 24 hour  Intake 1196.69 ml  Output 950 ml  Net 246.69 ml     Physical Exam  Awake Alert,  No new F.N deficits, Normal affect [place].AT,PERRAL Supple Neck,No JVD, No cervical lymphadenopathy appriciated.  Symmetrical Chest wall movement, Good air movement bilaterally, CTAB RRR,No Gallops,Rubs or new Murmurs, No Parasternal Heave +ve B.Sounds, Abd Soft, No tenderness, No organomegaly appriciated, No rebound - guarding or rigidity. No Cyanosis, Clubbing or edema, No new Rash or bruise      Data Review:    CBC Recent Labs  Lab 08/09/19 0912 08/10/19 0240  WBC 18.3* 13.2*  HGB 15.6 13.6  HCT 45.6 41.4  PLT PLATELET CLUMPS NOTED ON SMEAR, COUNT APPEARS DECREASED 94*  MCV 93.6 95.2  MCH 32.0 31.3  MCHC 34.2 32.9  RDW 12.9 13.2  LYMPHSABS 0.8 1.5   MONOABS 0.4 0.5  EOSABS 0.1 0.1  BASOSABS 0.0 0.0    Chemistries  Recent Labs  Lab 08/09/19 0912 08/10/19 0240  NA 133* 134*  K 4.1 4.5  CL 100 98  CO2 23 24  GLUCOSE 95 73  BUN 27* 30*  CREATININE 1.10 1.05  CALCIUM 8.4* 8.5*  MG  --  2.0  AST 23 22  ALT 24 21  ALKPHOS 63 62  BILITOT 1.8* 1.7*   ------------------------------------------------------------------------------------------------------------------ Recent Labs    08/09/19 0912  TRIG 116    Lab Results  Component Value Date   HGBA1C 5.9 (H) 07/29/2019   ------------------------------------------------------------------------------------------------------------------ No results for input(s): TSH, T4TOTAL, T3FREE, THYROIDAB in the last 72 hours.  Invalid input(s): FREET3  Cardiac Enzymes No results for input(s): CKMB, TROPONINI, MYOGLOBIN in the last 168 hours.  Invalid input(s): CK ------------------------------------------------------------------------------------------------------------------    Component Value Date/Time   BNP 45.2 08/10/2019 0240    Micro Results Recent Results (from the past 240 hour(s))  Blood Culture (routine x 2)     Status: None (Preliminary result)   Collection Time: 08/09/19  9:14 AM   Specimen: BLOOD RIGHT ARM  Result Value Ref Range Status   Specimen Description BLOOD RIGHT ARM  Final   Special Requests   Final    BOTTLES DRAWN AEROBIC AND ANAEROBIC Blood Culture results may not be optimal due to an excessive volume of blood received in culture bottles   Culture   Final    NO GROWTH < 24 HOURS Performed at [facility], [place]. [address] [place], [place] [address]    Report Status PENDING  Incomplete  Blood Culture (routine x 2)     Status: None (Preliminary result)   Collection Time: 08/09/19  9:30 AM   Specimen: BLOOD RIGHT ARM  Result Value Ref Range Status   Specimen Description BLOOD RIGHT ARM  Final   Special Requests   Final    BOTTLES DRAWN  AEROBIC AND ANAEROBIC Blood Culture results may not be optimal due to an inadequate volume of blood received in culture bottles   Culture   Final    NO GROWTH < 24 HOURS Performed at [facility], [place] [address], [place], [place] [address]    Report Status PENDING  Incomplete    Radiology Reports CT Head Wo Contrast  Result Date: 08/09/2019 CLINICAL DATA:  Frequent falls for the past week. On Eliquis. Right leg weakness. EXAM: CT HEAD WITHOUT CONTRAST TECHNIQUE: Contiguous axial images were obtained from the base of the skull through the vertex without intravenous contrast. COMPARISON:  CT head dated  July 09, 2018. FINDINGS: Brain: No evidence of acute infarction, hemorrhage, hydrocephalus, extra-axial collection or mass lesion/mass effect. Stable atrophy and chronic microvascular ischemic changes. Old small lacunar infarct in the right thalamus again noted. Vascular: Calcified atherosclerosis at the skullbase. No hyperdense vessel. Skull: Normal. Negative for fracture or focal lesion. Sinuses/Orbits: No acute finding.  Prior right mastoidectomy. Other: None. IMPRESSION: 1.  No acute intracranial abnormality. Electronically Signed   By: Titus Dubin M.D.   On: 08/09/2019 09:56   DG Chest Port 1 View  Result Date: 08/09/2019 CLINICAL DATA:  COVID-19 infection progressive cough, weakness and hypoxia. EXAM: PORTABLE CHEST 1 VIEW COMPARISON:  07/28/2019 FINDINGS: Cardiomediastinal contours are stable, dual lead left-sided pacer device in place power pack projecting over left chest. Lung volumes are diminished compared to the previous exam with patchy bilateral basilar opacities. No signs of dense consolidation or evidence of pleural effusion. No acute bone process. IMPRESSION: Low lung volumes with patchy bilateral basilar opacities, new since the previous exam. Sequela of viral/atypical infection is considered. Electronically Signed   By: Zetta Bills M.D.   On: 08/09/2019 09:28   DG  Chest Portable 1 View  Result Date: 07/28/2019 CLINICAL DATA:  85 year old male with history of dyspnea. COVID-19 positive. EXAM: PORTABLE CHEST 1 VIEW COMPARISON:  Chest x-ray 10/14/2017. FINDINGS: Patchy areas of interstitial prominence an ill-defined opacities noted throughout the mid to lower lungs bilaterally, concerning for multilobar pneumonia. Lung volumes are low. No pleural effusions. No evidence of pulmonary edema. Mild cardiomegaly. Upper mediastinal contours are within normal limits. Aortic atherosclerosis. Left-sided pacemaker device in place with lead tips projecting over the expected location of the right atrium and right ventricle. IMPRESSION: 1. Subtle changes compatible with multilobar bilateral pneumonia most evident throughout the mid to lower lungs bilaterally. 2. Mild cardiomegaly. 3. Aortic atherosclerosis. Electronically Signed   By: Vinnie Langton M.D.   On: 07/28/2019 13:58

## 2019-08-11 LAB — COMPREHENSIVE METABOLIC PANEL
ALT: 25 U/L (ref 0–44)
AST: 26 U/L (ref 15–41)
Albumin: 2.9 g/dL — ABNORMAL LOW (ref 3.5–5.0)
Alkaline Phosphatase: 58 U/L (ref 38–126)
Anion gap: 9 (ref 5–15)
BUN: 20 mg/dL (ref 8–23)
CO2: 27 mmol/L (ref 22–32)
Calcium: 8.7 mg/dL — ABNORMAL LOW (ref 8.9–10.3)
Chloride: 102 mmol/L (ref 98–111)
Creatinine, Ser: 0.78 mg/dL (ref 0.61–1.24)
GFR calc Af Amer: >60 mL/min (ref >60)
GFR calc non Af Amer: >60 mL/min (ref >60)
Glucose, Bld: 89 mg/dL (ref 70–99)
Potassium: 3.9 mmol/L (ref 3.5–5.1)
Sodium: 138 mmol/L (ref 135–145)
Total Bilirubin: 1.8 mg/dL — ABNORMAL HIGH (ref 0.3–1.2)
Total Protein: 5.3 g/dL — ABNORMAL LOW (ref 6.5–8.1)

## 2019-08-11 LAB — CBC WITH DIFFERENTIAL/PLATELET
Abs Immature Granulocytes: 0.04 10*3/uL (ref 0.00–0.07)
Basophils Absolute: 0 10*3/uL (ref 0.0–0.1)
Basophils Relative: 0 %
Eosinophils Absolute: 0.1 10*3/uL (ref 0.0–0.5)
Eosinophils Relative: 1 %
HCT: 40.3 % (ref 39.0–52.0)
Hemoglobin: 13.6 g/dL (ref 13.0–17.0)
Immature Granulocytes: 1 %
Lymphocytes Relative: 24 %
Lymphs Abs: 1.7 10*3/uL (ref 0.7–4.0)
MCH: 31.6 pg (ref 26.0–34.0)
MCHC: 33.7 g/dL (ref 30.0–36.0)
MCV: 93.5 fL (ref 80.0–100.0)
Monocytes Absolute: 0.5 10*3/uL (ref 0.1–1.0)
Monocytes Relative: 7 %
Neutro Abs: 4.8 10*3/uL (ref 1.7–7.7)
Neutrophils Relative %: 67 %
Platelets: 109 10*3/uL — ABNORMAL LOW (ref 150–400)
RBC: 4.31 MIL/uL (ref 4.22–5.81)
RDW: 13.2 % (ref 11.5–15.5)
WBC: 7.3 10*3/uL (ref 4.0–10.5)
nRBC: 0 % (ref 0.0–0.2)

## 2019-08-11 LAB — MAGNESIUM: Magnesium: 1.9 mg/dL (ref 1.7–2.4)

## 2019-08-11 LAB — PROCALCITONIN: Procalcitonin: <0.1 ng/mL

## 2019-08-11 LAB — C-REACTIVE PROTEIN: CRP: <0.5 mg/dL (ref <1.0)

## 2019-08-11 LAB — GLUCOSE, CAPILLARY
Glucose-Capillary: 120 mg/dL — ABNORMAL HIGH (ref 70–99)
Glucose-Capillary: 143 mg/dL — ABNORMAL HIGH (ref 70–99)
Glucose-Capillary: 102 mg/dL — ABNORMAL HIGH (ref 70–99)
Glucose-Capillary: 80 mg/dL (ref 70–99)

## 2019-08-11 LAB — D-DIMER, QUANTITATIVE: D-Dimer, Quant: 0.59 ug/mL-FEU — ABNORMAL HIGH (ref 0.00–0.50)

## 2019-08-11 LAB — BRAIN NATRIURETIC PEPTIDE: B Natriuretic Peptide: 59.5 pg/mL (ref 0.0–100.0)

## 2019-08-11 NOTE — Progress Notes (Signed)
   08/11/19 1042  Family/Significant Other Communication  Family/Significant Other Update Called;Updated (spouse)

## 2019-08-11 NOTE — Progress Notes (Signed)
PROGRESS NOTE                                                                                                                                                                                                             Patient Demographics:    Dylan [name], is a 85 y.o. male, DOB - 1926-03-27, [id number]  Outpatient Primary MD for the patient is Tisovec, Fransico Him, MD    LOS - 2  Admit date - 08/09/2019    Chief Complaint  Patient presents with  . Covid/weakness  . Cough       Brief Narrative - Dylan [name] is a 85 y.o. male with medical history significant of HTN; HLD;  PAF with pacemaker placement, on Eliquis; chronic diastolic CHF; DM; and a lung nodule who was recently admitted to [facility] with COVID-19-associated PNA, he was treated appropriately and sent home on room air.  At home he fine for the first few days and then he started falling due to weakness.   Subjective:    Patient in chair, appears comfortable, denies any headache, no fever, no chest pain or pressure, no shortness of breath , no abdominal pain. No focal weakness.   Assessment  & Plan :    1. Resolved and fully treated Covid 19 Viral Pneumonitis during the ongoing 2020 Covid 19 Pandemic - this [place] was appropriately treated last admission, no hypoxia at this time he is stable on room air and in no respiratory distress.  His only pulmonary problem is cough.  He was readmitted because of multiple falls at home, I had detailed discussions with patient's son who states that patient's PCP sent him to the ER and that ER then admitted him to the hospital for reasons unknown to him.  No further treatment for COVID-19 will be done this admission.  Encouraged the patient to sit up in chair in the daytime use I-S and flutter valve for pulmonary toiletry and then prone in bed when at night.    Recent Labs  Lab 08/09/19 0912 08/10/19 0240 08/11/19 0440    CRP 0.7 0.5 <0.5  DDIMER 0.95* 0.66* 0.59*  FERRITIN 484*  --   --   BNP  --  45.2 59.5  PROCALCITON <0.10 <0.10 <0.10    2.  Generalized weakness and multiple falls.  PT OT - SNF .  3.  Chronic A. fib and rate control.  Mali vas 2 score of greater than 3.  Continue combination of Coreg, amiodarone and Eliquis.  Patient has a indwelling pacemaker.  4.  Essential hypertension.  Was on Coreg, blood pressure soft when he sits up question if he has mild orthostasis, will add TED stockings and monitor.  He was falling at home and question if he is getting orthostatic.  Will monitor orthostatics.  Will hold Coreg for now  5.  Dyslipidemia.  On Crestor.  6.  Chronic diastolic CHF.  EF 60% on recent echocardiogram done in 2019.  Compensated.     Condition - Fair  Family Communication  :  Son 12/21, 12/22  Code Status :  DNR  Diet :   Diet Order            Diet heart healthy/carb modified Room service appropriate? Yes; Fluid consistency: Thin  Diet effective now               Disposition Plan  :  SNF  Consults  :  None  Procedures  :     PUD Prophylaxis :    DVT Prophylaxis  : Eliquis  Lab Results  Component Value Date   PLT 109 (L) 08/11/2019    Inpatient Medications  Scheduled Meds: . albuterol  2 puff Inhalation Q6H  . amiodarone  200 mg Oral Daily  . apixaban  5 mg Oral BID  . carvedilol  3.125 mg Oral BID WC  . insulin aspart  0-9 Units Subcutaneous TID WC  . rosuvastatin  10 mg Oral Daily  . sodium chloride flush  3 mL Intravenous Q12H   Continuous Infusions: . sodium chloride     PRN Meds:.sodium chloride, acetaminophen, bisacodyl, chlorpheniramine-HYDROcodone, guaiFENesin-dextromethorphan, [DISCONTINUED] ondansetron **OR** ondansetron (ZOFRAN) IV, polyethylene glycol, sodium chloride flush, sodium phosphate  Antibiotics  :    Anti-infectives (From admission, onward)   Start     Dose/Rate Route Frequency Ordered Stop   08/10/19 1130  vancomycin  (VANCOREADY) IVPB 1250 mg/250 mL  Status:  Discontinued     1,250 mg 166.7 mL/hr over 90 Minutes Intravenous Every 24 hours 08/09/19 1019 08/09/19 1648   08/09/19 1900  aztreonam (AZACTAM) 2 g in sodium chloride 0.9 % 100 mL IVPB  Status:  Discontinued     2 g 200 mL/hr over 30 Minutes Intravenous Every 8 hours 08/09/19 1127 08/09/19 1648   08/09/19 1030  vancomycin (VANCOREADY) IVPB 2000 mg/400 mL     2,000 mg 200 mL/hr over 120 Minutes Intravenous  Once 08/09/19 1019 08/09/19 1427   08/09/19 1015  aztreonam (AZACTAM) 2 g in sodium chloride 0.9 % 100 mL IVPB     2 g 200 mL/hr over 30 Minutes Intravenous  Once 08/09/19 1007 08/09/19 1131       Time Spent in minutes  30   Lala Lund M.D on 08/11/2019 at 11:15 AM  To page go to [email or web address] - password [email or web address]  [facility] -  Office  [phone]   See all Orders from today for further details    Objective:   Vitals:   08/11/19 0133 08/11/19 0400 08/11/19 0523 08/11/19 0835  BP: 137/64 139/75 (!) 145/69 (!) 97/57  Pulse: (!) 59 (!) 59 (!) 59 63  Resp: 15 11 16 20   Temp: 98.3 F (36.8 C) 97.7 F (36.5 C) 97.9 F (36.6 C) 98.5 F (36.9 C)  TempSrc: Oral Oral Oral Oral  SpO2: 93% 99% 97% 97%  Weight:      Height:        Wt Readings from Last 3 Encounters:  08/09/19 112 kg  07/28/19 113.4 kg  01/02/19 109.8 kg     Intake/Output Summary (Last 24 hours) at 08/11/2019 1115 Last data filed at 08/11/2019 0200 Gross per 24 hour  Intake --  Output 1325 ml  Net -1325 ml     Physical Exam  Awake Alert,   No new F.N deficits, ++ hard of hearing [place].AT,PERRAL Supple Neck,No JVD, No cervical lymphadenopathy appriciated.  Symmetrical Chest wall movement, Good air movement bilaterally, CTAB RRR,No Gallops, Rubs or new Murmurs, No Parasternal Heave +ve B.Sounds, Abd Soft, No tenderness, No organomegaly appriciated, No rebound - guarding or rigidity. No Cyanosis, Clubbing or edema, No new Rash or bruise     Data Review:    CBC Recent Labs  Lab 08/09/19 0912 08/10/19 0240 08/11/19 0440  WBC 18.3* 13.2* 7.3  HGB 15.6 13.6 13.6  HCT 45.6 41.4 40.3  PLT PLATELET CLUMPS NOTED ON SMEAR, COUNT APPEARS DECREASED 94* 109*  MCV 93.6 95.2 93.5  MCH 32.0 31.3 31.6  MCHC 34.2 32.9 33.7  RDW 12.9 13.2 13.2  LYMPHSABS 0.8 1.5 1.7  MONOABS 0.4 0.5 0.5  EOSABS 0.1 0.1 0.1  BASOSABS 0.0 0.0 0.0    Chemistries  Recent Labs  Lab 08/09/19 0912 08/10/19 0240 08/11/19 0440  NA 133* 134* 138  K 4.1 4.5 3.9  CL 100 98 102  CO2 23 24 27   GLUCOSE 95 73 89  BUN 27* 30* 20  CREATININE 1.10 1.05 0.78  CALCIUM 8.4* 8.5* 8.7*  MG  --  2.0 1.9  AST 23 22 26   ALT 24 21 25   ALKPHOS 63 62 58  BILITOT 1.8* 1.7* 1.8*   ------------------------------------------------------------------------------------------------------------------ Recent Labs    08/09/19 0912  TRIG 116    Lab Results  Component Value Date   HGBA1C 5.9 (H) 07/29/2019   ------------------------------------------------------------------------------------------------------------------ No results for input(s): TSH, T4TOTAL, T3FREE, THYROIDAB in the last 72 hours.  Invalid input(s): FREET3  Cardiac Enzymes No results for input(s): CKMB, TROPONINI, MYOGLOBIN in the last 168 hours.  Invalid input(s): CK ------------------------------------------------------------------------------------------------------------------    Component Value Date/Time   BNP 59.5 08/11/2019 0440    Micro Results Recent Results (from the past 240 hour(s))  Blood Culture (routine x 2)     Status: None (Preliminary result)   Collection Time: 08/09/19  9:14 AM   Specimen: BLOOD RIGHT ARM  Result Value Ref Range Status   Specimen Description BLOOD RIGHT ARM  Final   Special Requests   Final    BOTTLES DRAWN AEROBIC AND ANAEROBIC Blood Culture results may not be optimal due to an excessive volume of blood received in culture bottles   Culture   Final     NO GROWTH 2 DAYS Performed at [facility], [place] [address] [place], [place] [address]    Report Status PENDING  Incomplete  Blood Culture (routine x 2)     Status: None (Preliminary result)   Collection Time: 08/09/19  9:30 AM   Specimen: BLOOD RIGHT ARM  Result Value Ref Range Status   Specimen Description BLOOD RIGHT ARM  Final   Special Requests   Final    BOTTLES DRAWN AEROBIC AND ANAEROBIC Blood Culture results may not be optimal due to an inadequate volume of blood received in culture bottles   Culture   Final    NO GROWTH 2 DAYS Performed at [facility], [place] [address], [place], [place] [address]    Report Status PENDING  Incomplete    Radiology Reports CT Head Wo Contrast  Result Date: 08/09/2019 CLINICAL DATA:  Frequent falls for the past week. On Eliquis. Right leg weakness. EXAM: CT HEAD WITHOUT CONTRAST TECHNIQUE: Contiguous axial images were obtained from the base of the skull through the vertex without intravenous contrast. COMPARISON:  CT head dated July 09, 2018. FINDINGS: Brain: No evidence of acute infarction, hemorrhage, hydrocephalus, extra-axial collection or mass lesion/mass effect. Stable atrophy and chronic microvascular ischemic changes. Old small lacunar infarct in the right thalamus again noted. Vascular: Calcified atherosclerosis at the skullbase. No hyperdense vessel. Skull: Normal. Negative for fracture or focal lesion. Sinuses/Orbits: No acute finding.  Prior right mastoidectomy. Other: None. IMPRESSION: 1.  No acute intracranial abnormality. Electronically Signed   By: Titus Dubin M.D.   On: 08/09/2019 09:56   DG Chest Port 1 View  Result Date: 08/09/2019 CLINICAL DATA:  COVID-19 infection progressive cough, weakness and hypoxia. EXAM: PORTABLE CHEST 1 VIEW COMPARISON:  07/28/2019 FINDINGS: Cardiomediastinal contours are stable, dual lead left-sided pacer device in place power pack projecting over left chest. Lung volumes are  diminished compared to the previous exam with patchy bilateral basilar opacities. No signs of dense consolidation or evidence of pleural effusion. No acute bone process. IMPRESSION: Low lung volumes with patchy bilateral basilar opacities, new since the previous exam. Sequela of viral/atypical infection is considered. Electronically Signed   By: Zetta Bills M.D.   On: 08/09/2019 09:28   DG Chest Portable 1 View  Result Date: 07/28/2019 CLINICAL DATA:  85 year old male with history of dyspnea. COVID-19 positive. EXAM: PORTABLE CHEST 1 VIEW COMPARISON:  Chest x-ray 10/14/2017. FINDINGS: Patchy areas of interstitial prominence an ill-defined opacities noted throughout the mid to lower lungs bilaterally, concerning for multilobar pneumonia. Lung volumes are low. No pleural effusions. No evidence of pulmonary edema. Mild cardiomegaly. Upper mediastinal contours are within normal limits. Aortic atherosclerosis. Left-sided pacemaker device in place with lead tips projecting over the expected location of the right atrium and right ventricle. IMPRESSION: 1. Subtle changes compatible with multilobar bilateral pneumonia most evident throughout the mid to lower lungs bilaterally. 2. Mild cardiomegaly. 3. Aortic atherosclerosis. Electronically Signed   By: Vinnie Langton M.D.   On: 07/28/2019 13:58

## 2019-08-11 NOTE — Plan of Care (Signed)
  Problem: Education: Goal: Knowledge of risk factors and measures for prevention of condition will improve Outcome: Progressing   Problem: Health Behavior/Discharge Planning: Goal: Ability to manage health-related needs will improve Outcome: Progressing   Problem: Clinical Measurements: Goal: Ability to maintain clinical measurements within normal limits will improve Outcome: Progressing Goal: Will remain free from infection Outcome: Progressing Goal: Diagnostic test results will improve Outcome: Progressing Goal: Respiratory complications will improve Outcome: Progressing Goal: Cardiovascular complication will be avoided Outcome: Progressing

## 2019-08-11 NOTE — Progress Notes (Addendum)
Update: Patient and family agreeable to [facility] authorization has been started by facility   Voicemail left for spouse to discuss discharge plans- will follow up.   Kingsley Spittle, LCSW [facility] [place]  [phone]

## 2019-08-12 ENCOUNTER — Other Ambulatory Visit: Payer: Self-pay | Admitting: Internal Medicine

## 2019-08-12 DIAGNOSIS — U071 COVID-19: Secondary | ICD-10-CM

## 2019-08-12 DIAGNOSIS — J1282 Pneumonia due to coronavirus disease 2019: Secondary | ICD-10-CM

## 2019-08-12 LAB — CBC WITH DIFFERENTIAL/PLATELET
Abs Immature Granulocytes: 0.04 10*3/uL (ref 0.00–0.07)
Basophils Absolute: 0 10*3/uL (ref 0.0–0.1)
Basophils Relative: 0 %
Eosinophils Absolute: 0.1 10*3/uL (ref 0.0–0.5)
Eosinophils Relative: 1 %
HCT: 39.2 % (ref 39.0–52.0)
Hemoglobin: 13.1 g/dL (ref 13.0–17.0)
Immature Granulocytes: 1 %
Lymphocytes Relative: 30 %
Lymphs Abs: 2.2 10*3/uL (ref 0.7–4.0)
MCH: 31.1 pg (ref 26.0–34.0)
MCHC: 33.4 g/dL (ref 30.0–36.0)
MCV: 93.1 fL (ref 80.0–100.0)
Monocytes Absolute: 0.6 10*3/uL (ref 0.1–1.0)
Monocytes Relative: 8 %
Neutro Abs: 4.4 10*3/uL (ref 1.7–7.7)
Neutrophils Relative %: 60 %
Platelets: 126 10*3/uL — ABNORMAL LOW (ref 150–400)
RBC: 4.21 MIL/uL — ABNORMAL LOW (ref 4.22–5.81)
RDW: 13.2 % (ref 11.5–15.5)
WBC: 7.3 10*3/uL (ref 4.0–10.5)
nRBC: 0 % (ref 0.0–0.2)

## 2019-08-12 LAB — COMPREHENSIVE METABOLIC PANEL
ALT: 27 U/L (ref 0–44)
AST: 29 U/L (ref 15–41)
Albumin: 2.8 g/dL — ABNORMAL LOW (ref 3.5–5.0)
Alkaline Phosphatase: 55 U/L (ref 38–126)
Anion gap: 9 (ref 5–15)
BUN: 21 mg/dL (ref 8–23)
CO2: 25 mmol/L (ref 22–32)
Calcium: 8.6 mg/dL — ABNORMAL LOW (ref 8.9–10.3)
Chloride: 100 mmol/L (ref 98–111)
Creatinine, Ser: 0.87 mg/dL (ref 0.61–1.24)
GFR calc Af Amer: >60 mL/min (ref >60)
GFR calc non Af Amer: >60 mL/min (ref >60)
Glucose, Bld: 95 mg/dL (ref 70–99)
Potassium: 3.9 mmol/L (ref 3.5–5.1)
Sodium: 134 mmol/L — ABNORMAL LOW (ref 135–145)
Total Bilirubin: 1.6 mg/dL — ABNORMAL HIGH (ref 0.3–1.2)
Total Protein: 5.2 g/dL — ABNORMAL LOW (ref 6.5–8.1)

## 2019-08-12 LAB — C-REACTIVE PROTEIN: CRP: 0.5 mg/dL (ref <1.0)

## 2019-08-12 LAB — D-DIMER, QUANTITATIVE: D-Dimer, Quant: 0.62 ug/mL-FEU — ABNORMAL HIGH (ref 0.00–0.50)

## 2019-08-12 LAB — BRAIN NATRIURETIC PEPTIDE: B Natriuretic Peptide: 63.8 pg/mL (ref 0.0–100.0)

## 2019-08-12 LAB — GLUCOSE, CAPILLARY: Glucose-Capillary: 97 mg/dL (ref 70–99)

## 2019-08-12 LAB — MAGNESIUM: Magnesium: 1.9 mg/dL (ref 1.7–2.4)

## 2019-08-12 MED ORDER — MIDODRINE HCL 5 MG PO TABS
2.5000 mg | ORAL_TABLET | Freq: Three times a day (TID) | ORAL | Status: DC
  Administered 2019-08-12 – 2019-08-13 (×2): 2.5 mg via ORAL
  Filled 2019-08-12 (×2): qty 1

## 2019-08-12 MED ORDER — LACTATED RINGERS IV SOLN: INTRAVENOUS | Status: AC

## 2019-08-12 NOTE — Progress Notes (Signed)
Physical Therapy Treatment Patient Details Name: Dylan [name] MRN: [id number] DOB: 04-May-1926 Today's Date: 08/12/2019    History of Present Illness 85 y.o. male with PMH of HTN, HLD, PAF with pacemaker placement on Eliquis, CHF, DM, and a lung nodule who was recently admitted with COVID-19-associated PNA (12/8-13), treated with Decadron, Remdesivir, and Actemra x 1.  He reports that he has had persistent weakness since the time of discharge from [facility]; this is not improving and he has had several falls.  CXR with new infiltrates, readmitted 08/09/19 for treatment of possible PNA although suspected persistent COVID infection.   PT Comments    Pt progressing with mobility. Able to perform multiple bouts of standing activity and exercise with RW, requiring intermittent minA to correct LOB; frequent seated rest breaks due to fatigue. SpO2 88-92% on RA with activity. Based on current functional status, feel pt would benefit from SNF-level therapies to maximize functional mobility and independence prior to return home; pt and family in agreement, pt hopeful for discharge to SNF tomorrow.    Follow Up Recommendations  SNF;Supervision for mobility/OOB     Equipment Recommendations  None recommended by PT    Recommendations for Other Services       Precautions / Restrictions Precautions Precautions: Fall Precaution Comments: hx of falls PTA, since DC'd from [place]; RN reports symptomatic hypotension Restrictions Weight Bearing Restrictions: No    Mobility  Bed Mobility               General bed mobility comments: received sitting in recliner  Transfers Overall transfer level: Needs assistance Equipment used: Rolling walker (2 wheeled) Transfers: Sit to/from Stand Sit to Stand: Min assist;Min guard         General transfer comment: Multiple sit<>stands to RW from recliner, cues for hand placement, heavy reliance on BUE support to push into standing, initial minA for trunk  elevation, progressing to min guard for subsequent trials  Ambulation/Gait Ambulation/Gait assistance: Min guard;Min assist Gait Distance (Feet): 48 Feet Assistive device: Rolling walker (2 wheeled) Gait Pattern/deviations: Step-through pattern;Decreased stride length;Trunk flexed Gait velocity: decreased   General Gait Details: Mildly unsteady gait forwards/backwards with RW and close min guard for balance, minA to prevent LOB with backwards steps; frequent cues for upright posture. Intermittent seated rest breaks due to fatigue   Stairs             Wheelchair Mobility    Modified Rankin (Stroke Patients Only)       Balance Overall balance assessment: History of Falls;Needs assistance Sitting-balance support: Feet supported Sitting balance-[name]: Fair     Standing balance support: Bilateral upper extremity supported Standing balance-[name]: Poor Standing balance comment: reliant on UE support                            Cognition Arousal/Alertness: Awake/alert Behavior During Therapy: WFL for tasks assessed/performed Overall Cognitive Status: No family/caregiver present to determine baseline cognitive functioning Area of Impairment: Attention;Memory;Following commands;Awareness;Problem solving                   Current Attention Level: Selective Memory: Decreased short-term memory Following Commands: Follows one step commands consistently;Follows multi-step commands inconsistently   Awareness: Emergent Problem Solving: Slow processing;Requires verbal cues        Exercises Other Exercises Other Exercises: 5x sit<>stand; 10x incentive spirometer, 10x flutter valve (pt with great technique, did not require any cues)    General Comments General  comments (skin integrity, edema, etc.): SpO2 88-92% on RA with activity, 92-97% on 2L at rest. BP 113/55, post-mobility BP 106/61, pt asymptomatic      Pertinent Vitals/Pain Pain  Assessment: Faces Faces Pain Scale: Hurts a little bit Pain Location: Butt from sitting Pain Descriptors / Indicators: Numbness Pain Intervention(s): Monitored during session;Repositioned    Home Living                      Prior Function            PT Goals (current goals can now be found in the care plan section) Progress towards PT goals: Progressing toward goals    Frequency    Min 2X/week      PT Plan Frequency needs to be updated;Discharge plan needs to be updated    Co-evaluation              AM-PAC PT "6 Clicks" Mobility   Outcome Measure  Help needed turning from your back to your side while in a flat bed without using bedrails?: A Little Help needed moving from lying on your back to sitting on the side of a flat bed without using bedrails?: A Little Help needed moving to and from a bed to a chair (including a wheelchair)?: A Little Help needed standing up from a chair using your arms (e.g., wheelchair or bedside chair)?: A Little Help needed to walk in hospital room?: A Little Help needed climbing 3-5 steps with a railing? : A Lot 6 Click Score: 17    End of Session Equipment Utilized During Treatment: Gait belt Activity Tolerance: Patient tolerated treatment well Patient left: in chair;with call bell/phone within reach;with chair alarm set Nurse Communication: Mobility status PT Visit Diagnosis: Unsteadiness on feet (R26.81);Difficulty in walking, not elsewhere classified (R26.2)     Time: [id number] PT Time Calculation (min) (ACUTE ONLY): 29 min  Charges:  $Therapeutic Exercise: 23-37 mins                     Mabeline Caras, PT, DPT Acute Rehabilitation Services  Pager [phone] Office [phone]  Derry Lory 08/12/2019, 2:41 PM

## 2019-08-12 NOTE — Progress Notes (Signed)
PROGRESS NOTE                                                                                                                                                                                                             Patient Demographics:    Dylan [name], is a 85 y.o. male, DOB - Jan 25, 1926, [id number]  Outpatient Primary MD for the patient is Tisovec, Fransico Him, MD    LOS - 3  Admit date - 08/09/2019    Chief Complaint  Patient presents with  . Covid/weakness  . Cough       Brief Narrative - Dylan [name] is a 85 y.o. male with medical history significant of HTN; HLD;  PAF with pacemaker placement, on Eliquis; chronic diastolic CHF; DM; and a lung nodule who was recently admitted to [facility] with COVID-19-associated PNA, he was treated appropriately and sent home on room air.  At home he fine for the first few days and then he started falling due to weakness.   Subjective:   Patient in bed, appears comfortable, denies any headache, no fever, no chest pain or pressure, no shortness of breath , no abdominal pain. No focal weakness.   Assessment  & Plan :    1. Resolved and fully treated Covid 19 Viral Pneumonitis during the ongoing 2020 Covid 19 Pandemic - this [place] was appropriately treated last admission, no hypoxia at this time he is stable on room air and in no respiratory distress.  His only pulmonary problem is cough.  He was readmitted because of multiple falls at home, I had detailed discussions with patient's son who states that patient's PCP sent him to the ER and that ER then admitted him to the hospital for reasons unknown to him.  No further treatment for COVID-19 will be done this admission.  Encouraged the patient to sit up in chair in the daytime use I-S and flutter valve for pulmonary toiletry and then prone in bed when at night.    Recent Labs  Lab 08/09/19 0912 08/10/19 0240 08/11/19 0440  08/12/19 0035  CRP 0.7 0.5 <0.5 0.5  DDIMER 0.95* 0.66* 0.59* 0.62*  FERRITIN 484*  --   --   --   BNP  --  45.2 59.5 63.8  PROCALCITON <0.10 <0.10 <0.10  --     2.  Generalized  weakness and multiple falls.  Severely orthostatic, IV fluid bolus, midodrine and monitor, has been seen by PT OT and will require SNF, continue fall precautions here.  3.  Chronic A. fib and rate control.  Mali vas 2 score of greater than 3.  Continue combination of Coreg, amiodarone and Eliquis.  Patient has a indwelling pacemaker.  4.  Essential hypertension.  Was on Coreg, blood pressure soft when he sits up question if he has mild orthostasis, will add TED stockings and monitor.  He was falling at home and question if he is getting orthostatic.  Will monitor orthostatics.  Will hold Coreg for now  5.  Dyslipidemia.  On Crestor.  6.  Chronic diastolic CHF.  EF 60% on recent echocardiogram done in 2019.  Compensated.     Condition - Fair  Family Communication  :  Son 12/21, 12/22, wife on 12/23  Code Status :  DNR  Diet :   Diet Order            Diet heart healthy/carb modified Room service appropriate? Yes; Fluid consistency: Thin  Diet effective now               Disposition Plan  :  SNF  Consults  :  None  Procedures  :     PUD Prophylaxis :    DVT Prophylaxis  : Eliquis  Lab Results  Component Value Date   PLT 126 (L) 08/12/2019    Inpatient Medications  Scheduled Meds: . albuterol  2 puff Inhalation Q6H  . amiodarone  200 mg Oral Daily  . apixaban  5 mg Oral BID  . midodrine  2.5 mg Oral TID WC  . rosuvastatin  10 mg Oral Daily  . sodium chloride flush  3 mL Intravenous Q12H   Continuous Infusions: . sodium chloride    . lactated ringers     PRN Meds:.sodium chloride, acetaminophen, bisacodyl, chlorpheniramine-HYDROcodone, guaiFENesin-dextromethorphan, [DISCONTINUED] ondansetron **OR** ondansetron (ZOFRAN) IV, polyethylene glycol, sodium chloride flush, sodium  phosphate  Antibiotics  :    Anti-infectives (From admission, onward)   Start     Dose/Rate Route Frequency Ordered Stop   08/10/19 1130  vancomycin (VANCOREADY) IVPB 1250 mg/250 mL  Status:  Discontinued     1,250 mg 166.7 mL/hr over 90 Minutes Intravenous Every 24 hours 08/09/19 1019 08/09/19 1648   08/09/19 1900  aztreonam (AZACTAM) 2 g in sodium chloride 0.9 % 100 mL IVPB  Status:  Discontinued     2 g 200 mL/hr over 30 Minutes Intravenous Every 8 hours 08/09/19 1127 08/09/19 1648   08/09/19 1030  vancomycin (VANCOREADY) IVPB 2000 mg/400 mL     2,000 mg 200 mL/hr over 120 Minutes Intravenous  Once 08/09/19 1019 08/09/19 1427   08/09/19 1015  aztreonam (AZACTAM) 2 g in sodium chloride 0.9 % 100 mL IVPB     2 g 200 mL/hr over 30 Minutes Intravenous  Once 08/09/19 1007 08/09/19 1131       Time Spent in minutes  30   Lala Lund M.D on 08/12/2019 at 1:32 PM  To page go to [email or web address] - password [email or web address]  [facility] -  Office  [phone]   See all Orders from today for further details    Objective:   Vitals:   08/11/19 2009 08/12/19 0434 08/12/19 0800 08/12/19 1100  BP: 125/71 108/64 122/65 (!) 104/45  Pulse: 63 60 61 62  Resp: 19 15 18    Temp: 97.7 F (36.5 C) 98.2 F (  36.8 C) 98.1 F (36.7 C) 98 F (36.7 C)  TempSrc: Oral Oral Oral Oral  SpO2: 92% 90% 90% 92%  Weight:      Height:        Wt Readings from Last 3 Encounters:  08/09/19 112 kg  07/28/19 113.4 kg  01/02/19 109.8 kg     Intake/Output Summary (Last 24 hours) at 08/12/2019 1332 Last data filed at 08/12/2019 1100 Gross per 24 hour  Intake 240 ml  Output 580 ml  Net -340 ml     Physical Exam  Awake Alert, in no distress, extremely hard of hearing [place].AT,PERRAL Supple Neck,No JVD, No cervical lymphadenopathy appriciated.  Symmetrical Chest wall movement, Good air movement bilaterally, CTAB RRR,No Gallops, Rubs or new Murmurs, No Parasternal Heave +ve B.Sounds, Abd Soft, No  tenderness, No organomegaly appriciated, No rebound - guarding or rigidity. No Cyanosis, Clubbing or edema, No new Rash or bruise     Data Review:    CBC Recent Labs  Lab 08/09/19 0912 08/10/19 0240 08/11/19 0440 08/12/19 0035  WBC 18.3* 13.2* 7.3 7.3  HGB 15.6 13.6 13.6 13.1  HCT 45.6 41.4 40.3 39.2  PLT PLATELET CLUMPS NOTED ON SMEAR, COUNT APPEARS DECREASED 94* 109* 126*  MCV 93.6 95.2 93.5 93.1  MCH 32.0 31.3 31.6 31.1  MCHC 34.2 32.9 33.7 33.4  RDW 12.9 13.2 13.2 13.2  LYMPHSABS 0.8 1.5 1.7 2.2  MONOABS 0.4 0.5 0.5 0.6  EOSABS 0.1 0.1 0.1 0.1  BASOSABS 0.0 0.0 0.0 0.0    Chemistries  Recent Labs  Lab 08/09/19 0912 08/10/19 0240 08/11/19 0440 08/12/19 0035  NA 133* 134* 138 134*  K 4.1 4.5 3.9 3.9  CL 100 98 102 100  CO2 23 24 27 25   GLUCOSE 95 73 89 95  BUN 27* 30* 20 21  CREATININE 1.10 1.05 0.78 0.87  CALCIUM 8.4* 8.5* 8.7* 8.6*  MG  --  2.0 1.9 1.9  AST 23 22 26 29   ALT 24 21 25 27   ALKPHOS 63 62 58 55  BILITOT 1.8* 1.7* 1.8* 1.6*   ------------------------------------------------------------------------------------------------------------------ No results for input(s): CHOL, HDL, LDLCALC, TRIG, CHOLHDL, LDLDIRECT in the last 72 hours.  Lab Results  Component Value Date   HGBA1C 5.9 (H) 07/29/2019   ------------------------------------------------------------------------------------------------------------------ No results for input(s): TSH, T4TOTAL, T3FREE, THYROIDAB in the last 72 hours.  Invalid input(s): FREET3  Cardiac Enzymes No results for input(s): CKMB, TROPONINI, MYOGLOBIN in the last 168 hours.  Invalid input(s): CK ------------------------------------------------------------------------------------------------------------------    Component Value Date/Time   BNP 63.8 08/12/2019 0035    Micro Results Recent Results (from the past 240 hour(s))  Blood Culture (routine x 2)     Status: None (Preliminary result)   Collection  Time: 08/09/19  9:14 AM   Specimen: BLOOD RIGHT ARM  Result Value Ref Range Status   Specimen Description BLOOD RIGHT ARM  Final   Special Requests   Final    BOTTLES DRAWN AEROBIC AND ANAEROBIC Blood Culture results may not be optimal due to an excessive volume of blood received in culture bottles   Culture   Final    NO GROWTH 3 DAYS Performed at [facility], [place] [address], [place], [place] [address]    Report Status PENDING  Incomplete  Blood Culture (routine x 2)     Status: None (Preliminary result)   Collection Time: 08/09/19  9:30 AM   Specimen: BLOOD RIGHT ARM  Result Value Ref Range Status   Specimen Description  BLOOD RIGHT ARM  Final   Special Requests   Final    BOTTLES DRAWN AEROBIC AND ANAEROBIC Blood Culture results may not be optimal due to an inadequate volume of blood received in culture bottles   Culture   Final    NO GROWTH 3 DAYS Performed at [facility], [place] [address] [place], [place] [address]    Report Status PENDING  Incomplete    Radiology Reports CT Head Wo Contrast  Result Date: 08/09/2019 CLINICAL DATA:  Frequent falls for the past week. On Eliquis. Right leg weakness. EXAM: CT HEAD WITHOUT CONTRAST TECHNIQUE: Contiguous axial images were obtained from the base of the skull through the vertex without intravenous contrast. COMPARISON:  CT head dated July 09, 2018. FINDINGS: Brain: No evidence of acute infarction, hemorrhage, hydrocephalus, extra-axial collection or mass lesion/mass effect. Stable atrophy and chronic microvascular ischemic changes. Old small lacunar infarct in the right thalamus again noted. Vascular: Calcified atherosclerosis at the skullbase. No hyperdense vessel. Skull: Normal. Negative for fracture or focal lesion. Sinuses/Orbits: No acute finding.  Prior right mastoidectomy. Other: None. IMPRESSION: 1.  No acute intracranial abnormality. Electronically Signed   By: Titus Dubin M.D.   On: 08/09/2019 09:56   DG  Chest Port 1 View  Result Date: 08/09/2019 CLINICAL DATA:  COVID-19 infection progressive cough, weakness and hypoxia. EXAM: PORTABLE CHEST 1 VIEW COMPARISON:  07/28/2019 FINDINGS: Cardiomediastinal contours are stable, dual lead left-sided pacer device in place power pack projecting over left chest. Lung volumes are diminished compared to the previous exam with patchy bilateral basilar opacities. No signs of dense consolidation or evidence of pleural effusion. No acute bone process. IMPRESSION: Low lung volumes with patchy bilateral basilar opacities, new since the previous exam. Sequela of viral/atypical infection is considered. Electronically Signed   By: Zetta Bills M.D.   On: 08/09/2019 09:28   DG Chest Portable 1 View  Result Date: 07/28/2019 CLINICAL DATA:  85 year old male with history of dyspnea. COVID-19 positive. EXAM: PORTABLE CHEST 1 VIEW COMPARISON:  Chest x-ray 10/14/2017. FINDINGS: Patchy areas of interstitial prominence an ill-defined opacities noted throughout the mid to lower lungs bilaterally, concerning for multilobar pneumonia. Lung volumes are low. No pleural effusions. No evidence of pulmonary edema. Mild cardiomegaly. Upper mediastinal contours are within normal limits. Aortic atherosclerosis. Left-sided pacemaker device in place with lead tips projecting over the expected location of the right atrium and right ventricle. IMPRESSION: 1. Subtle changes compatible with multilobar bilateral pneumonia most evident throughout the mid to lower lungs bilaterally. 2. Mild cardiomegaly. 3. Aortic atherosclerosis. Electronically Signed   By: Vinnie Langton M.D.   On: 07/28/2019 13:58

## 2019-08-13 ENCOUNTER — Telehealth: Payer: Self-pay

## 2019-08-13 LAB — CBC WITH DIFFERENTIAL/PLATELET
Abs Immature Granulocytes: 0.04 10*3/uL (ref 0.00–0.07)
Basophils Absolute: 0 10*3/uL (ref 0.0–0.1)
Basophils Relative: 0 %
Eosinophils Absolute: 0.1 10*3/uL (ref 0.0–0.5)
Eosinophils Relative: 2 %
HCT: 39.4 % (ref 39.0–52.0)
Hemoglobin: 13.2 g/dL (ref 13.0–17.0)
Immature Granulocytes: 1 %
Lymphocytes Relative: 34 %
Lymphs Abs: 2.5 10*3/uL (ref 0.7–4.0)
MCH: 31.8 pg (ref 26.0–34.0)
MCHC: 33.5 g/dL (ref 30.0–36.0)
MCV: 94.9 fL (ref 80.0–100.0)
Monocytes Absolute: 0.6 10*3/uL (ref 0.1–1.0)
Monocytes Relative: 8 %
Neutro Abs: 4.2 10*3/uL (ref 1.7–7.7)
Neutrophils Relative %: 55 %
Platelets: 129 10*3/uL — ABNORMAL LOW (ref 150–400)
RBC: 4.15 MIL/uL — ABNORMAL LOW (ref 4.22–5.81)
RDW: 13.3 % (ref 11.5–15.5)
WBC: 7.5 10*3/uL (ref 4.0–10.5)
nRBC: 0 % (ref 0.0–0.2)

## 2019-08-13 LAB — BRAIN NATRIURETIC PEPTIDE: B Natriuretic Peptide: 72.1 pg/mL (ref 0.0–100.0)

## 2019-08-13 LAB — COMPREHENSIVE METABOLIC PANEL
ALT: 31 U/L (ref 0–44)
AST: 26 U/L (ref 15–41)
Albumin: 2.9 g/dL — ABNORMAL LOW (ref 3.5–5.0)
Alkaline Phosphatase: 59 U/L (ref 38–126)
Anion gap: 9 (ref 5–15)
BUN: 21 mg/dL (ref 8–23)
CO2: 25 mmol/L (ref 22–32)
Calcium: 8.8 mg/dL — ABNORMAL LOW (ref 8.9–10.3)
Chloride: 103 mmol/L (ref 98–111)
Creatinine, Ser: 0.88 mg/dL (ref 0.61–1.24)
GFR calc Af Amer: >60 mL/min (ref >60)
GFR calc non Af Amer: >60 mL/min (ref >60)
Glucose, Bld: 84 mg/dL (ref 70–99)
Potassium: 3.9 mmol/L (ref 3.5–5.1)
Sodium: 137 mmol/L (ref 135–145)
Total Bilirubin: 1.9 mg/dL — ABNORMAL HIGH (ref 0.3–1.2)
Total Protein: 5.4 g/dL — ABNORMAL LOW (ref 6.5–8.1)

## 2019-08-13 LAB — D-DIMER, QUANTITATIVE: D-Dimer, Quant: 0.62 ug/mL-FEU — ABNORMAL HIGH (ref 0.00–0.50)

## 2019-08-13 LAB — C-REACTIVE PROTEIN: CRP: 0.5 mg/dL (ref <1.0)

## 2019-08-13 LAB — MAGNESIUM: Magnesium: 1.8 mg/dL (ref 1.7–2.4)

## 2019-08-13 MED ORDER — LACTATED RINGERS IV SOLN: INTRAVENOUS | Status: DC

## 2019-08-13 MED ORDER — MIDODRINE HCL 5 MG PO TABS
5.0000 mg | ORAL_TABLET | Freq: Three times a day (TID) | ORAL | Status: DC
  Administered 2019-08-13: 5 mg via ORAL
  Filled 2019-08-13: qty 1

## 2019-08-13 MED ORDER — MIDODRINE HCL 5 MG PO TABS
2.5000 mg | ORAL_TABLET | Freq: Three times a day (TID) | ORAL | Status: DC
  Administered 2019-08-13 – 2019-08-17 (×10): 2.5 mg via ORAL
  Filled 2019-08-13 (×12): qty 1

## 2019-08-13 NOTE — Telephone Encounter (Signed)
The pt wife states the pt is in the hospital and she is getting text and emails about turning the app on. I gave her the number to [place] support.

## 2019-08-13 NOTE — Care Management Important Message (Signed)
Important Message  Patient Details  Name: VERDIS WARFIELD MRN: [id number] Date of Birth: 1926/03/04   Medicare Important Message Given:  Yes - Important Message mailed due to current National Emergency  Verbal consent obtained due to current National Emergency  Relationship to patient: Spouse/Significant Other Contact Name: Lani Beavers Call Date: 08/13/19  Time: 1020 Phone: [phone] Outcome: Spoke with contact Important Message mailed to: Patient address on file    Delorse Lek 08/13/2019, 10:34 AM

## 2019-08-13 NOTE — Progress Notes (Signed)
Notified spouse Romie Minus) of progress all questions answered and this nurse's number shared for further communication. Patient talked on the phone with his wife.

## 2019-08-13 NOTE — Progress Notes (Signed)
PROGRESS NOTE                                                                                                                                                                                                             Patient Demographics:    Dylan [name], is a 85 y.o. male, DOB - 1926-05-28, QO:[id number]  Outpatient Primary MD for the patient is Tisovec, Fransico Him, MD    LOS - 4  Admit date - 08/09/2019    Chief Complaint  Patient presents with  . Covid/weakness  . Cough       Brief Narrative - Dylan [name] is a 85 y.o. male with medical history significant of HTN; HLD;  PAF with pacemaker placement, on Eliquis; chronic diastolic CHF; DM; and a lung nodule who was recently admitted to [facility] with COVID-19-associated PNA, he was treated appropriately and sent home on room air.  At home he fine for the first few days and then he started falling due to weakness.   Subjective:   Patient in bed, appears comfortable, denies any headache, no fever, no chest pain or pressure, no shortness of breath , no abdominal pain. No focal weakness.   Assessment  & Plan :    1. Resolved and fully treated Covid 19 Viral Pneumonitis during the ongoing 2020 Covid 19 Pandemic - this [place] was appropriately treated last admission, no hypoxia at this time he is stable on room air and in no respiratory distress.  His only pulmonary problem is cough.  He was readmitted because of multiple falls at home, I had detailed discussions with patient's son who states that patient's PCP sent him to the ER and that ER then admitted him to the hospital for reasons unknown to him.  No further treatment for COVID-19 will be done this admission.  Encouraged the patient to sit up in chair in the daytime use I-S and flutter valve for pulmonary toiletry and then prone in bed when at night.    Recent Labs  Lab 08/09/19 0912 08/10/19 0240 08/11/19 0440  08/12/19 0035 08/13/19 0250  CRP 0.7 0.5 <0.5 0.5 0.5  DDIMER 0.95* 0.66* 0.59* 0.62* 0.62*  FERRITIN 484*  --   --   --   --   BNP  --  45.2 59.5 63.8 72.1  PROCALCITON <0.10 <0.10 <0.10  --   --  2.  Generalized weakness and multiple falls.  Severely orthostatic, IV fluid bolus, midodrine and monitor, has been seen by PT OT and will require SNF, continue fall precautions here.  3.  Chronic A. fib and rate control.  Mali vas 2 score of greater than 3.  Continue combination of Coreg, amiodarone and Eliquis.  Patient has a indwelling pacemaker.  4.  Essential hypertension in supine position with severe orthostatic hypotension upon standing up.  Was on Coreg which has been held, hydrated, TED stockings, midodrine and monitor.  May require walker and fall precautions for the rest of his life if orthostasis does not improve with above measures.  5.  Dyslipidemia.  On Crestor.  6.  Chronic diastolic CHF.  EF 60% on recent echocardiogram done in 2019.  Compensated.     Condition - Fair  Family Communication  :  Son 12/21, 12/22, wife on 12/23, message left for son on his voicemail on 08/13/2019 10:25 AM  Code Status :  DNR  Diet :   Diet Order            Diet heart healthy/carb modified Room service appropriate? Yes; Fluid consistency: Thin  Diet effective now               Disposition Plan  :  SNF  Consults  :  None  Procedures  :     PUD Prophylaxis :    DVT Prophylaxis  : Eliquis  Lab Results  Component Value Date   PLT 129 (L) 08/13/2019    Inpatient Medications  Scheduled Meds: . albuterol  2 puff Inhalation Q6H  . amiodarone  200 mg Oral Daily  . apixaban  5 mg Oral BID  . midodrine  5 mg Oral TID WC  . rosuvastatin  10 mg Oral Daily  . sodium chloride flush  3 mL Intravenous Q12H   Continuous Infusions: . sodium chloride     PRN Meds:.sodium chloride, acetaminophen, bisacodyl, chlorpheniramine-HYDROcodone, guaiFENesin-dextromethorphan,  [DISCONTINUED] ondansetron **OR** ondansetron (ZOFRAN) IV, polyethylene glycol, sodium chloride flush, sodium phosphate  Antibiotics  :    Anti-infectives (From admission, onward)   Start     Dose/Rate Route Frequency Ordered Stop   08/10/19 1130  vancomycin (VANCOREADY) IVPB 1250 mg/250 mL  Status:  Discontinued     1,250 mg 166.7 mL/hr over 90 Minutes Intravenous Every 24 hours 08/09/19 1019 08/09/19 1648   08/09/19 1900  aztreonam (AZACTAM) 2 g in sodium chloride 0.9 % 100 mL IVPB  Status:  Discontinued     2 g 200 mL/hr over 30 Minutes Intravenous Every 8 hours 08/09/19 1127 08/09/19 1648   08/09/19 1030  vancomycin (VANCOREADY) IVPB 2000 mg/400 mL     2,000 mg 200 mL/hr over 120 Minutes Intravenous  Once 08/09/19 1019 08/09/19 1427   08/09/19 1015  aztreonam (AZACTAM) 2 g in sodium chloride 0.9 % 100 mL IVPB     2 g 200 mL/hr over 30 Minutes Intravenous  Once 08/09/19 1007 08/09/19 1131       Time Spent in minutes  30   Lala Lund M.D on 08/13/2019 at 10:25 AM  To page go to [email or web address] - password [email or web address]  [facility] -  Office  [phone]   See all Orders from today for further details    Objective:   Vitals:   08/12/19 1700 08/12/19 1956 08/13/19 0432 08/13/19 0748  BP: 136/68 (!) 148/68 122/60 (!) 141/76  Pulse: 60 61 60   Resp: 16 12 13  Temp: 97.7 F (36.5 C) 97.7 F (36.5 C) 97.8 F (36.6 C) 98 F (36.7 C)  TempSrc: Oral Oral Oral Oral  SpO2: 90% 90% 91%   Weight:      Height:        Wt Readings from Last 3 Encounters:  08/09/19 112 kg  07/28/19 113.4 kg  01/02/19 109.8 kg     Intake/Output Summary (Last 24 hours) at 08/13/2019 1025 Last data filed at 08/13/2019 0535 Gross per 24 hour  Intake 240 ml  Output 430 ml  Net -190 ml     Physical Exam  Awake Alert,  No new F.N deficits, Normal affect [place].AT,PERRAL Supple Neck,No JVD, No cervical lymphadenopathy appriciated.  Symmetrical Chest wall movement, Good air movement  bilaterally, CTAB RRR,No Gallops, Rubs or new Murmurs, No Parasternal Heave +ve B.Sounds, Abd Soft, No tenderness, No organomegaly appriciated, No rebound - guarding or rigidity. No Cyanosis, Clubbing or edema, No new Rash or bruise    Data Review:    CBC Recent Labs  Lab 08/09/19 0912 08/10/19 0240 08/11/19 0440 08/12/19 0035 08/13/19 0250  WBC 18.3* 13.2* 7.3 7.3 7.5  HGB 15.6 13.6 13.6 13.1 13.2  HCT 45.6 41.4 40.3 39.2 39.4  PLT PLATELET CLUMPS NOTED ON SMEAR, COUNT APPEARS DECREASED 94* 109* 126* 129*  MCV 93.6 95.2 93.5 93.1 94.9  MCH 32.0 31.3 31.6 31.1 31.8  MCHC 34.2 32.9 33.7 33.4 33.5  RDW 12.9 13.2 13.2 13.2 13.3  LYMPHSABS 0.8 1.5 1.7 2.2 2.5  MONOABS 0.4 0.5 0.5 0.6 0.6  EOSABS 0.1 0.1 0.1 0.1 0.1  BASOSABS 0.0 0.0 0.0 0.0 0.0    Chemistries  Recent Labs  Lab 08/09/19 0912 08/10/19 0240 08/11/19 0440 08/12/19 0035 08/13/19 0250  NA 133* 134* 138 134* 137  K 4.1 4.5 3.9 3.9 3.9  CL 100 98 102 100 103  CO2 23 24 27 25 25   GLUCOSE 95 73 89 95 84  BUN 27* 30* 20 21 21   CREATININE 1.10 1.05 0.78 0.87 0.88  CALCIUM 8.4* 8.5* 8.7* 8.6* 8.8*  MG  --  2.0 1.9 1.9 1.8  AST 23 22 26 29 26   ALT 24 21 25 27 31   ALKPHOS 63 62 58 55 59  BILITOT 1.8* 1.7* 1.8* 1.6* 1.9*   ------------------------------------------------------------------------------------------------------------------ No results for input(s): CHOL, HDL, LDLCALC, TRIG, CHOLHDL, LDLDIRECT in the last 72 hours.  Lab Results  Component Value Date   HGBA1C 5.9 (H) 07/29/2019   ------------------------------------------------------------------------------------------------------------------ No results for input(s): TSH, T4TOTAL, T3FREE, THYROIDAB in the last 72 hours.  Invalid input(s): FREET3  Cardiac Enzymes No results for input(s): CKMB, TROPONINI, MYOGLOBIN in the last 168 hours.  Invalid input(s):  CK ------------------------------------------------------------------------------------------------------------------    Component Value Date/Time   BNP 72.1 08/13/2019 0250    Micro Results Recent Results (from the past 240 hour(s))  Blood Culture (routine x 2)     Status: None (Preliminary result)   Collection Time: 08/09/19  9:14 AM   Specimen: BLOOD RIGHT ARM  Result Value Ref Range Status   Specimen Description BLOOD RIGHT ARM  Final   Special Requests   Final    BOTTLES DRAWN AEROBIC AND ANAEROBIC Blood Culture results may not be optimal due to an excessive volume of blood received in culture bottles   Culture   Final    NO GROWTH 4 DAYS Performed at [facility], [place] [address], [place], [place] [address]    Report Status PENDING  Incomplete  Blood Culture (  routine x 2)     Status: None (Preliminary result)   Collection Time: 08/09/19  9:30 AM   Specimen: BLOOD RIGHT ARM  Result Value Ref Range Status   Specimen Description BLOOD RIGHT ARM  Final   Special Requests   Final    BOTTLES DRAWN AEROBIC AND ANAEROBIC Blood Culture results may not be optimal due to an inadequate volume of blood received in culture bottles   Culture   Final    NO GROWTH 4 DAYS Performed at [facility], [place] [address], [place], [place] [address]    Report Status PENDING  Incomplete    Radiology Reports CT Head Wo Contrast  Result Date: 08/09/2019 CLINICAL DATA:  Frequent falls for the past week. On Eliquis. Right leg weakness. EXAM: CT HEAD WITHOUT CONTRAST TECHNIQUE: Contiguous axial images were obtained from the base of the skull through the vertex without intravenous contrast. COMPARISON:  CT head dated July 09, 2018. FINDINGS: Brain: No evidence of acute infarction, hemorrhage, hydrocephalus, extra-axial collection or mass lesion/mass effect. Stable atrophy and chronic microvascular ischemic changes. Old small lacunar infarct in the right thalamus again noted. Vascular:  Calcified atherosclerosis at the skullbase. No hyperdense vessel. Skull: Normal. Negative for fracture or focal lesion. Sinuses/Orbits: No acute finding.  Prior right mastoidectomy. Other: None. IMPRESSION: 1.  No acute intracranial abnormality. Electronically Signed   By: Titus Dubin M.D.   On: 08/09/2019 09:56   DG Chest Port 1 View  Result Date: 08/09/2019 CLINICAL DATA:  COVID-19 infection progressive cough, weakness and hypoxia. EXAM: PORTABLE CHEST 1 VIEW COMPARISON:  07/28/2019 FINDINGS: Cardiomediastinal contours are stable, dual lead left-sided pacer device in place power pack projecting over left chest. Lung volumes are diminished compared to the previous exam with patchy bilateral basilar opacities. No signs of dense consolidation or evidence of pleural effusion. No acute bone process. IMPRESSION: Low lung volumes with patchy bilateral basilar opacities, new since the previous exam. Sequela of viral/atypical infection is considered. Electronically Signed   By: Zetta Bills M.D.   On: 08/09/2019 09:28   DG Chest Portable 1 View  Result Date: 07/28/2019 CLINICAL DATA:  85 year old male with history of dyspnea. COVID-19 positive. EXAM: PORTABLE CHEST 1 VIEW COMPARISON:  Chest x-ray 10/14/2017. FINDINGS: Patchy areas of interstitial prominence an ill-defined opacities noted throughout the mid to lower lungs bilaterally, concerning for multilobar pneumonia. Lung volumes are low. No pleural effusions. No evidence of pulmonary edema. Mild cardiomegaly. Upper mediastinal contours are within normal limits. Aortic atherosclerosis. Left-sided pacemaker device in place with lead tips projecting over the expected location of the right atrium and right ventricle. IMPRESSION: 1. Subtle changes compatible with multilobar bilateral pneumonia most evident throughout the mid to lower lungs bilaterally. 2. Mild cardiomegaly. 3. Aortic atherosclerosis. Electronically Signed   By: Vinnie Langton M.D.   On:  07/28/2019 13:58

## 2019-08-14 LAB — CULTURE, BLOOD (ROUTINE X 2)
Culture: NO GROWTH
Culture: NO GROWTH

## 2019-08-14 LAB — TSH: TSH: 2.471 u[IU]/mL (ref 0.350–4.500)

## 2019-08-14 LAB — CORTISOL: Cortisol, Plasma: 17.5 ug/dL

## 2019-08-14 MED ORDER — LACTATED RINGERS IV SOLN: INTRAVENOUS | Status: DC

## 2019-08-14 NOTE — Progress Notes (Signed)
Notified spouse Romie Minus) of progress all questions answered and this nurse's number shared for further communication.

## 2019-08-14 NOTE — Progress Notes (Signed)
PROGRESS NOTE                                                                                                                                                                                                             Patient Demographics:    Dylan [name], is a 85 y.o. male, DOB - 08-04-1926, [id number]  Outpatient Primary MD for the patient is Tisovec, Fransico Him, MD    LOS - 5  Admit date - 08/09/2019    Chief Complaint  Patient presents with  . Covid/weakness  . Cough       Brief Narrative - Dylan [name] is a 85 y.o. male with medical history significant of HTN; HLD;  PAF with pacemaker placement, on Eliquis; chronic diastolic CHF; DM; and a lung nodule who was recently admitted to [facility] with COVID-19-associated PNA, he was treated appropriately and sent home on room air.  At home he fine for the first few days and then he started falling due to weakness.   Subjective:   Patient in bed, appears comfortable, denies any headache, no fever, no chest pain or pressure, no shortness of breath , no abdominal pain. No focal weakness.    Assessment  & Plan :    1. Resolved and fully treated Covid 19 Viral Pneumonitis during the ongoing 2020 Covid 19 Pandemic - this [place] was appropriately treated last admission, no hypoxia at this time he is stable on room air and in no respiratory distress.  His only pulmonary problem is cough.  He was readmitted because of multiple falls at home, I had detailed discussions with patient's son who states that patient's PCP sent him to the ER and that ER then admitted him to the hospital for reasons unknown to him.  No further treatment for COVID-19 will be done this admission.  Encouraged the patient to sit up in chair in the daytime use I-S and flutter valve for pulmonary toiletry and then prone in bed when at night.    Recent Labs  Lab 08/09/19 0912 08/10/19 0240 08/11/19 0440  08/12/19 0035 08/13/19 0250  CRP 0.7 0.5 <0.5 0.5 0.5  DDIMER 0.95* 0.66* 0.59* 0.62* 0.62*  FERRITIN 484*  --   --   --   --   BNP  --  45.2 59.5 63.8 72.1  PROCALCITON <0.10 <0.10 <0.10  --   --  2.  Generalized weakness and multiple falls.  Severely orthostatic, TED stocking,  IV fluid repeat continue on midodrine and monitor, has been seen by PT OT and will require SNF, continue fall precautions here.Likely chronic age related Autonomic dysfunction.  3.  Chronic A. fib and rate control.  Mali vas 2 score of greater than 3.  Continue combination of Coreg, amiodarone and Eliquis.  Patient has a indwelling pacemaker.  4.  Essential hypertension in supine position with severe orthostatic hypotension upon standing up.  Was on Coreg which has been held, hydrated, TED stockings, midodrine and monitor.  May require walker and fall precautions for the rest of his life if orthostasis does not improve with above measures.  5.  Dyslipidemia.  On Crestor.  6.  Chronic diastolic CHF.  EF 60% on recent echocardiogram done in 2019.  Compensated.     Condition - Fair  Family Communication  :  Son 12/21, 12/22, wife on 12/23, message left for son on his voicemail on 08/13/2019 10:25 AM  Code Status :  DNR  Diet :   Diet Order            Diet heart healthy/carb modified Room service appropriate? Yes; Fluid consistency: Thin  Diet effective now               Disposition Plan  :  SNF - await bed availability   Consults  :  None  Procedures  :     PUD Prophylaxis :    DVT Prophylaxis  : Eliquis  Lab Results  Component Value Date   PLT 129 (L) 08/13/2019    Inpatient Medications  Scheduled Meds: . albuterol  2 puff Inhalation Q6H  . amiodarone  200 mg Oral Daily  . apixaban  5 mg Oral BID  . midodrine  2.5 mg Oral TID WC  . rosuvastatin  10 mg Oral Daily  . sodium chloride flush  3 mL Intravenous Q12H   Continuous Infusions: . sodium chloride    . lactated ringers       PRN Meds:.sodium chloride, acetaminophen, bisacodyl, chlorpheniramine-HYDROcodone, guaiFENesin-dextromethorphan, [DISCONTINUED] ondansetron **OR** ondansetron (ZOFRAN) IV, polyethylene glycol, sodium chloride flush, sodium phosphate  Antibiotics  :    Anti-infectives (From admission, onward)   Start     Dose/Rate Route Frequency Ordered Stop   08/10/19 1130  vancomycin (VANCOREADY) IVPB 1250 mg/250 mL  Status:  Discontinued     1,250 mg 166.7 mL/hr over 90 Minutes Intravenous Every 24 hours 08/09/19 1019 08/09/19 1648   08/09/19 1900  aztreonam (AZACTAM) 2 g in sodium chloride 0.9 % 100 mL IVPB  Status:  Discontinued     2 g 200 mL/hr over 30 Minutes Intravenous Every 8 hours 08/09/19 1127 08/09/19 1648   08/09/19 1030  vancomycin (VANCOREADY) IVPB 2000 mg/400 mL     2,000 mg 200 mL/hr over 120 Minutes Intravenous  Once 08/09/19 1019 08/09/19 1427   08/09/19 1015  aztreonam (AZACTAM) 2 g in sodium chloride 0.9 % 100 mL IVPB     2 g 200 mL/hr over 30 Minutes Intravenous  Once 08/09/19 1007 08/09/19 1131       Time Spent in minutes  30   Lala Lund M.D on 08/14/2019 at 9:11 AM  To page go to [email or web address] - password [facility]  [facility] -  Office  [phone]   See all Orders from today for further details    Objective:   Vitals:   08/13/19 1800 08/13/19 2000 08/14/19 [id number]  08/14/19 0740  BP: (!) 158/89 121/67 134/71   Pulse: 63 62 (!) 59   Resp: 14 15    Temp:   98 F (36.7 C) 98.1 F (36.7 C)  TempSrc:   Oral Oral  SpO2: 91% 97% 95%   Weight:      Height:        Wt Readings from Last 3 Encounters:  08/09/19 112 kg  07/28/19 113.4 kg  01/02/19 109.8 kg     Intake/Output Summary (Last 24 hours) at 08/14/2019 0911 Last data filed at 08/14/2019 0303 Gross per 24 hour  Intake 240 ml  Output 900 ml  Net -660 ml     Physical Exam  Awake Alert,  No new F.N deficits, hard of hearing  .AT,PERRAL Supple Neck,No JVD, No cervical lymphadenopathy  appriciated.  Symmetrical Chest wall movement, Good air movement bilaterally, CTAB RRR,No Gallops, Rubs or new Murmurs, No Parasternal Heave +ve B.Sounds, Abd Soft, No tenderness, No organomegaly appriciated, No rebound - guarding or rigidity. No Cyanosis, Clubbing or edema, No new Rash or bruise     Data Review:    CBC Recent Labs  Lab 08/09/19 0912 08/10/19 0240 08/11/19 0440 08/12/19 0035 08/13/19 0250  WBC 18.3* 13.2* 7.3 7.3 7.5  HGB 15.6 13.6 13.6 13.1 13.2  HCT 45.6 41.4 40.3 39.2 39.4  PLT PLATELET CLUMPS NOTED ON SMEAR, COUNT APPEARS DECREASED 94* 109* 126* 129*  MCV 93.6 95.2 93.5 93.1 94.9  MCH 32.0 31.3 31.6 31.1 31.8  MCHC 34.2 32.9 33.7 33.4 33.5  RDW 12.9 13.2 13.2 13.2 13.3  LYMPHSABS 0.8 1.5 1.7 2.2 2.5  MONOABS 0.4 0.5 0.5 0.6 0.6  EOSABS 0.1 0.1 0.1 0.1 0.1  BASOSABS 0.0 0.0 0.0 0.0 0.0    Chemistries  Recent Labs  Lab 08/09/19 0912 08/10/19 0240 08/11/19 0440 08/12/19 0035 08/13/19 0250  NA 133* 134* 138 134* 137  K 4.1 4.5 3.9 3.9 3.9  CL 100 98 102 100 103  CO2 23 24 27 25 25   GLUCOSE 95 73 89 95 84  BUN 27* 30* 20 21 21   CREATININE 1.10 1.05 0.78 0.87 0.88  CALCIUM 8.4* 8.5* 8.7* 8.6* 8.8*  MG  --  2.0 1.9 1.9 1.8  AST 23 22 26 29 26   ALT 24 21 25 27 31   ALKPHOS 63 62 58 55 59  BILITOT 1.8* 1.7* 1.8* 1.6* 1.9*   ------------------------------------------------------------------------------------------------------------------ No results for input(s): CHOL, HDL, LDLCALC, TRIG, CHOLHDL, LDLDIRECT in the last 72 hours.  Lab Results  Component Value Date   HGBA1C 5.9 (H) 07/29/2019   ------------------------------------------------------------------------------------------------------------------ No results for input(s): TSH, T4TOTAL, T3FREE, THYROIDAB in the last 72 hours.  Invalid input(s): FREET3  Cardiac Enzymes No results for input(s): CKMB, TROPONINI, MYOGLOBIN in the last 168 hours.  Invalid input(s):  CK ------------------------------------------------------------------------------------------------------------------    Component Value Date/Time   BNP 72.1 08/13/2019 0250    Micro Results Recent Results (from the past 240 hour(s))  Blood Culture (routine x 2)     Status: None   Collection Time: 08/09/19  9:14 AM   Specimen: BLOOD RIGHT ARM  Result Value Ref Range Status   Specimen Description BLOOD RIGHT ARM  Final   Special Requests   Final    BOTTLES DRAWN AEROBIC AND ANAEROBIC Blood Culture results may not be optimal due to an excessive volume of blood received in culture bottles   Culture   Final    NO GROWTH 5 DAYS Performed at [facility],  [address], [place], [place] [address]    Report Status 08/14/2019 FINAL  Final  Blood Culture (routine x 2)     Status: None   Collection Time: 08/09/19  9:30 AM   Specimen: BLOOD RIGHT ARM  Result Value Ref Range Status   Specimen Description BLOOD RIGHT ARM  Final   Special Requests   Final    BOTTLES DRAWN AEROBIC AND ANAEROBIC Blood Culture results may not be optimal due to an inadequate volume of blood received in culture bottles   Culture   Final    NO GROWTH 5 DAYS Performed at [facility], [place] [address] [place], [place] [address]    Report Status 08/14/2019 FINAL  Final    Radiology Reports CT Head Wo Contrast  Result Date: 08/09/2019 CLINICAL DATA:  Frequent falls for the past week. On Eliquis. Right leg weakness. EXAM: CT HEAD WITHOUT CONTRAST TECHNIQUE: Contiguous axial images were obtained from the base of the skull through the vertex without intravenous contrast. COMPARISON:  CT head dated July 09, 2018. FINDINGS: Brain: No evidence of acute infarction, hemorrhage, hydrocephalus, extra-axial collection or mass lesion/mass effect. Stable atrophy and chronic microvascular ischemic changes. Old small lacunar infarct in the right thalamus again noted. Vascular: Calcified atherosclerosis at the  skullbase. No hyperdense vessel. Skull: Normal. Negative for fracture or focal lesion. Sinuses/Orbits: No acute finding.  Prior right mastoidectomy. Other: None. IMPRESSION: 1.  No acute intracranial abnormality. Electronically Signed   By: Titus Dubin M.D.   On: 08/09/2019 09:56   DG Chest Port 1 View  Result Date: 08/09/2019 CLINICAL DATA:  COVID-19 infection progressive cough, weakness and hypoxia. EXAM: PORTABLE CHEST 1 VIEW COMPARISON:  07/28/2019 FINDINGS: Cardiomediastinal contours are stable, dual lead left-sided pacer device in place power pack projecting over left chest. Lung volumes are diminished compared to the previous exam with patchy bilateral basilar opacities. No signs of dense consolidation or evidence of pleural effusion. No acute bone process. IMPRESSION: Low lung volumes with patchy bilateral basilar opacities, new since the previous exam. Sequela of viral/atypical infection is considered. Electronically Signed   By: Zetta Bills M.D.   On: 08/09/2019 09:28   DG Chest Portable 1 View  Result Date: 07/28/2019 CLINICAL DATA:  85 year old male with history of dyspnea. COVID-19 positive. EXAM: PORTABLE CHEST 1 VIEW COMPARISON:  Chest x-ray 10/14/2017. FINDINGS: Patchy areas of interstitial prominence an ill-defined opacities noted throughout the mid to lower lungs bilaterally, concerning for multilobar pneumonia. Lung volumes are low. No pleural effusions. No evidence of pulmonary edema. Mild cardiomegaly. Upper mediastinal contours are within normal limits. Aortic atherosclerosis. Left-sided pacemaker device in place with lead tips projecting over the expected location of the right atrium and right ventricle. IMPRESSION: 1. Subtle changes compatible with multilobar bilateral pneumonia most evident throughout the mid to lower lungs bilaterally. 2. Mild cardiomegaly. 3. Aortic atherosclerosis. Electronically Signed   By: Vinnie Langton M.D.   On: 07/28/2019 13:58

## 2019-08-15 MED ORDER — METOPROLOL TARTRATE 25 MG PO TABS
25.0000 mg | ORAL_TABLET | Freq: Two times a day (BID) | ORAL | Status: DC
  Administered 2019-08-15 – 2019-08-16 (×3): 25 mg via ORAL
  Filled 2019-08-15 (×3): qty 1

## 2019-08-15 NOTE — Progress Notes (Signed)
PROGRESS NOTE                                                                                                                                                                                                             Patient Demographics:    Dylan [name], is a 85 y.o. male, DOB - February 10, 1926, [id number]  Outpatient Primary MD for the patient is Tisovec, Fransico Him, MD    LOS - 6  Admit date - 08/09/2019    Chief Complaint  Patient presents with  . Covid/weakness  . Cough       Brief Narrative - Dylan [name] is a 85 y.o. male with medical history significant of HTN; HLD;  PAF with pacemaker placement, on Eliquis; chronic diastolic CHF; DM; and a lung nodule who was recently admitted to [facility] with COVID-19-associated PNA, he was treated appropriately and sent home on room air.  At home he fine for the first few days and then he started falling due to weakness.   Subjective:   Patient in bed, appears comfortable, denies any headache, no fever, no chest pain or pressure, no shortness of breath , no abdominal pain. No focal weakness.    Assessment  & Plan :    1. Resolved and fully treated Covid 19 Viral Pneumonitis during the ongoing 2020 Covid 19 Pandemic - this [place] was appropriately treated last admission, no hypoxia at this time he is stable on room air and in no respiratory distress.  His only pulmonary problem is cough.  He was readmitted because of multiple falls at home, I had detailed discussions with patient's son who states that patient's PCP sent him to the ER and that ER then admitted him to the hospital for reasons unknown to him.  No further treatment for COVID-19 will be done this admission.  Encouraged the patient to sit up in chair in the daytime use I-S and flutter valve for pulmonary toiletry and then prone in bed when at night.    Recent Labs  Lab 08/09/19 0912 08/10/19 0240 08/11/19 0440  08/12/19 0035 08/13/19 0250  CRP 0.7 0.5 <0.5 0.5 0.5  DDIMER 0.95* 0.66* 0.59* 0.62* 0.62*  FERRITIN 484*  --   --   --   --   BNP  --  45.2 59.5 63.8 72.1  PROCALCITON <0.10 <0.10 <0.10  --   --  2.  Generalized weakness and multiple falls.  Severely orthostatic, despite maximal treatment with IV fluids, midodrine and TED stockings he remains orthostatic but now asymptomatic, stable TSH and random cortisol.  This is most likely chronic age related Autonomic dysfunction, will add low-dose beta-blocker to see if it helps and orthostasis.  3.  Chronic A. fib and rate control.  Mali vas 2 score of greater than 3.  Continue combination of Coreg, amiodarone and Eliquis.  Patient has a indwelling pacemaker.  4.  Essential hypertension in supine position with severe orthostatic hypotension upon standing up.  Was on Coreg which has been held, hydrated, TED stockings, midodrine and monitor.  May require walker and fall precautions for the rest of his life if orthostasis does not improve with above measures.  5.  Dyslipidemia.  On Crestor.  6.  Chronic diastolic CHF.  EF 60% on recent echocardiogram done in 2019.  Compensated.     Condition - Fair  Family Communication  :  Son 12/21, 12/22, wife on 12/23, message left for son on his voicemail on 08/13/2019 10:25 AM  Code Status :  DNR  Diet :   Diet Order            Diet heart healthy/carb modified Room service appropriate? Yes; Fluid consistency: Thin  Diet effective now               Disposition Plan  :  SNF - await bed availability   Consults  :  None  Procedures  :     PUD Prophylaxis :    DVT Prophylaxis  : Eliquis  Lab Results  Component Value Date   PLT 129 (L) 08/13/2019    Inpatient Medications  Scheduled Meds: . albuterol  2 puff Inhalation Q6H  . amiodarone  200 mg Oral Daily  . apixaban  5 mg Oral BID  . midodrine  2.5 mg Oral TID WC  . rosuvastatin  10 mg Oral Daily  . sodium chloride flush  3 mL  Intravenous Q12H   Continuous Infusions: . sodium chloride     PRN Meds:.sodium chloride, acetaminophen, bisacodyl, chlorpheniramine-HYDROcodone, guaiFENesin-dextromethorphan, [DISCONTINUED] ondansetron **OR** ondansetron (ZOFRAN) IV, polyethylene glycol, sodium chloride flush, sodium phosphate  Antibiotics  :    Anti-infectives (From admission, onward)   Start     Dose/Rate Route Frequency Ordered Stop   08/10/19 1130  vancomycin (VANCOREADY) IVPB 1250 mg/250 mL  Status:  Discontinued     1,250 mg 166.7 mL/hr over 90 Minutes Intravenous Every 24 hours 08/09/19 1019 08/09/19 1648   08/09/19 1900  aztreonam (AZACTAM) 2 g in sodium chloride 0.9 % 100 mL IVPB  Status:  Discontinued     2 g 200 mL/hr over 30 Minutes Intravenous Every 8 hours 08/09/19 1127 08/09/19 1648   08/09/19 1030  vancomycin (VANCOREADY) IVPB 2000 mg/400 mL     2,000 mg 200 mL/hr over 120 Minutes Intravenous  Once 08/09/19 1019 08/09/19 1427   08/09/19 1015  aztreonam (AZACTAM) 2 g in sodium chloride 0.9 % 100 mL IVPB     2 g 200 mL/hr over 30 Minutes Intravenous  Once 08/09/19 1007 08/09/19 1131       Time Spent in minutes  30   Lala Lund M.D on 08/15/2019 at 10:11 AM  To page go to [email or web address] - password [facility]  [facility] -  Office  [phone]   See all Orders from today for further details    Objective:   Vitals:   08/14/19  1940 08/14/19 1941 08/15/19 0500 08/15/19 0817  BP: (!) 147/72  (!) 148/75 (!) 147/74  Pulse: 61 61 63 62  Resp: 16 13 16 15   Temp: 98.1 F (36.7 C)  97.9 F (36.6 C) 98.1 F (36.7 C)  TempSrc: Oral  Oral Oral  SpO2: 92% 95% 92% 90%  Weight:      Height:        Wt Readings from Last 3 Encounters:  08/09/19 112 kg  07/28/19 113.4 kg  01/02/19 109.8 kg     Intake/Output Summary (Last 24 hours) at 08/15/2019 1011 Last data filed at 08/15/2019 0500 Gross per 24 hour  Intake 830 ml  Output 1150 ml  Net -320 ml     Physical Exam  Awake Alert,  hard of hearing, no focal deficits [place].AT,PERRAL Supple Neck,No JVD, No cervical lymphadenopathy appriciated.  Symmetrical Chest wall movement, Good air movement bilaterally, CTAB RRR,No Gallops, Rubs or new Murmurs, No Parasternal Heave +ve B.Sounds, Abd Soft, No tenderness, No organomegaly appriciated, No rebound - guarding or rigidity. No Cyanosis, Clubbing or edema, No new Rash or bruise    Data Review:    CBC Recent Labs  Lab 08/09/19 0912 08/10/19 0240 08/11/19 0440 08/12/19 0035 08/13/19 0250  WBC 18.3* 13.2* 7.3 7.3 7.5  HGB 15.6 13.6 13.6 13.1 13.2  HCT 45.6 41.4 40.3 39.2 39.4  PLT PLATELET CLUMPS NOTED ON SMEAR, COUNT APPEARS DECREASED 94* 109* 126* 129*  MCV 93.6 95.2 93.5 93.1 94.9  MCH 32.0 31.3 31.6 31.1 31.8  MCHC 34.2 32.9 33.7 33.4 33.5  RDW 12.9 13.2 13.2 13.2 13.3  LYMPHSABS 0.8 1.5 1.7 2.2 2.5  MONOABS 0.4 0.5 0.5 0.6 0.6  EOSABS 0.1 0.1 0.1 0.1 0.1  BASOSABS 0.0 0.0 0.0 0.0 0.0    Chemistries  Recent Labs  Lab 08/09/19 0912 08/10/19 0240 08/11/19 0440 08/12/19 0035 08/13/19 0250  NA 133* 134* 138 134* 137  K 4.1 4.5 3.9 3.9 3.9  CL 100 98 102 100 103  CO2 23 24 27 25 25   GLUCOSE 95 73 89 95 84  BUN 27* 30* 20 21 21   CREATININE 1.10 1.05 0.78 0.87 0.88  CALCIUM 8.4* 8.5* 8.7* 8.6* 8.8*  MG  --  2.0 1.9 1.9 1.8  AST 23 22 26 29 26   ALT 24 21 25 27 31   ALKPHOS 63 62 58 55 59  BILITOT 1.8* 1.7* 1.8* 1.6* 1.9*   ------------------------------------------------------------------------------------------------------------------ No results for input(s): CHOL, HDL, LDLCALC, TRIG, CHOLHDL, LDLDIRECT in the last 72 hours.  Lab Results  Component Value Date   HGBA1C 5.9 (H) 07/29/2019   ------------------------------------------------------------------------------------------------------------------ Recent Labs    08/14/19 0820  TSH 2.471    Cardiac Enzymes No results for input(s): CKMB, TROPONINI, MYOGLOBIN in the last 168  hours.  Invalid input(s): CK ------------------------------------------------------------------------------------------------------------------    Component Value Date/Time   BNP 72.1 08/13/2019 0250    Micro Results Recent Results (from the past 240 hour(s))  Blood Culture (routine x 2)     Status: None   Collection Time: 08/09/19  9:14 AM   Specimen: BLOOD RIGHT ARM  Result Value Ref Range Status   Specimen Description BLOOD RIGHT ARM  Final   Special Requests   Final    BOTTLES DRAWN AEROBIC AND ANAEROBIC Blood Culture results may not be optimal due to an excessive volume of blood received in culture bottles   Culture   Final    NO GROWTH 5 DAYS Performed at [facility],  [address], [place], [place] [address]    Report Status 08/14/2019 FINAL  Final  Blood Culture (routine x 2)     Status: None   Collection Time: 08/09/19  9:30 AM   Specimen: BLOOD RIGHT ARM  Result Value Ref Range Status   Specimen Description BLOOD RIGHT ARM  Final   Special Requests   Final    BOTTLES DRAWN AEROBIC AND ANAEROBIC Blood Culture results may not be optimal due to an inadequate volume of blood received in culture bottles   Culture   Final    NO GROWTH 5 DAYS Performed at [facility], [place] [address], [place], [place] [address]    Report Status 08/14/2019 FINAL  Final    Radiology Reports CT Head Wo Contrast  Result Date: 08/09/2019 CLINICAL DATA:  Frequent falls for the past week. On Eliquis. Right leg weakness. EXAM: CT HEAD WITHOUT CONTRAST TECHNIQUE: Contiguous axial images were obtained from the base of the skull through the vertex without intravenous contrast. COMPARISON:  CT head dated July 09, 2018. FINDINGS: Brain: No evidence of acute infarction, hemorrhage, hydrocephalus, extra-axial collection or mass lesion/mass effect. Stable atrophy and chronic microvascular ischemic changes. Old small lacunar infarct in the right thalamus again noted. Vascular: Calcified  atherosclerosis at the skullbase. No hyperdense vessel. Skull: Normal. Negative for fracture or focal lesion. Sinuses/Orbits: No acute finding.  Prior right mastoidectomy. Other: None. IMPRESSION: 1.  No acute intracranial abnormality. Electronically Signed   By: Titus Dubin M.D.   On: 08/09/2019 09:56   DG Chest Port 1 View  Result Date: 08/09/2019 CLINICAL DATA:  COVID-19 infection progressive cough, weakness and hypoxia. EXAM: PORTABLE CHEST 1 VIEW COMPARISON:  07/28/2019 FINDINGS: Cardiomediastinal contours are stable, dual lead left-sided pacer device in place power pack projecting over left chest. Lung volumes are diminished compared to the previous exam with patchy bilateral basilar opacities. No signs of dense consolidation or evidence of pleural effusion. No acute bone process. IMPRESSION: Low lung volumes with patchy bilateral basilar opacities, new since the previous exam. Sequela of viral/atypical infection is considered. Electronically Signed   By: Zetta Bills M.D.   On: 08/09/2019 09:28   DG Chest Portable 1 View  Result Date: 07/28/2019 CLINICAL DATA:  85 year old male with history of dyspnea. COVID-19 positive. EXAM: PORTABLE CHEST 1 VIEW COMPARISON:  Chest x-ray 10/14/2017. FINDINGS: Patchy areas of interstitial prominence an ill-defined opacities noted throughout the mid to lower lungs bilaterally, concerning for multilobar pneumonia. Lung volumes are low. No pleural effusions. No evidence of pulmonary edema. Mild cardiomegaly. Upper mediastinal contours are within normal limits. Aortic atherosclerosis. Left-sided pacemaker device in place with lead tips projecting over the expected location of the right atrium and right ventricle. IMPRESSION: 1. Subtle changes compatible with multilobar bilateral pneumonia most evident throughout the mid to lower lungs bilaterally. 2. Mild cardiomegaly. 3. Aortic atherosclerosis. Electronically Signed   By: Vinnie Langton M.D.   On: 07/28/2019  13:58

## 2019-08-16 LAB — CBC
HCT: 39.1 % (ref 39.0–52.0)
Hemoglobin: 12.9 g/dL — ABNORMAL LOW (ref 13.0–17.0)
MCH: 31.6 pg (ref 26.0–34.0)
MCHC: 33 g/dL (ref 30.0–36.0)
MCV: 95.8 fL (ref 80.0–100.0)
Platelets: 150 10*3/uL (ref 150–400)
RBC: 4.08 MIL/uL — ABNORMAL LOW (ref 4.22–5.81)
RDW: 13.6 % (ref 11.5–15.5)
WBC: 6.5 10*3/uL (ref 4.0–10.5)
nRBC: 0 % (ref 0.0–0.2)

## 2019-08-16 MED ORDER — LOPERAMIDE HCL 2 MG PO CAPS
2.0000 mg | ORAL_CAPSULE | Freq: Four times a day (QID) | ORAL | Status: DC | PRN
  Administered 2019-08-16 (×2): 2 mg via ORAL
  Filled 2019-08-16 (×2): qty 1

## 2019-08-16 NOTE — Progress Notes (Signed)
PROGRESS NOTE                                                                                                                                                                                                             Patient Demographics:    Dylan [name], is a 85 y.o. male, DOB - 1926-04-26, [id number]  Outpatient Primary MD for the patient is Tisovec, Fransico Him, MD    LOS - 7  Admit date - 08/09/2019    Chief Complaint  Patient presents with   Covid/weakness   Cough       Brief Narrative - Dylan [name] is a 85 y.o. male with medical history significant of HTN; HLD;  PAF with pacemaker placement, on Eliquis; chronic diastolic CHF; DM; and a lung nodule who was recently admitted to [facility] with COVID-19-associated PNA, he was treated appropriately and sent home on room air.  At home he fine for the first few days and then he started falling due to weakness.   Subjective:   Patient in bed, appears comfortable, denies any headache, no fever, no chest pain or pressure, no shortness of breath , no abdominal pain. No focal weakness.   Assessment  & Plan :    1. Resolved and fully treated Covid 19 Viral Pneumonitis during the ongoing 2020 Covid 19 Pandemic - this [place] was appropriately treated last admission, no hypoxia at this time he is stable on room air and in no respiratory distress.  His only pulmonary problem is cough.  He was readmitted because of multiple falls at home, I had detailed discussions with patient's son who states that patient's PCP sent him to the ER and that ER then admitted him to the hospital for reasons unknown to him.  No further treatment for COVID-19 will be done this admission.  Encouraged the patient to sit up in chair in the daytime use I-S and flutter valve for pulmonary toiletry and then prone in bed when at night.    Recent Labs  Lab 08/10/19 0240 08/11/19 0440 08/12/19 0035  08/13/19 0250  CRP 0.5 <0.5 0.5 0.5  DDIMER 0.66* 0.59* 0.62* 0.62*  BNP 45.2 59.5 63.8 72.1  PROCALCITON <0.10 <0.10  --   --     2.  Generalized weakness and multiple falls.  Severely orthostatic, despite maximal treatment with IV  fluids, midodrine and TED stockings he remains orthostatic but now asymptomatic, stable TSH and random cortisol.  This is most likely chronic age related Autonomic dysfunction, will add low-dose beta-blocker to see if it helps and orthostasis.  3.  Chronic A. fib and rate control.  Mali vas 2 score of greater than 3.  Continue combination of Coreg, amiodarone and Eliquis.  Patient has a indwelling pacemaker.  4.  Essential hypertension in supine position with severe orthostatic hypotension upon standing up.  Was on Coreg which has been held, hydrated, TED stockings, midodrine and monitor.  May require walker and fall precautions for the rest of his life if orthostasis does not improve with above measures.  5.  Dyslipidemia.  On Crestor.  6.  Chronic diastolic CHF.  EF 60% on recent echocardiogram done in 2019.  Compensated.     Condition - Fair  Family Communication  :  Son 12/21, 12/22, wife on 12/23, message left for son on his voicemail on 08/13/2019 10:25 AM  Code Status :  DNR  Diet :   Diet Order            Diet heart healthy/carb modified Room service appropriate? Yes; Fluid consistency: Thin  Diet effective now               Disposition Plan  :  SNF - await bed availability   Consults  :  None  Procedures  :     PUD Prophylaxis :    DVT Prophylaxis  : Eliquis  Lab Results  Component Value Date   PLT 150 08/16/2019    Inpatient Medications  Scheduled Meds:  albuterol  2 puff Inhalation Q6H   amiodarone  200 mg Oral Daily   apixaban  5 mg Oral BID   metoprolol tartrate  25 mg Oral BID   midodrine  2.5 mg Oral TID WC   rosuvastatin  10 mg Oral Daily   sodium chloride flush  3 mL Intravenous Q12H   Continuous  Infusions:  sodium chloride     PRN Meds:.sodium chloride, acetaminophen, bisacodyl, chlorpheniramine-HYDROcodone, guaiFENesin-dextromethorphan, [DISCONTINUED] ondansetron **OR** ondansetron (ZOFRAN) IV, polyethylene glycol, sodium chloride flush, sodium phosphate  Antibiotics  :    Anti-infectives (From admission, onward)   Start     Dose/Rate Route Frequency Ordered Stop   08/10/19 1130  vancomycin (VANCOREADY) IVPB 1250 mg/250 mL  Status:  Discontinued     1,250 mg 166.7 mL/hr over 90 Minutes Intravenous Every 24 hours 08/09/19 1019 08/09/19 1648   08/09/19 1900  aztreonam (AZACTAM) 2 g in sodium chloride 0.9 % 100 mL IVPB  Status:  Discontinued     2 g 200 mL/hr over 30 Minutes Intravenous Every 8 hours 08/09/19 1127 08/09/19 1648   08/09/19 1030  vancomycin (VANCOREADY) IVPB 2000 mg/400 mL     2,000 mg 200 mL/hr over 120 Minutes Intravenous  Once 08/09/19 1019 08/09/19 1427   08/09/19 1015  aztreonam (AZACTAM) 2 g in sodium chloride 0.9 % 100 mL IVPB     2 g 200 mL/hr over 30 Minutes Intravenous  Once 08/09/19 1007 08/09/19 1131       Time Spent in minutes  30   Lala Lund M.D on 08/16/2019 at 9:49 AM  To page go to [email or web address] - password [facility]  [facility] -  Office  [phone]   See all Orders from today for further details    Objective:   Vitals:   08/15/19 1950 08/15/19 2105 08/16/19 0355 08/16/19 [id number]  BP: (!) 134/58  139/65   Pulse: 63 60 61   Resp: 14  15   Temp: 97.6 F (36.4 C)  98.4 F (36.9 C) (!) 97.5 F (36.4 C)  TempSrc: Oral  Oral Oral  SpO2: 97%  94%   Weight:      Height:        Wt Readings from Last 3 Encounters:  08/09/19 112 kg  07/28/19 113.4 kg  01/02/19 109.8 kg     Intake/Output Summary (Last 24 hours) at 08/16/2019 0949 Last data filed at 08/16/2019 0300 Gross per 24 hour  Intake 480 ml  Output 500 ml  Net -20 ml     Physical Exam  Awake Alert, extreme hearing loss, no focal  deficits Dylan [name],Dylan [name] Supple Neck,No JVD, No cervical lymphadenopathy appriciated.  Symmetrical Chest wall movement, Good air movement bilaterally, CTAB RRR,No Gallops, Rubs or new Murmurs, No Parasternal Heave +ve B.Sounds, Abd Soft, No tenderness, No organomegaly appriciated, No rebound - guarding or rigidity. No Cyanosis, Clubbing or edema, No new Rash or bruise    Data Review:    CBC Recent Labs  Lab 08/10/19 0240 08/11/19 0440 08/12/19 0035 08/13/19 0250 08/16/19 0218  WBC 13.2* 7.3 7.3 7.5 6.5  HGB 13.6 13.6 13.1 13.2 12.9*  HCT 41.4 40.3 39.2 39.4 39.1  PLT 94* 109* 126* 129* 150  MCV 95.2 93.5 93.1 94.9 95.8  MCH 31.3 31.6 31.1 31.8 31.6  MCHC 32.9 33.7 33.4 33.5 33.0  RDW 13.2 13.2 13.2 13.3 13.6  LYMPHSABS 1.5 1.7 2.2 2.5  --   MONOABS 0.5 0.5 0.6 0.6  --   EOSABS 0.1 0.1 0.1 0.1  --   BASOSABS 0.0 0.0 0.0 0.0  --     Chemistries  Recent Labs  Lab 08/10/19 0240 08/11/19 0440 08/12/19 0035 08/13/19 0250  NA 134* 138 134* 137  K 4.5 3.9 3.9 3.9  CL 98 102 100 103  CO2 24 27 25 25   GLUCOSE 73 89 95 84  BUN 30* 20 21 21   CREATININE 1.05 0.78 0.87 0.88  CALCIUM 8.5* 8.7* 8.6* 8.8*  MG 2.0 1.9 1.9 1.8  AST 22 26 29 26   ALT 21 25 27 31   ALKPHOS 62 58 55 59  BILITOT 1.7* 1.8* 1.6* 1.9*   ------------------------------------------------------------------------------------------------------------------ No results for input(s): CHOL, HDL, LDLCALC, TRIG, CHOLHDL, LDLDIRECT in the last 72 hours.  Lab Results  Component Value Date   HGBA1C 5.9 (H) 07/29/2019   ------------------------------------------------------------------------------------------------------------------ Recent Labs    08/14/19 0820  TSH 2.471    Cardiac Enzymes No results for input(s): CKMB, TROPONINI, MYOGLOBIN in the last 168 hours.  Invalid input(s): CK ------------------------------------------------------------------------------------------------------------------     Component Value Date/Time   BNP 72.1 08/13/2019 0250    Micro Results Recent Results (from the past 240 hour(s))  Blood Culture (routine x 2)     Status: None   Collection Time: 08/09/19  9:14 AM   Specimen: BLOOD RIGHT ARM  Result Value Ref Range Status   Specimen Description BLOOD RIGHT ARM  Final   Special Requests   Final    BOTTLES DRAWN AEROBIC AND ANAEROBIC Blood Culture results may not be optimal due to an excessive volume of blood received in culture bottles   Culture   Final    NO GROWTH 5 DAYS Performed at [facility], [place] [address], [place], [place] [address]    Report Status 08/14/2019 FINAL  Final  Blood Culture (routine x 2)  Status: None   Collection Time: 08/09/19  9:30 AM   Specimen: BLOOD RIGHT ARM  Result Value Ref Range Status   Specimen Description BLOOD RIGHT ARM  Final   Special Requests   Final    BOTTLES DRAWN AEROBIC AND ANAEROBIC Blood Culture results may not be optimal due to an inadequate volume of blood received in culture bottles   Culture   Final    NO GROWTH 5 DAYS Performed at [facility], [place] [address], [place], [place] [address]    Report Status 08/14/2019 FINAL  Final    Radiology Reports CT Head Wo Contrast  Result Date: 08/09/2019 CLINICAL DATA:  Frequent falls for the past week. On Eliquis. Right leg weakness. EXAM: CT HEAD WITHOUT CONTRAST TECHNIQUE: Contiguous axial images were obtained from the base of the skull through the vertex without intravenous contrast. COMPARISON:  CT head dated July 09, 2018. FINDINGS: Brain: No evidence of acute infarction, hemorrhage, hydrocephalus, extra-axial collection or mass lesion/mass effect. Stable atrophy and chronic microvascular ischemic changes. Old small lacunar infarct in the right thalamus again noted. Vascular: Calcified atherosclerosis at the skullbase. No hyperdense vessel. Skull: Normal. Negative for fracture or focal lesion. Sinuses/Orbits: No acute finding.  Prior  right mastoidectomy. Other: None. IMPRESSION: 1.  No acute intracranial abnormality. Electronically Signed   By: Titus Dubin M.D.   On: 08/09/2019 09:56   DG Chest Port 1 View  Result Date: 08/09/2019 CLINICAL DATA:  COVID-19 infection progressive cough, weakness and hypoxia. EXAM: PORTABLE CHEST 1 VIEW COMPARISON:  07/28/2019 FINDINGS: Cardiomediastinal contours are stable, dual lead left-sided pacer device in place power pack projecting over left chest. Lung volumes are diminished compared to the previous exam with patchy bilateral basilar opacities. No signs of dense consolidation or evidence of pleural effusion. No acute bone process. IMPRESSION: Low lung volumes with patchy bilateral basilar opacities, new since the previous exam. Sequela of viral/atypical infection is considered. Electronically Signed   By: Zetta Bills M.D.   On: 08/09/2019 09:28   DG Chest Portable 1 View  Result Date: 07/28/2019 CLINICAL DATA:  85 year old male with history of dyspnea. COVID-19 positive. EXAM: PORTABLE CHEST 1 VIEW COMPARISON:  Chest x-ray 10/14/2017. FINDINGS: Patchy areas of interstitial prominence an ill-defined opacities noted throughout the mid to lower lungs bilaterally, concerning for multilobar pneumonia. Lung volumes are low. No pleural effusions. No evidence of pulmonary edema. Mild cardiomegaly. Upper mediastinal contours are within normal limits. Aortic atherosclerosis. Left-sided pacemaker device in place with lead tips projecting over the expected location of the right atrium and right ventricle. IMPRESSION: 1. Subtle changes compatible with multilobar bilateral pneumonia most evident throughout the mid to lower lungs bilaterally. 2. Mild cardiomegaly. 3. Aortic atherosclerosis. Electronically Signed   By: Vinnie Langton M.D.   On: 07/28/2019 13:58

## 2019-08-16 NOTE — NC FL2 (Signed)
[place] LEVEL OF CARE SCREENING TOOL     IDENTIFICATION  Patient Name: Dylan [name] Birthdate: September 25, 1925 Sex: male Admission Date (Current Location): 08/09/2019  [facility] and [place] Number:  Herbalist and Address:  [facility], [place] [address] [place], [place])      Provider Number: [id number]  Attending Physician Name and Address:  Thurnell Lose, MD  Relative Name and Phone Number:       Current Level of Care: Hospital Recommended Level of Care: [place] Prior Approval Number:    Date Approved/Denied:   PASRR Number: [id number]  Discharge Plan: SNF    Current Diagnoses: Patient Active Problem List   Diagnosis Date Noted  . Chronic diastolic CHF (congestive heart failure) ([place]) 07/28/2019  . Pneumonia due to COVID-19 virus 07/28/2019  . Periorbital hematoma of left eye 10/15/2017  . Fall 10/15/2017  . HCAP (healthcare-associated pneumonia) 10/14/2017  . Bradycardia 10/03/2017  . Acute CHF (congestive heart failure) ([place]) 08/03/2017  . Atrial fibrillation ([place]) 07/14/2017  . Diastolic dysfunction with acute on chronic heart failure ([place]) 07/14/2017  . DOE (dyspnea on exertion) 08/24/2014  . Benign essential HTN 08/24/2014  . Type 2 diabetes mellitus without complication ([place]) 99991111  . Dyslipidemia 08/24/2014    Orientation RESPIRATION BLADDER Height & Weight     Self, Time, Situation, Place  Normal Continent Weight: 246 lb 14.6 oz (112 kg) Height:  6\' 1"  (185.4 cm)  BEHAVIORAL SYMPTOMS/MOOD NEUROLOGICAL BOWEL NUTRITION STATUS      Continent Diet(heart healthy)  AMBULATORY STATUS COMMUNICATION OF NEEDS Skin   Limited Assist Verbally Normal                       Personal Care Assistance Level of Assistance  Bathing, Feeding, Dressing Bathing Assistance: Limited assistance Feeding assistance: Independent Dressing Assistance: Limited assistance     Functional Limitations Info  Sight, Hearing Sight Info: Impaired Hearing Info: Impaired      SPECIAL CARE FACTORS FREQUENCY  PT (By licensed PT), OT (By licensed OT)     PT Frequency: 5x/wk OT Frequency: 5x/wk            Contractures Contractures Info: Not present    Additional Factors Info  Code Status, Allergies, Isolation Precautions Code Status Info: DNR Allergies Info: Keflex     Isolation Precautions Info: Airborne/Contact precautions, COVID-19     Current Medications (08/16/2019):  This is the current hospital active medication list Current Facility-Administered Medications  Medication Dose Route Frequency Provider Last Rate Last Admin  . 0.9 %  sodium chloride infusion  250 mL Intravenous PRN Karmen Bongo, MD      . acetaminophen (TYLENOL) tablet 650 mg  650 mg Oral Q6H PRN Karmen Bongo, MD      . albuterol (VENTOLIN HFA) 108 (90 Base) MCG/ACT inhaler 2 puff  2 puff Inhalation Q6H Karmen Bongo, MD   2 puff at 08/16/19 0109  . amiodarone (PACERONE) tablet 200 mg  200 mg Oral Daily Karmen Bongo, MD   200 mg at 08/15/19 1030  . apixaban (ELIQUIS) tablet 5 mg  5 mg Oral BID Karmen Bongo, MD   5 mg at 08/15/19 2106  . bisacodyl (DULCOLAX) EC tablet 5 mg  5 mg Oral Daily PRN Karmen Bongo, MD      . chlorpheniramine-HYDROcodone (TUSSIONEX) 10-8 MG/5ML suspension 5 mL  5 mL Oral Q12H PRN Karmen Bongo, MD   5  mL at 08/12/19 1754  . guaiFENesin-dextromethorphan (ROBITUSSIN DM) 100-10 MG/5ML syrup 10 mL  10 mL Oral Q4H PRN Karmen Bongo, MD   10 mL at 08/09/19 1205  . metoprolol tartrate (LOPRESSOR) tablet 25 mg  25 mg Oral BID Thurnell Lose, MD   25 mg at 08/15/19 1036  . midodrine (PROAMATINE) tablet 2.5 mg  2.5 mg Oral TID WC Thurnell Lose, MD   2.5 mg at 08/15/19 1602  . ondansetron (ZOFRAN) injection 4 mg  4 mg Intravenous Q6H PRN Karmen Bongo, MD      . polyethylene glycol (MIRALAX / GLYCOLAX) packet 17 g  17 g Oral Daily PRN Karmen Bongo, MD      . rosuvastatin (CRESTOR) tablet 10 mg  10 mg Oral Daily Karmen Bongo, MD   10 mg at 08/15/19 1030  . sodium chloride flush (NS) 0.9 % injection 3 mL  3 mL Intravenous Q12H Karmen Bongo, MD   3 mL at 08/15/19 2106  . sodium chloride flush (NS) 0.9 % injection 3 mL  3 mL Intravenous PRN Karmen Bongo, MD      . sodium phosphate (FLEET) 7-19 GM/118ML enema 1 enema  1 enema Rectal Once PRN Karmen Bongo, MD         Discharge Medications: Please see discharge summary for a list of discharge medications.  Relevant Imaging Results:  Relevant Lab Results:   Additional Information SS#: [id number]  Geralynn Ochs, LCSW

## 2019-08-17 DIAGNOSIS — E119 Type 2 diabetes mellitus without complications: Secondary | ICD-10-CM | POA: Diagnosis not present

## 2019-08-17 DIAGNOSIS — U071 COVID-19: Secondary | ICD-10-CM | POA: Diagnosis not present

## 2019-08-17 DIAGNOSIS — I482 Chronic atrial fibrillation, unspecified: Secondary | ICD-10-CM | POA: Diagnosis not present

## 2019-08-17 DIAGNOSIS — E785 Hyperlipidemia, unspecified: Secondary | ICD-10-CM | POA: Diagnosis not present

## 2019-08-17 DIAGNOSIS — J9601 Acute respiratory failure with hypoxia: Secondary | ICD-10-CM | POA: Diagnosis not present

## 2019-08-17 DIAGNOSIS — I951 Orthostatic hypotension: Secondary | ICD-10-CM | POA: Diagnosis not present

## 2019-08-17 DIAGNOSIS — M6281 Muscle weakness (generalized): Secondary | ICD-10-CM | POA: Diagnosis not present

## 2019-08-17 DIAGNOSIS — J1289 Other viral pneumonia: Secondary | ICD-10-CM | POA: Diagnosis not present

## 2019-08-17 DIAGNOSIS — M2550 Pain in unspecified joint: Secondary | ICD-10-CM | POA: Diagnosis not present

## 2019-08-17 DIAGNOSIS — R531 Weakness: Secondary | ICD-10-CM | POA: Diagnosis not present

## 2019-08-17 DIAGNOSIS — I5032 Chronic diastolic (congestive) heart failure: Secondary | ICD-10-CM | POA: Diagnosis not present

## 2019-08-17 DIAGNOSIS — I10 Essential (primary) hypertension: Secondary | ICD-10-CM | POA: Diagnosis not present

## 2019-08-17 DIAGNOSIS — Z7401 Bed confinement status: Secondary | ICD-10-CM | POA: Diagnosis not present

## 2019-08-17 MED ORDER — METOPROLOL TARTRATE 25 MG PO TABS: 12.5000 mg | ORAL_TABLET | Freq: Two times a day (BID) | ORAL | Status: DC

## 2019-08-17 MED ORDER — MIDODRINE HCL 2.5 MG PO TABS: 2.5000 mg | ORAL_TABLET | Freq: Two times a day (BID) | ORAL | Status: DC

## 2019-08-17 NOTE — Plan of Care (Signed)
  Problem: Education: Goal: Knowledge of risk factors and measures for prevention of condition will improve Outcome: Progressing   Problem: Education: Goal: Knowledge of General Education information will improve Description: Including pain rating scale, medication(s)/side effects and non-pharmacologic comfort measures Outcome: Progressing   Problem: Health Behavior/Discharge Planning: Goal: Ability to manage health-related needs will improve Outcome: Progressing   Problem: Clinical Measurements: Goal: Ability to maintain clinical measurements within normal limits will improve Outcome: Progressing Goal: Will remain free from infection Outcome: Progressing Goal: Diagnostic test results will improve Outcome: Progressing Goal: Respiratory complications will improve Outcome: Progressing Goal: Cardiovascular complication will be avoided Outcome: Progressing   Problem: Activity: Goal: Risk for activity intolerance will decrease Outcome: Progressing   Problem: Nutrition: Goal: Adequate nutrition will be maintained Outcome: Progressing   Problem: Coping: Goal: Level of anxiety will decrease Outcome: Progressing   Problem: Elimination: Goal: Will not experience complications related to bowel motility Outcome: Progressing Goal: Will not experience complications related to urinary retention Outcome: Progressing   Problem: Pain Managment: Goal: General experience of comfort will improve Outcome: Progressing   Problem: Safety: Goal: Ability to remain free from injury will improve Outcome: Progressing   Problem: Skin Integrity: Goal: Risk for impaired skin integrity will decrease Outcome: Progressing   

## 2019-08-17 NOTE — TOC Transition Note (Signed)
Transition of Care [facility]) - CM/SW Discharge Note   Patient Details  Name: TYAUN MIRO MRN: [id number] Date of Birth: [birth year]  [facility] of [facility]) CM/SW Contact:  Ninfa Meeker, RN Phone Number: [phone] (working remotely) 08/17/2019, 10:30 AM   Clinical Narrative:   Patient will DC to: [place] date; 08/17/19 Family notified: Son: Lemar Lofty Transport by: Corey Harold 12:30pm  Patient is medically ready for discharge to [facility]. Case manager has Environmental health practitioner, Physiological scientist. Discharge Summary, H&P, FL2 sent Via the [place]. Case manager spoke with Hinton Dyer at [facility] to confirm bed. Bedside RN to call report to [phone], patient will go to room 201-2. Ambulance transport has been requested.   Final next level of care: [place] Barriers to Discharge: No Barriers Identified   Patient Goals and CMS Choice     Choice offered to / list presented to : Adult Children  Discharge Placement                       Discharge Plan and Services     Post Acute Care Choice: Skilled Nursing Facility                               Social Determinants of Health (SDOH) Interventions     Readmission Risk Interventions No flowsheet data found.

## 2019-08-17 NOTE — Progress Notes (Signed)
Patient left the unit with transport. PIV removed and all belongings taken with patient. VSS, RA. No complaints of pain or discomfort

## 2019-08-17 NOTE — Progress Notes (Signed)
Physical Therapy Treatment Patient Details Name: Dylan [name] MRN: [id number] DOB: September 30, 1925 Today's Date: 08/17/2019    History of Present Illness 85 y.o. male with PMH of HTN, HLD, PAF with pacemaker placement on Eliquis, CHF, DM, and a lung nodule who was recently admitted with COVID-19-associated PNA (12/8-13), treated with Decadron, Remdesivir, and Actemra x 1.  He reports that he has had persistent weakness since the time of discharge from [facility]; this is not improving and he has had several falls.  CXR with new infiltrates, readmitted 08/09/19 for treatment of possible PNA although suspected persistent COVID infection.    PT Comments    The patient  Is anxious today to get to rehab. Reoriented several times. Patient's BP is  Slightly low but map is 62. No dizziness when ambulated  A short distance. To go to rehab today.  Follow Up Recommendations  SNF;Supervision for mobility/OOB     Equipment Recommendations  None recommended by PT    Recommendations for Other Services       Precautions / Restrictions Precautions Precautions: Fall Precaution Comments: BP low, not dizzy    Mobility  Bed Mobility               General bed mobility comments: received sitting in recliner  Transfers   Equipment used: Rolling walker (2 wheeled) Transfers: Sit to/from Stand Sit to Stand: Min assist         General transfer comment: steady assist from recliner,  and BSC,  Rw in front, cues to push off  Ambulation/Gait Ambulation/Gait assistance: Min assist Gait Distance (Feet): 10 Feet(x 2) Assistive device: Rolling walker (2 wheeled) Gait Pattern/deviations: Step-through pattern;Staggering right;Staggering left;Trunk flexed Gait velocity: decreased   General Gait Details: gait mildly unsteady,  moved quickly as needed to [employer].   Stairs             Wheelchair Mobility    Modified Rankin (Stroke Patients Only)       Balance   Sitting-balance support:  Feet supported Sitting balance-[name]: Fair     Standing balance support: No upper extremity supported;During functional activity Standing balance-[name]: Poor Standing balance comment: steady support externally while pulling up opants                            Cognition Arousal/Alertness: Awake/alert Behavior During Therapy: Anxious Overall Cognitive Status: No family/caregiver present to determine baseline cognitive functioning Area of Impairment: Attention;Memory;Following commands;Awareness;Problem solving                   Current Attention Level: Selective Memory: Decreased short-term memory Following Commands: Follows one step commands consistently;Follows multi-step commands inconsistently   Awareness: Emergent   General Comments: reoriented several times about going to rehab via ambulance.      Exercises      General Comments        Pertinent Vitals/Pain Pain Assessment: No/denies pain    Home Living                      Prior Function            PT Goals (current goals can now be found in the care plan section) Progress towards PT goals: Progressing toward goals    Frequency    Min 2X/week      PT Plan Current plan remains appropriate    Co-evaluation  AM-PAC PT "6 Clicks" Mobility   Outcome Measure  Help needed turning from your back to your side while in a flat bed without using bedrails?: A Little Help needed moving from lying on your back to sitting on the side of a flat bed without using bedrails?: A Little Help needed moving to and from a bed to a chair (including a wheelchair)?: A Little Help needed standing up from a chair using your arms (e.g., wheelchair or bedside chair)?: A Little Help needed to walk in hospital room?: A Little Help needed climbing 3-5 steps with a railing? : A Lot 6 Click Score: 17    End of Session   Activity Tolerance: Patient limited by  fatigue Patient left: in chair;with call bell/phone within reach;with chair alarm set Nurse Communication: Mobility status PT Visit Diagnosis: Unsteadiness on feet (R26.81);Difficulty in walking, not elsewhere classified (R26.2)     Time: 1150-1210 PT Time Calculation (min) (ACUTE ONLY): 20 min  Charges:  $Gait Training: 8-22 mins                     [place] Pager [phone] Office [phone]    Claretha Cooper 08/17/2019, 1:43 PM

## 2019-08-17 NOTE — Discharge Summary (Signed)
Dylan RATTERMAN GBT:[id number] DOB: 1926/06/04 DOA: 08/09/2019  PCP: Haywood Pao, MD  Admit date: 08/09/2019  Discharge date: 08/17/2019  Admitted From: Home   Disposition:  SNF   Recommendations for Outpatient Follow-up:   Follow up with PCP in 1-2 weeks  PCP Please obtain BMP/CBC, 2 view CXR in 1week,  (see Discharge instructions)   PCP Please follow up on the following pending results:    Home Health: None   Equipment/Devices: None  Consultations: None  Discharge Condition: Stable    CODE STATUS: Full    Diet Recommendation: Heart Healthy Low Carb    Chief Complaint  Patient presents with  . Covid/weakness  . Cough     Brief history of present illness from the day of admission and additional interim summary    Dylan [name] a 85 y.o.malewith medical history significant ofHTN; HLD; PAF with pacemaker placement, on Eliquis; chronic diastolic CHF; DM; and a lung nodule who was recently admitted to [name] with COVID-19-associated PNA, he was treated appropriately and sent home on room air.  At home he fine for the first few days and then he started falling due to weakness.                                                                 Hospital Course   1. Resolved and fully treated Covid 19 Viral Pneumonitis during the ongoing 2020 Covid 19 Pandemic - this [place] was appropriately treated last admission, no hypoxia at this time he is stable on room air and in no respiratory distress.  His only pulmonary problem is cough.  He was readmitted because of multiple falls at home, I had detailed discussions with patient's son who states that patient's PCP sent [name] to the ER and that ER then admitted [name] to the hospital for reasons unknown to [name].  No further treatment for COVID-19 will be done this  admission.  SpO2: 95 % O2 Flow Rate (L/min): 2 L/min  Recent Labs  Lab 08/11/19 0440 08/12/19 0035 08/13/19 0250  CRP <0.5 0.5 0.5  DDIMER 0.59* 0.62* 0.62*  BNP 59.5 63.8 72.1  PROCALCITON <0.10  --   --     Hepatic Function Latest Ref Rng & Units 08/13/2019 08/12/2019 08/11/2019  Total Protein 6.5 - 8.1 g/dL 5.4(L) 5.2(L) 5.3(L)  Albumin 3.5 - 5.0 g/dL 2.9(L) 2.8(L) 2.9(L)  AST 15 - 41 U/L _0 ALT 0 - 44 U/L _1 Alk Phosphatase 38 - 126 U/L 59 55 58  Total Bilirubin 0.3 - 1.2 mg/dL 1.9(H) 1.6(H) 1.8(H)  Bilirubin, Direct 0.0 - 0.2 mg/dL - - -    2.  Generalized weakness and multiple falls.  Severely orthostatic, despite maximal treatment with IV fluids, midodrine and TED stockings he remains  orthostatic but now asymptomatic, stable TSH and random cortisol.  This is most likely chronic age related Autonomic dysfunction, follow fall precautions, wear TED stockings when out of bed, use walker at all times.  Monitor closely at SNF.     3.  Chronic A. fib and rate control.  Mali vas 2 score of greater than 3.  Continue on amiodarone and Eliquis.  Patient has a indwelling pacemaker.  4.  Essential hypertension in supine position with severe orthostatic hypotension upon standing up.  Was on Coreg which has been held, hydrated, TED stockings, midodrine and monitor.  May require walker and fall precautions for the rest of his life if orthostasis does not improve with above measures.  5.  Dyslipidemia.  On Crestor.  6.  Chronic diastolic CHF.  EF 60% on recent echocardiogram done in 2019.  Compensated.   Discharge diagnosis     Principal Problem:   Pneumonia due to COVID-19 virus Active Problems:   Benign essential HTN   Type 2 diabetes mellitus without complication (HCC)   Dyslipidemia   Atrial fibrillation (HCC)   Chronic diastolic CHF (congestive heart failure) [facility])    Discharge instructions    Discharge Instructions    Discharge instructions    Complete by: As directed    Follow with Primary MD Tisovec, Fransico Him, MD in 7 days   Get CBC, CMP, 2 view Chest X ray -  checked next visit within 1 week by Primary MD or SNF MD   Activity: Use walker at all times, has severe orthostatic hypotension, wear TED stockings when out of bed, when coming from laying to standing position first sit down with knees down for 2 to 3 minutes, then gradually stand up with a walker available, if you get dizzy sit down immediately.  Full fall precautions use walker/cane & assistance as needed  Disposition SNF  Diet: Heart Healthy Low Carb  Special Instructions: If you have smoked or chewed Tobacco  in the last 2 yrs please stop smoking, stop any regular Alcohol  and or any Recreational drug use.  On your next visit with your primary care physician please Get Medicines reviewed and adjusted.  Please request your Prim.MD to go over all Hospital Tests and Procedure/Radiological results at the follow up, please get all Hospital records sent to your Prim MD by signing hospital release before you go home.  If you experience worsening of your admission symptoms, develop shortness of breath, life threatening emergency, suicidal or homicidal thoughts you must seek medical attention immediately by calling 911 or calling your MD immediately  if symptoms less severe.  You Must read complete instructions/literature along with all the possible adverse reactions/side effects for all the Medicines you take and that have been prescribed to you. Take any new Medicines after you have completely understood and accpet all the possible adverse reactions/side effects.   Increase activity slowly   Complete by: As directed       Discharge Medications   Allergies as of 08/17/2019      Reactions   Keflex [cephalexin] Hives, Other (See Comments)   Occurred in the 1980's      Medication List    STOP taking these medications   carvedilol 3.125 MG tablet Commonly known as:  COREG     TAKE these medications   acetaminophen 325 MG tablet Commonly known as: TYLENOL Take 650 mg by mouth every 6 (six) hours as needed for mild pain or headache.   amiodarone 200 MG  tablet Commonly known as: PACERONE Take 1 tablet by mouth once daily   apixaban 5 MG Tabs tablet Commonly known as: Eliquis Take 1 tablet (5 mg total) by mouth 2 (two) times daily.   Fish Oil 1200 MG Caps Take 1,200 mg by mouth 2 (two) times daily.   furosemide 20 MG tablet Commonly known as: LASIX Take 1 tablet (20 mg total) by mouth daily.   midodrine 2.5 MG tablet Commonly known as: PROAMATINE Take 1 tablet (2.5 mg total) by mouth 2 (two) times daily with a meal.   rosuvastatin 10 MG tablet Commonly known as: CRESTOR Take 1 tablet (10 mg total) by mouth daily.   vitamin B-12 1000 MCG tablet Commonly known as: CYANOCOBALAMIN Take 1,000 mcg by mouth daily.   Vitamin D 50 MCG (2000 UT) Caps Take 2,000 Units by mouth daily.   VITAMIN E PO Take 1 tablet by mouth daily.        Contact information for follow-up providers    Tisovec, Fransico Him, MD. Schedule an appointment as soon as possible for a visit in 1 week(s).   Specialty: Internal Medicine Contact information: [place] [address] [phone]        Larey Dresser, MD .   Specialty: Cardiology Contact information: [place] [address] [phone]            Contact information for after-discharge care    [facility] .   Service: Skilled Nursing Contact information: [address] [place] [phone]                  Major procedures and Radiology Reports - PLEASE review detailed and final reports thoroughly  -         CT Head Wo Contrast  Result Date: 08/09/2019 CLINICAL DATA:  Frequent falls for the past week. On Eliquis. Right leg weakness. EXAM: CT HEAD WITHOUT CONTRAST TECHNIQUE:  Contiguous axial images were obtained from the base of the skull through the vertex without intravenous contrast. COMPARISON:  CT head dated July 09, 2018. FINDINGS: Brain: No evidence of acute infarction, hemorrhage, hydrocephalus, extra-axial collection or mass lesion/mass effect. Stable atrophy and chronic microvascular ischemic changes. Old small lacunar infarct in the right thalamus again noted. Vascular: Calcified atherosclerosis at the skullbase. No hyperdense vessel. Skull: Normal. Negative for fracture or focal lesion. Sinuses/Orbits: No acute finding.  Prior right mastoidectomy. Other: None. IMPRESSION: 1.  No acute intracranial abnormality. Electronically Signed   By: Titus Dubin M.D.   On: 08/09/2019 09:56   DG Chest Port 1 View  Result Date: 08/09/2019 CLINICAL DATA:  COVID-19 infection progressive cough, weakness and hypoxia. EXAM: PORTABLE CHEST 1 VIEW COMPARISON:  07/28/2019 FINDINGS: Cardiomediastinal contours are stable, dual lead left-sided pacer device in place power pack projecting over left chest. Lung volumes are diminished compared to the previous exam with patchy bilateral basilar opacities. No signs of dense consolidation or evidence of pleural effusion. No acute bone process. IMPRESSION: Low lung volumes with patchy bilateral basilar opacities, new since the previous exam. Sequela of viral/atypical infection is considered. Electronically Signed   By: Zetta Bills M.D.   On: 08/09/2019 09:28   DG Chest Portable 1 View  Result Date: 07/28/2019 CLINICAL DATA:  85 year old male with history of dyspnea. COVID-19 positive. EXAM: PORTABLE CHEST 1 VIEW COMPARISON:  Chest x-ray 10/14/2017. FINDINGS: Patchy areas of interstitial prominence an ill-defined opacities noted throughout  the mid to lower lungs bilaterally, concerning for multilobar pneumonia. Lung volumes are low. No pleural effusions. No evidence of pulmonary edema. Mild cardiomegaly. Upper mediastinal contours are  within normal limits. Aortic atherosclerosis. Left-sided pacemaker device in place with lead tips projecting over the expected location of the right atrium and right ventricle. IMPRESSION: 1. Subtle changes compatible with multilobar bilateral pneumonia most evident throughout the mid to lower lungs bilaterally. 2. Mild cardiomegaly. 3. Aortic atherosclerosis. Electronically Signed   By: Vinnie Langton M.D.   On: 07/28/2019 13:58    Micro Results     Recent Results (from the past 240 hour(s))  Blood Culture (routine x 2)     Status: None   Collection Time: 08/09/19  9:14 AM   Specimen: BLOOD RIGHT ARM  Result Value Ref Range Status   Specimen Description BLOOD RIGHT ARM  Final   Special Requests   Final    BOTTLES DRAWN AEROBIC AND ANAEROBIC Blood Culture results may not be optimal due to an excessive volume of blood received in culture bottles   Culture   Final    NO GROWTH 5 DAYS Performed at [facility], [place] [name], [place], [place] [address]    Report Status 08/14/2019 FINAL  Final  Blood Culture (routine x 2)     Status: None   Collection Time: 08/09/19  9:30 AM   Specimen: BLOOD RIGHT ARM  Result Value Ref Range Status   Specimen Description BLOOD RIGHT ARM  Final   Special Requests   Final    BOTTLES DRAWN AEROBIC AND ANAEROBIC Blood Culture results may not be optimal due to an inadequate volume of blood received in culture bottles   Culture   Final    NO GROWTH 5 DAYS Performed at [facility], [place] [address] [place], [place] [address]    Report Status 08/14/2019 FINAL  Final    Today   Subjective    Amias Hutchinson today has no headache,no chest abdominal pain,no new weakness tingling or numbness, feels much better.   Objective   Blood pressure 110/60, pulse (!) 59, temperature 97.8 F (36.6 C), temperature source Oral, resp. rate 16, height _0  (1.854 m), weight 112 kg, SpO2 95 %.   Intake/Output Summary (Last 24 hours) at 08/17/2019  1011 Last data filed at 08/17/2019 0932 Gross per 24 hour  Intake 940 ml  Output 1250 ml  Net -310 ml    Exam Awake Alert, hard of hearing, No new F.N deficits, Normal affect [place].AT,PERRAL Supple Neck,No JVD, No cervical lymphadenopathy appriciated.  Symmetrical Chest wall movement, Good air movement bilaterally, CTAB RRR,No Gallops,Rubs or new Murmurs, No Parasternal Heave +ve B.Sounds, Abd Soft, Non tender, No organomegaly appriciated, No rebound -guarding or rigidity. No Cyanosis, Clubbing or edema, No new Rash or bruise   Data Review   CBC w Diff:  Lab Results  Component Value Date   WBC 6.5 08/16/2019   HGB 12.9 (L) 08/16/2019   HCT 39.1 08/16/2019   PLT 150 08/16/2019   LYMPHOPCT 34 08/13/2019   MONOPCT 8 08/13/2019   EOSPCT 2 08/13/2019   BASOPCT 0 08/13/2019    CMP:  Lab Results  Component Value Date   NA 137 08/13/2019   NA 139 08/01/2017   K 3.9 08/13/2019   CL 103 08/13/2019   CO2 25 08/13/2019   BUN 21 08/13/2019   BUN 20 08/01/2017   CREATININE 0.88 08/13/2019   PROT 5.4 (L) 08/13/2019  ALBUMIN 2.9 (L) 08/13/2019   BILITOT 1.9 (H) 08/13/2019   ALKPHOS 59 08/13/2019   AST 26 08/13/2019   ALT 31 08/13/2019  .   Total Time in preparing paper work, data evaluation and todays exam - 52 minutes  Lala Lund M.D on 08/17/2019 at 10:11 AM  [facility]   Office  [phone]

## 2019-08-18 ENCOUNTER — Telehealth: Payer: Self-pay | Admitting: Internal Medicine

## 2019-08-18 NOTE — Telephone Encounter (Signed)
Patients son is calling stating we contacted the patients wife today because we are not receiving his pacemaker readings. The patient is currently admitted in [place]. The facility stated they do not have the means to handle it for him so the patients son would like to know what to do in regards to it.

## 2019-08-18 NOTE — Telephone Encounter (Signed)
I spoke with the pt son and he is going to call [employer] tech support to see what is the best monitor for the pt to have at the [facility]. I also gave him my direct office number to call if he has any further questions.

## 2019-08-19 ENCOUNTER — Telehealth: Payer: Self-pay

## 2019-08-19 NOTE — Telephone Encounter (Signed)
The pt son spoke with [employer] and decided to leave his monitor as is. He is going to wait for the pt to get home from rehab to do a transmission. I told him it sounds like a plan. I told him [employer] may send alerts and messages that the pt is not near the monitor.I told him they can ignore those messages.

## 2019-08-22 DIAGNOSIS — U071 COVID-19: Secondary | ICD-10-CM | POA: Diagnosis not present

## 2019-08-22 DIAGNOSIS — I5032 Chronic diastolic (congestive) heart failure: Secondary | ICD-10-CM | POA: Diagnosis not present

## 2019-08-22 DIAGNOSIS — I482 Chronic atrial fibrillation, unspecified: Secondary | ICD-10-CM | POA: Diagnosis not present

## 2019-08-22 DIAGNOSIS — I951 Orthostatic hypotension: Secondary | ICD-10-CM | POA: Diagnosis not present

## 2019-08-24 DIAGNOSIS — I5032 Chronic diastolic (congestive) heart failure: Secondary | ICD-10-CM | POA: Diagnosis not present

## 2019-08-24 DIAGNOSIS — U071 COVID-19: Secondary | ICD-10-CM | POA: Diagnosis not present

## 2019-08-24 DIAGNOSIS — I951 Orthostatic hypotension: Secondary | ICD-10-CM | POA: Diagnosis not present

## 2019-08-24 DIAGNOSIS — I482 Chronic atrial fibrillation, unspecified: Secondary | ICD-10-CM | POA: Diagnosis not present

## 2019-08-27 ENCOUNTER — Ambulatory Visit (HOSPITAL_COMMUNITY)
Admission: RE | Admit: 2019-08-27 | Discharge: 2019-08-27 | Disposition: A | Discharge status: Home / Self Care | Payer: Medicare HMO | Source: Ambulatory Visit | Attending: Cardiology | Admitting: Cardiology

## 2019-08-27 ENCOUNTER — Other Ambulatory Visit: Payer: Self-pay

## 2019-08-27 DIAGNOSIS — I480 Paroxysmal atrial fibrillation: Secondary | ICD-10-CM | POA: Diagnosis not present

## 2019-08-27 DIAGNOSIS — I5032 Chronic diastolic (congestive) heart failure: Secondary | ICD-10-CM

## 2019-08-29 NOTE — Progress Notes (Signed)
Heart Failure TeleHealth Note  Due to national recommendations of social distancing due to [place] 19, Audio/video telehealth visit is felt to be most appropriate for this patient at this time.  See MyChart message from today for patient consent regarding telehealth for [facility].  Date:  08/29/2019   ID:  Dylan [name], DOB 1926/06/15, MRN [id number]  Location: Home  Provider location: [facility] [facility] Type of Visit: Established patient  PCP:  Tisovec, Fransico Him, MD  Cardiologist:  Loralie Champagne, MD  Chief Complaint: Lightheadedness/falls   History of Present Illness: [name] is a 86 y.o. male who presents via audio/video conferencing for a telehealth visit today.     he denies symptoms worrisome for COVID 19.   Patient has a history of DM and HTN as well as atrial fibrillation and CHF.   Patient was doing well until 11/18 when he developed exertional dyspnea.  He went to the ER 07/14/17 and was found to be in atrial fibrillation with volume overload.  He was admitted overnight, diuresed and started on Eliquis, and discharged.  He remained in atrial fibrillation and short of breath.   He was re-hospitalized with dyspnea in 12/18.  TEE-guided DCCV to NSR was done and he was diuresed. TEE showed EF 50%.    He was noted to be back in atrial fibrillation and was started on amiodarone.  TEE-guided DCCV in 1/19 was done, with conversion back to NSR.   He was admitted in 2/19 with syncope and found to have tachy-brady syndrome with junctional bradycardia (symptomatic). [employer] dual chamber PPM was placed.   He was readmitted in 2/19 with pneumonia.  During this admission, he was noted to be back in atrial fibrillation but converted back to NSR.   In 12/20, he developed COVID-19 pneumonitis and was hospitalized at [facility].  He has had several falls since then and has been noted to be orthostatic. No falls in the last week or so.  He is using his  walker at home.  Oxygen saturation at home runs 91-97%.  No chest pain.  No dyspnea walking around the house.  He was markedly orthostatic when his BP was checked today though he was minimally symptomatic (138/75 => 82/40).  He has peripheral edema and has been keeping his legs propped up as much as he can.  He has been started on midodrine for orthostatic hypotension.   Labs (12/20): hgb 12.9, TSH normal, BNP 72, LFTs normal, K 3.9, creatinine 0.88  PMH: 1. Chronic diastolic CHF: Echo ([id number]) with EF 50-55%, mild-moderate LVH, mild AI, mild MR, normal RV size and systolic function, PASP 45 mmHg.  - TEE (12/18): EF 50%, normal RV size and systolic function, mild MR.  - TEE (1/19): EF 55-60%, mild LVH, mild MR, normal RV, aortic sclerosis without significant stenosis.  2. Atrial fibrillation: Persistent, initially diagnosed in 11/18.  - TEE-guided DCCV in 12/18.  - TEE-guided DCCV in 1/19 on amiodarone.  3. Chest pain: LHC remotely without obstructive disease.  1/16 Cardiolite with no ischemia.  4. Type II diabetes 5. HTN 6. CKD: Stage 3.  7. Tachy-brady syndrome: [employer] dual chamber.   8. Orthostatic hypotension.  9. COVID-19 pneumonitis 10. ABIs (6/19): Normal  Social History   Socioeconomic History  . Marital status: Married    Spouse name: Not on file  . Number of children: Not on file  . Years of education: Not on file  . Highest education level: Not on  file  Occupational History  . Not on file  Tobacco Use  . Smoking status: Former Smoker    Packs/day: 1.00    Types: Cigarettes    Quit date: 01/07/1992    Years since quitting: 27.6  . Smokeless tobacco: Never Used  Substance and Sexual Activity  . Alcohol use: Yes    Alcohol/week: 2.0 standard drinks    Types: 2 Glasses of wine per week  . Drug use: No  . Sexual activity: Yes  Other Topics Concern  . Not on file  Social History Narrative  . Not on file   Social Determinants of Health   Financial Resource  Strain:   . Difficulty of Paying Living Expenses: Not on file  Food Insecurity:   . Worried About Charity fundraiser in the Last Year: Not on file  . Ran Out of Food in the Last Year: Not on file  Transportation Needs:   . Lack of Transportation (Medical): Not on file  . Lack of Transportation (Non-Medical): Not on file  Physical Activity:   . Days of Exercise per Week: Not on file  . Minutes of Exercise per Session: Not on file  Stress:   . Feeling of Stress : Not on file  Social Connections:   . Frequency of Communication with Friends and Family: Not on file  . Frequency of Social Gatherings with Friends and Family: Not on file  . Attends Religious Services: Not on file  . Active Member of Clubs or Organizations: Not on file  . Attends Archivist Meetings: Not on file  . Marital Status: Not on file  Intimate Partner Violence:   . Fear of Current or Ex-Partner: Not on file  . Emotionally Abused: Not on file  . Physically Abused: Not on file  . Sexually Abused: Not on file   Family History  Problem Relation Age of Onset  . Cancer - Other Mother   . CVA Father   . Heart attack Brother   . Heart attack Maternal Grandmother    ROS: All systems reviewed and negative except as per HPI.  Current Outpatient Medications  Medication Sig Dispense Refill  . acetaminophen (TYLENOL) 325 MG tablet Take 650 mg by mouth every 6 (six) hours as needed for mild pain or headache.     Marland Kitchen amiodarone (PACERONE) 200 MG tablet Take 1 tablet by mouth once daily (Patient taking differently: Take 200 mg by mouth daily. ) 90 tablet 2  . apixaban (ELIQUIS) 5 MG TABS tablet Take 1 tablet (5 mg total) by mouth 2 (two) times daily. 60 tablet 6  . Cholecalciferol (VITAMIN D) 2000 units CAPS Take 2,000 Units by mouth daily.     . furosemide (LASIX) 20 MG tablet Take 1 tablet (20 mg total) by mouth daily. 30 tablet 3  . midodrine (PROAMATINE) 2.5 MG tablet Take 1 tablet (2.5 mg total) by mouth 2  (two) times daily with a meal.    . Omega-3 Fatty Acids (FISH OIL) 1200 MG CAPS Take 1,200 mg by mouth 2 (two) times daily.     . rosuvastatin (CRESTOR) 10 MG tablet Take 1 tablet (10 mg total) by mouth daily. 90 tablet 3  . vitamin B-12 (CYANOCOBALAMIN) 1000 MCG tablet Take 1,000 mcg by mouth daily.    Marland Kitchen VITAMIN E PO Take 1 tablet by mouth daily.     No current facility-administered medications for this encounter.   Physical (Video/Tele Health Call; Exam is subjective and or/visual.) Oxygen  saturation 97%.  BP 138/75 sitting, 82/40 standing General:  Speaks in full sentences. No resp difficulty. Lungs: Normal respiratory effort with conversation.  Abdomen: Non-distended per patient report Extremities: Ankle edema present.  Neuro: Alert & oriented x 3.    Assessment/Plan: 1. Atrial fibrillation: DCCV in 12/18.  Recurrence of atrial fibrillation with initiation of amiodarone then TEE-guided DCCV (missed an Eliquis dose) in 1/19.  He has not felt palpitations.  - Stop Coreg for now with orthostasis.   - Continue amiodarone 200 mg daily.  Recent TSH and LFTs normal, he will need regular eye exam.  - Continue apixaban.  2. CKD: Recent BMET stable.    3. Chronic diastolic CHF: EF 0000000 from 1/19 TEE.  Weight stable, he has peripheral edema but not short of breath walking around his house.  - Continue Lasix 20 mg daily. 4. Orthostatic hypotension: This has been present since his COVID-19 infection and may be related.  He is still markedly orthostatic though not particularly symptomatic.  - Increase midodrine from 5 mg tid to 10 mg daily.  - Hold Coreg as above.  - Wear compression stockings during the day.   COVID screen The patient does not have any symptoms that suggest any further testing/ screening at this time.  Social distancing reinforced today.  Patient Risk: After full review of this patients clinical status, I feel that they are at moderate risk for cardiac decompensation at this  time.  Relevant cardiac medications were reviewed at length with the patient today. The patient does not have concerns regarding their medications at this time.   Recommended follow-up:  6 wks  Today, I have spent 18 minutes with the patient with telehealth technology discussing the above issues .    Signed, Loralie Champagne, MD  08/29/2019  [place] [address] [facility] [place] [address] [phone] (office) [phone] (fax)

## 2019-08-30 DIAGNOSIS — U071 COVID-19: Secondary | ICD-10-CM | POA: Diagnosis not present

## 2019-08-30 DIAGNOSIS — I5032 Chronic diastolic (congestive) heart failure: Secondary | ICD-10-CM | POA: Diagnosis not present

## 2019-08-30 DIAGNOSIS — I951 Orthostatic hypotension: Secondary | ICD-10-CM | POA: Diagnosis not present

## 2019-08-30 DIAGNOSIS — E119 Type 2 diabetes mellitus without complications: Secondary | ICD-10-CM | POA: Diagnosis not present

## 2019-08-30 DIAGNOSIS — E785 Hyperlipidemia, unspecified: Secondary | ICD-10-CM | POA: Diagnosis not present

## 2019-08-30 DIAGNOSIS — I480 Paroxysmal atrial fibrillation: Secondary | ICD-10-CM | POA: Diagnosis not present

## 2019-08-30 DIAGNOSIS — Z87891 Personal history of nicotine dependence: Secondary | ICD-10-CM | POA: Diagnosis not present

## 2019-08-30 DIAGNOSIS — I110 Hypertensive heart disease with heart failure: Secondary | ICD-10-CM | POA: Diagnosis not present

## 2019-08-30 DIAGNOSIS — Z96651 Presence of right artificial knee joint: Secondary | ICD-10-CM | POA: Diagnosis not present

## 2019-08-30 DIAGNOSIS — Z7901 Long term (current) use of anticoagulants: Secondary | ICD-10-CM | POA: Diagnosis not present

## 2019-08-31 DIAGNOSIS — I959 Hypotension, unspecified: Secondary | ICD-10-CM | POA: Diagnosis not present

## 2019-08-31 DIAGNOSIS — Z950 Presence of cardiac pacemaker: Secondary | ICD-10-CM | POA: Diagnosis not present

## 2019-08-31 DIAGNOSIS — I480 Paroxysmal atrial fibrillation: Secondary | ICD-10-CM | POA: Diagnosis not present

## 2019-08-31 DIAGNOSIS — I5032 Chronic diastolic (congestive) heart failure: Secondary | ICD-10-CM | POA: Diagnosis not present

## 2019-08-31 DIAGNOSIS — M6281 Muscle weakness (generalized): Secondary | ICD-10-CM | POA: Diagnosis not present

## 2019-08-31 DIAGNOSIS — Z9181 History of falling: Secondary | ICD-10-CM | POA: Diagnosis not present

## 2019-08-31 DIAGNOSIS — U071 COVID-19: Secondary | ICD-10-CM | POA: Diagnosis not present

## 2019-08-31 DIAGNOSIS — Z7901 Long term (current) use of anticoagulants: Secondary | ICD-10-CM | POA: Diagnosis not present

## 2019-09-01 DIAGNOSIS — H903 Sensorineural hearing loss, bilateral: Secondary | ICD-10-CM | POA: Diagnosis not present

## 2019-09-02 DIAGNOSIS — I110 Hypertensive heart disease with heart failure: Secondary | ICD-10-CM | POA: Diagnosis not present

## 2019-09-02 DIAGNOSIS — I5032 Chronic diastolic (congestive) heart failure: Secondary | ICD-10-CM | POA: Diagnosis not present

## 2019-09-02 DIAGNOSIS — Z87891 Personal history of nicotine dependence: Secondary | ICD-10-CM | POA: Diagnosis not present

## 2019-09-02 DIAGNOSIS — I480 Paroxysmal atrial fibrillation: Secondary | ICD-10-CM | POA: Diagnosis not present

## 2019-09-02 DIAGNOSIS — Z7901 Long term (current) use of anticoagulants: Secondary | ICD-10-CM | POA: Diagnosis not present

## 2019-09-02 DIAGNOSIS — I951 Orthostatic hypotension: Secondary | ICD-10-CM | POA: Diagnosis not present

## 2019-09-02 DIAGNOSIS — E119 Type 2 diabetes mellitus without complications: Secondary | ICD-10-CM | POA: Diagnosis not present

## 2019-09-02 DIAGNOSIS — E785 Hyperlipidemia, unspecified: Secondary | ICD-10-CM | POA: Diagnosis not present

## 2019-09-02 DIAGNOSIS — U071 COVID-19: Secondary | ICD-10-CM | POA: Diagnosis not present

## 2019-09-02 DIAGNOSIS — Z96651 Presence of right artificial knee joint: Secondary | ICD-10-CM | POA: Diagnosis not present

## 2019-09-03 DIAGNOSIS — I951 Orthostatic hypotension: Secondary | ICD-10-CM | POA: Diagnosis not present

## 2019-09-03 DIAGNOSIS — Z7901 Long term (current) use of anticoagulants: Secondary | ICD-10-CM | POA: Diagnosis not present

## 2019-09-03 DIAGNOSIS — I5032 Chronic diastolic (congestive) heart failure: Secondary | ICD-10-CM | POA: Diagnosis not present

## 2019-09-03 DIAGNOSIS — E785 Hyperlipidemia, unspecified: Secondary | ICD-10-CM | POA: Diagnosis not present

## 2019-09-03 DIAGNOSIS — E119 Type 2 diabetes mellitus without complications: Secondary | ICD-10-CM | POA: Diagnosis not present

## 2019-09-03 DIAGNOSIS — I110 Hypertensive heart disease with heart failure: Secondary | ICD-10-CM | POA: Diagnosis not present

## 2019-09-03 DIAGNOSIS — U071 COVID-19: Secondary | ICD-10-CM | POA: Diagnosis not present

## 2019-09-03 DIAGNOSIS — Z87891 Personal history of nicotine dependence: Secondary | ICD-10-CM | POA: Diagnosis not present

## 2019-09-03 DIAGNOSIS — I480 Paroxysmal atrial fibrillation: Secondary | ICD-10-CM | POA: Diagnosis not present

## 2019-09-03 DIAGNOSIS — Z96651 Presence of right artificial knee joint: Secondary | ICD-10-CM | POA: Diagnosis not present

## 2019-09-04 DIAGNOSIS — I110 Hypertensive heart disease with heart failure: Secondary | ICD-10-CM | POA: Diagnosis not present

## 2019-09-04 DIAGNOSIS — I951 Orthostatic hypotension: Secondary | ICD-10-CM | POA: Diagnosis not present

## 2019-09-04 DIAGNOSIS — Z87891 Personal history of nicotine dependence: Secondary | ICD-10-CM | POA: Diagnosis not present

## 2019-09-04 DIAGNOSIS — Z7901 Long term (current) use of anticoagulants: Secondary | ICD-10-CM | POA: Diagnosis not present

## 2019-09-04 DIAGNOSIS — Z96651 Presence of right artificial knee joint: Secondary | ICD-10-CM | POA: Diagnosis not present

## 2019-09-04 DIAGNOSIS — I5032 Chronic diastolic (congestive) heart failure: Secondary | ICD-10-CM | POA: Diagnosis not present

## 2019-09-04 DIAGNOSIS — E119 Type 2 diabetes mellitus without complications: Secondary | ICD-10-CM | POA: Diagnosis not present

## 2019-09-04 DIAGNOSIS — U071 COVID-19: Secondary | ICD-10-CM | POA: Diagnosis not present

## 2019-09-04 DIAGNOSIS — E785 Hyperlipidemia, unspecified: Secondary | ICD-10-CM | POA: Diagnosis not present

## 2019-09-04 DIAGNOSIS — I480 Paroxysmal atrial fibrillation: Secondary | ICD-10-CM | POA: Diagnosis not present

## 2019-09-07 ENCOUNTER — Telehealth (HOSPITAL_COMMUNITY): Payer: Self-pay | Admitting: Cardiology

## 2019-09-07 DIAGNOSIS — E785 Hyperlipidemia, unspecified: Secondary | ICD-10-CM | POA: Diagnosis not present

## 2019-09-07 DIAGNOSIS — Z7901 Long term (current) use of anticoagulants: Secondary | ICD-10-CM | POA: Diagnosis not present

## 2019-09-07 DIAGNOSIS — I110 Hypertensive heart disease with heart failure: Secondary | ICD-10-CM | POA: Diagnosis not present

## 2019-09-07 DIAGNOSIS — I480 Paroxysmal atrial fibrillation: Secondary | ICD-10-CM | POA: Diagnosis not present

## 2019-09-07 DIAGNOSIS — E119 Type 2 diabetes mellitus without complications: Secondary | ICD-10-CM | POA: Diagnosis not present

## 2019-09-07 DIAGNOSIS — Z87891 Personal history of nicotine dependence: Secondary | ICD-10-CM | POA: Diagnosis not present

## 2019-09-07 DIAGNOSIS — I951 Orthostatic hypotension: Secondary | ICD-10-CM | POA: Diagnosis not present

## 2019-09-07 DIAGNOSIS — U071 COVID-19: Secondary | ICD-10-CM | POA: Diagnosis not present

## 2019-09-07 DIAGNOSIS — Z96651 Presence of right artificial knee joint: Secondary | ICD-10-CM | POA: Diagnosis not present

## 2019-09-07 DIAGNOSIS — I5032 Chronic diastolic (congestive) heart failure: Secondary | ICD-10-CM | POA: Diagnosis not present

## 2019-09-07 MED ORDER — MIDODRINE HCL 10 MG PO TABS: 10.0000 mg | ORAL_TABLET | Freq: Three times a day (TID) | ORAL | 3 refills | Status: DC

## 2019-09-07 NOTE — Telephone Encounter (Signed)
Patients son called to report he needs a updated rx sent to pharmacy Midodrine updated at last OV and quantity update not sent to pharmacy.  Reviewed OV with Dr Aundra Dubin Midodrine 10 mg TID is what patient should be taking   Script sent

## 2019-09-08 DIAGNOSIS — I5032 Chronic diastolic (congestive) heart failure: Secondary | ICD-10-CM | POA: Diagnosis not present

## 2019-09-08 DIAGNOSIS — Z96651 Presence of right artificial knee joint: Secondary | ICD-10-CM | POA: Diagnosis not present

## 2019-09-08 DIAGNOSIS — I951 Orthostatic hypotension: Secondary | ICD-10-CM | POA: Diagnosis not present

## 2019-09-08 DIAGNOSIS — I110 Hypertensive heart disease with heart failure: Secondary | ICD-10-CM | POA: Diagnosis not present

## 2019-09-08 DIAGNOSIS — U071 COVID-19: Secondary | ICD-10-CM | POA: Diagnosis not present

## 2019-09-08 DIAGNOSIS — E119 Type 2 diabetes mellitus without complications: Secondary | ICD-10-CM | POA: Diagnosis not present

## 2019-09-08 DIAGNOSIS — Z7901 Long term (current) use of anticoagulants: Secondary | ICD-10-CM | POA: Diagnosis not present

## 2019-09-08 DIAGNOSIS — I480 Paroxysmal atrial fibrillation: Secondary | ICD-10-CM | POA: Diagnosis not present

## 2019-09-08 DIAGNOSIS — E785 Hyperlipidemia, unspecified: Secondary | ICD-10-CM | POA: Diagnosis not present

## 2019-09-08 DIAGNOSIS — Z87891 Personal history of nicotine dependence: Secondary | ICD-10-CM | POA: Diagnosis not present

## 2019-09-09 DIAGNOSIS — I110 Hypertensive heart disease with heart failure: Secondary | ICD-10-CM | POA: Diagnosis not present

## 2019-09-09 DIAGNOSIS — U071 COVID-19: Secondary | ICD-10-CM | POA: Diagnosis not present

## 2019-09-09 DIAGNOSIS — I480 Paroxysmal atrial fibrillation: Secondary | ICD-10-CM | POA: Diagnosis not present

## 2019-09-09 DIAGNOSIS — E785 Hyperlipidemia, unspecified: Secondary | ICD-10-CM | POA: Diagnosis not present

## 2019-09-09 DIAGNOSIS — I5032 Chronic diastolic (congestive) heart failure: Secondary | ICD-10-CM | POA: Diagnosis not present

## 2019-09-09 DIAGNOSIS — Z87891 Personal history of nicotine dependence: Secondary | ICD-10-CM | POA: Diagnosis not present

## 2019-09-09 DIAGNOSIS — I951 Orthostatic hypotension: Secondary | ICD-10-CM | POA: Diagnosis not present

## 2019-09-09 DIAGNOSIS — Z96651 Presence of right artificial knee joint: Secondary | ICD-10-CM | POA: Diagnosis not present

## 2019-09-09 DIAGNOSIS — E119 Type 2 diabetes mellitus without complications: Secondary | ICD-10-CM | POA: Diagnosis not present

## 2019-09-09 DIAGNOSIS — Z7901 Long term (current) use of anticoagulants: Secondary | ICD-10-CM | POA: Diagnosis not present

## 2019-09-10 DIAGNOSIS — Z96651 Presence of right artificial knee joint: Secondary | ICD-10-CM | POA: Diagnosis not present

## 2019-09-10 DIAGNOSIS — I5032 Chronic diastolic (congestive) heart failure: Secondary | ICD-10-CM | POA: Diagnosis not present

## 2019-09-10 DIAGNOSIS — I110 Hypertensive heart disease with heart failure: Secondary | ICD-10-CM | POA: Diagnosis not present

## 2019-09-10 DIAGNOSIS — E119 Type 2 diabetes mellitus without complications: Secondary | ICD-10-CM | POA: Diagnosis not present

## 2019-09-10 DIAGNOSIS — Z7901 Long term (current) use of anticoagulants: Secondary | ICD-10-CM | POA: Diagnosis not present

## 2019-09-10 DIAGNOSIS — E785 Hyperlipidemia, unspecified: Secondary | ICD-10-CM | POA: Diagnosis not present

## 2019-09-10 DIAGNOSIS — I951 Orthostatic hypotension: Secondary | ICD-10-CM | POA: Diagnosis not present

## 2019-09-10 DIAGNOSIS — I480 Paroxysmal atrial fibrillation: Secondary | ICD-10-CM | POA: Diagnosis not present

## 2019-09-10 DIAGNOSIS — U071 COVID-19: Secondary | ICD-10-CM | POA: Diagnosis not present

## 2019-09-10 DIAGNOSIS — Z87891 Personal history of nicotine dependence: Secondary | ICD-10-CM | POA: Diagnosis not present

## 2019-09-11 DIAGNOSIS — U071 COVID-19: Secondary | ICD-10-CM | POA: Diagnosis not present

## 2019-09-11 DIAGNOSIS — Z87891 Personal history of nicotine dependence: Secondary | ICD-10-CM | POA: Diagnosis not present

## 2019-09-11 DIAGNOSIS — I951 Orthostatic hypotension: Secondary | ICD-10-CM | POA: Diagnosis not present

## 2019-09-11 DIAGNOSIS — E119 Type 2 diabetes mellitus without complications: Secondary | ICD-10-CM | POA: Diagnosis not present

## 2019-09-11 DIAGNOSIS — Z96651 Presence of right artificial knee joint: Secondary | ICD-10-CM | POA: Diagnosis not present

## 2019-09-11 DIAGNOSIS — Z7901 Long term (current) use of anticoagulants: Secondary | ICD-10-CM | POA: Diagnosis not present

## 2019-09-11 DIAGNOSIS — I110 Hypertensive heart disease with heart failure: Secondary | ICD-10-CM | POA: Diagnosis not present

## 2019-09-11 DIAGNOSIS — E785 Hyperlipidemia, unspecified: Secondary | ICD-10-CM | POA: Diagnosis not present

## 2019-09-11 DIAGNOSIS — I480 Paroxysmal atrial fibrillation: Secondary | ICD-10-CM | POA: Diagnosis not present

## 2019-09-11 DIAGNOSIS — I5032 Chronic diastolic (congestive) heart failure: Secondary | ICD-10-CM | POA: Diagnosis not present

## 2019-09-14 DIAGNOSIS — Z87891 Personal history of nicotine dependence: Secondary | ICD-10-CM | POA: Diagnosis not present

## 2019-09-14 DIAGNOSIS — E785 Hyperlipidemia, unspecified: Secondary | ICD-10-CM | POA: Diagnosis not present

## 2019-09-14 DIAGNOSIS — I951 Orthostatic hypotension: Secondary | ICD-10-CM | POA: Diagnosis not present

## 2019-09-14 DIAGNOSIS — E119 Type 2 diabetes mellitus without complications: Secondary | ICD-10-CM | POA: Diagnosis not present

## 2019-09-14 DIAGNOSIS — U071 COVID-19: Secondary | ICD-10-CM | POA: Diagnosis not present

## 2019-09-14 DIAGNOSIS — I480 Paroxysmal atrial fibrillation: Secondary | ICD-10-CM | POA: Diagnosis not present

## 2019-09-14 DIAGNOSIS — Z96651 Presence of right artificial knee joint: Secondary | ICD-10-CM | POA: Diagnosis not present

## 2019-09-14 DIAGNOSIS — I5032 Chronic diastolic (congestive) heart failure: Secondary | ICD-10-CM | POA: Diagnosis not present

## 2019-09-14 DIAGNOSIS — I110 Hypertensive heart disease with heart failure: Secondary | ICD-10-CM | POA: Diagnosis not present

## 2019-09-14 DIAGNOSIS — Z7901 Long term (current) use of anticoagulants: Secondary | ICD-10-CM | POA: Diagnosis not present

## 2019-09-15 DIAGNOSIS — Z96651 Presence of right artificial knee joint: Secondary | ICD-10-CM | POA: Diagnosis not present

## 2019-09-15 DIAGNOSIS — I480 Paroxysmal atrial fibrillation: Secondary | ICD-10-CM | POA: Diagnosis not present

## 2019-09-15 DIAGNOSIS — Z87891 Personal history of nicotine dependence: Secondary | ICD-10-CM | POA: Diagnosis not present

## 2019-09-15 DIAGNOSIS — I951 Orthostatic hypotension: Secondary | ICD-10-CM | POA: Diagnosis not present

## 2019-09-15 DIAGNOSIS — Z7901 Long term (current) use of anticoagulants: Secondary | ICD-10-CM | POA: Diagnosis not present

## 2019-09-15 DIAGNOSIS — E785 Hyperlipidemia, unspecified: Secondary | ICD-10-CM | POA: Diagnosis not present

## 2019-09-15 DIAGNOSIS — I110 Hypertensive heart disease with heart failure: Secondary | ICD-10-CM | POA: Diagnosis not present

## 2019-09-15 DIAGNOSIS — U071 COVID-19: Secondary | ICD-10-CM | POA: Diagnosis not present

## 2019-09-15 DIAGNOSIS — E119 Type 2 diabetes mellitus without complications: Secondary | ICD-10-CM | POA: Diagnosis not present

## 2019-09-15 DIAGNOSIS — I5032 Chronic diastolic (congestive) heart failure: Secondary | ICD-10-CM | POA: Diagnosis not present

## 2019-09-16 DIAGNOSIS — Z96651 Presence of right artificial knee joint: Secondary | ICD-10-CM | POA: Diagnosis not present

## 2019-09-16 DIAGNOSIS — I480 Paroxysmal atrial fibrillation: Secondary | ICD-10-CM | POA: Diagnosis not present

## 2019-09-16 DIAGNOSIS — U071 COVID-19: Secondary | ICD-10-CM | POA: Diagnosis not present

## 2019-09-16 DIAGNOSIS — Z87891 Personal history of nicotine dependence: Secondary | ICD-10-CM | POA: Diagnosis not present

## 2019-09-16 DIAGNOSIS — I110 Hypertensive heart disease with heart failure: Secondary | ICD-10-CM | POA: Diagnosis not present

## 2019-09-16 DIAGNOSIS — I5032 Chronic diastolic (congestive) heart failure: Secondary | ICD-10-CM | POA: Diagnosis not present

## 2019-09-16 DIAGNOSIS — E119 Type 2 diabetes mellitus without complications: Secondary | ICD-10-CM | POA: Diagnosis not present

## 2019-09-16 DIAGNOSIS — I951 Orthostatic hypotension: Secondary | ICD-10-CM | POA: Diagnosis not present

## 2019-09-16 DIAGNOSIS — E785 Hyperlipidemia, unspecified: Secondary | ICD-10-CM | POA: Diagnosis not present

## 2019-09-16 DIAGNOSIS — Z7901 Long term (current) use of anticoagulants: Secondary | ICD-10-CM | POA: Diagnosis not present

## 2019-09-17 DIAGNOSIS — Z7901 Long term (current) use of anticoagulants: Secondary | ICD-10-CM | POA: Diagnosis not present

## 2019-09-17 DIAGNOSIS — E119 Type 2 diabetes mellitus without complications: Secondary | ICD-10-CM | POA: Diagnosis not present

## 2019-09-17 DIAGNOSIS — E785 Hyperlipidemia, unspecified: Secondary | ICD-10-CM | POA: Diagnosis not present

## 2019-09-17 DIAGNOSIS — I480 Paroxysmal atrial fibrillation: Secondary | ICD-10-CM | POA: Diagnosis not present

## 2019-09-17 DIAGNOSIS — I110 Hypertensive heart disease with heart failure: Secondary | ICD-10-CM | POA: Diagnosis not present

## 2019-09-17 DIAGNOSIS — I951 Orthostatic hypotension: Secondary | ICD-10-CM | POA: Diagnosis not present

## 2019-09-17 DIAGNOSIS — Z87891 Personal history of nicotine dependence: Secondary | ICD-10-CM | POA: Diagnosis not present

## 2019-09-17 DIAGNOSIS — I5032 Chronic diastolic (congestive) heart failure: Secondary | ICD-10-CM | POA: Diagnosis not present

## 2019-09-17 DIAGNOSIS — Z96651 Presence of right artificial knee joint: Secondary | ICD-10-CM | POA: Diagnosis not present

## 2019-09-17 DIAGNOSIS — U071 COVID-19: Secondary | ICD-10-CM | POA: Diagnosis not present

## 2019-09-21 DIAGNOSIS — U071 COVID-19: Secondary | ICD-10-CM | POA: Diagnosis not present

## 2019-09-21 DIAGNOSIS — Z96651 Presence of right artificial knee joint: Secondary | ICD-10-CM | POA: Diagnosis not present

## 2019-09-21 DIAGNOSIS — Z87891 Personal history of nicotine dependence: Secondary | ICD-10-CM | POA: Diagnosis not present

## 2019-09-21 DIAGNOSIS — Z7901 Long term (current) use of anticoagulants: Secondary | ICD-10-CM | POA: Diagnosis not present

## 2019-09-21 DIAGNOSIS — I480 Paroxysmal atrial fibrillation: Secondary | ICD-10-CM | POA: Diagnosis not present

## 2019-09-21 DIAGNOSIS — E785 Hyperlipidemia, unspecified: Secondary | ICD-10-CM | POA: Diagnosis not present

## 2019-09-21 DIAGNOSIS — I110 Hypertensive heart disease with heart failure: Secondary | ICD-10-CM | POA: Diagnosis not present

## 2019-09-21 DIAGNOSIS — E119 Type 2 diabetes mellitus without complications: Secondary | ICD-10-CM | POA: Diagnosis not present

## 2019-09-21 DIAGNOSIS — I951 Orthostatic hypotension: Secondary | ICD-10-CM | POA: Diagnosis not present

## 2019-09-21 DIAGNOSIS — I5032 Chronic diastolic (congestive) heart failure: Secondary | ICD-10-CM | POA: Diagnosis not present

## 2019-09-23 DIAGNOSIS — I5032 Chronic diastolic (congestive) heart failure: Secondary | ICD-10-CM | POA: Diagnosis not present

## 2019-09-23 DIAGNOSIS — E119 Type 2 diabetes mellitus without complications: Secondary | ICD-10-CM | POA: Diagnosis not present

## 2019-09-23 DIAGNOSIS — I951 Orthostatic hypotension: Secondary | ICD-10-CM | POA: Diagnosis not present

## 2019-09-23 DIAGNOSIS — U071 COVID-19: Secondary | ICD-10-CM | POA: Diagnosis not present

## 2019-09-23 DIAGNOSIS — Z96651 Presence of right artificial knee joint: Secondary | ICD-10-CM | POA: Diagnosis not present

## 2019-09-23 DIAGNOSIS — I480 Paroxysmal atrial fibrillation: Secondary | ICD-10-CM | POA: Diagnosis not present

## 2019-09-23 DIAGNOSIS — Z7901 Long term (current) use of anticoagulants: Secondary | ICD-10-CM | POA: Diagnosis not present

## 2019-09-23 DIAGNOSIS — Z87891 Personal history of nicotine dependence: Secondary | ICD-10-CM | POA: Diagnosis not present

## 2019-09-23 DIAGNOSIS — E785 Hyperlipidemia, unspecified: Secondary | ICD-10-CM | POA: Diagnosis not present

## 2019-09-23 DIAGNOSIS — I110 Hypertensive heart disease with heart failure: Secondary | ICD-10-CM | POA: Diagnosis not present

## 2019-09-24 DIAGNOSIS — I480 Paroxysmal atrial fibrillation: Secondary | ICD-10-CM | POA: Diagnosis not present

## 2019-09-24 DIAGNOSIS — E119 Type 2 diabetes mellitus without complications: Secondary | ICD-10-CM | POA: Diagnosis not present

## 2019-09-24 DIAGNOSIS — Z96651 Presence of right artificial knee joint: Secondary | ICD-10-CM | POA: Diagnosis not present

## 2019-09-24 DIAGNOSIS — I110 Hypertensive heart disease with heart failure: Secondary | ICD-10-CM | POA: Diagnosis not present

## 2019-09-24 DIAGNOSIS — Z7901 Long term (current) use of anticoagulants: Secondary | ICD-10-CM | POA: Diagnosis not present

## 2019-09-24 DIAGNOSIS — I951 Orthostatic hypotension: Secondary | ICD-10-CM | POA: Diagnosis not present

## 2019-09-24 DIAGNOSIS — E785 Hyperlipidemia, unspecified: Secondary | ICD-10-CM | POA: Diagnosis not present

## 2019-09-24 DIAGNOSIS — I5032 Chronic diastolic (congestive) heart failure: Secondary | ICD-10-CM | POA: Diagnosis not present

## 2019-09-24 DIAGNOSIS — Z87891 Personal history of nicotine dependence: Secondary | ICD-10-CM | POA: Diagnosis not present

## 2019-09-24 DIAGNOSIS — U071 COVID-19: Secondary | ICD-10-CM | POA: Diagnosis not present

## 2019-09-28 ENCOUNTER — Telehealth (HOSPITAL_COMMUNITY): Payer: Self-pay

## 2019-09-28 NOTE — Telephone Encounter (Signed)

## 2019-09-29 ENCOUNTER — Encounter (HOSPITAL_COMMUNITY): Payer: Self-pay | Admitting: Cardiology

## 2019-09-29 ENCOUNTER — Other Ambulatory Visit: Payer: Self-pay

## 2019-09-29 ENCOUNTER — Ambulatory Visit (HOSPITAL_COMMUNITY)
Admission: RE | Admit: 2019-09-29 | Discharge: 2019-09-29 | Disposition: A | Discharge status: Home / Self Care | Payer: Medicare HMO | Source: Ambulatory Visit | Attending: Cardiology | Admitting: Cardiology

## 2019-09-29 VITALS — BP 114/70 | HR 79 | Wt 253.8 lb

## 2019-09-29 DIAGNOSIS — I5032 Chronic diastolic (congestive) heart failure: Secondary | ICD-10-CM | POA: Insufficient documentation

## 2019-09-29 DIAGNOSIS — N183 Chronic kidney disease, stage 3 unspecified: Secondary | ICD-10-CM | POA: Diagnosis not present

## 2019-09-29 DIAGNOSIS — E1122 Type 2 diabetes mellitus with diabetic chronic kidney disease: Secondary | ICD-10-CM | POA: Insufficient documentation

## 2019-09-29 DIAGNOSIS — Z79899 Other long term (current) drug therapy: Secondary | ICD-10-CM | POA: Diagnosis not present

## 2019-09-29 DIAGNOSIS — I4891 Unspecified atrial fibrillation: Secondary | ICD-10-CM | POA: Diagnosis not present

## 2019-09-29 DIAGNOSIS — Z87891 Personal history of nicotine dependence: Secondary | ICD-10-CM | POA: Diagnosis not present

## 2019-09-29 DIAGNOSIS — I495 Sick sinus syndrome: Secondary | ICD-10-CM | POA: Diagnosis not present

## 2019-09-29 DIAGNOSIS — I480 Paroxysmal atrial fibrillation: Secondary | ICD-10-CM

## 2019-09-29 DIAGNOSIS — Z8616 Personal history of COVID-19: Secondary | ICD-10-CM | POA: Insufficient documentation

## 2019-09-29 DIAGNOSIS — I951 Orthostatic hypotension: Secondary | ICD-10-CM | POA: Insufficient documentation

## 2019-09-29 DIAGNOSIS — Z8249 Family history of ischemic heart disease and other diseases of the circulatory system: Secondary | ICD-10-CM | POA: Diagnosis not present

## 2019-09-29 DIAGNOSIS — Z7901 Long term (current) use of anticoagulants: Secondary | ICD-10-CM | POA: Diagnosis not present

## 2019-09-29 DIAGNOSIS — I130 Hypertensive heart and chronic kidney disease with heart failure and stage 1 through stage 4 chronic kidney disease, or unspecified chronic kidney disease: Secondary | ICD-10-CM | POA: Diagnosis not present

## 2019-09-29 DIAGNOSIS — Z950 Presence of cardiac pacemaker: Secondary | ICD-10-CM | POA: Insufficient documentation

## 2019-09-29 DIAGNOSIS — E785 Hyperlipidemia, unspecified: Secondary | ICD-10-CM | POA: Insufficient documentation

## 2019-09-29 LAB — COMPREHENSIVE METABOLIC PANEL
ALT: 17 U/L (ref 0–44)
AST: 20 U/L (ref 15–41)
Albumin: 3.4 g/dL — ABNORMAL LOW (ref 3.5–5.0)
Alkaline Phosphatase: 59 U/L (ref 38–126)
Anion gap: 10 (ref 5–15)
BUN: 12 mg/dL (ref 8–23)
CO2: 22 mmol/L (ref 22–32)
Calcium: 8.6 mg/dL — ABNORMAL LOW (ref 8.9–10.3)
Chloride: 105 mmol/L (ref 98–111)
Creatinine, Ser: 1.03 mg/dL (ref 0.61–1.24)
GFR calc Af Amer: >60 mL/min (ref >60)
GFR calc non Af Amer: >60 mL/min (ref >60)
Glucose, Bld: 103 mg/dL — ABNORMAL HIGH (ref 70–99)
Potassium: 3.8 mmol/L (ref 3.5–5.1)
Sodium: 137 mmol/L (ref 135–145)
Total Bilirubin: 0.9 mg/dL (ref 0.3–1.2)
Total Protein: 5.8 g/dL — ABNORMAL LOW (ref 6.5–8.1)

## 2019-09-29 LAB — CBC
HCT: 40.1 % (ref 39.0–52.0)
Hemoglobin: 13 g/dL (ref 13.0–17.0)
MCH: 32.5 pg (ref 26.0–34.0)
MCHC: 32.4 g/dL (ref 30.0–36.0)
MCV: 100.3 fL — ABNORMAL HIGH (ref 80.0–100.0)
Platelets: 165 10*3/uL (ref 150–400)
RBC: 4 MIL/uL — ABNORMAL LOW (ref 4.22–5.81)
RDW: 13.8 % (ref 11.5–15.5)
WBC: 6.5 10*3/uL (ref 4.0–10.5)
nRBC: 0 % (ref 0.0–0.2)

## 2019-09-29 LAB — LIPID PANEL
Cholesterol: 110 mg/dL (ref 0–200)
HDL: 39 mg/dL — ABNORMAL LOW (ref >40)
LDL Cholesterol: 52 mg/dL (ref 0–99)
Total CHOL/HDL Ratio: 2.8 RATIO
Triglycerides: 96 mg/dL (ref <150)
VLDL: 19 mg/dL (ref 0–40)

## 2019-09-29 LAB — TSH: TSH: 2.966 u[IU]/mL (ref 0.350–4.500)

## 2019-09-29 NOTE — Patient Instructions (Signed)
Lab work done today. We will notify you of any abnormal lab work. No news is good news!  Please follow up with the [facility] in 4 months.  At the [facility], you and your health needs are our priority. As part of our continuing mission to provide you with exceptional heart care, we have created designated Provider Care Teams. These Care Teams include your primary Cardiologist (physician) and Advanced Practice Providers (APPs- Physician Assistants and Nurse Practitioners) who all work together to provide you with the care you need, when you need it.   You may see any of the following providers on your designated Care Team at your next follow up: Marland Kitchen Dr Glori Bickers . Dr Loralie Champagne . Darrick Grinder, NP . Lyda Jester, PA . Audry Riles, PharmD   Please be sure to bring in all your medications bottles to every appointment.

## 2019-09-30 NOTE — Progress Notes (Signed)
Date:  08/29/2019   ID:  Dylan [name], DOB 1926-07-24, MRN [id number]   Provider location: [facility] Type of Visit: Established patient  PCP:  Haywood Pao, MD  Cardiologist:  Loralie Champagne, MD   History of Present Illness: Dylan [name] is a 86 y.o. with history of DM and HTN as well as atrial fibrillation and CHF.   Patient was doing well until 11/18 when he developed exertional dyspnea.  He went to the ER 07/14/17 and was found to be in atrial fibrillation with volume overload.  He was admitted overnight, diuresed and started on Eliquis, and discharged.  He remained in atrial fibrillation and short of breath.   He was re-hospitalized with dyspnea in 12/18.  TEE-guided DCCV to NSR was done and he was diuresed. TEE showed EF 50%.    He was noted to be back in atrial fibrillation and was started on amiodarone.  TEE-guided DCCV in 1/19 was done, with conversion back to NSR.   He was admitted in 2/19 with syncope and found to have tachy-brady syndrome with junctional bradycardia (symptomatic). Medtronic dual chamber PPM was placed.   He was readmitted in 2/19 with pneumonia.  During this admission, he was noted to be back in atrial fibrillation but converted back to NSR.   In 12/20, he developed COVID-19 pneumonitis and was hospitalized at [facility].  He developed orthostatic hypotension after COVID-19.  He has been started on midodrine.   He returns for followup of atrial fibrillation and CHF.  He is doing better.  No dyspnea walking on flat ground with his walker.  SBP 110s-120s at home, occasional mild lightheadedness with standing but no falls.  He has generalized fatigue and says that his legs feel weak.  He has no chest pain.  He does have low back pain and neck pain from spinal stenosis.  He has completed PT.  Labs (12/20): hgb 12.9, TSH normal, BNP 72, LFTs normal, K 3.9, creatinine 0.88  ECG (personally reviewed): a-paced, nonspecific T  wave flattening  PMH: 1. Chronic diastolic CHF: Echo ([id number]) with EF 50-55%, mild-moderate LVH, mild AI, mild MR, normal RV size and systolic function, PASP 45 mmHg.  - TEE (12/18): EF 50%, normal RV size and systolic function, mild MR.  - TEE (1/19): EF 55-60%, mild LVH, mild MR, normal RV, aortic sclerosis without significant stenosis.  2. Atrial fibrillation: Persistent, initially diagnosed in 11/18.  - TEE-guided DCCV in 12/18.  - TEE-guided DCCV in 1/19 on amiodarone.  3. Chest pain: LHC remotely without obstructive disease.  1/16 Cardiolite with no ischemia.  4. Type II diabetes 5. HTN 6. CKD: Stage 3.  7. Tachy-brady syndrome: Medtronic dual chamber.   8. Orthostatic hypotension.  9. COVID-19 pneumonitis 10. ABIs (6/19): Normal 11. Spinal stenosis  Social History   Socioeconomic History  . Marital status: Married    Spouse name: Not on file  . Number of children: Not on file  . Years of education: Not on file  . Highest education level: Not on file  Occupational History  . Not on file  Tobacco Use  . Smoking status: Former Smoker    Packs/day: 1.00    Types: Cigarettes    Quit date: 01/07/1992    Years since quitting: 27.7  . Smokeless tobacco: Never Used  Substance and Sexual Activity  . Alcohol use: Yes    Alcohol/week: 2.0 standard drinks    Types: 2 Glasses of wine per week  .  Drug use: No  . Sexual activity: Yes  Other Topics Concern  . Not on file  Social History Narrative  . Not on file   Social Determinants of Health   Financial Resource Strain:   . Difficulty of Paying Living Expenses: Not on file  Food Insecurity:   . Worried About Charity fundraiser in the Last Year: Not on file  . Ran Out of Food in the Last Year: Not on file  Transportation Needs:   . Lack of Transportation (Medical): Not on file  . Lack of Transportation (Non-Medical): Not on file  Physical Activity:   . Days of Exercise per Week: Not on file  . Minutes of Exercise per  Session: Not on file  Stress:   . Feeling of Stress : Not on file  Social Connections:   . Frequency of Communication with Friends and Family: Not on file  . Frequency of Social Gatherings with Friends and Family: Not on file  . Attends Religious Services: Not on file  . Active Member of Clubs or Organizations: Not on file  . Attends Archivist Meetings: Not on file  . Marital Status: Not on file  Intimate Partner Violence:   . Fear of Current or Ex-Partner: Not on file  . Emotionally Abused: Not on file  . Physically Abused: Not on file  . Sexually Abused: Not on file   Family History  Problem Relation Age of Onset  . Cancer - Other Mother   . CVA Father   . Heart attack Brother   . Heart attack Maternal Grandmother    ROS: All systems reviewed and negative except as per HPI.  Current Outpatient Medications  Medication Sig Dispense Refill  . acetaminophen (TYLENOL) 325 MG tablet Take 650 mg by mouth every 6 (six) hours as needed for mild pain or headache.     Marland Kitchen amiodarone (PACERONE) 200 MG tablet Take 200 mg by mouth daily.    Marland Kitchen apixaban (ELIQUIS) 5 MG TABS tablet Take 1 tablet (5 mg total) by mouth 2 (two) times daily. 60 tablet 6  . Cholecalciferol (VITAMIN D) 2000 units CAPS Take 2,000 Units by mouth daily.     . furosemide (LASIX) 20 MG tablet Take 1 tablet (20 mg total) by mouth daily. 30 tablet 3  . midodrine (PROAMATINE) 10 MG tablet Take 1 tablet (10 mg total) by mouth 3 (three) times daily with meals. 90 tablet 3  . Omega-3 Fatty Acids (FISH OIL) 1200 MG CAPS Take 1,200 mg by mouth 2 (two) times daily.     . rosuvastatin (CRESTOR) 10 MG tablet Take 1 tablet (10 mg total) by mouth daily. 90 tablet 3  . VITAMIN E PO Take 1 tablet by mouth daily.     No current facility-administered medications for this encounter.   BP 114/70   Pulse 79   Wt 115.1 kg (253 lb 12.8 oz)   SpO2 98%   BMI 33.48 kg/m  General: NAD Neck: No JVD, no thyromegaly or thyroid  nodule.  Lungs: Clear to auscultation bilaterally with normal respiratory effort. CV: Nondisplaced PMI.  Heart regular S1/S2, no S3/S4, no murmur.  1+ ankle edema.  No carotid bruit.  Normal pedal pulses.  Abdomen: Soft, nontender, no hepatosplenomegaly, no distention.  Skin: Intact without lesions or rashes.  Neurologic: Alert and oriented x 3.  Psych: Normal affect. Extremities: No clubbing or cyanosis.  HEENT: Normal.   Assessment/Plan: 1. Atrial fibrillation: DCCV in 12/18.  Recurrence of  atrial fibrillation with initiation of amiodarone then TEE-guided DCCV in 1/19.  He has not felt palpitations. He is in NSR today.  - Continue amiodarone 200 mg daily.  Check TSH and LFTs, he will need regular eye exam.  - Continue apixaban.  2. CKD: BMET today.   3. Chronic diastolic CHF: EF 0000000 from 1/19 TEE.  Weight stable, he has mild peripheral edema but not short of breath walking around his house.  - Continue Lasix 20 mg daily.  BMET today.  4. Orthostatic hypotension: This has been present since his COVID-19 infection and may be related.  This seems to be improving though he remains on midodrine.  - For now, continue midodrine 10 mg tid.  - Hold Coreg as above.  - Wear compression stockings during the day.  5. Hyperlipidemia: Check lipids today.   Signed, Loralie Champagne, MD  09/30/2019  [facility] [address] [facility] [place] [address] [phone] (office) [phone] (fax)

## 2019-10-01 ENCOUNTER — Ambulatory Visit (INDEPENDENT_AMBULATORY_CARE_PROVIDER_SITE_OTHER): Payer: Medicare HMO | Admitting: *Deleted

## 2019-10-01 DIAGNOSIS — I5031 Acute diastolic (congestive) heart failure: Secondary | ICD-10-CM | POA: Diagnosis not present

## 2019-10-01 LAB — CUP PACEART REMOTE DEVICE CHECK
Battery Remaining Longevity: 133 mo
Battery Voltage: 3.02 V
Brady Statistic AP VP Percent: 5.76 %
Brady Statistic AP VS Percent: 93.72 %
Brady Statistic AS VP Percent: 0 %
Brady Statistic AS VS Percent: 0.52 %
Brady Statistic RA Percent Paced: 99.91 %
Brady Statistic RV Percent Paced: 5.76 %
Date Time Interrogation Session: 20210210182934
Implantable Lead Implant Date: 20190216
Implantable Lead Implant Date: 20190216
Implantable Lead Location: 753859
Implantable Lead Location: 753860
Implantable Lead Model: 5076
Implantable Lead Model: 5076
Implantable Pulse Generator Implant Date: 20190216
Lead Channel Impedance Value: 304 Ohm
Lead Channel Impedance Value: 323 Ohm
Lead Channel Impedance Value: 399 Ohm
Lead Channel Impedance Value: 437 Ohm
Lead Channel Pacing Threshold Amplitude: 0.625 V
Lead Channel Pacing Threshold Amplitude: 1.125 V
Lead Channel Pacing Threshold Pulse Width: 0.4 ms
Lead Channel Pacing Threshold Pulse Width: 0.4 ms
Lead Channel Sensing Intrinsic Amplitude: 3.375 mV
Lead Channel Sensing Intrinsic Amplitude: 3.375 mV
Lead Channel Sensing Intrinsic Amplitude: 3.875 mV
Lead Channel Sensing Intrinsic Amplitude: 3.875 mV
Lead Channel Setting Pacing Amplitude: 1.75 V
Lead Channel Setting Pacing Amplitude: 2.75 V
Lead Channel Setting Pacing Pulse Width: 0.4 ms
Lead Channel Setting Sensing Sensitivity: 1.2 mV

## 2019-10-02 NOTE — Progress Notes (Signed)
PPM Remote  

## 2019-10-14 ENCOUNTER — Ambulatory Visit: Payer: Medicare HMO | Admitting: Podiatry

## 2019-10-14 ENCOUNTER — Other Ambulatory Visit: Payer: Self-pay

## 2019-10-14 ENCOUNTER — Encounter: Payer: Self-pay | Admitting: Podiatry

## 2019-10-14 DIAGNOSIS — E1159 Type 2 diabetes mellitus with other circulatory complications: Secondary | ICD-10-CM | POA: Diagnosis not present

## 2019-10-14 DIAGNOSIS — E1140 Type 2 diabetes mellitus with diabetic neuropathy, unspecified: Secondary | ICD-10-CM | POA: Insufficient documentation

## 2019-10-14 DIAGNOSIS — M79674 Pain in right toe(s): Secondary | ICD-10-CM | POA: Diagnosis not present

## 2019-10-14 DIAGNOSIS — E1142 Type 2 diabetes mellitus with diabetic polyneuropathy: Secondary | ICD-10-CM

## 2019-10-14 DIAGNOSIS — D689 Coagulation defect, unspecified: Secondary | ICD-10-CM | POA: Diagnosis not present

## 2019-10-14 DIAGNOSIS — B351 Tinea unguium: Secondary | ICD-10-CM | POA: Diagnosis not present

## 2019-10-14 DIAGNOSIS — M79675 Pain in left toe(s): Secondary | ICD-10-CM

## 2019-10-14 NOTE — Progress Notes (Signed)
This patient presents to the office with chief complaint of long thick nails and diabetic feet. He is accompanied by his wife.   This patient  says there  is  no pain and discomfort in his  feet.  This patient says there are long thick painful nails.  These nails are painful walking and wearing shoes.  Patient has no history of infection or drainage from both feet.  Patient is unable to  self treat his own nails . This patient presents  to the office today for treatment of the  long nails and a foot evaluation due to history of  Diabetes. Patient is taking eliquiss.  General Appearance  Alert, conversant and in no acute stress.  Vascular  Dorsalis pedis and posterior tibial  pulses are palpable  Left foot.  Dorsalis pedis and posterior tibial pedis pulses are absent  B/L.Marland Kitchen  Capillary return is within normal limits  bilaterally. Temperature is within normal limits  Bilaterally. Venous stasis  B/L.  Neurologic  Senn-Weinstein monofilament wire test diminished   bilaterally. Muscle power within normal limits bilaterally.  Nails Thick disfigured discolored nails with subungual debris  from hallux to fifth toes bilaterally. No evidence of bacterial infection or drainage bilaterally.  Orthopedic  No limitations of motion of motion feet .  No crepitus or effusions noted.  No bony pathology or digital deformities noted. Hallux  Malleus  IPJ  B/L.  Hammer toes 2  B/L.  Midfoot  DJD  B/L.  HAV  B/L.  Skin  normotropic skin with no porokeratosis noted bilaterally.  No signs of infections or ulcers noted.     Onychomycosis  Diabetes with  Diet controlled.  IE  Debride nails x 10.  A diabetic foot exam was performed and there is  evidence of both  vascular or neurologic pathology.   RTC 3 months.   Gardiner Barefoot DPM

## 2019-11-04 ENCOUNTER — Telehealth (HOSPITAL_COMMUNITY): Payer: Self-pay | Admitting: Pharmacist

## 2019-11-04 DIAGNOSIS — L723 Sebaceous cyst: Secondary | ICD-10-CM | POA: Diagnosis not present

## 2019-11-04 DIAGNOSIS — D692 Other nonthrombocytopenic purpura: Secondary | ICD-10-CM | POA: Diagnosis not present

## 2019-11-04 DIAGNOSIS — B372 Candidiasis of skin and nail: Secondary | ICD-10-CM | POA: Diagnosis not present

## 2019-11-04 DIAGNOSIS — L814 Other melanin hyperpigmentation: Secondary | ICD-10-CM | POA: Diagnosis not present

## 2019-11-04 DIAGNOSIS — C44321 Squamous cell carcinoma of skin of nose: Secondary | ICD-10-CM | POA: Diagnosis not present

## 2019-11-04 DIAGNOSIS — D485 Neoplasm of uncertain behavior of skin: Secondary | ICD-10-CM | POA: Diagnosis not present

## 2019-11-04 DIAGNOSIS — D1801 Hemangioma of skin and subcutaneous tissue: Secondary | ICD-10-CM | POA: Diagnosis not present

## 2019-11-04 DIAGNOSIS — L570 Actinic keratosis: Secondary | ICD-10-CM | POA: Diagnosis not present

## 2019-11-04 DIAGNOSIS — D2239 Melanocytic nevi of other parts of face: Secondary | ICD-10-CM | POA: Diagnosis not present

## 2019-11-04 DIAGNOSIS — L821 Other seborrheic keratosis: Secondary | ICD-10-CM | POA: Diagnosis not present

## 2019-11-04 DIAGNOSIS — Z85828 Personal history of other malignant neoplasm of skin: Secondary | ICD-10-CM | POA: Diagnosis not present

## 2019-11-04 MED ORDER — AMIODARONE HCL 200 MG PO TABS: 200.0000 mg | ORAL_TABLET | Freq: Every day | ORAL | 1 refills | Status: DC

## 2019-11-04 NOTE — Telephone Encounter (Signed)
Sent refill of amiodarone to [facility] per patient request.   Audry Riles, PharmD, BCPS, BCCP, CPP Heart Failure Clinic Pharmacist [phone]

## 2019-12-22 DIAGNOSIS — Z0000 Encounter for general adult medical examination without abnormal findings: Secondary | ICD-10-CM | POA: Diagnosis not present

## 2019-12-22 DIAGNOSIS — Z125 Encounter for screening for malignant neoplasm of prostate: Secondary | ICD-10-CM | POA: Diagnosis not present

## 2019-12-22 DIAGNOSIS — E1151 Type 2 diabetes mellitus with diabetic peripheral angiopathy without gangrene: Secondary | ICD-10-CM | POA: Diagnosis not present

## 2019-12-22 DIAGNOSIS — E7800 Pure hypercholesterolemia, unspecified: Secondary | ICD-10-CM | POA: Diagnosis not present

## 2019-12-29 DIAGNOSIS — I110 Hypertensive heart disease with heart failure: Secondary | ICD-10-CM | POA: Diagnosis not present

## 2019-12-29 DIAGNOSIS — Z8616 Personal history of COVID-19: Secondary | ICD-10-CM | POA: Diagnosis not present

## 2019-12-29 DIAGNOSIS — Z950 Presence of cardiac pacemaker: Secondary | ICD-10-CM | POA: Diagnosis not present

## 2019-12-29 DIAGNOSIS — E7800 Pure hypercholesterolemia, unspecified: Secondary | ICD-10-CM | POA: Diagnosis not present

## 2019-12-29 DIAGNOSIS — R82998 Other abnormal findings in urine: Secondary | ICD-10-CM | POA: Diagnosis not present

## 2019-12-29 DIAGNOSIS — E669 Obesity, unspecified: Secondary | ICD-10-CM | POA: Diagnosis not present

## 2019-12-29 DIAGNOSIS — E1151 Type 2 diabetes mellitus with diabetic peripheral angiopathy without gangrene: Secondary | ICD-10-CM | POA: Diagnosis not present

## 2019-12-29 DIAGNOSIS — M6281 Muscle weakness (generalized): Secondary | ICD-10-CM | POA: Diagnosis not present

## 2019-12-29 DIAGNOSIS — I5032 Chronic diastolic (congestive) heart failure: Secondary | ICD-10-CM | POA: Diagnosis not present

## 2019-12-29 DIAGNOSIS — Z0000 Encounter for general adult medical examination without abnormal findings: Secondary | ICD-10-CM | POA: Diagnosis not present

## 2019-12-29 DIAGNOSIS — Z1339 Encounter for screening examination for other mental health and behavioral disorders: Secondary | ICD-10-CM | POA: Diagnosis not present

## 2019-12-29 DIAGNOSIS — Z1331 Encounter for screening for depression: Secondary | ICD-10-CM | POA: Diagnosis not present

## 2019-12-29 DIAGNOSIS — M160 Bilateral primary osteoarthritis of hip: Secondary | ICD-10-CM | POA: Diagnosis not present

## 2019-12-30 DIAGNOSIS — R69 Illness, unspecified: Secondary | ICD-10-CM | POA: Diagnosis not present

## 2019-12-31 ENCOUNTER — Ambulatory Visit (INDEPENDENT_AMBULATORY_CARE_PROVIDER_SITE_OTHER): Payer: Medicare HMO | Admitting: *Deleted

## 2019-12-31 DIAGNOSIS — I442 Atrioventricular block, complete: Secondary | ICD-10-CM

## 2019-12-31 LAB — CUP PACEART REMOTE DEVICE CHECK
Battery Remaining Longevity: 131 mo
Battery Voltage: 3.01 V
Brady Statistic AP VP Percent: 6.21 %
Brady Statistic AP VS Percent: 93.29 %
Brady Statistic AS VP Percent: 0 %
Brady Statistic AS VS Percent: 0.49 %
Brady Statistic RA Percent Paced: 99.96 %
Brady Statistic RV Percent Paced: 6.22 %
Date Time Interrogation Session: 20210512192921
Implantable Lead Implant Date: 20190216
Implantable Lead Implant Date: 20190216
Implantable Lead Location: 753859
Implantable Lead Location: 753860
Implantable Lead Model: 5076
Implantable Lead Model: 5076
Implantable Pulse Generator Implant Date: 20190216
Lead Channel Impedance Value: 304 Ohm
Lead Channel Impedance Value: 304 Ohm
Lead Channel Impedance Value: 380 Ohm
Lead Channel Impedance Value: 437 Ohm
Lead Channel Pacing Threshold Amplitude: 0.625 V
Lead Channel Pacing Threshold Amplitude: 1.125 V
Lead Channel Pacing Threshold Pulse Width: 0.4 ms
Lead Channel Pacing Threshold Pulse Width: 0.4 ms
Lead Channel Sensing Intrinsic Amplitude: 2.5 mV
Lead Channel Sensing Intrinsic Amplitude: 2.5 mV
Lead Channel Sensing Intrinsic Amplitude: 3.375 mV
Lead Channel Sensing Intrinsic Amplitude: 3.375 mV
Lead Channel Setting Pacing Amplitude: 1.75 V
Lead Channel Setting Pacing Amplitude: 2.5 V
Lead Channel Setting Pacing Pulse Width: 0.4 ms
Lead Channel Setting Sensing Sensitivity: 1.2 mV

## 2020-01-04 NOTE — Progress Notes (Signed)
Remote pacemaker transmission.   

## 2020-01-11 ENCOUNTER — Other Ambulatory Visit (HOSPITAL_COMMUNITY): Payer: Self-pay | Admitting: Cardiology

## 2020-01-11 DIAGNOSIS — Z950 Presence of cardiac pacemaker: Secondary | ICD-10-CM | POA: Insufficient documentation

## 2020-01-11 DIAGNOSIS — I495 Sick sinus syndrome: Secondary | ICD-10-CM | POA: Insufficient documentation

## 2020-01-12 ENCOUNTER — Encounter: Payer: Self-pay | Admitting: Internal Medicine

## 2020-01-12 ENCOUNTER — Other Ambulatory Visit: Payer: Self-pay

## 2020-01-12 ENCOUNTER — Ambulatory Visit: Payer: Medicare HMO | Admitting: Internal Medicine

## 2020-01-12 VITALS — BP 108/62 | HR 74 | Ht 73.0 in | Wt 254.2 lb

## 2020-01-12 DIAGNOSIS — Z950 Presence of cardiac pacemaker: Secondary | ICD-10-CM

## 2020-01-12 DIAGNOSIS — I495 Sick sinus syndrome: Secondary | ICD-10-CM | POA: Diagnosis not present

## 2020-01-12 DIAGNOSIS — I4891 Unspecified atrial fibrillation: Secondary | ICD-10-CM

## 2020-01-12 MED ORDER — AMIODARONE HCL 200 MG PO TABS: ORAL_TABLET | ORAL | 1 refills | Status: DC

## 2020-01-12 NOTE — Patient Instructions (Signed)
Medication Instructions:  Your physician has recommended you make the following change in your medication:   **  Decrease your Amiodarone to 200mg  - 1 tablet daily by mouth 5 days per week.  Monday - Friday.   Labwork: None ordered.  Testing/Procedures: None ordered.  Follow-Up: Your physician wants you to follow-up in: 6 months with Dr Caryl Comes. You will receive a reminder letter in the mail two months in advance. If you don't receive a letter, please call our office to schedule the follow-up appointment.  Remote monitoring is used to monitor your Pacemaker of ICD from home. This monitoring reduces the number of office visits required to check your device to one time per year. It allows [place] to keep an eye on the functioning of your device to ensure it is working properly.   Any Other Special Instructions Will Be Listed Below (If Applicable).  Purchase Abdominal binder and Thigh sleeves as  discuused by Dr Caryl Comes.  If you need a refill on your cardiac medications before your next appointment, please call your pharmacy.

## 2020-01-12 NOTE — Progress Notes (Signed)
Patient Care Team: Tisovec, Fransico Him, MD as PCP - General (Internal Medicine) Larey Dresser, MD as PCP - Cardiology (Cardiology)   HPI  Dylan [name] is a 86 y.o. male Seen in follow-up for pacemaker implantation for bradycardia and presyncope.  He was found to have significant orthostatic hypotension; despite discontinuation of beta-blockers he had recurrent profound bradycardia and underwent pacing 2/19.     DATE TEST EF   1/19 TEE  55-60 %         Date Cr K Hgb TSH LFTs  3/19 1.29 3.7 13.5 3.36 22  2/21 1.03 3.8 13.0 2.966 17     Past Medical History:  Diagnosis Date  . COVID-19   . Diabetes mellitus without complication ([facility])   . Dyspnea   . History of hiatal hernia   . Hyperglycemia   . Hyperlipidemia   . Hypertension   . Lung nodule   . Myocardial infarction ([place] [facility])   . Presence of permanent cardiac pacemaker     Past Surgical History:  Procedure Laterality Date  . APPENDECTOMY  1956  . BACK SURGERY  2008  . CARDIAC CATHETERIZATION  1995   negative results  . CARDIOVERSION N/A 08/05/2017   Procedure: CARDIOVERSION;  Surgeon: Larey Dresser, MD;  Location: [facility] ENDOSCOPY;  Service: Cardiovascular;  Laterality: N/A;  . CARDIOVERSION N/A 09/12/2017   Procedure: CARDIOVERSION;  Surgeon: Larey Dresser, MD;  Location: [facility] ENDOSCOPY;  Service: Cardiovascular;  Laterality: N/A;  . COLON SURGERY  07/09/12  . [place]  . HOT HEMOSTASIS  01/08/2012   Procedure: HOT HEMOSTASIS (ARGON PLASMA COAGULATION/BICAP);  Surgeon: Arta Silence, MD;  Location: [facility] ENDOSCOPY;  Service: Endoscopy;  Laterality: N/A;  . LAPAROSCOPIC ILEOCECECTOMY  07/09/2012   Procedure: LAPAROSCOPIC ILEOCECECTOMY;  Surgeon: Edward Jolly, MD;  Location: [facility] ORS;  Service: General;  Laterality: N/A;  Laparoscopic Ileocecectomy  . PACEMAKER IMPLANT N/A 10/05/2017   Procedure: PACEMAKER IMPLANT;  Surgeon: Deboraha Sprang, MD;  Location: [facility] CV LAB;  Service:  Cardiovascular;  Laterality: N/A;  . REPLACEMENT TOTAL KNEE  2010  . TEE WITHOUT CARDIOVERSION N/A 08/05/2017   Procedure: TRANSESOPHAGEAL ECHOCARDIOGRAM (TEE);  Surgeon: Larey Dresser, MD;  Location: [facility] ENDOSCOPY;  Service: Cardiovascular;  Laterality: N/A;  . TEE WITHOUT CARDIOVERSION N/A 09/12/2017   Procedure: TRANSESOPHAGEAL ECHOCARDIOGRAM (TEE);  Surgeon: Larey Dresser, MD;  Location: [facility] ENDOSCOPY;  Service: Cardiovascular;  Laterality: N/A;    Current Meds  Medication Sig  . acetaminophen (TYLENOL) 325 MG tablet Take 650 mg by mouth every 6 (six) hours as needed for mild pain or headache.   Marland Kitchen amiodarone (PACERONE) 200 MG tablet Take 1 tablet (200 mg total) by mouth daily.  Marland Kitchen apixaban (ELIQUIS) 5 MG TABS tablet Take 1 tablet (5 mg total) by mouth 2 (two) times daily.  . Cholecalciferol (VITAMIN D) 2000 units CAPS Take 2,000 Units by mouth daily.   . furosemide (LASIX) 20 MG tablet Take 1 tablet (20 mg total) by mouth daily.  . Omega-3 Fatty Acids (FISH OIL) 1200 MG CAPS Take 1,200 mg by mouth daily.   . rosuvastatin (CRESTOR) 10 MG tablet Take 1 tablet (10 mg total) by mouth daily.  Marland Kitchen VITAMIN E PO Take 1 tablet by mouth daily.  . [DISCONTINUED] midodrine (PROAMATINE) 10 MG tablet Take 1 tablet (10 mg total) by mouth 3 (three) times daily with meals.    Allergies  Allergen Reactions  . Keflex [Cephalexin] Hives  and Other (See Comments)    Occurred in the 1980's      Review of Systems negative except from HPI and PMH  Physical Exam BP 108/62   Pulse 74   Ht 6\' 1"  (1.854 m)   Wt 254 lb 3.2 oz (115.3 kg)   SpO2 97%   BMI 33.54 kg/m  Well developed and well nourished in no acute distress HENT normal Neck supple with JVP-flat Clear Device pocket well healed; without hematoma or erythema.  There is no tethering  Regular rate and rhythm, no   gallop No   /  murmur Abd-soft with active BS No Clubbing cyanosis   edema Skin-warm and dry A & Oriented  Grossly normal  sensory and motor function  ECG AV pacing     Assessment and  Plan Sinus node dysfunction  Orthostatic intolerance  Pacemaker-Medtronic   First-degree AV block  Atrial fibrillation  Hypertension  High risk medication surveillance   No intercurrent atrial fibrillation or flutter and With the orthostasis, we will decrease his amiodarone 1400--1000 mg a week   amiodarone surveillance laboratories were normal 2/21  On Anticoagulation;  No bleeding issues   Dizziness still an issue and reviewed the role of compression wear, specifically thigh sleeves and abdominal binder.  We spent more than 50% of our >25 min visit in face to face counseling regarding the above          Current medicines are reviewed at length with the patient today .  The patient does not  have concerns regarding medicines.

## 2020-01-13 ENCOUNTER — Ambulatory Visit: Payer: Medicare HMO | Admitting: Podiatry

## 2020-01-13 ENCOUNTER — Encounter: Payer: Self-pay | Admitting: Podiatry

## 2020-01-13 VITALS — Temp 97.7°F

## 2020-01-13 DIAGNOSIS — E1142 Type 2 diabetes mellitus with diabetic polyneuropathy: Secondary | ICD-10-CM

## 2020-01-13 DIAGNOSIS — B351 Tinea unguium: Secondary | ICD-10-CM | POA: Diagnosis not present

## 2020-01-13 DIAGNOSIS — M79675 Pain in left toe(s): Secondary | ICD-10-CM | POA: Diagnosis not present

## 2020-01-13 DIAGNOSIS — D689 Coagulation defect, unspecified: Secondary | ICD-10-CM | POA: Diagnosis not present

## 2020-01-13 DIAGNOSIS — M79674 Pain in right toe(s): Secondary | ICD-10-CM | POA: Diagnosis not present

## 2020-01-13 NOTE — Progress Notes (Signed)
This patient returns to my office for at risk foot care.  This patient requires this care by a professional since this patient will be at risk due to having diabetic neuropathy,and coagulation defect.  Patient is taking eliquis.  Patient says his second toenail right foot has turned black but is causing no pain.  Patient presents to the office with his wife.  This patient is unable to cut nails himself since the patient cannot reach his nails.These nails are painful walking and wearing shoes.  This patient presents for at risk foot care today.  General Appearance  Alert, conversant and in no acute stress.  Vascular  Dorsalis pedis and posterior tibial  pulses are palpable  Left foot . Absent dorsalis pedis and posterior tibial pulses right foot.  Capillary return is within normal limits  bilaterally. Temperature is within normal limits  Bilaterally.  Venous stasis  B/L.  Neurologic  Senn-Weinstein monofilament wire test diminished  bilaterally. Muscle power within normal limits bilaterally.  Nails Thick disfigured discolored nails with subungual debris  from hallux to fifth toes bilaterally. No evidence of bacterial infection or drainage bilaterally.  Orthopedic  No limitations of motion  feet .  No crepitus or effusions noted.  Hallux malleus IPJ  B/L.  Hammer toes 2  B/L.  Midfoot DJD  B/L.  HAV  B/L.    Skin  normotropic skin with no porokeratosis noted bilaterally.  No signs of infections or ulcers noted.     Onychomycosis  Pain in right toes  Pain in left toes  Consent was obtained for treatment procedures.   Mechanical debridement of nails 1-5  bilaterally performed with a nail nipper.  Filed with dremel without incident. Visual inspection was performed.   Return office visit    3 months                  Told patient to return for periodic foot care and evaluation due to potential at risk complications.   Atilano Covelli DPM  

## 2020-01-25 DIAGNOSIS — R69 Illness, unspecified: Secondary | ICD-10-CM | POA: Diagnosis not present

## 2020-01-27 ENCOUNTER — Ambulatory Visit (HOSPITAL_COMMUNITY)
Admission: RE | Admit: 2020-01-27 | Discharge: 2020-01-27 | Disposition: A | Discharge status: Home / Self Care | Payer: Medicare HMO | Source: Ambulatory Visit | Attending: Cardiology | Admitting: Cardiology

## 2020-01-27 ENCOUNTER — Other Ambulatory Visit: Payer: Self-pay

## 2020-01-27 ENCOUNTER — Encounter (HOSPITAL_COMMUNITY): Payer: Self-pay | Admitting: Cardiology

## 2020-01-27 VITALS — BP 124/80 | HR 68 | Ht 73.0 in | Wt 249.8 lb

## 2020-01-27 DIAGNOSIS — M545 Low back pain: Secondary | ICD-10-CM | POA: Diagnosis not present

## 2020-01-27 DIAGNOSIS — E785 Hyperlipidemia, unspecified: Secondary | ICD-10-CM | POA: Insufficient documentation

## 2020-01-27 DIAGNOSIS — I951 Orthostatic hypotension: Secondary | ICD-10-CM | POA: Insufficient documentation

## 2020-01-27 DIAGNOSIS — I130 Hypertensive heart and chronic kidney disease with heart failure and stage 1 through stage 4 chronic kidney disease, or unspecified chronic kidney disease: Secondary | ICD-10-CM | POA: Diagnosis not present

## 2020-01-27 DIAGNOSIS — I5032 Chronic diastolic (congestive) heart failure: Secondary | ICD-10-CM

## 2020-01-27 DIAGNOSIS — Z79899 Other long term (current) drug therapy: Secondary | ICD-10-CM | POA: Insufficient documentation

## 2020-01-27 DIAGNOSIS — E1122 Type 2 diabetes mellitus with diabetic chronic kidney disease: Secondary | ICD-10-CM | POA: Diagnosis not present

## 2020-01-27 DIAGNOSIS — I4891 Unspecified atrial fibrillation: Secondary | ICD-10-CM | POA: Diagnosis not present

## 2020-01-27 DIAGNOSIS — M4800 Spinal stenosis, site unspecified: Secondary | ICD-10-CM | POA: Diagnosis not present

## 2020-01-27 DIAGNOSIS — N183 Chronic kidney disease, stage 3 unspecified: Secondary | ICD-10-CM | POA: Diagnosis not present

## 2020-01-27 DIAGNOSIS — Z87891 Personal history of nicotine dependence: Secondary | ICD-10-CM | POA: Diagnosis not present

## 2020-01-27 DIAGNOSIS — Z8616 Personal history of COVID-19: Secondary | ICD-10-CM | POA: Diagnosis not present

## 2020-01-27 DIAGNOSIS — Z7901 Long term (current) use of anticoagulants: Secondary | ICD-10-CM | POA: Diagnosis not present

## 2020-01-27 DIAGNOSIS — I495 Sick sinus syndrome: Secondary | ICD-10-CM | POA: Insufficient documentation

## 2020-01-27 DIAGNOSIS — Z950 Presence of cardiac pacemaker: Secondary | ICD-10-CM | POA: Diagnosis not present

## 2020-01-27 DIAGNOSIS — Z8249 Family history of ischemic heart disease and other diseases of the circulatory system: Secondary | ICD-10-CM | POA: Diagnosis not present

## 2020-01-27 DIAGNOSIS — G8929 Other chronic pain: Secondary | ICD-10-CM | POA: Diagnosis not present

## 2020-01-27 LAB — CBC
HCT: 40.2 % (ref 39.0–52.0)
Hemoglobin: 13.2 g/dL (ref 13.0–17.0)
MCH: 31.9 pg (ref 26.0–34.0)
MCHC: 32.8 g/dL (ref 30.0–36.0)
MCV: 97.1 fL (ref 80.0–100.0)
Platelets: 145 10*3/uL — ABNORMAL LOW (ref 150–400)
RBC: 4.14 MIL/uL — ABNORMAL LOW (ref 4.22–5.81)
RDW: 13.2 % (ref 11.5–15.5)
WBC: 5.6 10*3/uL (ref 4.0–10.5)
nRBC: 0 % (ref 0.0–0.2)

## 2020-01-27 LAB — COMPREHENSIVE METABOLIC PANEL
ALT: 19 U/L (ref 0–44)
AST: 21 U/L (ref 15–41)
Albumin: 3.7 g/dL (ref 3.5–5.0)
Alkaline Phosphatase: 66 U/L (ref 38–126)
Anion gap: 10 (ref 5–15)
BUN: 15 mg/dL (ref 8–23)
CO2: 24 mmol/L (ref 22–32)
Calcium: 9 mg/dL (ref 8.9–10.3)
Chloride: 103 mmol/L (ref 98–111)
Creatinine, Ser: 1.19 mg/dL (ref 0.61–1.24)
GFR calc Af Amer: >60 mL/min (ref >60)
GFR calc non Af Amer: 53 mL/min — ABNORMAL LOW (ref >60)
Glucose, Bld: 100 mg/dL — ABNORMAL HIGH (ref 70–99)
Potassium: 4 mmol/L (ref 3.5–5.1)
Sodium: 137 mmol/L (ref 135–145)
Total Bilirubin: 1.6 mg/dL — ABNORMAL HIGH (ref 0.3–1.2)
Total Protein: 6.2 g/dL — ABNORMAL LOW (ref 6.5–8.1)

## 2020-01-27 LAB — TSH: TSH: 2.255 u[IU]/mL (ref 0.350–4.500)

## 2020-01-27 MED ORDER — MIDODRINE HCL 10 MG PO TABS: 5.0000 mg | ORAL_TABLET | Freq: Three times a day (TID) | ORAL | 3 refills | Status: DC

## 2020-01-27 NOTE — Patient Instructions (Signed)
Decrease Midodrine to 5 mg (1/2 tab) Three times a day, can increase back up to 10 mg if you feel lightheaded  Labs done today, your results will be available in MyChart, we will contact you for abnormal readings.  Your physician recommends that you schedule a follow-up appointment in: 3 months  If you have any questions or concerns before your next appointment please send [place] a message through [place] or call our office at [phone].    TO LEAVE A MESSAGE FOR THE NURSE SELECT OPTION 2, PLEASE LEAVE A MESSAGE INCLUDING: . YOUR NAME . DATE OF BIRTH . CALL BACK NUMBER . REASON FOR CALL**this is important as we prioritize the call backs  [place] AS LONG AS YOU CALL BEFORE 4:00 PM  At the [facility], you and your health needs are our priority. As part of our continuing mission to provide you with exceptional heart care, we have created designated Provider Care Teams. These Care Teams include your primary Cardiologist (physician) and Advanced Practice Providers (APPs- Physician Assistants and Nurse Practitioners) who all work together to provide you with the care you need, when you need it.   You may see any of the following providers on your designated Care Team at your next follow up: Marland Kitchen Dr Glori Bickers . Dr Loralie Champagne . Darrick Grinder, NP . Lyda Jester, PA . Audry Riles, PharmD   Please be sure to bring in all your medications bottles to every appointment.

## 2020-01-27 NOTE — Progress Notes (Signed)
ID:  Dylan [name], DOB 07/02/1926, MRN [id number]   Provider location: [place] Type of Visit: Established patient  PCP:  Haywood Pao, MD  Cardiologist:  Loralie Champagne, MD   History of Present Illness: Dylan [name] is a 86 y.o. with history of DM and HTN as well as atrial fibrillation and CHF.   Patient was doing well until 11/18 when he developed exertional dyspnea.  He went to the ER 07/14/17 and was found to be in atrial fibrillation with volume overload.  He was admitted overnight, diuresed and started on Eliquis, and discharged.  He remained in atrial fibrillation and short of breath.   He was re-hospitalized with dyspnea in 12/18.  TEE-guided DCCV to NSR was done and he was diuresed. TEE showed EF 50%.    He was noted to be back in atrial fibrillation and was started on amiodarone.  TEE-guided DCCV in 1/19 was done, with conversion back to NSR.   He was admitted in 2/19 with syncope and found to have tachy-brady syndrome with junctional bradycardia (symptomatic). Medtronic dual chamber PPM was placed.   He was readmitted in 2/19 with pneumonia.  During this admission, he was noted to be back in atrial fibrillation but converted back to NSR.   In 12/20, he developed COVID-19 pneumonitis and was hospitalized at [facility].  He developed orthostatic hypotension after COVID-19.  He was started on midodrine.   He returns for followup of atrial fibrillation and CHF.  Weight is down 4 lbs.  Main complaint is weak legs and chronic low back pain.  Weak legs thought to be related to spinal stenosis.  No falls.  He denies lightheadedness.  No palpitations.  Using walking because of leg weakness.  Wearing compression stockings.  No chest pain. No dyspnea walking around house or into the office today.   Labs (12/20): hgb 12.9, TSH normal, BNP 72, LFTs normal, K 3.9, creatinine 0.88 Labs (2/21): TSH normal, hgb 13, LDL 52, K 3.8, creatinine 1.03,  LFTs normal  ECG (personally reviewed): a-paced  PMH: 1. Chronic diastolic CHF: Echo (80/03) with EF 50-55%, mild-moderate LVH, mild AI, mild MR, normal RV size and systolic function, PASP 45 mmHg.  - TEE (12/18): EF 50%, normal RV size and systolic function, mild MR.  - TEE (1/19): EF 55-60%, mild LVH, mild MR, normal RV, aortic sclerosis without significant stenosis.  2. Atrial fibrillation: Persistent, initially diagnosed in 11/18.  - TEE-guided DCCV in 12/18.  - TEE-guided DCCV in 1/19 on amiodarone.  3. Chest pain: LHC remotely without obstructive disease.  1/16 Cardiolite with no ischemia.  4. Type II diabetes 5. HTN 6. CKD: Stage 3.  7. Tachy-brady syndrome: Medtronic dual chamber.   8. Orthostatic hypotension.  9. COVID-19 pneumonitis 10. ABIs (6/19): Normal 11. Spinal stenosis  Social History   Socioeconomic History  . Marital status: Married    Spouse name: Not on file  . Number of children: Not on file  . Years of education: Not on file  . Highest education level: Not on file  Occupational History  . Not on file  Tobacco Use  . Smoking status: Former Smoker    Packs/day: 1.00    Types: Cigarettes    Quit date: 01/07/1992    Years since quitting: 28.0  . Smokeless tobacco: Never Used  Substance and Sexual Activity  . Alcohol use: Not Currently    Alcohol/week: 2.0 standard drinks    Types: 2  Glasses of wine per week  . Drug use: No  . Sexual activity: Yes  Other Topics Concern  . Not on file  Social History Narrative  . Not on file   Social Determinants of Health   Financial Resource Strain:   . Difficulty of Paying Living Expenses:   Food Insecurity:   . Worried About Charity fundraiser in the Last Year:   . Arboriculturist in the Last Year:   Transportation Needs:   . Film/video editor (Medical):   Marland Kitchen Lack of Transportation (Non-Medical):   Physical Activity:   . Days of Exercise per Week:   . Minutes of Exercise per Session:   Stress:    . Feeling of Stress :   Social Connections:   . Frequency of Communication with Friends and Family:   . Frequency of Social Gatherings with Friends and Family:   . Attends Religious Services:   . Active Member of Clubs or Organizations:   . Attends Archivist Meetings:   Marland Kitchen Marital Status:   Intimate Partner Violence:   . Fear of Current or Ex-Partner:   . Emotionally Abused:   Marland Kitchen Physically Abused:   . Sexually Abused:    Family History  Problem Relation Age of Onset  . Cancer - Other Mother   . CVA Father   . Heart attack Brother   . Heart attack Maternal Grandmother    ROS: All systems reviewed and negative except as per HPI.  Current Outpatient Medications  Medication Sig Dispense Refill  . acetaminophen (TYLENOL) 325 MG tablet Take 650 mg by mouth every 6 (six) hours as needed for mild pain or headache.     Marland Kitchen amiodarone (PACERONE) 200 MG tablet Take one tablet - 200mg  by mouth Monday - Friday 90 tablet 1  . apixaban (ELIQUIS) 5 MG TABS tablet Take 1 tablet (5 mg total) by mouth 2 (two) times daily. 60 tablet 6  . Cholecalciferol (VITAMIN D) 2000 units CAPS Take 2,000 Units by mouth daily.     . furosemide (LASIX) 20 MG tablet Take 1 tablet (20 mg total) by mouth daily. 30 tablet 3  . midodrine (PROAMATINE) 10 MG tablet Take 0.5 tablets (5 mg total) by mouth 3 (three) times daily with meals. 270 tablet 3  . Omega-3 Fatty Acids (FISH OIL) 1200 MG CAPS Take 1,200 mg by mouth daily.     . rosuvastatin (CRESTOR) 10 MG tablet Take 1 tablet (10 mg total) by mouth daily. 90 tablet 3  . VITAMIN E PO Take 1 tablet by mouth daily.     No current facility-administered medications for this encounter.   BP 124/80   Pulse 68   Ht 6\' 1"  (1.854 m)   Wt 113.3 kg (249 lb 12.8 oz)   SpO2 96%   BMI 32.96 kg/m  General: NAD Neck: No JVD, no thyromegaly or thyroid nodule.  Lungs: Clear to auscultation bilaterally with normal respiratory effort. CV: Nondisplaced PMI.  Heart  regular S1/S2, no S3/S4, no murmur.  No peripheral edema.  No carotid bruit.  Normal pedal pulses.  Abdomen: Soft, nontender, no hepatosplenomegaly, no distention.  Skin: Intact without lesions or rashes.  Neurologic: Alert and oriented x 3.  Psych: Normal affect. Extremities: No clubbing or cyanosis.  HEENT: Normal.   Assessment/Plan: 1. Atrial fibrillation: DCCV in 12/18.  Recurrence of atrial fibrillation with initiation of amiodarone then TEE-guided DCCV in 1/19.  He has not felt palpitations. He is in  NSR today.  - Continue amiodarone 200 mg daily.  Check LFTs/TSH today, he will need a regular eye exam.   - Continue apixaban.  2. CKD: BMET today.   3. Chronic diastolic CHF: EF 01-74% from 1/19 TEE.  Weight down, he is not short of breath walking around his house.  - Continue Lasix 20 mg daily.  BMET today.  4. Orthostatic hypotension: This has been present since his COVID-19 infection and may be related.  This seems to be improving though he remains on midodrine. Main issue now seems leg weakness due to spinal stenosis rather than orthostatic hypotension.  - I will decrease midodrine to 5 mg tid.  He can increase again if he gets lightheaded.  - Continue to wear compression stockings during the day.  5. Hyperlipidemia: Good lipids in 2/21.   Followup in 3 months.   Signed, Loralie Champagne, MD  01/27/2020  [place] [address] [facility] and [place] [address] [phone] (office) [phone] (fax)

## 2020-01-30 IMAGING — CT CT HEAD W/O CM
3 of 4 series · 16 of 47 positions shown, 19 images · non-contrast
Comparison: CT head dated July 09, 2018.

CLINICAL DATA: Frequent falls for the past week. On Eliquis. Right
leg weakness.

EXAM:
CT HEAD WITHOUT CONTRAST
TECHNIQUE: Contiguous axial images were obtained from the base of the skull
through the vertex without intravenous contrast.

[Series 4: head 2.0 h70h · axial · 0.45mm/px · z∈[-178,-26]mm · 10 of 90 slices shown, 13 images]
[im 9/90  brain]
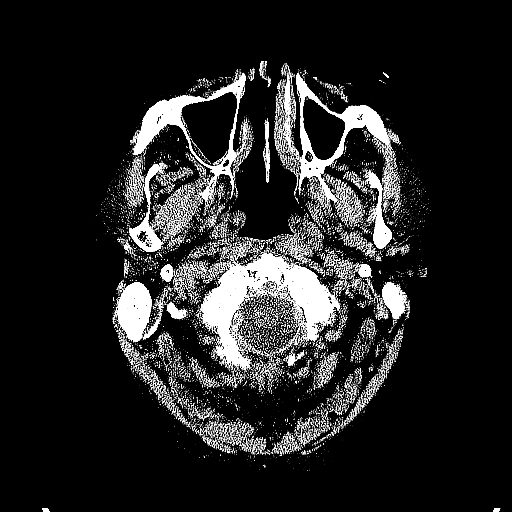
[im 9/90  bone]
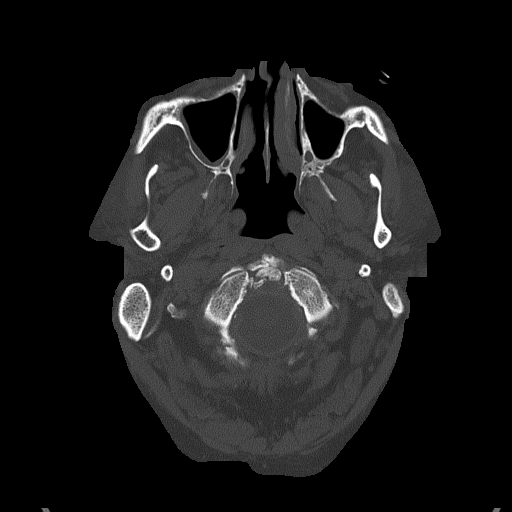
[im 17/90  brain]
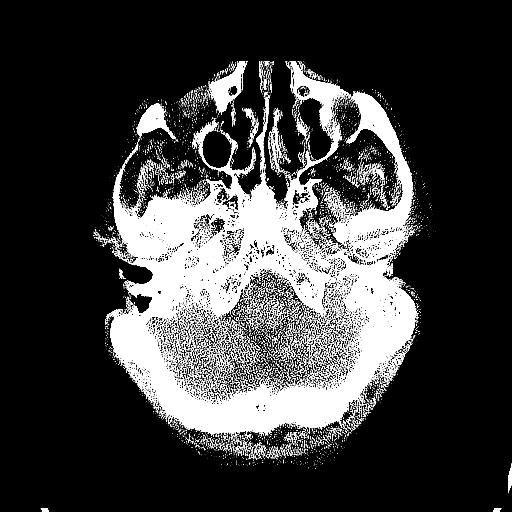
[im 26/90  brain]
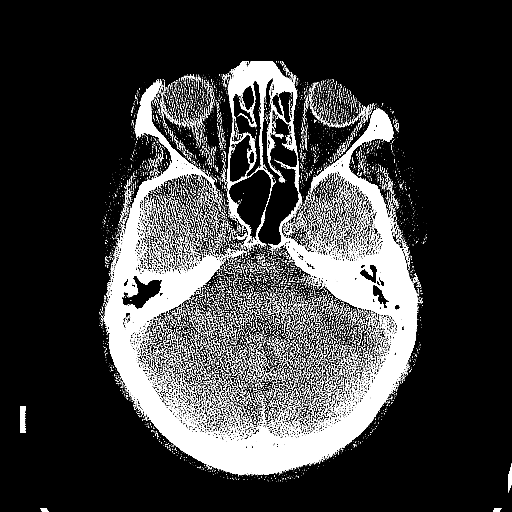
[im 34/90  brain]
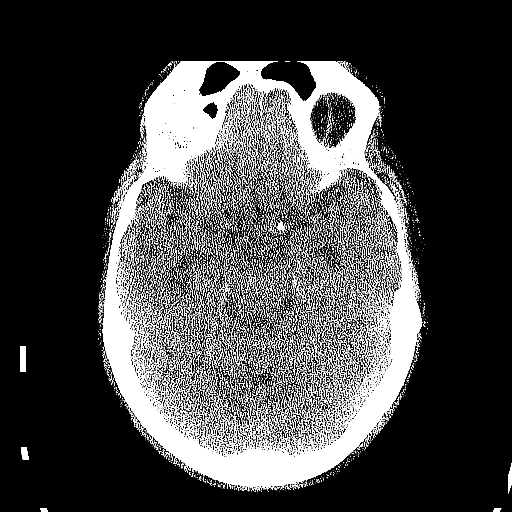
[im 43/90  brain]
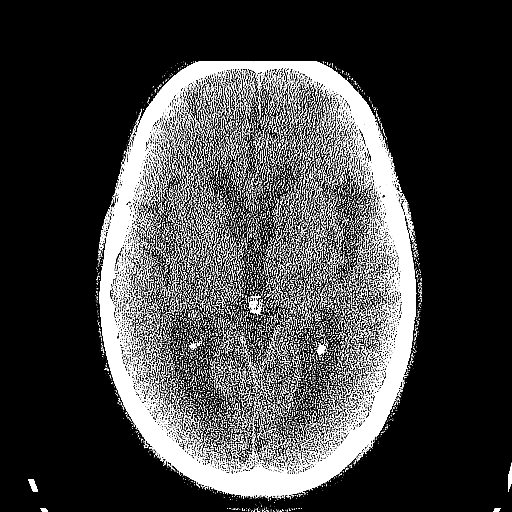
[im 43/90  bone]
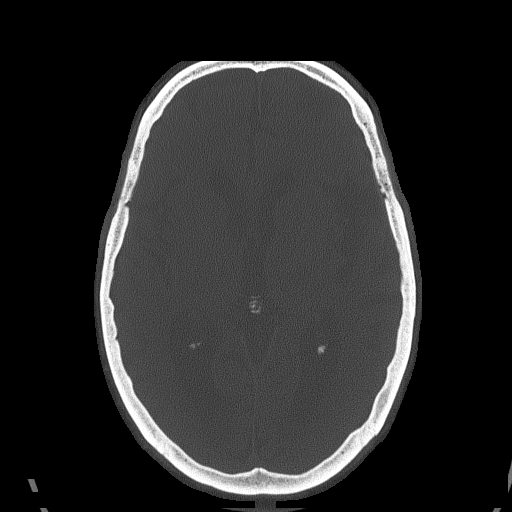
[im 51/90  brain]
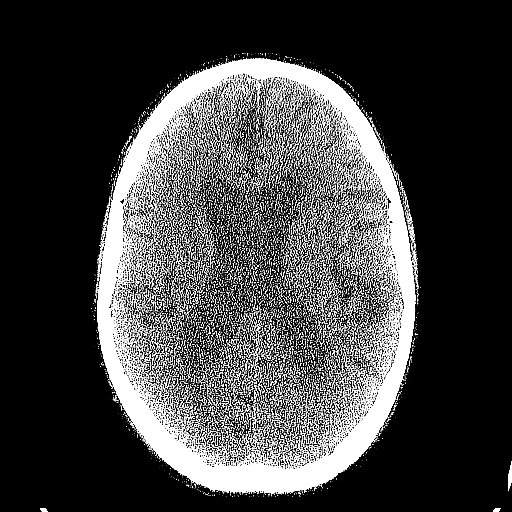
[im 60/90  brain]
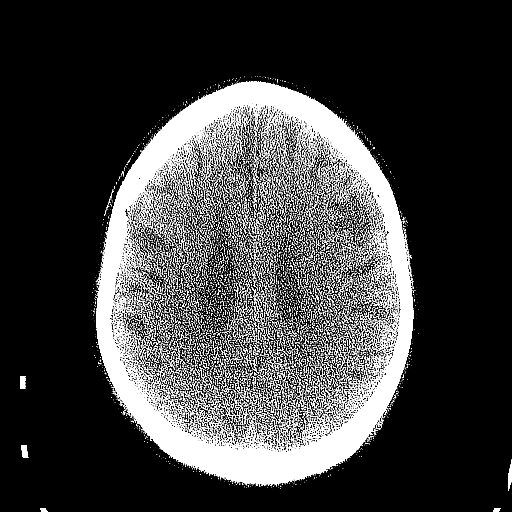
[im 68/90  brain]
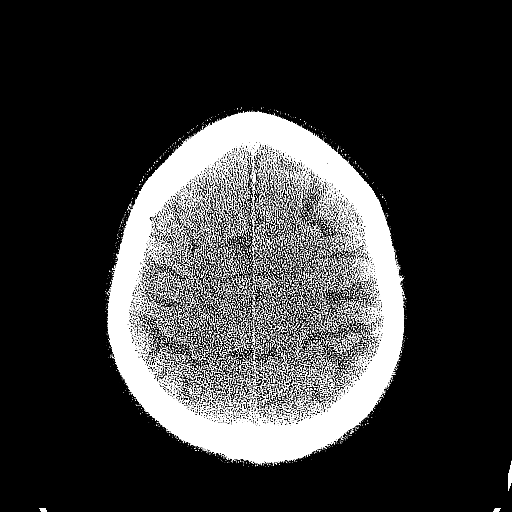
[im 77/90  brain]
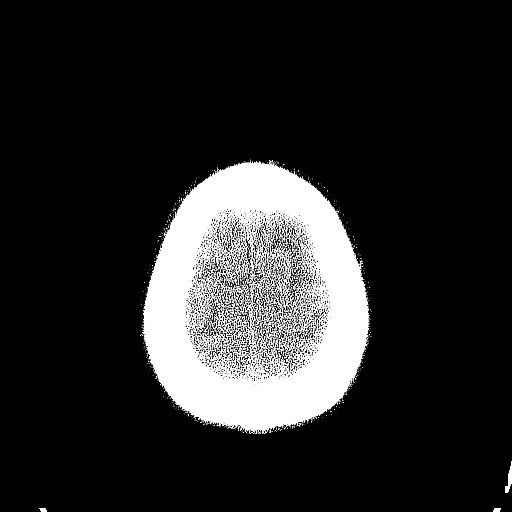
[im 77/90  bone]
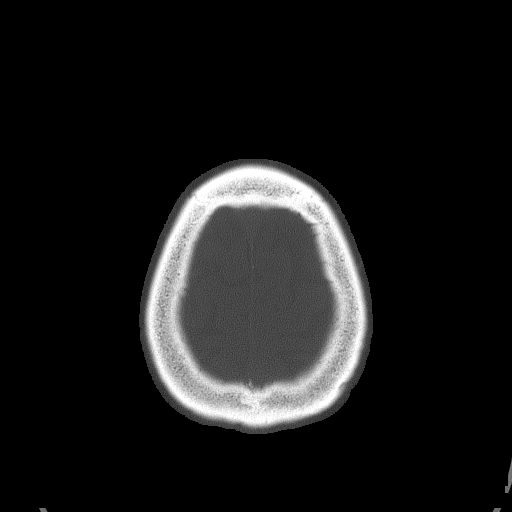
[im 85/90  brain]
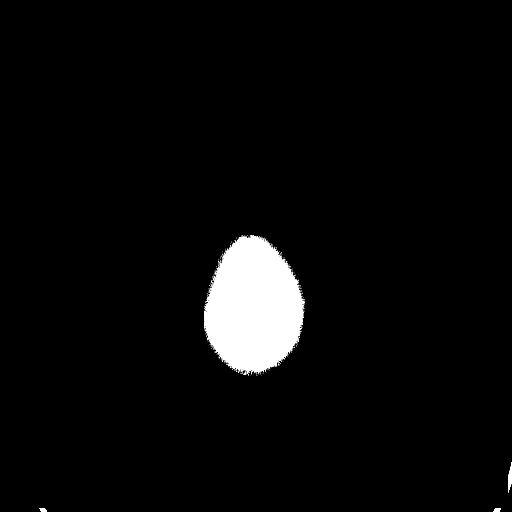

[Series 5: head 3.0 mpr cor · coronal · 0.37mm/px · 3 of 78 slices shown]
[im 26/78  brain]
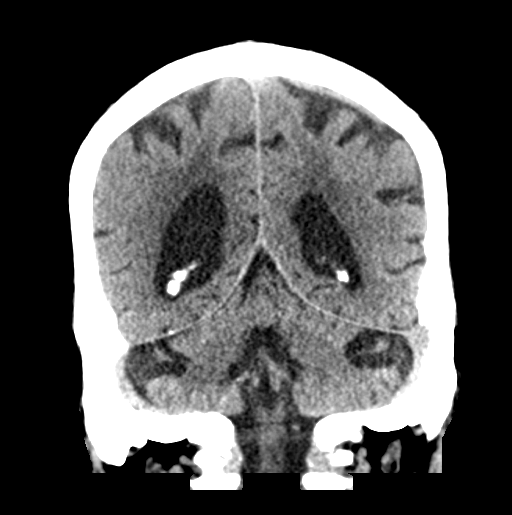
[im 35/78  brain]
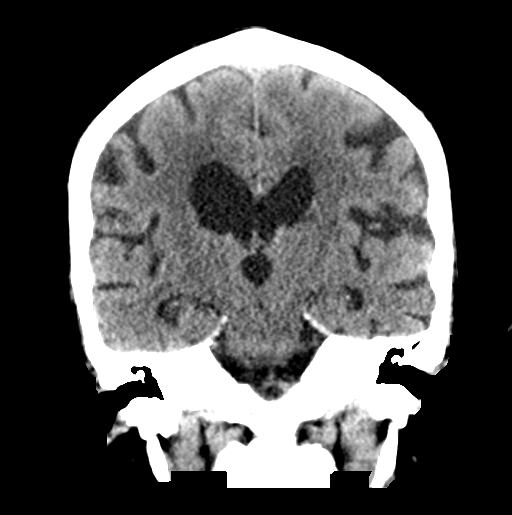
[im 43/78  brain]
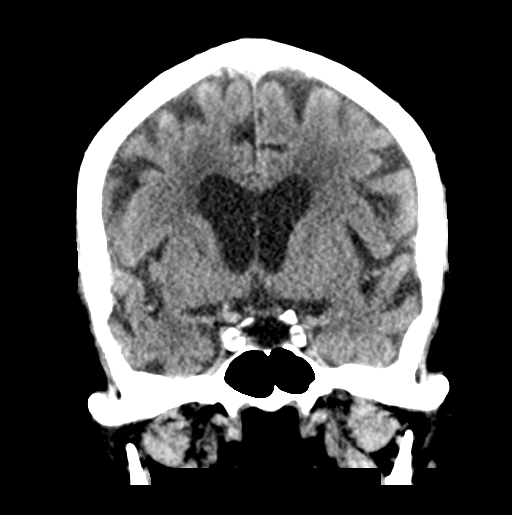

[Series 6: head 3.0 mpr sag · sagittal · 0.42mm/px · 3 of 65 slices shown]
[im 22/65  brain]
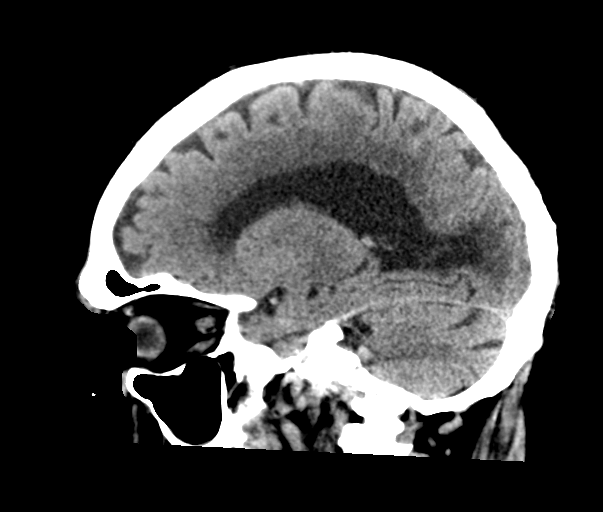
[im 33/65  brain]
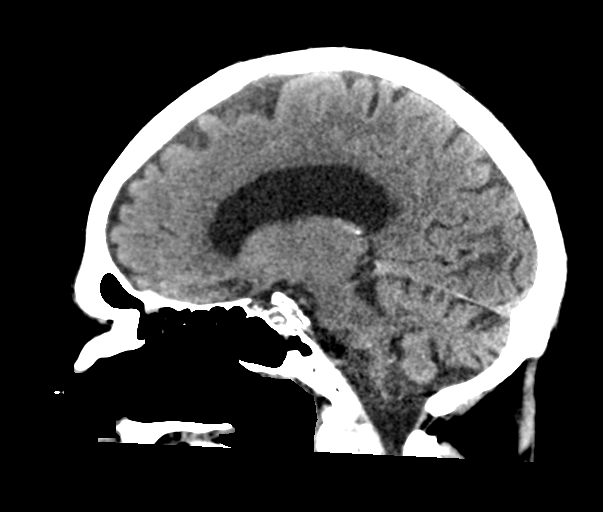
[im 43/65  brain]
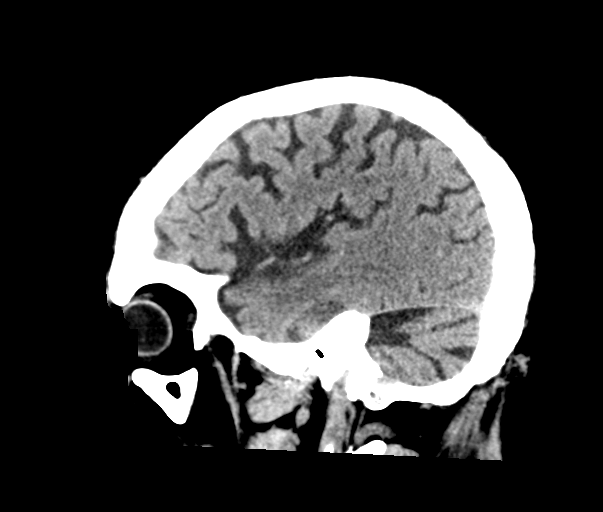

[16 of 47 positions shown; findings below may reference images not displayed]

FINDINGS: Brain: No evidence of acute infarction, hemorrhage, hydrocephalus,
extra-axial collection or mass lesion/mass effect. Stable atrophy
and chronic microvascular ischemic changes. Old small lacunar
infarct in the right thalamus again noted.

Vascular: Calcified atherosclerosis at the skullbase. No hyperdense
vessel.

Skull: Normal. Negative for fracture or focal lesion.

Sinuses/Orbits: No acute finding.  Prior right mastoidectomy.

Other: None.
IMPRESSION: 1.  No acute intracranial abnormality.

## 2020-02-18 DIAGNOSIS — H43813 Vitreous degeneration, bilateral: Secondary | ICD-10-CM | POA: Diagnosis not present

## 2020-02-18 DIAGNOSIS — H1132 Conjunctival hemorrhage, left eye: Secondary | ICD-10-CM | POA: Diagnosis not present

## 2020-02-18 DIAGNOSIS — E119 Type 2 diabetes mellitus without complications: Secondary | ICD-10-CM | POA: Diagnosis not present

## 2020-02-18 DIAGNOSIS — Z961 Presence of intraocular lens: Secondary | ICD-10-CM | POA: Diagnosis not present

## 2020-03-26 DIAGNOSIS — I951 Orthostatic hypotension: Secondary | ICD-10-CM | POA: Diagnosis not present

## 2020-03-26 DIAGNOSIS — D6869 Other thrombophilia: Secondary | ICD-10-CM | POA: Diagnosis not present

## 2020-03-26 DIAGNOSIS — Z008 Encounter for other general examination: Secondary | ICD-10-CM | POA: Diagnosis not present

## 2020-03-26 DIAGNOSIS — E785 Hyperlipidemia, unspecified: Secondary | ICD-10-CM | POA: Diagnosis not present

## 2020-03-26 DIAGNOSIS — E261 Secondary hyperaldosteronism: Secondary | ICD-10-CM | POA: Diagnosis not present

## 2020-03-26 DIAGNOSIS — E669 Obesity, unspecified: Secondary | ICD-10-CM | POA: Diagnosis not present

## 2020-03-26 DIAGNOSIS — I509 Heart failure, unspecified: Secondary | ICD-10-CM | POA: Diagnosis not present

## 2020-03-26 DIAGNOSIS — N529 Male erectile dysfunction, unspecified: Secondary | ICD-10-CM | POA: Diagnosis not present

## 2020-03-26 DIAGNOSIS — I4892 Unspecified atrial flutter: Secondary | ICD-10-CM | POA: Diagnosis not present

## 2020-03-26 DIAGNOSIS — I739 Peripheral vascular disease, unspecified: Secondary | ICD-10-CM | POA: Diagnosis not present

## 2020-03-26 DIAGNOSIS — I4891 Unspecified atrial fibrillation: Secondary | ICD-10-CM | POA: Diagnosis not present

## 2020-03-31 ENCOUNTER — Ambulatory Visit (INDEPENDENT_AMBULATORY_CARE_PROVIDER_SITE_OTHER): Payer: Medicare HMO | Admitting: *Deleted

## 2020-03-31 DIAGNOSIS — I5032 Chronic diastolic (congestive) heart failure: Secondary | ICD-10-CM

## 2020-03-31 LAB — CUP PACEART REMOTE DEVICE CHECK
Battery Remaining Longevity: 128 mo
Battery Voltage: 3.01 V
Brady Statistic AP VP Percent: 9.79 %
Brady Statistic AP VS Percent: 89.59 %
Brady Statistic AS VP Percent: 0 %
Brady Statistic AS VS Percent: 0.62 %
Brady Statistic RA Percent Paced: 99.97 %
Brady Statistic RV Percent Paced: 9.79 %
Date Time Interrogation Session: 20210812060337
Implantable Lead Implant Date: 20190216
Implantable Lead Implant Date: 20190216
Implantable Lead Location: 753859
Implantable Lead Location: 753860
Implantable Lead Model: 5076
Implantable Lead Model: 5076
Implantable Pulse Generator Implant Date: 20190216
Lead Channel Impedance Value: 304 Ohm
Lead Channel Impedance Value: 323 Ohm
Lead Channel Impedance Value: 399 Ohm
Lead Channel Impedance Value: 437 Ohm
Lead Channel Pacing Threshold Amplitude: 0.625 V
Lead Channel Pacing Threshold Amplitude: 1.125 V
Lead Channel Pacing Threshold Pulse Width: 0.4 ms
Lead Channel Pacing Threshold Pulse Width: 0.4 ms
Lead Channel Sensing Intrinsic Amplitude: 1.5 mV
Lead Channel Sensing Intrinsic Amplitude: 1.5 mV
Lead Channel Sensing Intrinsic Amplitude: 1.625 mV
Lead Channel Sensing Intrinsic Amplitude: 1.625 mV
Lead Channel Setting Pacing Amplitude: 1.75 V
Lead Channel Setting Pacing Amplitude: 2.5 V
Lead Channel Setting Pacing Pulse Width: 0.4 ms
Lead Channel Setting Sensing Sensitivity: 1.2 mV

## 2020-04-01 DIAGNOSIS — Z85828 Personal history of other malignant neoplasm of skin: Secondary | ICD-10-CM | POA: Diagnosis not present

## 2020-04-01 DIAGNOSIS — L570 Actinic keratosis: Secondary | ICD-10-CM | POA: Diagnosis not present

## 2020-04-04 NOTE — Progress Notes (Signed)
Remote pacemaker transmission.   

## 2020-04-13 ENCOUNTER — Other Ambulatory Visit: Payer: Self-pay

## 2020-04-13 ENCOUNTER — Ambulatory Visit: Payer: Medicare HMO | Admitting: Podiatry

## 2020-04-13 ENCOUNTER — Encounter: Payer: Self-pay | Admitting: Podiatry

## 2020-04-13 DIAGNOSIS — M79675 Pain in left toe(s): Secondary | ICD-10-CM

## 2020-04-13 DIAGNOSIS — B351 Tinea unguium: Secondary | ICD-10-CM

## 2020-04-13 DIAGNOSIS — D689 Coagulation defect, unspecified: Secondary | ICD-10-CM

## 2020-04-13 DIAGNOSIS — E1142 Type 2 diabetes mellitus with diabetic polyneuropathy: Secondary | ICD-10-CM | POA: Diagnosis not present

## 2020-04-13 DIAGNOSIS — M79674 Pain in right toe(s): Secondary | ICD-10-CM | POA: Diagnosis not present

## 2020-04-13 NOTE — Progress Notes (Signed)
This patient returns to my office for at risk foot care.  This patient requires this care by a professional since this patient will be at risk due to having diabetic neuropathy,and coagulation defect.  Patient is taking eliquis.  Patient says his second toenail right foot has turned black but is causing no pain.  Patient presents to the office with his wife.  This patient is unable to cut nails himself since the patient cannot reach his nails.These nails are painful walking and wearing shoes.  This patient presents for at risk foot care today.  General Appearance  Alert, conversant and in no acute stress.  Vascular  Dorsalis pedis and posterior tibial  pulses are palpable  Left foot . Absent dorsalis pedis and posterior tibial pulses right foot.  Capillary return is within normal limits  bilaterally. Temperature is within normal limits  Bilaterally.  Venous stasis  B/L.  Neurologic  Senn-Weinstein monofilament wire test diminished  bilaterally. Muscle power within normal limits bilaterally.  Nails Thick disfigured discolored nails with subungual debris  from hallux to fifth toes bilaterally. No evidence of bacterial infection or drainage bilaterally.  Orthopedic  No limitations of motion  feet .  No crepitus or effusions noted.  Hallux malleus IPJ  B/L.  Hammer toes 2  B/L.  Midfoot DJD  B/L.  HAV  B/L.    Skin  normotropic skin with no porokeratosis noted bilaterally.  No signs of infections or ulcers noted.     Onychomycosis  Pain in right toes  Pain in left toes  Consent was obtained for treatment procedures.   Mechanical debridement of nails 1-5  bilaterally performed with a nail nipper.  Filed with dremel without incident. Visual inspection was performed.   Return office visit    3 months                  Told patient to return for periodic foot care and evaluation due to potential at risk complications.   Gardiner Barefoot DPM

## 2020-04-25 ENCOUNTER — Other Ambulatory Visit (HOSPITAL_COMMUNITY): Payer: Self-pay | Admitting: Cardiology

## 2020-04-29 ENCOUNTER — Other Ambulatory Visit: Payer: Self-pay

## 2020-04-29 ENCOUNTER — Ambulatory Visit (HOSPITAL_COMMUNITY)
Admission: RE | Admit: 2020-04-29 | Discharge: 2020-04-29 | Disposition: A | Discharge status: Home / Self Care | Payer: Medicare HMO | Source: Ambulatory Visit | Attending: Cardiology | Admitting: Cardiology

## 2020-04-29 VITALS — BP 132/78 | HR 80 | Ht 73.0 in | Wt 255.0 lb

## 2020-04-29 DIAGNOSIS — I480 Paroxysmal atrial fibrillation: Secondary | ICD-10-CM | POA: Diagnosis not present

## 2020-04-29 DIAGNOSIS — I5032 Chronic diastolic (congestive) heart failure: Secondary | ICD-10-CM | POA: Insufficient documentation

## 2020-04-29 DIAGNOSIS — I951 Orthostatic hypotension: Secondary | ICD-10-CM | POA: Insufficient documentation

## 2020-04-29 DIAGNOSIS — M4800 Spinal stenosis, site unspecified: Secondary | ICD-10-CM | POA: Insufficient documentation

## 2020-04-29 DIAGNOSIS — E785 Hyperlipidemia, unspecified: Secondary | ICD-10-CM | POA: Diagnosis not present

## 2020-04-29 DIAGNOSIS — I130 Hypertensive heart and chronic kidney disease with heart failure and stage 1 through stage 4 chronic kidney disease, or unspecified chronic kidney disease: Secondary | ICD-10-CM | POA: Insufficient documentation

## 2020-04-29 DIAGNOSIS — Z87891 Personal history of nicotine dependence: Secondary | ICD-10-CM | POA: Insufficient documentation

## 2020-04-29 DIAGNOSIS — Z79899 Other long term (current) drug therapy: Secondary | ICD-10-CM | POA: Insufficient documentation

## 2020-04-29 DIAGNOSIS — Z950 Presence of cardiac pacemaker: Secondary | ICD-10-CM | POA: Diagnosis not present

## 2020-04-29 DIAGNOSIS — E1122 Type 2 diabetes mellitus with diabetic chronic kidney disease: Secondary | ICD-10-CM | POA: Diagnosis not present

## 2020-04-29 DIAGNOSIS — I495 Sick sinus syndrome: Secondary | ICD-10-CM | POA: Insufficient documentation

## 2020-04-29 DIAGNOSIS — I4819 Other persistent atrial fibrillation: Secondary | ICD-10-CM | POA: Insufficient documentation

## 2020-04-29 DIAGNOSIS — Z7901 Long term (current) use of anticoagulants: Secondary | ICD-10-CM | POA: Insufficient documentation

## 2020-04-29 DIAGNOSIS — Z8616 Personal history of COVID-19: Secondary | ICD-10-CM | POA: Diagnosis not present

## 2020-04-29 DIAGNOSIS — N183 Chronic kidney disease, stage 3 unspecified: Secondary | ICD-10-CM | POA: Insufficient documentation

## 2020-04-29 LAB — COMPREHENSIVE METABOLIC PANEL
ALT: 16 U/L (ref 0–44)
AST: 20 U/L (ref 15–41)
Albumin: 3.9 g/dL (ref 3.5–5.0)
Alkaline Phosphatase: 69 U/L (ref 38–126)
Anion gap: 11 (ref 5–15)
BUN: 16 mg/dL (ref 8–23)
CO2: 21 mmol/L — ABNORMAL LOW (ref 22–32)
Calcium: 9 mg/dL (ref 8.9–10.3)
Chloride: 100 mmol/L (ref 98–111)
Creatinine, Ser: 1.37 mg/dL — ABNORMAL HIGH (ref 0.61–1.24)
GFR calc Af Amer: 52 mL/min — ABNORMAL LOW (ref >60)
GFR calc non Af Amer: 44 mL/min — ABNORMAL LOW (ref >60)
Glucose, Bld: 120 mg/dL — ABNORMAL HIGH (ref 70–99)
Potassium: 4.1 mmol/L (ref 3.5–5.1)
Sodium: 132 mmol/L — ABNORMAL LOW (ref 135–145)
Total Bilirubin: 1.4 mg/dL — ABNORMAL HIGH (ref 0.3–1.2)
Total Protein: 6.4 g/dL — ABNORMAL LOW (ref 6.5–8.1)

## 2020-04-29 LAB — TSH: TSH: 3.185 u[IU]/mL (ref 0.350–4.500)

## 2020-04-29 LAB — VITAMIN B12: Vitamin B-12: 280 pg/mL (ref 180–914)

## 2020-04-29 MED ORDER — MIDODRINE HCL 10 MG PO TABS: 5.0000 mg | ORAL_TABLET | Freq: Three times a day (TID) | ORAL | 3 refills | Status: DC

## 2020-04-29 MED ORDER — ROSUVASTATIN CALCIUM 10 MG PO TABS: 10.0000 mg | ORAL_TABLET | Freq: Every day | ORAL | 3 refills | Status: DC

## 2020-04-29 NOTE — Patient Instructions (Signed)
It was great to see you today! No medication changes are needed at this time.  Labs today We will only contact you if something comes back abnormal or we need to make some changes. Otherwise no news is good news!  You have been ordered a PYP Scan.  This is done in the Radiology Department of [facility].  When you come for this test please plan to be there 2-3 hours. -they will call with appointment once approved with insurance  Your physician recommends that you schedule a follow-up appointment in: 6 months with Dr Aundra Dubin  If you have any questions or concerns before your next appointment please send [place] a message through [place] or call our office at [phone].    TO LEAVE A MESSAGE FOR THE NURSE SELECT OPTION 2, PLEASE LEAVE A MESSAGE INCLUDING: . YOUR NAME . DATE OF BIRTH . CALL BACK NUMBER . REASON FOR CALL**this is important as we prioritize the call backs  YOU WILL RECEIVE A CALL BACK THE SAME DAY AS LONG AS YOU CALL BEFORE 4:00 PM

## 2020-04-30 NOTE — Progress Notes (Signed)
ID:  Dylan [name], DOB [birth year], MRN [id number]   Provider location: [place] [facility] Type of Visit: Established patient  PCP:  Haywood Pao, MD  Cardiologist:  Loralie Champagne, MD   History of Present Illness: Dylan [name] is a 86 y.o. with history of DM and HTN as well as atrial fibrillation and CHF.   Patient was doing well until 11/18 when he developed exertional dyspnea.  He went to the ER 07/14/17 and was found to be in atrial fibrillation with volume overload.  He was admitted overnight, diuresed and started on Eliquis, and discharged.  He remained in atrial fibrillation and short of breath.   He was re-hospitalized with dyspnea in 12/18.  TEE-guided DCCV to NSR was done and he was diuresed. TEE showed EF 50%.    He was noted to be back in atrial fibrillation and was started on amiodarone.  TEE-guided DCCV in 1/19 was done, with conversion back to NSR.   He was admitted in 2/19 with syncope and found to have tachy-brady syndrome with junctional bradycardia (symptomatic). Medtronic dual chamber PPM was placed.   He was readmitted in 2/19 with pneumonia.  During this admission, he was noted to be back in atrial fibrillation but converted back to NSR.   In 12/20, he developed COVID-19 pneumonitis and was hospitalized at [facility].  He developed orthostatic hypotension after COVID-19.  He was started on midodrine.   He returns for followup of atrial fibrillation and CHF.  Main complaints today are neck pain and low back pain, chronic.  He uses a walker because of back pain.  He goes to the [facility] to exercise 3 days a week and has a trainer at his house another 2 days.  No significant exertional dyspnea, but legs remain very weak.  He has noted this post-COVID.  He has tingling and pain in his hands and feet.  He has a history of carpal tunnel syndrome with surgery on right.  Still occasional lightheadedness with standing but not often.   Labs  (12/20): hgb 12.9, TSH normal, BNP 72, LFTs normal, K 3.9, creatinine 0.88 Labs (2/21): TSH normal, hgb 13, LDL 52, K 3.8, creatinine 1.03, LFTs normal Labs (6/21): hgb 13.2, TSH normal, K 4, creatinine 1.19, LFTs normal  ECG (personally reviewed): a-paced  PMH: 1. Chronic diastolic CHF: Echo (97/67) with EF 50-55%, mild-moderate LVH, mild AI, mild MR, normal RV size and systolic function, PASP 45 mmHg.  - TEE (12/18): EF 50%, normal RV size and systolic function, mild MR.  - TEE (1/19): EF 55-60%, mild LVH, mild MR, normal RV, aortic sclerosis without significant stenosis.  2. Atrial fibrillation: Persistent, initially diagnosed in 11/18.  - TEE-guided DCCV in 12/18.  - TEE-guided DCCV in 1/19 on amiodarone.  3. Chest pain: LHC remotely without obstructive disease.  1/16 Cardiolite with no ischemia.  4. Type II diabetes 5. HTN 6. CKD: Stage 3.  7. Tachy-brady syndrome: Medtronic dual chamber.   8. Orthostatic hypotension.  9. COVID-19 pneumonitis 10. ABIs (6/19): Normal 11. Spinal stenosis  Social History   Socioeconomic History  . Marital status: Married    Spouse name: Not on file  . Number of children: Not on file  . Years of education: Not on file  . Highest education level: Not on file  Occupational History  . Not on file  Tobacco Use  . Smoking status: Former Smoker    Packs/day: 1.00    Types: Cigarettes  Quit date: 01/07/1992    Years since quitting: 28.3  . Smokeless tobacco: Never Used  Substance and Sexual Activity  . Alcohol use: Not Currently    Alcohol/week: 2.0 standard drinks    Types: 2 Glasses of wine per week  . Drug use: No  . Sexual activity: Yes  Other Topics Concern  . Not on file  Social History Narrative  . Not on file   Social Determinants of Health   Financial Resource Strain:   . Difficulty of Paying Living Expenses: Not on file  Food Insecurity:   . Worried About Charity fundraiser in the Last Year: Not on file  . Ran Out of  Food in the Last Year: Not on file  Transportation Needs:   . Lack of Transportation (Medical): Not on file  . Lack of Transportation (Non-Medical): Not on file  Physical Activity:   . Days of Exercise per Week: Not on file  . Minutes of Exercise per Session: Not on file  Stress:   . Feeling of Stress : Not on file  Social Connections:   . Frequency of Communication with Friends and Family: Not on file  . Frequency of Social Gatherings with Friends and Family: Not on file  . Attends Religious Services: Not on file  . Active Member of Clubs or Organizations: Not on file  . Attends Archivist Meetings: Not on file  . Marital Status: Not on file  Intimate Partner Violence:   . Fear of Current or Ex-Partner: Not on file  . Emotionally Abused: Not on file  . Physically Abused: Not on file  . Sexually Abused: Not on file   Family History  Problem Relation Age of Onset  . Cancer - Other Mother   . CVA Father   . Heart attack Brother   . Heart attack Maternal Grandmother    ROS: All systems reviewed and negative except as per HPI.  Current Outpatient Medications  Medication Sig Dispense Refill  . acetaminophen (TYLENOL) 325 MG tablet Take 650 mg by mouth every 6 (six) hours as needed for mild pain or headache.     Marland Kitchen amiodarone (PACERONE) 200 MG tablet Take one tablet - 200mg  by mouth Monday - Friday 90 tablet 1  . Cholecalciferol (VITAMIN D) 2000 units CAPS Take 2,000 Units by mouth daily.     Marland Kitchen ELIQUIS 5 MG TABS tablet Take 1 tablet by mouth twice daily 60 tablet 0  . furosemide (LASIX) 20 MG tablet Take 1 tablet (20 mg total) by mouth daily. 30 tablet 3  . midodrine (PROAMATINE) 10 MG tablet Take 0.5 tablets (5 mg total) by mouth 3 (three) times daily with meals. 270 tablet 3  . Omega-3 Fatty Acids (FISH OIL) 1200 MG CAPS Take 1,200 mg by mouth daily.     . rosuvastatin (CRESTOR) 10 MG tablet Take 1 tablet (10 mg total) by mouth daily. 90 tablet 3  . VITAMIN E PO Take 1  tablet by mouth daily.     No current facility-administered medications for this encounter.   BP 132/78   Pulse 80   Ht 6\' 1"  (1.854 m)   Wt 115.7 kg (255 lb)   SpO2 97%   BMI 33.64 kg/m  General: NAD Neck: No JVD, no thyromegaly or thyroid nodule.  Lungs: Clear to auscultation bilaterally with normal respiratory effort. CV: Nondisplaced PMI.  Heart regular S1/S2, no S3/S4, no murmur.  No peripheral edema.  No carotid bruit.  Normal pedal  pulses.  Abdomen: Soft, nontender, no hepatosplenomegaly, no distention.  Skin: Intact without lesions or rashes.  Neurologic: Alert and oriented x 3.  Psych: Normal affect. Extremities: No clubbing or cyanosis.  HEENT: Normal.   Assessment/Plan: 1. Atrial fibrillation: DCCV in 12/18.  Recurrence of atrial fibrillation with initiation of amiodarone then TEE-guided DCCV in 1/19.  He has not felt palpitations. He is in NSR today.  - Continue amiodarone 200 mg daily.  Check LFTs and TSH today, will need regular eye exam.    - Continue apixaban.  2. CKD: BMET today.   3. Chronic diastolic CHF: EF 41-74% from 1/19 TEE.  He is not volume overloaded on exam, NYHA class I-II symptoms.   - Continue Lasix 20 mg daily.  BMET today.  - With peripheral neuropathy, orthostatic hypotension (?autonomic neuropathy), atrial fibrillation, and diastolic CHF, I think he is at risk for amyloidosis.  I will arrange for PYP scan and will also arrange for myeloma panel and urine immunofixation.  4. Orthostatic hypotension: This has been present since his COVID-19 infection and may be related.  This seems to be improving though he remains on midodrine. Main issue now seems leg weakness due to spinal stenosis rather than orthostatic hypotension.  - For now, continue midodrine 5 mg tid.   - Continue to wear compression stockings during the day.  5. Hyperlipidemia: Good lipids in 2/21.   Followup in 6 months.   Signed, Loralie Champagne, MD  04/30/2020  [place] [address] Heart and [place] [address] [phone] (office) [phone] (fax)

## 2020-05-02 LAB — IMMUNOFIXATION, URINE

## 2020-05-03 LAB — MULTIPLE MYELOMA PANEL, SERUM
Albumin SerPl Elph-Mcnc: 2.8 g/dL — ABNORMAL LOW (ref 2.9–4.4)
Albumin/Glob SerPl: 0.8 (ref 0.7–1.7)
Alpha 1: 0.4 g/dL (ref 0.0–0.4)
Alpha2 Glob SerPl Elph-Mcnc: 1.1 g/dL — ABNORMAL HIGH (ref 0.4–1.0)
B-Globulin SerPl Elph-Mcnc: 0.9 g/dL (ref 0.7–1.3)
Gamma Glob SerPl Elph-Mcnc: 1.3 g/dL (ref 0.4–1.8)
Globulin, Total: 3.7 g/dL (ref 2.2–3.9)
IgA: 276 mg/dL (ref 61–437)
IgG (Immunoglobin G), Serum: 1275 mg/dL (ref 603–1613)
IgM (Immunoglobulin M), Srm: 89 mg/dL (ref 15–143)
Total Protein ELP: 6.5 g/dL (ref 6.0–8.5)

## 2020-05-10 ENCOUNTER — Other Ambulatory Visit (HOSPITAL_COMMUNITY): Payer: Self-pay | Admitting: Cardiology

## 2020-05-10 MED ORDER — MIDODRINE HCL 5 MG PO TABS: 5.0000 mg | ORAL_TABLET | Freq: Three times a day (TID) | ORAL | 3 refills | Status: DC

## 2020-05-11 ENCOUNTER — Encounter (HOSPITAL_COMMUNITY)
Admission: RE | Admit: 2020-05-11 | Discharge: 2020-05-11 | Disposition: A | Discharge status: Home / Self Care | Payer: Medicare HMO | Source: Ambulatory Visit | Attending: Cardiology | Admitting: Cardiology

## 2020-05-11 ENCOUNTER — Other Ambulatory Visit: Payer: Self-pay

## 2020-05-11 DIAGNOSIS — I5032 Chronic diastolic (congestive) heart failure: Secondary | ICD-10-CM | POA: Insufficient documentation

## 2020-05-11 DIAGNOSIS — I509 Heart failure, unspecified: Secondary | ICD-10-CM | POA: Diagnosis not present

## 2020-05-11 MED ORDER — TECHNETIUM TC 99M PYROPHOSPHATE
22.0000 | Freq: Once | INTRAVENOUS | Status: AC | PRN
  Administered 2020-05-11: 22 via INTRAVENOUS
  Filled 2020-05-11: qty 22

## 2020-05-13 NOTE — Addendum Note (Signed)
Encounter addended by: Harvie Junior, CMA on: 05/13/2020 9:42 AM  Actions taken: Charge Capture section accepted

## 2020-05-25 DIAGNOSIS — Z23 Encounter for immunization: Secondary | ICD-10-CM | POA: Diagnosis not present

## 2020-05-30 ENCOUNTER — Other Ambulatory Visit (HOSPITAL_COMMUNITY): Payer: Self-pay | Admitting: Cardiology

## 2020-06-08 ENCOUNTER — Telehealth (HOSPITAL_COMMUNITY): Payer: Self-pay | Admitting: Pharmacy Technician

## 2020-06-08 NOTE — Telephone Encounter (Signed)
Spoke with patients wife regarding the patient's Eliquis co-pay. He has hit the donut hole for the year, causing the co-pay to be unaffordable. She has the patient's portion of the application. She will bring that in along with the OOP expense report.  Will fax once provider's signature is obtained and the application is brought into the office.

## 2020-06-15 NOTE — Telephone Encounter (Signed)
Sent in application via fax.  Will follow up.  

## 2020-06-20 ENCOUNTER — Other Ambulatory Visit (HOSPITAL_COMMUNITY): Payer: Self-pay | Admitting: Cardiology

## 2020-06-21 NOTE — Telephone Encounter (Signed)
Patient was denied for Eliquis assistance because he is over the income for a household of 2. Spoke with the patient's wife regarding denial.  Charlann Boxer, CPhT

## 2020-06-24 ENCOUNTER — Encounter (HOSPITAL_COMMUNITY): Payer: Self-pay | Admitting: *Deleted

## 2020-06-24 ENCOUNTER — Emergency Department (HOSPITAL_COMMUNITY)
Admission: EM | Admit: 2020-06-24 | Discharge: 2020-06-24 | Disposition: A | Discharge status: Home / Self Care | Payer: Medicare HMO | Attending: Emergency Medicine | Admitting: Emergency Medicine

## 2020-06-24 ENCOUNTER — Other Ambulatory Visit: Payer: Self-pay

## 2020-06-24 DIAGNOSIS — Z7901 Long term (current) use of anticoagulants: Secondary | ICD-10-CM | POA: Insufficient documentation

## 2020-06-24 DIAGNOSIS — I5032 Chronic diastolic (congestive) heart failure: Secondary | ICD-10-CM | POA: Diagnosis not present

## 2020-06-24 DIAGNOSIS — I110 Hypertensive heart disease with heart failure: Secondary | ICD-10-CM | POA: Insufficient documentation

## 2020-06-24 DIAGNOSIS — Z4800 Encounter for change or removal of nonsurgical wound dressing: Secondary | ICD-10-CM | POA: Insufficient documentation

## 2020-06-24 DIAGNOSIS — E119 Type 2 diabetes mellitus without complications: Secondary | ICD-10-CM | POA: Diagnosis not present

## 2020-06-24 DIAGNOSIS — Z950 Presence of cardiac pacemaker: Secondary | ICD-10-CM | POA: Diagnosis not present

## 2020-06-24 DIAGNOSIS — S91311D Laceration without foreign body, right foot, subsequent encounter: Secondary | ICD-10-CM | POA: Diagnosis not present

## 2020-06-24 DIAGNOSIS — Z5189 Encounter for other specified aftercare: Secondary | ICD-10-CM

## 2020-06-24 DIAGNOSIS — Z87891 Personal history of nicotine dependence: Secondary | ICD-10-CM | POA: Insufficient documentation

## 2020-06-24 NOTE — Discharge Instructions (Signed)
Keep wound clean and dry  Check wound throughout the day to ensure it is not worsening or becoming infected  Return for any new symptoms or concerns

## 2020-06-24 NOTE — ED Provider Notes (Addendum)
[facility] EMERGENCY DEPARTMENT Provider Note   CSN: [id number] Arrival date & time: 06/24/20  1308     History Chief Complaint  Patient presents with  . Laceration    Dylan [name] is a 86 y.o. male presents to the ER with wife for evaluation of right foot wound that occurred earlier this morning. States he noticed bleeding from right lateral foot. He does not know exactly what happens. He denies significant pain and reports chronic decreased sensation due to "neuropathy". Wife was concerned because initially she could not seem to get the bleeding to stop. She cut a maxi pad and secured it onto the area of bleeding with coban bandage.  No break through bleeding since. Patient's tetanus was within last 10 years.   HPI     Past Medical History:  Diagnosis Date  . COVID-19   . Diabetes mellitus without complication ([place])   . Dyspnea   . History of hiatal hernia   . Hyperglycemia   . Hyperlipidemia   . Hypertension   . Lung nodule   . Myocardial infarction ([place])   . Presence of permanent cardiac pacemaker     Patient Active Problem List   Diagnosis Date Noted  . Sinus node dysfunction ([place]) 01/11/2020  . Pacemaker 01/11/2020  . Pain due to onychomycosis of toenails of both feet 10/14/2019  . Diabetic neuropathy ([place]) 10/14/2019  . Type 2 diabetes mellitus with vascular disease ([place]) 10/14/2019  . Coagulation disorder ([place]) 10/14/2019  . Chronic diastolic CHF (congestive heart failure) ([place]) 07/28/2019  . Pneumonia due to COVID-19 virus 07/28/2019  . Periorbital hematoma of left eye 10/15/2017  . Fall 10/15/2017  . HCAP (healthcare-associated pneumonia) 10/14/2017  . Bradycardia 10/03/2017  . Acute CHF (congestive heart failure) ([place]) 08/03/2017  . Atrial fibrillation ([place]) 07/14/2017  . Diastolic dysfunction with acute on chronic heart failure ([place]) 07/14/2017  . DOE (dyspnea on exertion) 08/24/2014  . Benign essential HTN 08/24/2014  . Type 2  diabetes mellitus without complication ([place]) 65/78/4696  . Dyslipidemia 08/24/2014    Past Surgical History:  Procedure Laterality Date  . APPENDECTOMY  1956  . BACK SURGERY  2008  . CARDIAC CATHETERIZATION  1995   negative results  . CARDIOVERSION N/A 08/05/2017   Procedure: CARDIOVERSION;  Surgeon: Larey Dresser, MD;  Location: [facility] ENDOSCOPY;  Service: Cardiovascular;  Laterality: N/A;  . CARDIOVERSION N/A 09/12/2017   Procedure: CARDIOVERSION;  Surgeon: Larey Dresser, MD;  Location: [facility] ENDOSCOPY;  Service: Cardiovascular;  Laterality: N/A;  . COLON SURGERY  07/09/12  . [place]  . HOT HEMOSTASIS  01/08/2012   Procedure: HOT HEMOSTASIS (ARGON PLASMA COAGULATION/BICAP);  Surgeon: Arta Silence, MD;  Location: [facility] ENDOSCOPY;  Service: Endoscopy;  Laterality: N/A;  . LAPAROSCOPIC ILEOCECECTOMY  07/09/2012   Procedure: LAPAROSCOPIC ILEOCECECTOMY;  Surgeon: Edward Jolly, MD;  Location: [facility] ORS;  Service: General;  Laterality: N/A;  Laparoscopic Ileocecectomy  . PACEMAKER IMPLANT N/A 10/05/2017   Procedure: PACEMAKER IMPLANT;  Surgeon: Deboraha Sprang, MD;  Location: [facility] CV LAB;  Service: Cardiovascular;  Laterality: N/A;  . REPLACEMENT TOTAL KNEE  2010  . TEE WITHOUT CARDIOVERSION N/A 08/05/2017   Procedure: TRANSESOPHAGEAL ECHOCARDIOGRAM (TEE);  Surgeon: Larey Dresser, MD;  Location: [facility] ENDOSCOPY;  Service: Cardiovascular;  Laterality: N/A;  . TEE WITHOUT CARDIOVERSION N/A 09/12/2017   Procedure: TRANSESOPHAGEAL ECHOCARDIOGRAM (TEE);  Surgeon: Larey Dresser, MD;  Location: [facility] ENDOSCOPY;  Service: Cardiovascular;  Laterality: N/A;  Family History  Problem Relation Age of Onset  . Cancer - Other Mother   . CVA Father   . Heart attack Brother   . Heart attack Maternal Grandmother     Social History   Tobacco Use  . Smoking status: Former Smoker    Packs/day: 1.00    Types: Cigarettes    Quit date: 01/07/1992    Years since quitting: 28.4    . Smokeless tobacco: Never Used  Substance Use Topics  . Alcohol use: Not Currently    Alcohol/week: 2.0 standard drinks    Types: 2 Glasses of wine per week  . Drug use: No    Home Medications Prior to Admission medications   Medication Sig Start Date End Date Taking? Authorizing Provider  acetaminophen (TYLENOL) 325 MG tablet Take 650 mg by mouth every 6 (six) hours as needed for mild pain or headache.     [provider]  amiodarone (PACERONE) 200 MG tablet Take 1 tablet by mouth once daily 06/21/20   Larey Dresser, MD  Cholecalciferol (VITAMIN D) 2000 units CAPS Take 2,000 Units by mouth daily.     [provider]  ELIQUIS 5 MG TABS tablet Take 1 tablet by mouth twice daily 05/30/20   Larey Dresser, MD  furosemide (LASIX) 20 MG tablet Take 1 tablet (20 mg total) by mouth daily. 10/14/17   Larey Dresser, MD  midodrine (PROAMATINE) 5 MG tablet Take 1 tablet (5 mg total) by mouth 3 (three) times daily with meals. 05/10/20   Larey Dresser, MD  Omega-3 Fatty Acids (FISH OIL) 1200 MG CAPS Take 1,200 mg by mouth daily.     [provider]  rosuvastatin (CRESTOR) 10 MG tablet Take 1 tablet (10 mg total) by mouth daily. 04/29/20 07/28/20  Larey Dresser, MD  VITAMIN E PO Take 1 tablet by mouth daily.    [provider]    Allergies    Keflex [cephalexin]  Review of Systems   Review of Systems  Skin: Positive for wound.  All other systems reviewed and are negative.   Physical Exam Updated Vital Signs BP (!) 154/83 (BP Location: Left Arm)   Pulse 80   Temp 97.8 F (36.6 C) (Oral)   Resp 20   Ht 6' (1.829 m)   Wt 113.4 kg   SpO2 98%   BMI 33.91 kg/m   Physical Exam Constitutional:      Appearance: He is well-developed.  HENT:     Head: Normocephalic.     Nose: Nose normal.  Eyes:     General: Lids are normal.  Cardiovascular:     Rate and Rhythm: Normal rate.  Pulmonary:     Effort: Pulmonary effort is normal. No  respiratory distress.  Musculoskeletal:        General: Normal range of motion.     Cervical back: Normal range of motion.  Skin:    Comments: 1 cm superficial skin tear right lateral distal foot, hemostatic. No tenderness   Neurological:     Mental Status: He is alert.  Psychiatric:        Behavior: Behavior normal.     ED Results / Procedures / Treatments   Labs (all labs ordered are listed, but only abnormal results are displayed) Labs Reviewed - No data to display  EKG None  Radiology No results found.  Procedures Procedures (including critical care time)  Medications Ordered in ED Medications - No data to display  ED  Course  I have reviewed the triage vital signs and the nursing notes.  Pertinent labs & imaging results that were available during my care of the patient were reviewed by me and considered in my medical decision making (see chart for details).    MDM Rules/Calculators/A&P                          Very small and superficial skin tear, hemostatic on my exam. No need for any repair or sutures or adhesives.  RN to clean, apply dressing. Last tetanus within 10 years. Discussed wound care instructions and return precautions. Patient and wife comfortable with POC.   Final Clinical Impression(s) / ED Diagnoses Final diagnoses:  Visit for wound check    Rx / DC Orders ED Discharge Orders    None       Kinnie Feil, PA-C 06/24/20 3343    Valarie Merino, MD 06/24/20 0730

## 2020-06-24 NOTE — ED Triage Notes (Signed)
Pt says that he hit the right foot area under the pinky toe on something while walking. He has neuropathy and did not know the injury occurred. He is on Eliquis and could not get it to stop bleeding.

## 2020-06-30 ENCOUNTER — Ambulatory Visit (INDEPENDENT_AMBULATORY_CARE_PROVIDER_SITE_OTHER): Payer: Medicare HMO

## 2020-06-30 DIAGNOSIS — I442 Atrioventricular block, complete: Secondary | ICD-10-CM | POA: Diagnosis not present

## 2020-06-30 LAB — CUP PACEART REMOTE DEVICE CHECK
Battery Remaining Longevity: 124 mo
Battery Voltage: 3.01 V
Brady Statistic AP VP Percent: 6.61 %
Brady Statistic AP VS Percent: 92.85 %
Brady Statistic AS VP Percent: 0 %
Brady Statistic AS VS Percent: 0.54 %
Brady Statistic RA Percent Paced: 99.94 %
Brady Statistic RV Percent Paced: 6.61 %
Date Time Interrogation Session: 20211110194258
Implantable Lead Implant Date: 20190216
Implantable Lead Implant Date: 20190216
Implantable Lead Location: 753859
Implantable Lead Location: 753860
Implantable Lead Model: 5076
Implantable Lead Model: 5076
Implantable Pulse Generator Implant Date: 20190216
Lead Channel Impedance Value: 304 Ohm
Lead Channel Impedance Value: 304 Ohm
Lead Channel Impedance Value: 380 Ohm
Lead Channel Impedance Value: 418 Ohm
Lead Channel Pacing Threshold Amplitude: 0.625 V
Lead Channel Pacing Threshold Amplitude: 1.25 V
Lead Channel Pacing Threshold Pulse Width: 0.4 ms
Lead Channel Pacing Threshold Pulse Width: 0.4 ms
Lead Channel Sensing Intrinsic Amplitude: 1.75 mV
Lead Channel Sensing Intrinsic Amplitude: 1.75 mV
Lead Channel Sensing Intrinsic Amplitude: 2.25 mV
Lead Channel Sensing Intrinsic Amplitude: 2.25 mV
Lead Channel Setting Pacing Amplitude: 1.75 V
Lead Channel Setting Pacing Amplitude: 2.5 V
Lead Channel Setting Pacing Pulse Width: 0.4 ms
Lead Channel Setting Sensing Sensitivity: 1.2 mV

## 2020-07-04 NOTE — Progress Notes (Signed)
Remote pacemaker transmission.   

## 2020-07-05 DIAGNOSIS — Z6836 Body mass index (BMI) 36.0-36.9, adult: Secondary | ICD-10-CM | POA: Diagnosis not present

## 2020-07-05 DIAGNOSIS — Z7901 Long term (current) use of anticoagulants: Secondary | ICD-10-CM | POA: Diagnosis not present

## 2020-07-05 DIAGNOSIS — I110 Hypertensive heart disease with heart failure: Secondary | ICD-10-CM | POA: Diagnosis not present

## 2020-07-05 DIAGNOSIS — M6281 Muscle weakness (generalized): Secondary | ICD-10-CM | POA: Diagnosis not present

## 2020-07-05 DIAGNOSIS — I5032 Chronic diastolic (congestive) heart failure: Secondary | ICD-10-CM | POA: Diagnosis not present

## 2020-07-05 DIAGNOSIS — G6289 Other specified polyneuropathies: Secondary | ICD-10-CM | POA: Diagnosis not present

## 2020-07-05 DIAGNOSIS — Z950 Presence of cardiac pacemaker: Secondary | ICD-10-CM | POA: Diagnosis not present

## 2020-07-05 DIAGNOSIS — D6869 Other thrombophilia: Secondary | ICD-10-CM | POA: Diagnosis not present

## 2020-07-05 DIAGNOSIS — E1151 Type 2 diabetes mellitus with diabetic peripheral angiopathy without gangrene: Secondary | ICD-10-CM | POA: Diagnosis not present

## 2020-07-05 DIAGNOSIS — I480 Paroxysmal atrial fibrillation: Secondary | ICD-10-CM | POA: Diagnosis not present

## 2020-07-05 DIAGNOSIS — R202 Paresthesia of skin: Secondary | ICD-10-CM | POA: Diagnosis not present

## 2020-07-06 DIAGNOSIS — R69 Illness, unspecified: Secondary | ICD-10-CM | POA: Diagnosis not present

## 2020-07-20 ENCOUNTER — Ambulatory Visit: Payer: Medicare HMO | Admitting: Podiatry

## 2020-07-20 ENCOUNTER — Encounter: Payer: Self-pay | Admitting: Podiatry

## 2020-07-20 ENCOUNTER — Other Ambulatory Visit: Payer: Self-pay

## 2020-07-20 DIAGNOSIS — B351 Tinea unguium: Secondary | ICD-10-CM

## 2020-07-20 DIAGNOSIS — D689 Coagulation defect, unspecified: Secondary | ICD-10-CM | POA: Diagnosis not present

## 2020-07-20 DIAGNOSIS — E1142 Type 2 diabetes mellitus with diabetic polyneuropathy: Secondary | ICD-10-CM

## 2020-07-20 DIAGNOSIS — M79674 Pain in right toe(s): Secondary | ICD-10-CM

## 2020-07-20 DIAGNOSIS — M79675 Pain in left toe(s): Secondary | ICD-10-CM | POA: Diagnosis not present

## 2020-07-20 NOTE — Progress Notes (Signed)
This patient returns to my office for at risk foot care.  This patient requires this care by a professional since this patient will be at risk due to having diabetic neuropathy,and coagulation defect.  Patient is taking eliquis.    Patient presents to the office with his wife.  This patient is unable to cut nails himself since the patient cannot reach his nails.These nails are painful walking and wearing shoes.  This patient presents for at risk foot care today.  General Appearance  Alert, conversant and in no acute stress.  Vascular  Dorsalis pedis and posterior tibial  pulses are palpable  Left foot . Absent dorsalis pedis and posterior tibial pulses right foot.  Capillary return is within normal limits  bilaterally. Temperature is within normal limits  Bilaterally.  Venous stasis  B/L.  Neurologic  Senn-Weinstein monofilament wire test diminished  bilaterally. Muscle power within normal limits bilaterally.  Nails Thick disfigured discolored nails with subungual debris  from hallux to fifth toes bilaterally. No evidence of bacterial infection or drainage bilaterally.  Orthopedic  No limitations of motion  feet .  No crepitus or effusions noted.  Hallux malleus IPJ  B/L.  Hammer toes 2  B/L.  Midfoot DJD  B/L.  HAV  B/L.    Skin  normotropic skin with no porokeratosis noted bilaterally.  No signs of infections or ulcers noted.     Onychomycosis  Pain in right toes  Pain in left toes  Consent was obtained for treatment procedures.   Mechanical debridement of nails 1-5  bilaterally performed with a nail nipper.  Filed with dremel without incident. Visual inspection was performed.   Return office visit    3 months                  Told patient to return for periodic foot care and evaluation due to potential at risk complications.   Delmus Warwick DPM  

## 2020-08-29 DIAGNOSIS — M47816 Spondylosis without myelopathy or radiculopathy, lumbar region: Secondary | ICD-10-CM | POA: Diagnosis not present

## 2020-08-29 DIAGNOSIS — M47812 Spondylosis without myelopathy or radiculopathy, cervical region: Secondary | ICD-10-CM | POA: Diagnosis not present

## 2020-08-29 DIAGNOSIS — M542 Cervicalgia: Secondary | ICD-10-CM | POA: Diagnosis not present

## 2020-08-29 DIAGNOSIS — M545 Low back pain, unspecified: Secondary | ICD-10-CM | POA: Diagnosis not present

## 2020-09-29 ENCOUNTER — Ambulatory Visit (INDEPENDENT_AMBULATORY_CARE_PROVIDER_SITE_OTHER): Payer: Medicare HMO

## 2020-09-29 DIAGNOSIS — I442 Atrioventricular block, complete: Secondary | ICD-10-CM

## 2020-09-29 LAB — CUP PACEART REMOTE DEVICE CHECK
Battery Remaining Longevity: 120 mo
Battery Voltage: 3 V
Brady Statistic AP VP Percent: 4.44 %
Brady Statistic AP VS Percent: 95.22 %
Brady Statistic AS VP Percent: 0 %
Brady Statistic AS VS Percent: 0.34 %
Brady Statistic RA Percent Paced: 99.94 %
Brady Statistic RV Percent Paced: 4.44 %
Date Time Interrogation Session: 20220210072209
Implantable Lead Implant Date: 20190216
Implantable Lead Implant Date: 20190216
Implantable Lead Location: 753859
Implantable Lead Location: 753860
Implantable Lead Model: 5076
Implantable Lead Model: 5076
Implantable Pulse Generator Implant Date: 20190216
Lead Channel Impedance Value: 304 Ohm
Lead Channel Impedance Value: 323 Ohm
Lead Channel Impedance Value: 418 Ohm
Lead Channel Impedance Value: 418 Ohm
Lead Channel Pacing Threshold Amplitude: 0.75 V
Lead Channel Pacing Threshold Amplitude: 1.375 V
Lead Channel Pacing Threshold Pulse Width: 0.4 ms
Lead Channel Pacing Threshold Pulse Width: 0.4 ms
Lead Channel Sensing Intrinsic Amplitude: 2.5 mV
Lead Channel Sensing Intrinsic Amplitude: 2.5 mV
Lead Channel Sensing Intrinsic Amplitude: 7.25 mV
Lead Channel Sensing Intrinsic Amplitude: 7.25 mV
Lead Channel Setting Pacing Amplitude: 1.75 V
Lead Channel Setting Pacing Amplitude: 2.75 V
Lead Channel Setting Pacing Pulse Width: 0.4 ms
Lead Channel Setting Sensing Sensitivity: 1.2 mV

## 2020-10-03 ENCOUNTER — Other Ambulatory Visit: Payer: Self-pay | Admitting: Physical Medicine and Rehabilitation

## 2020-10-03 DIAGNOSIS — M47812 Spondylosis without myelopathy or radiculopathy, cervical region: Secondary | ICD-10-CM | POA: Diagnosis not present

## 2020-10-03 DIAGNOSIS — M5416 Radiculopathy, lumbar region: Secondary | ICD-10-CM

## 2020-10-03 DIAGNOSIS — M5412 Radiculopathy, cervical region: Secondary | ICD-10-CM

## 2020-10-03 DIAGNOSIS — M47816 Spondylosis without myelopathy or radiculopathy, lumbar region: Secondary | ICD-10-CM | POA: Diagnosis not present

## 2020-10-03 DIAGNOSIS — M545 Low back pain, unspecified: Secondary | ICD-10-CM | POA: Diagnosis not present

## 2020-10-05 NOTE — Progress Notes (Signed)
Remote pacemaker transmission.   

## 2020-10-08 ENCOUNTER — Ambulatory Visit
Admission: RE | Admit: 2020-10-08 | Discharge: 2020-10-08 | Disposition: A | Discharge status: Home / Self Care | Payer: Medicare HMO | Source: Ambulatory Visit | Attending: Physical Medicine and Rehabilitation | Admitting: Physical Medicine and Rehabilitation

## 2020-10-08 ENCOUNTER — Other Ambulatory Visit: Payer: Self-pay

## 2020-10-08 DIAGNOSIS — M5021 Other cervical disc displacement,  high cervical region: Secondary | ICD-10-CM | POA: Diagnosis not present

## 2020-10-08 DIAGNOSIS — M5412 Radiculopathy, cervical region: Secondary | ICD-10-CM

## 2020-10-08 DIAGNOSIS — M545 Low back pain, unspecified: Secondary | ICD-10-CM | POA: Diagnosis not present

## 2020-10-08 DIAGNOSIS — M5416 Radiculopathy, lumbar region: Secondary | ICD-10-CM

## 2020-10-08 DIAGNOSIS — M2578 Osteophyte, vertebrae: Secondary | ICD-10-CM | POA: Diagnosis not present

## 2020-10-08 DIAGNOSIS — M47812 Spondylosis without myelopathy or radiculopathy, cervical region: Secondary | ICD-10-CM | POA: Diagnosis not present

## 2020-10-08 DIAGNOSIS — M4602 Spinal enthesopathy, cervical region: Secondary | ICD-10-CM | POA: Diagnosis not present

## 2020-10-12 DIAGNOSIS — M48061 Spinal stenosis, lumbar region without neurogenic claudication: Secondary | ICD-10-CM | POA: Diagnosis not present

## 2020-10-12 DIAGNOSIS — M47816 Spondylosis without myelopathy or radiculopathy, lumbar region: Secondary | ICD-10-CM | POA: Diagnosis not present

## 2020-10-12 DIAGNOSIS — M542 Cervicalgia: Secondary | ICD-10-CM | POA: Diagnosis not present

## 2020-10-19 ENCOUNTER — Other Ambulatory Visit (HOSPITAL_COMMUNITY): Payer: Self-pay | Admitting: Cardiology

## 2020-10-21 ENCOUNTER — Encounter: Payer: Self-pay | Admitting: Podiatry

## 2020-10-21 ENCOUNTER — Other Ambulatory Visit: Payer: Self-pay

## 2020-10-21 ENCOUNTER — Ambulatory Visit (INDEPENDENT_AMBULATORY_CARE_PROVIDER_SITE_OTHER): Payer: Medicare HMO | Admitting: Podiatry

## 2020-10-21 DIAGNOSIS — B351 Tinea unguium: Secondary | ICD-10-CM | POA: Diagnosis not present

## 2020-10-21 DIAGNOSIS — M79674 Pain in right toe(s): Secondary | ICD-10-CM | POA: Diagnosis not present

## 2020-10-21 DIAGNOSIS — M79675 Pain in left toe(s): Secondary | ICD-10-CM | POA: Diagnosis not present

## 2020-10-21 DIAGNOSIS — D689 Coagulation defect, unspecified: Secondary | ICD-10-CM

## 2020-10-21 DIAGNOSIS — E1159 Type 2 diabetes mellitus with other circulatory complications: Secondary | ICD-10-CM

## 2020-10-21 DIAGNOSIS — E1142 Type 2 diabetes mellitus with diabetic polyneuropathy: Secondary | ICD-10-CM

## 2020-10-21 NOTE — Progress Notes (Signed)
This patient returns to my office for at risk foot care.  This patient requires this care by a professional since this patient will be at risk due to having diabetic neuropathy,and coagulation defect.  Patient is taking eliquis.    Patient presents to the office with his wife.  This patient is unable to cut nails himself since the patient cannot reach his nails.These nails are painful walking and wearing shoes.  This patient presents for at risk foot care today.  General Appearance  Alert, conversant and in no acute stress.  Vascular  Dorsalis pedis and posterior tibial  pulses are palpable  Left foot . Absent dorsalis pedis and posterior tibial pulses right foot.  Capillary return is within normal limits  bilaterally. Temperature is within normal limits  Bilaterally.  Venous stasis  B/L.  Neurologic  Senn-Weinstein monofilament wire test diminished  bilaterally. Muscle power within normal limits bilaterally.  Nails Thick disfigured discolored nails with subungual debris  from hallux to fifth toes bilaterally. No evidence of bacterial infection or drainage bilaterally.  Orthopedic  No limitations of motion  feet .  No crepitus or effusions noted.  Hallux malleus IPJ  B/L.  Hammer toes 2  B/L.  Midfoot DJD  B/L.  HAV  B/L.    Skin  normotropic skin with no porokeratosis noted bilaterally.  No signs of infections or ulcers noted.     Onychomycosis  Pain in right toes  Pain in left toes  Consent was obtained for treatment procedures.   Mechanical debridement of nails 1-5  bilaterally performed with a nail nipper.  Filed with dremel without incident. Visual inspection was performed.   Return office visit    3 months                  Told patient to return for periodic foot care and evaluation due to potential at risk complications.   Defne Gerling DPM  

## 2020-10-25 ENCOUNTER — Other Ambulatory Visit (HOSPITAL_COMMUNITY): Payer: Self-pay | Admitting: Cardiology

## 2020-11-02 ENCOUNTER — Telehealth (HOSPITAL_COMMUNITY): Payer: Self-pay

## 2020-11-02 NOTE — Telephone Encounter (Signed)
[facility] Medical clearance form signed and successfully faxed 10/31/20. Medical clearance form will be scanned into patients chart.

## 2020-11-09 DIAGNOSIS — M5416 Radiculopathy, lumbar region: Secondary | ICD-10-CM | POA: Diagnosis not present

## 2020-11-28 DIAGNOSIS — D692 Other nonthrombocytopenic purpura: Secondary | ICD-10-CM | POA: Diagnosis not present

## 2020-11-28 DIAGNOSIS — L821 Other seborrheic keratosis: Secondary | ICD-10-CM | POA: Diagnosis not present

## 2020-11-28 DIAGNOSIS — D485 Neoplasm of uncertain behavior of skin: Secondary | ICD-10-CM | POA: Diagnosis not present

## 2020-11-28 DIAGNOSIS — L570 Actinic keratosis: Secondary | ICD-10-CM | POA: Diagnosis not present

## 2020-11-28 DIAGNOSIS — D1801 Hemangioma of skin and subcutaneous tissue: Secondary | ICD-10-CM | POA: Diagnosis not present

## 2020-11-28 DIAGNOSIS — C44719 Basal cell carcinoma of skin of left lower limb, including hip: Secondary | ICD-10-CM | POA: Diagnosis not present

## 2020-11-28 DIAGNOSIS — Z85828 Personal history of other malignant neoplasm of skin: Secondary | ICD-10-CM | POA: Diagnosis not present

## 2020-11-28 DIAGNOSIS — L814 Other melanin hyperpigmentation: Secondary | ICD-10-CM | POA: Diagnosis not present

## 2020-11-30 ENCOUNTER — Other Ambulatory Visit (HOSPITAL_COMMUNITY): Payer: Self-pay

## 2020-11-30 MED ORDER — ELIQUIS 5 MG PO TABS: 1.0000 | ORAL_TABLET | Freq: Two times a day (BID) | ORAL | 0 refills | Status: DC

## 2020-12-01 DIAGNOSIS — M48061 Spinal stenosis, lumbar region without neurogenic claudication: Secondary | ICD-10-CM | POA: Diagnosis not present

## 2020-12-01 DIAGNOSIS — M47816 Spondylosis without myelopathy or radiculopathy, lumbar region: Secondary | ICD-10-CM | POA: Diagnosis not present

## 2020-12-07 DIAGNOSIS — C44719 Basal cell carcinoma of skin of left lower limb, including hip: Secondary | ICD-10-CM | POA: Diagnosis not present

## 2020-12-28 DIAGNOSIS — E7800 Pure hypercholesterolemia, unspecified: Secondary | ICD-10-CM | POA: Diagnosis not present

## 2020-12-28 DIAGNOSIS — E1151 Type 2 diabetes mellitus with diabetic peripheral angiopathy without gangrene: Secondary | ICD-10-CM | POA: Diagnosis not present

## 2020-12-28 DIAGNOSIS — Z125 Encounter for screening for malignant neoplasm of prostate: Secondary | ICD-10-CM | POA: Diagnosis not present

## 2020-12-29 ENCOUNTER — Ambulatory Visit (INDEPENDENT_AMBULATORY_CARE_PROVIDER_SITE_OTHER): Payer: Medicare HMO

## 2020-12-29 DIAGNOSIS — I442 Atrioventricular block, complete: Secondary | ICD-10-CM | POA: Diagnosis not present

## 2020-12-29 LAB — CUP PACEART REMOTE DEVICE CHECK
Battery Remaining Longevity: 121 mo
Battery Voltage: 3 V
Brady Statistic AP VP Percent: 3.38 %
Brady Statistic AP VS Percent: 96.21 %
Brady Statistic AS VP Percent: 0 %
Brady Statistic AS VS Percent: 0.41 %
Brady Statistic RA Percent Paced: 99.81 %
Brady Statistic RV Percent Paced: 3.39 %
Date Time Interrogation Session: 20220512090909
Implantable Lead Implant Date: 20190216
Implantable Lead Implant Date: 20190216
Implantable Lead Location: 753859
Implantable Lead Location: 753860
Implantable Lead Model: 5076
Implantable Lead Model: 5076
Implantable Pulse Generator Implant Date: 20190216
Lead Channel Impedance Value: 304 Ohm
Lead Channel Impedance Value: 304 Ohm
Lead Channel Impedance Value: 380 Ohm
Lead Channel Impedance Value: 418 Ohm
Lead Channel Pacing Threshold Amplitude: 0.75 V
Lead Channel Pacing Threshold Amplitude: 1.125 V
Lead Channel Pacing Threshold Pulse Width: 0.4 ms
Lead Channel Pacing Threshold Pulse Width: 0.4 ms
Lead Channel Sensing Intrinsic Amplitude: 1.625 mV
Lead Channel Sensing Intrinsic Amplitude: 1.625 mV
Lead Channel Sensing Intrinsic Amplitude: 2.625 mV
Lead Channel Sensing Intrinsic Amplitude: 2.625 mV
Lead Channel Setting Pacing Amplitude: 1.5 V
Lead Channel Setting Pacing Amplitude: 2.5 V
Lead Channel Setting Pacing Pulse Width: 0.4 ms
Lead Channel Setting Sensing Sensitivity: 1.2 mV

## 2020-12-30 ENCOUNTER — Other Ambulatory Visit (HOSPITAL_COMMUNITY): Payer: Self-pay | Admitting: Cardiology

## 2021-01-04 DIAGNOSIS — R82998 Other abnormal findings in urine: Secondary | ICD-10-CM | POA: Diagnosis not present

## 2021-01-04 DIAGNOSIS — Z950 Presence of cardiac pacemaker: Secondary | ICD-10-CM | POA: Diagnosis not present

## 2021-01-04 DIAGNOSIS — Z0000 Encounter for general adult medical examination without abnormal findings: Secondary | ICD-10-CM | POA: Diagnosis not present

## 2021-01-04 DIAGNOSIS — Z1331 Encounter for screening for depression: Secondary | ICD-10-CM | POA: Diagnosis not present

## 2021-01-04 DIAGNOSIS — E1151 Type 2 diabetes mellitus with diabetic peripheral angiopathy without gangrene: Secondary | ICD-10-CM | POA: Diagnosis not present

## 2021-01-04 DIAGNOSIS — E7800 Pure hypercholesterolemia, unspecified: Secondary | ICD-10-CM | POA: Diagnosis not present

## 2021-01-04 DIAGNOSIS — D6869 Other thrombophilia: Secondary | ICD-10-CM | POA: Diagnosis not present

## 2021-01-04 DIAGNOSIS — I110 Hypertensive heart disease with heart failure: Secondary | ICD-10-CM | POA: Diagnosis not present

## 2021-01-04 DIAGNOSIS — I5032 Chronic diastolic (congestive) heart failure: Secondary | ICD-10-CM | POA: Diagnosis not present

## 2021-01-04 DIAGNOSIS — Z6836 Body mass index (BMI) 36.0-36.9, adult: Secondary | ICD-10-CM | POA: Diagnosis not present

## 2021-01-04 DIAGNOSIS — I480 Paroxysmal atrial fibrillation: Secondary | ICD-10-CM | POA: Diagnosis not present

## 2021-01-04 DIAGNOSIS — Z1339 Encounter for screening examination for other mental health and behavioral disorders: Secondary | ICD-10-CM | POA: Diagnosis not present

## 2021-01-20 NOTE — Progress Notes (Signed)
Remote pacemaker transmission.   

## 2021-01-24 ENCOUNTER — Ambulatory Visit: Payer: Medicare HMO | Admitting: Podiatry

## 2021-01-27 ENCOUNTER — Ambulatory Visit: Payer: Medicare HMO | Admitting: Podiatry

## 2021-01-28 ENCOUNTER — Other Ambulatory Visit (HOSPITAL_COMMUNITY): Payer: Self-pay | Admitting: Cardiology

## 2021-02-03 ENCOUNTER — Other Ambulatory Visit: Payer: Self-pay

## 2021-02-06 ENCOUNTER — Ambulatory Visit: Payer: Medicare HMO | Admitting: Podiatry

## 2021-02-06 ENCOUNTER — Other Ambulatory Visit: Payer: Self-pay

## 2021-02-06 ENCOUNTER — Encounter: Payer: Self-pay | Admitting: Podiatry

## 2021-02-06 DIAGNOSIS — M79674 Pain in right toe(s): Secondary | ICD-10-CM

## 2021-02-06 DIAGNOSIS — B351 Tinea unguium: Secondary | ICD-10-CM

## 2021-02-06 DIAGNOSIS — Z0000 Encounter for general adult medical examination without abnormal findings: Secondary | ICD-10-CM | POA: Insufficient documentation

## 2021-02-06 DIAGNOSIS — E7800 Pure hypercholesterolemia, unspecified: Secondary | ICD-10-CM | POA: Insufficient documentation

## 2021-02-06 DIAGNOSIS — M79675 Pain in left toe(s): Secondary | ICD-10-CM

## 2021-02-06 DIAGNOSIS — D689 Coagulation defect, unspecified: Secondary | ICD-10-CM

## 2021-02-06 DIAGNOSIS — J329 Chronic sinusitis, unspecified: Secondary | ICD-10-CM | POA: Insufficient documentation

## 2021-02-06 DIAGNOSIS — R739 Hyperglycemia, unspecified: Secondary | ICD-10-CM | POA: Insufficient documentation

## 2021-02-06 DIAGNOSIS — L570 Actinic keratosis: Secondary | ICD-10-CM | POA: Insufficient documentation

## 2021-02-06 DIAGNOSIS — R059 Cough, unspecified: Secondary | ICD-10-CM | POA: Insufficient documentation

## 2021-02-06 DIAGNOSIS — E1159 Type 2 diabetes mellitus with other circulatory complications: Secondary | ICD-10-CM | POA: Diagnosis not present

## 2021-02-06 DIAGNOSIS — Z515 Encounter for palliative care: Secondary | ICD-10-CM | POA: Insufficient documentation

## 2021-02-06 DIAGNOSIS — G629 Polyneuropathy, unspecified: Secondary | ICD-10-CM | POA: Insufficient documentation

## 2021-02-06 NOTE — Progress Notes (Signed)
This patient returns to my office for at risk foot care.  This patient requires this care by a professional since this patient will be at risk due to having diabetic neuropathy,and coagulation defect.  Patient is taking eliquis.    Patient presents to the office with his wife.  This patient is unable to cut nails himself since the patient cannot reach his nails.These nails are painful walking and wearing shoes.  This patient presents for at risk foot care today.  General Appearance  Alert, conversant and in no acute stress.  Vascular  Dorsalis pedis and posterior tibial  pulses are palpable  Left foot . Absent dorsalis pedis and posterior tibial pulses right foot.  Capillary return is within normal limits  bilaterally. Temperature is within normal limits  Bilaterally.  Venous stasis  B/L.  Neurologic  Senn-Weinstein monofilament wire test diminished  bilaterally. Muscle power within normal limits bilaterally.  Nails Thick disfigured discolored nails with subungual debris  from hallux to fifth toes bilaterally. No evidence of bacterial infection or drainage bilaterally.  Orthopedic  No limitations of motion  feet .  No crepitus or effusions noted.  Hallux malleus IPJ  B/L.  Hammer toes 2  B/L.  Midfoot DJD  B/L.  HAV  B/L.    Skin  normotropic skin with no porokeratosis noted bilaterally.  No signs of infections or ulcers noted.     Onychomycosis  Pain in right toes  Pain in left toes  Consent was obtained for treatment procedures.   Mechanical debridement of nails 1-5  bilaterally performed with a nail nipper.  Filed with dremel without incident. Visual inspection was performed.   Return office visit    3 months                  Told patient to return for periodic foot care and evaluation due to potential at risk complications.   Daron Breeding DPM  

## 2021-02-16 DIAGNOSIS — I110 Hypertensive heart disease with heart failure: Secondary | ICD-10-CM | POA: Diagnosis not present

## 2021-02-16 DIAGNOSIS — I5032 Chronic diastolic (congestive) heart failure: Secondary | ICD-10-CM | POA: Diagnosis not present

## 2021-02-16 DIAGNOSIS — E7800 Pure hypercholesterolemia, unspecified: Secondary | ICD-10-CM | POA: Diagnosis not present

## 2021-02-16 DIAGNOSIS — E1151 Type 2 diabetes mellitus with diabetic peripheral angiopathy without gangrene: Secondary | ICD-10-CM | POA: Diagnosis not present

## 2021-02-22 ENCOUNTER — Telehealth: Payer: Self-pay

## 2021-02-22 NOTE — Telephone Encounter (Signed)
The patient son explain to me that the app to monitor the patient ppm was on his wife phone. His wife was in the hospital awaiting surgery and then going to rehab for a few months. I told the son to come by the office and I was going to give the patient a phone with the app so he can have by his bedside.

## 2021-02-23 ENCOUNTER — Other Ambulatory Visit (HOSPITAL_COMMUNITY): Payer: Self-pay | Admitting: Unknown Physician Specialty

## 2021-02-23 MED ORDER — ROSUVASTATIN CALCIUM 10 MG PO TABS: 10.0000 mg | ORAL_TABLET | Freq: Every day | ORAL | 3 refills | Status: DC

## 2021-03-11 ENCOUNTER — Other Ambulatory Visit (HOSPITAL_COMMUNITY): Payer: Self-pay | Admitting: Cardiology

## 2021-03-30 ENCOUNTER — Ambulatory Visit (INDEPENDENT_AMBULATORY_CARE_PROVIDER_SITE_OTHER): Payer: Medicare HMO

## 2021-03-30 DIAGNOSIS — I442 Atrioventricular block, complete: Secondary | ICD-10-CM

## 2021-03-30 LAB — CUP PACEART REMOTE DEVICE CHECK
Battery Remaining Longevity: 119 mo
Battery Voltage: 3 V
Brady Statistic AP VP Percent: 3.71 %
Brady Statistic AP VS Percent: 95.98 %
Brady Statistic AS VP Percent: 0 %
Brady Statistic AS VS Percent: 0.31 %
Brady Statistic RA Percent Paced: 99.93 %
Brady Statistic RV Percent Paced: 3.71 %
Date Time Interrogation Session: 20220810222425
Implantable Lead Implant Date: 20190216
Implantable Lead Implant Date: 20190216
Implantable Lead Location: 753859
Implantable Lead Location: 753860
Implantable Lead Model: 5076
Implantable Lead Model: 5076
Implantable Pulse Generator Implant Date: 20190216
Lead Channel Impedance Value: 304 Ohm
Lead Channel Impedance Value: 323 Ohm
Lead Channel Impedance Value: 380 Ohm
Lead Channel Impedance Value: 437 Ohm
Lead Channel Pacing Threshold Amplitude: 0.625 V
Lead Channel Pacing Threshold Amplitude: 1.25 V
Lead Channel Pacing Threshold Pulse Width: 0.4 ms
Lead Channel Pacing Threshold Pulse Width: 0.4 ms
Lead Channel Sensing Intrinsic Amplitude: 1.625 mV
Lead Channel Sensing Intrinsic Amplitude: 1.625 mV
Lead Channel Sensing Intrinsic Amplitude: 4.375 mV
Lead Channel Sensing Intrinsic Amplitude: 4.375 mV
Lead Channel Setting Pacing Amplitude: 1.5 V
Lead Channel Setting Pacing Amplitude: 2.5 V
Lead Channel Setting Pacing Pulse Width: 0.4 ms
Lead Channel Setting Sensing Sensitivity: 1.2 mV

## 2021-04-03 ENCOUNTER — Other Ambulatory Visit: Payer: Self-pay

## 2021-04-03 ENCOUNTER — Other Ambulatory Visit (HOSPITAL_COMMUNITY): Payer: Self-pay

## 2021-04-03 ENCOUNTER — Ambulatory Visit (HOSPITAL_COMMUNITY)
Admission: RE | Admit: 2021-04-03 | Discharge: 2021-04-03 | Disposition: A | Discharge status: Home / Self Care | Payer: Medicare HMO | Source: Ambulatory Visit | Attending: Cardiology | Admitting: Cardiology

## 2021-04-03 ENCOUNTER — Encounter (HOSPITAL_COMMUNITY): Payer: Self-pay | Admitting: Cardiology

## 2021-04-03 VITALS — Wt 249.6 lb

## 2021-04-03 DIAGNOSIS — E1122 Type 2 diabetes mellitus with diabetic chronic kidney disease: Secondary | ICD-10-CM | POA: Insufficient documentation

## 2021-04-03 DIAGNOSIS — Z8249 Family history of ischemic heart disease and other diseases of the circulatory system: Secondary | ICD-10-CM | POA: Insufficient documentation

## 2021-04-03 DIAGNOSIS — I5032 Chronic diastolic (congestive) heart failure: Secondary | ICD-10-CM | POA: Diagnosis not present

## 2021-04-03 DIAGNOSIS — I480 Paroxysmal atrial fibrillation: Secondary | ICD-10-CM

## 2021-04-03 DIAGNOSIS — M4800 Spinal stenosis, site unspecified: Secondary | ICD-10-CM | POA: Insufficient documentation

## 2021-04-03 DIAGNOSIS — Z7901 Long term (current) use of anticoagulants: Secondary | ICD-10-CM | POA: Insufficient documentation

## 2021-04-03 DIAGNOSIS — Z79899 Other long term (current) drug therapy: Secondary | ICD-10-CM | POA: Diagnosis not present

## 2021-04-03 DIAGNOSIS — I130 Hypertensive heart and chronic kidney disease with heart failure and stage 1 through stage 4 chronic kidney disease, or unspecified chronic kidney disease: Secondary | ICD-10-CM | POA: Insufficient documentation

## 2021-04-03 DIAGNOSIS — Z8616 Personal history of COVID-19: Secondary | ICD-10-CM | POA: Insufficient documentation

## 2021-04-03 DIAGNOSIS — I951 Orthostatic hypotension: Secondary | ICD-10-CM | POA: Diagnosis not present

## 2021-04-03 DIAGNOSIS — N183 Chronic kidney disease, stage 3 unspecified: Secondary | ICD-10-CM | POA: Diagnosis not present

## 2021-04-03 DIAGNOSIS — I4819 Other persistent atrial fibrillation: Secondary | ICD-10-CM | POA: Diagnosis not present

## 2021-04-03 LAB — CBC
HCT: 39.6 % (ref 39.0–52.0)
Hemoglobin: 13.2 g/dL (ref 13.0–17.0)
MCH: 32.8 pg (ref 26.0–34.0)
MCHC: 33.3 g/dL (ref 30.0–36.0)
MCV: 98.3 fL (ref 80.0–100.0)
Platelets: 123 10*3/uL — ABNORMAL LOW (ref 150–400)
RBC: 4.03 MIL/uL — ABNORMAL LOW (ref 4.22–5.81)
RDW: 13 % (ref 11.5–15.5)
WBC: 8.7 10*3/uL (ref 4.0–10.5)
nRBC: 0 % (ref 0.0–0.2)

## 2021-04-03 LAB — COMPREHENSIVE METABOLIC PANEL
ALT: 13 U/L (ref 0–44)
AST: 19 U/L (ref 15–41)
Albumin: 3.7 g/dL (ref 3.5–5.0)
Alkaline Phosphatase: 63 U/L (ref 38–126)
Anion gap: 6 (ref 5–15)
BUN: 24 mg/dL — ABNORMAL HIGH (ref 8–23)
CO2: 26 mmol/L (ref 22–32)
Calcium: 8.9 mg/dL (ref 8.9–10.3)
Chloride: 104 mmol/L (ref 98–111)
Creatinine, Ser: 1.22 mg/dL (ref 0.61–1.24)
GFR, Estimated: 55 mL/min — ABNORMAL LOW (ref >60)
Glucose, Bld: 84 mg/dL (ref 70–99)
Potassium: 4.5 mmol/L (ref 3.5–5.1)
Sodium: 136 mmol/L (ref 135–145)
Total Bilirubin: 1.2 mg/dL (ref 0.3–1.2)
Total Protein: 6 g/dL — ABNORMAL LOW (ref 6.5–8.1)

## 2021-04-03 LAB — BRAIN NATRIURETIC PEPTIDE: B Natriuretic Peptide: 250.8 pg/mL — ABNORMAL HIGH (ref 0.0–100.0)

## 2021-04-03 LAB — TSH: TSH: 3.176 u[IU]/mL (ref 0.350–4.500)

## 2021-04-03 MED ORDER — AMIODARONE HCL 200 MG PO TABS: 100.0000 mg | ORAL_TABLET | Freq: Every day | ORAL | 3 refills | Status: DC

## 2021-04-03 NOTE — Progress Notes (Signed)
ReDS Vest / Clip - 04/03/21 1000       ReDS Vest / Clip   Station Marker D    Ruler Value 33.5    ReDS Value Range Low volume    ReDS Actual Value 33

## 2021-04-03 NOTE — Progress Notes (Signed)
ID:  Dylan [name], DOB March 20, 1926, MRN [id number]   Provider location: [place] [facility] Type of Visit: Established patient  PCP:  Haywood Pao, MD  Cardiologist:  Loralie Champagne, MD   History of Present Illness: Dylan [name] is a 87 y.o. with history of DM and HTN as well as atrial fibrillation and CHF.    Patient was doing well until 11/18 when he developed exertional dyspnea.  He went to the ER 07/14/17 and was found to be in atrial fibrillation with volume overload.  He was admitted overnight, diuresed and started on Eliquis, and discharged.  He remained in atrial fibrillation and short of breath.   He was re-hospitalized with dyspnea in 12/18.  TEE-guided DCCV to NSR was done and he was diuresed. TEE showed EF 50%.    He was noted to be back in atrial fibrillation and was started on amiodarone.  TEE-guided DCCV in 1/19 was done, with conversion back to NSR.   He was admitted in 2/19 with syncope and found to have tachy-brady syndrome with junctional bradycardia (symptomatic). Medtronic dual chamber PPM was placed.   He was readmitted in 2/19 with pneumonia.  During this admission, he was noted to be back in atrial fibrillation but converted back to NSR.   In 12/20, he developed COVID-19 pneumonitis and was hospitalized at [facility].  He developed orthostatic hypotension after COVID-19.  He was started on midodrine.   PYP scan in 9/21 was not suggestive of TTR cardiac amyloidosis.   He returns for followup of atrial fibrillation and CHF.  Patient's wife broke her foot earlier this summer and ended up in a SNF.  She was his driver to the gym, so he has been much less active since that time.  He has not been able to go to the [facility].  He has noted increased exertional dyspnea over the last few weeks, now short of breath after walking 20-30 feet.  No chest pain. No orthopnea/PND.  He still occasionally gets lightheaded with standing, but is not  orthostatic today when measured.  He continues to have low back and neck pain, and he has ongoing numbness/tingling in his feet and hands.   REDS clip 33%  Medtronic device interrogation: no atrial fibrillation  Labs (12/20): hgb 12.9, TSH normal, BNP 72, LFTs normal, K 3.9, creatinine 0.88 Labs (2/21): TSH normal, hgb 13, LDL 52, K 3.8, creatinine 1.03, LFTs normal Labs (6/21): hgb 13.2, TSH normal, K 4, creatinine 1.19, LFTs normal Labs (9/21): Myeloma panel and urine immunofixation negative, LFTs normal, K 4.1, creatinine 1.37  ECG (personally reviewed): a-paced rhythm  PMH: 1. Chronic diastolic CHF: Echo (Q000111Q) with EF 50-55%, mild-moderate LVH, mild AI, mild MR, normal RV size and systolic function, PASP 45 mmHg.  - TEE (12/18): EF 50%, normal RV size and systolic function, mild MR.  - TEE (1/19): EF 55-60%, mild LVH, mild MR, normal RV, aortic sclerosis without significant stenosis.  - PYP scan (9/21): grade 1, H/CL 1.03 => unlikely TTR cardiac amyloidosis.  2. Atrial fibrillation: Persistent, initially diagnosed in 11/18.  - TEE-guided DCCV in 12/18.  - TEE-guided DCCV in 1/19 on amiodarone.  3. Chest pain: LHC remotely without obstructive disease.  1/16 Cardiolite with no ischemia.  4. Type II diabetes 5. HTN 6. CKD: Stage 3.  7. Tachy-brady syndrome: Medtronic dual chamber.   8. Orthostatic hypotension.  9. COVID-19 pneumonitis 10. ABIs (6/19): Normal 11. Spinal stenosis  Social History  Socioeconomic History   Marital status: Married    Spouse name: Not on file   Number of children: Not on file   Years of education: Not on file   Highest education level: Not on file  Occupational History   Not on file  Tobacco Use   Smoking status: Former    Packs/day: 1.00    Types: Cigarettes    Quit date: 01/07/1992    Years since quitting: 29.2   Smokeless tobacco: Never  Substance and Sexual Activity   Alcohol use: Not Currently    Alcohol/week: 2.0 standard drinks     Types: 2 Glasses of wine per week   Drug use: No   Sexual activity: Yes  Other Topics Concern   Not on file  Social History Narrative   Not on file   Social Determinants of Health   Financial Resource Strain: Not on file  Food Insecurity: Not on file  Transportation Needs: Not on file  Physical Activity: Not on file  Stress: Not on file  Social Connections: Not on file  Intimate Partner Violence: Not on file   Family History  Problem Relation Age of Onset   Cancer - Other Mother    CVA Father    Heart attack Brother    Heart attack Maternal Grandmother    ROS: All systems reviewed and negative except as per HPI.  Current Outpatient Medications  Medication Sig Dispense Refill   acetaminophen (TYLENOL) 325 MG tablet Take 650 mg by mouth every 6 (six) hours as needed for mild pain or headache.      amiodarone (PACERONE) 200 MG tablet Take 200 mg by mouth daily. Monday though Thursday     apixaban (ELIQUIS) 5 MG TABS tablet Take 1 tablet (5 mg total) by mouth 2 (two) times daily. Needs appt 60 tablet 2   carvedilol (COREG) 12.5 MG tablet Take 6.25 mg by mouth 2 (two) times daily with a meal.     Cholecalciferol (VITAMIN D) 2000 units CAPS Take 2,000 Units by mouth daily.      furosemide (LASIX) 20 MG tablet Take 1 tablet (20 mg total) by mouth daily. 30 tablet 3   gabapentin (NEURONTIN) 300 MG capsule take one capsule by mouth     midodrine (PROAMATINE) 5 MG tablet Take 1 tablet (5 mg total) by mouth 3 (three) times daily with meals. 270 tablet 3   Omega-3 Fatty Acids (FISH OIL) 1200 MG CAPS Take 1,200 mg by mouth daily.      rosuvastatin (CRESTOR) 10 MG tablet Take 1 tablet (10 mg total) by mouth daily. 100 tablet 3   vitamin B-12 (CYANOCOBALAMIN) 500 MCG tablet 1 tablet     No current facility-administered medications for this encounter.   Wt 113.2 kg (249 lb 9.6 oz)   SpO2 95%   BMI 33.85 kg/m  General: NAD Neck: No JVD, no thyromegaly or thyroid nodule.  Lungs: Clear  to auscultation bilaterally with normal respiratory effort. CV: Nondisplaced PMI.  Heart regular S1/S2, no S3/S4, no murmur.  1+ edema 1/2 to knees bilaterally.  No carotid bruit.  Normal pedal pulses.  Abdomen: Soft, nontender, no hepatosplenomegaly, no distention.  Skin: Intact without lesions or rashes.  Neurologic: Alert and oriented x 3.  Psych: Normal affect. Extremities: No clubbing or cyanosis.  HEENT: Normal.   Assessment/Plan: 1. Atrial fibrillation: DCCV in 12/18.  Recurrence of atrial fibrillation with initiation of amiodarone then TEE-guided DCCV in 1/19.  He has not felt palpitations. He is  in NSR today.  - He can decrease amiodarone to 100 mg daily.  Check LFTs and TSH today, will need regular eye exam.    - Continue apixaban, CBC today.  2. CKD: BMET today.   3. Chronic diastolic CHF: EF 0000000 from 1/19 TEE.  Prior workup negative for cardiac amyloidosis.  He is not volume overloaded on exam and by REDS vest, but increased dyspnea (NYHA class III symptoms).  I suspect that a lot of his symptomatology may be due to deconditioning.  - Continue Lasix 20 mg daily.  BMET today.  4. Orthostatic hypotension: This has been present since his COVID-19 infection and may be related.  He is not orthostatic today.  Main issue now seems leg weakness due to spinal stenosis rather than orthostatic hypotension.  - For now, continue midodrine 5 mg tid.   - Continue to wear compression stockings during the day.   Try to increase activity level and followup in 6 wks to reassess symptoms.   Signed, Loralie Champagne, MD  04/03/2021  [facility] [address] [facility] [place] [address] [phone] (office) [phone] (fax)

## 2021-04-03 NOTE — Progress Notes (Signed)
Medication Samples have been provided to the patient.  Drug name: Eliquis       Strength: '5mg'$         Qty: 4  LOT: [id number]  Exp.Date: 08/24  Dosing instructions: 1 tab po bid  The patient has been instructed regarding the correct time, dose, and frequency of taking this medication, including desired effects and most common side effects.   Shikara Mcauliffe R Frannie Shedrick [id number] AM 04/03/2021

## 2021-04-03 NOTE — Patient Instructions (Addendum)
RedsClip done today.  EKG done today.  Labs done today. We will contact you only if your labs are abnormal.  DECREASE Amiodarone to '100mg'$  (1/2 tablet) by mouth daily.  No other medication changes were made. Please continue all current medications as prescribed.  Your physician recommends that you schedule a follow-up appointment soon for an echo and in 6 weeks   Your physician has requested that you have an echocardiogram. Echocardiography is a painless test that uses sound waves to create images of your heart. It provides your doctor with information about the size and shape of your heart and how well your heart's chambers and valves are working. This procedure takes approximately one hour. There are no restrictions for this procedure.  If you have any questions or concerns before your next appointment please send [place] a message through [place] or call our office at [phone].    TO LEAVE A MESSAGE FOR THE NURSE SELECT OPTION 2, PLEASE LEAVE A MESSAGE INCLUDING: YOUR NAME DATE OF BIRTH CALL BACK NUMBER REASON FOR CALL**this is important as we prioritize the call backs  YOU WILL RECEIVE A CALL BACK THE SAME DAY AS LONG AS YOU CALL BEFORE 4:00 PM   Do the following things EVERYDAY: Weigh yourself in the morning before breakfast. Write it down and keep it in a log. Take your medicines as prescribed Eat low salt foods--Limit salt (sodium) to 2000 mg per day.  Stay as active as you can everyday Limit all fluids for the day to less than 2 liters   At the [facility], you and your health needs are our priority. As part of our continuing mission to provide you with exceptional heart care, we have created designated Provider Care Teams. These Care Teams include your primary Cardiologist (physician) and Advanced Practice Providers (APPs- Physician Assistants and Nurse Practitioners) who all work together to provide you with the care you need, when you need it.   You may  see any of the following providers on your designated Care Team at your next follow up: Dr Glori Bickers Dr Haynes Kerns, NP Lyda Jester, [place] Audry Riles, PharmD   Please be sure to bring in all your medications bottles to every appointment.

## 2021-04-14 ENCOUNTER — Other Ambulatory Visit (HOSPITAL_COMMUNITY): Payer: Self-pay | Admitting: Cardiology

## 2021-04-14 ENCOUNTER — Ambulatory Visit (HOSPITAL_COMMUNITY): Payer: Medicare HMO

## 2021-04-21 NOTE — Progress Notes (Signed)
Remote pacemaker transmission.   

## 2021-04-22 ENCOUNTER — Other Ambulatory Visit (HOSPITAL_COMMUNITY): Payer: Self-pay | Admitting: Cardiology

## 2021-04-25 ENCOUNTER — Ambulatory Visit (HOSPITAL_COMMUNITY)
Admission: RE | Admit: 2021-04-25 | Discharge: 2021-04-25 | Disposition: A | Discharge status: Home / Self Care | Payer: Medicare HMO | Source: Ambulatory Visit | Attending: Cardiology | Admitting: Cardiology

## 2021-04-25 ENCOUNTER — Other Ambulatory Visit: Payer: Self-pay

## 2021-04-25 DIAGNOSIS — I351 Nonrheumatic aortic (valve) insufficiency: Secondary | ICD-10-CM | POA: Diagnosis not present

## 2021-04-25 DIAGNOSIS — I5032 Chronic diastolic (congestive) heart failure: Secondary | ICD-10-CM | POA: Diagnosis not present

## 2021-04-25 LAB — ECHOCARDIOGRAM COMPLETE
AR max vel: 7.92 cm2
AV Area VTI: 7.43 cm2
AV Area mean vel: 7.53 cm2
AV Mean grad: 4 mmHg
AV Peak grad: 6.2 mmHg
Ao pk vel: 1.25 m/s
Area-P 1/2: 4 cm2
P 1/2 time: 392 msec
S' Lateral: 2.8 cm
Single Plane A4C EF: 63.1 %

## 2021-05-06 ENCOUNTER — Other Ambulatory Visit (HOSPITAL_COMMUNITY): Payer: Self-pay | Admitting: Cardiology

## 2021-05-10 ENCOUNTER — Ambulatory Visit: Payer: Medicare HMO | Admitting: Podiatry

## 2021-05-22 ENCOUNTER — Encounter: Payer: Self-pay | Admitting: Internal Medicine

## 2021-05-22 ENCOUNTER — Other Ambulatory Visit: Payer: Self-pay

## 2021-05-22 ENCOUNTER — Ambulatory Visit: Payer: Medicare HMO | Admitting: Internal Medicine

## 2021-05-22 VITALS — BP 120/60 | HR 76 | Ht 72.0 in | Wt 245.8 lb

## 2021-05-22 DIAGNOSIS — I495 Sick sinus syndrome: Secondary | ICD-10-CM

## 2021-05-22 DIAGNOSIS — I4891 Unspecified atrial fibrillation: Secondary | ICD-10-CM

## 2021-05-22 DIAGNOSIS — Z950 Presence of cardiac pacemaker: Secondary | ICD-10-CM | POA: Diagnosis not present

## 2021-05-22 NOTE — Progress Notes (Signed)
Patient Care Team: Tisovec, Fransico Him, MD as PCP - General (Internal Medicine) Larey Dresser, MD as PCP - Cardiology (Cardiology)   HPI  Dylan [name] is a 87 y.o. male Seen in follow-up for pacemaker implantation for tachybradycardia and presyncope and underwent Medtronic pacemaker implantation 2019.  Treated with amiodarone and Eliquis    Also significant orthostatic hypotension treated with ProAmatine   Overall he is feeling pretty good. He reports no issues concerning palpitations.  Lately, he has some dizziness but not as much as prior. Usually his dizziness occurs when standing up to walk to the restroom. It is not common for this to occur after eating or after getting out of bed in the morning. He uses a chair in the shower due to back pain, and has no dizziness during or after his showers. He would be more fearful of falling due to LE weakness (not due to dizziness).  For exercise he works out twice a week with a trainer, including bench presses and squats. He used to go to the gym 3 times a week with his wife before she suffered an injury.   The patient denies chest pain, shortness of breath, nocturnal dyspnea, orthopnea or peripheral edema.  There has been no syncope.    Currently he is taking amiodarone Monday through Thursday.  Patient denies symptoms of GI intolerance, sun sensitivity, neurological symptoms attributable to amiodarone.     DATE TEST EF   1/19 TEE  55-60 %   9/22 Echo  55-60 %    Date Cr K Hgb TSH LFTs  3/19 1.29 3.7 13.5 3.36 22  2/21 1.03 3.8 13.0 2.966 17  8/22 1.22 4.5 13.2 3.176 13     Past Medical History:  Diagnosis Date   COVID-19    Diabetes mellitus without complication ([place])    Dyspnea    History of hiatal hernia    Hyperglycemia    Hyperlipidemia    Hypertension    Lung nodule    Myocardial infarction ([place])    Presence of permanent cardiac pacemaker     Past Surgical History:  Procedure Laterality Date    APPENDECTOMY  1956   BACK SURGERY  2008   CARDIAC CATHETERIZATION  1995   negative results   CARDIOVERSION N/A 08/05/2017   Procedure: CARDIOVERSION;  Surgeon: Larey Dresser, MD;  Location: [facility];  Service: Cardiovascular;  Laterality: N/A;   CARDIOVERSION N/A 09/12/2017   Procedure: CARDIOVERSION;  Surgeon: Larey Dresser, MD;  Location: [facility];  Service: Cardiovascular;  Laterality: N/A;   COLON SURGERY  07/09/12   [place] HEMOSTASIS  01/08/2012   Procedure: HOT HEMOSTASIS (ARGON PLASMA COAGULATION/BICAP);  Surgeon: Arta Silence, MD;  Location: [facility];  Service: Endoscopy;  Laterality: N/A;   LAPAROSCOPIC ILEOCECECTOMY  07/09/2012   Procedure: LAPAROSCOPIC ILEOCECECTOMY;  Surgeon: Edward Jolly, MD;  Location: [facility];  Service: General;  Laterality: N/A;  Laparoscopic Ileocecectomy   PACEMAKER IMPLANT N/A 10/05/2017   Procedure: PACEMAKER IMPLANT;  Surgeon: Deboraha Sprang, MD;  Location: [facility];  Service: Cardiovascular;  Laterality: N/A;   REPLACEMENT TOTAL KNEE  2010   TEE WITHOUT CARDIOVERSION N/A 08/05/2017   Procedure: TRANSESOPHAGEAL ECHOCARDIOGRAM (TEE);  Surgeon: Larey Dresser, MD;  Location: [facility] ENDOSCOPY;  Service: Cardiovascular;  Laterality: N/A;   TEE WITHOUT CARDIOVERSION N/A 09/12/2017   Procedure: TRANSESOPHAGEAL ECHOCARDIOGRAM (TEE);  Surgeon: Larey Dresser, MD;  Location: [facility]  ENDOSCOPY;  Service: Cardiovascular;  Laterality: N/A;    Current Meds  Medication Sig   acetaminophen (TYLENOL) 325 MG tablet Take 650 mg by mouth every 6 (six) hours as needed for mild pain or headache.    amiodarone (PACERONE) 200 MG tablet Take 0.5 tablets (100 mg total) by mouth daily. Monday though Thursday   Cholecalciferol (VITAMIN D) 2000 units CAPS Take 2,000 Units by mouth daily.    ELIQUIS 5 MG TABS tablet TAKE 1 TABLET BY MOUTH TWICE DAILY . APPOINTMENT REQUIRED FOR FUTURE REFILLS   furosemide (LASIX) 20 MG tablet Take 1  tablet (20 mg total) by mouth daily.   midodrine (PROAMATINE) 5 MG tablet Take 1 tablet (5 mg total) by mouth 3 (three) times daily.   Omega-3 Fatty Acids (FISH OIL) 1200 MG CAPS Take 1,200 mg by mouth daily.    rosuvastatin (CRESTOR) 10 MG tablet Take 1 tablet by mouth once daily   vitamin B-12 (CYANOCOBALAMIN) 500 MCG tablet 1 tablet    Allergies  Allergen Reactions   Keflex [Cephalexin] Hives and Other (See Comments)    Occurred in the 1980's      Review of Systems negative except from HPI and PMH  Physical Exam BP 120/60   Pulse 76   Ht 6' (1.829 m)   Wt 245 lb 12.8 oz (111.5 kg)   SpO2 94%   BMI 33.34 kg/m  Well developed and well nourished in no acute distress HENT normal Neck supple with JVP-flat Clear Device pocket well healed; without hematoma or erythema.  There is no tethering  Regular rate and rhythm, no  gallop No  murmur Abd-soft with active BS No Clubbing cyanosis 1+  edema Skin-warm and dry A & Oriented  Grossly normal sensory and motor function  ECG sinus with P synchronous pacing Intervals 24/15/46     Assessment and  Plan Sinus node dysfunction  Orthostatic intolerance  Pacemaker-Medtronic   First-degree AV block  Atrial fibrillation  Hypertension  High risk medication surveillance  His amiodarone dose has been further reduced by Dr. DM from 1000 milligrams a week--400 mg a week.  There has been no atrial fibrillation.  With his orthostasis I would be inclined to discontinue it altogether to remove it from the equation that there has been no interval atrial fibrillation.  We will continue him on Eliquis 5 mg twice daily; no bleeding issues  Lengthy discussion regarding management of orthostasis.  I suggested that he take the ProAmatine upon arising at 7 AM and then 11 and at 3 to try to keep the intervals less than about 4 hours.  We also discussed the role of an abdominal binder; he has a back brace at home and we will try that.  We  reviewed the value of isometric contraction prior to standing.  In terms of leg strength and exercise, have suggested they consider a CUBII              Current medicines are reviewed at length with the patient today .  The patient does not  have concerns regarding medicines.  I,Mathew Stumpf,acting as a scribe for Virl Axe, MD.,have documented all relevant documentation on the behalf of Virl Axe, MD,as directed by  Virl Axe, MD while in the presence of Virl Axe, MD.  I, Virl Axe, MD, have reviewed all documentation for this visit. The documentation on 05/22/21 for the exam, diagnosis, procedures, and orders are all accurate and complete.

## 2021-05-22 NOTE — Patient Instructions (Signed)

## 2021-05-24 ENCOUNTER — Encounter: Payer: Self-pay | Admitting: Podiatry

## 2021-05-24 ENCOUNTER — Ambulatory Visit (INDEPENDENT_AMBULATORY_CARE_PROVIDER_SITE_OTHER): Payer: Medicare HMO | Admitting: Podiatry

## 2021-05-24 ENCOUNTER — Other Ambulatory Visit: Payer: Self-pay

## 2021-05-24 DIAGNOSIS — M79675 Pain in left toe(s): Secondary | ICD-10-CM

## 2021-05-24 DIAGNOSIS — E1159 Type 2 diabetes mellitus with other circulatory complications: Secondary | ICD-10-CM

## 2021-05-24 DIAGNOSIS — E1142 Type 2 diabetes mellitus with diabetic polyneuropathy: Secondary | ICD-10-CM | POA: Diagnosis not present

## 2021-05-24 DIAGNOSIS — B351 Tinea unguium: Secondary | ICD-10-CM | POA: Diagnosis not present

## 2021-05-24 DIAGNOSIS — M79674 Pain in right toe(s): Secondary | ICD-10-CM | POA: Diagnosis not present

## 2021-05-24 DIAGNOSIS — D689 Coagulation defect, unspecified: Secondary | ICD-10-CM

## 2021-05-24 NOTE — Progress Notes (Signed)
This patient returns to my office for at risk foot care.  This patient requires this care by a professional since this patient will be at risk due to having diabetic neuropathy,and coagulation defect.  Patient is taking eliquis.    Patient presents to the office with his wife.  This patient is unable to cut nails himself since the patient cannot reach his nails.These nails are painful walking and wearing shoes.  This patient presents for at risk foot care today.  General Appearance  Alert, conversant and in no acute stress.  Vascular  Dorsalis pedis and posterior tibial  pulses are palpable  Left foot . Absent dorsalis pedis and posterior tibial pulses right foot.  Capillary return is within normal limits  bilaterally. Temperature is within normal limits  Bilaterally.  Venous stasis  B/L.  Neurologic  Senn-Weinstein monofilament wire test diminished  bilaterally. Muscle power within normal limits bilaterally.  Nails Thick disfigured discolored nails with subungual debris  from hallux to fifth toes bilaterally. No evidence of bacterial infection or drainage bilaterally.  Orthopedic  No limitations of motion  feet .  No crepitus or effusions noted.  Hallux malleus IPJ  B/L.  Hammer toes 2  B/L.  Midfoot DJD  B/L.  HAV  B/L.    Skin  normotropic skin with no porokeratosis noted bilaterally.  No signs of infections or ulcers noted.     Onychomycosis  Pain in right toes  Pain in left toes  Consent was obtained for treatment procedures.   Mechanical debridement of nails 1-5  bilaterally performed with a nail nipper.  Filed with dremel without incident. Visual inspection was performed.   Return office visit    3 months                  Told patient to return for periodic foot care and evaluation due to potential at risk complications.   Gust Eugene DPM  

## 2021-05-25 ENCOUNTER — Encounter (HOSPITAL_COMMUNITY): Payer: Self-pay | Admitting: Cardiology

## 2021-05-25 ENCOUNTER — Ambulatory Visit (HOSPITAL_COMMUNITY)
Admission: RE | Admit: 2021-05-25 | Discharge: 2021-05-25 | Disposition: A | Discharge status: Home / Self Care | Payer: Medicare HMO | Source: Ambulatory Visit | Attending: Cardiology | Admitting: Cardiology

## 2021-05-25 VITALS — BP 130/70 | HR 73 | Wt 246.0 lb

## 2021-05-25 DIAGNOSIS — I4819 Other persistent atrial fibrillation: Secondary | ICD-10-CM | POA: Insufficient documentation

## 2021-05-25 DIAGNOSIS — Z8616 Personal history of COVID-19: Secondary | ICD-10-CM | POA: Insufficient documentation

## 2021-05-25 DIAGNOSIS — Z7901 Long term (current) use of anticoagulants: Secondary | ICD-10-CM | POA: Insufficient documentation

## 2021-05-25 DIAGNOSIS — Z8249 Family history of ischemic heart disease and other diseases of the circulatory system: Secondary | ICD-10-CM | POA: Insufficient documentation

## 2021-05-25 DIAGNOSIS — I5032 Chronic diastolic (congestive) heart failure: Secondary | ICD-10-CM | POA: Diagnosis not present

## 2021-05-25 DIAGNOSIS — E1122 Type 2 diabetes mellitus with diabetic chronic kidney disease: Secondary | ICD-10-CM | POA: Insufficient documentation

## 2021-05-25 DIAGNOSIS — Z950 Presence of cardiac pacemaker: Secondary | ICD-10-CM | POA: Insufficient documentation

## 2021-05-25 DIAGNOSIS — I130 Hypertensive heart and chronic kidney disease with heart failure and stage 1 through stage 4 chronic kidney disease, or unspecified chronic kidney disease: Secondary | ICD-10-CM | POA: Diagnosis not present

## 2021-05-25 DIAGNOSIS — Z79899 Other long term (current) drug therapy: Secondary | ICD-10-CM | POA: Diagnosis not present

## 2021-05-25 DIAGNOSIS — I4891 Unspecified atrial fibrillation: Secondary | ICD-10-CM

## 2021-05-25 DIAGNOSIS — N183 Chronic kidney disease, stage 3 unspecified: Secondary | ICD-10-CM | POA: Insufficient documentation

## 2021-05-25 DIAGNOSIS — Z87891 Personal history of nicotine dependence: Secondary | ICD-10-CM | POA: Insufficient documentation

## 2021-05-25 DIAGNOSIS — I495 Sick sinus syndrome: Secondary | ICD-10-CM | POA: Diagnosis not present

## 2021-05-25 DIAGNOSIS — I951 Orthostatic hypotension: Secondary | ICD-10-CM | POA: Diagnosis not present

## 2021-05-25 LAB — BASIC METABOLIC PANEL
Anion gap: 6 (ref 5–15)
BUN: 14 mg/dL (ref 8–23)
CO2: 26 mmol/L (ref 22–32)
Calcium: 8.9 mg/dL (ref 8.9–10.3)
Chloride: 105 mmol/L (ref 98–111)
Creatinine, Ser: 1.13 mg/dL (ref 0.61–1.24)
GFR, Estimated: >60 mL/min (ref >60)
Glucose, Bld: 92 mg/dL (ref 70–99)
Potassium: 4.1 mmol/L (ref 3.5–5.1)
Sodium: 137 mmol/L (ref 135–145)

## 2021-05-25 MED ORDER — FUROSEMIDE 20 MG PO TABS: ORAL_TABLET | ORAL | 3 refills | Status: DC

## 2021-05-25 NOTE — Patient Instructions (Signed)
Stop Amiodarone   Increase Furosemide to 20 mg (1 tab) Daily every other day ALTERNATING with 40 mg (2 tabs) Daily every other day   Labs done today, your results will be available in MyChart, we will contact you for abnormal readings.  Your physician recommends that you return for lab work in: 1-2 weeks  Your physician recommends that you schedule a follow-up appointment in: 3 months  If you have any questions or concerns before your next appointment please send [place] a message through [place] or call our office at [phone].    TO LEAVE A MESSAGE FOR THE NURSE SELECT OPTION 2, PLEASE LEAVE A MESSAGE INCLUDING: YOUR NAME DATE OF BIRTH CALL BACK NUMBER REASON FOR CALL**this is important as we prioritize the call backs  YOU WILL RECEIVE A CALL BACK THE SAME DAY AS LONG AS YOU CALL BEFORE 4:00 PM  At the [facility], you and your health needs are our priority. As part of our continuing mission to provide you with exceptional heart care, we have created designated Provider Care Teams. These Care Teams include your primary Cardiologist (physician) and Advanced Practice Providers (APPs- Physician Assistants and Nurse Practitioners) who all work together to provide you with the care you need, when you need it.   You may see any of the following providers on your designated Care Team at your next follow up: Dr Glori Bickers Dr Loralie Champagne Dr Patrice Paradise, NP Lyda Jester, [place] Ginnie Smart Audry Riles, PharmD   Please be sure to bring in all your medications bottles to every appointment.

## 2021-05-26 NOTE — Progress Notes (Signed)
ID:  Dylan [name], DOB Sep 15, 1925, MRN [id number]   Provider location: [facility] Type of Visit: Established patient  PCP:  Haywood Pao, MD  Cardiologist:  Loralie Champagne, MD   History of Present Illness: Dylan [name] is a 87 y.o. with history of DM and HTN as well as atrial fibrillation and CHF.    Patient was doing well until 11/18 when he developed exertional dyspnea.  He went to the ER 07/14/17 and was found to be in atrial fibrillation with volume overload.  He was admitted overnight, diuresed and started on Eliquis, and discharged.  He remained in atrial fibrillation and short of breath.   He was re-hospitalized with dyspnea in 12/18.  TEE-guided DCCV to NSR was done and he was diuresed. TEE showed EF 50%.    He was noted to be back in atrial fibrillation and was started on amiodarone.  TEE-guided DCCV in 1/19 was done, with conversion back to NSR.   He was admitted in 2/19 with syncope and found to have tachy-brady syndrome with junctional bradycardia (symptomatic). Medtronic dual chamber PPM was placed.   He was readmitted in 2/19 with pneumonia.  During this admission, he was noted to be back in atrial fibrillation but converted back to NSR.   In 12/20, he developed COVID-19 pneumonitis and was hospitalized at [facility].  He developed orthostatic hypotension after COVID-19.  He was started on midodrine.   PYP scan in 9/21 was not suggestive of TTR cardiac amyloidosis.   Echo in 9/22 showed EF 55-60%, mild LVH (reviewed echo).   He returns for followup of atrial fibrillation and CHF.  Generally doing ok.  Less lightheadedness with standing, no falls.  Mild dyspnea walking into the office using his walker. No chest pain.  No orthopnea/PND.   Medtronic device interrogation: no atrial fibrillation, 99% a-pacing  Labs (12/20): hgb 12.9, TSH normal, BNP 72, LFTs normal, K 3.9, creatinine 0.88 Labs (2/21): TSH normal, hgb 13, LDL 52, K  3.8, creatinine 1.03, LFTs normal Labs (6/21): hgb 13.2, TSH normal, K 4, creatinine 1.19, LFTs normal Labs (9/21): Myeloma panel and urine immunofixation negative, LFTs normal, K 4.1, creatinine 1.37 Labs (8/22): hgb 13.2, TSH normal, K 4.5, creatinine 1.22, LFTs normal.   ECG (personally reviewed): a-paced rhythm  PMH: 1. Chronic diastolic CHF: Echo (97/02) with EF 50-55%, mild-moderate LVH, mild AI, mild MR, normal RV size and systolic function, PASP 45 mmHg.  - TEE (12/18): EF 50%, normal RV size and systolic function, mild MR.  - TEE (1/19): EF 55-60%, mild LVH, mild MR, normal RV, aortic sclerosis without significant stenosis.  - PYP scan (9/21): grade 1, H/CL 1.03 => unlikely TTR cardiac amyloidosis.  - Echo (9/22): EF 55-60%, mild LVH (mild review) 2. Atrial fibrillation: Persistent, initially diagnosed in 11/18.  - TEE-guided DCCV in 12/18.  - TEE-guided DCCV in 1/19 on amiodarone.  3. Chest pain: LHC remotely without obstructive disease.  1/16 Cardiolite with no ischemia.  4. Type II diabetes 5. HTN 6. CKD: Stage 3.  7. Tachy-brady syndrome: Medtronic dual chamber.   8. Orthostatic hypotension.  9. COVID-19 pneumonitis 10. ABIs (6/19): Normal 11. Spinal stenosis  Social History   Socioeconomic History   Marital status: Married    Spouse name: Not on file   Number of children: Not on file   Years of education: Not on file   Highest education level: Not on file  Occupational History   Not  on file  Tobacco Use   Smoking status: Former    Packs/day: 1.00    Types: Cigarettes    Quit date: 01/07/1992    Years since quitting: 29.4   Smokeless tobacco: Never  Substance and Sexual Activity   Alcohol use: Not Currently    Alcohol/week: 2.0 standard drinks    Types: 2 Glasses of wine per week   Drug use: No   Sexual activity: Yes  Other Topics Concern   Not on file  Social History Narrative   Not on file   Social Determinants of Health   Financial Resource  Strain: Not on file  Food Insecurity: Not on file  Transportation Needs: Not on file  Physical Activity: Not on file  Stress: Not on file  Social Connections: Not on file  Intimate Partner Violence: Not on file   Family History  Problem Relation Age of Onset   Cancer - Other Mother    CVA Father    Heart attack Brother    Heart attack Maternal Grandmother    ROS: All systems reviewed and negative except as per HPI.  Current Outpatient Medications  Medication Sig Dispense Refill   acetaminophen (TYLENOL) 325 MG tablet Take 650 mg by mouth every 6 (six) hours as needed for mild pain or headache.      Cholecalciferol (VITAMIN D) 2000 units CAPS Take 2,000 Units by mouth daily.      ELIQUIS 5 MG TABS tablet TAKE 1 TABLET BY MOUTH TWICE DAILY . APPOINTMENT REQUIRED FOR FUTURE REFILLS 60 tablet 1   midodrine (PROAMATINE) 5 MG tablet Take 1 tablet (5 mg total) by mouth 3 (three) times daily. 90 tablet 3   Omega-3 Fatty Acids (FISH OIL) 1200 MG CAPS Take 1,200 mg by mouth daily.      rosuvastatin (CRESTOR) 10 MG tablet Take 1 tablet by mouth once daily 90 tablet 3   vitamin B-12 (CYANOCOBALAMIN) 500 MCG tablet 1 tablet     furosemide (LASIX) 20 MG tablet Take 20 mg (1 tab) Daily every other day ALTERNATING with 40 mg (2 tabs) Daily every other day 45 tablet 3   No current facility-administered medications for this encounter.   BP 130/70   Pulse 73   Wt 111.6 kg (246 lb)   SpO2 96%   BMI 33.36 kg/m  General: NAD Neck: JVP 8 cm, no thyromegaly or thyroid nodule.  Lungs: Clear to auscultation bilaterally with normal respiratory effort. CV: Nondisplaced PMI.  Heart regular S1/S2, no S3/S4, no murmur.  1+ ankle edema.  No carotid bruit.  Normal pedal pulses.  Abdomen: Soft, nontender, no hepatosplenomegaly, no distention.  Skin: Intact without lesions or rashes.  Neurologic: Alert and oriented x 3.  Psych: Normal affect. Extremities: No clubbing or cyanosis.  HEENT: Normal.    Assessment/Plan: 1. Atrial fibrillation: DCCV in 12/18.  Recurrence of atrial fibrillation with initiation of amiodarone then TEE-guided DCCV in 1/19.  He has not felt palpitations. He is in NSR today.  - We will stop amiodarone today, will see if this helps with balance and lightheadedness.    - Continue apixaban.  2. CKD: BMET today.   3. Chronic diastolic CHF: EF 95-62% from 1/19 TEE.  Prior workup negative for cardiac amyloidosis.  Echo in 9/22 with mild LVH, EF 55-60% (my review).  Mild volume overload on exam.  - Increase Lasix to 40 mg daily alternating with 20 mg daily.  BMET/BNP today, BMET 10 days.  4. Orthostatic hypotension: This has  been present since his COVID-19 infection and may be related.  He is not orthostatic today.  Main issue now seems leg weakness due to spinal stenosis rather than orthostatic hypotension though he notes occasional mild symptoms.  - For now, continue midodrine 5 mg tid.   - Continue to wear compression stockings during the day.  - Stop amiodarone as above.   Followup in 3 months.   Signed, Loralie Champagne, MD  05/26/2021  [facility] [address] [facility] [place] [address] [phone] (office) [phone] (fax)

## 2021-06-06 ENCOUNTER — Ambulatory Visit (HOSPITAL_COMMUNITY)
Admission: RE | Admit: 2021-06-06 | Discharge: 2021-06-06 | Disposition: A | Discharge status: Home / Self Care | Payer: Medicare HMO | Source: Ambulatory Visit | Attending: Internal Medicine | Admitting: Internal Medicine

## 2021-06-06 ENCOUNTER — Other Ambulatory Visit: Payer: Self-pay

## 2021-06-06 DIAGNOSIS — I5032 Chronic diastolic (congestive) heart failure: Secondary | ICD-10-CM | POA: Diagnosis not present

## 2021-06-06 LAB — BASIC METABOLIC PANEL
Anion gap: 9 (ref 5–15)
BUN: 15 mg/dL (ref 8–23)
CO2: 26 mmol/L (ref 22–32)
Calcium: 9.1 mg/dL (ref 8.9–10.3)
Chloride: 104 mmol/L (ref 98–111)
Creatinine, Ser: 1.25 mg/dL — ABNORMAL HIGH (ref 0.61–1.24)
GFR, Estimated: 54 mL/min — ABNORMAL LOW (ref >60)
Glucose, Bld: 107 mg/dL — ABNORMAL HIGH (ref 70–99)
Potassium: 3.9 mmol/L (ref 3.5–5.1)
Sodium: 139 mmol/L (ref 135–145)

## 2021-06-10 DIAGNOSIS — Z23 Encounter for immunization: Secondary | ICD-10-CM | POA: Diagnosis not present

## 2021-06-15 DIAGNOSIS — Z0100 Encounter for examination of eyes and vision without abnormal findings: Secondary | ICD-10-CM | POA: Diagnosis not present

## 2021-06-15 DIAGNOSIS — E119 Type 2 diabetes mellitus without complications: Secondary | ICD-10-CM | POA: Diagnosis not present

## 2021-06-15 DIAGNOSIS — H43813 Vitreous degeneration, bilateral: Secondary | ICD-10-CM | POA: Diagnosis not present

## 2021-06-15 DIAGNOSIS — H524 Presbyopia: Secondary | ICD-10-CM | POA: Diagnosis not present

## 2021-06-15 DIAGNOSIS — Z961 Presence of intraocular lens: Secondary | ICD-10-CM | POA: Diagnosis not present

## 2021-06-29 ENCOUNTER — Ambulatory Visit (INDEPENDENT_AMBULATORY_CARE_PROVIDER_SITE_OTHER): Payer: Medicare HMO

## 2021-06-29 DIAGNOSIS — I442 Atrioventricular block, complete: Secondary | ICD-10-CM | POA: Diagnosis not present

## 2021-06-29 LAB — CUP PACEART REMOTE DEVICE CHECK
Battery Remaining Longevity: 116 mo
Battery Voltage: 3 V
Brady Statistic AP VP Percent: 5.24 %
Brady Statistic AP VS Percent: 94.17 %
Brady Statistic AS VP Percent: 0 %
Brady Statistic AS VS Percent: 0.59 %
Brady Statistic RA Percent Paced: 99.75 %
Brady Statistic RV Percent Paced: 5.25 %
Date Time Interrogation Session: 20221109215203
Implantable Lead Implant Date: 20190216
Implantable Lead Implant Date: 20190216
Implantable Lead Location: 753859
Implantable Lead Location: 753860
Implantable Lead Model: 5076
Implantable Lead Model: 5076
Implantable Pulse Generator Implant Date: 20190216
Lead Channel Impedance Value: 304 Ohm
Lead Channel Impedance Value: 304 Ohm
Lead Channel Impedance Value: 380 Ohm
Lead Channel Impedance Value: 437 Ohm
Lead Channel Pacing Threshold Amplitude: 0.75 V
Lead Channel Pacing Threshold Amplitude: 1.125 V
Lead Channel Pacing Threshold Pulse Width: 0.4 ms
Lead Channel Pacing Threshold Pulse Width: 0.4 ms
Lead Channel Sensing Intrinsic Amplitude: 1.5 mV
Lead Channel Sensing Intrinsic Amplitude: 1.5 mV
Lead Channel Sensing Intrinsic Amplitude: 2.125 mV
Lead Channel Sensing Intrinsic Amplitude: 2.125 mV
Lead Channel Setting Pacing Amplitude: 1.5 V
Lead Channel Setting Pacing Amplitude: 2.5 V
Lead Channel Setting Pacing Pulse Width: 0.4 ms
Lead Channel Setting Sensing Sensitivity: 1.2 mV

## 2021-07-06 DIAGNOSIS — G6289 Other specified polyneuropathies: Secondary | ICD-10-CM | POA: Diagnosis not present

## 2021-07-06 DIAGNOSIS — M545 Low back pain, unspecified: Secondary | ICD-10-CM | POA: Diagnosis not present

## 2021-07-06 DIAGNOSIS — M160 Bilateral primary osteoarthritis of hip: Secondary | ICD-10-CM | POA: Diagnosis not present

## 2021-07-06 DIAGNOSIS — I480 Paroxysmal atrial fibrillation: Secondary | ICD-10-CM | POA: Diagnosis not present

## 2021-07-06 DIAGNOSIS — I110 Hypertensive heart disease with heart failure: Secondary | ICD-10-CM | POA: Diagnosis not present

## 2021-07-06 DIAGNOSIS — Z7901 Long term (current) use of anticoagulants: Secondary | ICD-10-CM | POA: Diagnosis not present

## 2021-07-06 DIAGNOSIS — E1151 Type 2 diabetes mellitus with diabetic peripheral angiopathy without gangrene: Secondary | ICD-10-CM | POA: Diagnosis not present

## 2021-07-06 DIAGNOSIS — I5032 Chronic diastolic (congestive) heart failure: Secondary | ICD-10-CM | POA: Diagnosis not present

## 2021-07-06 DIAGNOSIS — Z950 Presence of cardiac pacemaker: Secondary | ICD-10-CM | POA: Diagnosis not present

## 2021-07-06 DIAGNOSIS — M6281 Muscle weakness (generalized): Secondary | ICD-10-CM | POA: Diagnosis not present

## 2021-07-06 DIAGNOSIS — D6869 Other thrombophilia: Secondary | ICD-10-CM | POA: Diagnosis not present

## 2021-07-06 DIAGNOSIS — E669 Obesity, unspecified: Secondary | ICD-10-CM | POA: Diagnosis not present

## 2021-07-06 DIAGNOSIS — E7800 Pure hypercholesterolemia, unspecified: Secondary | ICD-10-CM | POA: Diagnosis not present

## 2021-07-07 NOTE — Progress Notes (Signed)
Remote pacemaker transmission.   

## 2021-07-11 ENCOUNTER — Other Ambulatory Visit (HOSPITAL_COMMUNITY): Payer: Self-pay | Admitting: Cardiology

## 2021-08-18 DIAGNOSIS — I5032 Chronic diastolic (congestive) heart failure: Secondary | ICD-10-CM | POA: Diagnosis not present

## 2021-08-18 DIAGNOSIS — E7800 Pure hypercholesterolemia, unspecified: Secondary | ICD-10-CM | POA: Diagnosis not present

## 2021-08-18 DIAGNOSIS — I110 Hypertensive heart disease with heart failure: Secondary | ICD-10-CM | POA: Diagnosis not present

## 2021-08-18 DIAGNOSIS — E1151 Type 2 diabetes mellitus with diabetic peripheral angiopathy without gangrene: Secondary | ICD-10-CM | POA: Diagnosis not present

## 2021-08-28 ENCOUNTER — Other Ambulatory Visit: Payer: Self-pay

## 2021-08-28 ENCOUNTER — Ambulatory Visit: Payer: Medicare HMO | Admitting: Podiatry

## 2021-08-28 ENCOUNTER — Encounter: Payer: Self-pay | Admitting: Podiatry

## 2021-08-28 DIAGNOSIS — B351 Tinea unguium: Secondary | ICD-10-CM

## 2021-08-28 DIAGNOSIS — M79674 Pain in right toe(s): Secondary | ICD-10-CM

## 2021-08-28 DIAGNOSIS — E1159 Type 2 diabetes mellitus with other circulatory complications: Secondary | ICD-10-CM

## 2021-08-28 DIAGNOSIS — E1142 Type 2 diabetes mellitus with diabetic polyneuropathy: Secondary | ICD-10-CM

## 2021-08-28 DIAGNOSIS — D689 Coagulation defect, unspecified: Secondary | ICD-10-CM

## 2021-08-28 DIAGNOSIS — M79675 Pain in left toe(s): Secondary | ICD-10-CM

## 2021-08-28 NOTE — Progress Notes (Signed)
This patient returns to my office for at risk foot care.  This patient requires this care by a professional since this patient will be at risk due to having diabetic neuropathy,and coagulation defect.  Patient is taking eliquis.    Patient presents to the office with his wife.  This patient is unable to cut nails himself since the patient cannot reach his nails.These nails are painful walking and wearing shoes.  This patient presents for at risk foot care today.  General Appearance  Alert, conversant and in no acute stress.  Vascular  Dorsalis pedis and posterior tibial  pulses are palpable  Left foot . Absent dorsalis pedis and posterior tibial pulses right foot.  Capillary return is within normal limits  bilaterally. Temperature is within normal limits  Bilaterally.  Venous stasis  B/L.  Neurologic  Senn-Weinstein monofilament wire test diminished  bilaterally. Muscle power within normal limits bilaterally.  Nails Thick disfigured discolored nails with subungual debris  from hallux to fifth toes bilaterally. No evidence of bacterial infection or drainage bilaterally.  Orthopedic  No limitations of motion  feet .  No crepitus or effusions noted.  Hallux malleus IPJ  B/L.  Hammer toes 2  B/L.  Midfoot DJD  B/L.  HAV  B/L.    Skin  normotropic skin with no porokeratosis noted bilaterally.  No signs of infections or ulcers noted.     Onychomycosis  Pain in right toes  Pain in left toes  Consent was obtained for treatment procedures.   Mechanical debridement of nails 1-5  bilaterally performed with a nail nipper.  Filed with dremel without incident. Visual inspection was performed.   Return office visit    3 months                  Told patient to return for periodic foot care and evaluation due to potential at risk complications.   Enrique Weiss DPM  

## 2021-08-31 ENCOUNTER — Other Ambulatory Visit (HOSPITAL_COMMUNITY): Payer: Self-pay

## 2021-08-31 ENCOUNTER — Other Ambulatory Visit: Payer: Self-pay

## 2021-08-31 ENCOUNTER — Encounter (HOSPITAL_COMMUNITY): Payer: Self-pay | Admitting: Cardiology

## 2021-08-31 ENCOUNTER — Ambulatory Visit (HOSPITAL_COMMUNITY)
Admission: RE | Admit: 2021-08-31 | Discharge: 2021-08-31 | Disposition: A | Discharge status: Home / Self Care | Payer: Medicare HMO | Source: Ambulatory Visit | Attending: Cardiology | Admitting: Cardiology

## 2021-08-31 VITALS — BP 124/80 | HR 78 | Wt 245.6 lb

## 2021-08-31 DIAGNOSIS — I130 Hypertensive heart and chronic kidney disease with heart failure and stage 1 through stage 4 chronic kidney disease, or unspecified chronic kidney disease: Secondary | ICD-10-CM | POA: Diagnosis not present

## 2021-08-31 DIAGNOSIS — I4439 Other atrioventricular block: Secondary | ICD-10-CM | POA: Insufficient documentation

## 2021-08-31 DIAGNOSIS — I4891 Unspecified atrial fibrillation: Secondary | ICD-10-CM | POA: Diagnosis not present

## 2021-08-31 DIAGNOSIS — I4892 Unspecified atrial flutter: Secondary | ICD-10-CM | POA: Insufficient documentation

## 2021-08-31 DIAGNOSIS — I951 Orthostatic hypotension: Secondary | ICD-10-CM | POA: Insufficient documentation

## 2021-08-31 DIAGNOSIS — E1122 Type 2 diabetes mellitus with diabetic chronic kidney disease: Secondary | ICD-10-CM | POA: Diagnosis not present

## 2021-08-31 DIAGNOSIS — Z8616 Personal history of COVID-19: Secondary | ICD-10-CM | POA: Insufficient documentation

## 2021-08-31 DIAGNOSIS — Z8701 Personal history of pneumonia (recurrent): Secondary | ICD-10-CM | POA: Diagnosis not present

## 2021-08-31 DIAGNOSIS — Z79899 Other long term (current) drug therapy: Secondary | ICD-10-CM | POA: Insufficient documentation

## 2021-08-31 DIAGNOSIS — Z7901 Long term (current) use of anticoagulants: Secondary | ICD-10-CM | POA: Diagnosis not present

## 2021-08-31 DIAGNOSIS — N183 Chronic kidney disease, stage 3 unspecified: Secondary | ICD-10-CM | POA: Diagnosis not present

## 2021-08-31 DIAGNOSIS — I5032 Chronic diastolic (congestive) heart failure: Secondary | ICD-10-CM | POA: Diagnosis not present

## 2021-08-31 DIAGNOSIS — Z950 Presence of cardiac pacemaker: Secondary | ICD-10-CM | POA: Insufficient documentation

## 2021-08-31 LAB — CBC
HCT: 38.5 % — ABNORMAL LOW (ref 39.0–52.0)
Hemoglobin: 12.8 g/dL — ABNORMAL LOW (ref 13.0–17.0)
MCH: 32 pg (ref 26.0–34.0)
MCHC: 33.2 g/dL (ref 30.0–36.0)
MCV: 96.3 fL (ref 80.0–100.0)
Platelets: 143 10*3/uL — ABNORMAL LOW (ref 150–400)
RBC: 4 MIL/uL — ABNORMAL LOW (ref 4.22–5.81)
RDW: 12.6 % (ref 11.5–15.5)
WBC: 6 10*3/uL (ref 4.0–10.5)
nRBC: 0 % (ref 0.0–0.2)

## 2021-08-31 LAB — BASIC METABOLIC PANEL
Anion gap: 10 (ref 5–15)
BUN: 11 mg/dL (ref 8–23)
CO2: 24 mmol/L (ref 22–32)
Calcium: 9.1 mg/dL (ref 8.9–10.3)
Chloride: 104 mmol/L (ref 98–111)
Creatinine, Ser: 1.06 mg/dL (ref 0.61–1.24)
GFR, Estimated: >60 mL/min (ref >60)
Glucose, Bld: 82 mg/dL (ref 70–99)
Potassium: 3.6 mmol/L (ref 3.5–5.1)
Sodium: 138 mmol/L (ref 135–145)

## 2021-08-31 MED ORDER — AMIODARONE HCL 200 MG PO TABS: ORAL_TABLET | ORAL | 2 refills | Status: DC

## 2021-08-31 MED ORDER — FUROSEMIDE 40 MG PO TABS: 40.0000 mg | ORAL_TABLET | Freq: Every day | ORAL | 3 refills | Status: DC

## 2021-08-31 NOTE — H&P (View-Only) (Signed)
ID:  Dylan [name], DOB [birth year], MRN [id number]   Provider location: [facility] Type of Visit: Established patient  PCP:  Haywood Pao, MD  Cardiologist:  Loralie Champagne, MD   History of Present Illness: Dylan [name] is a 88 y.o. with history of DM and HTN as well as atrial fibrillation and CHF.    Patient was doing well until 11/18 when he developed exertional dyspnea.  He went to the ER 07/14/17 and was found to be in atrial fibrillation with volume overload.  He was admitted overnight, diuresed and started on Eliquis, and discharged.  He remained in atrial fibrillation and short of breath.   He was re-hospitalized with dyspnea in 12/18.  TEE-guided DCCV to NSR was done and he was diuresed. TEE showed EF 50%.    He was noted to be back in atrial fibrillation and was started on amiodarone.  TEE-guided DCCV in 1/19 was done, with conversion back to NSR.   He was admitted in 2/19 with syncope and found to have tachy-brady syndrome with junctional bradycardia (symptomatic). Medtronic dual chamber PPM was placed.   He was readmitted in 2/19 with pneumonia.  During this admission, he was noted to be back in atrial fibrillation but converted back to NSR.   In 12/20, he developed COVID-19 pneumonitis and was hospitalized at [facility].  He developed orthostatic hypotension after COVID-19.  He was started on midodrine.   PYP scan in 9/21 was not suggestive of TTR cardiac amyloidosis.   Echo in 9/22 showed EF 55-60%, mild LVH (reviewed echo).   He returns for followup of atrial fibrillation and CHF.  At last appointment, amiodarone was stopped.  Today, he is in atrial fibrillation. Review of device shows that he has been in AF since the end of December. Weight down stable.  He has been more short of breath for the last 2 weeks or so.  He uses a walker due to trouble with balance.  He still goes to the [facility] and rides the exercise bike there several  times/week.  He now, however, gets short of breath walking about 50-100 feet.   Medtronic device interrogation: Atrial fibrillation since the end of December.   Labs (12/20): hgb 12.9, TSH normal, BNP 72, LFTs normal, K 3.9, creatinine 0.88 Labs (2/21): TSH normal, hgb 13, LDL 52, K 3.8, creatinine 1.03, LFTs normal Labs (6/21): hgb 13.2, TSH normal, K 4, creatinine 1.19, LFTs normal Labs (9/21): Myeloma panel and urine immunofixation negative, LFTs normal, K 4.1, creatinine 1.37 Labs (8/22): hgb 13.2, TSH normal, K 4.5, creatinine 1.22, LFTs normal.  Labs (10/22): K 3.9, creatinine 1.25  ECG (personally reviewed): atrial flutter rate 79  PMH: 1. Chronic diastolic CHF: Echo (40/81) with EF 50-55%, mild-moderate LVH, mild AI, mild MR, normal RV size and systolic function, PASP 45 mmHg.  - TEE (12/18): EF 50%, normal RV size and systolic function, mild MR.  - TEE (1/19): EF 55-60%, mild LVH, mild MR, normal RV, aortic sclerosis without significant stenosis.  - PYP scan (9/21): grade 1, H/CL 1.03 => unlikely TTR cardiac amyloidosis.  - Echo (9/22): EF 55-60%, mild LVH (mild review) 2. Atrial fibrillation: Persistent, initially diagnosed in 11/18.  - TEE-guided DCCV in 12/18.  - TEE-guided DCCV in 1/19 on amiodarone.  3. Chest pain: LHC remotely without obstructive disease.  1/16 Cardiolite with no ischemia.  4. Type II diabetes 5. HTN 6. CKD: Stage 3.  7. Tachy-brady syndrome: Medtronic  dual chamber.   8. Orthostatic hypotension.  9. COVID-19 pneumonitis 10. ABIs (6/19): Normal 11. Spinal stenosis  Social History   Socioeconomic History   Marital status: Married    Spouse name: Not on file   Number of children: Not on file   Years of education: Not on file   Highest education level: Not on file  Occupational History   Not on file  Tobacco Use   Smoking status: Former    Packs/day: 1.00    Types: Cigarettes    Quit date: 01/07/1992    Years since quitting: 29.6   Smokeless  tobacco: Never  Substance and Sexual Activity   Alcohol use: Not Currently    Alcohol/week: 2.0 standard drinks    Types: 2 Glasses of wine per week   Drug use: No   Sexual activity: Yes  Other Topics Concern   Not on file  Social History Narrative   Not on file   Social Determinants of Health   Financial Resource Strain: Not on file  Food Insecurity: Not on file  Transportation Needs: Not on file  Physical Activity: Not on file  Stress: Not on file  Social Connections: Not on file  Intimate Partner Violence: Not on file   Family History  Problem Relation Age of Onset   Cancer - Other Mother    CVA Father    Heart attack Brother    Heart attack Maternal Grandmother    ROS: All systems reviewed and negative except as per HPI.  Current Outpatient Medications  Medication Sig Dispense Refill   acetaminophen (TYLENOL) 325 MG tablet Take 650 mg by mouth every 6 (six) hours as needed for mild pain or headache.      amiodarone (PACERONE) 200 MG tablet 1 tablet twice a day for 2 weeks, THEN 1 tab daily 45 tablet 2   Cholecalciferol (VITAMIN D) 2000 units CAPS Take 2,000 Units by mouth daily.      ELIQUIS 5 MG TABS tablet TAKE 1 TABLET BY MOUTH TWICE DAILY . APPOINTMENT REQUIRED FOR FUTURE REFILLS 60 tablet 11   furosemide (LASIX) 40 MG tablet Take 1 tablet (40 mg total) by mouth daily. 90 tablet 3   midodrine (PROAMATINE) 5 MG tablet Take 1 tablet (5 mg total) by mouth 3 (three) times daily. 90 tablet 3   Omega-3 Fatty Acids (FISH OIL) 1200 MG CAPS Take 1,200 mg by mouth daily.      rosuvastatin (CRESTOR) 10 MG tablet Take 1 tablet by mouth once daily 90 tablet 3   vitamin B-12 (CYANOCOBALAMIN) 500 MCG tablet 1 tablet     No current facility-administered medications for this encounter.   BP 124/80    Pulse 78    Wt 111.4 kg (245 lb 9.6 oz)    SpO2 98%    BMI 33.31 kg/m  General: NAD Neck: JVP 8 cm, no thyromegaly or thyroid nodule.  Lungs: Clear to auscultation bilaterally  with normal respiratory effort. CV: Nondisplaced PMI.  Heart riregular S1/S2, no S3/S4, no murmur.  1+ edema 1/2 to knees bilaterally.  No carotid bruit.  Normal pedal pulses.  Abdomen: Soft, nontender, no hepatosplenomegaly, no distention.  Skin: Intact without lesions or rashes.  Neurologic: Alert and oriented x 3.  Psych: Normal affect. Extremities: No clubbing or cyanosis.  HEENT: Normal.   Assessment/Plan: 1. Atrial fibrillation: DCCV in 12/18.  Recurrence of atrial fibrillation with initiation of amiodarone then TEE-guided DCCV in 1/19.  Amiodarone stopped at last appointment, now back in  AF persistently for the last couple of weeks.  He appears to have developed a CHF exacerbation in the the setting of atrial fibrillation.  - Restart amiodarone 200 mg bid x 2 wks then 200 mg daily.     - Continue apixaban, he has not missed doses. - I will arrange for DCCV in a couple of weeks after amiodarone load.  We discussed risks/benefits and he agrees to procedure.  2. CKD: BMET today.   3. Chronic diastolic CHF: EF 16-10% from 1/19 TEE.  Prior workup negative for cardiac amyloidosis.  Echo in 9/22 with mild LVH, EF 55-60% (my review).  NYHA class III symptoms, worse recently.  He is volume overloaded on exam.  I suspect that CHF exacerbation is related to the onset of persistently atrial fibrillation.  - Increase Lasix to 60 mg daily x 2 days then 40 mg daily after that.  Add KCl 20 daily.  BMET today and again in 10 days.  - Need to get him back into NSR as above.  4. Orthostatic hypotension: This has been present since his COVID-19 infection and may be related.  This seems to be improved.   Followup in 1 month.    Signed, Loralie Champagne, MD  08/31/2021  [employer] [address] Heart and [place] [address] [phone] (office) [phone] (fax)

## 2021-08-31 NOTE — Progress Notes (Signed)
ID:  Dylan [name], DOB 1925/12/22, MRN [id number]   Provider location: [facility] Type of Visit: Established patient  PCP:  Haywood Pao, MD  Cardiologist:  Loralie Champagne, MD   History of Present Illness: Dylan [name] is a 88 y.o. with history of DM and HTN as well as atrial fibrillation and CHF.    Patient was doing well until 11/18 when he developed exertional dyspnea.  He went to the ER 07/14/17 and was found to be in atrial fibrillation with volume overload.  He was admitted overnight, diuresed and started on Eliquis, and discharged.  He remained in atrial fibrillation and short of breath.   He was re-hospitalized with dyspnea in 12/18.  TEE-guided DCCV to NSR was done and he was diuresed. TEE showed EF 50%.    He was noted to be back in atrial fibrillation and was started on amiodarone.  TEE-guided DCCV in 1/19 was done, with conversion back to NSR.   He was admitted in 2/19 with syncope and found to have tachy-brady syndrome with junctional bradycardia (symptomatic). Medtronic dual chamber PPM was placed.   He was readmitted in 2/19 with pneumonia.  During this admission, he was noted to be back in atrial fibrillation but converted back to NSR.   In 12/20, he developed COVID-19 pneumonitis and was hospitalized at [facility].  He developed orthostatic hypotension after COVID-19.  He was started on midodrine.   PYP scan in 9/21 was not suggestive of TTR cardiac amyloidosis.   Echo in 9/22 showed EF 55-60%, mild LVH (reviewed echo).   He returns for followup of atrial fibrillation and CHF.  At last appointment, amiodarone was stopped.  Today, he is in atrial fibrillation. Review of device shows that he has been in AF since the end of December. Weight down stable.  He has been more short of breath for the last 2 weeks or so.  He uses a walker due to trouble with balance.  He still goes to the [facility] and rides the exercise bike there several  times/week.  He now, however, gets short of breath walking about 50-100 feet.   Medtronic device interrogation: Atrial fibrillation since the end of December.   Labs (12/20): hgb 12.9, TSH normal, BNP 72, LFTs normal, K 3.9, creatinine 0.88 Labs (2/21): TSH normal, hgb 13, LDL 52, K 3.8, creatinine 1.03, LFTs normal Labs (6/21): hgb 13.2, TSH normal, K 4, creatinine 1.19, LFTs normal Labs (9/21): Myeloma panel and urine immunofixation negative, LFTs normal, K 4.1, creatinine 1.37 Labs (8/22): hgb 13.2, TSH normal, K 4.5, creatinine 1.22, LFTs normal.  Labs (10/22): K 3.9, creatinine 1.25  ECG (personally reviewed): atrial flutter rate 79  PMH: 1. Chronic diastolic CHF: Echo (75/17) with EF 50-55%, mild-moderate LVH, mild AI, mild MR, normal RV size and systolic function, PASP 45 mmHg.  - TEE (12/18): EF 50%, normal RV size and systolic function, mild MR.  - TEE (1/19): EF 55-60%, mild LVH, mild MR, normal RV, aortic sclerosis without significant stenosis.  - PYP scan (9/21): grade 1, H/CL 1.03 => unlikely TTR cardiac amyloidosis.  - Echo (9/22): EF 55-60%, mild LVH (mild review) 2. Atrial fibrillation: Persistent, initially diagnosed in 11/18.  - TEE-guided DCCV in 12/18.  - TEE-guided DCCV in 1/19 on amiodarone.  3. Chest pain: LHC remotely without obstructive disease.  1/16 Cardiolite with no ischemia.  4. Type II diabetes 5. HTN 6. CKD: Stage 3.  7. Tachy-brady syndrome: Medtronic  dual chamber.   8. Orthostatic hypotension.  9. COVID-19 pneumonitis 10. ABIs (6/19): Normal 11. Spinal stenosis  Social History   Socioeconomic History   Marital status: Married    Spouse name: Not on file   Number of children: Not on file   Years of education: Not on file   Highest education level: Not on file  Occupational History   Not on file  Tobacco Use   Smoking status: Former    Packs/day: 1.00    Types: Cigarettes    Quit date: 01/07/1992    Years since quitting: 29.6   Smokeless  tobacco: Never  Substance and Sexual Activity   Alcohol use: Not Currently    Alcohol/week: 2.0 standard drinks    Types: 2 Glasses of wine per week   Drug use: No   Sexual activity: Yes  Other Topics Concern   Not on file  Social History Narrative   Not on file   Social Determinants of Health   Financial Resource Strain: Not on file  Food Insecurity: Not on file  Transportation Needs: Not on file  Physical Activity: Not on file  Stress: Not on file  Social Connections: Not on file  Intimate Partner Violence: Not on file   Family History  Problem Relation Age of Onset   Cancer - Other Mother    CVA Father    Heart attack Brother    Heart attack Maternal Grandmother    ROS: All systems reviewed and negative except as per HPI.  Current Outpatient Medications  Medication Sig Dispense Refill   acetaminophen (TYLENOL) 325 MG tablet Take 650 mg by mouth every 6 (six) hours as needed for mild pain or headache.      amiodarone (PACERONE) 200 MG tablet 1 tablet twice a day for 2 weeks, THEN 1 tab daily 45 tablet 2   Cholecalciferol (VITAMIN D) 2000 units CAPS Take 2,000 Units by mouth daily.      ELIQUIS 5 MG TABS tablet TAKE 1 TABLET BY MOUTH TWICE DAILY . APPOINTMENT REQUIRED FOR FUTURE REFILLS 60 tablet 11   furosemide (LASIX) 40 MG tablet Take 1 tablet (40 mg total) by mouth daily. 90 tablet 3   midodrine (PROAMATINE) 5 MG tablet Take 1 tablet (5 mg total) by mouth 3 (three) times daily. 90 tablet 3   Omega-3 Fatty Acids (FISH OIL) 1200 MG CAPS Take 1,200 mg by mouth daily.      rosuvastatin (CRESTOR) 10 MG tablet Take 1 tablet by mouth once daily 90 tablet 3   vitamin B-12 (CYANOCOBALAMIN) 500 MCG tablet 1 tablet     No current facility-administered medications for this encounter.   BP 124/80    Pulse 78    Wt 111.4 kg (245 lb 9.6 oz)    SpO2 98%    BMI 33.31 kg/m  General: NAD Neck: JVP 8 cm, no thyromegaly or thyroid nodule.  Lungs: Clear to auscultation bilaterally  with normal respiratory effort. CV: Nondisplaced PMI.  Heart riregular S1/S2, no S3/S4, no murmur.  1+ edema 1/2 to knees bilaterally.  No carotid bruit.  Normal pedal pulses.  Abdomen: Soft, nontender, no hepatosplenomegaly, no distention.  Skin: Intact without lesions or rashes.  Neurologic: Alert and oriented x 3.  Psych: Normal affect. Extremities: No clubbing or cyanosis.  HEENT: Normal.   Assessment/Plan: 1. Atrial fibrillation: DCCV in 12/18.  Recurrence of atrial fibrillation with initiation of amiodarone then TEE-guided DCCV in 1/19.  Amiodarone stopped at last appointment, now back in  AF persistently for the last couple of weeks.  He appears to have developed a CHF exacerbation in the the setting of atrial fibrillation.  - Restart amiodarone 200 mg bid x 2 wks then 200 mg daily.     - Continue apixaban, he has not missed doses. - I will arrange for DCCV in a couple of weeks after amiodarone load.  We discussed risks/benefits and he agrees to procedure.  2. CKD: BMET today.   3. Chronic diastolic CHF: EF 76-16% from 1/19 TEE.  Prior workup negative for cardiac amyloidosis.  Echo in 9/22 with mild LVH, EF 55-60% (my review).  NYHA class III symptoms, worse recently.  He is volume overloaded on exam.  I suspect that CHF exacerbation is related to the onset of persistently atrial fibrillation.  - Increase Lasix to 60 mg daily x 2 days then 40 mg daily after that.  Add KCl 20 daily.  BMET today and again in 10 days.  - Need to get him back into NSR as above.  4. Orthostatic hypotension: This has been present since his COVID-19 infection and may be related.  This seems to be improved.   Followup in 1 month.    Signed, Loralie Champagne, MD  08/31/2021  [facility] [address] Heart and [place] [address] [phone] (office) [phone] (fax)

## 2021-08-31 NOTE — Patient Instructions (Addendum)
TAKE Lasix 60mg  for 2 days THEN 40mg  daily after that  START Amiodarone 200mg  (1 tab) twice a day for 2 weeks THEN 200mg  (1 tab) daily  Labs today We will only contact you if something comes back abnormal or we need to make some changes. Otherwise no news is good news!  Your physician recommends that you schedule a follow-up appointment in: 1 month with Dr Aundra Dubin  Dear Dylan [name], Dylan [name] are scheduled for a Cardioversion on Friday, January 27th, 2023 with Dr. Aundra Dubin.  Please arrive at the [facility] (Main Entrance A) at [facility]: [address] [place], [place] [address] at 8 am.   DIET: Nothing to eat or drink after midnight except a sip of water with medications (see medication instructions below)   Medication Instructions: Hold morning medications except for Amiodarone and Eliquis.  You will need to continue your anticoagulant after your procedure until   you are told by your  Provider that it is safe to stop   Labs: Done today in office   You must have a responsible person to drive you home and stay in the waiting area during your procedure. Failure to do so could result in cancellation.  Bring your insurance cards.  *Special Note: Every effort is made to have your procedure done on time. Occasionally there are emergencies that occur at the hospital that may cause delays. Please be patient if a delay does occur.

## 2021-09-01 ENCOUNTER — Other Ambulatory Visit (HOSPITAL_COMMUNITY): Payer: Self-pay | Admitting: *Deleted

## 2021-09-06 ENCOUNTER — Encounter (HOSPITAL_COMMUNITY): Payer: Self-pay | Admitting: Cardiology

## 2021-09-06 NOTE — Progress Notes (Signed)
Attempted to obtain medical history via telephone, unable to reach at this time. I left a voicemail to return pre surgical testing department's phone call.  

## 2021-09-13 ENCOUNTER — Other Ambulatory Visit (HOSPITAL_COMMUNITY): Payer: Self-pay | Admitting: Cardiology

## 2021-09-14 NOTE — Anesthesia Preprocedure Evaluation (Addendum)
Anesthesia Evaluation  Patient identified by MRN, date of birth, ID band Patient awake    Reviewed: Allergy & Precautions, H&P , NPO status , Patient's Chart, lab work & pertinent test results  Airway Mallampati: II  TM Distance: >3 FB Neck ROM: Full    Dental no notable dental hx. (+) Upper Dentures, Dental Advisory Given   Pulmonary shortness of breath and with exertion, former smoker,    Pulmonary exam normal breath sounds clear to auscultation       Cardiovascular Exercise Tolerance: Good hypertension, Pt. on medications + Past MI and + DOE  + dysrhythmias Atrial Fibrillation + pacemaker  Rhythm:Irregular Rate:Normal     Neuro/Psych negative neurological ROS  negative psych ROS   GI/Hepatic negative GI ROS, Neg liver ROS,   Endo/Other  diabetes  Renal/GU negative Renal ROS  negative genitourinary   Musculoskeletal   Abdominal   Peds  Hematology negative hematology ROS (+)   Anesthesia Other Findings   Reproductive/Obstetrics negative OB ROS                            Anesthesia Physical Anesthesia Plan  ASA: 3  Anesthesia Plan: General   Post-op Pain Management: Minimal or no pain anticipated   Induction: Intravenous  PONV Risk Score and Plan: 2 and Propofol infusion and Treatment may vary due to age or medical condition  Airway Management Planned: Mask and Natural Airway  Additional Equipment:   Intra-op Plan:   Post-operative Plan:   Informed Consent: I have reviewed the patients History and Physical, chart, labs and discussed the procedure including the risks, benefits and alternatives for the proposed anesthesia with the patient or authorized representative who has indicated his/her understanding and acceptance.     Dental advisory given  Plan Discussed with: CRNA  Anesthesia Plan Comments:        Anesthesia Quick Evaluation

## 2021-09-15 ENCOUNTER — Ambulatory Visit (HOSPITAL_COMMUNITY): Payer: Medicare HMO | Admitting: Anesthesiology

## 2021-09-15 ENCOUNTER — Other Ambulatory Visit: Payer: Self-pay

## 2021-09-15 ENCOUNTER — Encounter (HOSPITAL_COMMUNITY): Payer: Self-pay | Admitting: Cardiology

## 2021-09-15 ENCOUNTER — Encounter (HOSPITAL_COMMUNITY)
Admission: RE | Disposition: A | Discharge status: Home / Self Care | Payer: Self-pay | Source: Ambulatory Visit | Attending: Cardiology

## 2021-09-15 ENCOUNTER — Ambulatory Visit (HOSPITAL_COMMUNITY)
Admission: RE | Admit: 2021-09-15 | Discharge: 2021-09-15 | Disposition: A | Discharge status: Home / Self Care | Payer: Medicare HMO | Source: Ambulatory Visit | Attending: Cardiology | Admitting: Cardiology

## 2021-09-15 DIAGNOSIS — I4891 Unspecified atrial fibrillation: Secondary | ICD-10-CM | POA: Diagnosis not present

## 2021-09-15 DIAGNOSIS — I130 Hypertensive heart and chronic kidney disease with heart failure and stage 1 through stage 4 chronic kidney disease, or unspecified chronic kidney disease: Secondary | ICD-10-CM | POA: Insufficient documentation

## 2021-09-15 DIAGNOSIS — I10 Essential (primary) hypertension: Secondary | ICD-10-CM | POA: Diagnosis not present

## 2021-09-15 DIAGNOSIS — Z79899 Other long term (current) drug therapy: Secondary | ICD-10-CM | POA: Insufficient documentation

## 2021-09-15 DIAGNOSIS — E1122 Type 2 diabetes mellitus with diabetic chronic kidney disease: Secondary | ICD-10-CM | POA: Insufficient documentation

## 2021-09-15 DIAGNOSIS — I4819 Other persistent atrial fibrillation: Secondary | ICD-10-CM | POA: Insufficient documentation

## 2021-09-15 DIAGNOSIS — N183 Chronic kidney disease, stage 3 unspecified: Secondary | ICD-10-CM | POA: Diagnosis not present

## 2021-09-15 DIAGNOSIS — Z7901 Long term (current) use of anticoagulants: Secondary | ICD-10-CM | POA: Diagnosis not present

## 2021-09-15 DIAGNOSIS — Z8616 Personal history of COVID-19: Secondary | ICD-10-CM | POA: Insufficient documentation

## 2021-09-15 DIAGNOSIS — Z87891 Personal history of nicotine dependence: Secondary | ICD-10-CM | POA: Diagnosis not present

## 2021-09-15 DIAGNOSIS — I951 Orthostatic hypotension: Secondary | ICD-10-CM | POA: Insufficient documentation

## 2021-09-15 DIAGNOSIS — I5032 Chronic diastolic (congestive) heart failure: Secondary | ICD-10-CM | POA: Diagnosis not present

## 2021-09-15 DIAGNOSIS — E119 Type 2 diabetes mellitus without complications: Secondary | ICD-10-CM | POA: Diagnosis not present

## 2021-09-15 DIAGNOSIS — I252 Old myocardial infarction: Secondary | ICD-10-CM | POA: Diagnosis not present

## 2021-09-15 HISTORY — PX: CARDIOVERSION: SHX1299

## 2021-09-15 LAB — POCT I-STAT, CHEM 8
BUN: 18 mg/dL (ref 8–23)
Calcium, Ion: 1.2 mmol/L (ref 1.15–1.40)
Chloride: 103 mmol/L (ref 98–111)
Creatinine, Ser: 1.1 mg/dL (ref 0.61–1.24)
Glucose, Bld: 92 mg/dL (ref 70–99)
HCT: 43 % (ref 39.0–52.0)
Hemoglobin: 14.6 g/dL (ref 13.0–17.0)
Potassium: 3.9 mmol/L (ref 3.5–5.1)
Sodium: 140 mmol/L (ref 135–145)
TCO2: 27 mmol/L (ref 22–32)

## 2021-09-15 SURGERY — CARDIOVERSION
Anesthesia: General

## 2021-09-15 MED ORDER — PROPOFOL 10 MG/ML IV BOLUS
INTRAVENOUS | Status: DC | PRN
  Administered 2021-09-15: 50 mg via INTRAVENOUS

## 2021-09-15 MED ORDER — LIDOCAINE 2% (20 MG/ML) 5 ML SYRINGE
INTRAMUSCULAR | Status: DC | PRN
  Administered 2021-09-15: 60 mg via INTRAVENOUS

## 2021-09-15 MED ORDER — SODIUM CHLORIDE 0.9 % IV SOLN: INTRAVENOUS | Status: DC

## 2021-09-15 NOTE — Discharge Instructions (Signed)

## 2021-09-15 NOTE — Procedures (Addendum)
Electrical Cardioversion Procedure Note Dylan [name] [id number] 06-17-1926  Procedure: Electrical Cardioversion Indications:  Atrial Fibrillation  Procedure Details Consent: Risks of procedure as well as the alternatives and risks of each were explained to the (patient/caregiver).  Consent for procedure obtained. Time Out: Verified patient identification, verified procedure, site/side was marked, verified correct patient position, special equipment/implants available, medications/allergies/relevent history reviewed, required imaging and test results available.  Performed  Patient placed on cardiac monitor, pulse oximetry, supplemental oxygen as necessary.  Sedation given:  Propofol per anesthesiology Pacer pads placed anterior and posterior chest.  Cardioverted 1 time(s).  Cardioverted at [place].  Evaluation Findings: Post procedure EKG shows: a-paced Complications: None Patient did tolerate procedure well.   Dylan [name] 09/15/2021, 9:48 AM

## 2021-09-15 NOTE — Interval H&P Note (Signed)
History and Physical Interval Note:  09/15/2021 9:35 AM  Dylan [name]  has presented today for surgery, with the diagnosis of A-FIB.  The various methods of treatment have been discussed with the patient and family. After consideration of risks, benefits and other options for treatment, the patient has consented to  Procedure(s): CARDIOVERSION (N/A) as a surgical intervention.  The patient's history has been reviewed, patient examined, no change in status, stable for surgery.  I have reviewed the patient's chart and labs.  Questions were answered to the patient's satisfaction.     Meaghen Vecchiarelli [employer]

## 2021-09-15 NOTE — Anesthesia Postprocedure Evaluation (Signed)
Anesthesia Post Note  Patient: Dylan [name]  Procedure(s) Performed: CARDIOVERSION     Patient location during evaluation: Endoscopy Anesthesia Type: General Level of consciousness: awake and alert Pain management: pain level controlled Vital Signs Assessment: post-procedure vital signs reviewed and stable Respiratory status: spontaneous breathing, nonlabored ventilation and respiratory function stable Cardiovascular status: blood pressure returned to baseline and stable Postop Assessment: no apparent nausea or vomiting Anesthetic complications: no   No notable events documented.  Last Vitals:  Vitals:   09/15/21 1000 09/15/21 1010  BP: (!) 156/90 (!) 157/90  Pulse: 91 90  Resp: 13 20  Temp:    SpO2: 95% 97%    Last Pain:  Vitals:   09/15/21 1010  TempSrc:   PainSc: 0-No pain                 Merik Mignano,W. EDMOND

## 2021-09-15 NOTE — Transfer of Care (Signed)
Immediate Anesthesia Transfer of Care Note  Patient: Dylan [name]  Procedure(s) Performed: CARDIOVERSION  Patient Location: Endoscopy Unit  Anesthesia Type:General  Level of Consciousness: drowsy and patient cooperative  Airway & Oxygen Therapy: Patient Spontanous Breathing  Post-op Assessment: Report given to RN  Post vital signs: Reviewed and stable  Last Vitals:  Vitals Value Taken Time  BP 173/103   Temp    Pulse 89   Resp 16   SpO2 98     Last Pain:  Vitals:   09/15/21 0814  TempSrc: Temporal  PainSc: 0-No pain         Complications: No notable events documented.

## 2021-09-16 ENCOUNTER — Encounter (HOSPITAL_COMMUNITY): Payer: Self-pay | Admitting: Cardiology

## 2021-09-28 ENCOUNTER — Telehealth (HOSPITAL_COMMUNITY): Payer: Self-pay

## 2021-09-28 ENCOUNTER — Other Ambulatory Visit (HOSPITAL_COMMUNITY): Payer: Self-pay | Admitting: Cardiology

## 2021-09-28 ENCOUNTER — Telehealth: Payer: Self-pay

## 2021-09-28 ENCOUNTER — Ambulatory Visit (INDEPENDENT_AMBULATORY_CARE_PROVIDER_SITE_OTHER): Payer: Medicare HMO

## 2021-09-28 DIAGNOSIS — I442 Atrioventricular block, complete: Secondary | ICD-10-CM

## 2021-09-28 LAB — CUP PACEART REMOTE DEVICE CHECK
Battery Remaining Longevity: 111 mo
Battery Voltage: 2.98 V
Brady Statistic AP VP Percent: 0.3 %
Brady Statistic AP VS Percent: 97.3 %
Brady Statistic AS VP Percent: 0.03 %
Brady Statistic AS VS Percent: 2.18 %
Brady Statistic RA Percent Paced: 8.27 %
Brady Statistic RV Percent Paced: 69.37 %
Date Time Interrogation Session: 20230209000530
Implantable Lead Implant Date: 20190216
Implantable Lead Implant Date: 20190216
Implantable Lead Location: 753859
Implantable Lead Location: 753860
Implantable Lead Model: 5076
Implantable Lead Model: 5076
Implantable Pulse Generator Implant Date: 20190216
Lead Channel Impedance Value: 285 Ohm
Lead Channel Impedance Value: 323 Ohm
Lead Channel Impedance Value: 380 Ohm
Lead Channel Impedance Value: 494 Ohm
Lead Channel Pacing Threshold Amplitude: 0.625 V
Lead Channel Pacing Threshold Amplitude: 1.125 V
Lead Channel Pacing Threshold Pulse Width: 0.4 ms
Lead Channel Pacing Threshold Pulse Width: 0.4 ms
Lead Channel Sensing Intrinsic Amplitude: 1.625 mV
Lead Channel Sensing Intrinsic Amplitude: 1.625 mV
Lead Channel Sensing Intrinsic Amplitude: 2.625 mV
Lead Channel Sensing Intrinsic Amplitude: 2.625 mV
Lead Channel Setting Pacing Amplitude: 1.75 V
Lead Channel Setting Pacing Amplitude: 2.5 V
Lead Channel Setting Pacing Pulse Width: 0.4 ms
Lead Channel Setting Sensing Sensitivity: 1.2 mV

## 2021-09-28 NOTE — Telephone Encounter (Signed)
Patients son called to report that according to the device clinic he has been in A-fib since 09/17/21. Marden Noble wants to know if he needs to bring his dad in soon then next week(10/05/21) when he has an appointment with you or if there is any medication changes that need to happen before then. Please advise.

## 2021-09-28 NOTE — Telephone Encounter (Signed)
Just have him come in next week.

## 2021-09-28 NOTE — Telephone Encounter (Signed)
This encounter was created in error - please disregard.

## 2021-09-28 NOTE — Telephone Encounter (Signed)
Scheduled remote reviewed. Normal device function.   Presenting rhythm AF, controlled ventricular rate, ongoing from 1/29 Burden 91.8%, Eliquis Successful DCCV 1/27, route to triage Next remote 91 days.  Spoke with patient son, (DPR on file).  Patient is asymptomatic.  He states they have an upcoming appt with Dr. Aundra Dubin that they will discuss long term management of this with.

## 2021-09-29 NOTE — Telephone Encounter (Signed)
Son returned call and is aware of advice.

## 2021-09-29 NOTE — Telephone Encounter (Signed)
Called son Marden Noble no answer/left vm for return call

## 2021-10-03 NOTE — Progress Notes (Signed)
Remote pacemaker transmission.   

## 2021-10-05 ENCOUNTER — Other Ambulatory Visit (HOSPITAL_COMMUNITY): Payer: Self-pay | Admitting: *Deleted

## 2021-10-05 ENCOUNTER — Other Ambulatory Visit: Payer: Self-pay

## 2021-10-05 ENCOUNTER — Ambulatory Visit (HOSPITAL_COMMUNITY)
Admission: RE | Admit: 2021-10-05 | Discharge: 2021-10-05 | Disposition: A | Discharge status: Home / Self Care | Payer: Medicare HMO | Source: Ambulatory Visit | Attending: Cardiology | Admitting: Cardiology

## 2021-10-05 ENCOUNTER — Encounter (HOSPITAL_COMMUNITY): Payer: Self-pay | Admitting: Cardiology

## 2021-10-05 VITALS — BP 120/70 | HR 76 | Wt 244.6 lb

## 2021-10-05 DIAGNOSIS — I5032 Chronic diastolic (congestive) heart failure: Secondary | ICD-10-CM | POA: Diagnosis not present

## 2021-10-05 DIAGNOSIS — E1122 Type 2 diabetes mellitus with diabetic chronic kidney disease: Secondary | ICD-10-CM | POA: Insufficient documentation

## 2021-10-05 DIAGNOSIS — I480 Paroxysmal atrial fibrillation: Secondary | ICD-10-CM | POA: Diagnosis not present

## 2021-10-05 DIAGNOSIS — I495 Sick sinus syndrome: Secondary | ICD-10-CM | POA: Diagnosis not present

## 2021-10-05 DIAGNOSIS — I130 Hypertensive heart and chronic kidney disease with heart failure and stage 1 through stage 4 chronic kidney disease, or unspecified chronic kidney disease: Secondary | ICD-10-CM | POA: Diagnosis not present

## 2021-10-05 DIAGNOSIS — I4819 Other persistent atrial fibrillation: Secondary | ICD-10-CM | POA: Diagnosis not present

## 2021-10-05 DIAGNOSIS — M4800 Spinal stenosis, site unspecified: Secondary | ICD-10-CM | POA: Insufficient documentation

## 2021-10-05 DIAGNOSIS — I951 Orthostatic hypotension: Secondary | ICD-10-CM | POA: Diagnosis not present

## 2021-10-05 LAB — COMPREHENSIVE METABOLIC PANEL
ALT: 9 U/L (ref 0–44)
AST: 13 U/L — ABNORMAL LOW (ref 15–41)
Albumin: 4.1 g/dL (ref 3.5–5.0)
Alkaline Phosphatase: 68 U/L (ref 38–126)
Anion gap: 10 (ref 5–15)
BUN: 13 mg/dL (ref 8–23)
CO2: 23 mmol/L (ref 22–32)
Calcium: 9.3 mg/dL (ref 8.9–10.3)
Chloride: 105 mmol/L (ref 98–111)
Creatinine, Ser: 1.25 mg/dL — ABNORMAL HIGH (ref 0.61–1.24)
GFR, Estimated: 54 mL/min — ABNORMAL LOW (ref >60)
Glucose, Bld: 90 mg/dL (ref 70–99)
Potassium: 3.8 mmol/L (ref 3.5–5.1)
Sodium: 138 mmol/L (ref 135–145)
Total Bilirubin: 1.4 mg/dL — ABNORMAL HIGH (ref 0.3–1.2)
Total Protein: 6.6 g/dL (ref 6.5–8.1)

## 2021-10-05 LAB — CBC
HCT: 40.6 % (ref 39.0–52.0)
Hemoglobin: 13.1 g/dL (ref 13.0–17.0)
MCH: 31 pg (ref 26.0–34.0)
MCHC: 32.3 g/dL (ref 30.0–36.0)
MCV: 96.2 fL (ref 80.0–100.0)
Platelets: 150 10*3/uL (ref 150–400)
RBC: 4.22 MIL/uL (ref 4.22–5.81)
RDW: 12.9 % (ref 11.5–15.5)
WBC: 7 10*3/uL (ref 4.0–10.5)
nRBC: 0 % (ref 0.0–0.2)

## 2021-10-05 LAB — TSH: TSH: 3.583 u[IU]/mL (ref 0.350–4.500)

## 2021-10-05 MED ORDER — POTASSIUM CHLORIDE CRYS ER 20 MEQ PO TBCR: 20.0000 meq | EXTENDED_RELEASE_TABLET | Freq: Every day | ORAL | 3 refills | Status: DC

## 2021-10-05 MED ORDER — FUROSEMIDE 40 MG PO TABS: ORAL_TABLET | ORAL | 3 refills | Status: DC

## 2021-10-05 NOTE — H&P (View-Only) (Signed)
ID:  Dylan [name], DOB 1926-07-22, MRN [id number]   Provider location: [place] [facility] Type of Visit: Established patient  PCP:  Haywood Pao, MD  Cardiologist:  Loralie Champagne, MD   History of Present Illness: Dylan [name] is a 88 y.o. with history of DM and HTN as well as atrial fibrillation and CHF.    Patient was doing well until 11/18 when he developed exertional dyspnea.  He went to the ER 07/14/17 and was found to be in atrial fibrillation with volume overload.  He was admitted overnight, diuresed and started on Eliquis, and discharged.  He remained in atrial fibrillation and short of breath.   He was re-hospitalized with dyspnea in 12/18.  TEE-guided DCCV to NSR was done and he was diuresed. TEE showed EF 50%.    He was noted to be back in atrial fibrillation and was started on amiodarone.  TEE-guided DCCV in 1/19 was done, with conversion back to NSR.   He was admitted in 2/19 with syncope and found to have tachy-brady syndrome with junctional bradycardia (symptomatic). Medtronic dual chamber PPM was placed.   He was readmitted in 2/19 with pneumonia.  During this admission, he was noted to be back in atrial fibrillation but converted back to NSR.   In 12/20, he developed COVID-19 pneumonitis and was hospitalized at [facility].  He developed orthostatic hypotension after COVID-19.  He was started on midodrine.   PYP scan in 9/21 was not suggestive of TTR cardiac amyloidosis.   Echo in 9/22 showed EF 55-60%, mild LVH (reviewed echo).   Patient was noted to be in atrial fibrillation in 1/23 after stopping amiodarone.  Amiodarone was restarted and he was cardioverted to NSR in 1/23.  However, he only stayed in NSR for about 2 days based on device interrogation and has been back in persistent atrial fibrillation.   He returns for followup of atrial fibrillation and CHF.  He cannot tell that he is in atrial fibrillation today.  Weight is  stable.  He has been more short of breath over the last couple of months.  He notes dyspnea walking about 200 feet, such as from his house to the car.  Still rides stationary bike for 3 miles on Mon/Wed/Fri. Uses weights also at the gym.   ECG (personally reviewed): Atrial fibrillation, rate 75  MDT device interrogation: Atrial fibrillation resumed 1-2 days post-DCCV in 1/23.    Labs (12/20): hgb 12.9, TSH normal, BNP 72, LFTs normal, K 3.9, creatinine 0.88 Labs (2/21): TSH normal, hgb 13, LDL 52, K 3.8, creatinine 1.03, LFTs normal Labs (6/21): hgb 13.2, TSH normal, K 4, creatinine 1.19, LFTs normal Labs (9/21): Myeloma panel and urine immunofixation negative, LFTs normal, K 4.1, creatinine 1.37 Labs (8/22): hgb 13.2, TSH normal, K 4.5, creatinine 1.22, LFTs normal.  Labs (10/22): K 3.9, creatinine 1.25 Labs (1/23): K 3.6, creatinine 1.06, hgb 12.6  ECG (personally reviewed): atrial flutter rate 79  PMH: 1. Chronic diastolic CHF: Echo (84/16) with EF 50-55%, mild-moderate LVH, mild AI, mild MR, normal RV size and systolic function, PASP 45 mmHg.  - TEE (12/18): EF 50%, normal RV size and systolic function, mild MR.  - TEE (1/19): EF 55-60%, mild LVH, mild MR, normal RV, aortic sclerosis without significant stenosis.  - PYP scan (9/21): grade 1, H/CL 1.03 => unlikely TTR cardiac amyloidosis.  - Echo (9/22): EF 55-60%, mild LVH (mild review) 2. Atrial fibrillation: Persistent, initially diagnosed in 11/18.  -  TEE-guided DCCV in 12/18.  - TEE-guided DCCV in 1/19 on amiodarone.  - DCCV 1/23 on amiodarone 3. Chest pain: LHC remotely without obstructive disease.  1/16 Cardiolite with no ischemia.  4. Type II diabetes 5. HTN 6. CKD: Stage 3.  7. Tachy-brady syndrome: Medtronic dual chamber.   8. Orthostatic hypotension.  9. COVID-19 pneumonitis 10. ABIs (6/19): Normal 11. Spinal stenosis  Social History   Socioeconomic History   Marital status: Married    Spouse name: Not on file    Number of children: Not on file   Years of education: Not on file   Highest education level: Not on file  Occupational History   Not on file  Tobacco Use   Smoking status: Former    Packs/day: 1.00    Types: Cigarettes    Quit date: 01/07/1992    Years since quitting: 29.7   Smokeless tobacco: Never  Substance and Sexual Activity   Alcohol use: Not Currently    Alcohol/week: 2.0 standard drinks    Types: 2 Glasses of wine per week   Drug use: No   Sexual activity: Yes  Other Topics Concern   Not on file  Social History Narrative   Not on file   Social Determinants of Health   Financial Resource Strain: Not on file  Food Insecurity: Not on file  Transportation Needs: Not on file  Physical Activity: Not on file  Stress: Not on file  Social Connections: Not on file  Intimate Partner Violence: Not on file   Family History  Problem Relation Age of Onset   Cancer - Other Mother    CVA Father    Heart attack Brother    Heart attack Maternal Grandmother    ROS: All systems reviewed and negative except as per HPI.  Current Outpatient Medications  Medication Sig Dispense Refill   acetaminophen (TYLENOL) 500 MG tablet Take 1,000 mg by mouth every 8 (eight) hours as needed for mild pain or headache.     amiodarone (PACERONE) 200 MG tablet Take 200 mg by mouth daily.     Cholecalciferol (VITAMIN D3) 50 MCG (2000 UT) TABS Take 1 tablet by mouth daily.     docusate sodium (COLACE) 100 MG capsule Take 100 mg by mouth daily.     ELIQUIS 5 MG TABS tablet TAKE 1 TABLET BY MOUTH TWICE DAILY . APPOINTMENT REQUIRED FOR FUTURE REFILLS 60 tablet 11   Menthol, Topical Analgesic, (BIOFREEZE ROLL-ON COLORLESS) 4 % GEL Apply 1 application topically daily as needed (pain).     midodrine (PROAMATINE) 5 MG tablet TAKE 1 TABLET BY MOUTH THREE TIMES DAILY 90 tablet 0   Omega-3 Fatty Acids (FISH OIL) 1200 MG CAPS Take 1,200 mg by mouth daily.      potassium chloride SA (KLOR-CON M) 20 MEQ tablet  Take 1 tablet (20 mEq total) by mouth daily. 90 tablet 3   rosuvastatin (CRESTOR) 10 MG tablet Take 1 tablet by mouth once daily 90 tablet 3   vitamin B-12 (CYANOCOBALAMIN) 1000 MCG tablet Take 1,000 mcg by mouth daily.     furosemide (LASIX) 40 MG tablet Take 1 tablet (40 mg total) by mouth every morning AND 0.5 tablets (20 mg total) every evening. 135 tablet 3   No current facility-administered medications for this encounter.   BP 120/70    Pulse 76    Wt 110.9 kg (244 lb 9.6 oz)    SpO2 96%    BMI 33.17 kg/m  General: NAD Neck: JVP  8-9 cm with HJR, no thyromegaly or thyroid nodule.  Lungs: Clear to auscultation bilaterally with normal respiratory effort. CV: Nondisplaced PMI.  Heart irregular S1/S2, no S3/S4, 2/6 SEM RUSB.  1+ edema to the knees bilaterally.  No carotid bruit.  Normal pedal pulses.  Abdomen: Soft, nontender, no hepatosplenomegaly, no distention.  Skin: Intact without lesions or rashes.  Neurologic: Alert and oriented x 3.  Psych: Normal affect. Extremities: No clubbing or cyanosis.  HEENT: Normal.   Assessment/Plan: 1. Atrial fibrillation: DCCV in 12/18.  Recurrence of atrial fibrillation with initiation of amiodarone then TEE-guided DCCV in 1/19.  Amiodarone stopped fall of 2022, back in AF in 12/22.  Amiodarone restarted and he had DCCV to NSR in 1/23 but atrial fibrillation recurred < 2 days despite amiodarone.  He remains in atrial fibrillation today.  Volume overload and symptoms have been worse in atrial fibrillation though he denies palpitations.  - We had a long discussion about whether to stop amiodarone and leave him in AF given such a quick recurrence despite amiodarone use or to try 1 more DCCV.  He wants to try cardioversion 1 more time.  He has not missed any apixaban.  I will arrange for DCCV next week.  - Increase amiodarone to 200 mg bid until DCCV.  Check LFTs and TSH today, will need regular eye exam.     - Continue apixaban, he has not missed doses. 2.  CKD: BMET today.   3. Chronic diastolic CHF: EF 50-56% from 1/19 TEE.  Prior workup negative for cardiac amyloidosis.  Echo in 9/22 with mild LVH, EF 55-60% (my review).  NYHA class III symptoms, worse recently since he has been in atrial fibrillation.  He is volume overloaded on exam.  I suspect that CHF exacerbation is related to the onset of persistent atrial fibrillation.  - Increase Lasix to 40 mg bid x 3 days then 40 qam/20 qpm.  BMET today and in 10 days.  - Add KDur 20 mEq daily.  - Hopefully resumption of NSR will help.  4. Orthostatic hypotension: This has been present since his COVID-19 infection and may be related.  This seems to be improved.  - He remains on low dose midodrine.   Followup in 1 month.    Signed, Loralie Champagne, MD  10/05/2021  [place] [address] Heart and [place] [address] [phone] (office) [phone] (fax)

## 2021-10-05 NOTE — Patient Instructions (Signed)
Increase Furosemide to 40 mg Twice daily FOR 3 DAYS ONLY, then take 40 mg in AM and 20 mg (1/2 tab) in PM  Increase Amiodarone to 200 mg Twice daily FOR 1 WEEK, then back to 200 mg Daily  Start Potassium (k-dur) 20 meq Daily  Labs done today, your results will be available in MyChart, we will contact you for abnormal readings.  Your physician recommends that you return for lab work in: 10 days  Your physician has recommended that you have a Cardioversion (DCCV). Electrical Cardioversion uses a jolt of electricity to your heart either through paddles or wired patches attached to your chest. This is a controlled, usually prescheduled, procedure. Defibrillation is done under light anesthesia in the hospital, and you usually go home the day of the procedure. This is done to get your heart back into a normal rhythm. You are not awake for the procedure. Please see the instruction sheet given to you today. SEE INSTRUCTIONS BELOW  Your physician recommends that you schedule a follow-up appointment in: 1 month  If you have any questions or concerns before your next appointment please send [place] a message through [place] or call our office at [phone].    TO LEAVE A MESSAGE FOR THE NURSE SELECT OPTION 2, PLEASE LEAVE A MESSAGE INCLUDING: YOUR NAME DATE OF BIRTH CALL BACK NUMBER REASON FOR CALL**this is important as we prioritize the call backs  YOU WILL RECEIVE A CALL BACK THE SAME DAY AS LONG AS YOU CALL BEFORE 4:00 PM  At the [facility], you and your health needs are our priority. As part of our continuing mission to provide you with exceptional heart care, we have created designated Provider Care Teams. These Care Teams include your primary Cardiologist (physician) and Advanced Practice Providers (APPs- Physician Assistants and Nurse Practitioners) who all work together to provide you with the care you need, when you need it.   You may see any of the following providers on your  designated Care Team at your next follow up: Dr Glori Bickers Dr Haynes Kerns, NP Lyda Jester, [facility] [place], [place] Audry Riles, PharmD   Please be sure to bring in all your medications bottles to every appointment.     CARDIOVERSION INSTRUCTIONS:  You are scheduled for a Cardioversion on Monday 10/09/21 with Dr. Aundra Dubin.  Please arrive at the [facility] (Main Entrance A) at [facility]: [address] [place], [place] [address] at 12:00 pm.   DIET: Nothing to eat or drink after midnight except a sip of water with medications (see medication instructions below)  FYI: For your safety, and to allow [place] to monitor your vital signs accurately during the surgery/procedure we request that   if you have artificial nails, gel coating, SNS etc. Please have those removed prior to your surgery/procedure. Not having the nail coverings /polish removed may result in cancellation or delay of your surgery/procedure.   Medication Instructions:  Monday 2/20: DO NOT TAKE Furosemide   MAKE SURE TO TAKE your Eliquis and Amiodarone   You must have a responsible person to drive you home and stay in the waiting area during your procedure. Failure to do so could result in cancellation.  Bring your insurance cards.  *Special Note: Every effort is made to have your procedure done on time. Occasionally there are emergencies that occur at the hospital that may cause delays. Please be patient if a delay does occur.

## 2021-10-05 NOTE — Progress Notes (Signed)
ID:  Dylan [name], DOB 11/17/1925, MRN [id number]   Provider location: [place] [facility] Type of Visit: Established patient  PCP:  Haywood Pao, MD  Cardiologist:  Loralie Champagne, MD   History of Present Illness: Dylan [name] is a 88 y.o. with history of DM and HTN as well as atrial fibrillation and CHF.    Patient was doing well until 11/18 when he developed exertional dyspnea.  He went to the ER 07/14/17 and was found to be in atrial fibrillation with volume overload.  He was admitted overnight, diuresed and started on Eliquis, and discharged.  He remained in atrial fibrillation and short of breath.   He was re-hospitalized with dyspnea in 12/18.  TEE-guided DCCV to NSR was done and he was diuresed. TEE showed EF 50%.    He was noted to be back in atrial fibrillation and was started on amiodarone.  TEE-guided DCCV in 1/19 was done, with conversion back to NSR.   He was admitted in 2/19 with syncope and found to have tachy-brady syndrome with junctional bradycardia (symptomatic). Medtronic dual chamber PPM was placed.   He was readmitted in 2/19 with pneumonia.  During this admission, he was noted to be back in atrial fibrillation but converted back to NSR.   In 12/20, he developed COVID-19 pneumonitis and was hospitalized at [facility].  He developed orthostatic hypotension after COVID-19.  He was started on midodrine.   PYP scan in 9/21 was not suggestive of TTR cardiac amyloidosis.   Echo in 9/22 showed EF 55-60%, mild LVH (reviewed echo).   Patient was noted to be in atrial fibrillation in 1/23 after stopping amiodarone.  Amiodarone was restarted and he was cardioverted to NSR in 1/23.  However, he only stayed in NSR for about 2 days based on device interrogation and has been back in persistent atrial fibrillation.   He returns for followup of atrial fibrillation and CHF.  He cannot tell that he is in atrial fibrillation today.  Weight is  stable.  He has been more short of breath over the last couple of months.  He notes dyspnea walking about 200 feet, such as from his house to the car.  Still rides stationary bike for 3 miles on Mon/Wed/Fri. Uses weights also at the gym.   ECG (personally reviewed): Atrial fibrillation, rate 75  MDT device interrogation: Atrial fibrillation resumed 1-2 days post-DCCV in 1/23.    Labs (12/20): hgb 12.9, TSH normal, BNP 72, LFTs normal, K 3.9, creatinine 0.88 Labs (2/21): TSH normal, hgb 13, LDL 52, K 3.8, creatinine 1.03, LFTs normal Labs (6/21): hgb 13.2, TSH normal, K 4, creatinine 1.19, LFTs normal Labs (9/21): Myeloma panel and urine immunofixation negative, LFTs normal, K 4.1, creatinine 1.37 Labs (8/22): hgb 13.2, TSH normal, K 4.5, creatinine 1.22, LFTs normal.  Labs (10/22): K 3.9, creatinine 1.25 Labs (1/23): K 3.6, creatinine 1.06, hgb 12.6  ECG (personally reviewed): atrial flutter rate 79  PMH: 1. Chronic diastolic CHF: Echo (17/49) with EF 50-55%, mild-moderate LVH, mild AI, mild MR, normal RV size and systolic function, PASP 45 mmHg.  - TEE (12/18): EF 50%, normal RV size and systolic function, mild MR.  - TEE (1/19): EF 55-60%, mild LVH, mild MR, normal RV, aortic sclerosis without significant stenosis.  - PYP scan (9/21): grade 1, H/CL 1.03 => unlikely TTR cardiac amyloidosis.  - Echo (9/22): EF 55-60%, mild LVH (mild review) 2. Atrial fibrillation: Persistent, initially diagnosed in 11/18.  -  TEE-guided DCCV in 12/18.  - TEE-guided DCCV in 1/19 on amiodarone.  - DCCV 1/23 on amiodarone 3. Chest pain: LHC remotely without obstructive disease.  1/16 Cardiolite with no ischemia.  4. Type II diabetes 5. HTN 6. CKD: Stage 3.  7. Tachy-brady syndrome: Medtronic dual chamber.   8. Orthostatic hypotension.  9. COVID-19 pneumonitis 10. ABIs (6/19): Normal 11. Spinal stenosis  Social History   Socioeconomic History   Marital status: Married    Spouse name: Not on file    Number of children: Not on file   Years of education: Not on file   Highest education level: Not on file  Occupational History   Not on file  Tobacco Use   Smoking status: Former    Packs/day: 1.00    Types: Cigarettes    Quit date: 01/07/1992    Years since quitting: 29.7   Smokeless tobacco: Never  Substance and Sexual Activity   Alcohol use: Not Currently    Alcohol/week: 2.0 standard drinks    Types: 2 Glasses of wine per week   Drug use: No   Sexual activity: Yes  Other Topics Concern   Not on file  Social History Narrative   Not on file   Social Determinants of Health   Financial Resource Strain: Not on file  Food Insecurity: Not on file  Transportation Needs: Not on file  Physical Activity: Not on file  Stress: Not on file  Social Connections: Not on file  Intimate Partner Violence: Not on file   Family History  Problem Relation Age of Onset   Cancer - Other Mother    CVA Father    Heart attack Brother    Heart attack Maternal Grandmother    ROS: All systems reviewed and negative except as per HPI.  Current Outpatient Medications  Medication Sig Dispense Refill   acetaminophen (TYLENOL) 500 MG tablet Take 1,000 mg by mouth every 8 (eight) hours as needed for mild pain or headache.     amiodarone (PACERONE) 200 MG tablet Take 200 mg by mouth daily.     Cholecalciferol (VITAMIN D3) 50 MCG (2000 UT) TABS Take 1 tablet by mouth daily.     docusate sodium (COLACE) 100 MG capsule Take 100 mg by mouth daily.     ELIQUIS 5 MG TABS tablet TAKE 1 TABLET BY MOUTH TWICE DAILY . APPOINTMENT REQUIRED FOR FUTURE REFILLS 60 tablet 11   Menthol, Topical Analgesic, (BIOFREEZE ROLL-ON COLORLESS) 4 % GEL Apply 1 application topically daily as needed (pain).     midodrine (PROAMATINE) 5 MG tablet TAKE 1 TABLET BY MOUTH THREE TIMES DAILY 90 tablet 0   Omega-3 Fatty Acids (FISH OIL) 1200 MG CAPS Take 1,200 mg by mouth daily.      potassium chloride SA (KLOR-CON M) 20 MEQ tablet  Take 1 tablet (20 mEq total) by mouth daily. 90 tablet 3   rosuvastatin (CRESTOR) 10 MG tablet Take 1 tablet by mouth once daily 90 tablet 3   vitamin B-12 (CYANOCOBALAMIN) 1000 MCG tablet Take 1,000 mcg by mouth daily.     furosemide (LASIX) 40 MG tablet Take 1 tablet (40 mg total) by mouth every morning AND 0.5 tablets (20 mg total) every evening. 135 tablet 3   No current facility-administered medications for this encounter.   BP 120/70    Pulse 76    Wt 110.9 kg (244 lb 9.6 oz)    SpO2 96%    BMI 33.17 kg/m  General: NAD Neck: JVP  8-9 cm with HJR, no thyromegaly or thyroid nodule.  Lungs: Clear to auscultation bilaterally with normal respiratory effort. CV: Nondisplaced PMI.  Heart irregular S1/S2, no S3/S4, 2/6 SEM RUSB.  1+ edema to the knees bilaterally.  No carotid bruit.  Normal pedal pulses.  Abdomen: Soft, nontender, no hepatosplenomegaly, no distention.  Skin: Intact without lesions or rashes.  Neurologic: Alert and oriented x 3.  Psych: Normal affect. Extremities: No clubbing or cyanosis.  HEENT: Normal.   Assessment/Plan: 1. Atrial fibrillation: DCCV in 12/18.  Recurrence of atrial fibrillation with initiation of amiodarone then TEE-guided DCCV in 1/19.  Amiodarone stopped fall of 2022, back in AF in 12/22.  Amiodarone restarted and he had DCCV to NSR in 1/23 but atrial fibrillation recurred < 2 days despite amiodarone.  He remains in atrial fibrillation today.  Volume overload and symptoms have been worse in atrial fibrillation though he denies palpitations.  - We had a long discussion about whether to stop amiodarone and leave him in AF given such a quick recurrence despite amiodarone use or to try 1 more DCCV.  He wants to try cardioversion 1 more time.  He has not missed any apixaban.  I will arrange for DCCV next week.  - Increase amiodarone to 200 mg bid until DCCV.  Check LFTs and TSH today, will need regular eye exam.     - Continue apixaban, he has not missed doses. 2.  CKD: BMET today.   3. Chronic diastolic CHF: EF 01-77% from 1/19 TEE.  Prior workup negative for cardiac amyloidosis.  Echo in 9/22 with mild LVH, EF 55-60% (my review).  NYHA class III symptoms, worse recently since he has been in atrial fibrillation.  He is volume overloaded on exam.  I suspect that CHF exacerbation is related to the onset of persistent atrial fibrillation.  - Increase Lasix to 40 mg bid x 3 days then 40 qam/20 qpm.  BMET today and in 10 days.  - Add KDur 20 mEq daily.  - Hopefully resumption of NSR will help.  4. Orthostatic hypotension: This has been present since his COVID-19 infection and may be related.  This seems to be improved.  - He remains on low dose midodrine.   Followup in 1 month.    Signed, Loralie Champagne, MD  10/05/2021  [place] [address] Heart and [place] [address] [phone] (office) [phone] (fax)

## 2021-10-12 ENCOUNTER — Other Ambulatory Visit: Payer: Self-pay

## 2021-10-12 ENCOUNTER — Encounter (HOSPITAL_COMMUNITY)
Admission: RE | Disposition: A | Discharge status: Home / Self Care | Payer: Self-pay | Source: Ambulatory Visit | Attending: Cardiology

## 2021-10-12 ENCOUNTER — Ambulatory Visit (HOSPITAL_COMMUNITY): Payer: Medicare HMO | Admitting: Anesthesiology

## 2021-10-12 ENCOUNTER — Ambulatory Visit (HOSPITAL_COMMUNITY)
Admission: RE | Admit: 2021-10-12 | Discharge: 2021-10-12 | Disposition: A | Discharge status: Home / Self Care | Payer: Medicare HMO | Source: Ambulatory Visit | Attending: Cardiology | Admitting: Cardiology

## 2021-10-12 ENCOUNTER — Ambulatory Visit (HOSPITAL_BASED_OUTPATIENT_CLINIC_OR_DEPARTMENT_OTHER): Payer: Medicare HMO | Admitting: Anesthesiology

## 2021-10-12 DIAGNOSIS — I252 Old myocardial infarction: Secondary | ICD-10-CM | POA: Diagnosis not present

## 2021-10-12 DIAGNOSIS — I4819 Other persistent atrial fibrillation: Secondary | ICD-10-CM | POA: Insufficient documentation

## 2021-10-12 DIAGNOSIS — Z8616 Personal history of COVID-19: Secondary | ICD-10-CM | POA: Diagnosis not present

## 2021-10-12 DIAGNOSIS — I130 Hypertensive heart and chronic kidney disease with heart failure and stage 1 through stage 4 chronic kidney disease, or unspecified chronic kidney disease: Secondary | ICD-10-CM | POA: Insufficient documentation

## 2021-10-12 DIAGNOSIS — E119 Type 2 diabetes mellitus without complications: Secondary | ICD-10-CM

## 2021-10-12 DIAGNOSIS — I5032 Chronic diastolic (congestive) heart failure: Secondary | ICD-10-CM | POA: Insufficient documentation

## 2021-10-12 DIAGNOSIS — Z7901 Long term (current) use of anticoagulants: Secondary | ICD-10-CM | POA: Insufficient documentation

## 2021-10-12 DIAGNOSIS — I480 Paroxysmal atrial fibrillation: Secondary | ICD-10-CM

## 2021-10-12 DIAGNOSIS — I951 Orthostatic hypotension: Secondary | ICD-10-CM | POA: Insufficient documentation

## 2021-10-12 DIAGNOSIS — I10 Essential (primary) hypertension: Secondary | ICD-10-CM

## 2021-10-12 DIAGNOSIS — Z79899 Other long term (current) drug therapy: Secondary | ICD-10-CM | POA: Insufficient documentation

## 2021-10-12 DIAGNOSIS — Z950 Presence of cardiac pacemaker: Secondary | ICD-10-CM | POA: Insufficient documentation

## 2021-10-12 DIAGNOSIS — I4891 Unspecified atrial fibrillation: Secondary | ICD-10-CM | POA: Diagnosis not present

## 2021-10-12 DIAGNOSIS — Z87891 Personal history of nicotine dependence: Secondary | ICD-10-CM | POA: Diagnosis not present

## 2021-10-12 DIAGNOSIS — E1122 Type 2 diabetes mellitus with diabetic chronic kidney disease: Secondary | ICD-10-CM | POA: Insufficient documentation

## 2021-10-12 DIAGNOSIS — N183 Chronic kidney disease, stage 3 unspecified: Secondary | ICD-10-CM | POA: Insufficient documentation

## 2021-10-12 HISTORY — PX: CARDIOVERSION: SHX1299

## 2021-10-12 LAB — GLUCOSE, CAPILLARY: Glucose-Capillary: 88 mg/dL (ref 70–99)

## 2021-10-12 SURGERY — CARDIOVERSION
Anesthesia: General

## 2021-10-12 MED ORDER — LIDOCAINE 2% (20 MG/ML) 5 ML SYRINGE
INTRAMUSCULAR | Status: DC | PRN
  Administered 2021-10-12: 20 mg via INTRAVENOUS

## 2021-10-12 MED ORDER — PROPOFOL 10 MG/ML IV BOLUS
INTRAVENOUS | Status: DC | PRN
  Administered 2021-10-12: 50 mg via INTRAVENOUS
  Administered 2021-10-12: 20 mg via INTRAVENOUS

## 2021-10-12 MED ORDER — SODIUM CHLORIDE 0.9 % IV SOLN: INTRAVENOUS | Status: DC

## 2021-10-12 MED ORDER — SODIUM CHLORIDE 0.9 % IV SOLN
INTRAVENOUS | Status: DC
Start: 2021-10-12 — End: 2021-10-12

## 2021-10-12 NOTE — Interval H&P Note (Signed)
History and Physical Interval Note:  10/12/2021 8:40 AM  Dylan [name]  has presented today for surgery, with the diagnosis of afib.  The various methods of treatment have been discussed with the patient and family. After consideration of risks, benefits and other options for treatment, the patient has consented to  Procedure(s): CARDIOVERSION (N/A) as a surgical intervention.  The patient's history has been reviewed, patient examined, no change in status, stable for surgery.  I have reviewed the patient's chart and labs.  Questions were answered to the patient's satisfaction.     Talma Aguillard [employer]

## 2021-10-12 NOTE — Anesthesia Preprocedure Evaluation (Addendum)
Anesthesia Evaluation   Patient awake    Reviewed: Allergy & Precautions, H&P , NPO status , Patient's Chart, lab work & pertinent test results  Airway Mallampati: I  TM Distance: >3 FB Neck ROM: Full    Dental no notable dental hx. (+) Upper Dentures, Dental Advisory Given, Edentulous Upper   Pulmonary shortness of breath and with exertion, former smoker,    Pulmonary exam normal breath sounds clear to auscultation       Cardiovascular Exercise Tolerance: Good hypertension, Pt. on medications + Past MI and + DOE  + dysrhythmias Atrial Fibrillation + pacemaker  Rhythm:Irregular Rate:Normal     Neuro/Psych negative neurological ROS  negative psych ROS   GI/Hepatic negative GI ROS, Neg liver ROS,   Endo/Other  diabetes  Renal/GU negative Renal ROS  negative genitourinary   Musculoskeletal   Abdominal   Peds  Hematology negative hematology ROS (+)   Anesthesia Other Findings   Reproductive/Obstetrics negative OB ROS                            Anesthesia Physical  Anesthesia Plan  ASA: 3  Anesthesia Plan: General   Post-op Pain Management: Minimal or no pain anticipated   Induction: Intravenous  PONV Risk Score and Plan: 2 and Propofol infusion and Treatment may vary due to age or medical condition  Airway Management Planned: Mask and Natural Airway  Additional Equipment:   Intra-op Plan:   Post-operative Plan:   Informed Consent: I have reviewed the patients History and Physical, chart, labs and discussed the procedure including the risks, benefits and alternatives for the proposed anesthesia with the patient or authorized representative who has indicated his/her understanding and acceptance.     Dental advisory given  Plan Discussed with: CRNA and Anesthesiologist  Anesthesia Plan Comments:        Anesthesia Quick Evaluation

## 2021-10-12 NOTE — Anesthesia Postprocedure Evaluation (Signed)
Anesthesia Post Note  Patient: Dylan [name]  Procedure(s) Performed: CARDIOVERSION     Patient location during evaluation: Endoscopy Anesthesia Type: General Level of consciousness: awake Pain management: pain level controlled Vital Signs Assessment: post-procedure vital signs reviewed and stable Respiratory status: spontaneous breathing Cardiovascular status: stable Postop Assessment: no apparent nausea or vomiting Anesthetic complications: no   No notable events documented.  Last Vitals:  Vitals:   10/12/21 0924 10/12/21 0927  BP: (!) 167/76 (!) 165/76  Pulse: (!) 59 60  Resp: 14 12  Temp:    SpO2: 97% 96%    Last Pain:  Vitals:   10/12/21 0927  TempSrc:   PainSc: 0-No pain                 [name]

## 2021-10-12 NOTE — Transfer of Care (Signed)
Immediate Anesthesia Transfer of Care Note  Patient: Dylan [name]  Procedure(s) Performed: CARDIOVERSION  Patient Location: Endoscopy Unit  Anesthesia Type:General  Level of Consciousness: awake, alert  and oriented  Airway & Oxygen Therapy: Patient Spontanous Breathing  Post-op Assessment: Report given to RN and Post -op Vital signs reviewed and stable  Post vital signs: Reviewed and stable  Last Vitals:  Vitals Value Taken Time  BP    Temp    Pulse    Resp    SpO2      Last Pain:  Vitals:   10/12/21 0726  TempSrc: Oral  PainSc: 0-No pain         Complications: No notable events documented.

## 2021-10-12 NOTE — Discharge Instructions (Signed)

## 2021-10-12 NOTE — Procedures (Signed)
Electrical Cardioversion Procedure Note KRISTOF NADEEM [id number] [birth year]  Procedure: Electrical Cardioversion Indications:  Atrial Fibrillation  Procedure Details Consent: Risks of procedure as well as the alternatives and risks of each were explained to the (patient/caregiver).  Consent for procedure obtained. Time Out: Verified patient identification, verified procedure, site/side was marked, verified correct patient position, special equipment/implants available, medications/allergies/relevent history reviewed, required imaging and test results available.  Performed  Patient placed on cardiac monitor, pulse oximetry, supplemental oxygen as necessary.  Sedation given:  Propofol per anesthesiology Pacer pads placed anterior and posterior chest.  Cardioverted 1 time(s).  Cardioverted at [place].  Evaluation Findings: Post procedure EKG shows: NSR Complications: None Patient did tolerate procedure well.   Loralie Champagne 10/12/2021, 8:56 AM

## 2021-10-12 NOTE — Anesthesia Procedure Notes (Signed)
Procedure Name: General with mask airway Date/Time: 10/12/2021 8:44 AM Performed by: Mariea Clonts, CRNA Pre-anesthesia Checklist: Timeout performed, Patient being monitored, Suction available, Emergency Drugs available and Patient identified Patient Re-evaluated:Patient Re-evaluated prior to induction Oxygen Delivery Method: Ambu bag Preoxygenation: Pre-oxygenation with 100% oxygen Induction Type: IV induction Ventilation: Mask ventilation without difficulty

## 2021-10-15 ENCOUNTER — Encounter (HOSPITAL_COMMUNITY): Payer: Self-pay | Admitting: Cardiology

## 2021-11-01 ENCOUNTER — Other Ambulatory Visit: Payer: Self-pay | Admitting: Internal Medicine

## 2021-11-02 ENCOUNTER — Ambulatory Visit (HOSPITAL_COMMUNITY)
Admission: RE | Admit: 2021-11-02 | Discharge: 2021-11-02 | Disposition: A | Discharge status: Home / Self Care | Payer: Medicare HMO | Source: Ambulatory Visit | Attending: Cardiology | Admitting: Cardiology

## 2021-11-02 ENCOUNTER — Encounter (HOSPITAL_COMMUNITY): Payer: Self-pay | Admitting: Cardiology

## 2021-11-02 ENCOUNTER — Other Ambulatory Visit: Payer: Self-pay

## 2021-11-02 VITALS — BP 122/78 | HR 76 | Wt 236.2 lb

## 2021-11-02 DIAGNOSIS — Z79899 Other long term (current) drug therapy: Secondary | ICD-10-CM | POA: Diagnosis not present

## 2021-11-02 DIAGNOSIS — I130 Hypertensive heart and chronic kidney disease with heart failure and stage 1 through stage 4 chronic kidney disease, or unspecified chronic kidney disease: Secondary | ICD-10-CM | POA: Insufficient documentation

## 2021-11-02 DIAGNOSIS — E1122 Type 2 diabetes mellitus with diabetic chronic kidney disease: Secondary | ICD-10-CM | POA: Insufficient documentation

## 2021-11-02 DIAGNOSIS — R0602 Shortness of breath: Secondary | ICD-10-CM | POA: Insufficient documentation

## 2021-11-02 DIAGNOSIS — I4819 Other persistent atrial fibrillation: Secondary | ICD-10-CM | POA: Insufficient documentation

## 2021-11-02 DIAGNOSIS — N183 Chronic kidney disease, stage 3 unspecified: Secondary | ICD-10-CM | POA: Diagnosis not present

## 2021-11-02 DIAGNOSIS — I5032 Chronic diastolic (congestive) heart failure: Secondary | ICD-10-CM | POA: Insufficient documentation

## 2021-11-02 DIAGNOSIS — Z7901 Long term (current) use of anticoagulants: Secondary | ICD-10-CM | POA: Insufficient documentation

## 2021-11-02 DIAGNOSIS — I951 Orthostatic hypotension: Secondary | ICD-10-CM | POA: Diagnosis not present

## 2021-11-02 DIAGNOSIS — Z09 Encounter for follow-up examination after completed treatment for conditions other than malignant neoplasm: Secondary | ICD-10-CM | POA: Insufficient documentation

## 2021-11-02 DIAGNOSIS — Z8616 Personal history of COVID-19: Secondary | ICD-10-CM | POA: Insufficient documentation

## 2021-11-02 LAB — BASIC METABOLIC PANEL
Anion gap: 12 (ref 5–15)
BUN: 19 mg/dL (ref 8–23)
CO2: 27 mmol/L (ref 22–32)
Calcium: 9.4 mg/dL (ref 8.9–10.3)
Chloride: 99 mmol/L (ref 98–111)
Creatinine, Ser: 1.33 mg/dL — ABNORMAL HIGH (ref 0.61–1.24)
GFR, Estimated: 50 mL/min — ABNORMAL LOW (ref >60)
Glucose, Bld: 83 mg/dL (ref 70–99)
Potassium: 4.2 mmol/L (ref 3.5–5.1)
Sodium: 138 mmol/L (ref 135–145)

## 2021-11-02 MED ORDER — METOPROLOL SUCCINATE ER 25 MG PO TB24: 25.0000 mg | ORAL_TABLET | Freq: Every day | ORAL | 1 refills | Status: DC

## 2021-11-02 NOTE — Patient Instructions (Signed)
Good to see you today! ? ?Stop Amiodarone  ? ?START Toprol XL 25 mg daily ? ?Labs done today, your results will be available in MyChart, we will contact you for abnormal readings. ? ? ?Your physician recommends that you schedule a follow-up appointment in: 4 months ?Call our office in May for July appointment ? ?If you have any questions or concerns before your next appointment please send [place] a message through [place] or call our office at [phone].   ? ?TO LEAVE A MESSAGE FOR THE NURSE SELECT OPTION 2, PLEASE LEAVE A MESSAGE INCLUDING: ?YOUR NAME ?DATE OF BIRTH ?CALL BACK NUMBER ?REASON FOR CALL**this is important as we prioritize the call backs ? ?YOU WILL RECEIVE A CALL BACK THE SAME DAY AS LONG AS YOU CALL BEFORE 4:00 PM ? ?At the [facility], you and your health needs are our priority. As part of our continuing mission to provide you with exceptional heart care, we have created designated Provider Care Teams. These Care Teams include your primary Cardiologist (physician) and Advanced Practice Providers (APPs- Physician Assistants and Nurse Practitioners) who all work together to provide you with the care you need, when you need it.  ? ?You may see any of the following providers on your designated Care Team at your next follow up: ?Dr Glori Bickers ?Dr Loralie Champagne ?Darrick Grinder, NP ?Lyda Jester, PA ?Jessica Milford,NP ?Marlyce Huge, PA ?Audry Riles, PharmD ? ? ?Please be sure to bring in all your medications bottles to every appointment.  ? ? ?

## 2021-11-02 NOTE — Progress Notes (Signed)
? ?ID:  Dylan [name], DOB 1926/04/23, MRN [id number]   ?Provider location: [facility] ?Type of Visit: Established patient ? ?PCP:  Tisovec, Fransico Him, MD  ?Cardiologist:  Loralie Champagne, MD ?  ?History of Present Illness: ?Dylan [name] is a 88 y.o. with history of DM and HTN as well as atrial fibrillation and CHF.  ?  ?Patient was doing well until 11/18 when he developed exertional dyspnea.  He went to the ER 07/14/17 and was found to be in atrial fibrillation with volume overload.  He was admitted overnight, diuresed and started on Eliquis, and discharged.  He remained in atrial fibrillation and short of breath.  ? ?He was re-hospitalized with dyspnea in 12/18.  TEE-guided DCCV to NSR was done and he was diuresed. TEE showed EF 50%.   ? ?He was noted to be back in atrial fibrillation and was started on amiodarone.  TEE-guided DCCV in 1/19 was done, with conversion back to NSR.  ? ?He was admitted in 2/19 with syncope and found to have tachy-brady syndrome with junctional bradycardia (symptomatic). Medtronic dual chamber PPM was placed.  ? ?He was readmitted in 2/19 with pneumonia.  During this admission, he was noted to be back in atrial fibrillation but converted back to NSR.  ? ?In 12/20, he developed COVID-19 pneumonitis and was hospitalized at [facility].  He developed orthostatic hypotension after COVID-19.  He was started on midodrine.  ? ?PYP scan in 9/21 was not suggestive of TTR cardiac amyloidosis.  ? ?Echo in 9/22 showed EF 55-60%, mild LVH (reviewed echo).  ? ?Patient was noted to be in atrial fibrillation in 1/23 after stopping amiodarone.  Amiodarone was restarted and he was cardioverted to NSR in 1/23.  However, he only stayed in NSR for about 2 days based on device interrogation and has been back in persistent atrial fibrillation. He had DCCV again to NSR in 2/23 but was back in AF a couple of days later.  ? ?He returns for followup of atrial fibrillation and  CHF.  He cannot tell that he is in atrial fibrillation today.  Weight is down 8 lbs on higher Lasix dose.  He is short of breath after walking 200-300 feet.  He still rides an exercise bike for 3 miles several days a week and works out with a Clinical research associate.  No orthopnea/PND.  No chest pain.   ? ?ECG (personally reviewed): Atrial fibrillation, rate 76 ? ?Labs (12/20): hgb 12.9, TSH normal, BNP 72, LFTs normal, K 3.9, creatinine 0.88 ?Labs (2/21): TSH normal, hgb 13, LDL 52, K 3.8, creatinine 1.03, LFTs normal ?Labs (6/21): hgb 13.2, TSH normal, K 4, creatinine 1.19, LFTs normal ?Labs (9/21): Myeloma panel and urine immunofixation negative, LFTs normal, K 4.1, creatinine 1.37 ?Labs (8/22): hgb 13.2, TSH normal, K 4.5, creatinine 1.22, LFTs normal.  ?Labs (10/22): K 3.9, creatinine 1.25 ?Labs (1/23): K 3.6, creatinine 1.06, hgb 12.6 ?Labs (2/23): K 3.8, creatinine 1.25, hgb 13.1 ? ? ?PMH: ?1. Chronic diastolic CHF: Echo (78/67) with EF 50-55%, mild-moderate LVH, mild AI, mild MR, normal RV size and systolic function, PASP 45 mmHg.  ?- TEE (12/18): EF 50%, normal RV size and systolic function, mild MR.  ?- TEE (1/19): EF 55-60%, mild LVH, mild MR, normal RV, aortic sclerosis without significant stenosis.  ?- PYP scan (9/21): grade 1, H/CL 1.03 => unlikely TTR cardiac amyloidosis.  ?- Echo (9/22): EF 55-60%, mild LVH (mild review) ?2. Atrial fibrillation: Persistent, initially  diagnosed in 11/18.  ?- TEE-guided DCCV in 12/18.  ?- TEE-guided DCCV in 1/19 on amiodarone.  ?- DCCV 1/23 on amiodarone ?- DCCV 2/23  ?3. Chest pain: LHC remotely without obstructive disease.  1/16 Cardiolite with no ischemia.  ?4. Type II diabetes ?5. HTN ?6. CKD: Stage 3.  ?7. Tachy-brady syndrome: Medtronic dual chamber.   ?8. Orthostatic hypotension.  ?9. COVID-19 pneumonitis ?10. ABIs (6/19): Normal ?11. Spinal stenosis ? ?Social History  ? ?Socioeconomic History  ? Marital status: Married  ?  Spouse name: Not on file  ? Number of children: Not on  file  ? Years of education: Not on file  ? Highest education level: Not on file  ?Occupational History  ? Not on file  ?Tobacco Use  ? Smoking status: Former  ?  Packs/day: 1.00  ?  Types: Cigarettes  ?  Quit date: 01/07/1992  ?  Years since quitting: 29.8  ? Smokeless tobacco: Never  ?Substance and Sexual Activity  ? Alcohol use: Not Currently  ?  Alcohol/week: 2.0 standard drinks  ?  Types: 2 Glasses of wine per week  ? Drug use: No  ? Sexual activity: Yes  ?Other Topics Concern  ? Not on file  ?Social History Narrative  ? Not on file  ? ?Social Determinants of Health  ? ?Financial Resource Strain: Not on file  ?Food Insecurity: Not on file  ?Transportation Needs: Not on file  ?Physical Activity: Not on file  ?Stress: Not on file  ?Social Connections: Not on file  ?Intimate Partner Violence: Not on file  ? ?Family History  ?Problem Relation Age of Onset  ? Cancer - Other Mother   ? CVA Father   ? Heart attack Brother   ? Heart attack Maternal Grandmother   ? ?ROS: All systems reviewed and negative except as per HPI. ? ?Current Outpatient Medications  ?Medication Sig Dispense Refill  ? acetaminophen (TYLENOL) 500 MG tablet Take 1,000 mg by mouth every 8 (eight) hours as needed for mild pain or headache.    ? Cholecalciferol (VITAMIN D3) 50 MCG (2000 UT) TABS Take 2,000 Units by mouth daily.    ? docusate sodium (COLACE) 100 MG capsule Take 100 mg by mouth at bedtime.    ? ELIQUIS 5 MG TABS tablet TAKE 1 TABLET BY MOUTH TWICE DAILY . APPOINTMENT REQUIRED FOR FUTURE REFILLS 60 tablet 11  ? furosemide (LASIX) 40 MG tablet Take 1 tablet (40 mg total) by mouth every morning AND 0.5 tablets (20 mg total) every evening. 135 tablet 3  ? Menthol, Topical Analgesic, (BIOFREEZE ROLL-ON COLORLESS) 4 % GEL Apply 1 application topically daily as needed (pain).    ? metoprolol succinate (TOPROL XL) 25 MG 24 hr tablet Take 1 tablet (25 mg total) by mouth daily. 90 tablet 1  ? midodrine (PROAMATINE) 5 MG tablet TAKE 1 TABLET BY  MOUTH THREE TIMES DAILY 90 tablet 0  ? Omega-3 Fatty Acids (FISH OIL) 1200 MG CAPS Take 1,200 mg by mouth daily.     ? potassium chloride SA (KLOR-CON M) 20 MEQ tablet Take 1 tablet (20 mEq total) by mouth daily. 90 tablet 3  ? rosuvastatin (CRESTOR) 10 MG tablet Take 1 tablet by mouth once daily 90 tablet 3  ? vitamin B-12 (CYANOCOBALAMIN) 1000 MCG tablet Take 1,000 mcg by mouth daily.    ? ?No current facility-administered medications for this encounter.  ? ?BP 122/78   Pulse 76   Wt 107.1 kg (236 lb 3.2 oz)  SpO2 97%   BMI 32.03 kg/m?  ?General: NAD ?Neck: No JVD, no thyromegaly or thyroid nodule.  ?Lungs: Clear to auscultation bilaterally with normal respiratory effort. ?CV: Nondisplaced PMI.  Heart irregular S1/S2, no S3/S4, no murmur.  No peripheral edema.  No carotid bruit.  Normal pedal pulses.  ?Abdomen: Soft, nontender, no hepatosplenomegaly, no distention.  ?Skin: Intact without lesions or rashes.  ?Neurologic: Alert and oriented x 3.  ?Psych: Normal affect. ?Extremities: No clubbing or cyanosis.  ?HEENT: Normal.  ? ?Assessment/Plan: ?1. Atrial fibrillation: DCCV in 12/18.  Recurrence of atrial fibrillation with initiation of amiodarone then TEE-guided DCCV in 1/19.  Amiodarone stopped fall of 2022, back in AF in 12/22.  Amiodarone restarted and he had DCCV to NSR in 1/23 but atrial fibrillation recurred < 2 days despite amiodarone.  DCCV again in 2/23 with recurrence of AF again.  He remains in atrial fibrillation today. Rate is controlled, he does not feel palpitations.  ?- He has failed amiodarone at this point and we will not re-attempt DCCV.  I will have him stop amiodarone and start Toprol XL 25 mg daily for rate control.     ?- Continue apixaban.  ?2. CKD: BMET today.   ?3. Chronic diastolic CHF: EF 97-98% from 1/19 TEE.  Prior workup negative for cardiac amyloidosis.  Echo in 9/22 with mild LVH, EF 55-60% (my review).  Volume status improved on higher Lasix dose, NYHA class II symptoms. Still  very active for his age.  ?- Continue Lasix 40 qam/20 qpm.  BMET today.  ?- Continue KDur 20 mEq daily.  ?4. Orthostatic hypotension: This has been present since his COVID-19 infection and may be related.  Terie Purser

## 2021-11-13 ENCOUNTER — Other Ambulatory Visit: Payer: Self-pay | Admitting: Cardiology

## 2021-11-17 DIAGNOSIS — E7800 Pure hypercholesterolemia, unspecified: Secondary | ICD-10-CM | POA: Diagnosis not present

## 2021-11-17 DIAGNOSIS — E1151 Type 2 diabetes mellitus with diabetic peripheral angiopathy without gangrene: Secondary | ICD-10-CM | POA: Diagnosis not present

## 2021-11-17 DIAGNOSIS — I5032 Chronic diastolic (congestive) heart failure: Secondary | ICD-10-CM | POA: Diagnosis not present

## 2021-11-17 DIAGNOSIS — I110 Hypertensive heart disease with heart failure: Secondary | ICD-10-CM | POA: Diagnosis not present

## 2021-11-20 ENCOUNTER — Other Ambulatory Visit (HOSPITAL_COMMUNITY): Payer: Self-pay | Admitting: Cardiology

## 2021-12-04 ENCOUNTER — Encounter: Payer: Self-pay | Admitting: Podiatry

## 2021-12-04 ENCOUNTER — Ambulatory Visit: Payer: Medicare HMO | Admitting: Podiatry

## 2021-12-04 DIAGNOSIS — D689 Coagulation defect, unspecified: Secondary | ICD-10-CM

## 2021-12-04 DIAGNOSIS — M79674 Pain in right toe(s): Secondary | ICD-10-CM | POA: Diagnosis not present

## 2021-12-04 DIAGNOSIS — M79675 Pain in left toe(s): Secondary | ICD-10-CM

## 2021-12-04 DIAGNOSIS — E1159 Type 2 diabetes mellitus with other circulatory complications: Secondary | ICD-10-CM | POA: Diagnosis not present

## 2021-12-04 DIAGNOSIS — I739 Peripheral vascular disease, unspecified: Secondary | ICD-10-CM | POA: Diagnosis not present

## 2021-12-04 DIAGNOSIS — B351 Tinea unguium: Secondary | ICD-10-CM | POA: Diagnosis not present

## 2021-12-04 NOTE — Progress Notes (Signed)
This patient returns to my office for at risk foot care.  This patient requires this care by a professional since this patient will be at risk due to having diabetic neuropathy,and coagulation defect.  Patient is taking eliquis.    Patient presents to the office with his daughter.  This patient is unable to cut nails himself since the patient cannot reach his nails.These nails are painful walking and wearing shoes. He presents to the office with multiple blackened lesions on digits both feet.  He says they developed six weeks ago wearing shoes.  He has not worn those shoes since that time.  No pain noted to theses lesions and no drainage.  This patient presents for at risk foot care today. ? ?General Appearance  Alert, conversant and in no acute stress. ? ?Vascular  Dorsalis pedis and posterior tibial  pulses are palpable  B/L  Capillary return is within normal limits  bilaterally. Temperature is within normal limits  Bilaterally.  Venous stasis  B/L. ? ?Neurologic  Senn-Weinstein monofilament wire test diminished  bilaterally. Muscle power within normal limits bilaterally. ? ?Nails Thick disfigured discolored nails with subungual debris  from hallux to fifth toes bilaterally. No evidence of bacterial infection or drainage bilaterally. ? ?Orthopedic  No limitations of motion  feet .  No crepitus or effusions noted.  Hallux malleus IPJ  B/L.  Hammer toes 2  B/L.  Midfoot DJD  B/L.  HAV  B/L.   ? ?Skin  normotropic skin with no porokeratosis noted bilaterally.  No signs of infections or ulcers noted.   Skin necrosis second toe left foot.  Healing skin lesions 1-3  B/L. ? ?Onychomycosis  Pain in right toes  Pain in left toes  Shin necrosis  B/L ? ?Consent was obtained for treatment procedures.   Mechanical debridement of nails 1-5  bilaterally performed with a nail nipper.  Filed with dremel without incident. Discussed these lesions with this patient.  Since they are healing we will watch his toes.  Patient is not  interested in vascular studies yet.  He was told if they worsen he needs a vascular study performed in the future. ? ? ?Return office visit    3 months                  Told patient to return for periodic foot care and evaluation due to potential at risk complications. ? ? ?Gardiner Barefoot DPM  ?

## 2021-12-13 ENCOUNTER — Other Ambulatory Visit (HOSPITAL_COMMUNITY): Payer: Self-pay | Admitting: Cardiology

## 2021-12-25 DIAGNOSIS — L720 Epidermal cyst: Secondary | ICD-10-CM | POA: Diagnosis not present

## 2021-12-25 DIAGNOSIS — Z85828 Personal history of other malignant neoplasm of skin: Secondary | ICD-10-CM | POA: Diagnosis not present

## 2021-12-25 DIAGNOSIS — L820 Inflamed seborrheic keratosis: Secondary | ICD-10-CM | POA: Diagnosis not present

## 2021-12-25 DIAGNOSIS — L570 Actinic keratosis: Secondary | ICD-10-CM | POA: Diagnosis not present

## 2021-12-25 DIAGNOSIS — D692 Other nonthrombocytopenic purpura: Secondary | ICD-10-CM | POA: Diagnosis not present

## 2021-12-25 DIAGNOSIS — D1801 Hemangioma of skin and subcutaneous tissue: Secondary | ICD-10-CM | POA: Diagnosis not present

## 2021-12-25 DIAGNOSIS — L821 Other seborrheic keratosis: Secondary | ICD-10-CM | POA: Diagnosis not present

## 2021-12-28 ENCOUNTER — Ambulatory Visit (INDEPENDENT_AMBULATORY_CARE_PROVIDER_SITE_OTHER): Payer: Medicare HMO

## 2021-12-28 DIAGNOSIS — I442 Atrioventricular block, complete: Secondary | ICD-10-CM | POA: Diagnosis not present

## 2021-12-28 LAB — CUP PACEART REMOTE DEVICE CHECK
Battery Remaining Longevity: 107 mo
Battery Voltage: 2.98 V
Brady Statistic RA Percent Paced: 0.25 %
Brady Statistic RV Percent Paced: 78.54 %
Date Time Interrogation Session: 20230510214301
Implantable Lead Implant Date: 20190216
Implantable Lead Implant Date: 20190216
Implantable Lead Location: 753859
Implantable Lead Location: 753860
Implantable Lead Model: 5076
Implantable Lead Model: 5076
Implantable Pulse Generator Implant Date: 20190216
Lead Channel Impedance Value: 285 Ohm
Lead Channel Impedance Value: 323 Ohm
Lead Channel Impedance Value: 380 Ohm
Lead Channel Impedance Value: 532 Ohm
Lead Channel Pacing Threshold Amplitude: 0.5 V
Lead Channel Pacing Threshold Amplitude: 1 V
Lead Channel Pacing Threshold Pulse Width: 0.4 ms
Lead Channel Pacing Threshold Pulse Width: 0.4 ms
Lead Channel Sensing Intrinsic Amplitude: 2.25 mV
Lead Channel Sensing Intrinsic Amplitude: 2.25 mV
Lead Channel Sensing Intrinsic Amplitude: 3.875 mV
Lead Channel Sensing Intrinsic Amplitude: 3.875 mV
Lead Channel Setting Pacing Amplitude: 1.5 V
Lead Channel Setting Pacing Amplitude: 2.5 V
Lead Channel Setting Pacing Pulse Width: 0.4 ms
Lead Channel Setting Sensing Sensitivity: 1.2 mV

## 2022-01-04 NOTE — Progress Notes (Signed)
Remote pacemaker transmission.   

## 2022-01-17 DIAGNOSIS — E1151 Type 2 diabetes mellitus with diabetic peripheral angiopathy without gangrene: Secondary | ICD-10-CM | POA: Diagnosis not present

## 2022-01-17 DIAGNOSIS — Z125 Encounter for screening for malignant neoplasm of prostate: Secondary | ICD-10-CM | POA: Diagnosis not present

## 2022-01-17 DIAGNOSIS — R7989 Other specified abnormal findings of blood chemistry: Secondary | ICD-10-CM | POA: Diagnosis not present

## 2022-01-17 DIAGNOSIS — E7800 Pure hypercholesterolemia, unspecified: Secondary | ICD-10-CM | POA: Diagnosis not present

## 2022-01-24 DIAGNOSIS — M160 Bilateral primary osteoarthritis of hip: Secondary | ICD-10-CM | POA: Diagnosis not present

## 2022-01-24 DIAGNOSIS — Z7901 Long term (current) use of anticoagulants: Secondary | ICD-10-CM | POA: Diagnosis not present

## 2022-01-24 DIAGNOSIS — I110 Hypertensive heart disease with heart failure: Secondary | ICD-10-CM | POA: Diagnosis not present

## 2022-01-24 DIAGNOSIS — Z950 Presence of cardiac pacemaker: Secondary | ICD-10-CM | POA: Diagnosis not present

## 2022-01-24 DIAGNOSIS — I480 Paroxysmal atrial fibrillation: Secondary | ICD-10-CM | POA: Diagnosis not present

## 2022-01-24 DIAGNOSIS — Z1331 Encounter for screening for depression: Secondary | ICD-10-CM | POA: Diagnosis not present

## 2022-01-24 DIAGNOSIS — G6289 Other specified polyneuropathies: Secondary | ICD-10-CM | POA: Diagnosis not present

## 2022-01-24 DIAGNOSIS — R82998 Other abnormal findings in urine: Secondary | ICD-10-CM | POA: Diagnosis not present

## 2022-01-24 DIAGNOSIS — D6869 Other thrombophilia: Secondary | ICD-10-CM | POA: Diagnosis not present

## 2022-01-24 DIAGNOSIS — E1151 Type 2 diabetes mellitus with diabetic peripheral angiopathy without gangrene: Secondary | ICD-10-CM | POA: Diagnosis not present

## 2022-01-24 DIAGNOSIS — Z1339 Encounter for screening examination for other mental health and behavioral disorders: Secondary | ICD-10-CM | POA: Diagnosis not present

## 2022-01-24 DIAGNOSIS — Z0000 Encounter for general adult medical examination without abnormal findings: Secondary | ICD-10-CM | POA: Diagnosis not present

## 2022-01-24 DIAGNOSIS — I5032 Chronic diastolic (congestive) heart failure: Secondary | ICD-10-CM | POA: Diagnosis not present

## 2022-01-24 DIAGNOSIS — E7800 Pure hypercholesterolemia, unspecified: Secondary | ICD-10-CM | POA: Diagnosis not present

## 2022-02-12 ENCOUNTER — Other Ambulatory Visit (HOSPITAL_COMMUNITY): Payer: Self-pay | Admitting: Cardiology

## 2022-02-13 ENCOUNTER — Other Ambulatory Visit (HOSPITAL_COMMUNITY): Payer: Self-pay | Admitting: Cardiology

## 2022-03-07 ENCOUNTER — Ambulatory Visit (INDEPENDENT_AMBULATORY_CARE_PROVIDER_SITE_OTHER): Payer: Medicare HMO | Admitting: Podiatry

## 2022-03-07 ENCOUNTER — Encounter: Payer: Self-pay | Admitting: Podiatry

## 2022-03-07 ENCOUNTER — Ambulatory Visit: Payer: Medicare HMO | Admitting: Podiatry

## 2022-03-07 DIAGNOSIS — E1159 Type 2 diabetes mellitus with other circulatory complications: Secondary | ICD-10-CM | POA: Diagnosis not present

## 2022-03-07 DIAGNOSIS — B351 Tinea unguium: Secondary | ICD-10-CM

## 2022-03-07 DIAGNOSIS — I739 Peripheral vascular disease, unspecified: Secondary | ICD-10-CM

## 2022-03-07 DIAGNOSIS — M79675 Pain in left toe(s): Secondary | ICD-10-CM | POA: Diagnosis not present

## 2022-03-07 DIAGNOSIS — M79674 Pain in right toe(s): Secondary | ICD-10-CM | POA: Diagnosis not present

## 2022-03-07 DIAGNOSIS — D689 Coagulation defect, unspecified: Secondary | ICD-10-CM

## 2022-03-07 NOTE — Progress Notes (Signed)
This patient returns to my office for at risk foot care.  This patient requires this care by a professional since this patient will be at risk due to having diabetic neuropathy,and coagulation defect.  Patient is taking eliquis.    Patient presents to the office with his wife.  This patient is unable to cut nails himself since the patient cannot reach his nails.These nails are painful walking and wearing shoes.  This patient presents for at risk foot care today.  General Appearance  Alert, conversant and in no acute stress.  Vascular  Dorsalis pedis and posterior tibial  pulses are palpable  Left foot . Absent dorsalis pedis and posterior tibial pulses right foot.  Capillary return is within normal limits  bilaterally. Temperature is within normal limits  Bilaterally.  Venous stasis  B/L.  Neurologic  Senn-Weinstein monofilament wire test diminished  bilaterally. Muscle power within normal limits bilaterally.  Nails Thick disfigured discolored nails with subungual debris  from hallux to fifth toes bilaterally. No evidence of bacterial infection or drainage bilaterally.  Orthopedic  No limitations of motion  feet .  No crepitus or effusions noted.  Hallux malleus IPJ  B/L.  Hammer toes 2  B/L.  Midfoot DJD  B/L.  HAV  B/L.    Skin  normotropic skin with no porokeratosis noted bilaterally.  No signs of infections or ulcers noted.     Onychomycosis  Pain in right toes  Pain in left toes  Consent was obtained for treatment procedures.   Mechanical debridement of nails 1-5  bilaterally performed with a nail nipper.  Filed with dremel without incident. Visual inspection was performed.   Return office visit    3 months                  Told patient to return for periodic foot care and evaluation due to potential at risk complications.   Gardiner Barefoot DPM

## 2022-03-12 ENCOUNTER — Ambulatory Visit (HOSPITAL_COMMUNITY)
Admission: RE | Admit: 2022-03-12 | Discharge: 2022-03-12 | Disposition: A | Discharge status: Home / Self Care | Payer: Medicare HMO | Source: Ambulatory Visit | Attending: Cardiology | Admitting: Cardiology

## 2022-03-12 ENCOUNTER — Encounter (HOSPITAL_COMMUNITY): Payer: Self-pay | Admitting: Cardiology

## 2022-03-12 ENCOUNTER — Other Ambulatory Visit (HOSPITAL_COMMUNITY): Payer: Self-pay | Admitting: Cardiology

## 2022-03-12 VITALS — BP 124/84 | HR 72 | Wt 241.8 lb

## 2022-03-12 DIAGNOSIS — I130 Hypertensive heart and chronic kidney disease with heart failure and stage 1 through stage 4 chronic kidney disease, or unspecified chronic kidney disease: Secondary | ICD-10-CM | POA: Insufficient documentation

## 2022-03-12 DIAGNOSIS — Z7901 Long term (current) use of anticoagulants: Secondary | ICD-10-CM | POA: Diagnosis not present

## 2022-03-12 DIAGNOSIS — I951 Orthostatic hypotension: Secondary | ICD-10-CM | POA: Diagnosis not present

## 2022-03-12 DIAGNOSIS — E1122 Type 2 diabetes mellitus with diabetic chronic kidney disease: Secondary | ICD-10-CM | POA: Insufficient documentation

## 2022-03-12 DIAGNOSIS — Z8616 Personal history of COVID-19: Secondary | ICD-10-CM | POA: Insufficient documentation

## 2022-03-12 DIAGNOSIS — Z79899 Other long term (current) drug therapy: Secondary | ICD-10-CM | POA: Diagnosis not present

## 2022-03-12 DIAGNOSIS — I4819 Other persistent atrial fibrillation: Secondary | ICD-10-CM | POA: Insufficient documentation

## 2022-03-12 DIAGNOSIS — I5032 Chronic diastolic (congestive) heart failure: Secondary | ICD-10-CM

## 2022-03-12 LAB — BASIC METABOLIC PANEL
Anion gap: 8 (ref 5–15)
BUN: 16 mg/dL (ref 8–23)
CO2: 25 mmol/L (ref 22–32)
Calcium: 9 mg/dL (ref 8.9–10.3)
Chloride: 105 mmol/L (ref 98–111)
Creatinine, Ser: 1.09 mg/dL (ref 0.61–1.24)
GFR, Estimated: >60 mL/min (ref >60)
Glucose, Bld: 90 mg/dL (ref 70–99)
Potassium: 3.8 mmol/L (ref 3.5–5.1)
Sodium: 138 mmol/L (ref 135–145)

## 2022-03-12 LAB — CBC
HCT: 42.7 % (ref 39.0–52.0)
Hemoglobin: 13.8 g/dL (ref 13.0–17.0)
MCH: 31.4 pg (ref 26.0–34.0)
MCHC: 32.3 g/dL (ref 30.0–36.0)
MCV: 97 fL (ref 80.0–100.0)
Platelets: 151 10*3/uL (ref 150–400)
RBC: 4.4 MIL/uL (ref 4.22–5.81)
RDW: 12.9 % (ref 11.5–15.5)
WBC: 7.3 10*3/uL (ref 4.0–10.5)
nRBC: 0 % (ref 0.0–0.2)

## 2022-03-12 NOTE — Patient Instructions (Signed)
There has been no changes to your medications.  Labs done today, your results will be available in MyChart, we will contact you for abnormal readings.  Your physician recommends that you schedule a follow-up appointment in: 4 months (November 2023) ** please call the office in August to arrange your follow up **  If you have any questions or concerns before your next appointment please send [place] a message through [place] or call our office at [phone].    TO LEAVE A MESSAGE FOR THE NURSE SELECT OPTION 2, PLEASE LEAVE A MESSAGE INCLUDING: YOUR NAME DATE OF BIRTH CALL BACK NUMBER REASON FOR CALL**this is important as we prioritize the call backs  YOU WILL RECEIVE A CALL BACK THE SAME DAY AS LONG AS YOU CALL BEFORE 4:00 PM  At the [facility], you and your health needs are our priority. As part of our continuing mission to provide you with exceptional heart care, we have created designated Provider Care Teams. These Care Teams include your primary Cardiologist (physician) and Advanced Practice Providers (APPs- Physician Assistants and Nurse Practitioners) who all work together to provide you with the care you need, when you need it.   You may see any of the following providers on your designated Care Team at your next follow up: Dr Glori Bickers Dr Haynes Kerns, NP Lyda Jester, [place] [facility] [place], [place] Audry Riles, PharmD   Please be sure to bring in all your medications bottles to every appointment.

## 2022-03-12 NOTE — Progress Notes (Signed)
Medication Samples have been provided to the patient.  Drug name: Eliquis       Strength: '5mg'$         Qty: 4 boxes   LOT: [id number]  Exp.Date: 4/25  Dosing instructions: take 1 tablet twice a day   The patient has been instructed regarding the correct time, dose, and frequency of taking this medication, including desired effects and most common side effects.   Dylan [name] M Patirica Longshore 10:12 AM 03/12/2022

## 2022-03-12 NOTE — Progress Notes (Signed)
ID:  Dylan [name], DOB 19-Jan-1926, MRN [id number]   Provider location: [place] [facility] Type of Visit: Established patient  PCP:  Haywood Pao, MD  Cardiologist:  Loralie Champagne, MD   History of Present Illness: Dylan [name] is a 88 y.o. with history of DM and HTN as well as atrial fibrillation and CHF.    Patient was doing well until 11/18 when he developed exertional dyspnea.  He went to the ER 07/14/17 and was found to be in atrial fibrillation with volume overload.  He was admitted overnight, diuresed and started on Eliquis, and discharged.  He remained in atrial fibrillation and short of breath.   He was re-hospitalized with dyspnea in 12/18.  TEE-guided DCCV to NSR was done and he was diuresed. TEE showed EF 50%.    He was noted to be back in atrial fibrillation and was started on amiodarone.  TEE-guided DCCV in 1/19 was done, with conversion back to NSR.   He was admitted in 2/19 with syncope and found to have tachy-brady syndrome with junctional bradycardia (symptomatic). Medtronic dual chamber PPM was placed.   He was readmitted in 2/19 with pneumonia.  During this admission, he was noted to be back in atrial fibrillation but converted back to NSR.   In 12/20, he developed COVID-19 pneumonitis and was hospitalized at [facility].  He developed orthostatic hypotension after COVID-19.  He was started on midodrine.   PYP scan in 9/21 was not suggestive of TTR cardiac amyloidosis.   Echo in 9/22 showed EF 55-60%, mild LVH (reviewed echo).   Patient was noted to be in atrial fibrillation in 1/23 after stopping amiodarone.  Amiodarone was restarted and he was cardioverted to NSR in 1/23.  However, he only stayed in NSR for about 2 days based on device interrogation and has been back in persistent atrial fibrillation. He had DCCV again to NSR in 2/23 but was back in AF a couple of days later.   He returns for followup of atrial fibrillation and  CHF.  He is now chronically in AF. Weight is up about 5 lbs.  Minimal peripheral edema.  He walks around the house without dyspnea.  He uses a rolling walker.  Mild dyspnea walking into the office today.  He still rides his exercise bike 2-3 miles/day and goes to the gym to lift light weights.    Labs (12/20): hgb 12.9, TSH normal, BNP 72, LFTs normal, K 3.9, creatinine 0.88 Labs (2/21): TSH normal, hgb 13, LDL 52, K 3.8, creatinine 1.03, LFTs normal Labs (6/21): hgb 13.2, TSH normal, K 4, creatinine 1.19, LFTs normal Labs (9/21): Myeloma panel and urine immunofixation negative, LFTs normal, K 4.1, creatinine 1.37 Labs (8/22): hgb 13.2, TSH normal, K 4.5, creatinine 1.22, LFTs normal.  Labs (10/22): K 3.9, creatinine 1.25 Labs (1/23): K 3.6, creatinine 1.06, hgb 12.6 Labs (2/23): K 3.8, creatinine 1.25, hgb 13.1 Labs (3/23): K 4.2, creatinine 1.33  PMH: 1. Chronic diastolic CHF: Echo (40/98) with EF 50-55%, mild-moderate LVH, mild AI, mild MR, normal RV size and systolic function, PASP 45 mmHg.  - TEE (12/18): EF 50%, normal RV size and systolic function, mild MR.  - TEE (1/19): EF 55-60%, mild LVH, mild MR, normal RV, aortic sclerosis without significant stenosis.  - PYP scan (9/21): grade 1, H/CL 1.03 => unlikely TTR cardiac amyloidosis.  - Echo (9/22): EF 55-60%, mild LVH (mild review) 2. Atrial fibrillation: Persistent, initially diagnosed in 11/18.  -  TEE-guided DCCV in 12/18.  - TEE-guided DCCV in 1/19 on amiodarone.  - DCCV 1/23 on amiodarone - DCCV 2/23  3. Chest pain: LHC remotely without obstructive disease.  1/16 Cardiolite with no ischemia.  4. Type II diabetes 5. HTN 6. CKD: Stage 3.  7. Tachy-brady syndrome: Medtronic dual chamber.   8. Orthostatic hypotension.  9. COVID-19 pneumonitis 10. ABIs (6/19): Normal 11. Spinal stenosis  Social History   Socioeconomic History   Marital status: Married    Spouse name: Not on file   Number of children: Not on file   Years  of education: Not on file   Highest education level: Not on file  Occupational History   Not on file  Tobacco Use   Smoking status: Former    Packs/day: 1.00    Types: Cigarettes    Quit date: 01/07/1992    Years since quitting: 30.1   Smokeless tobacco: Never  Substance and Sexual Activity   Alcohol use: Not Currently    Alcohol/week: 2.0 standard drinks of alcohol    Types: 2 Glasses of wine per week   Drug use: No   Sexual activity: Yes  Other Topics Concern   Not on file  Social History Narrative   Not on file   Social Determinants of Health   Financial Resource Strain: Not on file  Food Insecurity: Not on file  Transportation Needs: Not on file  Physical Activity: Not on file  Stress: Not on file  Social Connections: Not on file  Intimate Partner Violence: Not on file   Family History  Problem Relation Age of Onset   Cancer - Other Mother    CVA Father    Heart attack Brother    Heart attack Maternal Grandmother    ROS: All systems reviewed and negative except as per HPI.  Current Outpatient Medications  Medication Sig Dispense Refill   acetaminophen (TYLENOL) 500 MG tablet Take 1,000 mg by mouth every 8 (eight) hours as needed for mild pain or headache.     Cholecalciferol (VITAMIN D3) 50 MCG (2000 UT) TABS Take 2,000 Units by mouth daily.     Docusate Calcium (CVS STOOL SOFTENER PO) Take by mouth. 1 tablet every other day.     ELIQUIS 5 MG TABS tablet TAKE 1 TABLET BY MOUTH TWICE DAILY . APPOINTMENT REQUIRED FOR FUTURE REFILLS 60 tablet 11   furosemide (LASIX) 40 MG tablet Take 1 tablet (40 mg total) by mouth every morning AND 0.5 tablets (20 mg total) every evening. 135 tablet 3   Menthol, Topical Analgesic, (BIOFREEZE ROLL-ON COLORLESS) 4 % GEL Apply 1 application topically daily as needed (pain).     metoprolol succinate (TOPROL-XL) 25 MG 24 hr tablet Take 1 tablet by mouth once daily 90 tablet 3   midodrine (PROAMATINE) 5 MG tablet TAKE 1 TABLET BY MOUTH  THREE TIMES DAILY 90 tablet 3   Omega-3 Fatty Acids (FISH OIL) 1200 MG CAPS Take 1,200 mg by mouth daily.      potassium chloride SA (KLOR-CON M) 20 MEQ tablet Take 1 tablet (20 mEq total) by mouth daily. 90 tablet 3   rosuvastatin (CRESTOR) 10 MG tablet Take 1 tablet by mouth once daily 90 tablet 3   vitamin B-12 (CYANOCOBALAMIN) 1000 MCG tablet Take 1,000 mcg by mouth daily.     No current facility-administered medications for this encounter.   BP 124/84   Pulse 72   Wt 109.7 kg (241 lb 12.8 oz)   SpO2 97%  BMI 32.79 kg/m  General: NAD Neck: No JVD, no thyromegaly or thyroid nodule.  Lungs: Clear to auscultation bilaterally with normal respiratory effort. CV: Nondisplaced PMI.  Heart irregular S1/S2, no S3/S4, no murmur.  Trace ankle edema.  No carotid bruit.  Normal pedal pulses.  Abdomen: Soft, nontender, no hepatosplenomegaly, no distention.  Skin: Intact without lesions or rashes.  Neurologic: Alert and oriented x 3.  Psych: Normal affect. Extremities: No clubbing or cyanosis.  HEENT: Normal.   Assessment/Plan: 1. Atrial fibrillation: DCCV in 12/18.  Recurrence of atrial fibrillation with initiation of amiodarone then TEE-guided DCCV in 1/19.  Amiodarone stopped fall of 2022, back in AF in 12/22.  Amiodarone restarted and he had DCCV to NSR in 1/23 but atrial fibrillation recurred < 2 days despite amiodarone.  DCCV again in 2/23 with recurrence of AF again.  He remains in atrial fibrillation today. Rate is controlled, he does not feel palpitations.  - He has failed amiodarone at this point and we will not re-attempt DCCV. He will continue Toprol XL 25 mg daily for rate control.     - Continue apixaban. CBC today.  2. CKD: BMET today.   3. Chronic diastolic CHF: EF 86-82% from 1/19 TEE.  Prior workup negative for cardiac amyloidosis.  Echo in 9/22 with mild LVH, EF 55-60% (my review).  Volume status looks ok today, NYHA class II symptoms. Still very active for his age.  -  Continue Lasix 40 qam/20 qpm.  BMET today.  - Continue KDur 20 mEq daily.  4. Orthostatic hypotension: This has been present since his COVID-19 infection and may be related.  This seems to be improved.  - He remains on low dose midodrine.   Followup in 4 months    Signed, Loralie Champagne, MD  03/12/2022  [place] [address] [facility] [place] [address] [phone] (office) [phone] (fax)

## 2022-03-29 ENCOUNTER — Ambulatory Visit (INDEPENDENT_AMBULATORY_CARE_PROVIDER_SITE_OTHER): Payer: Medicare HMO

## 2022-03-29 DIAGNOSIS — I495 Sick sinus syndrome: Secondary | ICD-10-CM

## 2022-03-29 LAB — CUP PACEART REMOTE DEVICE CHECK
Battery Remaining Longevity: 100 mo
Battery Voltage: 2.98 V
Brady Statistic RA Percent Paced: 0.51 %
Brady Statistic RV Percent Paced: 59.06 %
Date Time Interrogation Session: 20230809185137
Implantable Lead Implant Date: 20190216
Implantable Lead Implant Date: 20190216
Implantable Lead Location: 753859
Implantable Lead Location: 753860
Implantable Lead Model: 5076
Implantable Lead Model: 5076
Implantable Pulse Generator Implant Date: 20190216
Lead Channel Impedance Value: 304 Ohm
Lead Channel Impedance Value: 323 Ohm
Lead Channel Impedance Value: 380 Ohm
Lead Channel Impedance Value: 532 Ohm
Lead Channel Pacing Threshold Amplitude: 0.5 V
Lead Channel Pacing Threshold Amplitude: 0.875 V
Lead Channel Pacing Threshold Pulse Width: 0.4 ms
Lead Channel Pacing Threshold Pulse Width: 0.4 ms
Lead Channel Sensing Intrinsic Amplitude: 1.5 mV
Lead Channel Sensing Intrinsic Amplitude: 1.5 mV
Lead Channel Sensing Intrinsic Amplitude: 3.125 mV
Lead Channel Sensing Intrinsic Amplitude: 3.125 mV
Lead Channel Setting Pacing Amplitude: 1.5 V
Lead Channel Setting Pacing Amplitude: 2.5 V
Lead Channel Setting Pacing Pulse Width: 0.4 ms
Lead Channel Setting Sensing Sensitivity: 1.2 mV

## 2022-04-06 ENCOUNTER — Other Ambulatory Visit: Payer: Self-pay | Admitting: Cardiology

## 2022-04-25 NOTE — Progress Notes (Signed)
Remote pacemaker transmission.   

## 2022-05-26 DIAGNOSIS — Z23 Encounter for immunization: Secondary | ICD-10-CM | POA: Diagnosis not present

## 2022-06-08 ENCOUNTER — Other Ambulatory Visit (HOSPITAL_COMMUNITY): Payer: Self-pay | Admitting: Cardiology

## 2022-06-10 ENCOUNTER — Encounter (HOSPITAL_BASED_OUTPATIENT_CLINIC_OR_DEPARTMENT_OTHER): Payer: Self-pay | Admitting: Emergency Medicine

## 2022-06-10 ENCOUNTER — Other Ambulatory Visit: Payer: Self-pay

## 2022-06-10 ENCOUNTER — Emergency Department (HOSPITAL_BASED_OUTPATIENT_CLINIC_OR_DEPARTMENT_OTHER): Payer: Medicare HMO

## 2022-06-10 ENCOUNTER — Observation Stay (HOSPITAL_BASED_OUTPATIENT_CLINIC_OR_DEPARTMENT_OTHER)
Admission: EM | Admit: 2022-06-10 | Discharge: 2022-06-12 | Disposition: A | Discharge status: Home Health | Payer: Medicare HMO | Attending: Family Medicine | Admitting: Family Medicine

## 2022-06-10 ENCOUNTER — Encounter (HOSPITAL_COMMUNITY): Payer: Self-pay

## 2022-06-10 ENCOUNTER — Emergency Department (HOSPITAL_BASED_OUTPATIENT_CLINIC_OR_DEPARTMENT_OTHER): Payer: Medicare HMO | Admitting: Radiology

## 2022-06-10 DIAGNOSIS — I5032 Chronic diastolic (congestive) heart failure: Secondary | ICD-10-CM | POA: Insufficient documentation

## 2022-06-10 DIAGNOSIS — Z7901 Long term (current) use of anticoagulants: Secondary | ICD-10-CM | POA: Insufficient documentation

## 2022-06-10 DIAGNOSIS — I2510 Atherosclerotic heart disease of native coronary artery without angina pectoris: Secondary | ICD-10-CM | POA: Diagnosis not present

## 2022-06-10 DIAGNOSIS — R2689 Other abnormalities of gait and mobility: Secondary | ICD-10-CM | POA: Insufficient documentation

## 2022-06-10 DIAGNOSIS — I495 Sick sinus syndrome: Secondary | ICD-10-CM | POA: Insufficient documentation

## 2022-06-10 DIAGNOSIS — S2242XA Multiple fractures of ribs, left side, initial encounter for closed fracture: Secondary | ICD-10-CM | POA: Diagnosis not present

## 2022-06-10 DIAGNOSIS — W010XXA Fall on same level from slipping, tripping and stumbling without subsequent striking against object, initial encounter: Secondary | ICD-10-CM | POA: Insufficient documentation

## 2022-06-10 DIAGNOSIS — L8990 Pressure ulcer of unspecified site, unspecified stage: Secondary | ICD-10-CM | POA: Insufficient documentation

## 2022-06-10 DIAGNOSIS — E119 Type 2 diabetes mellitus without complications: Secondary | ICD-10-CM | POA: Diagnosis not present

## 2022-06-10 DIAGNOSIS — R739 Hyperglycemia, unspecified: Secondary | ICD-10-CM

## 2022-06-10 DIAGNOSIS — Z950 Presence of cardiac pacemaker: Secondary | ICD-10-CM | POA: Insufficient documentation

## 2022-06-10 DIAGNOSIS — Z87891 Personal history of nicotine dependence: Secondary | ICD-10-CM | POA: Diagnosis not present

## 2022-06-10 DIAGNOSIS — Z79899 Other long term (current) drug therapy: Secondary | ICD-10-CM | POA: Diagnosis not present

## 2022-06-10 DIAGNOSIS — W19XXXA Unspecified fall, initial encounter: Secondary | ICD-10-CM

## 2022-06-10 DIAGNOSIS — Z8616 Personal history of COVID-19: Secondary | ICD-10-CM | POA: Diagnosis not present

## 2022-06-10 DIAGNOSIS — Y9289 Other specified places as the place of occurrence of the external cause: Secondary | ICD-10-CM | POA: Insufficient documentation

## 2022-06-10 DIAGNOSIS — Y92009 Unspecified place in unspecified non-institutional (private) residence as the place of occurrence of the external cause: Secondary | ICD-10-CM | POA: Diagnosis not present

## 2022-06-10 DIAGNOSIS — Z96659 Presence of unspecified artificial knee joint: Secondary | ICD-10-CM | POA: Diagnosis not present

## 2022-06-10 DIAGNOSIS — I110 Hypertensive heart disease with heart failure: Secondary | ICD-10-CM | POA: Diagnosis not present

## 2022-06-10 DIAGNOSIS — R2681 Unsteadiness on feet: Secondary | ICD-10-CM | POA: Diagnosis not present

## 2022-06-10 DIAGNOSIS — K5790 Diverticulosis of intestine, part unspecified, without perforation or abscess without bleeding: Secondary | ICD-10-CM | POA: Insufficient documentation

## 2022-06-10 DIAGNOSIS — M6281 Muscle weakness (generalized): Secondary | ICD-10-CM | POA: Insufficient documentation

## 2022-06-10 DIAGNOSIS — I480 Paroxysmal atrial fibrillation: Secondary | ICD-10-CM | POA: Diagnosis not present

## 2022-06-10 DIAGNOSIS — S2239XA Fracture of one rib, unspecified side, initial encounter for closed fracture: Secondary | ICD-10-CM | POA: Diagnosis present

## 2022-06-10 DIAGNOSIS — J9601 Acute respiratory failure with hypoxia: Secondary | ICD-10-CM | POA: Diagnosis not present

## 2022-06-10 DIAGNOSIS — R918 Other nonspecific abnormal finding of lung field: Secondary | ICD-10-CM | POA: Diagnosis not present

## 2022-06-10 DIAGNOSIS — S299XXA Unspecified injury of thorax, initial encounter: Secondary | ICD-10-CM | POA: Diagnosis not present

## 2022-06-10 DIAGNOSIS — K5730 Diverticulosis of large intestine without perforation or abscess without bleeding: Secondary | ICD-10-CM | POA: Diagnosis not present

## 2022-06-10 LAB — BASIC METABOLIC PANEL
Anion gap: 11 (ref 5–15)
BUN: 19 mg/dL (ref 8–23)
CO2: 29 mmol/L (ref 22–32)
Calcium: 9.9 mg/dL (ref 8.9–10.3)
Chloride: 100 mmol/L (ref 98–111)
Creatinine, Ser: 1.06 mg/dL (ref 0.61–1.24)
GFR, Estimated: >60 mL/min (ref >60)
Glucose, Bld: 112 mg/dL — ABNORMAL HIGH (ref 70–99)
Potassium: 4 mmol/L (ref 3.5–5.1)
Sodium: 140 mmol/L (ref 135–145)

## 2022-06-10 LAB — CBC
HCT: 44.3 % (ref 39.0–52.0)
Hemoglobin: 14.8 g/dL (ref 13.0–17.0)
MCH: 31.5 pg (ref 26.0–34.0)
MCHC: 33.4 g/dL (ref 30.0–36.0)
MCV: 94.3 fL (ref 80.0–100.0)
Platelets: 129 10*3/uL — ABNORMAL LOW (ref 150–400)
RBC: 4.7 MIL/uL (ref 4.22–5.81)
RDW: 13.1 % (ref 11.5–15.5)
WBC: 7.3 10*3/uL (ref 4.0–10.5)
nRBC: 0 % (ref 0.0–0.2)

## 2022-06-10 MED ORDER — DOCUSATE SODIUM 100 MG PO CAPS: 100.0000 mg | ORAL_CAPSULE | Freq: Two times a day (BID) | ORAL | Status: DC | PRN

## 2022-06-10 MED ORDER — LIDOCAINE 5 % EX PTCH
1.0000 | MEDICATED_PATCH | CUTANEOUS | Status: DC
  Administered 2022-06-10 – 2022-06-11 (×2): 1 via TRANSDERMAL
  Filled 2022-06-10 (×2): qty 1

## 2022-06-10 MED ORDER — FUROSEMIDE 20 MG PO TABS
20.0000 mg | ORAL_TABLET | Freq: Every evening | ORAL | Status: DC
  Administered 2022-06-10 – 2022-06-11 (×2): 20 mg via ORAL
  Filled 2022-06-10 (×2): qty 1

## 2022-06-10 MED ORDER — ROSUVASTATIN CALCIUM 5 MG PO TABS
10.0000 mg | ORAL_TABLET | Freq: Every day | ORAL | Status: DC
  Administered 2022-06-11 – 2022-06-12 (×2): 10 mg via ORAL
  Filled 2022-06-10 (×2): qty 2

## 2022-06-10 MED ORDER — ONDANSETRON HCL 4 MG/2ML IJ SOLN: 4.0000 mg | Freq: Four times a day (QID) | INTRAMUSCULAR | Status: DC | PRN

## 2022-06-10 MED ORDER — VITAMIN D 25 MCG (1000 UNIT) PO TABS
2000.0000 [IU] | ORAL_TABLET | Freq: Every day | ORAL | Status: DC
  Administered 2022-06-10 – 2022-06-12 (×3): 2000 [IU] via ORAL
  Filled 2022-06-10 (×5): qty 2

## 2022-06-10 MED ORDER — IOHEXOL 300 MG/ML  SOLN
100.0000 mL | Freq: Once | INTRAMUSCULAR | Status: AC | PRN
  Administered 2022-06-10: 80 mL via INTRAVENOUS

## 2022-06-10 MED ORDER — METOPROLOL SUCCINATE ER 25 MG PO TB24
25.0000 mg | ORAL_TABLET | Freq: Every day | ORAL | Status: DC
  Administered 2022-06-10 – 2022-06-12 (×3): 25 mg via ORAL
  Filled 2022-06-10 (×3): qty 1

## 2022-06-10 MED ORDER — FENTANYL CITRATE PF 50 MCG/ML IJ SOSY
50.0000 ug | PREFILLED_SYRINGE | Freq: Once | INTRAMUSCULAR | Status: AC
  Administered 2022-06-10: 50 ug via INTRAVENOUS
  Filled 2022-06-10: qty 1

## 2022-06-10 MED ORDER — ACETAMINOPHEN 500 MG PO TABS
1000.0000 mg | ORAL_TABLET | Freq: Three times a day (TID) | ORAL | Status: DC | PRN
  Administered 2022-06-11: 1000 mg via ORAL
  Filled 2022-06-10: qty 2

## 2022-06-10 MED ORDER — ONDANSETRON HCL 4 MG PO TABS: 4.0000 mg | ORAL_TABLET | Freq: Four times a day (QID) | ORAL | Status: DC | PRN

## 2022-06-10 MED ORDER — MIDODRINE HCL 5 MG PO TABS
5.0000 mg | ORAL_TABLET | Freq: Three times a day (TID) | ORAL | Status: DC
  Administered 2022-06-11 – 2022-06-12 (×4): 5 mg via ORAL
  Filled 2022-06-10 (×4): qty 1

## 2022-06-10 MED ORDER — BISACODYL 10 MG RE SUPP: 10.0000 mg | Freq: Every day | RECTAL | Status: DC | PRN

## 2022-06-10 MED ORDER — APIXABAN 5 MG PO TABS
5.0000 mg | ORAL_TABLET | Freq: Two times a day (BID) | ORAL | Status: DC
Start: 2022-06-10 — End: 2022-06-12
  Administered 2022-06-10 – 2022-06-12 (×5): 5 mg via ORAL
  Filled 2022-06-10: qty 1
  Filled 2022-06-10: qty 2
  Filled 2022-06-10 (×3): qty 1

## 2022-06-10 MED ORDER — FUROSEMIDE 40 MG PO TABS
40.0000 mg | ORAL_TABLET | Freq: Every morning | ORAL | Status: DC
  Administered 2022-06-11 – 2022-06-12 (×2): 40 mg via ORAL
  Filled 2022-06-10 (×2): qty 1

## 2022-06-10 MED ORDER — VITAMIN B-12 1000 MCG PO TABS
1000.0000 ug | ORAL_TABLET | Freq: Every day | ORAL | Status: DC
  Administered 2022-06-11 – 2022-06-12 (×2): 1000 ug via ORAL
  Filled 2022-06-10 (×2): qty 1

## 2022-06-10 MED ORDER — KETOROLAC TROMETHAMINE 15 MG/ML IJ SOLN
15.0000 mg | Freq: Once | INTRAMUSCULAR | Status: AC
  Administered 2022-06-10: 15 mg via INTRAVENOUS
  Filled 2022-06-10: qty 1

## 2022-06-10 MED ORDER — POTASSIUM CHLORIDE CRYS ER 20 MEQ PO TBCR
20.0000 meq | EXTENDED_RELEASE_TABLET | Freq: Every day | ORAL | Status: DC
  Administered 2022-06-10 – 2022-06-12 (×3): 20 meq via ORAL
  Filled 2022-06-10 (×3): qty 1

## 2022-06-10 MED ORDER — HYDROMORPHONE HCL 1 MG/ML IJ SOLN: 0.5000 mg | INTRAMUSCULAR | Status: DC | PRN

## 2022-06-10 NOTE — ED Notes (Signed)
Mr. Condon's O2 sat on RA while walking dropped to 82%.  He's O2 sat recovered to 95% on RA with in 1 minute of resting.

## 2022-06-10 NOTE — H&P (Signed)
History and Physical    Dylan [name] HUD:[id number] DOB: 24-Jul-1926 DOA: 06/10/2022  PCP: Haywood Pao, MD (Confirm with patient/family/NH records and if not entered, this has to be entered at [facility] point of entry) Patient coming from: Home  I have personally briefly reviewed patient's old medical records in [place]  Chief Complaint: Left rib cage pain  HPI: Dylan [name] is a 88 y.o. male with medical history significant of PAF status post cardioversion, on Eliquis, HTN, chronic HFpEF, chronic orthostatic hypotension on midodrine, PPM, HCOM, MI CAD, presented to [facility] ED after a fall.  2 nights ago around 10 PM, patient fell at home, and he was trying to turn around at the bedside, he lost balance and left-sided chest hit the corner of the bed.  Denies any head or neck injury, no LOC.  Denies any prodromes of lightheadedness blurry vision shortness of breath or palpitations.  Overnight, patient developed severe pain associated with left-sided rib cage, has been sharp, constant 10/10, worsening with deep breath and cough or upper torso movement.  ED Course: Patient was found hypoxia O2 sat 86% on room air and stabilized on 2 L oxygen, no hypotension no tachycardia.  X-ray showed left ninth and 10th rib fracture.  CT showed similar ninth and 10th rib fracture with left lower lung atelectasis.  Hemoglobin 14.8, creatinine to 1.0.  K4.0.  Patient was given multiple rounds of pain medications and placed on observation.  Trauma surgery informed.  Review of Systems: As per HPI otherwise 14 point review of systems negative.    Past Medical History:  Diagnosis Date   COVID-19    Diabetes mellitus without complication ([place])    Dyspnea    History of hiatal hernia    Hyperglycemia    Hyperlipidemia    Hypertension    Lung nodule    Myocardial infarction [facility])    Presence of permanent cardiac pacemaker     Past Surgical History:  Procedure Laterality Date    APPENDECTOMY  1956   BACK SURGERY  2008   CARDIAC CATHETERIZATION  1995   negative results   CARDIOVERSION N/A 08/05/2017   Procedure: CARDIOVERSION;  Surgeon: Larey Dresser, MD;  Location: [place];  Service: Cardiovascular;  Laterality: N/A;   CARDIOVERSION N/A 09/12/2017   Procedure: CARDIOVERSION;  Surgeon: Larey Dresser, MD;  Location: [facility] ENDOSCOPY;  Service: Cardiovascular;  Laterality: N/A;   CARDIOVERSION N/A 09/15/2021   Procedure: CARDIOVERSION;  Surgeon: Larey Dresser, MD;  Location: [facility] ENDOSCOPY;  Service: Cardiovascular;  Laterality: N/A;   CARDIOVERSION N/A 10/12/2021   Procedure: CARDIOVERSION;  Surgeon: Larey Dresser, MD;  Location: [place];  Service: Cardiovascular;  Laterality: N/A;   COLON SURGERY  07/09/12   [place] HEMOSTASIS  01/08/2012   Procedure: HOT HEMOSTASIS (ARGON PLASMA COAGULATION/BICAP);  Surgeon: Arta Silence, MD;  Location: [facility] ENDOSCOPY;  Service: Endoscopy;  Laterality: N/A;   LAPAROSCOPIC ILEOCECECTOMY  07/09/2012   Procedure: LAPAROSCOPIC ILEOCECECTOMY;  Surgeon: Edward Jolly, MD;  Location: [facility] ORS;  Service: General;  Laterality: N/A;  Laparoscopic Ileocecectomy   PACEMAKER IMPLANT N/A 10/05/2017   Procedure: PACEMAKER IMPLANT;  Surgeon: Deboraha Sprang, MD;  Location: [facility] CV LAB;  Service: Cardiovascular;  Laterality: N/A;   REPLACEMENT TOTAL KNEE  2010   TEE WITHOUT CARDIOVERSION N/A 08/05/2017   Procedure: TRANSESOPHAGEAL ECHOCARDIOGRAM (TEE);  Surgeon: Larey Dresser, MD;  Location: [facility] ENDOSCOPY;  Service: Cardiovascular;  Laterality: N/A;  TEE WITHOUT CARDIOVERSION N/A 09/12/2017   Procedure: TRANSESOPHAGEAL ECHOCARDIOGRAM (TEE);  Surgeon: Larey Dresser, MD;  Location: [facility] ENDOSCOPY;  Service: Cardiovascular;  Laterality: N/A;     reports that he quit smoking about 30 years ago. His smoking use included cigarettes. He smoked an average of 1 pack per day. He has never used smokeless tobacco. He  reports that he does not currently use alcohol after a past usage of about 2.0 standard drinks of alcohol per week. He reports that he does not use drugs.  Allergies  Allergen Reactions   Keflex [Cephalexin] Hives and Other (See Comments)    Occurred in the 12's    Family History  Problem Relation Age of Onset   Cancer - Other Mother    CVA Father    Heart attack Brother    Heart attack Maternal Grandmother      Prior to Admission medications   Medication Sig Start Date End Date Taking? Authorizing Provider  Cholecalciferol (VITAMIN D3) 50 MCG (2000 UT) TABS Take 2,000 Units by mouth daily.   Yes [provider]  Docusate Calcium (CVS STOOL SOFTENER PO) Take by mouth. 1 tablet every other day.   Yes [provider]  ELIQUIS 5 MG TABS tablet TAKE 1 TABLET BY MOUTH TWICE DAILY . APPOINTMENT REQUIRED FOR FUTURE REFILLS 07/11/21  Yes Larey Dresser, MD  furosemide (LASIX) 40 MG tablet Take 1 tablet (40 mg total) by mouth every morning AND 0.5 tablets (20 mg total) every evening. 10/05/21 03/13/23 Yes Larey Dresser, MD  metoprolol succinate (TOPROL-XL) 25 MG 24 hr tablet Take 1 tablet by mouth once daily 02/13/22  Yes Larey Dresser, MD  midodrine (PROAMATINE) 5 MG tablet TAKE 1 TABLET BY MOUTH THREE TIMES DAILY 06/08/22  Yes Larey Dresser, MD  Omega-3 Fatty Acids (FISH OIL) 1200 MG CAPS Take 1,200 mg by mouth daily.    Yes [provider]  potassium chloride SA (KLOR-CON M) 20 MEQ tablet Take 1 tablet (20 mEq total) by mouth daily. 10/05/21  Yes Larey Dresser, MD  rosuvastatin (CRESTOR) 10 MG tablet Take 1 tablet by mouth once daily 03/13/22  Yes Larey Dresser, MD  vitamin B-12 (CYANOCOBALAMIN) 1000 MCG tablet Take 1,000 mcg by mouth daily.   Yes [provider]  acetaminophen (TYLENOL) 500 MG tablet Take 1,000 mg by mouth every 8 (eight) hours as needed for mild pain or headache.    [provider]  Menthol, Topical Analgesic,  (BIOFREEZE ROLL-ON COLORLESS) 4 % GEL Apply 1 application topically daily as needed (pain).    [provider]    Physical Exam: Vitals:   06/10/22 1335 06/10/22 1500 06/10/22 1559 06/10/22 1808  BP: 117/74 109/62 129/73 119/89  Pulse: 71 74 64 93  Resp: '12 17 18 19  '$ Temp:   97.8 F (36.6 C) 98.7 F (37.1 C)  TempSrc:   Axillary Oral  SpO2: 92% 99% 100% 93%  Weight:      Height:        Constitutional: NAD, calm, comfortable Vitals:   06/10/22 1335 06/10/22 1500 06/10/22 1559 06/10/22 1808  BP: 117/74 109/62 129/73 119/89  Pulse: 71 74 64 93  Resp: '12 17 18 19  '$ Temp:   97.8 F (36.6 C) 98.7 F (37.1 C)  TempSrc:   Axillary Oral  SpO2: 92% 99% 100% 93%  Weight:      Height:       Eyes: PERRL, lids and conjunctivae normal ENMT:  Mucous membranes are moist. Posterior pharynx clear of any exudate or lesions.Normal dentition.  Neck: normal, supple, no masses, no thyromegaly Respiratory: clear to auscultation bilaterally, no wheezing, no crackles. Normal respiratory effort. No accessory muscle use.  Cardiovascular: Regular rate and rhythm, no murmurs / rubs / gallops. No extremity edema. 2+ pedal pulses. No carotid bruits.  Abdomen: no tenderness, no masses palpated. No hepatosplenomegaly. Bowel sounds positive.  Musculoskeletal: no clubbing / cyanosis. No joint deformity upper and lower extremities. Good ROM, no contractures. Normal muscle tone.  Tenderness of lateral left lower rib cage Skin: no rashes, lesions, ulcers. No induration Neurologic: CN 2-12 grossly intact. Sensation intact, DTR normal. Strength 5/5 in all 4.  Psychiatric: Normal judgment and insight. Alert and oriented x 3. Normal mood.     Labs on Admission: I have personally reviewed following labs and imaging studies  CBC: Recent Labs  Lab 06/10/22 0953  WBC 7.3  HGB 14.8  HCT 44.3  MCV 94.3  PLT 016*   Basic Metabolic Panel: Recent Labs  Lab 06/10/22 0953  NA 140  K 4.0  CL 100  CO2  29  GLUCOSE 112*  BUN 19  CREATININE 1.06  CALCIUM 9.9   GFR: Estimated Creatinine Clearance: 54.5 mL/min (by C-G formula based on SCr of 1.06 mg/dL). Liver Function Tests: No results for input(s): "AST", "ALT", "ALKPHOS", "BILITOT", "PROT", "ALBUMIN" in the last 168 hours. No results for input(s): "LIPASE", "AMYLASE" in the last 168 hours. No results for input(s): "AMMONIA" in the last 168 hours. Coagulation Profile: No results for input(s): "INR", "PROTIME" in the last 168 hours. Cardiac Enzymes: No results for input(s): "CKTOTAL", "CKMB", "CKMBINDEX", "TROPONINI" in the last 168 hours. BNP (last 3 results) No results for input(s): "PROBNP" in the last 8760 hours. HbA1C: No results for input(s): "HGBA1C" in the last 72 hours. CBG: No results for input(s): "GLUCAP" in the last 168 hours. Lipid Profile: No results for input(s): "CHOL", "HDL", "LDLCALC", "TRIG", "CHOLHDL", "LDLDIRECT" in the last 72 hours. Thyroid Function Tests: No results for input(s): "TSH", "T4TOTAL", "FREET4", "T3FREE", "THYROIDAB" in the last 72 hours. Anemia Panel: No results for input(s): "VITAMINB12", "FOLATE", "FERRITIN", "TIBC", "IRON", "RETICCTPCT" in the last 72 hours. Urine analysis:    Component Value Date/Time   COLORURINE YELLOW 07/28/2019 [place] 07/28/2019 1244   LABSPEC 1.011 07/28/2019 1244   PHURINE 5.0 07/28/2019 1244   GLUCOSEU NEGATIVE 07/28/2019 1244   HGBUR SMALL (A) 07/28/2019 [place] 07/28/2019 [place] 07/28/2019 1244   PROTEINUR NEGATIVE 07/28/2019 1244   UROBILINOGEN 1.0 11/22/2009 1423   NITRITE NEGATIVE 07/28/2019 1244   LEUKOCYTESUR NEGATIVE 07/28/2019 1244    Radiological Exams on Admission: CT CHEST ABDOMEN PELVIS W CONTRAST  Result Date: 06/10/2022 CLINICAL DATA:  Fall, pain LEFT anterior ribs. EXAM: CT CHEST, ABDOMEN, AND PELVIS WITH CONTRAST TECHNIQUE: Multidetector CT imaging of the chest, abdomen and pelvis  was performed following the standard protocol during bolus administration of intravenous contrast. RADIATION DOSE REDUCTION: This exam was performed according to the departmental dose-optimization program which includes automated exposure control, adjustment of the mA and/or kV according to patient size and/or use of iterative reconstruction technique. CONTRAST:  40m OMNIPAQUE IOHEXOL 300 MG/ML  SOLN COMPARISON:  Chest CT dated 03/25/2007. FINDINGS: CT CHEST FINDINGS Cardiovascular: No thoracic aortic aneurysm. No pericardial effusion. Three-vessel coronary artery calcifications. Extensive atherosclerosis of the thoracic aorta. Mediastinum/Nodes: No mass or enlarged lymph nodes are seen within the mediastinum, perihilar or  axillary regions. Esophagus is unremarkable. Trachea and central bronchi are unremarkable. Lungs/Pleura: Trace LEFT pleural effusion. Mild LEFT basilar atelectasis. Lungs otherwise clear. No pneumothorax. Musculoskeletal: Displaced fractures of the posterior LEFT tenth and eleventh ribs. Old healed rib fractures bilaterally. CT ABDOMEN PELVIS FINDINGS Hepatobiliary: No hepatic injury or perihepatic hematoma. Small layering stones within the otherwise unremarkable gallbladder. No bile duct dilatation is seen. Pancreas: Partially infiltrated with fat but otherwise unremarkable. Spleen: No splenic injury or perisplenic hematoma. Adrenals/Urinary Tract: Adrenal glands appear normal. Kidneys are unremarkable without suspicious mass, stone or hydronephrosis. Bilateral renal cysts. No follow-up imaging is needed for these benign findings. No adrenal hemorrhage or renal injury identified. Bladder appears normal. Stomach/Bowel: No dilated large or small bowel loops. No evidence of bowel wall inflammation or bowel wall injury. Scattered diverticulosis of the descending and sigmoid colon but no focal inflammatory change to suggest acute diverticulitis. Stomach is unremarkable. Vascular/Lymphatic: No  abdominal aortic aneurysm. No evidence of vascular injury within the abdomen or pelvis. Extensive aortic atherosclerosis. No enlarged lymph nodes are seen in the abdomen or pelvis. Reproductive: Prostate is unremarkable. Other: No free fluid or hemorrhage within the abdomen or pelvis. No free intraperitoneal air. Musculoskeletal: No acute-appearing osseous abnormality within the abdomen or pelvis. Surgical changes of central canal decompression in the lower lumbar spine. IMPRESSION: 1. Acute displaced fractures of the posterior LEFT tenth and eleventh ribs. Additional old healed rib fractures bilaterally. 2. Trace LEFT pleural effusion. Mild LEFT basilar atelectasis. No pneumothorax. 3. No acute findings within the abdomen or pelvis. No evidence of solid organ injury. No free fluid or hemorrhage within the abdomen or pelvis. 4. Colonic diverticulosis without evidence of acute diverticulitis. 5. Cholelithiasis without evidence of acute cholecystitis. 6. Three-vessel coronary artery calcifications. Electronically Signed   By: Franki Cabot M.D.   On: 06/10/2022 11:45   DG Ribs Unilateral W/Chest Left  Result Date: 06/10/2022 CLINICAL DATA:  88 year old male status post fall last night striking left ribcage furniture. Pain. EXAM: LEFT RIBS AND CHEST - 3+ VIEW COMPARISON:  Portable chest 08/09/2019.  Chest CT 03/25/2007. FINDINGS: AP view of the chest at 0839 hours. Stable left chest dual lead cardiac pacemaker. Stable cardiac size and mediastinal contours. Borderline cardiomegaly. Visualized tracheal air column is within normal limits. Stable lung volumes. No pneumothorax or pleural effusion. Mild new patchy left mid lung and lower lung opacity. Right lung appears stable. Negative visible bowel gas. Three oblique views of the left ribs. Bone mineralization is within normal limits for age. Area of the anterior left 10th rib marked. Costochondral junction irregularity there, age indeterminate. No other left lower  rib fracture identified. Other visible osseous structures appear grossly intact. IMPRESSION: 1. Marked left 9th/10th rib area costochondral irregularity suspicious for acute fracture of the costochondral junction(s) in this setting. No other left rib fracture identified. 2. Patchy left mid and lower lung opacity is nonspecific but probably atelectasis. No pleural effusion or pneumothorax identified. Electronically Signed   By: Genevie Ann M.D.   On: 06/10/2022 08:53    EKG: Ordered  Assessment/Plan Principal Problem:   Fall at home, initial encounter Active Problems:   Closed rib fracture   Rib fracture  (please populate well all problems here in Problem List. (For example, if patient is on BP meds at home and you resume or decide to hold them, it is a problem that needs to be her. Same for CAD, COPD, HLD and so on)  Acute hypoxic respiratory failure -Secondary to acute restrictive  lung problem from splintered effect of left-sided 9th-10th rib fracture.  No paradoxus chest movement, low suspicion for flail chest -Continue incentive spirometry every hour -Ordered lidocaine patch for local pain control -Dilaudid IV for breakthrough pain -Discussed with son/POA over the phone  PAF -In sinus rhythm on physical exam, EKG pending -No significant drop of hemoglobin, or mentation changes, continue Eliquis. -Agreed with cardiologist follow-up note in July this year, patient underwent cardioversion x2 recently in January and February 2023, and has been back to persistent A-fib since February this DCCV.  Patient however claims no feeling of palpitations or lightheadedness during his fall event.  Will check PPM interrogation.  Chronic HFpEF -Euvolemic, continue home dose of Lasix, metoprolol  History of orthostatic hypotension -Denies any prodromes of lightheadedness blurry vision before the fall event, clinically looks euvolemic.  Check orthostatic vital signs -Continue midodrine  Tachybradycardia  syndrome status post PPM -PPM interrogation as above  DVT prophylaxis: Eliquis Code Status: DNR Family Communication: Son over the phone Disposition Plan: Expect less than 2 midnight hospital stay Consults called: None Admission status: MedSurg observation   Lequita Halt MD [facility] Pager [phone]  06/10/2022, 6:56 PM

## 2022-06-10 NOTE — ED Notes (Signed)
Waiting on labs and IV prior to imaging

## 2022-06-10 NOTE — Progress Notes (Signed)
Plan of Care Note for accepted transfer   Patient: Dylan [name] MRN: [id number]   [place]: 06/10/2022  Facility requesting transfer: [facility] Requesting Provider: Alvino Chapel Reason for transfer: Hypoxia  Facility course: Patient with h/o DM, HTN, HLD, and CAD with pacemaker presenting after a fall.  [place] and broke 2 ribs, scan without other findings.  Hypoxic with walking.  Likely splinting despite pain medications.  Trauma will follow.    Plan of care: The patient is accepted for observation to [place]  unit, at [facility].   Author: Karmen Bongo, MD 06/10/2022  Check [email or web address] for on-call coverage.  Nursing staff, Please call [place] number on Amion as soon as patient's arrival, so appropriate admitting provider can evaluate the pt.

## 2022-06-10 NOTE — ED Notes (Signed)
Attempted call to [facility] x2 with no answer

## 2022-06-10 NOTE — ED Triage Notes (Signed)
Pt arrives to ED with c/o fall. He reports that he fell at 10pm last night after losing his balance. He notes that he landed on a chest at the end of his bed. The chest struck his left lower abdomen. He denies LOC, head/neck injury.

## 2022-06-10 NOTE — ED Provider Notes (Signed)
[facility] EMERGENCY DEPT Provider Note   CSN: [id number] Arrival date & time: 06/10/22  4098     History  Chief Complaint  Patient presents with   Dylan [name] is a 88 y.o. male.   Fall  Patient presents after a fall last night.  States he lost his balance and tripped and landed on the chest.  Hit his left abdomen.  Now complaining some pain in his left chest.  Is on blood thinners.  Did not hit his head.  History of atrial fibrillation.  No numbness weakness.  No confusion.  Does hurt to move and mildly to take breath.  No lightheadedness or dizziness.     Home Medications Prior to Admission medications   Medication Sig Start Date End Date Taking? Authorizing Provider  acetaminophen (TYLENOL) 500 MG tablet Take 1,000 mg by mouth every 8 (eight) hours as needed for mild pain or headache.    [provider]  Cholecalciferol (VITAMIN D3) 50 MCG (2000 UT) TABS Take 2,000 Units by mouth daily.    [provider]  Docusate Calcium ([employer] STOOL SOFTENER PO) Take by mouth. 1 tablet every other day.    [provider]  ELIQUIS 5 MG TABS tablet TAKE 1 TABLET BY MOUTH TWICE DAILY . APPOINTMENT REQUIRED FOR FUTURE REFILLS 07/11/21   Larey Dresser, MD  furosemide (LASIX) 40 MG tablet Take 1 tablet (40 mg total) by mouth every morning AND 0.5 tablets (20 mg total) every evening. 10/05/21 03/13/23  Larey Dresser, MD  Menthol, Topical Analgesic, (BIOFREEZE ROLL-ON COLORLESS) 4 % GEL Apply 1 application topically daily as needed (pain).    [provider]  metoprolol succinate (TOPROL-XL) 25 MG 24 hr tablet Take 1 tablet by mouth once daily 02/13/22   Larey Dresser, MD  midodrine (PROAMATINE) 5 MG tablet TAKE 1 TABLET BY MOUTH THREE TIMES DAILY 06/08/22   Larey Dresser, MD  Omega-3 Fatty Acids (FISH OIL) 1200 MG CAPS Take 1,200 mg by mouth daily.     [provider]  potassium chloride SA (KLOR-CON M) 20 MEQ tablet  Take 1 tablet (20 mEq total) by mouth daily. 10/05/21   Larey Dresser, MD  rosuvastatin (CRESTOR) 10 MG tablet Take 1 tablet by mouth once daily 03/13/22   Larey Dresser, MD  vitamin B-12 (CYANOCOBALAMIN) 1000 MCG tablet Take 1,000 mcg by mouth daily.    [provider]      Allergies    Keflex [cephalexin]    Review of Systems   Review of Systems  Physical Exam Updated Vital Signs BP 125/84 (BP Location: Right Arm)   Pulse 69   Temp (!) 97.4 F (36.3 C) (Oral)   Resp 15   Ht 6' (1.829 m)   Wt 109.7 kg   SpO2 94%   BMI 32.80 kg/m  Physical Exam Vitals and nursing note reviewed.  HENT:     Head: Atraumatic.  Cardiovascular:     Rate and Rhythm: Normal rate. Rhythm irregular.  Pulmonary:     Comments: Some tenderness to left lateral lower chest wall.  No crepitus.  No deformity.  No subcu emphysema. Chest:     Chest wall: Tenderness present.  Abdominal:     Comments: No left upper quadrant tenderness.  No subcostal tenderness.  No ecchymosis.  Musculoskeletal:        General: No tenderness.     Cervical back: Neck supple. No tenderness.  Skin:  General: Skin is warm.     Capillary Refill: Capillary refill takes less than 2 seconds.  Neurological:     Mental Status: He is alert and oriented to person, place, and time.     ED Results / Procedures / Treatments   Labs (all labs ordered are listed, but only abnormal results are displayed) Labs Reviewed  BASIC METABOLIC PANEL - Abnormal; Notable for the following components:      Result Value   Glucose, Bld 112 (*)    All other components within normal limits  CBC - Abnormal; Notable for the following components:   Platelets 129 (*)    All other components within normal limits    EKG None  Radiology CT CHEST ABDOMEN PELVIS W CONTRAST  Result Date: 06/10/2022 CLINICAL DATA:  Fall, pain LEFT anterior ribs. EXAM: CT CHEST, ABDOMEN, AND PELVIS WITH CONTRAST TECHNIQUE: Multidetector CT imaging of  the chest, abdomen and pelvis was performed following the standard protocol during bolus administration of intravenous contrast. RADIATION DOSE REDUCTION: This exam was performed according to the departmental dose-optimization program which includes automated exposure control, adjustment of the mA and/or kV according to patient size and/or use of iterative reconstruction technique. CONTRAST:  78m OMNIPAQUE IOHEXOL 300 MG/ML  SOLN COMPARISON:  Chest CT dated 03/25/2007. FINDINGS: CT CHEST FINDINGS Cardiovascular: No thoracic aortic aneurysm. No pericardial effusion. Three-vessel coronary artery calcifications. Extensive atherosclerosis of the thoracic aorta. Mediastinum/Nodes: No mass or enlarged lymph nodes are seen within the mediastinum, perihilar or axillary regions. Esophagus is unremarkable. Trachea and central bronchi are unremarkable. Lungs/Pleura: Trace LEFT pleural effusion. Mild LEFT basilar atelectasis. Lungs otherwise clear. No pneumothorax. Musculoskeletal: Displaced fractures of the posterior LEFT tenth and eleventh ribs. Old healed rib fractures bilaterally. CT ABDOMEN PELVIS FINDINGS Hepatobiliary: No hepatic injury or perihepatic hematoma. Small layering stones within the otherwise unremarkable gallbladder. No bile duct dilatation is seen. Pancreas: Partially infiltrated with fat but otherwise unremarkable. Spleen: No splenic injury or perisplenic hematoma. Adrenals/Urinary Tract: Adrenal glands appear normal. Kidneys are unremarkable without suspicious mass, stone or hydronephrosis. Bilateral renal cysts. No follow-up imaging is needed for these benign findings. No adrenal hemorrhage or renal injury identified. Bladder appears normal. Stomach/Bowel: No dilated large or small bowel loops. No evidence of bowel wall inflammation or bowel wall injury. Scattered diverticulosis of the descending and sigmoid colon but no focal inflammatory change to suggest acute diverticulitis. Stomach is unremarkable.  Vascular/Lymphatic: No abdominal aortic aneurysm. No evidence of vascular injury within the abdomen or pelvis. Extensive aortic atherosclerosis. No enlarged lymph nodes are seen in the abdomen or pelvis. Reproductive: Prostate is unremarkable. Other: No free fluid or hemorrhage within the abdomen or pelvis. No free intraperitoneal air. Musculoskeletal: No acute-appearing osseous abnormality within the abdomen or pelvis. Surgical changes of central canal decompression in the lower lumbar spine. IMPRESSION: 1. Acute displaced fractures of the posterior LEFT tenth and eleventh ribs. Additional old healed rib fractures bilaterally. 2. Trace LEFT pleural effusion. Mild LEFT basilar atelectasis. No pneumothorax. 3. No acute findings within the abdomen or pelvis. No evidence of solid organ injury. No free fluid or hemorrhage within the abdomen or pelvis. 4. Colonic diverticulosis without evidence of acute diverticulitis. 5. Cholelithiasis without evidence of acute cholecystitis. 6. Three-vessel coronary artery calcifications. Electronically Signed   By: SFranki CabotM.D.   On: 06/10/2022 11:45   DG Ribs Unilateral W/Chest Left  Result Date: 06/10/2022 CLINICAL DATA:  88year old male status post fall last night striking left ribcage furniture. Pain. EXAM:  LEFT RIBS AND CHEST - 3+ VIEW COMPARISON:  Portable chest 08/09/2019.  Chest CT 03/25/2007. FINDINGS: AP view of the chest at 0839 hours. Stable left chest dual lead cardiac pacemaker. Stable cardiac size and mediastinal contours. Borderline cardiomegaly. Visualized tracheal air column is within normal limits. Stable lung volumes. No pneumothorax or pleural effusion. Mild new patchy left mid lung and lower lung opacity. Right lung appears stable. Negative visible bowel gas. Three oblique views of the left ribs. Bone mineralization is within normal limits for age. Area of the anterior left 10th rib marked. Costochondral junction irregularity there, age indeterminate.  No other left lower rib fracture identified. Other visible osseous structures appear grossly intact. IMPRESSION: 1. Marked left 9th/10th rib area costochondral irregularity suspicious for acute fracture of the costochondral junction(s) in this setting. No other left rib fracture identified. 2. Patchy left mid and lower lung opacity is nonspecific but probably atelectasis. No pleural effusion or pneumothorax identified. Electronically Signed   By: Genevie Ann M.D.   On: 06/10/2022 08:53    Procedures Procedures    Medications Ordered in ED Medications  iohexol (OMNIPAQUE) 300 MG/ML solution 100 mL (80 mLs Intravenous Contrast Given 06/10/22 1114)  ketorolac (TORADOL) 15 MG/ML injection 15 mg (15 mg Intravenous Given 06/10/22 1157)  fentaNYL (SUBLIMAZE) injection 50 mcg (50 mcg Intravenous Given 06/10/22 1157)    ED Course/ Medical Decision Making/ A&P                           Medical Decision Making Amount and/or Complexity of Data Reviewed Labs: ordered. Radiology: ordered.  Risk Prescription drug management.   Patient with mechanical fall last night.  However is on anticoagulation due to atrial fibrillation.  Does have some left-sided lower chest pain.  Equal breath sounds.  No left upper quadrant tenderness.  We will start with rib films and chest x-ray.  Doubt splenic or intra-abdominal injury at this time but will continue to follow.  Rib films showed 2 potential rib fractures.  Has had some mild hypoxia.  With hypoxia CT scan done.  CT scan does show this rib fractures without pneumothorax.  However patient was ambulated and sats go down to 82% even after some pain medicine.  With comorbidities and hypoxia patient required mission to the hospital.  Discussed with Dr. Grandville Silos from trauma surgery.  Recommends medicine admission and they will follow.  CRITICAL CARE Performed by: Davonna Belling Total critical care time: 30 minutes Critical care time was exclusive of separately  billable procedures and treating other patients. Critical care was necessary to treat or prevent imminent or life-threatening deterioration. Critical care was time spent personally by me on the following activities: development of treatment plan with patient and/or surrogate as well as nursing, discussions with consultants, evaluation of patient's response to treatment, examination of patient, obtaining history from patient or surrogate, ordering and performing treatments and interventions, ordering and review of laboratory studies, ordering and review of radiographic studies, pulse oximetry and re-evaluation of patient's condition.         Final Clinical Impression(s) / ED Diagnoses Final diagnoses:  Fall, initial encounter  Closed fracture of multiple ribs of left side, initial encounter    Rx / DC Orders ED Discharge Orders     None         Davonna Belling, MD 06/10/22 1317

## 2022-06-10 NOTE — Plan of Care (Signed)
  Problem: Pain Managment: Goal: General experience of comfort will improve Outcome: Progressing   Problem: Safety: Goal: Ability to remain free from injury will improve Outcome: Progressing   Problem: Skin Integrity: Goal: Risk for impaired skin integrity will decrease Outcome: Progressing   

## 2022-06-10 NOTE — Progress Notes (Addendum)
Pt arrived to room 5N09 via [place] from [facility] ED. See assessment. Will continue to monitor.

## 2022-06-10 NOTE — ED Notes (Signed)
Respiratory therapist updated on need for 1 liter nasal cannula.

## 2022-06-11 ENCOUNTER — Ambulatory Visit: Payer: Medicare HMO | Admitting: Podiatry

## 2022-06-11 DIAGNOSIS — J9621 Acute and chronic respiratory failure with hypoxia: Secondary | ICD-10-CM | POA: Diagnosis not present

## 2022-06-11 DIAGNOSIS — L8990 Pressure ulcer of unspecified site, unspecified stage: Secondary | ICD-10-CM | POA: Insufficient documentation

## 2022-06-11 DIAGNOSIS — Y92009 Unspecified place in unspecified non-institutional (private) residence as the place of occurrence of the external cause: Secondary | ICD-10-CM | POA: Diagnosis not present

## 2022-06-11 DIAGNOSIS — W19XXXA Unspecified fall, initial encounter: Secondary | ICD-10-CM | POA: Diagnosis not present

## 2022-06-11 LAB — CBC
HCT: 39.5 % (ref 39.0–52.0)
Hemoglobin: 13.2 g/dL (ref 13.0–17.0)
MCH: 31.5 pg (ref 26.0–34.0)
MCHC: 33.4 g/dL (ref 30.0–36.0)
MCV: 94.3 fL (ref 80.0–100.0)
Platelets: 126 10*3/uL — ABNORMAL LOW (ref 150–400)
RBC: 4.19 MIL/uL — ABNORMAL LOW (ref 4.22–5.81)
RDW: 13.1 % (ref 11.5–15.5)
WBC: 8.1 10*3/uL (ref 4.0–10.5)
nRBC: 0 % (ref 0.0–0.2)

## 2022-06-11 NOTE — Care Management Obs Status (Signed)
[place] NOTIFICATION   Patient Details  Name: DEONTAYE CIVELLO MRN: [id number] Date of Birth: Aug 31, 1925   Medicare Observation Status Notification Given:  Yes    Bartholomew Crews, RN 06/11/2022, 1:31 PM

## 2022-06-11 NOTE — Plan of Care (Signed)

## 2022-06-11 NOTE — Evaluation (Addendum)
Physical Therapy Evaluation Patient Details Name: Dylan [name] MRN: [id number] DOB: 05-04-1926 Today's Date: 06/11/2022  History of Present Illness  Pt is a 88 y/o male who presents s/p fall at home, hitting L side of trunk on the bed corner. Imaging revealed L 9th and 10th rib fractures. PMH significant for DM, HTN, MI s/p PPM placement, back surgery 2008, TKA 2010.   Clinical Impression  Pt admitted with above diagnosis. Pt currently with functional limitations due to the deficits listed below (see PT Problem List). At the time of PT eval pt was able to perform transfers and ambulation with gross min assist and RW for support. Pt is at a high risk for falls and recommend he have close supervision/assist at home upon d/c. Recommending HHPT follow up to maximize functional return and decrease risk for falls. Acutely, pt will benefit from skilled PT to increase their independence and safety with mobility to allow discharge to the venue listed below.       Orthostatic BPs  Sitting 115/63  Standing 94/59  Sitting after ambulation 116/73        Recommendations for follow up therapy are one component of a multi-disciplinary discharge planning process, led by the attending physician.  Recommendations may be updated based on patient status, additional functional criteria and insurance authorization.  Follow Up Recommendations Home health PT      Assistance Recommended at Discharge Frequent or constant Supervision/Assistance  Patient can return home with the following  A little help with walking and/or transfers;A little help with bathing/dressing/bathroom;Assistance with cooking/housework;Assist for transportation;Help with stairs or ramp for entrance    Equipment Recommendations None recommended by PT  Recommendations for Other Services       Functional Status Assessment Patient has had a recent decline in their functional status and demonstrates the ability to make significant  improvements in function in a reasonable and predictable amount of time.     Precautions / Restrictions Precautions Precautions: Fall Precaution Comments: L rib fractures Restrictions Weight Bearing Restrictions: No      Mobility  Bed Mobility               General bed mobility comments: Pt was received sitting EOB. Pt had scooted himself around the raised bed rail and was sitting at foot of bed with feet on the floor.    Transfers Overall transfer level: Needs assistance Equipment used: Rolling walker (2 wheels) Transfers: Sit to/from Stand Sit to Stand: Min guard           General transfer comment: Close guard for safety as pt powered up to full stand. Pt preferred to have B UE's on the RW and pushed the walker very far out in front of him to power up. No assist required however close guard provided for safety.    Ambulation/Gait Ambulation/Gait assistance: Min assist Gait Distance (Feet): 75 Feet Assistive device: Rolling walker (2 wheels) Gait Pattern/deviations: Step-through pattern, Decreased stride length, Staggering left, Drifts right/left, Trunk flexed Gait velocity: Decreased Gait velocity interpretation: <1.31 ft/sec, indicative of household ambulator   General Gait Details: Occasional min assist provided for balance support and safety as pt ambulated in hall with RW for support. Chair follow utilized for energy conservation. Pt reports lightheadedness throughout OOB mobility however BP stable upon return to the room. O2 88-95% throughout.  Stairs            Wheelchair Mobility    Modified Rankin (Stroke Patients Only)  Balance Overall balance assessment: Needs assistance Sitting-balance support: Feet supported, No upper extremity supported Sitting balance-[name]: Fair     Standing balance support: Bilateral upper extremity supported, During functional activity, Reliant on assistive device for balance Standing balance-Leahy  Scale: Poor                               Pertinent Vitals/Pain Pain Assessment Pain Assessment: Faces Faces Pain Scale: Hurts a little bit Pain Location: Ribs Pain Descriptors / Indicators: Sore Pain Intervention(s): Monitored during session, Limited activity within patient's tolerance, Repositioned    Home Living Family/patient expects to be discharged to:: Private residence Living Arrangements: Spouse/significant other Available Help at Discharge: Family;Available 24 hours/day Type of Home: House Home Access: Stairs to enter Entrance Stairs-Rails: Right Entrance Stairs-Number of Steps: 2   Home Layout: One level Home Equipment: Shower seat;Grab bars - tub/shower;Rolling Walker (2 wheels)      Prior Function Prior Level of Function : Independent/Modified Independent             Mobility Comments: Uses the walker all the time ADLs Comments: No assist required. Does not drive anymore. Thurs and fri someone comes 830-12 to do laundry, fix lunch, clean the house     Hand Dominance        Extremity/Trunk Assessment   Upper Extremity Assessment Upper Extremity Assessment: Generalized weakness    Lower Extremity Assessment Lower Extremity Assessment: Generalized weakness    Cervical / Trunk Assessment Cervical / Trunk Assessment: Kyphotic Cervical / Trunk Exceptions: Forward head posture with rounded shoulders  Communication   Communication: HOH  Cognition Arousal/Alertness: Awake/alert Behavior During Therapy: WFL for tasks assessed/performed Overall Cognitive Status: Within Functional Limits for tasks assessed                                          General Comments      Exercises     Assessment/Plan    PT Assessment Patient needs continued PT services  PT Problem List Decreased strength;Decreased activity tolerance;Decreased mobility;Decreased balance;Decreased knowledge of use of DME;Decreased safety  awareness;Decreased knowledge of precautions;Pain;Cardiopulmonary status limiting activity       PT Treatment Interventions DME instruction;Gait training;Stair training;Functional mobility training;Therapeutic activities;Therapeutic exercise;Balance training;Patient/family education    PT Goals (Current goals can be found in the Care Plan section)  Acute Rehab PT Goals Patient Stated Goal: Home today PT Goal Formulation: With patient Time For Goal Achievement: 06/18/22 Potential to Achieve Goals: Good    Frequency Min 3X/week     Co-evaluation               AM-PAC PT "6 Clicks" Mobility  Outcome Measure Help needed turning from your back to your side while in a flat bed without using bedrails?: None Help needed moving from lying on your back to sitting on the side of a flat bed without using bedrails?: None Help needed moving to and from a bed to a chair (including a wheelchair)?: A Little Help needed standing up from a chair using your arms (e.g., wheelchair or bedside chair)?: A Little Help needed to walk in hospital room?: A Little Help needed climbing 3-5 steps with a railing? : A Little 6 Click Score: 20    End of Session Equipment Utilized During Treatment: Gait belt Activity Tolerance: Patient tolerated treatment well Patient left:  in chair;with call bell/phone within reach;with chair alarm set Nurse Communication: Mobility status PT Visit Diagnosis: Unsteadiness on feet (R26.81);Pain Pain - Right/Left: Left Pain - part of body:  (Ribs)    Time: 5170-0174 PT Time Calculation (min) (ACUTE ONLY): 27 min   Charges:   PT Evaluation $PT Eval Moderate Complexity: 1 Mod PT Treatments $Gait Training: 8-22 mins        Rolinda Roan, PT, DPT Acute Rehabilitation Services Secure Chat Preferred Office: [phone]   Thelma Comp 06/11/2022, 1:57 PM

## 2022-06-11 NOTE — TOC Initial Note (Signed)
Transition of Care [facility]) - Initial/Assessment Note    Patient Details  Name: Dylan [name] MRN: [id number] Date of Birth: March 03, 1926  Transition of [facility]) CM/SW Contact:    Bartholomew Crews, RN Phone Number: [phone] 06/11/2022, 1:35 PM  Clinical Narrative:                  Spoke with patient, wife, and daughter/sil at the bedside to discuss post acute transition. PTA home with wife. Both daughter and son live nearby. Son, Marden Noble, is the Therapist, nutritional of the details, however, he was not present during this discussion. Patient has both a RW and rollator at home. Patient participates in an exercise program at a local gym on Tuesdays and Thursdays. Patient and family prefer that patient continue his usual exercise routine instead of HH. Family to make arrangements for home care assistance. Advised of plan for transition home tomorrow. Family to provide transportation home. TOC following for transition needs.   Expected Discharge Plan: Home/Self Care Barriers to Discharge: Continued Medical Work up   Patient Goals and CMS Choice Patient states their goals for this hospitalization and ongoing recovery are:: return home with wife and family/home care support CMS Medicare.gov Compare Post Acute Care list provided to:: Patient Choice offered to / list presented to : Patient, Spouse, Adult Children  Expected Discharge Plan and Services Expected Discharge Plan: Home/Self Care   Discharge Planning Services: CM Consult Post Acute Care Choice: Durable Medical Equipment, Home Health Living arrangements for the past 2 months: Single Family Home                 DME Arranged: N/A DME Agency: NA       HH Arranged: Refused [place] Agency: NA        Prior Living Arrangements/Services Living arrangements for the past 2 months: Single Family Home Lives with:: Self, Spouse Patient language and need for interpreter reviewed:: Yes Do you feel safe going back to the place where you live?: Yes       Need for Family Participation in Patient Care: Yes (Comment) Care giver support system in place?: Yes (comment) Current home services: DME (RW, rollator) Criminal Activity/Legal Involvement Pertinent to Current Situation/Hospitalization: No - Comment as needed  Activities of Daily Living Home Assistive Devices/Equipment: Cane (specify quad or straight) ADL Screening (condition at time of admission) Patient's cognitive ability adequate to safely complete daily activities?: Yes Is the patient deaf or have difficulty hearing?: Yes Does the patient have difficulty seeing, even when wearing glasses/contacts?: Yes Does the patient have difficulty concentrating, remembering, or making decisions?: Yes Patient able to express need for assistance with ADLs?: Yes Does the patient have difficulty dressing or bathing?: Yes Independently performs ADLs?: Yes (appropriate for developmental age) Does the patient have difficulty walking or climbing stairs?: Yes Weakness of Legs: Both Weakness of Arms/Hands: None  Permission Sought/Granted Permission sought to share information with : Family Supports    Share Information with NAME: Sandra Tellefsen     Permission granted to share info w Relationship: son  Permission granted to share info w Contact Information: [phone]  Emotional Assessment Appearance:: Appears stated age Attitude/Demeanor/Rapport: Engaged Affect (typically observed): Accepting Orientation: : Oriented to Self, Oriented to Place, Oriented to  Time, Oriented to Situation Alcohol / Substance Use: Not Applicable Psych Involvement: No (comment)  Admission diagnosis:  Fall, initial encounter [W19.XXXA] Fall at home, initial encounter [phone], Y92.009] Closed fracture of multiple ribs of left side, initial encounter [S22.42XA] Rib fracture [  S22.39XA] Patient Active Problem List   Diagnosis Date Noted   Pressure injury of skin 06/11/2022   Fall at home, initial encounter 06/10/2022    Closed rib fracture 06/10/2022   Rib fracture 06/10/2022   PVD (peripheral vascular disease) ([place]) 12/04/2021   Actinic keratosis 02/06/2021   Cough 02/06/2021   Encounter for general adult medical examination without abnormal findings 02/06/2021   Hyperglycemia 02/06/2021   Neuropathy 02/06/2021   Pure hypercholesterolemia 02/06/2021   Sinusitis 02/06/2021   Sinus node dysfunction ([place]) 01/11/2020   Pacemaker 01/11/2020   Pain due to onychomycosis of toenails of both feet 10/14/2019   Diabetic neuropathy ([place]) 10/14/2019   Type 2 diabetes mellitus with vascular disease ([place]) 10/14/2019   Coagulation disorder ([place]) 10/14/2019   Chronic diastolic CHF (congestive heart failure) ([place]) 07/28/2019   Pneumonia due to COVID-19 virus 07/28/2019   Periorbital hematoma of left eye 10/15/2017   Fall 10/15/2017   HCAP (healthcare-associated pneumonia) 10/14/2017   Bradycardia 10/03/2017   Acute CHF (congestive heart failure) ([place]) 08/03/2017   Atrial fibrillation ([place]) 00/93/8182   Diastolic dysfunction with acute on chronic heart failure ([place]) 07/14/2017   DOE (dyspnea on exertion) 08/24/2014   Benign essential HTN 08/24/2014   Type 2 diabetes mellitus without complication ([place]) 99/37/1696   Dyslipidemia 08/24/2014   PCP:  Haywood Pao, MD Pharmacy:   [facility], [address] [place] [address] Phone: [phone] Fax: [phone]  [facility], [place] [facility] [address] [place] [address] Phone: [phone] Fax: [phone]     Social Determinants of Health (SDOH) Interventions    Readmission Risk Interventions     No data to display

## 2022-06-11 NOTE — Progress Notes (Signed)
PROGRESS NOTE    Dylan [name]  [name]:[id number] DOB: 06-Jun-1926 DOA: 06/10/2022 PCP: Haywood Pao, MD   Brief Narrative:  HPI: Dylan [name] is a 88 y.o. male with medical history significant of PAF status post cardioversion, on Eliquis, HTN, chronic HFpEF, chronic orthostatic hypotension on midodrine, PPM, HCOM, MI CAD, presented to [facility] ED after a fall.   2 nights ago around 10 PM, patient fell at home, and he was trying to turn around at the bedside, he lost balance and left-sided chest hit the corner of the bed.  Denies any head or neck injury, no LOC.  Denies any prodromes of lightheadedness blurry vision shortness of breath or palpitations.  Overnight, patient developed severe pain associated with left-sided rib cage, has been sharp, constant 10/10, worsening with deep breath and cough or upper torso movement.   ED Course: Patient was found hypoxia O2 sat 86% on room air and stabilized on 2 L oxygen, no hypotension no tachycardia.  X-ray showed left ninth and 10th rib fracture.  CT showed similar ninth and 10th rib fracture with left lower lung atelectasis.  Hemoglobin 14.8, creatinine to 1.0.  K4.0.  Patient was given multiple rounds of pain medications and placed on observation.  Trauma surgery informed.  Assessment & Plan:   Principal Problem:   Fall at home, initial encounter Active Problems:   Closed rib fracture   Rib fracture   Pressure injury of skin  Fall leading to ninth and 10th rib fracture: Patient pain is better controlled.  Continue current management.  Acute hypoxic respiratory failure/atelectasis: Likely secondary to rib fractures.  Patient encouraged to use incentive incentive spirometry.  Currently on room air and saturating 88% with activity.  Monitor off of oxygen for now.  Paroxysmal atrial fibrillation: Controlled.  Continue Eliquis.  Patient is status post cardioversion x2 recently in January and February 2023.  Continue metoprolol.  Chronic  congestive heart failure with preserved ejection fraction: Euvolemic.  Continue home dose of Lasix and metoprolol.  History of orthostatic hypotension: Currently doing well.  Continue midodrine.  Tachybradycardia syndrome s/p PPM.  DVT prophylaxis:   Eliquis   Code Status: DNR  Family Communication:  None present at bedside.  Plan of care discussed with patient in length and he/she verbalized understanding and agreed with it.  Status is: Observation The patient will require care spanning > 2 midnights and should be moved to inpatient because: Seen by PT OT, home health recommended however patient and family does not feel comfortable going home today.   Estimated body mass index is 32.8 kg/m as calculated from the following:   Height as of this encounter: 6' (1.829 m).   Weight as of this encounter: 109.7 kg.  Pressure Injury 06/10/22 Buttocks Left;Lower;Anterior Stage 2 -  Partial thickness loss of dermis presenting as a shallow open injury with a red, pink wound bed without slough. small size of a pea (Active)  06/10/22 1849  Location: Buttocks  Location Orientation: Left;Lower;Anterior  Staging: Stage 2 -  Partial thickness loss of dermis presenting as a shallow open injury with a red, pink wound bed without slough.  Wound Description (Comments): small size of a pea  Present on Admission: Yes  Dressing Type Foam - Lift dressing to assess site every shift 06/11/22 0845   Nutritional Assessment: Body mass index is 32.8 kg/m.[place] Kitchen Seen by dietician.  I agree with the assessment and plan as outlined below: Nutrition Status:        .  Skin Assessment: I have examined the patient's skin and I agree with the wound assessment as performed by the wound care RN as outlined below: Pressure Injury 06/10/22 Buttocks Left;Lower;Anterior Stage 2 -  Partial thickness loss of dermis presenting as a shallow open injury with a red, pink wound bed without slough. small size of a pea (Active)   06/10/22 1849  Location: Buttocks  Location Orientation: Left;Lower;Anterior  Staging: Stage 2 -  Partial thickness loss of dermis presenting as a shallow open injury with a red, pink wound bed without slough.  Wound Description (Comments): small size of a pea  Present on Admission: Yes  Dressing Type Foam - Lift dressing to assess site every shift 06/11/22 0845    Consultants:  None  Procedures:  None  Antimicrobials:  Anti-infectives (From admission, onward)    None         Subjective: Patient seen and examined.  Still complains of left lower rib pain but improving.  No shortness of breath.  No other complaint.  Seen by PT OT and they recommended home health.  Discussed plans of discharge with the patient however he is not comfortable going home today.  They need some time to arrange some equipment.  Objective: Vitals:   06/10/22 1500 06/10/22 1559 06/10/22 1808 06/11/22 0842  BP: 109/62 129/73 119/89 (!) 121/58  Pulse: 74 64 93   Resp: '17 18 19 18  '$ Temp:  97.8 F (36.6 C) 98.7 F (37.1 C) 98 F (36.7 C)  TempSrc:  Axillary Oral Oral  SpO2: 99% 100% 93% 94%  Weight:      Height:       No intake or output data in the 24 hours ending 06/11/22 1359 Filed Weights   06/10/22 0810  Weight: 109.7 kg    Examination:  General exam: Appears calm and comfortable  Respiratory system: Clear to auscultation. Respiratory effort normal.  Mild tenderness at the left lower lateral chest. Cardiovascular system: S1 & S2 heard, RRR. No JVD, murmurs, rubs, gallops or clicks. No pedal edema. Gastrointestinal system: Abdomen is nondistended, soft and nontender. No organomegaly or masses felt. Normal bowel sounds heard. Central nervous system: Alert and oriented. No focal neurological deficits. Extremities: Symmetric 5 x 5 power. Skin: No rashes, lesions or ulcers Psychiatry: Judgement and insight appear normal. Mood & affect appropriate.    Data Reviewed: I have personally  reviewed following labs and imaging studies  CBC: Recent Labs  Lab 06/10/22 0953 06/11/22 0242  WBC 7.3 8.1  HGB 14.8 13.2  HCT 44.3 39.5  MCV 94.3 94.3  PLT 129* 659*   Basic Metabolic Panel: Recent Labs  Lab 06/10/22 0953  NA 140  K 4.0  CL 100  CO2 29  GLUCOSE 112*  BUN 19  CREATININE 1.06  CALCIUM 9.9   GFR: Estimated Creatinine Clearance: 54.5 mL/min (by C-G formula based on SCr of 1.06 mg/dL). Liver Function Tests: No results for input(s): "AST", "ALT", "ALKPHOS", "BILITOT", "PROT", "ALBUMIN" in the last 168 hours. No results for input(s): "LIPASE", "AMYLASE" in the last 168 hours. No results for input(s): "AMMONIA" in the last 168 hours. Coagulation Profile: No results for input(s): "INR", "PROTIME" in the last 168 hours. Cardiac Enzymes: No results for input(s): "CKTOTAL", "CKMB", "CKMBINDEX", "TROPONINI" in the last 168 hours. BNP (last 3 results) No results for input(s): "PROBNP" in the last 8760 hours. HbA1C: No results for input(s): "HGBA1C" in the last 72 hours. CBG: No results for input(s): "GLUCAP" in the last 168  hours. Lipid Profile: No results for input(s): "CHOL", "HDL", "LDLCALC", "TRIG", "CHOLHDL", "LDLDIRECT" in the last 72 hours. Thyroid Function Tests: No results for input(s): "TSH", "T4TOTAL", "FREET4", "T3FREE", "THYROIDAB" in the last 72 hours. Anemia Panel: No results for input(s): "VITAMINB12", "FOLATE", "FERRITIN", "TIBC", "IRON", "RETICCTPCT" in the last 72 hours. Sepsis Labs: No results for input(s): "PROCALCITON", "LATICACIDVEN" in the last 168 hours.  No results found for this or any previous visit (from the past 240 hour(s)).   Radiology Studies: CT CHEST ABDOMEN PELVIS W CONTRAST  Result Date: 06/10/2022 CLINICAL DATA:  Fall, pain LEFT anterior ribs. EXAM: CT CHEST, ABDOMEN, AND PELVIS WITH CONTRAST TECHNIQUE: Multidetector CT imaging of the chest, abdomen and pelvis was performed following the standard protocol during bolus  administration of intravenous contrast. RADIATION DOSE REDUCTION: This exam was performed according to the departmental dose-optimization program which includes automated exposure control, adjustment of the mA and/or kV according to patient size and/or use of iterative reconstruction technique. CONTRAST:  58m OMNIPAQUE IOHEXOL 300 MG/ML  SOLN COMPARISON:  Chest CT dated 03/25/2007. FINDINGS: CT CHEST FINDINGS Cardiovascular: No thoracic aortic aneurysm. No pericardial effusion. Three-vessel coronary artery calcifications. Extensive atherosclerosis of the thoracic aorta. Mediastinum/Nodes: No mass or enlarged lymph nodes are seen within the mediastinum, perihilar or axillary regions. Esophagus is unremarkable. Trachea and central bronchi are unremarkable. Lungs/Pleura: Trace LEFT pleural effusion. Mild LEFT basilar atelectasis. Lungs otherwise clear. No pneumothorax. Musculoskeletal: Displaced fractures of the posterior LEFT tenth and eleventh ribs. Old healed rib fractures bilaterally. CT ABDOMEN PELVIS FINDINGS Hepatobiliary: No hepatic injury or perihepatic hematoma. Small layering stones within the otherwise unremarkable gallbladder. No bile duct dilatation is seen. Pancreas: Partially infiltrated with fat but otherwise unremarkable. Spleen: No splenic injury or perisplenic hematoma. Adrenals/Urinary Tract: Adrenal glands appear normal. Kidneys are unremarkable without suspicious mass, stone or hydronephrosis. Bilateral renal cysts. No follow-up imaging is needed for these benign findings. No adrenal hemorrhage or renal injury identified. Bladder appears normal. Stomach/Bowel: No dilated large or small bowel loops. No evidence of bowel wall inflammation or bowel wall injury. Scattered diverticulosis of the descending and sigmoid colon but no focal inflammatory change to suggest acute diverticulitis. Stomach is unremarkable. Vascular/Lymphatic: No abdominal aortic aneurysm. No evidence of vascular injury within  the abdomen or pelvis. Extensive aortic atherosclerosis. No enlarged lymph nodes are seen in the abdomen or pelvis. Reproductive: Prostate is unremarkable. Other: No free fluid or hemorrhage within the abdomen or pelvis. No free intraperitoneal air. Musculoskeletal: No acute-appearing osseous abnormality within the abdomen or pelvis. Surgical changes of central canal decompression in the lower lumbar spine. IMPRESSION: 1. Acute displaced fractures of the posterior LEFT tenth and eleventh ribs. Additional old healed rib fractures bilaterally. 2. Trace LEFT pleural effusion. Mild LEFT basilar atelectasis. No pneumothorax. 3. No acute findings within the abdomen or pelvis. No evidence of solid organ injury. No free fluid or hemorrhage within the abdomen or pelvis. 4. Colonic diverticulosis without evidence of acute diverticulitis. 5. Cholelithiasis without evidence of acute cholecystitis. 6. Three-vessel coronary artery calcifications. Electronically Signed   By: SFranki CabotM.D.   On: 06/10/2022 11:45   DG Ribs Unilateral W/Chest Left  Result Date: 06/10/2022 CLINICAL DATA:  88year old male status post fall last night striking left ribcage furniture. Pain. EXAM: LEFT RIBS AND CHEST - 3+ VIEW COMPARISON:  Portable chest 08/09/2019.  Chest CT 03/25/2007. FINDINGS: AP view of the chest at 0839 hours. Stable left chest dual lead cardiac pacemaker. Stable cardiac size and mediastinal contours. Borderline cardiomegaly.  Visualized tracheal air column is within normal limits. Stable lung volumes. No pneumothorax or pleural effusion. Mild new patchy left mid lung and lower lung opacity. Right lung appears stable. Negative visible bowel gas. Three oblique views of the left ribs. Bone mineralization is within normal limits for age. Area of the anterior left 10th rib marked. Costochondral junction irregularity there, age indeterminate. No other left lower rib fracture identified. Other visible osseous structures appear  grossly intact. IMPRESSION: 1. Marked left 9th/10th rib area costochondral irregularity suspicious for acute fracture of the costochondral junction(s) in this setting. No other left rib fracture identified. 2. Patchy left mid and lower lung opacity is nonspecific but probably atelectasis. No pleural effusion or pneumothorax identified. Electronically Signed   By: Genevie Ann M.D.   On: 06/10/2022 08:53    Scheduled Meds:  apixaban  5 mg Oral BID   cholecalciferol  2,000 Units Oral Daily   cyanocobalamin  1,000 mcg Oral Daily   furosemide  20 mg Oral QPM   furosemide  40 mg Oral q morning   lidocaine  1 patch Transdermal Q24H   metoprolol succinate  25 mg Oral Daily   midodrine  5 mg Oral TID   potassium chloride SA  20 mEq Oral Daily   rosuvastatin  10 mg Oral Daily   Continuous Infusions:   LOS: 0 days   Darliss Cheney, MD [facility]  06/11/2022, 1:59 PM   *Please note that this is a verbal dictation therefore any spelling or grammatical errors are due to the "[place]" system interpretation.  Please page via [place] and do not message via secure chat for urgent patient care matters. Secure chat can be used for non urgent patient care matters.  How to contact the [facility] Attending or Consulting provider [place] or covering provider during after hours [place], for this patient?  Check the care team in [facility] and look for a) attending/consulting TR[facility] provider listed and b) the [facility] team listed. Page or secure chat [phone]. Log into [email or web address] and use [place]'s universal password to access. If you do not have the password, please contact the hospital operator. Locate the [facility] provider you are looking for under [facility] and page to a number that you can be directly reached. If you still have difficulty reaching the provider, please page the [facility] (Director on Call) for the Hospitalists listed on amion for assistance.

## 2022-06-11 NOTE — Evaluation (Signed)
Occupational Therapy Evaluation Patient Details Name: Dylan [name] MRN: [id number] DOB: 05-30-1926 Today's Date: 06/11/2022   History of Present Illness Pt is a 88 y/o male who presents s/p fall at home, hitting L side of trunk on the bed corner. Imaging revealed L 9th and 10th rib fractures. PMH significant for DM, HTN, MI s/p PPM placement, back surgery 2008, TKA 2010.   Clinical Impression   Dylan [name] was evaluated s/p the above admission list, he is typically mod I at baseline. Upon evaluation pt had functional deficits due to L flank pain and decreased activity tolerance. Overall he requires up to min A for ADLs and min G fro all transfers and mobility with RW. OT to continue to follow acutely. Recommend d/c to home without follow up OT.      Recommendations for follow up therapy are one component of a multi-disciplinary discharge planning process, led by the attending physician.  Recommendations may be updated based on patient status, additional functional criteria and insurance authorization.   Follow Up Recommendations  No OT follow up (pt plans to resume regular exercise sesssions)    Assistance Recommended at Discharge Frequent or constant Supervision/Assistance  Patient can return home with the following A little help with walking and/or transfers;A little help with bathing/dressing/bathroom;Assistance with cooking/housework;Assist for transportation    Functional Status Assessment  Patient has had a recent decline in their functional status and demonstrates the ability to make significant improvements in function in a reasonable and predictable amount of time.  Equipment Recommendations  None recommended by OT    Recommendations for Other Services       Precautions / Restrictions Precautions Precautions: Fall Precaution Comments: L rib fractures Restrictions Weight Bearing Restrictions: No      Mobility Bed Mobility               General bed mobility comments:  OOB in chair. returned to chair. educated log roll and pillow splinting    Transfers Overall transfer level: Needs assistance Equipment used: Rolling walker (2 wheels) Transfers: Sit to/from Stand Sit to Stand: Min guard                  Balance Overall balance assessment: Needs assistance Sitting-balance support: Feet supported, No upper extremity supported Sitting balance-[name]: Fair     Standing balance support: Bilateral upper extremity supported, During functional activity, Reliant on assistive device for balance Standing balance-[name]: Poor                             ADL either performed or assessed with clinical judgement   ADL Overall ADL's : Needs assistance/impaired Eating/Feeding: Independent;Sitting   Grooming: Set up;Sitting   Upper Body Bathing: Set up;Sitting   Lower Body Bathing: Minimal assistance;Sit to/from stand   Upper Body Dressing : Set up;Sitting   Lower Body Dressing: Minimal assistance;Sit to/from stand   Toilet Transfer: Min guard;Ambulation;Rolling walker (2 wheels)   Toileting- Clothing Manipulation and Hygiene: Min guard;Sitting/lateral lean       Functional mobility during ADLs: Min guard;Rolling walker (2 wheels) General ADL Comments: min A for LB tasks due to pain     Vision Baseline Vision/History: 0 No visual deficits Vision Assessment?: No apparent visual deficits     Perception     Praxis      Pertinent Vitals/Pain Pain Assessment Pain Assessment: Faces Faces Pain Scale: Hurts little more Pain Location: L ribs Pain Descriptors / Indicators:  Sore Pain Intervention(s): Limited activity within patient's tolerance, Monitored during session     Hand Dominance     Extremity/Trunk Assessment Upper Extremity Assessment Upper Extremity Assessment: Generalized weakness;LUE deficits/detail LUE Deficits / Details: limited assessment due to pain with ROM LUE Coordination: decreased fine  motor;decreased gross motor   Lower Extremity Assessment Lower Extremity Assessment: Generalized weakness   Cervical / Trunk Assessment Cervical / Trunk Assessment: Kyphotic Cervical / Trunk Exceptions: Forward head posture with rounded shoulders   Communication Communication Communication: HOH   Cognition Arousal/Alertness: Awake/alert Behavior During Therapy: WFL for tasks assessed/performed Overall Cognitive Status: Within Functional Limits for tasks assessed                                       General Comments  VSS on RA    Exercises     Shoulder Instructions      Home Living Family/patient expects to be discharged to:: Private residence Living Arrangements: Spouse/significant other Available Help at Discharge: Family;Available 24 hours/day Type of Home: House Home Access: Stairs to enter [employer] of Steps: 2 Entrance Stairs-Rails: Right Home Layout: One level     Bathroom Shower/Tub: Occupational psychologist: Standard     Home Equipment: Shower seat;Grab bars - tub/shower;Rolling Environmental consultant (2 wheels)          Prior Functioning/Environment Prior Level of Function : Independent/Modified Independent             Mobility Comments: Uses the walker all the time ADLs Comments: No assist required. Does not drive anymore. Thurs and fri someone comes 830-12 to do laundry, fix lunch, clean the house        OT Problem List: Decreased strength;Decreased range of motion;Decreased activity tolerance;Pain      OT Treatment/Interventions: Self-care/ADL training;Therapeutic exercise;DME and/or AE instruction;Therapeutic activities;Patient/family education;Balance training    OT Goals(Current goals can be found in the care plan section) Acute Rehab OT Goals Patient Stated Goal: home soon OT Goal Formulation: With patient Time For Goal Achievement: 06/25/22 Potential to Achieve Goals: Good ADL Goals Additional ADL Goal #1:  Pt will complete BADLs with mod I Additional ADL Goal #2: Pt will complete bed mobility with mod I  OT Frequency: Min 2X/week    Co-evaluation              AM-PAC OT "6 Clicks" Daily Activity     Outcome Measure Help from another person eating meals?: None Help from another person taking care of personal grooming?: A Little Help from another person toileting, which includes using toliet, bedpan, or urinal?: A Little Help from another person bathing (including washing, rinsing, drying)?: A Little Help from another person to put on and taking off regular upper body clothing?: A Little Help from another person to put on and taking off regular lower body clothing?: A Little 6 Click Score: 19   End of Session Equipment Utilized During Treatment: Rolling walker (2 wheels) Nurse Communication: Mobility status  Activity Tolerance: Patient tolerated treatment well Patient left: in chair;with call bell/phone within reach  OT Visit Diagnosis: Other abnormalities of gait and mobility (R26.89);Unsteadiness on feet (R26.81);Muscle weakness (generalized) (M62.81);Pain                Time: 1400-1420 OT Time Calculation (min): 20 min Charges:  OT General Charges $OT Visit: 1 Visit OT Evaluation $OT Eval Moderate Complexity: 1 Mod  Elliot Cousin 06/11/2022, 2:30 PM

## 2022-06-12 DIAGNOSIS — L8990 Pressure ulcer of unspecified site, unspecified stage: Secondary | ICD-10-CM | POA: Diagnosis not present

## 2022-06-12 DIAGNOSIS — W19XXXA Unspecified fall, initial encounter: Secondary | ICD-10-CM | POA: Diagnosis not present

## 2022-06-12 DIAGNOSIS — J9621 Acute and chronic respiratory failure with hypoxia: Secondary | ICD-10-CM | POA: Diagnosis not present

## 2022-06-12 DIAGNOSIS — Y92009 Unspecified place in unspecified non-institutional (private) residence as the place of occurrence of the external cause: Secondary | ICD-10-CM | POA: Diagnosis not present

## 2022-06-12 MED ORDER — OXYCODONE HCL 5 MG PO TABS: 5.0000 mg | ORAL_TABLET | ORAL | 0 refills | Status: DC | PRN

## 2022-06-12 NOTE — Progress Notes (Signed)
Physical Therapy Treatment Patient Details Name: Dylan [name] MRN: [id number] DOB: 1926-07-12 Today's Date: 06/12/2022   History of Present Illness Pt is a 88 y/o male who presents s/p fall at home, hitting L side of trunk on the bed corner. Imaging revealed L 9th and 10th rib fractures. PMH significant for DM, HTN, MI s/p PPM placement, back surgery 2008, TKA 2010.    PT Comments    Pt received in chair, pt son present and both agreeable to therapy session with emphasis on stair negotiation and transfer training/safe gait progression with RW. Pt limited due to fatigue after recently working with mobility specialist on longer gait trial. Pt needing up to minA for transfers/gait and min guard for stair negotiation with single rail, pt needing max safety cues due to poor RW management which increases his fall risk. Pt and son notified he will benefit from initial 24/7 supervision/assist at home for safety due to high fall risk and new rib pain/injury. Pt/son receptive, gait belt also given for safety as he is discharging home today. Encouraged pt/family to consider HHPT services and pt agreeable after discussion with his son. Pt continues to benefit from PT services to progress toward functional mobility goals.   Recommendations for follow up therapy are one component of a multi-disciplinary discharge planning process, led by the attending physician.  Recommendations may be updated based on patient status, additional functional criteria and insurance authorization.  Follow Up Recommendations  Home health PT     Assistance Recommended at Discharge Frequent or constant Supervision/Assistance  Patient can return home with the following A little help with walking and/or transfers;A little help with bathing/dressing/bathroom;Assistance with cooking/housework;Assist for transportation;Help with stairs or ramp for entrance   Equipment Recommendations  None recommended by PT    Recommendations for  Other Services OT consult (may benefit from [facility] as he is frequently home alone and poor safety awareness and does have ADL assist needs)     Precautions / Restrictions Precautions Precautions: Fall Precaution Comments: L rib fractures, poor safety awareness Restrictions Weight Bearing Restrictions: No     Mobility  Bed Mobility               General bed mobility comments: OOB in chair. returned to chair. educated log roll and pillow splinting    Transfers Overall transfer level: Needs assistance Equipment used: Rolling walker (2 wheels) Transfers: Sit to/from Stand Sit to Stand: Min guard, Min assist           General transfer comment: Close guard for safety as pt powered up to full stand. Pt needs reminders for safe UE placement and not to abandon RW prior to sitting.    Ambulation/Gait Ambulation/Gait assistance: Min assist (son standing by for instruction on guarding techniques/pt safety) Gait Distance (Feet): 10 Feet Assistive device: Rolling walker (2 wheels) Gait Pattern/deviations: Step-through pattern, Decreased stride length, Drifts right/left, Trunk flexed       General Gait Details: Occasional min assist provided for balance support and safety while turning as pt tending to abandon RW prior to sitting and much less stable without BUE support. Pt son given gait belt and instruction on guarding techniques for gait/stairs and receptive.   Stairs Stairs: Yes Stairs assistance: Min guard, +2 safety/equipment Stair Management: One rail Right, Step to pattern, Forwards Number of Stairs: 2 General stair comments: mod cues for step sequencing/safety, pt without overt LOB, son present and receptive to instruction on safety and use of gait belt with guarding positions  ascending/descending. Recommend currently he have assist each time he needs to perform stairs given increased fall risk.     Balance Overall balance assessment: Needs assistance Sitting-balance  support: Feet supported, No upper extremity supported Sitting balance-[name]: Fair     Standing balance support: Bilateral upper extremity supported, During functional activity, Reliant on assistive device for balance Standing balance-[name]: Poor Standing balance comment: fair static standing balance with RW but pt impulsive to let go and needs constant min guard for safety due to this. poor balance with U UE support or when unsupported.                            Cognition Arousal/Alertness: Awake/alert Behavior During Therapy: WFL for tasks assessed/performed Overall Cognitive Status: Within Functional Limits for tasks assessed                                 General Comments: Poor carryover of cueing for RW management and transfer safety noted within session, pt needing max multimodal cues not to abandon RW prior to sitting even when >41f away from chair/sink. Pt son present and assisting PTA to reinforce this with him. Pt son agreeable that he will benefit from HHPT upon DC and calling case mgr to arrange this during session.        Exercises Other Exercises Other Exercises: IS x 10 reps, pt achieves 1250-1700, cues for technique/frequency, splinting and coughing to clear secretions PRN    General Comments General comments (skin integrity, edema, etc.): VSS on RA      Pertinent Vitals/Pain Pain Assessment Pain Assessment: Faces Faces Pain Scale: Hurts little more Pain Location: L ribs Pain Descriptors / Indicators: Sore Pain Intervention(s): Monitored during session, Repositioned, Other (comment) (reviewed manual splinting)           PT Goals (current goals can now be found in the care plan section) Acute Rehab PT Goals Patient Stated Goal: Home today PT Goal Formulation: With patient Time For Goal Achievement: 06/18/22 Progress towards PT goals: Progressing toward goals    Frequency    Min 3X/week      PT Plan Current plan  remains appropriate       AM-PAC PT "6 Clicks" Mobility   Outcome Measure  Help needed turning from your back to your side while in a flat bed without using bedrails?: None Help needed moving from lying on your back to sitting on the side of a flat bed without using bedrails?: None Help needed moving to and from a bed to a chair (including a wheelchair)?: A Little Help needed standing up from a chair using your arms (e.g., wheelchair or bedside chair)?: A Lot (mod safety cues needed) Help needed to walk in hospital room?: A Little Help needed climbing 3-5 steps with a railing? : A Little 6 Click Score: 19    End of Session Equipment Utilized During Treatment: Gait belt Activity Tolerance: Patient limited by fatigue;Patient tolerated treatment well (recently worked with mobility specialist) Patient left: in chair;with call bell/phone within reach;with chair alarm set;with family/visitor present Nurse Communication: Mobility status;Precautions;Other (comment) (poor RW management, high fall risk) PT Visit Diagnosis: Unsteadiness on feet (R26.81);Pain Pain - Right/Left: Left Pain - part of body:  (ribs)     Time: 10263-7858PT Time Calculation (min) (ACUTE ONLY): 19 min  Charges:  $Gait Training: 8-22 mins  Houston Siren., PTA Acute Rehabilitation Services Secure Chat Preferred 9a-5:30pm Office: [place] 06/12/2022, 3:42 PM

## 2022-06-12 NOTE — Progress Notes (Signed)
Physical Therapy Treatment Patient Details Name: Dylan [name] MRN: [id number] DOB: 04-17-1926 Today's Date: 06/12/2022   History of Present Illness Pt is a 88 y/o male who presents s/p fall at home, hitting L side of trunk on the bed corner. Imaging revealed L 9th and 10th rib fractures. PMH significant for DM, HTN, MI s/p PPM placement, back surgery 2008, TKA 2010.    PT Comments    Pt received in chair, attempting to stand with chair alarm sounding, PTA called to room as RN busy in another room. Pt requesting assist to get to bathroom due to bowel urgency and agreeable to therapy session with emphasis on fall risk precautions, safe use of RW, transfer safety, activity pacing and standing balance. Pt given HEP handout and instruction on activity pacing, increased time between transfers given pt hx dizziness, compliance with compression socks (none in room but pt reports he wears them at home). Pt dizzy with ambulation short distance but UTA BP with transfers due to pt bowel urgency. Pt reports dizziness improved once he takes a seated break. Pt continues to benefit from PT services to progress toward functional mobility goals.    Recommendations for follow up therapy are one component of a multi-disciplinary discharge planning process, led by the attending physician.  Recommendations may be updated based on patient status, additional functional criteria and insurance authorization.  Follow Up Recommendations  Home health PT     Assistance Recommended at Discharge Frequent or constant Supervision/Assistance  Patient can return home with the following A little help with walking and/or transfers;A little help with bathing/dressing/bathroom;Assistance with cooking/housework;Assist for transportation;Help with stairs or ramp for entrance   Equipment Recommendations  None recommended by PT (already has RW)    Recommendations for Other Services OT consult (may benefit from [facility] as he is  frequently home alone and poor safety awareness and does have ADL assist needs)     Precautions / Restrictions Precautions Precautions: Fall Precaution Comments: L rib fractures, poor safety awareness Restrictions Weight Bearing Restrictions: No     Mobility  Bed Mobility               General bed mobility comments: OOB in chair. returned to chair.    Transfers Overall transfer level: Needs assistance Equipment used: Rolling walker (2 wheels) Transfers: Sit to/from Stand Sit to Stand: Min guard           General transfer comment: from chair<>RW and RW<>BSC height using wall rail support    Ambulation/Gait Ambulation/Gait assistance: Min assist, Min guard Gait Distance (Feet): 18 Feet (x2 to/from bathroom) Assistive device: Rolling walker (2 wheels) Gait Pattern/deviations: Step-through pattern, Decreased stride length, Drifts right/left, Trunk flexed       General Gait Details: max safety cues during turns for RW proximity, pt with poor carryover from previous session <30 mins prior and tending to abandon RW to the side prior to getting close to seated surfaces.      Balance Overall balance assessment: Needs assistance Sitting-balance support: Feet supported, No upper extremity supported Sitting balance-[name]: Fair     Standing balance support: Bilateral upper extremity supported, During functional activity, Reliant on assistive device for balance Standing balance-[name]: Poor Standing balance comment: fair static standing balance with RW but pt impulsive to let go and needs constant min guard for safety due to this. poor balance with U UE support or when unsupported.  Cognition Arousal/Alertness: Awake/alert Behavior During Therapy: WFL for tasks assessed/performed Overall Cognitive Status: Within Functional Limits for tasks assessed                                 General Comments: Poor  carryover of cueing for RW management and transfer safety noted within session, pt needing max multimodal cues not to abandon RW prior to sitting even when >74f away from chair/sink. Pt son present and assisting PTA to reinforce this with him.        Exercises Other Exercises Other Exercises: seated LE HEP handout given and reviewed verbally pt/son receptive, encouraged compliance BID/TID until HHPT progresses him to higher level exercises. Pt too fatigued to perform after ambulation and previous session for stairs    General Comments General comments (skin integrity, edema, etc.): VSS on RA      Pertinent Vitals/Pain Pain Assessment Pain Assessment: Faces Faces Pain Scale: Hurts little more Pain Location: L ribs Pain Descriptors / Indicators: Sore Pain Intervention(s): Monitored during session     PT Goals (current goals can now be found in the care plan section) Acute Rehab PT Goals Patient Stated Goal: Home today PT Goal Formulation: With patient Time For Goal Achievement: 06/18/22 Progress towards PT goals: Progressing toward goals    Frequency    Min 3X/week      PT Plan Current plan remains appropriate       AM-PAC PT "6 Clicks" Mobility   Outcome Measure  Help needed turning from your back to your side while in a flat bed without using bedrails?: None Help needed moving from lying on your back to sitting on the side of a flat bed without using bedrails?: None Help needed moving to and from a bed to a chair (including a wheelchair)?: A Little Help needed standing up from a chair using your arms (e.g., wheelchair or bedside chair)?: A Lot (mod safety cues needed) Help needed to walk in hospital room?: A Little Help needed climbing 3-5 steps with a railing? : A Little 6 Click Score: 19    End of Session Equipment Utilized During Treatment: Gait belt Activity Tolerance: Patient limited by fatigue;Patient tolerated treatment well (recently worked with mobility  specialist and for stairs) Patient left: in chair;with call bell/phone within reach;with chair alarm set;with family/visitor present (son present) Nurse Communication: Mobility status;Precautions;Other (comment) (poor RW management, high fall risk) PT Visit Diagnosis: Unsteadiness on feet (R26.81);Pain Pain - Right/Left: Left Pain - part of body:  (ribs)     Time: 1937[address]-6967PT Time Calculation (min) (ACUTE ONLY): 12 min  Charges:  $Therapeutic Activity: 8-22 mins                     Petula Rotolo P., PTA Acute Rehabilitation Services Secure Chat Preferred 9a-5:30pm Office: [place]10/24/2023, 3:53 PM

## 2022-06-12 NOTE — TOC Transition Note (Signed)
Transition of Care [facility]) - CM/SW Discharge Note   Patient Details  Name: Dylan [name] MRN: [id number] Date of Birth: [birth year]  Transition of Care [facility] CM/SW Contact:  Ninfa Meeker, RN Phone Number: 06/12/2022, 11:48 AM   Clinical Narrative:    Case Manager was asked to contact patient's son, Marden Noble [phone], to discuss Home Health services. Marden Noble states that his sister and patient's wife got things confused on yesterday when speaking with Case Manager. His dad was going to the gym with him prior to hospitalization, but will not be returning for quite awhile and will need Home Health services. CM received permission to find agency that will accept his [facility]. Referral called to [facility]. CM received return Call from [facility] who accepted referral. CM will update patient's son and will add to AVS. Patient has no DME needs.  Final next level of care: Home w Home Health Services Barriers to Discharge: No Barriers Identified   Patient Goals and CMS Choice Patient states their goals for this hospitalization and ongoing recovery are:: return home with wife and family/home care support CMS Medicare.gov Compare Post Acute Care list provided to:: Patient Choice offered to / list presented to : Patient, Spouse, Adult Children  Discharge Placement                       Discharge Plan and Services   Discharge Planning Services: CM Consult Post Acute Care Choice: Durable Medical Equipment, Home Health          DME Arranged: N/A DME Agency: [facility] Date DME Agency Contacted: 06/12/22 Time DME Agency Contacted: 67 Representative spoke with at DME Agency: [place]: PT, OT [place] Agency: NA        Social Determinants of Health ([place]) Interventions     Readmission Risk Interventions     No data to display

## 2022-06-12 NOTE — Progress Notes (Signed)
Baruch Merl to be D/C'd home per MD order. Discussed with the patient and son, Marden Noble, and all questions fully answered.  Skin clean, dry and intact without evidence of skin break down, no evidence of skin tears noted.  An After Visit Summary was printed and given to the patient.  Patient escorted via [place], and D/C home via private auto.  Melonie [place]  06/12/2022 12:37 PM

## 2022-06-12 NOTE — Discharge Instructions (Signed)
Recommendations for Outpatient Follow-up:  Follow up with PCP in 1-2 weeks Please obtain BMP/CBC in one week Please follow up with your PCP on the following pending results: Unresulted Labs (From admission, onward)

## 2022-06-12 NOTE — Progress Notes (Signed)
Mobility Specialist Progress Note   06/12/22 1045  Mobility  Activity Ambulated with assistance in hallway  Level of Assistance Contact guard assist, steadying assist  Assistive Device Front wheel walker  Distance Ambulated (ft) 156 ft  Activity Response Tolerated well  $Mobility charge 1 Mobility   Pre Mobility: 111/69 BP, 95% SpO2 During Mobility: 116/75 BP, 94% SpO2 Post Mobility: 123/87 BP, 98% SpO2  Received pt in chair w/ no complaints and agreeable to mobility. Denies dizziness or lightheadedness during ambulation but expressing fatigue and SOB post mobility. SpO2 levels reading 98% on portable pulse ox. Encouraged PLB once pt was back in chair, call bell placed in reach and needs met.  Holland Falling Mobility Specialist Acute Rehab Office:  [phone]

## 2022-06-12 NOTE — Discharge Summary (Addendum)
Physician Discharge Summary  Dylan [name] XHB:[id number] DOB: March 22, 1926 DOA: 06/10/2022  PCP: Haywood Pao, MD  Admit date: 06/10/2022 Discharge date: 06/12/2022    Admitted From: Home Disposition: Home  Recommendations for Outpatient Follow-up:  Follow up with PCP in 1-2 weeks Please obtain BMP/CBC in one week Please follow up with your PCP on the following pending results: Unresulted Labs (From admission, onward)    None         Home Health: Yes Equipment/Devices: Rolling walker  Discharge Condition: Stable CODE STATUS: DNR Diet recommendation: Cardiac  Subjective: Seen and examined.  No complaints.  Very minimal pain in the left leg.  He feels comfortable going home.  HPI: Dylan [name] is a 88 y.o. male with medical history significant of PAF status post cardioversion, on Eliquis, HTN, chronic HFpEF, chronic orthostatic hypotension on midodrine, PPM, HCOM, MI CAD, presented to [facility] ED after a fall.   2 nights ago around 10 PM, patient fell at home, and he was trying to turn around at the bedside, he lost balance and left-sided chest hit the corner of the bed.  Denies any head or neck injury, no LOC.  Denies any prodromes of lightheadedness blurry vision shortness of breath or palpitations.  Overnight, patient developed severe pain associated with left-sided rib cage, has been sharp, constant 10/10, worsening with deep breath and cough or upper torso movement.   ED Course: Patient was found hypoxia O2 sat 86% on room air and stabilized on 2 L oxygen, no hypotension no tachycardia.  X-ray showed left ninth and 10th rib fracture.  CT showed similar ninth and 10th rib fracture with left lower lung atelectasis.  Hemoglobin 14.8, creatinine to 1.0.  K4.0.  Patient was given multiple rounds of pain medications and placed on observation.  Trauma surgery informed.  Brief/Interim Summary: Patient was admitted due to following issues, details below.  Fall leading to  ninth and 10th rib fracture: Patient pain is better controlled.  Continue current management.  Discharging on oxycodone for few days.  He is advised to take incentive spirometry home with him and continue using that for next 2 to 3 days.   Acute hypoxic respiratory failure/atelectasis: Likely secondary to rib fractures.  Patient encouraged to use incentive incentive spirometry.  Currently on room air and saturating 80% with activity.     Paroxysmal atrial fibrillation: Controlled.  Continue Eliquis.  Patient is status post cardioversion x2 recently in January and February 2023.  Continue metoprolol.   Chronic congestive heart failure with preserved ejection fraction: Euvolemic.  Continue home dose of Lasix and metoprolol.   History of orthostatic hypotension: Currently doing well.  Continue midodrine.   Tachybradycardia syndrome s/p PPM  Disposition: Patient was seen by PT OT who recommended home health.  Patient was medically stable yesterday however he was not feeling comfortable going home and his family was also requesting to provide some time to make arrangements for him to come home.  For this reason he was kept yesterday in the hospital.  He is feeling good and is medically stable so he is being discharged today.  Discharge plan was discussed with patient and/or family member and they verbalized understanding and agreed with it.  Discharge Diagnoses:  Principal Problem:   Fall at home, initial encounter Active Problems:   Closed rib fracture   Rib fracture   Pressure injury of skin    Discharge Instructions   Allergies as of 06/12/2022       Reactions  Keflex [cephalexin] Hives, Other (See Comments)   Occurred in the 1980's        Medication List     TAKE these medications    acetaminophen 500 MG tablet Commonly known as: TYLENOL Take 1,000 mg by mouth every 8 (eight) hours as needed for mild pain or headache.   Biofreeze Roll-On Colorless 4 % Gel Generic drug:  Menthol (Topical Analgesic) Apply 1 application topically daily as needed (pain).   [employer] STOOL SOFTENER PO Take by mouth. 1 tablet every other day.   cyanocobalamin 1000 MCG tablet Commonly known as: VITAMIN B12 Take 1,000 mcg by mouth daily.   Eliquis 5 MG Tabs tablet Generic drug: apixaban TAKE 1 TABLET BY MOUTH TWICE DAILY . APPOINTMENT REQUIRED FOR FUTURE REFILLS   Fish Oil 1200 MG Caps Take 1,200 mg by mouth daily.   furosemide 40 MG tablet Commonly known as: LASIX Take 1 tablet (40 mg total) by mouth every morning AND 0.5 tablets (20 mg total) every evening.   metoprolol succinate 25 MG 24 hr tablet Commonly known as: TOPROL-XL Take 1 tablet by mouth once daily   midodrine 5 MG tablet Commonly known as: PROAMATINE TAKE 1 TABLET BY MOUTH THREE TIMES DAILY   oxyCODONE 5 MG immediate release tablet Commonly known as: Roxicodone Take 1 tablet (5 mg total) by mouth every 4 (four) hours as needed for severe pain.   potassium chloride SA 20 MEQ tablet Commonly known as: KLOR-CON M Take 1 tablet (20 mEq total) by mouth daily.   rosuvastatin 10 MG tablet Commonly known as: CRESTOR Take 1 tablet by mouth once daily   Vitamin D3 50 MCG (2000 UT) Tabs Take 2,000 Units by mouth daily.        Follow-up Information     Tisovec, Fransico Him, MD Follow up in 1 week(s).   Specialty: Internal Medicine Contact information: [facility] [address] [phone]                Allergies  Allergen Reactions   Keflex [Cephalexin] Hives and Other (See Comments)    Occurred in the 1980's    Consultations: None   Procedures/Studies: CT CHEST ABDOMEN PELVIS W CONTRAST  Result Date: 06/10/2022 CLINICAL DATA:  Fall, pain LEFT anterior ribs. EXAM: CT CHEST, ABDOMEN, AND PELVIS WITH CONTRAST TECHNIQUE: Multidetector CT imaging of the chest, abdomen and pelvis was performed following the standard protocol during bolus administration of intravenous  contrast. RADIATION DOSE REDUCTION: This exam was performed according to the departmental dose-optimization program which includes automated exposure control, adjustment of the mA and/or kV according to patient size and/or use of iterative reconstruction technique. CONTRAST:  25m OMNIPAQUE IOHEXOL 300 MG/ML  SOLN COMPARISON:  Chest CT dated 03/25/2007. FINDINGS: CT CHEST FINDINGS Cardiovascular: No thoracic aortic aneurysm. No pericardial effusion. Three-vessel coronary artery calcifications. Extensive atherosclerosis of the thoracic aorta. Mediastinum/Nodes: No mass or enlarged lymph nodes are seen within the mediastinum, perihilar or axillary regions. Esophagus is unremarkable. Trachea and central bronchi are unremarkable. Lungs/Pleura: Trace LEFT pleural effusion. Mild LEFT basilar atelectasis. Lungs otherwise clear. No pneumothorax. Musculoskeletal: Displaced fractures of the posterior LEFT tenth and eleventh ribs. Old healed rib fractures bilaterally. CT ABDOMEN PELVIS FINDINGS Hepatobiliary: No hepatic injury or perihepatic hematoma. Small layering stones within the otherwise unremarkable gallbladder. No bile duct dilatation is seen. Pancreas: Partially infiltrated with fat but otherwise unremarkable. Spleen: No splenic injury or perisplenic hematoma. Adrenals/Urinary Tract: Adrenal glands appear normal. Kidneys are unremarkable without suspicious mass, stone  or hydronephrosis. Bilateral renal cysts. No follow-up imaging is needed for these benign findings. No adrenal hemorrhage or renal injury identified. Bladder appears normal. Stomach/Bowel: No dilated large or small bowel loops. No evidence of bowel wall inflammation or bowel wall injury. Scattered diverticulosis of the descending and sigmoid colon but no focal inflammatory change to suggest acute diverticulitis. Stomach is unremarkable. Vascular/Lymphatic: No abdominal aortic aneurysm. No evidence of vascular injury within the abdomen or pelvis.  Extensive aortic atherosclerosis. No enlarged lymph nodes are seen in the abdomen or pelvis. Reproductive: Prostate is unremarkable. Other: No free fluid or hemorrhage within the abdomen or pelvis. No free intraperitoneal air. Musculoskeletal: No acute-appearing osseous abnormality within the abdomen or pelvis. Surgical changes of central canal decompression in the lower lumbar spine. IMPRESSION: 1. Acute displaced fractures of the posterior LEFT tenth and eleventh ribs. Additional old healed rib fractures bilaterally. 2. Trace LEFT pleural effusion. Mild LEFT basilar atelectasis. No pneumothorax. 3. No acute findings within the abdomen or pelvis. No evidence of solid organ injury. No free fluid or hemorrhage within the abdomen or pelvis. 4. Colonic diverticulosis without evidence of acute diverticulitis. 5. Cholelithiasis without evidence of acute cholecystitis. 6. Three-vessel coronary artery calcifications. Electronically Signed   By: Franki Cabot M.D.   On: 06/10/2022 11:45   DG Ribs Unilateral W/Chest Left  Result Date: 06/10/2022 CLINICAL DATA:  88 year old male status post fall last night striking left ribcage furniture. Pain. EXAM: LEFT RIBS AND CHEST - 3+ VIEW COMPARISON:  Portable chest 08/09/2019.  Chest CT 03/25/2007. FINDINGS: AP view of the chest at 0839 hours. Stable left chest dual lead cardiac pacemaker. Stable cardiac size and mediastinal contours. Borderline cardiomegaly. Visualized tracheal air column is within normal limits. Stable lung volumes. No pneumothorax or pleural effusion. Mild new patchy left mid lung and lower lung opacity. Right lung appears stable. Negative visible bowel gas. Three oblique views of the left ribs. Bone mineralization is within normal limits for age. Area of the anterior left 10th rib marked. Costochondral junction irregularity there, age indeterminate. No other left lower rib fracture identified. Other visible osseous structures appear grossly intact.  IMPRESSION: 1. Marked left 9th/10th rib area costochondral irregularity suspicious for acute fracture of the costochondral junction(s) in this setting. No other left rib fracture identified. 2. Patchy left mid and lower lung opacity is nonspecific but probably atelectasis. No pleural effusion or pneumothorax identified. Electronically Signed   By: Genevie Ann M.D.   On: 06/10/2022 08:53     Discharge Exam: Vitals:   06/12/22 0422 06/12/22 0714  BP: 138/68 (!) 131/94  Pulse: 86 82  Resp: 18   Temp: 98.2 F (36.8 C) 97.8 F (36.6 C)  SpO2: 92% 92%   Vitals:   06/11/22 0842 06/11/22 2031 06/12/22 0422 06/12/22 0714  BP: (!) 121/58 116/61 138/68 (!) 131/94  Pulse:  86 86 82  Resp: '18 17 18   '$ Temp: 98 F (36.7 C) 98.4 F (36.9 C) 98.2 F (36.8 C) 97.8 F (36.6 C)  TempSrc: Oral Oral  Oral  SpO2: 94%  92% 92%  Weight:      Height:        General: Pt is alert, awake, not in acute distress Cardiovascular: RRR, S1/S2 +, no rubs, no gallops Respiratory: CTA bilaterally, no wheezing, no rhonchi Abdominal: Soft, NT, ND, bowel sounds + Extremities: no edema, no cyanosis    The results of significant diagnostics from this hospitalization (including imaging, microbiology, ancillary and laboratory) are listed below for  reference.     Microbiology: No results found for this or any previous visit (from the past 240 hour(s)).   Labs: BNP (last 3 results) No results for input(s): "BNP" in the last 8760 hours. Basic Metabolic Panel: Recent Labs  Lab 06/10/22 0953  NA 140  K 4.0  CL 100  CO2 29  GLUCOSE 112*  BUN 19  CREATININE 1.06  CALCIUM 9.9   Liver Function Tests: No results for input(s): "AST", "ALT", "ALKPHOS", "BILITOT", "PROT", "ALBUMIN" in the last 168 hours. No results for input(s): "LIPASE", "AMYLASE" in the last 168 hours. No results for input(s): "AMMONIA" in the last 168 hours. CBC: Recent Labs  Lab 06/10/22 0953 06/11/22 0242  WBC 7.3 8.1  HGB 14.8 13.2   HCT 44.3 39.5  MCV 94.3 94.3  PLT 129* 126*   Cardiac Enzymes: No results for input(s): "CKTOTAL", "CKMB", "CKMBINDEX", "TROPONINI" in the last 168 hours. BNP: Invalid input(s): "POCBNP" CBG: No results for input(s): "GLUCAP" in the last 168 hours. D-Dimer No results for input(s): "DDIMER" in the last 72 hours. Hgb A1c No results for input(s): "HGBA1C" in the last 72 hours. Lipid Profile No results for input(s): "CHOL", "HDL", "LDLCALC", "TRIG", "CHOLHDL", "LDLDIRECT" in the last 72 hours. Thyroid function studies No results for input(s): "TSH", "T4TOTAL", "T3FREE", "THYROIDAB" in the last 72 hours.  Invalid input(s): "FREET3" Anemia work up No results for input(s): "VITAMINB12", "FOLATE", "FERRITIN", "TIBC", "IRON", "RETICCTPCT" in the last 72 hours. Urinalysis    Component Value Date/Time   COLORURINE YELLOW 07/28/2019 [place] 07/28/2019 1244   LABSPEC 1.011 07/28/2019 1244   PHURINE 5.0 07/28/2019 1244   GLUCOSEU NEGATIVE 07/28/2019 1244   HGBUR SMALL (A) 07/28/2019 1244   BILIRUBINUR NEGATIVE 07/28/2019 1244   KETONESUR NEGATIVE 07/28/2019 1244   PROTEINUR NEGATIVE 07/28/2019 1244   UROBILINOGEN 1.0 11/22/2009 1423   NITRITE NEGATIVE 07/28/2019 [place] 07/28/2019 1244   Sepsis Labs Recent Labs  Lab 06/10/22 0953 06/11/22 0242  WBC 7.3 8.1   Microbiology No results found for this or any previous visit (from the past 240 hour(s)).   Time coordinating discharge: Over 30 minutes  SIGNED:   Darliss Cheney, MD  [facility] 06/12/2022, 9:55 AM *Please note that this is a verbal dictation therefore any spelling or grammatical errors are due to the "[place] [employer]" system interpretation. If 7PM-7AM, please contact night-coverage [email or web address]

## 2022-06-16 DIAGNOSIS — E1151 Type 2 diabetes mellitus with diabetic peripheral angiopathy without gangrene: Secondary | ICD-10-CM | POA: Diagnosis not present

## 2022-06-16 DIAGNOSIS — I5032 Chronic diastolic (congestive) heart failure: Secondary | ICD-10-CM | POA: Diagnosis not present

## 2022-06-16 DIAGNOSIS — J9601 Acute respiratory failure with hypoxia: Secondary | ICD-10-CM | POA: Diagnosis not present

## 2022-06-16 DIAGNOSIS — Z7901 Long term (current) use of anticoagulants: Secondary | ICD-10-CM | POA: Diagnosis not present

## 2022-06-16 DIAGNOSIS — E785 Hyperlipidemia, unspecified: Secondary | ICD-10-CM | POA: Diagnosis not present

## 2022-06-16 DIAGNOSIS — J9811 Atelectasis: Secondary | ICD-10-CM | POA: Diagnosis not present

## 2022-06-16 DIAGNOSIS — K5909 Other constipation: Secondary | ICD-10-CM | POA: Diagnosis not present

## 2022-06-16 DIAGNOSIS — I2510 Atherosclerotic heart disease of native coronary artery without angina pectoris: Secondary | ICD-10-CM | POA: Diagnosis not present

## 2022-06-16 DIAGNOSIS — I951 Orthostatic hypotension: Secondary | ICD-10-CM | POA: Diagnosis not present

## 2022-06-16 DIAGNOSIS — I252 Old myocardial infarction: Secondary | ICD-10-CM | POA: Diagnosis not present

## 2022-06-16 DIAGNOSIS — Z556 Problems related to health literacy: Secondary | ICD-10-CM | POA: Diagnosis not present

## 2022-06-16 DIAGNOSIS — Z950 Presence of cardiac pacemaker: Secondary | ICD-10-CM | POA: Diagnosis not present

## 2022-06-16 DIAGNOSIS — I110 Hypertensive heart disease with heart failure: Secondary | ICD-10-CM | POA: Diagnosis not present

## 2022-06-16 DIAGNOSIS — I421 Obstructive hypertrophic cardiomyopathy: Secondary | ICD-10-CM | POA: Diagnosis not present

## 2022-06-16 DIAGNOSIS — S2242XD Multiple fractures of ribs, left side, subsequent encounter for fracture with routine healing: Secondary | ICD-10-CM | POA: Diagnosis not present

## 2022-06-16 DIAGNOSIS — I480 Paroxysmal atrial fibrillation: Secondary | ICD-10-CM | POA: Diagnosis not present

## 2022-06-16 DIAGNOSIS — Z9181 History of falling: Secondary | ICD-10-CM | POA: Diagnosis not present

## 2022-06-20 ENCOUNTER — Ambulatory Visit: Payer: Medicare HMO | Admitting: Podiatry

## 2022-06-20 DIAGNOSIS — I5032 Chronic diastolic (congestive) heart failure: Secondary | ICD-10-CM | POA: Diagnosis not present

## 2022-06-20 DIAGNOSIS — W06XXXS Fall from bed, sequela: Secondary | ICD-10-CM | POA: Diagnosis not present

## 2022-06-20 DIAGNOSIS — S2232XD Fracture of one rib, left side, subsequent encounter for fracture with routine healing: Secondary | ICD-10-CM | POA: Diagnosis not present

## 2022-06-20 DIAGNOSIS — G6289 Other specified polyneuropathies: Secondary | ICD-10-CM | POA: Diagnosis not present

## 2022-06-20 DIAGNOSIS — M6281 Muscle weakness (generalized): Secondary | ICD-10-CM | POA: Diagnosis not present

## 2022-06-20 DIAGNOSIS — Z950 Presence of cardiac pacemaker: Secondary | ICD-10-CM | POA: Diagnosis not present

## 2022-06-20 DIAGNOSIS — Z7901 Long term (current) use of anticoagulants: Secondary | ICD-10-CM | POA: Diagnosis not present

## 2022-06-20 DIAGNOSIS — D6869 Other thrombophilia: Secondary | ICD-10-CM | POA: Diagnosis not present

## 2022-06-20 DIAGNOSIS — E1151 Type 2 diabetes mellitus with diabetic peripheral angiopathy without gangrene: Secondary | ICD-10-CM | POA: Diagnosis not present

## 2022-06-20 DIAGNOSIS — I110 Hypertensive heart disease with heart failure: Secondary | ICD-10-CM | POA: Diagnosis not present

## 2022-06-20 DIAGNOSIS — I480 Paroxysmal atrial fibrillation: Secondary | ICD-10-CM | POA: Diagnosis not present

## 2022-06-22 ENCOUNTER — Encounter: Payer: Self-pay | Admitting: Podiatry

## 2022-06-22 ENCOUNTER — Ambulatory Visit (INDEPENDENT_AMBULATORY_CARE_PROVIDER_SITE_OTHER): Payer: Medicare HMO | Admitting: Podiatry

## 2022-06-22 DIAGNOSIS — E1142 Type 2 diabetes mellitus with diabetic polyneuropathy: Secondary | ICD-10-CM

## 2022-06-22 DIAGNOSIS — E1159 Type 2 diabetes mellitus with other circulatory complications: Secondary | ICD-10-CM

## 2022-06-22 DIAGNOSIS — S90112A Contusion of left great toe without damage to nail, initial encounter: Secondary | ICD-10-CM | POA: Diagnosis not present

## 2022-06-22 DIAGNOSIS — M79675 Pain in left toe(s): Secondary | ICD-10-CM | POA: Diagnosis not present

## 2022-06-22 DIAGNOSIS — S90129A Contusion of unspecified lesser toe(s) without damage to nail, initial encounter: Secondary | ICD-10-CM | POA: Insufficient documentation

## 2022-06-22 DIAGNOSIS — B351 Tinea unguium: Secondary | ICD-10-CM

## 2022-06-22 DIAGNOSIS — D689 Coagulation defect, unspecified: Secondary | ICD-10-CM | POA: Diagnosis not present

## 2022-06-22 DIAGNOSIS — I739 Peripheral vascular disease, unspecified: Secondary | ICD-10-CM | POA: Diagnosis not present

## 2022-06-22 DIAGNOSIS — M79674 Pain in right toe(s): Secondary | ICD-10-CM

## 2022-06-22 NOTE — Progress Notes (Signed)
This patient returns to my office for at risk foot care.  This patient requires this care by a professional since this patient will be at risk due to having diabetic neuropathy,and coagulation defect.  Patient is taking eliquis.    Patient presents to the office with his wife.  This patient is unable to cut nails himself since the patient cannot reach his nails.These nails are painful walking and wearing shoes.  He says he fell earlier this week and has two broken ribs.  He also said there was bleeding from his big toe left foot.  He says the toe looked bad but has improved since the injury.  This patient presents for at risk foot care today.  General Appearance  Alert, conversant and in no acute stress.  Vascular  Dorsalis pedis and posterior tibial  pulses are palpable  Left foot . Absent dorsalis pedis and posterior tibial pulses right foot.  Capillary return is within normal limits  bilaterally. Temperature is within normal limits  Bilaterally.  Venous stasis  B/L.  Neurologic  Senn-Weinstein monofilament wire test diminished  bilaterally. Muscle power within normal limits bilaterally.  Nails Thick disfigured discolored nails with subungual debris  from hallux to fifth toes bilaterally. No evidence of bacterial infection or drainage bilaterally.  Orthopedic  No limitations of motion  feet .  No crepitus or effusions noted.  Hallux malleus IPJ  B/L.  Hammer toes 2  B/L.  Midfoot DJD  B/L.  HAV  B/L.  Red and increased temperature left hallux.  No swelling or fluctuance noted.    Skin  normotropic skin with no porokeratosis noted bilaterally.  No signs of infections or ulcers noted.     Onychomycosis  Pain in right toes  Pain in left toes.  Contusion left hallux.  Consent was obtained for treatment procedures.   Mechanical debridement of nails 1-5  bilaterally performed with a nail nipper.  Filed with dremel without incident. Visual inspection was performed and this patient was told to keep an eye  on his left toe and if the toe worsens he is to call the office.     Return office visit    3 months                  Told patient to return for periodic foot care and evaluation due to potential at risk complications.   Gardiner Barefoot DPM

## 2022-06-28 ENCOUNTER — Ambulatory Visit: Payer: Medicare HMO | Attending: Internal Medicine

## 2022-06-28 DIAGNOSIS — I495 Sick sinus syndrome: Secondary | ICD-10-CM | POA: Diagnosis not present

## 2022-06-28 LAB — CUP PACEART REMOTE DEVICE CHECK
Battery Remaining Longevity: 97 mo
Battery Voltage: 2.98 V
Brady Statistic RA Percent Paced: 0.39 %
Brady Statistic RV Percent Paced: 48.19 %
Date Time Interrogation Session: 20231109000727
Implantable Lead Connection Status: 753985
Implantable Lead Connection Status: 753985
Implantable Lead Implant Date: 20190216
Implantable Lead Implant Date: 20190216
Implantable Lead Location: 753859
Implantable Lead Location: 753860
Implantable Lead Model: 5076
Implantable Lead Model: 5076
Implantable Pulse Generator Implant Date: 20190216
Lead Channel Impedance Value: 285 Ohm
Lead Channel Impedance Value: 323 Ohm
Lead Channel Impedance Value: 380 Ohm
Lead Channel Impedance Value: 513 Ohm
Lead Channel Pacing Threshold Amplitude: 0.5 V
Lead Channel Pacing Threshold Amplitude: 1 V
Lead Channel Pacing Threshold Pulse Width: 0.4 ms
Lead Channel Pacing Threshold Pulse Width: 0.4 ms
Lead Channel Sensing Intrinsic Amplitude: 0.875 mV
Lead Channel Sensing Intrinsic Amplitude: 0.875 mV
Lead Channel Sensing Intrinsic Amplitude: 3.125 mV
Lead Channel Sensing Intrinsic Amplitude: 3.125 mV
Lead Channel Setting Pacing Amplitude: 1.5 V
Lead Channel Setting Pacing Amplitude: 2.5 V
Lead Channel Setting Pacing Pulse Width: 0.4 ms
Lead Channel Setting Sensing Sensitivity: 1.2 mV
Zone Setting Status: 755011
Zone Setting Status: 755011

## 2022-07-04 DIAGNOSIS — I480 Paroxysmal atrial fibrillation: Secondary | ICD-10-CM | POA: Diagnosis not present

## 2022-07-04 DIAGNOSIS — I872 Venous insufficiency (chronic) (peripheral): Secondary | ICD-10-CM | POA: Diagnosis not present

## 2022-07-04 DIAGNOSIS — M6281 Muscle weakness (generalized): Secondary | ICD-10-CM | POA: Diagnosis not present

## 2022-07-04 DIAGNOSIS — I5032 Chronic diastolic (congestive) heart failure: Secondary | ICD-10-CM | POA: Diagnosis not present

## 2022-07-04 DIAGNOSIS — R634 Abnormal weight loss: Secondary | ICD-10-CM | POA: Diagnosis not present

## 2022-07-04 DIAGNOSIS — I110 Hypertensive heart disease with heart failure: Secondary | ICD-10-CM | POA: Diagnosis not present

## 2022-07-06 NOTE — Progress Notes (Signed)
Remote pacemaker transmission.   

## 2022-07-10 ENCOUNTER — Encounter (HOSPITAL_COMMUNITY): Payer: Self-pay | Admitting: Cardiology

## 2022-07-10 ENCOUNTER — Ambulatory Visit (HOSPITAL_COMMUNITY)
Admission: RE | Admit: 2022-07-10 | Discharge: 2022-07-10 | Disposition: A | Discharge status: Home / Self Care | Payer: Medicare HMO | Source: Ambulatory Visit | Attending: Cardiology | Admitting: Cardiology

## 2022-07-10 VITALS — BP 100/60 | HR 73 | Wt 240.0 lb

## 2022-07-10 DIAGNOSIS — I5032 Chronic diastolic (congestive) heart failure: Secondary | ICD-10-CM | POA: Diagnosis not present

## 2022-07-10 DIAGNOSIS — I4891 Unspecified atrial fibrillation: Secondary | ICD-10-CM

## 2022-07-10 DIAGNOSIS — E785 Hyperlipidemia, unspecified: Secondary | ICD-10-CM | POA: Diagnosis not present

## 2022-07-10 DIAGNOSIS — J1282 Pneumonia due to coronavirus disease 2019: Secondary | ICD-10-CM | POA: Diagnosis not present

## 2022-07-10 DIAGNOSIS — I4819 Other persistent atrial fibrillation: Secondary | ICD-10-CM | POA: Insufficient documentation

## 2022-07-10 DIAGNOSIS — E1122 Type 2 diabetes mellitus with diabetic chronic kidney disease: Secondary | ICD-10-CM | POA: Diagnosis not present

## 2022-07-10 DIAGNOSIS — Z7901 Long term (current) use of anticoagulants: Secondary | ICD-10-CM | POA: Diagnosis not present

## 2022-07-10 DIAGNOSIS — I130 Hypertensive heart and chronic kidney disease with heart failure and stage 1 through stage 4 chronic kidney disease, or unspecified chronic kidney disease: Secondary | ICD-10-CM | POA: Diagnosis not present

## 2022-07-10 DIAGNOSIS — I951 Orthostatic hypotension: Secondary | ICD-10-CM | POA: Insufficient documentation

## 2022-07-10 DIAGNOSIS — Z79899 Other long term (current) drug therapy: Secondary | ICD-10-CM | POA: Insufficient documentation

## 2022-07-10 DIAGNOSIS — I5033 Acute on chronic diastolic (congestive) heart failure: Secondary | ICD-10-CM

## 2022-07-10 MED ORDER — FUROSEMIDE 40 MG PO TABS: ORAL_TABLET | ORAL | 3 refills | Status: DC

## 2022-07-10 NOTE — Progress Notes (Signed)
ID:  Dylan [name], DOB 1926-02-08, MRN [id number]   Provider location: [facility] Type of Visit: Established patient  PCP:  Haywood Pao, MD  Cardiologist:  Loralie Champagne, MD   History of Present Illness: Dylan [name] is a 88 y.o. with history of DM and HTN as well as atrial fibrillation and CHF.    Patient was doing well until 11/18 when he developed exertional dyspnea.  He went to the ER 07/14/17 and was found to be in atrial fibrillation with volume overload.  He was admitted overnight, diuresed and started on Eliquis, and discharged.  He remained in atrial fibrillation and short of breath.   He was re-hospitalized with dyspnea in 12/18.  TEE-guided DCCV to NSR was done and he was diuresed. TEE showed EF 50%.    He was noted to be back in atrial fibrillation and was started on amiodarone.  TEE-guided DCCV in 1/19 was done, with conversion back to NSR.   He was admitted in 2/19 with syncope and found to have tachy-brady syndrome with junctional bradycardia (symptomatic). Medtronic dual chamber PPM was placed.   He was readmitted in 2/19 with pneumonia.  During this admission, he was noted to be back in atrial fibrillation but converted back to NSR.   In 12/20, he developed COVID-19 pneumonitis and was hospitalized at [facility].  He developed orthostatic hypotension after COVID-19.  He was started on midodrine.   PYP scan in 9/21 was not suggestive of TTR cardiac amyloidosis.   Echo in 9/22 showed EF 55-60%, mild LVH (reviewed echo).   Patient was noted to be in atrial fibrillation in 1/23 after stopping amiodarone.  Amiodarone was restarted and he was cardioverted to NSR in 1/23.  However, he only stayed in NSR for about 2 days based on device interrogation and has been back in persistent atrial fibrillation. He had DCCV again to NSR in 2/23 but was back in AF a couple of days later.   He returns for followup of atrial fibrillation and  CHF.  He fell a few weeks ago and fractured 2 ribs.  Fall was mechanical when he got up at night to use the bathroom.  No lightheadedness/syncope.  Due to pain, he did not eat or drink much and PCP cut back on Lasix to 40 mg daily.  He is doing better now, weight is back up to his baseline. He walks with his walker, dyspnea only with long distances.  He exercises daily.  No chest pain.   Medtronic device interrogation: 41% v-pacing  ECG (personally reviewed): atrial fibrillation rate 78, nonspecific T wave flattening, occasional V-pacing  Labs (12/20): hgb 12.9, TSH normal, BNP 72, LFTs normal, K 3.9, creatinine 0.88 Labs (2/21): TSH normal, hgb 13, LDL 52, K 3.8, creatinine 1.03, LFTs normal Labs (6/21): hgb 13.2, TSH normal, K 4, creatinine 1.19, LFTs normal Labs (9/21): Myeloma panel and urine immunofixation negative, LFTs normal, K 4.1, creatinine 1.37 Labs (8/22): hgb 13.2, TSH normal, K 4.5, creatinine 1.22, LFTs normal.  Labs (10/22): K 3.9, creatinine 1.25 Labs (1/23): K 3.6, creatinine 1.06, hgb 12.6 Labs (2/23): K 3.8, creatinine 1.25, hgb 13.1 Labs (3/23): K 4.2, creatinine 1.33 Labs (10/23): K 4, creatinine 1.06, hgb 13.2  PMH: 1. Chronic diastolic CHF: Echo (58/09) with EF 50-55%, mild-moderate LVH, mild AI, mild MR, normal RV size and systolic function, PASP 45 mmHg.  - TEE (12/18): EF 50%, normal RV size and systolic function, mild MR.  -  TEE (1/19): EF 55-60%, mild LVH, mild MR, normal RV, aortic sclerosis without significant stenosis.  - PYP scan (9/21): grade 1, H/CL 1.03 => unlikely TTR cardiac amyloidosis.  - Echo (9/22): EF 55-60%, mild LVH (mild review) 2. Atrial fibrillation: Persistent, initially diagnosed in 11/18.  - TEE-guided DCCV in 12/18.  - TEE-guided DCCV in 1/19 on amiodarone.  - DCCV 1/23 on amiodarone - DCCV 2/23  3. Chest pain: LHC remotely without obstructive disease.  1/16 Cardiolite with no ischemia.  4. Type II diabetes 5. HTN 6. CKD: Stage 3.   7. Tachy-brady syndrome: Medtronic dual chamber.   8. Orthostatic hypotension.  9. COVID-19 pneumonitis 10. ABIs (6/19): Normal 11. Spinal stenosis  Social History   Socioeconomic History   Marital status: Married    Spouse name: Not on file   Number of children: Not on file   Years of education: Not on file   Highest education level: Not on file  Occupational History   Not on file  Tobacco Use   Smoking status: Former    Packs/day: 1.00    Types: Cigarettes    Quit date: 01/07/1992    Years since quitting: 30.5   Smokeless tobacco: Never  Substance and Sexual Activity   Alcohol use: Not Currently    Alcohol/week: 2.0 standard drinks of alcohol    Types: 2 Glasses of wine per week   Drug use: No   Sexual activity: Yes  Other Topics Concern   Not on file  Social History Narrative   Not on file   Social Determinants of Health   Financial Resource Strain: Not on file  Food Insecurity: Not on file  Transportation Needs: Not on file  Physical Activity: Not on file  Stress: Not on file  Social Connections: Not on file  Intimate Partner Violence: Not on file   Family History  Problem Relation Age of Onset   Cancer - Other Mother    CVA Father    Heart attack Brother    Heart attack Maternal Grandmother    ROS: All systems reviewed and negative except as per HPI.  Current Outpatient Medications  Medication Sig Dispense Refill   acetaminophen (TYLENOL) 500 MG tablet Take 1,000 mg by mouth every 8 (eight) hours as needed for mild pain or headache.     Cholecalciferol (VITAMIN D3) 50 MCG (2000 UT) TABS Take 2,000 Units by mouth daily.     docusate sodium (COLACE) 100 MG capsule Take 100 mg by mouth daily.     ELIQUIS 5 MG TABS tablet TAKE 1 TABLET BY MOUTH TWICE DAILY . APPOINTMENT REQUIRED FOR FUTURE REFILLS 60 tablet 11   Menthol, Topical Analgesic, (BIOFREEZE ROLL-ON COLORLESS) 4 % GEL Apply 1 application topically daily as needed (pain).     metoprolol succinate  (TOPROL-XL) 25 MG 24 hr tablet Take 1 tablet by mouth once daily 90 tablet 3   midodrine (PROAMATINE) 5 MG tablet TAKE 1 TABLET BY MOUTH THREE TIMES DAILY 180 tablet 3   Omega-3 Fatty Acids (FISH OIL) 1200 MG CAPS Take 1,200 mg by mouth daily.      potassium chloride SA (KLOR-CON M) 20 MEQ tablet Take 1 tablet (20 mEq total) by mouth daily. 90 tablet 3   rosuvastatin (CRESTOR) 10 MG tablet Take 1 tablet by mouth once daily 90 tablet 3   vitamin B-12 (CYANOCOBALAMIN) 1000 MCG tablet Take 1,000 mcg by mouth daily.     furosemide (LASIX) 40 MG tablet Take 1 tablet (40 mg  total) by mouth every morning AND 0.5 tablets (20 mg total) every evening. 135 tablet 3   No current facility-administered medications for this encounter.   BP 100/60   Pulse 73   Wt 108.9 kg (240 lb)   SpO2 96%   BMI 32.55 kg/m  General: NAD Neck: No JVD, no thyromegaly or thyroid nodule.  Lungs: Clear to auscultation bilaterally with normal respiratory effort. CV: Nondisplaced PMI.  Heart irregular S1/S2, no S3/S4, no murmur.  Trace ankle edema.  No carotid bruit.  Normal pedal pulses.  Abdomen: Soft, nontender, no hepatosplenomegaly, no distention.  Skin: Intact without lesions or rashes.  Neurologic: Alert and oriented x 3.  Psych: Normal affect. Extremities: No clubbing or cyanosis.  HEENT: Normal.   Assessment/Plan: 1. Atrial fibrillation: DCCV in 12/18.  Recurrence of atrial fibrillation with initiation of amiodarone then TEE-guided DCCV in 1/19.  Amiodarone stopped fall of 2022, back in AF in 12/22.  Amiodarone restarted and he had DCCV to NSR in 1/23 but atrial fibrillation recurred < 2 days despite amiodarone.  DCCV again in 2/23 with recurrence of AF again.  He remains in atrial fibrillation today. Rate is controlled, he does not feel palpitations.  - He has failed amiodarone at this point and we will not re-attempt DCCV. He will continue Toprol XL 25 mg daily for rate control.     - Continue apixaban.  2. CKD:  BMET today.   3. Chronic diastolic CHF: EF 24-58% from 1/19 TEE.  Prior workup negative for cardiac amyloidosis.  Echo in 9/22 with mild LVH, EF 55-60% (my review).  Weight starting to rise again after Lasix cut back, not significantly volume overloaded on exam.  - Increase Lasix back to 40 qam/20 qpm with BMET in 10 days.  - Continue KDur 20 mEq daily.  4. Orthostatic hypotension: This has been present since his COVID-19 infection and may be related.  This seems to be improved.  - He remains on low dose midodrine.  5. Hyperlipidemia: Check lipids today.   Followup in 4 months    Signed, Loralie Champagne, MD  07/10/2022  [place] [address] [facility] [place] [address] [phone] (office) [phone] (fax)

## 2022-07-10 NOTE — Patient Instructions (Signed)
INCREASE Lasix to 40 mg in the morning and '20mg'$  in the evening.  Blood work in 10 days  Your physician recommends that you schedule a follow-up appointment in: 4 months (March 2024)  **please call the office in January to arrange your follow up appointment **  If you have any questions or concerns before your next appointment please send [place] a message through [place] or call our office at [phone].    TO LEAVE A MESSAGE FOR THE NURSE SELECT OPTION 2, PLEASE LEAVE A MESSAGE INCLUDING: YOUR NAME DATE OF BIRTH CALL BACK NUMBER REASON FOR CALL**this is important as we prioritize the call backs  YOU WILL RECEIVE A CALL BACK THE SAME DAY AS LONG AS YOU CALL BEFORE 4:00 PM  At the [facility], you and your health needs are our priority. As part of our continuing mission to provide you with exceptional heart care, we have created designated Provider Care Teams. These Care Teams include your primary Cardiologist (physician) and Advanced Practice Providers (APPs- Physician Assistants and Nurse Practitioners) who all work together to provide you with the care you need, when you need it.   You may see any of the following providers on your designated Care Team at your next follow up: Dr Glori Bickers Dr Loralie Champagne Dr. Roxana Hires, NP Lyda Jester, [place] [facility] [place], [place] Forestine Na, NP Audry Riles, PharmD   Please be sure to bring in all your medications bottles to every appointment.

## 2022-07-10 NOTE — Progress Notes (Signed)
Medication Samples have been provided to the patient.  Drug name: Eliquis       Strength: '5mg'$          Qty: 4 boxes  LOT: [id number]  Exp.Date: 05/25  Dosing instructions: take 1 tablet Twice daily   The patient has been instructed regarding the correct time, dose, and frequency of taking this medication, including desired effects and most common side effects.   Jerl Mina 8:57 AM 07/10/2022

## 2022-07-18 ENCOUNTER — Other Ambulatory Visit (HOSPITAL_COMMUNITY): Payer: Self-pay | Admitting: Cardiology

## 2022-07-20 ENCOUNTER — Ambulatory Visit (HOSPITAL_COMMUNITY)
Admission: RE | Admit: 2022-07-20 | Discharge: 2022-07-20 | Disposition: A | Discharge status: Home / Self Care | Payer: Medicare HMO | Source: Ambulatory Visit | Attending: Cardiology | Admitting: Cardiology

## 2022-07-20 DIAGNOSIS — E785 Hyperlipidemia, unspecified: Secondary | ICD-10-CM | POA: Diagnosis not present

## 2022-07-20 DIAGNOSIS — I5032 Chronic diastolic (congestive) heart failure: Secondary | ICD-10-CM | POA: Diagnosis not present

## 2022-07-20 LAB — BASIC METABOLIC PANEL
Anion gap: 8 (ref 5–15)
BUN: 20 mg/dL (ref 8–23)
CO2: 26 mmol/L (ref 22–32)
Calcium: 9.2 mg/dL (ref 8.9–10.3)
Chloride: 103 mmol/L (ref 98–111)
Creatinine, Ser: 1.07 mg/dL (ref 0.61–1.24)
GFR, Estimated: >60 mL/min (ref >60)
Glucose, Bld: 85 mg/dL (ref 70–99)
Potassium: 3.8 mmol/L (ref 3.5–5.1)
Sodium: 137 mmol/L (ref 135–145)

## 2022-07-20 LAB — LIPID PANEL
Cholesterol: 104 mg/dL (ref 0–200)
HDL: 37 mg/dL — ABNORMAL LOW (ref >40)
LDL Cholesterol: 47 mg/dL (ref 0–99)
Total CHOL/HDL Ratio: 2.8 RATIO
Triglycerides: 101 mg/dL (ref <150)
VLDL: 20 mg/dL (ref 0–40)

## 2022-08-01 ENCOUNTER — Encounter: Payer: Medicare HMO | Admitting: Internal Medicine

## 2022-08-23 DIAGNOSIS — D6869 Other thrombophilia: Secondary | ICD-10-CM | POA: Diagnosis not present

## 2022-08-23 DIAGNOSIS — M6281 Muscle weakness (generalized): Secondary | ICD-10-CM | POA: Diagnosis not present

## 2022-08-23 DIAGNOSIS — Z7901 Long term (current) use of anticoagulants: Secondary | ICD-10-CM | POA: Diagnosis not present

## 2022-08-23 DIAGNOSIS — I480 Paroxysmal atrial fibrillation: Secondary | ICD-10-CM | POA: Diagnosis not present

## 2022-08-23 DIAGNOSIS — I110 Hypertensive heart disease with heart failure: Secondary | ICD-10-CM | POA: Diagnosis not present

## 2022-08-23 DIAGNOSIS — E7800 Pure hypercholesterolemia, unspecified: Secondary | ICD-10-CM | POA: Diagnosis not present

## 2022-08-23 DIAGNOSIS — M160 Bilateral primary osteoarthritis of hip: Secondary | ICD-10-CM | POA: Diagnosis not present

## 2022-08-23 DIAGNOSIS — I5032 Chronic diastolic (congestive) heart failure: Secondary | ICD-10-CM | POA: Diagnosis not present

## 2022-08-23 DIAGNOSIS — E1151 Type 2 diabetes mellitus with diabetic peripheral angiopathy without gangrene: Secondary | ICD-10-CM | POA: Diagnosis not present

## 2022-08-23 DIAGNOSIS — E669 Obesity, unspecified: Secondary | ICD-10-CM | POA: Diagnosis not present

## 2022-08-23 DIAGNOSIS — Z950 Presence of cardiac pacemaker: Secondary | ICD-10-CM | POA: Diagnosis not present

## 2022-08-23 DIAGNOSIS — G6289 Other specified polyneuropathies: Secondary | ICD-10-CM | POA: Diagnosis not present

## 2022-08-27 ENCOUNTER — Other Ambulatory Visit (HOSPITAL_COMMUNITY): Payer: Self-pay | Admitting: Cardiology

## 2022-09-10 ENCOUNTER — Encounter: Payer: Medicare HMO | Admitting: Internal Medicine

## 2022-09-13 ENCOUNTER — Encounter: Payer: Self-pay | Admitting: Internal Medicine

## 2022-09-13 ENCOUNTER — Ambulatory Visit: Payer: Medicare HMO | Attending: Internal Medicine | Admitting: Internal Medicine

## 2022-09-13 VITALS — BP 104/66 | HR 72 | Ht 72.0 in | Wt 239.4 lb

## 2022-09-13 DIAGNOSIS — Z950 Presence of cardiac pacemaker: Secondary | ICD-10-CM | POA: Diagnosis not present

## 2022-09-13 DIAGNOSIS — I951 Orthostatic hypotension: Secondary | ICD-10-CM | POA: Diagnosis not present

## 2022-09-13 DIAGNOSIS — I495 Sick sinus syndrome: Secondary | ICD-10-CM | POA: Diagnosis not present

## 2022-09-13 DIAGNOSIS — I440 Atrioventricular block, first degree: Secondary | ICD-10-CM | POA: Diagnosis not present

## 2022-09-13 DIAGNOSIS — I480 Paroxysmal atrial fibrillation: Secondary | ICD-10-CM

## 2022-09-13 NOTE — Progress Notes (Signed)
Patient Care Team: Tisovec, Fransico Him, MD as PCP - General (Internal Medicine) Larey Dresser, MD as PCP - Cardiology (Cardiology)   HPI  Dylan [name] is a 89 y.o. male Seen in follow-up for pacemaker implantation Medtronic 2019 for bradycardia and presyncope.  With significant orthostatic hypotension; intercurrently has developed atrial fibrillation and had been treated with amiodarone stopped 7/23 secondary to a failure to hold rhythm  Now permanent afib  Edema Ambulates with assistance     DATE TEST EF   1/19 TEE  55-60 %   9/22 Echo  .    Date Cr K Hgb  3/19 1.29 3.7 13.5  2/21 1.03 3.8 13.0  12/23 1.07 3.8 13.2     Past Medical History:  Diagnosis Date   COVID-19    Diabetes mellitus without complication ([place])    Dyspnea    History of hiatal hernia    Hyperglycemia    Hyperlipidemia    Hypertension    Lung nodule    Myocardial infarction [facility])    Presence of permanent cardiac pacemaker     Past Surgical History:  Procedure Laterality Date   APPENDECTOMY  1956   BACK SURGERY  2008   CARDIAC CATHETERIZATION  1995   negative results   CARDIOVERSION N/A 08/05/2017   Procedure: CARDIOVERSION;  Surgeon: Larey Dresser, MD;  Location: [place];  Service: Cardiovascular;  Laterality: N/A;   CARDIOVERSION N/A 09/12/2017   Procedure: CARDIOVERSION;  Surgeon: Larey Dresser, MD;  Location: [facility] ENDOSCOPY;  Service: Cardiovascular;  Laterality: N/A;   CARDIOVERSION N/A 09/15/2021   Procedure: CARDIOVERSION;  Surgeon: Larey Dresser, MD;  Location: [facility] ENDOSCOPY;  Service: Cardiovascular;  Laterality: N/A;   CARDIOVERSION N/A 10/12/2021   Procedure: CARDIOVERSION;  Surgeon: Larey Dresser, MD;  Location: [place];  Service: Cardiovascular;  Laterality: N/A;   COLON SURGERY  07/09/12   [place] HEMOSTASIS  01/08/2012   Procedure: HOT HEMOSTASIS (ARGON PLASMA COAGULATION/BICAP);  Surgeon: Arta Silence, MD;  Location: [facility]  ENDOSCOPY;  Service: Endoscopy;  Laterality: N/A;   LAPAROSCOPIC ILEOCECECTOMY  07/09/2012   Procedure: LAPAROSCOPIC ILEOCECECTOMY;  Surgeon: Edward Jolly, MD;  Location: [facility] ORS;  Service: General;  Laterality: N/A;  Laparoscopic Ileocecectomy   PACEMAKER IMPLANT N/A 10/05/2017   Procedure: PACEMAKER IMPLANT;  Surgeon: Deboraha Sprang, MD;  Location: [place] CV LAB;  Service: Cardiovascular;  Laterality: N/A;   REPLACEMENT TOTAL KNEE  2010   TEE WITHOUT CARDIOVERSION N/A 08/05/2017   Procedure: TRANSESOPHAGEAL ECHOCARDIOGRAM (TEE);  Surgeon: Larey Dresser, MD;  Location: [facility] ENDOSCOPY;  Service: Cardiovascular;  Laterality: N/A;   TEE WITHOUT CARDIOVERSION N/A 09/12/2017   Procedure: TRANSESOPHAGEAL ECHOCARDIOGRAM (TEE);  Surgeon: Larey Dresser, MD;  Location: [facility] ENDOSCOPY;  Service: Cardiovascular;  Laterality: N/A;    Current Meds  Medication Sig   acetaminophen (TYLENOL) 500 MG tablet Take 1,000 mg by mouth every 8 (eight) hours as needed for mild pain or headache.   Cholecalciferol (VITAMIN D3) 50 MCG (2000 UT) TABS Take 2,000 Units by mouth daily.   docusate sodium (COLACE) 100 MG capsule Take 100 mg by mouth daily.   ELIQUIS 5 MG TABS tablet TAKE 1 TABLET BY MOUTH TWICE DAILY . APPOINTMENT REQUIRED FOR FUTURE REFILLS   furosemide (LASIX) 40 MG tablet Take 1 tablet (40 mg total) by mouth every morning AND 0.5 tablets (20 mg total) every evening.   Menthol, Topical Analgesic, (BIOFREEZE ROLL-ON  COLORLESS) 4 % GEL Apply 1 application topically daily as needed (pain).   metoprolol succinate (TOPROL-XL) 25 MG 24 hr tablet Take 1 tablet by mouth once daily   midodrine (PROAMATINE) 5 MG tablet TAKE 1 TABLET BY MOUTH THREE TIMES DAILY   Omega-3 Fatty Acids (FISH OIL) 1200 MG CAPS Take 1,200 mg by mouth daily.    potassium chloride SA (KLOR-CON M) 20 MEQ tablet Take 1 tablet (20 mEq total) by mouth daily.   rosuvastatin (CRESTOR) 10 MG tablet Take 1 tablet by mouth once daily    vitamin B-12 (CYANOCOBALAMIN) 1000 MCG tablet Take 1,000 mcg by mouth daily.    Allergies  Allergen Reactions   Keflex [Cephalexin] Hives and Other (See Comments)    Occurred in the 1980's    Review of Systems negative except from HPI and PMH  Physical Exam BP 104/66   Pulse 72   Ht 6' (1.829 m)   Wt 239 lb 6.4 oz (108.6 kg)   SpO2 93%   BMI 32.47 kg/m  Well developed and well nourished in no acute distress  HENT normal Neck supple with JVP-flat Clear Device pocket well healed; without hematoma or erythema.  There is no tethering  Irregularly Irregular rate and rhythm with  controlled  ventricular response Abd-soft with active BS No Clubbing cyanosis tr  edema Skin-warm and dry A & Oriented  Grossly normal sensory and motor function  ECG atrial fib V pacing remittent  Device function is  normal.  Programming changes DDD-MVP >>VVIR  Decreased RV amplitude from 2.5>2.0V  See Paceart for details    Assessment and  Plan Sinus node dysfunction  Orthostatic intolerance  Pacemaker-Medtronic   First-degree AV block  Atrial fibrillation permanent   Hypertension     Device reprogrammed (As above)  Volume overloaded  will increase his furosemide from 40>>60 mg qd  BP well controlled and without LH so will continue amlodipine at current doses  No bleeding  continue Apixaban  dosed approp for renal function and weight             Current medicines are reviewed at length with the patient today .  The patient does not  have concerns regarding medicines.

## 2022-09-13 NOTE — Patient Instructions (Addendum)
Medication Instructions:  Your physician recommends that you continue on your current medications as directed. Please refer to the Current Medication list given to you today.  *If you need a refill on your cardiac medications before your next appointment, please call your pharmacy*   Lab Work: None ordered.  If you have labs (blood work) drawn today and your tests are completely normal, you will receive your results only by: [place] (if you have MyChart) OR A paper copy in the mail If you have any lab test that is abnormal or we need to change your treatment, we will call you to review the results.   Testing/Procedures: None ordered.    Follow-Up: At [facility], you and your health needs are our priority.  As part of our continuing mission to provide you with exceptional heart care, we have created designated Provider Care Teams.  These Care Teams include your primary Cardiologist (physician) and Advanced Practice Providers (APPs -  Physician Assistants and Nurse Practitioners) who all work together to provide you with the care you need, when you need it.  We recommend signing up for the patient portal called "MyChart".  Sign up information is provided on this After Visit Summary.  MyChart is used to connect with patients for Virtual Visits (Telemedicine).  Patients are able to view lab/test results, encounter notes, upcoming appointments, etc.  Non-urgent messages can be sent to your provider as well.   To learn more about what you can do with MyChart, go to [email or web address].    Your next appointment:   12 months with Dr Caryl Comes  2 weeks worth of Eliquis '5mg'$  - 1 tablet by mouth daily - samples provided per Dr Caryl Comes

## 2022-09-19 LAB — CUP PACEART INCLINIC DEVICE CHECK
Date Time Interrogation Session: 20240125192647
Implantable Lead Connection Status: 753985
Implantable Lead Connection Status: 753985
Implantable Lead Implant Date: 20190216
Implantable Lead Implant Date: 20190216
Implantable Lead Location: 753859
Implantable Lead Location: 753860
Implantable Lead Model: 5076
Implantable Lead Model: 5076
Implantable Pulse Generator Implant Date: 20190216

## 2022-09-20 ENCOUNTER — Other Ambulatory Visit (HOSPITAL_COMMUNITY): Payer: Self-pay | Admitting: Cardiology

## 2022-09-25 DIAGNOSIS — M79641 Pain in right hand: Secondary | ICD-10-CM | POA: Diagnosis not present

## 2022-09-25 DIAGNOSIS — M1811 Unilateral primary osteoarthritis of first carpometacarpal joint, right hand: Secondary | ICD-10-CM | POA: Diagnosis not present

## 2022-09-25 DIAGNOSIS — G5601 Carpal tunnel syndrome, right upper limb: Secondary | ICD-10-CM | POA: Diagnosis not present

## 2022-09-27 ENCOUNTER — Ambulatory Visit (INDEPENDENT_AMBULATORY_CARE_PROVIDER_SITE_OTHER): Payer: Medicare HMO

## 2022-09-27 DIAGNOSIS — I495 Sick sinus syndrome: Secondary | ICD-10-CM | POA: Diagnosis not present

## 2022-09-27 LAB — CUP PACEART REMOTE DEVICE CHECK
Battery Remaining Longevity: 97 mo
Battery Voltage: 2.98 V
Brady Statistic RA Percent Paced: 0 %
Brady Statistic RV Percent Paced: 39.39 %
Date Time Interrogation Session: 20240208123249
Implantable Lead Connection Status: 753985
Implantable Lead Connection Status: 753985
Implantable Lead Implant Date: 20190216
Implantable Lead Implant Date: 20190216
Implantable Lead Location: 753859
Implantable Lead Location: 753860
Implantable Lead Model: 5076
Implantable Lead Model: 5076
Implantable Pulse Generator Implant Date: 20190216
Lead Channel Impedance Value: 304 Ohm
Lead Channel Impedance Value: 304 Ohm
Lead Channel Impedance Value: 380 Ohm
Lead Channel Impedance Value: 494 Ohm
Lead Channel Pacing Threshold Amplitude: 0.5 V
Lead Channel Pacing Threshold Amplitude: 1 V
Lead Channel Pacing Threshold Pulse Width: 0.4 ms
Lead Channel Pacing Threshold Pulse Width: 0.4 ms
Lead Channel Sensing Intrinsic Amplitude: 1.375 mV
Lead Channel Sensing Intrinsic Amplitude: 1.375 mV
Lead Channel Sensing Intrinsic Amplitude: 3.375 mV
Lead Channel Sensing Intrinsic Amplitude: 3.375 mV
Lead Channel Setting Pacing Amplitude: 2 V
Lead Channel Setting Pacing Pulse Width: 0.4 ms
Lead Channel Setting Sensing Sensitivity: 1.2 mV
Zone Setting Status: 755011
Zone Setting Status: 755011

## 2022-09-28 ENCOUNTER — Ambulatory Visit: Payer: Medicare HMO | Admitting: Podiatry

## 2022-10-04 ENCOUNTER — Encounter: Payer: Medicare HMO | Admitting: Internal Medicine

## 2022-10-10 ENCOUNTER — Ambulatory Visit (INDEPENDENT_AMBULATORY_CARE_PROVIDER_SITE_OTHER): Payer: Medicare HMO | Admitting: Podiatry

## 2022-10-10 ENCOUNTER — Encounter: Payer: Self-pay | Admitting: Podiatry

## 2022-10-10 DIAGNOSIS — E1159 Type 2 diabetes mellitus with other circulatory complications: Secondary | ICD-10-CM | POA: Diagnosis not present

## 2022-10-10 DIAGNOSIS — M79674 Pain in right toe(s): Secondary | ICD-10-CM

## 2022-10-10 DIAGNOSIS — B351 Tinea unguium: Secondary | ICD-10-CM | POA: Diagnosis not present

## 2022-10-10 DIAGNOSIS — M79675 Pain in left toe(s): Secondary | ICD-10-CM

## 2022-10-10 NOTE — Progress Notes (Signed)
This patient returns to my office for at risk foot care.  This patient requires this care by a professional since this patient will be at risk due to having diabetic neuropathy,and coagulation defect.  Patient is taking eliquis.    Patient presents to the office with his wife.  This patient is unable to cut nails himself since the patient cannot reach his nails.These nails are painful walking and wearing  shoes. This patient presents for at risk foot care today.  General Appearance  Alert, conversant and in no acute stress.  Vascular  Dorsalis pedis and posterior tibial  pulses are palpable  Left foot . Absent dorsalis pedis and posterior tibial pulses right foot.  Capillary return is within normal limits  bilaterally. Temperature is within normal limits  Bilaterally.  Venous stasis  B/L.  Neurologic  Senn-Weinstein monofilament wire test diminished  bilaterally. Muscle power within normal limits bilaterally.  Nails Thick disfigured discolored nails with subungual debris  from hallux to fifth toes bilaterally. No evidence of bacterial infection or drainage bilaterally.  Orthopedic  No limitations of motion  feet .  No crepitus or effusions noted.  Hallux malleus IPJ  B/L.  Hammer toes 2  B/L.  Midfoot DJD  B/L.  HAV  B/L.     Skin  normotropic skin with no porokeratosis noted bilaterally.  No signs of infections or ulcers noted.     Onychomycosis  Pain in right toes  Pain in left toes.  Contusion left hallux.  Consent was obtained for treatment procedures.   Mechanical debridement of nails 1-5  bilaterally performed with a nail nipper.     Return office visit    3 months                  Told patient to return for periodic foot care and evaluation due to potential at risk complications.   Gardiner Barefoot DPM

## 2022-10-17 DIAGNOSIS — G5601 Carpal tunnel syndrome, right upper limb: Secondary | ICD-10-CM | POA: Diagnosis not present

## 2022-10-17 DIAGNOSIS — M1811 Unilateral primary osteoarthritis of first carpometacarpal joint, right hand: Secondary | ICD-10-CM | POA: Diagnosis not present

## 2022-10-20 ENCOUNTER — Other Ambulatory Visit (HOSPITAL_COMMUNITY): Payer: Self-pay | Admitting: Cardiology

## 2022-10-22 NOTE — Progress Notes (Signed)
Remote pacemaker transmission.   

## 2022-10-25 DIAGNOSIS — G5601 Carpal tunnel syndrome, right upper limb: Secondary | ICD-10-CM | POA: Diagnosis not present

## 2022-11-08 ENCOUNTER — Encounter (HOSPITAL_COMMUNITY): Payer: Self-pay | Admitting: Cardiology

## 2022-11-08 ENCOUNTER — Ambulatory Visit (HOSPITAL_COMMUNITY)
Admission: RE | Admit: 2022-11-08 | Discharge: 2022-11-08 | Disposition: A | Discharge status: Home / Self Care | Payer: Medicare HMO | Source: Ambulatory Visit | Attending: Cardiology | Admitting: Cardiology

## 2022-11-08 VITALS — BP 102/60 | HR 83 | Wt 231.4 lb

## 2022-11-08 DIAGNOSIS — I4819 Other persistent atrial fibrillation: Secondary | ICD-10-CM | POA: Diagnosis not present

## 2022-11-08 DIAGNOSIS — I130 Hypertensive heart and chronic kidney disease with heart failure and stage 1 through stage 4 chronic kidney disease, or unspecified chronic kidney disease: Secondary | ICD-10-CM | POA: Insufficient documentation

## 2022-11-08 DIAGNOSIS — E1122 Type 2 diabetes mellitus with diabetic chronic kidney disease: Secondary | ICD-10-CM | POA: Diagnosis not present

## 2022-11-08 DIAGNOSIS — I951 Orthostatic hypotension: Secondary | ICD-10-CM | POA: Insufficient documentation

## 2022-11-08 DIAGNOSIS — Z7901 Long term (current) use of anticoagulants: Secondary | ICD-10-CM | POA: Insufficient documentation

## 2022-11-08 DIAGNOSIS — E785 Hyperlipidemia, unspecified: Secondary | ICD-10-CM | POA: Insufficient documentation

## 2022-11-08 DIAGNOSIS — Z8616 Personal history of COVID-19: Secondary | ICD-10-CM | POA: Insufficient documentation

## 2022-11-08 DIAGNOSIS — R42 Dizziness and giddiness: Secondary | ICD-10-CM | POA: Diagnosis not present

## 2022-11-08 DIAGNOSIS — G5600 Carpal tunnel syndrome, unspecified upper limb: Secondary | ICD-10-CM | POA: Diagnosis not present

## 2022-11-08 DIAGNOSIS — I5032 Chronic diastolic (congestive) heart failure: Secondary | ICD-10-CM | POA: Diagnosis not present

## 2022-11-08 DIAGNOSIS — N183 Chronic kidney disease, stage 3 unspecified: Secondary | ICD-10-CM | POA: Diagnosis not present

## 2022-11-08 DIAGNOSIS — Z79899 Other long term (current) drug therapy: Secondary | ICD-10-CM | POA: Insufficient documentation

## 2022-11-08 DIAGNOSIS — I4891 Unspecified atrial fibrillation: Secondary | ICD-10-CM

## 2022-11-08 LAB — BASIC METABOLIC PANEL
Anion gap: 9 (ref 5–15)
BUN: 15 mg/dL (ref 8–23)
CO2: 25 mmol/L (ref 22–32)
Calcium: 9.2 mg/dL (ref 8.9–10.3)
Chloride: 103 mmol/L (ref 98–111)
Creatinine, Ser: 1.18 mg/dL (ref 0.61–1.24)
GFR, Estimated: 57 mL/min — ABNORMAL LOW (ref >60)
Glucose, Bld: 87 mg/dL (ref 70–99)
Potassium: 3.6 mmol/L (ref 3.5–5.1)
Sodium: 137 mmol/L (ref 135–145)

## 2022-11-08 LAB — CBC
HCT: 43.5 % (ref 39.0–52.0)
Hemoglobin: 14.1 g/dL (ref 13.0–17.0)
MCH: 31.1 pg (ref 26.0–34.0)
MCHC: 32.4 g/dL (ref 30.0–36.0)
MCV: 95.8 fL (ref 80.0–100.0)
Platelets: 153 10*3/uL (ref 150–400)
RBC: 4.54 MIL/uL (ref 4.22–5.81)
RDW: 13.5 % (ref 11.5–15.5)
WBC: 8 10*3/uL (ref 4.0–10.5)
nRBC: 0 % (ref 0.0–0.2)

## 2022-11-08 LAB — BRAIN NATRIURETIC PEPTIDE: B Natriuretic Peptide: 278.9 pg/mL — ABNORMAL HIGH (ref 0.0–100.0)

## 2022-11-08 NOTE — Progress Notes (Addendum)
ID:  Dylan [name], DOB 1926-03-07, MRN [id number]   Provider location: [place] [facility] Type of Visit: Established patient  PCP:  Haywood Pao, MD  Cardiologist:  Loralie Champagne, MD   History of Present Illness: Dylan [name] is a 89 y.o. with history of DM and HTN as well as atrial fibrillation and CHF.    Patient was doing well until 11/18 when he developed exertional dyspnea.  He went to the ER 07/14/17 and was found to be in atrial fibrillation with volume overload.  He was admitted overnight, diuresed and started on Eliquis, and discharged.  He remained in atrial fibrillation and short of breath.   He was re-hospitalized with dyspnea in 12/18.  TEE-guided DCCV to NSR was done and he was diuresed. TEE showed EF 50%.    He was noted to be back in atrial fibrillation and was started on amiodarone.  TEE-guided DCCV in 1/19 was done, with conversion back to NSR.   He was admitted in 2/19 with syncope and found to have tachy-brady syndrome with junctional bradycardia (symptomatic). Medtronic dual chamber PPM was placed.   He was readmitted in 2/19 with pneumonia.  During this admission, he was noted to be back in atrial fibrillation but converted back to NSR.   In 12/20, he developed COVID-19 pneumonitis and was hospitalized at [facility].  He developed orthostatic hypotension after COVID-19.  He was started on midodrine.   PYP scan in 9/21 was not suggestive of TTR cardiac amyloidosis.   Echo in 9/22 showed EF 55-60%, mild LVH (reviewed echo).   Patient was noted to be in atrial fibrillation in 1/23 after stopping amiodarone.  Amiodarone was restarted and he was cardioverted to NSR in 1/23.  However, he only stayed in NSR for about 2 days based on device interrogation and has been back in persistent atrial fibrillation. He had DCCV again to NSR in 2/23 but was back in AF a couple of days later.   He returns for followup of atrial fibrillation and  CHF.  He has been stable recently.  Weight down 9 lbs after increasing Lasix.  He remains in chronic atrial fibrillation but rate is controlled.  He still goes to the [facility] 2 days/week and works with a Clinical research associate.  He will ride an exercise bike for 20-30 minutes.  He walks with a walker for balance.  Occasional lightheadedness if he stands up too fast. No recent falls. He has been having significant pain from carpal tunnel syndrome and surgery has been recommended.   ECG (personally reviewed): atrial fibrillation, nonspecific T wave flattening, occasional V-pacing  Labs (12/20): hgb 12.9, TSH normal, BNP 72, LFTs normal, K 3.9, creatinine 0.88 Labs (2/21): TSH normal, hgb 13, LDL 52, K 3.8, creatinine 1.03, LFTs normal Labs (6/21): hgb 13.2, TSH normal, K 4, creatinine 1.19, LFTs normal Labs (9/21): Myeloma panel and urine immunofixation negative, LFTs normal, K 4.1, creatinine 1.37 Labs (8/22): hgb 13.2, TSH normal, K 4.5, creatinine 1.22, LFTs normal.  Labs (10/22): K 3.9, creatinine 1.25 Labs (1/23): K 3.6, creatinine 1.06, hgb 12.6 Labs (2/23): K 3.8, creatinine 1.25, hgb 13.1 Labs (3/23): K 4.2, creatinine 1.33 Labs (10/23): K 4, creatinine 1.06, hgb 13.2 Labs (12/23): K 3.8, creatinine 1.07, LDL 47, TGs 101  PMH: 1. Chronic diastolic CHF: Echo ([id number]) with EF 50-55%, mild-moderate LVH, mild AI, mild MR, normal RV size and systolic function, PASP 45 mmHg.  - TEE (12/18): EF 50%, normal RV  size and systolic function, mild MR.  - TEE (1/19): EF 55-60%, mild LVH, mild MR, normal RV, aortic sclerosis without significant stenosis.  - PYP scan (9/21): grade 1, H/CL 1.03 => unlikely TTR cardiac amyloidosis.  - Echo (9/22): EF 55-60%, mild LVH (mild review) 2. Atrial fibrillation: Persistent, initially diagnosed in 11/18.  - TEE-guided DCCV in 12/18.  - TEE-guided DCCV in 1/19 on amiodarone.  - DCCV 1/23 on amiodarone - DCCV 2/23  3. Chest pain: LHC remotely without obstructive disease.  1/16  Cardiolite with no ischemia.  4. Type II diabetes 5. HTN 6. CKD: Stage 3.  7. Tachy-brady syndrome: Medtronic dual chamber.   8. Orthostatic hypotension.  9. COVID-19 pneumonitis 10. ABIs (6/19): Normal 11. Spinal stenosis 12. Carpal tunnel syndrome  Social History   Socioeconomic History   Marital status: Married    Spouse name: Not on file   Number of children: Not on file   Years of education: Not on file   Highest education level: Not on file  Occupational History   Not on file  Tobacco Use   Smoking status: Former    Packs/day: 1    Types: Cigarettes    Quit date: 01/07/1992    Years since quitting: 30.8   Smokeless tobacco: Never  Substance and Sexual Activity   Alcohol use: Not Currently    Alcohol/week: 2.0 standard drinks of alcohol    Types: 2 Glasses of wine per week   Drug use: No   Sexual activity: Yes  Other Topics Concern   Not on file  Social History Narrative   Not on file   Social Determinants of Health   Financial Resource Strain: Not on file  Food Insecurity: Not on file  Transportation Needs: Not on file  Physical Activity: Not on file  Stress: Not on file  Social Connections: Not on file  Intimate Partner Violence: Not on file   Family History  Problem Relation Age of Onset   Cancer - Other Mother    CVA Father    Heart attack Brother    Heart attack Maternal Grandmother    ROS: All systems reviewed and negative except as per HPI.  Current Outpatient Medications  Medication Sig Dispense Refill   acetaminophen (TYLENOL) 500 MG tablet Take 1,000 mg by mouth every 8 (eight) hours as needed for mild pain or headache.     Cholecalciferol (VITAMIN D3) 50 MCG (2000 UT) TABS Take 2,000 Units by mouth daily.     docusate sodium (COLACE) 100 MG capsule Take 100 mg by mouth daily.     ELIQUIS 5 MG TABS tablet TAKE 1 TABLET BY MOUTH TWICE DAILY . APPOINTMENT REQUIRED FOR FUTURE REFILLS 60 tablet 0   furosemide (LASIX) 40 MG tablet Take 1 tablet  (40 mg total) by mouth every morning AND 0.5 tablets (20 mg total) every evening. 135 tablet 3   Menthol, Topical Analgesic, (BIOFREEZE ROLL-ON COLORLESS) 4 % GEL Apply 1 application topically daily as needed (pain).     metoprolol succinate (TOPROL-XL) 25 MG 24 hr tablet Take 1 tablet by mouth once daily 90 tablet 3   midodrine (PROAMATINE) 5 MG tablet TAKE 1 TABLET BY MOUTH THREE TIMES DAILY 180 tablet 3   Omega-3 Fatty Acids (FISH OIL) 1200 MG CAPS Take 1,200 mg by mouth daily.      potassium chloride SA (KLOR-CON M) 20 MEQ tablet Take 1 tablet by mouth once daily 90 tablet 0   rosuvastatin (CRESTOR) 10 MG tablet  Take 1 tablet by mouth once daily 90 tablet 3   vitamin B-12 (CYANOCOBALAMIN) 1000 MCG tablet Take 1,000 mcg by mouth daily.     No current facility-administered medications for this encounter.   BP 102/60   Pulse 83   Wt 105 kg (231 lb 6.4 oz)   SpO2 97%   BMI 31.38 kg/m  General: NAD Neck: No JVD, no thyromegaly or thyroid nodule.  Lungs: Clear to auscultation bilaterally with normal respiratory effort. CV: Nondisplaced PMI.  Heart irregular S1/S2, no S3/S4, no murmur.  No peripheral edema.  No carotid bruit.  Normal pedal pulses.  Abdomen: Soft, nontender, no hepatosplenomegaly, no distention.  Skin: Intact without lesions or rashes.  Neurologic: Alert and oriented x 3.  Psych: Normal affect. Extremities: No clubbing or cyanosis.  HEENT: Normal.   Assessment/Plan: 1. Atrial fibrillation: DCCV in 12/18.  Recurrence of atrial fibrillation with initiation of amiodarone then TEE-guided DCCV in 1/19.  Amiodarone stopped fall of 2022, back in AF in 12/22.  Amiodarone restarted and he had DCCV to NSR in 1/23 but atrial fibrillation recurred < 2 days despite amiodarone.  DCCV again in 2/23 with recurrence of AF again.  He remains in atrial fibrillation today. Rate is controlled, he does not feel palpitations.  - He has failed amiodarone at this point and we will not re-attempt  DCCV.  - He will continue Toprol XL 25 mg daily for rate control.     - Continue apixaban. CBC today.  2. CKD: BMET today.   3. Chronic diastolic CHF: EF 0000000 from 1/19 TEE.  Prior workup negative for cardiac amyloidosis.  Echo in 9/22 with mild LVH, EF 55-60% (my review).  Weight is down, he is not volume overloaded on exam.  - Continue Lasix 40 qam/20 qpm, BMET today.  - Continue KDur 20 mEq daily.  4. Orthostatic hypotension: This has been present since his COVID-19 infection and may be related.  This seems to be improved.  - He remains on low dose midodrine.  5. Hyperlipidemia: Good lipids in 12/23.  6. Carpal tunnel syndrome: He may need wrist surgery.  I think this would be ok to do under local anesthesia.   Followup in 4 months    Signed, Loralie Champagne, MD  11/08/2022  [place] [address] [facility] [place] [address] [phone] (office) [phone] (fax)

## 2022-11-08 NOTE — Addendum Note (Signed)
Encounter addended by: Larey Dresser, MD on: 11/08/2022 9:54 PM  Actions taken: Clinical Note Signed

## 2022-11-08 NOTE — Patient Instructions (Signed)
There has been no changes to your medications.  Labs done today, your results will be available in MyChart, we will contact you for abnormal readings.  Your physician recommends that you schedule a follow-up appointment in: 4 months ( July) ** please call the office in May to arrange your follow up appointment. **  If you have any questions or concerns before your next appointment please send us a message through mychart or call our office at [phone].    TO LEAVE A MESSAGE FOR THE NURSE SELECT OPTION 2, PLEASE LEAVE A MESSAGE INCLUDING: YOUR NAME DATE OF BIRTH CALL BACK NUMBER REASON FOR CALL**this is important as we prioritize the call backs  YOU WILL RECEIVE A CALL BACK THE SAME DAY AS LONG AS YOU CALL BEFORE 4:00 PM  At the [facility], you and your health needs are our priority. As part of our continuing mission to provide you with exceptional heart care, we have created designated Provider Care Teams. These Care Teams include your primary Cardiologist (physician) and Advanced Practice Providers (APPs- Physician Assistants and Nurse Practitioners) who all work together to provide you with the care you need, when you need it.   You may see any of the following providers on your designated Care Team at your next follow up: Dr Daniel Bensimhon Dr Dalton McLean Dr. Aditya Sabharwal Amy Clegg, NP Brittainy Simmons, PA Jessica Milford,NP Lindsay Finch, PA Alma Diaz, NP Lauren Kemp, PharmD   Please be sure to bring in all your medications bottles to every appointment.    Thank you for choosing [facility]    

## 2022-11-13 DIAGNOSIS — H04123 Dry eye syndrome of bilateral lacrimal glands: Secondary | ICD-10-CM | POA: Diagnosis not present

## 2022-11-13 DIAGNOSIS — H43813 Vitreous degeneration, bilateral: Secondary | ICD-10-CM | POA: Diagnosis not present

## 2022-11-13 DIAGNOSIS — E119 Type 2 diabetes mellitus without complications: Secondary | ICD-10-CM | POA: Diagnosis not present

## 2022-11-13 DIAGNOSIS — H1045 Other chronic allergic conjunctivitis: Secondary | ICD-10-CM | POA: Diagnosis not present

## 2022-11-13 DIAGNOSIS — Z961 Presence of intraocular lens: Secondary | ICD-10-CM | POA: Diagnosis not present

## 2022-11-16 ENCOUNTER — Other Ambulatory Visit (HOSPITAL_COMMUNITY): Payer: Self-pay | Admitting: Cardiology

## 2022-12-13 ENCOUNTER — Other Ambulatory Visit (HOSPITAL_COMMUNITY): Payer: Self-pay | Admitting: Cardiology

## 2022-12-27 ENCOUNTER — Ambulatory Visit (INDEPENDENT_AMBULATORY_CARE_PROVIDER_SITE_OTHER): Payer: Medicare HMO

## 2022-12-27 DIAGNOSIS — I442 Atrioventricular block, complete: Secondary | ICD-10-CM | POA: Diagnosis not present

## 2022-12-27 LAB — CUP PACEART REMOTE DEVICE CHECK
Battery Remaining Longevity: 91 mo
Battery Voltage: 2.97 V
Brady Statistic RA Percent Paced: 0 %
Brady Statistic RV Percent Paced: 38.56 %
Date Time Interrogation Session: 20240508211540
Implantable Lead Connection Status: 753985
Implantable Lead Connection Status: 753985
Implantable Lead Implant Date: 20190216
Implantable Lead Implant Date: 20190216
Implantable Lead Location: 753859
Implantable Lead Location: 753860
Implantable Lead Model: 5076
Implantable Lead Model: 5076
Implantable Pulse Generator Implant Date: 20190216
Lead Channel Impedance Value: 304 Ohm
Lead Channel Impedance Value: 304 Ohm
Lead Channel Impedance Value: 380 Ohm
Lead Channel Impedance Value: 456 Ohm
Lead Channel Pacing Threshold Amplitude: 0.5 V
Lead Channel Pacing Threshold Amplitude: 0.875 V
Lead Channel Pacing Threshold Pulse Width: 0.4 ms
Lead Channel Pacing Threshold Pulse Width: 0.4 ms
Lead Channel Sensing Intrinsic Amplitude: 0.625 mV
Lead Channel Sensing Intrinsic Amplitude: 0.625 mV
Lead Channel Sensing Intrinsic Amplitude: 3.875 mV
Lead Channel Sensing Intrinsic Amplitude: 3.875 mV
Lead Channel Setting Pacing Amplitude: 2 V
Lead Channel Setting Pacing Pulse Width: 0.4 ms
Lead Channel Setting Sensing Sensitivity: 1.2 mV
Zone Setting Status: 755011
Zone Setting Status: 755011

## 2023-01-09 ENCOUNTER — Ambulatory Visit (INDEPENDENT_AMBULATORY_CARE_PROVIDER_SITE_OTHER): Payer: Medicare HMO | Admitting: Podiatry

## 2023-01-09 ENCOUNTER — Encounter: Payer: Self-pay | Admitting: Podiatry

## 2023-01-09 DIAGNOSIS — M79675 Pain in left toe(s): Secondary | ICD-10-CM

## 2023-01-09 DIAGNOSIS — B351 Tinea unguium: Secondary | ICD-10-CM

## 2023-01-09 DIAGNOSIS — M79674 Pain in right toe(s): Secondary | ICD-10-CM | POA: Diagnosis not present

## 2023-01-09 DIAGNOSIS — E1159 Type 2 diabetes mellitus with other circulatory complications: Secondary | ICD-10-CM

## 2023-01-09 NOTE — Progress Notes (Signed)
This patient returns to my office for at risk foot care.  This patient requires this care by a professional since this patient will be at risk due to having diabetic neuropathy,and coagulation defect.  Patient is taking eliquis.    Patient presents to the office with his wife.  This patient is unable to cut nails himself since the patient cannot reach his nails.These nails are painful walking and wearing  shoes. This patient presents for at risk foot care today.  General Appearance  Alert, conversant and in no acute stress.  Vascular  Dorsalis pedis and posterior tibial  pulses are palpable  Left foot . Absent dorsalis pedis and posterior tibial pulses right foot.  Capillary return is within normal limits  bilaterally. Temperature is within normal limits  Bilaterally.  Venous stasis  B/L.  Neurologic  Senn-Weinstein monofilament wire test diminished  bilaterally. Muscle power within normal limits bilaterally.  Nails Thick disfigured discolored nails with subungual debris  from hallux to fifth toes bilaterally. No evidence of bacterial infection or drainage bilaterally.  Orthopedic  No limitations of motion  feet .  No crepitus or effusions noted.  Hallux malleus IPJ  B/L.  Hammer toes 2  B/L.  Midfoot DJD  B/L.  HAV  B/L.     Skin  normotropic skin with no porokeratosis noted bilaterally.  No signs of infections or ulcers noted.     Onychomycosis  Pain in right toes  Pain in left toes.  Contusion left hallux.  Consent was obtained for treatment procedures.   Mechanical debridement of nails 1-5  bilaterally performed with a nail nipper.     Return office visit    4   months                  Told patient to return for periodic foot care and evaluation due to potential at risk complications.   Helane Gunther DPM

## 2023-01-11 ENCOUNTER — Other Ambulatory Visit (HOSPITAL_COMMUNITY): Payer: Self-pay | Admitting: Cardiology

## 2023-01-16 NOTE — Progress Notes (Signed)
Remote pacemaker transmission.   

## 2023-01-25 DIAGNOSIS — I110 Hypertensive heart disease with heart failure: Secondary | ICD-10-CM | POA: Diagnosis not present

## 2023-01-25 DIAGNOSIS — E7800 Pure hypercholesterolemia, unspecified: Secondary | ICD-10-CM | POA: Diagnosis not present

## 2023-01-25 DIAGNOSIS — Z125 Encounter for screening for malignant neoplasm of prostate: Secondary | ICD-10-CM | POA: Diagnosis not present

## 2023-01-25 DIAGNOSIS — E1151 Type 2 diabetes mellitus with diabetic peripheral angiopathy without gangrene: Secondary | ICD-10-CM | POA: Diagnosis not present

## 2023-01-25 DIAGNOSIS — R7989 Other specified abnormal findings of blood chemistry: Secondary | ICD-10-CM | POA: Diagnosis not present

## 2023-02-01 DIAGNOSIS — I10 Essential (primary) hypertension: Secondary | ICD-10-CM | POA: Diagnosis not present

## 2023-02-01 DIAGNOSIS — E669 Obesity, unspecified: Secondary | ICD-10-CM | POA: Diagnosis not present

## 2023-02-01 DIAGNOSIS — Z0000 Encounter for general adult medical examination without abnormal findings: Secondary | ICD-10-CM | POA: Diagnosis not present

## 2023-02-01 DIAGNOSIS — E1151 Type 2 diabetes mellitus with diabetic peripheral angiopathy without gangrene: Secondary | ICD-10-CM | POA: Diagnosis not present

## 2023-02-01 DIAGNOSIS — Z7901 Long term (current) use of anticoagulants: Secondary | ICD-10-CM | POA: Diagnosis not present

## 2023-02-01 DIAGNOSIS — Z1339 Encounter for screening examination for other mental health and behavioral disorders: Secondary | ICD-10-CM | POA: Diagnosis not present

## 2023-02-01 DIAGNOSIS — I5032 Chronic diastolic (congestive) heart failure: Secondary | ICD-10-CM | POA: Diagnosis not present

## 2023-02-01 DIAGNOSIS — Z1331 Encounter for screening for depression: Secondary | ICD-10-CM | POA: Diagnosis not present

## 2023-02-01 DIAGNOSIS — Z950 Presence of cardiac pacemaker: Secondary | ICD-10-CM | POA: Diagnosis not present

## 2023-02-01 DIAGNOSIS — I480 Paroxysmal atrial fibrillation: Secondary | ICD-10-CM | POA: Diagnosis not present

## 2023-02-01 DIAGNOSIS — M6281 Muscle weakness (generalized): Secondary | ICD-10-CM | POA: Diagnosis not present

## 2023-02-01 DIAGNOSIS — R82998 Other abnormal findings in urine: Secondary | ICD-10-CM | POA: Diagnosis not present

## 2023-02-01 DIAGNOSIS — I110 Hypertensive heart disease with heart failure: Secondary | ICD-10-CM | POA: Diagnosis not present

## 2023-02-01 DIAGNOSIS — D6869 Other thrombophilia: Secondary | ICD-10-CM | POA: Diagnosis not present

## 2023-02-04 ENCOUNTER — Other Ambulatory Visit (HOSPITAL_COMMUNITY): Payer: Self-pay | Admitting: Cardiology

## 2023-02-08 ENCOUNTER — Other Ambulatory Visit (HOSPITAL_COMMUNITY): Payer: Self-pay | Admitting: Cardiology

## 2023-02-11 ENCOUNTER — Other Ambulatory Visit (HOSPITAL_COMMUNITY): Payer: Self-pay | Admitting: Cardiology

## 2023-03-12 ENCOUNTER — Ambulatory Visit (HOSPITAL_COMMUNITY)
Admission: RE | Admit: 2023-03-12 | Discharge: 2023-03-12 | Disposition: A | Discharge status: Home / Self Care | Payer: Medicare HMO | Source: Ambulatory Visit | Attending: Cardiology | Admitting: Cardiology

## 2023-03-12 ENCOUNTER — Encounter (HOSPITAL_COMMUNITY): Payer: Self-pay | Admitting: Cardiology

## 2023-03-12 VITALS — BP 102/64 | HR 106 | Wt 234.8 lb

## 2023-03-12 DIAGNOSIS — I951 Orthostatic hypotension: Secondary | ICD-10-CM | POA: Diagnosis not present

## 2023-03-12 DIAGNOSIS — Z79899 Other long term (current) drug therapy: Secondary | ICD-10-CM | POA: Diagnosis not present

## 2023-03-12 DIAGNOSIS — I130 Hypertensive heart and chronic kidney disease with heart failure and stage 1 through stage 4 chronic kidney disease, or unspecified chronic kidney disease: Secondary | ICD-10-CM | POA: Insufficient documentation

## 2023-03-12 DIAGNOSIS — E785 Hyperlipidemia, unspecified: Secondary | ICD-10-CM | POA: Insufficient documentation

## 2023-03-12 DIAGNOSIS — N183 Chronic kidney disease, stage 3 unspecified: Secondary | ICD-10-CM | POA: Insufficient documentation

## 2023-03-12 DIAGNOSIS — Z8616 Personal history of COVID-19: Secondary | ICD-10-CM | POA: Insufficient documentation

## 2023-03-12 DIAGNOSIS — I4819 Other persistent atrial fibrillation: Secondary | ICD-10-CM | POA: Diagnosis not present

## 2023-03-12 DIAGNOSIS — E1122 Type 2 diabetes mellitus with diabetic chronic kidney disease: Secondary | ICD-10-CM | POA: Diagnosis not present

## 2023-03-12 DIAGNOSIS — I5032 Chronic diastolic (congestive) heart failure: Secondary | ICD-10-CM | POA: Diagnosis not present

## 2023-03-12 DIAGNOSIS — Z7901 Long term (current) use of anticoagulants: Secondary | ICD-10-CM | POA: Insufficient documentation

## 2023-03-12 LAB — BASIC METABOLIC PANEL
Anion gap: 11 (ref 5–15)
BUN: 12 mg/dL (ref 8–23)
CO2: 25 mmol/L (ref 22–32)
Calcium: 9 mg/dL (ref 8.9–10.3)
Chloride: 104 mmol/L (ref 98–111)
Creatinine, Ser: 1.26 mg/dL — ABNORMAL HIGH (ref 0.61–1.24)
GFR, Estimated: 53 mL/min — ABNORMAL LOW (ref >60)
Glucose, Bld: 64 mg/dL — ABNORMAL LOW (ref 70–99)
Potassium: 3.5 mmol/L (ref 3.5–5.1)
Sodium: 140 mmol/L (ref 135–145)

## 2023-03-12 LAB — CBC
HCT: 40.4 % (ref 39.0–52.0)
Hemoglobin: 13.3 g/dL (ref 13.0–17.0)
MCH: 31.6 pg (ref 26.0–34.0)
MCHC: 32.9 g/dL (ref 30.0–36.0)
MCV: 96 fL (ref 80.0–100.0)
Platelets: 136 10*3/uL — ABNORMAL LOW (ref 150–400)
RBC: 4.21 MIL/uL — ABNORMAL LOW (ref 4.22–5.81)
RDW: 12.7 % (ref 11.5–15.5)
WBC: 6.2 10*3/uL (ref 4.0–10.5)
nRBC: 0 % (ref 0.0–0.2)

## 2023-03-12 LAB — BRAIN NATRIURETIC PEPTIDE: B Natriuretic Peptide: 431.3 pg/mL — ABNORMAL HIGH (ref 0.0–100.0)

## 2023-03-12 NOTE — Progress Notes (Signed)
ID:  ALVINO LECHUGA, DOB 05/23/1926, MRN [id number]   Provider location: [facility] Type of Visit: Established patient  PCP:  Gaspar Garbe, MD  Cardiologist:  Marca Ancona, MD   History of Present Illness: TENG DECOU is a 89 y.o. with history of DM and HTN as well as atrial fibrillation and CHF.    Patient was doing well until 11/18 when he developed exertional dyspnea.  He went to the ER 07/14/17 and was found to be in atrial fibrillation with volume overload.  He was admitted overnight, diuresed and started on Eliquis, and discharged.  He remained in atrial fibrillation and short of breath.   He was re-hospitalized with dyspnea in 12/18.  TEE-guided DCCV to NSR was done and he was diuresed. TEE showed EF 50%.    He was noted to be back in atrial fibrillation and was started on amiodarone.  TEE-guided DCCV in 1/19 was done, with conversion back to NSR.   He was admitted in 2/19 with syncope and found to have tachy-brady syndrome with junctional bradycardia (symptomatic). Medtronic dual chamber PPM was placed.   He was readmitted in 2/19 with pneumonia.  During this admission, he was noted to be back in atrial fibrillation but converted back to NSR.   In 12/20, he developed COVID-19 pneumonitis and was hospitalized at [facility].  He developed orthostatic hypotension after COVID-19.  He was started on midodrine.   PYP scan in 9/21 was not suggestive of TTR cardiac amyloidosis.   Echo in 9/22 showed EF 55-60%, mild LVH (reviewed echo).   Patient was noted to be in atrial fibrillation in 1/23 after stopping amiodarone.  Amiodarone was restarted and he was cardioverted to NSR in 1/23.  However, he only stayed in NSR for about 2 days based on device interrogation and has been back in persistent atrial fibrillation. He had DCCV again to NSR in 2/23 but was back in AF a couple of days later.   He returns for followup of atrial fibrillation and  CHF.  He has been stable recently.  Weight up 3 lbs.  He remains in chronic atrial fibrillation but rate is controlled.  He still goes to the [facility] 2 days/week and works with a Psychologist, educational as well.  He will ride an exercise bike for 20-30 minutes.  He walks with a walker for balance.  Occasional lightheadedness if he stands up too fast but no syncope or falls. He tires quickly and gets short of breath with moderate to heavy activity.   ECG (personally reviewed): atrial fibrillation, nonspecific T wave flattening  Labs (12/20): hgb 12.9, TSH normal, BNP 72, LFTs normal, K 3.9, creatinine 0.88 Labs (2/21): TSH normal, hgb 13, LDL 52, K 3.8, creatinine 1.32, LFTs normal Labs (6/21): hgb 13.2, TSH normal, K 4, creatinine 1.19, LFTs normal Labs (9/21): Myeloma panel and urine immunofixation negative, LFTs normal, K 4.1, creatinine 1.37 Labs (8/22): hgb 13.2, TSH normal, K 4.5, creatinine 1.22, LFTs normal.  Labs (10/22): K 3.9, creatinine 1.25 Labs (1/23): K 3.6, creatinine 1.06, hgb 12.6 Labs (2/23): K 3.8, creatinine 1.25, hgb 13.1 Labs (3/23): K 4.2, creatinine 1.33 Labs (10/23): K 4, creatinine 1.06, hgb 13.2 Labs (12/23): K 3.8, creatinine 1.07, LDL 47, TGs 101 Labs (3/24): K 3.6, creatinine 1.18, hgb 14.1, BNP 279  PMH: 1. Chronic diastolic CHF: Echo (11/18) with EF 50-55%, mild-moderate LVH, mild AI, mild MR, normal RV size and systolic function, PASP 45 mmHg.  -  TEE (12/18): EF 50%, normal RV size and systolic function, mild MR.  - TEE (1/19): EF 55-60%, mild LVH, mild MR, normal RV, aortic sclerosis without significant stenosis.  - PYP scan (9/21): grade 1, H/CL 1.03 => unlikely TTR cardiac amyloidosis.  - Echo (9/22): EF 55-60%, mild LVH (mild review) 2. Atrial fibrillation: Persistent, initially diagnosed in 11/18.  - TEE-guided DCCV in 12/18.  - TEE-guided DCCV in 1/19 on amiodarone.  - DCCV 1/23 on amiodarone - DCCV 2/23  3. Chest pain: LHC remotely without obstructive disease.  1/16  Cardiolite with no ischemia.  4. Type II diabetes 5. HTN 6. CKD: Stage 3.  7. Tachy-brady syndrome: Medtronic dual chamber.   8. Orthostatic hypotension.  9. COVID-19 pneumonitis 10. ABIs (6/19): Normal 11. Spinal stenosis 12. Carpal tunnel syndrome  Social History   Socioeconomic History   Marital status: Married    Spouse name: Not on file   Number of children: Not on file   Years of education: Not on file   Highest education level: Not on file  Occupational History   Not on file  Tobacco Use   Smoking status: Former    Current packs/day: 0.00    Types: Cigarettes    Quit date: 01/07/1992    Years since quitting: 31.1   Smokeless tobacco: Never  Substance and Sexual Activity   Alcohol use: Not Currently    Alcohol/week: 2.0 standard drinks of alcohol    Types: 2 Glasses of wine per week   Drug use: No   Sexual activity: Yes  Other Topics Concern   Not on file  Social History Narrative   Not on file   Social Determinants of Health   Financial Resource Strain: Not on file  Food Insecurity: Not on file  Transportation Needs: Not on file  Physical Activity: Not on file  Stress: Not on file  Social Connections: Not on file  Intimate Partner Violence: Not on file   Family History  Problem Relation Age of Onset   Cancer - Other Mother    CVA Father    Heart attack Brother    Heart attack Maternal Grandmother    ROS: All systems reviewed and negative except as per HPI.  Current Outpatient Medications  Medication Sig Dispense Refill   acetaminophen (TYLENOL) 500 MG tablet Take 1,000 mg by mouth every 8 (eight) hours as needed for mild pain or headache.     apixaban (ELIQUIS) 5 MG TABS tablet Take 1 tablet by mouth twice daily 60 tablet 11   Cholecalciferol (VITAMIN D3) 50 MCG (2000 UT) TABS Take 2,000 Units by mouth daily.     docusate sodium (COLACE) 100 MG capsule Patient takes 1 capsule by mouth every other day.     furosemide (LASIX) 40 MG tablet TAKE 1  TABLET BY MOUTH IN THE MORNING AND TAKE 1/2 (ONE-HALF) TABLET IN THE EVENING. 135 tablet 4   Menthol, Topical Analgesic, (BIOFREEZE ROLL-ON COLORLESS) 4 % GEL Apply 1 application topically daily as needed (pain).     metoprolol succinate (TOPROL-XL) 25 MG 24 hr tablet Take 1 tablet by mouth once daily 90 tablet 3   midodrine (PROAMATINE) 5 MG tablet TAKE 1 TABLET BY MOUTH THREE TIMES DAILY 180 tablet 3   Omega-3 Fatty Acids (FISH OIL) 1200 MG CAPS Take 1,200 mg by mouth daily.      potassium chloride SA (KLOR-CON M) 20 MEQ tablet Take 1 tablet by mouth once daily 90 tablet 3   rosuvastatin (CRESTOR)  10 MG tablet Take 1 tablet by mouth once daily 90 tablet 3   vitamin B-12 (CYANOCOBALAMIN) 1000 MCG tablet Take 1,000 mcg by mouth daily.     No current facility-administered medications for this encounter.   BP 102/64   Pulse (!) 106   Wt 106.5 kg (234 lb 12.8 oz)   SpO2 96%   BMI 31.84 kg/m  General: NAD Neck: No JVD, no thyromegaly or thyroid nodule.  Lungs: Clear to auscultation bilaterally with normal respiratory effort. CV: Nondisplaced PMI.  Heart irregular S1/S2, no S3/S4, no murmur.  1+ ankle edema.  No carotid bruit.  Normal pedal pulses.  Abdomen: Soft, nontender, no hepatosplenomegaly, no distention.  Skin: Intact without lesions or rashes.  Neurologic: Alert and oriented x 3.  Psych: Normal affect. Extremities: No clubbing or cyanosis.  HEENT: Normal.   Assessment/Plan: 1. Atrial fibrillation: DCCV in 12/18.  Recurrence of atrial fibrillation with initiation of amiodarone then TEE-guided DCCV in 1/19.  Amiodarone stopped fall of 2022, back in AF in 12/22.  Amiodarone restarted and he had DCCV to NSR in 1/23 but atrial fibrillation recurred < 2 days despite amiodarone.  DCCV again in 2/23 with recurrence of AF again.  He remains in atrial fibrillation today. Rate is controlled, he does not feel palpitations.  - He has failed amiodarone at this point and we will not re-attempt  DCCV.  - He will continue Toprol XL 25 mg daily for rate control.     - Continue apixaban. CBC today.  2. CKD: BMET today.   3. Chronic diastolic CHF: EF 62-37% from 1/19 TEE.  Prior workup negative for cardiac amyloidosis.  Echo in 9/22 with mild LVH, EF 55-60% (my review).  He does not appear significantly volume overloaded.  NYHA class II-III symptoms, stable.   - Continue Lasix 40 qam/20 qpm, BMET/BNP today.  - Continue KDur 20 mEq daily.  4. Orthostatic hypotension: This has been present since his COVID-19 infection and may be related.  This seems to be improved.  - He remains on low dose midodrine.  5. Hyperlipidemia: Good lipids in 12/23.   Followup in 4 months    Signed, Marca Ancona, MD  03/12/2023  [facility] [address] [facility] [place] [address] [phone] (office) [phone] (fax)

## 2023-03-12 NOTE — Patient Instructions (Signed)
Good to see you today!  No medication changes  Labs done today, your results will be available in MyChart, we will contact you for abnormal readings.  Your physician recommends that you schedule a follow-up appointment in: 6 months(January) Call office in November to schedule an appointment  If you have any questions or concerns before your next appointment please send [place] a message through [place] or call our office at [phone].    TO LEAVE A MESSAGE FOR THE NURSE SELECT OPTION 2, PLEASE LEAVE A MESSAGE INCLUDING: YOUR NAME DATE OF BIRTH CALL BACK NUMBER REASON FOR CALL**this is important as we prioritize the call backs  YOU WILL RECEIVE A CALL BACK THE SAME DAY AS LONG AS YOU CALL BEFORE 4:00 PM  At the [facility], you and your health needs are our priority. As part of our continuing mission to provide you with exceptional heart care, we have created designated Provider Care Teams. These Care Teams include your primary Cardiologist (physician) and Advanced Practice Providers (APPs- Physician Assistants and Nurse Practitioners) who all work together to provide you with the care you need, when you need it.   You may see any of the following providers on your designated Care Team at your next follow up: Dr Arvilla Meres Dr Marca Ancona Dr. Marcos Eke, NP Robbie Lis, [place] [facility] [place], [place] Brynda Peon, NP Karle Plumber, PharmD   Please be sure to bring in all your medications bottles to every appointment.    Thank you for choosing [facility]

## 2023-03-28 ENCOUNTER — Ambulatory Visit (INDEPENDENT_AMBULATORY_CARE_PROVIDER_SITE_OTHER): Payer: Medicare HMO

## 2023-03-28 DIAGNOSIS — I442 Atrioventricular block, complete: Secondary | ICD-10-CM

## 2023-03-28 LAB — CUP PACEART REMOTE DEVICE CHECK
Battery Remaining Longevity: 80 mo
Battery Voltage: 2.97 V
Brady Statistic RA Percent Paced: 0 %
Brady Statistic RV Percent Paced: 36.74 %
Date Time Interrogation Session: 20240808052708
Implantable Lead Connection Status: 753985
Implantable Lead Connection Status: 753985
Implantable Lead Implant Date: 20190216
Implantable Lead Implant Date: 20190216
Implantable Lead Location: 753859
Implantable Lead Location: 753860
Implantable Lead Model: 5076
Implantable Lead Model: 5076
Implantable Pulse Generator Implant Date: 20190216
Lead Channel Impedance Value: 304 Ohm
Lead Channel Impedance Value: 304 Ohm
Lead Channel Impedance Value: 380 Ohm
Lead Channel Impedance Value: 456 Ohm
Lead Channel Pacing Threshold Amplitude: 0.5 V
Lead Channel Pacing Threshold Amplitude: 0.875 V
Lead Channel Pacing Threshold Pulse Width: 0.4 ms
Lead Channel Pacing Threshold Pulse Width: 0.4 ms
Lead Channel Sensing Intrinsic Amplitude: 1.875 mV
Lead Channel Sensing Intrinsic Amplitude: 1.875 mV
Lead Channel Sensing Intrinsic Amplitude: 3.75 mV
Lead Channel Sensing Intrinsic Amplitude: 3.75 mV
Lead Channel Setting Pacing Amplitude: 2 V
Lead Channel Setting Pacing Pulse Width: 0.4 ms
Lead Channel Setting Sensing Sensitivity: 1.2 mV
Zone Setting Status: 755011
Zone Setting Status: 755011

## 2023-03-29 ENCOUNTER — Encounter: Payer: Self-pay | Admitting: Cardiology

## 2023-04-25 ENCOUNTER — Telehealth (HOSPITAL_COMMUNITY): Payer: Self-pay | Admitting: Cardiology

## 2023-04-25 NOTE — Telephone Encounter (Signed)
Patients son called to report BLE Noticeable increase over the past week Weight stable at 234 lb +increase in SOB and fatigue  Compliant with lasix 40/20   Add on visit 9/11 with provider  Will send to [facility] for medication adjustments if needed prior to OV 9/11

## 2023-04-25 NOTE — Telephone Encounter (Signed)
Increase Lasix to 40 mg bid and get BMET in 1 week.

## 2023-04-25 NOTE — Telephone Encounter (Signed)
Pt aware via son Gala Romney  Pt scheduled for Ov 9/11 will repeat labs at that time

## 2023-05-01 ENCOUNTER — Encounter: Payer: Self-pay | Admitting: Cardiology

## 2023-05-01 ENCOUNTER — Other Ambulatory Visit
Admission: RE | Admit: 2023-05-01 | Discharge: 2023-05-01 | Disposition: A | Discharge status: Home / Self Care | Payer: Medicare HMO | Source: Ambulatory Visit | Attending: Cardiology | Admitting: Cardiology

## 2023-05-01 ENCOUNTER — Ambulatory Visit (HOSPITAL_BASED_OUTPATIENT_CLINIC_OR_DEPARTMENT_OTHER): Payer: Medicare HMO | Admitting: Cardiology

## 2023-05-01 VITALS — BP 117/64 | HR 72 | Wt 239.0 lb

## 2023-05-01 DIAGNOSIS — I4891 Unspecified atrial fibrillation: Secondary | ICD-10-CM | POA: Insufficient documentation

## 2023-05-01 DIAGNOSIS — R0602 Shortness of breath: Secondary | ICD-10-CM | POA: Insufficient documentation

## 2023-05-01 DIAGNOSIS — I130 Hypertensive heart and chronic kidney disease with heart failure and stage 1 through stage 4 chronic kidney disease, or unspecified chronic kidney disease: Secondary | ICD-10-CM | POA: Diagnosis not present

## 2023-05-01 DIAGNOSIS — I5032 Chronic diastolic (congestive) heart failure: Secondary | ICD-10-CM | POA: Diagnosis not present

## 2023-05-01 LAB — BASIC METABOLIC PANEL
Anion gap: 7 (ref 5–15)
BUN: 19 mg/dL (ref 8–23)
CO2: 27 mmol/L (ref 22–32)
Calcium: 9 mg/dL (ref 8.9–10.3)
Chloride: 102 mmol/L (ref 98–111)
Creatinine, Ser: 0.89 mg/dL (ref 0.61–1.24)
GFR, Estimated: >60 mL/min (ref >60)
Glucose, Bld: 81 mg/dL (ref 70–99)
Potassium: 3.5 mmol/L (ref 3.5–5.1)
Sodium: 136 mmol/L (ref 135–145)

## 2023-05-01 LAB — BRAIN NATRIURETIC PEPTIDE: B Natriuretic Peptide: 296.4 pg/mL — ABNORMAL HIGH (ref 0.0–100.0)

## 2023-05-01 MED ORDER — FUROSEMIDE 40 MG PO TABS: 40.0000 mg | ORAL_TABLET | Freq: Two times a day (BID) | ORAL | 1 refills | Status: DC

## 2023-05-01 NOTE — Progress Notes (Signed)
ID:  Dylan [name], DOB [birth year], MRN [id number]   Provider location: [facility] Type of Visit: Established patient  PCP:  Gaspar Garbe, MD  Cardiologist:  Marca Ancona, MD   History of Present Illness: Dylan [name] is a 89 y.o. with history of DM and HTN as well as atrial fibrillation and CHF.    Patient was doing well until 11/18 when he developed exertional dyspnea.  He went to the ER 07/14/17 and was found to be in atrial fibrillation with volume overload.  He was admitted overnight, diuresed and started on Eliquis, and discharged.  He remained in atrial fibrillation and short of breath.   He was re-hospitalized with dyspnea in 12/18.  TEE-guided DCCV to NSR was done and he was diuresed. TEE showed EF 50%.    He was noted to be back in atrial fibrillation and was started on amiodarone.  TEE-guided DCCV in 1/19 was done, with conversion back to NSR.   He was admitted in 2/19 with syncope and found to have tachy-brady syndrome with junctional bradycardia (symptomatic). Medtronic dual chamber PPM was placed.   He was readmitted in 2/19 with pneumonia.  During this admission, he was noted to be back in atrial fibrillation but converted back to NSR.   In 12/20, he developed COVID-19 pneumonitis and was hospitalized at [facility].  He developed orthostatic hypotension after COVID-19.  He was started on midodrine.   PYP scan in 9/21 was not suggestive of TTR cardiac amyloidosis.   Echo in 9/22 showed EF 55-60%, mild LVH (reviewed echo).   Patient was noted to be in atrial fibrillation in 1/23 after stopping amiodarone.  Amiodarone was restarted and he was cardioverted to NSR in 1/23.  However, he only stayed in NSR for about 2 days based on device interrogation and has been back in persistent atrial fibrillation. He had DCCV again to NSR in 2/23 but was back in AF a couple of days later.   He returns for followup of atrial fibrillation and  CHF.  Last week, his son noted that his ankles were more swollen and that he was getting more short of breath. He was short of breath walking out to the car by the end of last week.  He had more pizza than usual last week and also had soup one day as well.  We increased his Lasix to 40 mg bid.  Since then, ankle swelling has improved.  Currently, he is back to baseline in terms of breathing.  Uses walker for stability.  Gets short of breath walking relatively long distances.  Doing his exercises with trainer.  No PND.  Sleeps on 2 pillows chronically.  No chest pain.   REDS clip 29%  Labs (12/20): hgb 12.9, TSH normal, BNP 72, LFTs normal, K 3.9, creatinine 0.88 Labs (2/21): TSH normal, hgb 13, LDL 52, K 3.8, creatinine 9.56, LFTs normal Labs (6/21): hgb 13.2, TSH normal, K 4, creatinine 1.19, LFTs normal Labs (9/21): Myeloma panel and urine immunofixation negative, LFTs normal, K 4.1, creatinine 1.37 Labs (8/22): hgb 13.2, TSH normal, K 4.5, creatinine 1.22, LFTs normal.  Labs (10/22): K 3.9, creatinine 1.25 Labs (1/23): K 3.6, creatinine 1.06, hgb 12.6 Labs (2/23): K 3.8, creatinine 1.25, hgb 13.1 Labs (3/23): K 4.2, creatinine 1.33 Labs (10/23): K 4, creatinine 1.06, hgb 13.2 Labs (12/23): K 3.8, creatinine 1.07, LDL 47, TGs 101 Labs (3/24): K 3.6, creatinine 1.18, hgb 14.1, BNP 279 Labs (7/24):  K 3.5, creatinine 1.26, BNP 431, hgb 13.3  PMH: 1. Chronic diastolic CHF: Echo (11/18) with EF 50-55%, mild-moderate LVH, mild AI, mild MR, normal RV size and systolic function, PASP 45 mmHg.  - TEE (12/18): EF 50%, normal RV size and systolic function, mild MR.  - TEE (1/19): EF 55-60%, mild LVH, mild MR, normal RV, aortic sclerosis without significant stenosis.  - PYP scan (9/21): grade 1, H/CL 1.03 => unlikely TTR cardiac amyloidosis.  - Echo (9/22): EF 55-60%, mild LVH (mild review) 2. Atrial fibrillation: Persistent, initially diagnosed in 11/18.  - TEE-guided DCCV in 12/18.  - TEE-guided  DCCV in 1/19 on amiodarone.  - DCCV 1/23 on amiodarone - DCCV 2/23  3. Chest pain: LHC remotely without obstructive disease.  1/16 Cardiolite with no ischemia.  4. Type II diabetes 5. HTN 6. CKD: Stage 3.  7. Tachy-brady syndrome: Medtronic dual chamber.   8. Orthostatic hypotension.  9. COVID-19 pneumonitis 10. ABIs (6/19): Normal 11. Spinal stenosis 12. Carpal tunnel syndrome  Social History   Socioeconomic History   Marital status: Married    Spouse name: Not on file   Number of children: Not on file   Years of education: Not on file   Highest education level: Not on file  Occupational History   Not on file  Tobacco Use   Smoking status: Former    Current packs/day: 0.00    Types: Cigarettes    Quit date: 01/07/1992    Years since quitting: 31.3   Smokeless tobacco: Never  Substance and Sexual Activity   Alcohol use: Not Currently    Alcohol/week: 2.0 standard drinks of alcohol    Types: 2 Glasses of wine per week   Drug use: No   Sexual activity: Yes  Other Topics Concern   Not on file  Social History Narrative   Not on file   Social Determinants of Health   Financial Resource Strain: Not on file  Food Insecurity: Not on file  Transportation Needs: Not on file  Physical Activity: Not on file  Stress: Not on file  Social Connections: Not on file  Intimate Partner Violence: Not on file   Family History  Problem Relation Age of Onset   Cancer - Other Mother    CVA Father    Heart attack Brother    Heart attack Maternal Grandmother    ROS: All systems reviewed and negative except as per HPI.  Current Outpatient Medications  Medication Sig Dispense Refill   acetaminophen (TYLENOL) 500 MG tablet Take 1,000 mg by mouth every 8 (eight) hours as needed for mild pain or headache.     apixaban (ELIQUIS) 5 MG TABS tablet Take 1 tablet by mouth twice daily 60 tablet 11   Cholecalciferol (VITAMIN D3) 50 MCG (2000 UT) TABS Take 2,000 Units by mouth daily.      docusate sodium (COLACE) 100 MG capsule Patient takes 1 capsule by mouth every other day.     furosemide (LASIX) 40 MG tablet Take 1 tablet (40 mg total) by mouth 2 (two) times daily. 180 tablet 1   Menthol, Topical Analgesic, (BIOFREEZE ROLL-ON COLORLESS) 4 % GEL Apply 1 application topically daily as needed (pain).     metoprolol succinate (TOPROL-XL) 25 MG 24 hr tablet Take 1 tablet by mouth once daily 90 tablet 3   midodrine (PROAMATINE) 5 MG tablet TAKE 1 TABLET BY MOUTH THREE TIMES DAILY 180 tablet 3   Omega-3 Fatty Acids (FISH OIL) 1200 MG CAPS Take  1,200 mg by mouth daily.      potassium chloride SA (KLOR-CON M) 20 MEQ tablet Take 1 tablet by mouth once daily 90 tablet 3   rosuvastatin (CRESTOR) 10 MG tablet Take 1 tablet by mouth once daily 90 tablet 3   vitamin B-12 (CYANOCOBALAMIN) 1000 MCG tablet Take 1,000 mcg by mouth daily.     No current facility-administered medications for this visit.   BP 117/64   Pulse 72   Wt 239 lb (108.4 kg)   SpO2 97%   BMI 32.41 kg/m  General: NAD Neck: No JVD, no thyromegaly or thyroid nodule.  Lungs: Clear to auscultation bilaterally with normal respiratory effort. CV: Nondisplaced PMI.  Heart irregular S1/S2, no S3/S4, no murmur.  1+ ankle edema.  No carotid bruit.  Normal pedal pulses.  Abdomen: Soft, nontender, no hepatosplenomegaly, no distention.  Skin: Intact without lesions or rashes.  Neurologic: Alert and oriented x 3.  Psych: Normal affect. Extremities: No clubbing or cyanosis.  HEENT: Normal.   Assessment/Plan: 1. Atrial fibrillation: DCCV in 12/18.  Recurrence of atrial fibrillation with initiation of amiodarone then TEE-guided DCCV in 1/19.  Amiodarone stopped fall of 2022, back in AF in 12/22.  Amiodarone restarted and he had DCCV to NSR in 1/23 but atrial fibrillation recurred < 2 days despite amiodarone.  DCCV again in 2/23 with recurrence of AF again.  He remains in atrial fibrillation today. Rate is controlled, he does not  feel palpitations.  - He has failed amiodarone at this point and we will not re-attempt DCCV.  - He will continue Toprol XL 25 mg daily for rate control.     - Continue apixaban.  2. CKD: BMET today.   3. Chronic diastolic CHF: EF 03-47% from 1/19 TEE.  Prior workup negative for cardiac amyloidosis.  Echo in 9/22 with mild LVH, EF 55-60% (my review).  Developed volume overload and increased dyspnea last week likely due to increased dietary sodium.  We increased Lasix to 40 mg bid, and now he does not appear significantly volume overloaded by exam and REDS clip is normal.  NYHA class II-III symptoms, stable.   - Continue higher Lasix dose at 40 mg bid. BMET/BNP today.  - Continue KDur 20 mEq daily.  4. Orthostatic hypotension: This has been present since his COVID-19 infection and may be related.  This seems to be improved.  - He remains on low dose midodrine.  5. Hyperlipidemia: Good lipids in 12/23.   Followup in 6 wks.     Signed, Marca Ancona, MD  05/01/2023  [facility] [place] [address] [facility] [place] [address] [phone] (office) [phone] (fax)

## 2023-05-01 NOTE — Patient Instructions (Addendum)
Continue taking Lasix 40 MG TWICE DAILY  Go over to the [facility]. Go pass the gift shop and have your blood work completed.  Follow up in 6 weeks in [place] with Dr. Shirlee Latch. Please call [phone] to schedule this appt.

## 2023-05-06 DIAGNOSIS — Z87891 Personal history of nicotine dependence: Secondary | ICD-10-CM | POA: Diagnosis not present

## 2023-05-06 DIAGNOSIS — I951 Orthostatic hypotension: Secondary | ICD-10-CM | POA: Diagnosis not present

## 2023-05-06 DIAGNOSIS — I4891 Unspecified atrial fibrillation: Secondary | ICD-10-CM | POA: Diagnosis not present

## 2023-05-06 DIAGNOSIS — I509 Heart failure, unspecified: Secondary | ICD-10-CM | POA: Diagnosis not present

## 2023-05-06 DIAGNOSIS — D6869 Other thrombophilia: Secondary | ICD-10-CM | POA: Diagnosis not present

## 2023-05-06 DIAGNOSIS — R32 Unspecified urinary incontinence: Secondary | ICD-10-CM | POA: Diagnosis not present

## 2023-05-06 DIAGNOSIS — R2681 Unsteadiness on feet: Secondary | ICD-10-CM | POA: Diagnosis not present

## 2023-05-06 DIAGNOSIS — G629 Polyneuropathy, unspecified: Secondary | ICD-10-CM | POA: Diagnosis not present

## 2023-05-06 DIAGNOSIS — E785 Hyperlipidemia, unspecified: Secondary | ICD-10-CM | POA: Diagnosis not present

## 2023-05-06 DIAGNOSIS — E669 Obesity, unspecified: Secondary | ICD-10-CM | POA: Diagnosis not present

## 2023-05-06 DIAGNOSIS — M1990 Unspecified osteoarthritis, unspecified site: Secondary | ICD-10-CM | POA: Diagnosis not present

## 2023-05-06 DIAGNOSIS — I2510 Atherosclerotic heart disease of native coronary artery without angina pectoris: Secondary | ICD-10-CM | POA: Diagnosis not present

## 2023-05-09 DIAGNOSIS — G5601 Carpal tunnel syndrome, right upper limb: Secondary | ICD-10-CM | POA: Diagnosis not present

## 2023-05-13 ENCOUNTER — Encounter: Payer: Self-pay | Admitting: Podiatry

## 2023-05-13 ENCOUNTER — Ambulatory Visit (INDEPENDENT_AMBULATORY_CARE_PROVIDER_SITE_OTHER): Payer: Medicare HMO | Admitting: Podiatry

## 2023-05-13 DIAGNOSIS — E1159 Type 2 diabetes mellitus with other circulatory complications: Secondary | ICD-10-CM | POA: Diagnosis not present

## 2023-05-13 DIAGNOSIS — B351 Tinea unguium: Secondary | ICD-10-CM

## 2023-05-13 DIAGNOSIS — M79675 Pain in left toe(s): Secondary | ICD-10-CM

## 2023-05-13 DIAGNOSIS — M79674 Pain in right toe(s): Secondary | ICD-10-CM | POA: Diagnosis not present

## 2023-05-13 DIAGNOSIS — I739 Peripheral vascular disease, unspecified: Secondary | ICD-10-CM | POA: Diagnosis not present

## 2023-05-13 NOTE — Progress Notes (Signed)
This patient returns to my office for at risk foot care.  This patient requires this care by a professional since this patient will be at risk due to having diabetic neuropathy,and coagulation defect.  Patient is taking eliquis.    Patient presents to the office with his wife.  This patient is unable to cut nails himself since the patient cannot reach his nails.These nails are painful walking and wearing  shoes. This patient presents for at risk foot care today.  General Appearance  Alert, conversant and in no acute stress.  Vascular  Dorsalis pedis and posterior tibial  pulses are palpable  Left foot . Absent dorsalis pedis and posterior tibial pulses right foot.  Capillary return is within normal limits  bilaterally. Temperature is within normal limits  Bilaterally.  Venous stasis  B/L.  Neurologic  Senn-Weinstein monofilament wire test diminished  bilaterally. Muscle power within normal limits bilaterally.  Nails Thick disfigured discolored nails with subungual debris  from hallux to fifth toes bilaterally. No evidence of bacterial infection or drainage bilaterally.  Orthopedic  No limitations of motion  feet .  No crepitus or effusions noted.  Hallux malleus IPJ  B/L.  Hammer toes 2  B/L.  Midfoot DJD  B/L.  HAV  B/L.     Skin  normotropic skin with no porokeratosis noted bilaterally.  No signs of infections or ulcers noted.     Onychomycosis  Pain in right toes  Pain in left toes.  Contusion left hallux.  Consent was obtained for treatment procedures.   Mechanical debridement of nails 1-5  bilaterally performed with a nail nipper.     Return office visit    4   months                  Told patient to return for periodic foot care and evaluation due to potential at risk complications.   Helane Gunther DPM

## 2023-05-14 NOTE — Progress Notes (Signed)
Remote pacemaker transmission.   

## 2023-05-20 DIAGNOSIS — L98499 Non-pressure chronic ulcer of skin of other sites with unspecified severity: Secondary | ICD-10-CM | POA: Diagnosis not present

## 2023-06-01 DIAGNOSIS — Z23 Encounter for immunization: Secondary | ICD-10-CM | POA: Diagnosis not present

## 2023-06-06 ENCOUNTER — Encounter (HOSPITAL_COMMUNITY): Payer: Self-pay | Admitting: Cardiology

## 2023-06-06 ENCOUNTER — Ambulatory Visit (HOSPITAL_COMMUNITY)
Admission: RE | Admit: 2023-06-06 | Discharge: 2023-06-06 | Disposition: A | Discharge status: Home / Self Care | Payer: Medicare HMO | Source: Ambulatory Visit | Attending: Cardiology | Admitting: Cardiology

## 2023-06-06 VITALS — BP 102/60 | HR 62 | Wt 233.0 lb

## 2023-06-06 DIAGNOSIS — E1122 Type 2 diabetes mellitus with diabetic chronic kidney disease: Secondary | ICD-10-CM | POA: Diagnosis not present

## 2023-06-06 DIAGNOSIS — I509 Heart failure, unspecified: Secondary | ICD-10-CM | POA: Diagnosis not present

## 2023-06-06 DIAGNOSIS — I5032 Chronic diastolic (congestive) heart failure: Secondary | ICD-10-CM | POA: Diagnosis not present

## 2023-06-06 DIAGNOSIS — I130 Hypertensive heart and chronic kidney disease with heart failure and stage 1 through stage 4 chronic kidney disease, or unspecified chronic kidney disease: Secondary | ICD-10-CM | POA: Diagnosis not present

## 2023-06-06 DIAGNOSIS — I110 Hypertensive heart disease with heart failure: Secondary | ICD-10-CM | POA: Diagnosis not present

## 2023-06-06 DIAGNOSIS — N182 Chronic kidney disease, stage 2 (mild): Secondary | ICD-10-CM | POA: Diagnosis not present

## 2023-06-06 DIAGNOSIS — I4819 Other persistent atrial fibrillation: Secondary | ICD-10-CM | POA: Insufficient documentation

## 2023-06-06 DIAGNOSIS — I951 Orthostatic hypotension: Secondary | ICD-10-CM | POA: Insufficient documentation

## 2023-06-06 DIAGNOSIS — R9431 Abnormal electrocardiogram [ECG] [EKG]: Secondary | ICD-10-CM | POA: Diagnosis not present

## 2023-06-06 DIAGNOSIS — E785 Hyperlipidemia, unspecified: Secondary | ICD-10-CM | POA: Diagnosis not present

## 2023-06-06 DIAGNOSIS — I4891 Unspecified atrial fibrillation: Secondary | ICD-10-CM | POA: Diagnosis not present

## 2023-06-06 LAB — BASIC METABOLIC PANEL
Anion gap: 12 (ref 5–15)
BUN: 12 mg/dL (ref 8–23)
CO2: 25 mmol/L (ref 22–32)
Calcium: 9.4 mg/dL (ref 8.9–10.3)
Chloride: 101 mmol/L (ref 98–111)
Creatinine, Ser: 1.09 mg/dL (ref 0.61–1.24)
GFR, Estimated: >60 mL/min (ref >60)
Glucose, Bld: 73 mg/dL (ref 70–99)
Potassium: 3.7 mmol/L (ref 3.5–5.1)
Sodium: 138 mmol/L (ref 135–145)

## 2023-06-06 LAB — BRAIN NATRIURETIC PEPTIDE: B Natriuretic Peptide: 288.3 pg/mL — ABNORMAL HIGH (ref 0.0–100.0)

## 2023-06-06 NOTE — Progress Notes (Signed)
Medication Samples have been provided to the patient.  Drug name: Eliquis       Strength: 5 mg        Qty: 4  LOT: [id number]  Exp.Date: 09/2024   Dosing instructions: Take 1 tablet Twice daily   The patient has been instructed regarding the correct time, dose, and frequency of taking this medication, including desired effects and most common side effects.   Dylan [name] Dylan [name] 9:20 AM 06/06/2023

## 2023-06-06 NOTE — Patient Instructions (Addendum)
There has been no changes to your medications   Labs done today, your results will be available in MyChart, we will contact you for abnormal readings.  Your physician recommends that you schedule a follow-up appointment in: 3 months ( January 2025) ** PLEASE CALL THE OFFICE IN NOVEMBER TO ARRANGE YOUR FOLLOW UP APPOINTMENT. **  If you have any questions or concerns before your next appointment please send [place] a message through [place] or call our office at [phone].    TO LEAVE A MESSAGE FOR THE NURSE SELECT OPTION 2, PLEASE LEAVE A MESSAGE INCLUDING: YOUR NAME DATE OF BIRTH CALL BACK NUMBER REASON FOR CALL**this is important as we prioritize the call backs  YOU WILL RECEIVE A CALL BACK THE SAME DAY AS LONG AS YOU CALL BEFORE 4:00 PM  At the [facility], you and your health needs are our priority. As part of our continuing mission to provide you with exceptional heart care, we have created designated Provider Care Teams. These Care Teams include your primary Cardiologist (physician) and Advanced Practice Providers (APPs- Physician Assistants and Nurse Practitioners) who all work together to provide you with the care you need, when you need it.   You may see any of the following providers on your designated Care Team at your next follow up: Dr Arvilla Meres Dr Marca Ancona Dr. Dorthula Nettles Dr. Clearnce Hasten Amy Filbert Schilder, NP Robbie Lis, [place] [facility] [place], [place] Brynda Peon, NP [place] Lee, NP Karle Plumber, PharmD   Please be sure to bring in all your medications bottles to every appointment.    Thank you for choosing [facility]

## 2023-06-06 NOTE — Progress Notes (Signed)
ID:  Dylan [name], DOB 1926/03/30, MRN [id number]   Provider location:  [facility] Type of Visit: Established patient  PCP:  Gaspar Garbe, MD  Cardiologist:  Marca Ancona, MD   History of Present Illness: Dylan [name] is a 89 y.o. with history of DM and HTN as well as atrial fibrillation and CHF.    Patient was doing well until 11/18 when he developed exertional dyspnea.  He went to the ER 07/14/17 and was found to be in atrial fibrillation with volume overload.  He was admitted overnight, diuresed and started on Eliquis, and discharged.  He remained in atrial fibrillation and short of breath.   He was re-hospitalized with dyspnea in 12/18.  TEE-guided DCCV to NSR was done and he was diuresed. TEE showed EF 50%.    He was noted to be back in atrial fibrillation and was started on amiodarone.  TEE-guided DCCV in 1/19 was done, with conversion back to NSR.   He was admitted in 2/19 with syncope and found to have tachy-brady syndrome with junctional bradycardia (symptomatic). Medtronic dual chamber PPM was placed.   He was readmitted in 2/19 with pneumonia.  During this admission, he was noted to be back in atrial fibrillation but converted back to NSR.   In 12/20, he developed COVID-19 pneumonitis and was hospitalized at [facility].  He developed orthostatic hypotension after COVID-19.  He was started on midodrine.   PYP scan in 9/21 was not suggestive of TTR cardiac amyloidosis.   Echo in 9/22 showed EF 55-60%, mild LVH (reviewed echo).   Patient was noted to be in atrial fibrillation in 1/23 after stopping amiodarone.  Amiodarone was restarted and he was cardioverted to NSR in 1/23.  However, he only stayed in NSR for about 2 days based on device interrogation and has been back in persistent atrial fibrillation. He had DCCV again to NSR in 2/23 but was back in AF a couple of days later.   He returns for followup of atrial fibrillation and  CHF.  Weight is down 6 lbs on higher Lasix dose.  He had a mechanical fall earlier this week when he stood up too fast and lost his balance.  No loss of consciousness or serious injury.  He is short of breath after walking 50-100 feet.  Still goes to the [facility] to exercise several days/week. No orthopnea/PND.  No chest pain.  He remains in atrial fibrillation, does not feel palpitations.   ECG (personally reviewed): atrial fibrillation with v-pacing  Labs (12/20): hgb 12.9, TSH normal, BNP 72, LFTs normal, K 3.9, creatinine 0.88 Labs (2/21): TSH normal, hgb 13, LDL 52, K 3.8, creatinine 8.41, LFTs normal Labs (6/21): hgb 13.2, TSH normal, K 4, creatinine 1.19, LFTs normal Labs (9/21): Myeloma panel and urine immunofixation negative, LFTs normal, K 4.1, creatinine 1.37 Labs (8/22): hgb 13.2, TSH normal, K 4.5, creatinine 1.22, LFTs normal.  Labs (10/22): K 3.9, creatinine 1.25 Labs (1/23): K 3.6, creatinine 1.06, hgb 12.6 Labs (2/23): K 3.8, creatinine 1.25, hgb 13.1 Labs (3/23): K 4.2, creatinine 1.33 Labs (10/23): K 4, creatinine 1.06, hgb 13.2 Labs (12/23): K 3.8, creatinine 1.07, LDL 47, TGs 101 Labs (3/24): K 3.6, creatinine 1.18, hgb 14.1, BNP 279 Labs (7/24): K 3.5, creatinine 1.26, BNP 431, hgb 13.3 Labs (9/24): K 3.5, creatinine 0.89, BNP 296  PMH: 1. Chronic diastolic CHF: Echo (11/18) with EF 50-55%, mild-moderate LVH, mild AI, mild MR, normal RV size and  systolic function, PASP 45 mmHg.  - TEE (12/18): EF 50%, normal RV size and systolic function, mild MR.  - TEE (1/19): EF 55-60%, mild LVH, mild MR, normal RV, aortic sclerosis without significant stenosis.  - PYP scan (9/21): grade 1, H/CL 1.03 => unlikely TTR cardiac amyloidosis.  - Echo (9/22): EF 55-60%, mild LVH (mild review) 2. Atrial fibrillation: Persistent, initially diagnosed in 11/18.  - TEE-guided DCCV in 12/18.  - TEE-guided DCCV in 1/19 on amiodarone.  - DCCV 1/23 on amiodarone - DCCV 2/23  3. Chest pain: LHC  remotely without obstructive disease.  1/16 Cardiolite with no ischemia.  4. Type II diabetes 5. HTN 6. CKD: Stage 3.  7. Tachy-brady syndrome: Medtronic dual chamber.   8. Orthostatic hypotension.  9. COVID-19 pneumonitis 10. ABIs (6/19): Normal 11. Spinal stenosis 12. Carpal tunnel syndrome  Social History   Socioeconomic History   Marital status: Married    Spouse name: Not on file   Number of children: Not on file   Years of education: Not on file   Highest education level: Not on file  Occupational History   Not on file  Tobacco Use   Smoking status: Former    Current packs/day: 0.00    Types: Cigarettes    Quit date: 01/07/1992    Years since quitting: 31.4   Smokeless tobacco: Never  Substance and Sexual Activity   Alcohol use: Not Currently    Alcohol/week: 2.0 standard drinks of alcohol    Types: 2 Glasses of wine per week   Drug use: No   Sexual activity: Yes  Other Topics Concern   Not on file  Social History Narrative   Not on file   Social Determinants of Health   Financial Resource Strain: Not on file  Food Insecurity: Not on file  Transportation Needs: Not on file  Physical Activity: Not on file  Stress: Not on file  Social Connections: Not on file  Intimate Partner Violence: Not on file   Family History  Problem Relation Age of Onset   Cancer - Other Mother    CVA Father    Heart attack Brother    Heart attack Maternal Grandmother    ROS: All systems reviewed and negative except as per HPI.  Current Outpatient Medications  Medication Sig Dispense Refill   acetaminophen (TYLENOL) 500 MG tablet Take 1,000 mg by mouth every 8 (eight) hours as needed for mild pain or headache.     apixaban (ELIQUIS) 5 MG TABS tablet Take 1 tablet by mouth twice daily 60 tablet 11   Cholecalciferol (VITAMIN D3) 50 MCG (2000 UT) TABS Take 2,000 Units by mouth daily.     docusate sodium (COLACE) 100 MG capsule Patient takes 1 capsule by mouth every other day.      furosemide (LASIX) 40 MG tablet Take 1 tablet (40 mg total) by mouth 2 (two) times daily. 180 tablet 1   Menthol, Topical Analgesic, (BIOFREEZE ROLL-ON COLORLESS) 4 % GEL Apply 1 application topically daily as needed (pain).     metoprolol succinate (TOPROL-XL) 25 MG 24 hr tablet Take 1 tablet by mouth once daily 90 tablet 3   midodrine (PROAMATINE) 5 MG tablet TAKE 1 TABLET BY MOUTH THREE TIMES DAILY 180 tablet 3   Omega-3 Fatty Acids (FISH OIL) 1200 MG CAPS Take 1,200 mg by mouth daily.      potassium chloride SA (KLOR-CON M) 20 MEQ tablet Take 1 tablet by mouth once daily 90 tablet 3  rosuvastatin (CRESTOR) 10 MG tablet Take 1 tablet by mouth once daily 90 tablet 3   vitamin B-12 (CYANOCOBALAMIN) 1000 MCG tablet Take 1,000 mcg by mouth daily.     No current facility-administered medications for this encounter.   BP 102/60   Pulse 62   Wt 105.7 kg (233 lb)   SpO2 94%   BMI 31.60 kg/m  General: NAD Neck: No JVD, no thyromegaly or thyroid nodule.  Lungs: Clear to auscultation bilaterally with normal respiratory effort. CV: Nondisplaced PMI.  Heart irregular S1/S2, no S3/S4, no murmur.  No peripheral edema.  No carotid bruit.  Normal pedal pulses.  Abdomen: Soft, nontender, no hepatosplenomegaly, no distention.  Skin: Intact without lesions or rashes.  Neurologic: Alert and oriented x 3.  Psych: Normal affect. Extremities: No clubbing or cyanosis.  HEENT: Normal.   Assessment/Plan: 1. Atrial fibrillation: DCCV in 12/18.  Recurrence of atrial fibrillation with initiation of amiodarone then TEE-guided DCCV in 1/19.  Amiodarone stopped fall of 2022, back in AF in 12/22.  Amiodarone restarted and he had DCCV to NSR in 1/23 but atrial fibrillation recurred < 2 days despite amiodarone.  DCCV again in 2/23 with recurrence of AF again.  He remains in atrial fibrillation today. Rate is controlled, he does not feel palpitations.  - He has failed amiodarone at this point and we will not  re-attempt DCCV.  - He will continue Toprol XL 25 mg daily for rate control.     - Continue apixaban.  2. CKD: BMET today.   3. Chronic diastolic CHF: EF 21-30% from 1/19 TEE.  Prior workup negative for cardiac amyloidosis.  Echo in 9/22 with mild LVH, EF 55-60% (my review).  Stable NYHA class II-III symptoms.  He is not volume overloaded on exam today.  - Continue Lasix 40 mg bid. BMET/BNP today.  - Continue KDur 20 mEq daily.  4. Orthostatic hypotension: This has been present since his COVID-19 infection and may be related.  This seems to be improved.  - He remains on low dose midodrine.  5. Hyperlipidemia: Good lipids in 12/23.   Followup in 3 months    Signed, Marca Ancona, MD  06/06/2023  [facility] [address] [facility] [place] [address] [phone] (office) [phone] (fax)

## 2023-06-13 ENCOUNTER — Encounter (HOSPITAL_COMMUNITY): Payer: Medicare HMO | Admitting: Cardiology

## 2023-06-18 ENCOUNTER — Encounter: Payer: Self-pay | Admitting: Cardiology

## 2023-06-27 ENCOUNTER — Ambulatory Visit (INDEPENDENT_AMBULATORY_CARE_PROVIDER_SITE_OTHER): Payer: Medicare HMO

## 2023-06-27 DIAGNOSIS — I495 Sick sinus syndrome: Secondary | ICD-10-CM

## 2023-06-27 LAB — CUP PACEART REMOTE DEVICE CHECK
Battery Remaining Longevity: 71 mo
Battery Voltage: 2.96 V
Brady Statistic RA Percent Paced: 0 %
Brady Statistic RV Percent Paced: 42.81 %
Date Time Interrogation Session: 20241106203942
Implantable Lead Connection Status: 753985
Implantable Lead Connection Status: 753985
Implantable Lead Implant Date: 20190216
Implantable Lead Implant Date: 20190216
Implantable Lead Location: 753859
Implantable Lead Location: 753860
Implantable Lead Model: 5076
Implantable Lead Model: 5076
Implantable Pulse Generator Implant Date: 20190216
Lead Channel Impedance Value: 304 Ohm
Lead Channel Impedance Value: 323 Ohm
Lead Channel Impedance Value: 380 Ohm
Lead Channel Impedance Value: 456 Ohm
Lead Channel Pacing Threshold Amplitude: 0.5 V
Lead Channel Pacing Threshold Amplitude: 0.875 V
Lead Channel Pacing Threshold Pulse Width: 0.4 ms
Lead Channel Pacing Threshold Pulse Width: 0.4 ms
Lead Channel Sensing Intrinsic Amplitude: 1 mV
Lead Channel Sensing Intrinsic Amplitude: 1 mV
Lead Channel Sensing Intrinsic Amplitude: 3.875 mV
Lead Channel Sensing Intrinsic Amplitude: 3.875 mV
Lead Channel Setting Pacing Amplitude: 2 V
Lead Channel Setting Pacing Pulse Width: 0.4 ms
Lead Channel Setting Sensing Sensitivity: 1.2 mV
Zone Setting Status: 755011
Zone Setting Status: 755011

## 2023-07-15 NOTE — Progress Notes (Signed)
Remote pacemaker transmission.   

## 2023-08-02 DIAGNOSIS — I480 Paroxysmal atrial fibrillation: Secondary | ICD-10-CM | POA: Diagnosis not present

## 2023-08-02 DIAGNOSIS — Z950 Presence of cardiac pacemaker: Secondary | ICD-10-CM | POA: Diagnosis not present

## 2023-08-02 DIAGNOSIS — M160 Bilateral primary osteoarthritis of hip: Secondary | ICD-10-CM | POA: Diagnosis not present

## 2023-08-02 DIAGNOSIS — E7800 Pure hypercholesterolemia, unspecified: Secondary | ICD-10-CM | POA: Diagnosis not present

## 2023-08-02 DIAGNOSIS — M6281 Muscle weakness (generalized): Secondary | ICD-10-CM | POA: Diagnosis not present

## 2023-08-02 DIAGNOSIS — Z7901 Long term (current) use of anticoagulants: Secondary | ICD-10-CM | POA: Diagnosis not present

## 2023-08-02 DIAGNOSIS — I5032 Chronic diastolic (congestive) heart failure: Secondary | ICD-10-CM | POA: Diagnosis not present

## 2023-08-02 DIAGNOSIS — E669 Obesity, unspecified: Secondary | ICD-10-CM | POA: Diagnosis not present

## 2023-08-02 DIAGNOSIS — I110 Hypertensive heart disease with heart failure: Secondary | ICD-10-CM | POA: Diagnosis not present

## 2023-08-02 DIAGNOSIS — G6289 Other specified polyneuropathies: Secondary | ICD-10-CM | POA: Diagnosis not present

## 2023-08-02 DIAGNOSIS — E1151 Type 2 diabetes mellitus with diabetic peripheral angiopathy without gangrene: Secondary | ICD-10-CM | POA: Diagnosis not present

## 2023-08-02 DIAGNOSIS — D6869 Other thrombophilia: Secondary | ICD-10-CM | POA: Diagnosis not present

## 2023-09-09 ENCOUNTER — Encounter: Payer: Self-pay | Admitting: Podiatry

## 2023-09-09 ENCOUNTER — Ambulatory Visit (INDEPENDENT_AMBULATORY_CARE_PROVIDER_SITE_OTHER): Payer: Medicare HMO | Admitting: Podiatry

## 2023-09-09 DIAGNOSIS — M79674 Pain in right toe(s): Secondary | ICD-10-CM

## 2023-09-09 DIAGNOSIS — E1159 Type 2 diabetes mellitus with other circulatory complications: Secondary | ICD-10-CM | POA: Diagnosis not present

## 2023-09-09 DIAGNOSIS — B351 Tinea unguium: Secondary | ICD-10-CM | POA: Diagnosis not present

## 2023-09-09 DIAGNOSIS — M79675 Pain in left toe(s): Secondary | ICD-10-CM | POA: Diagnosis not present

## 2023-09-09 NOTE — Progress Notes (Signed)
This patient returns to my office for at risk foot care.  This patient requires this care by a professional since this patient will be at risk due to having diabetic neuropathy,and coagulation defect.  Patient is taking eliquis.    Patient presents to the office with his wife.  This patient is unable to cut nails himself since the patient cannot reach his nails.These nails are painful walking and wearing  shoes. This patient presents for at risk foot care today.  General Appearance  Alert, conversant and in no acute stress.  Vascular  Dorsalis pedis and posterior tibial  pulses are palpable  Left foot . Absent dorsalis pedis and posterior tibial pulses right foot.  Capillary return is within normal limits  bilaterally. Temperature is within normal limits  Bilaterally.  Venous stasis  B/L.  Neurologic  Senn-Weinstein monofilament wire test diminished  bilaterally. Muscle power within normal limits bilaterally.  Nails Thick disfigured discolored nails with subungual debris  from hallux to fifth toes bilaterally. No evidence of bacterial infection or drainage bilaterally.  Orthopedic  No limitations of motion  feet .  No crepitus or effusions noted.  Hallux malleus IPJ  B/L.  Hammer toes 2  B/L.  Midfoot DJD  B/L.  HAV  B/L.     Skin  normotropic skin with no porokeratosis noted bilaterally.  No signs of infections or ulcers noted.     Onychomycosis  Pain in right toes  Pain in left toes.  Contusion left hallux.  Consent was obtained for treatment procedures.   Mechanical debridement of nails 1-5  bilaterally performed with a nail nipper.     Return office visit    4   months                  Told patient to return for periodic foot care and evaluation due to potential at risk complications.   Helane Gunther DPM

## 2023-09-16 ENCOUNTER — Encounter (HOSPITAL_COMMUNITY): Payer: Self-pay | Admitting: Cardiology

## 2023-09-16 ENCOUNTER — Ambulatory Visit (HOSPITAL_COMMUNITY)
Admission: RE | Admit: 2023-09-16 | Discharge: 2023-09-16 | Disposition: A | Discharge status: Home / Self Care | Payer: Medicare HMO | Source: Ambulatory Visit | Attending: Cardiology | Admitting: Cardiology

## 2023-09-16 VITALS — BP 90/50 | HR 67 | Wt 236.6 lb

## 2023-09-16 DIAGNOSIS — Z8616 Personal history of COVID-19: Secondary | ICD-10-CM | POA: Insufficient documentation

## 2023-09-16 DIAGNOSIS — Z7901 Long term (current) use of anticoagulants: Secondary | ICD-10-CM | POA: Diagnosis not present

## 2023-09-16 DIAGNOSIS — N183 Chronic kidney disease, stage 3 unspecified: Secondary | ICD-10-CM | POA: Diagnosis not present

## 2023-09-16 DIAGNOSIS — R42 Dizziness and giddiness: Secondary | ICD-10-CM | POA: Insufficient documentation

## 2023-09-16 DIAGNOSIS — I130 Hypertensive heart and chronic kidney disease with heart failure and stage 1 through stage 4 chronic kidney disease, or unspecified chronic kidney disease: Secondary | ICD-10-CM | POA: Insufficient documentation

## 2023-09-16 DIAGNOSIS — I5032 Chronic diastolic (congestive) heart failure: Secondary | ICD-10-CM | POA: Insufficient documentation

## 2023-09-16 DIAGNOSIS — I4819 Other persistent atrial fibrillation: Secondary | ICD-10-CM | POA: Diagnosis not present

## 2023-09-16 DIAGNOSIS — Z87891 Personal history of nicotine dependence: Secondary | ICD-10-CM | POA: Insufficient documentation

## 2023-09-16 DIAGNOSIS — E785 Hyperlipidemia, unspecified: Secondary | ICD-10-CM | POA: Diagnosis not present

## 2023-09-16 DIAGNOSIS — R0602 Shortness of breath: Secondary | ICD-10-CM | POA: Diagnosis not present

## 2023-09-16 DIAGNOSIS — E1122 Type 2 diabetes mellitus with diabetic chronic kidney disease: Secondary | ICD-10-CM | POA: Diagnosis not present

## 2023-09-16 DIAGNOSIS — Z79899 Other long term (current) drug therapy: Secondary | ICD-10-CM | POA: Diagnosis not present

## 2023-09-16 DIAGNOSIS — Z7182 Exercise counseling: Secondary | ICD-10-CM | POA: Diagnosis not present

## 2023-09-16 LAB — BASIC METABOLIC PANEL
Anion gap: 10 (ref 5–15)
BUN: 13 mg/dL (ref 8–23)
CO2: 24 mmol/L (ref 22–32)
Calcium: 9.1 mg/dL (ref 8.9–10.3)
Chloride: 103 mmol/L (ref 98–111)
Creatinine, Ser: 1.2 mg/dL (ref 0.61–1.24)
GFR, Estimated: 56 mL/min — ABNORMAL LOW (ref >60)
Glucose, Bld: 67 mg/dL — ABNORMAL LOW (ref 70–99)
Potassium: 3.5 mmol/L (ref 3.5–5.1)
Sodium: 137 mmol/L (ref 135–145)

## 2023-09-16 LAB — CBC
HCT: 38.9 % — ABNORMAL LOW (ref 39.0–52.0)
Hemoglobin: 13 g/dL (ref 13.0–17.0)
MCH: 32.1 pg (ref 26.0–34.0)
MCHC: 33.4 g/dL (ref 30.0–36.0)
MCV: 96 fL (ref 80.0–100.0)
Platelets: 149 10*3/uL — ABNORMAL LOW (ref 150–400)
RBC: 4.05 MIL/uL — ABNORMAL LOW (ref 4.22–5.81)
RDW: 12.9 % (ref 11.5–15.5)
WBC: 8.2 10*3/uL (ref 4.0–10.5)
nRBC: 0 % (ref 0.0–0.2)

## 2023-09-16 LAB — LIPID PANEL
Cholesterol: 111 mg/dL (ref 0–200)
HDL: 40 mg/dL — ABNORMAL LOW (ref >40)
LDL Cholesterol: 49 mg/dL (ref 0–99)
Total CHOL/HDL Ratio: 2.8 {ratio}
Triglycerides: 109 mg/dL (ref <150)
VLDL: 22 mg/dL (ref 0–40)

## 2023-09-16 LAB — BRAIN NATRIURETIC PEPTIDE: B Natriuretic Peptide: 266 pg/mL — ABNORMAL HIGH (ref 0.0–100.0)

## 2023-09-16 MED ORDER — MIDODRINE HCL 10 MG PO TABS: 10.0000 mg | ORAL_TABLET | Freq: Three times a day (TID) | ORAL | 3 refills | Status: DC

## 2023-09-16 NOTE — Progress Notes (Addendum)
ID:  Dylan [name], DOB Jan 08, 1926, MRN [id number]   Provider location: [facility] Type of Visit: Established patient  PCP:  Gaspar Garbe, MD  Cardiologist:  Marca Ancona, MD   History of Present Illness: Dylan [name] is a 89 y.o. with history of DM and HTN as well as atrial fibrillation and CHF.    Patient was doing well until 11/18 when he developed exertional dyspnea.  He went to the ER 07/14/17 and was found to be in atrial fibrillation with volume overload.  He was admitted overnight, diuresed and started on Eliquis, and discharged.  He remained in atrial fibrillation and short of breath.   He was re-hospitalized with dyspnea in 12/18.  TEE-guided DCCV to NSR was done and he was diuresed. TEE showed EF 50%.    He was noted to be back in atrial fibrillation and was started on amiodarone.  TEE-guided DCCV in 1/19 was done, with conversion back to NSR.   He was admitted in 2/19 with syncope and found to have tachy-brady syndrome with junctional bradycardia (symptomatic). Medtronic dual chamber PPM was placed.   He was readmitted in 2/19 with pneumonia.  During this admission, he was noted to be back in atrial fibrillation but converted back to NSR.   In 12/20, he developed COVID-19 pneumonitis and was hospitalized at [facility].  He developed orthostatic hypotension after COVID-19.  He was started on midodrine.   PYP scan in 9/21 was not suggestive of TTR cardiac amyloidosis.   Echo in 9/22 showed EF 55-60%, mild LVH (reviewed echo).   Patient was noted to be in atrial fibrillation in 1/23 after stopping amiodarone.  Amiodarone was restarted and he was cardioverted to NSR in 1/23.  However, he only stayed in NSR for about 2 days based on device interrogation and has been back in persistent atrial fibrillation. He had DCCV again to NSR in 2/23 but was back in AF a couple of days later.   He returns for followup of atrial fibrillation and  CHF.  Weight is up 3 lbs.  He has not been exercising as much and feels like he gets tired more easily.  Legs feel weak. He uses his walker. He generally does ok in the house but gets short of breath walking longer distances.  Will ride exercise bike at the [facility].  Lightheadedness with standing has been a bit worse recently and BP is low.  No falls since last appointment.    ECG (personally reviewed): atrial fibrillation with occasional RV pacing, inferior TWIs  Labs (12/20): hgb 12.9, TSH normal, BNP 72, LFTs normal, K 3.9, creatinine 0.88 Labs (2/21): TSH normal, hgb 13, LDL 52, K 3.8, creatinine 1.47, LFTs normal Labs (6/21): hgb 13.2, TSH normal, K 4, creatinine 1.19, LFTs normal Labs (9/21): Myeloma panel and urine immunofixation negative, LFTs normal, K 4.1, creatinine 1.37 Labs (8/22): hgb 13.2, TSH normal, K 4.5, creatinine 1.22, LFTs normal.  Labs (10/22): K 3.9, creatinine 1.25 Labs (1/23): K 3.6, creatinine 1.06, hgb 12.6 Labs (2/23): K 3.8, creatinine 1.25, hgb 13.1 Labs (3/23): K 4.2, creatinine 1.33 Labs (10/23): K 4, creatinine 1.06, hgb 13.2 Labs (12/23): K 3.8, creatinine 1.07, LDL 47, TGs 101 Labs (3/24): K 3.6, creatinine 1.18, hgb 14.1, BNP 279 Labs (7/24): K 3.5, creatinine 1.26, BNP 431, hgb 13.3 Labs (9/24): K 3.5, creatinine 0.89, BNP 296 Labs (10/24): K 3.7, creatinine 1.09, BNP 288  PMH: 1. Chronic diastolic CHF: Echo (11/18)  with EF 50-55%, mild-moderate LVH, mild AI, mild MR, normal RV size and systolic function, PASP 45 mmHg.  - TEE (12/18): EF 50%, normal RV size and systolic function, mild MR.  - TEE (1/19): EF 55-60%, mild LVH, mild MR, normal RV, aortic sclerosis without significant stenosis.  - PYP scan (9/21): grade 1, H/CL 1.03 => unlikely TTR cardiac amyloidosis.  - Echo (9/22): EF 55-60%, mild LVH (mild review) 2. Atrial fibrillation: Persistent, initially diagnosed in 11/18.  - TEE-guided DCCV in 12/18.  - TEE-guided DCCV in 1/19 on amiodarone.  -  DCCV 1/23 on amiodarone - DCCV 2/23  3. Chest pain: LHC remotely without obstructive disease.  1/16 Cardiolite with no ischemia.  4. Type II diabetes 5. HTN 6. CKD: Stage 3.  7. Tachy-brady syndrome: Medtronic dual chamber.   8. Orthostatic hypotension.  9. COVID-19 pneumonitis 10. ABIs (6/19): Normal 11. Spinal stenosis 12. Carpal tunnel syndrome  Social History   Socioeconomic History   Marital status: Married    Spouse name: Not on file   Number of children: Not on file   Years of education: Not on file   Highest education level: Not on file  Occupational History   Not on file  Tobacco Use   Smoking status: Former    Current packs/day: 0.00    Types: Cigarettes    Quit date: 01/07/1992    Years since quitting: 31.7   Smokeless tobacco: Never  Substance and Sexual Activity   Alcohol use: Not Currently    Alcohol/week: 2.0 standard drinks of alcohol    Types: 2 Glasses of wine per week   Drug use: No   Sexual activity: Yes  Other Topics Concern   Not on file  Social History Narrative   Not on file   Social Drivers of Health   Financial Resource Strain: Not on file  Food Insecurity: Not on file  Transportation Needs: Not on file  Physical Activity: Not on file  Stress: Not on file  Social Connections: Not on file  Intimate Partner Violence: Not on file   Family History  Problem Relation Age of Onset   Cancer - Other Mother    CVA Father    Heart attack Brother    Heart attack Maternal Grandmother    ROS: All systems reviewed and negative except as per HPI.  Current Outpatient Medications  Medication Sig Dispense Refill   Acetaminophen (TYLENOL PO) Take 650 mg by mouth in the morning.     acetaminophen (TYLENOL) 500 MG tablet Take 1,000 mg by mouth every 8 (eight) hours as needed for mild pain or headache.     apixaban (ELIQUIS) 5 MG TABS tablet Take 1 tablet by mouth twice daily 60 tablet 11   Cholecalciferol (VITAMIN D3) 50 MCG (2000 UT) TABS Take  2,000 Units by mouth daily.     docusate sodium (COLACE) 100 MG capsule Patient takes 1 capsule by mouth every other day.     furosemide (LASIX) 40 MG tablet Take 1 tablet (40 mg total) by mouth 2 (two) times daily. 180 tablet 1   Menthol, Topical Analgesic, (BIOFREEZE ROLL-ON COLORLESS) 4 % GEL Apply 1 application topically daily as needed (pain).     metoprolol succinate (TOPROL-XL) 25 MG 24 hr tablet Take 1 tablet by mouth once daily 90 tablet 3   Omega-3 Fatty Acids (FISH OIL) 1200 MG CAPS Take 1,200 mg by mouth daily.      potassium chloride SA (KLOR-CON M) 20 MEQ tablet Take  1 tablet by mouth once daily 90 tablet 3   rosuvastatin (CRESTOR) 10 MG tablet Take 1 tablet by mouth once daily 90 tablet 3   vitamin B-12 (CYANOCOBALAMIN) 1000 MCG tablet Take 1,000 mcg by mouth daily.     midodrine (PROAMATINE) 10 MG tablet Take 1 tablet (10 mg total) by mouth 3 (three) times daily. 90 tablet 3   No current facility-administered medications for this encounter.   BP (!) 90/50   Pulse 67   Wt 107.3 kg (236 lb 9.6 oz)   SpO2 96%   BMI 32.09 kg/m  General: NAD Neck: No JVD, no thyromegaly or thyroid nodule.  Lungs: Clear to auscultation bilaterally with normal respiratory effort. CV: Nondisplaced PMI.  Heart irregular S1/S2, no S3/S4, no murmur.  No peripheral edema.  No carotid bruit.  Normal pedal pulses.  Abdomen: Soft, nontender, no hepatosplenomegaly, no distention.  Skin: Intact without lesions or rashes.  Neurologic: Alert and oriented x 3.  Psych: Normal affect. Extremities: No clubbing or cyanosis.  HEENT: Normal.   Assessment/Plan: 1. Atrial fibrillation: DCCV in 12/18.  Recurrence of atrial fibrillation with initiation of amiodarone then TEE-guided DCCV in 1/19.  Amiodarone stopped fall of 2022, back in AF in 12/22.  Amiodarone restarted and he had DCCV to NSR in 1/23 but atrial fibrillation recurred < 2 days despite amiodarone.  DCCV again in 2/23 with recurrence of AF again.  He  remains in atrial fibrillation today. Rate is controlled, he does not feel palpitations.  - He has failed amiodarone at this point and we will not re-attempt DCCV.  - He will continue Toprol XL 25 mg daily for rate control.     - Continue apixaban, CBC today.  2. CKD: BMET today.   3. Chronic diastolic CHF: EF 16-10% from 1/19 TEE.  Prior workup negative for cardiac amyloidosis.  Echo in 9/22 with mild LVH, EF 55-60% (my review).  Mildly progressive NYHA class III symptoms.  He is not volume overloaded on exam today.  - Continue Lasix 40 mg bid. BMET/BNP today.  - Continue KDur 20 mEq daily.  4. Orthostatic hypotension: This has been present since his COVID-19 infection and may be related.  Some worsening recently.   - Increase midodrine to 10 mg tid.  Needs to avoid falls.  - Continue to wear compression stockings.  5. Hyperlipidemia: Check lipids today.  6. Deconditioning: He has backed off on exercise this winter and is getting fatigued more easily.  - Increase exercise, need to go to [facility].   Followup in 3 months    Signed, Marca Ancona, MD  09/16/2023  [facility] [address] Heart and Vascular Center [place] [address] [phone] (office) [phone] (fax)

## 2023-09-16 NOTE — Patient Instructions (Signed)
INCREASE Midodrine to 10 mg Three times a day  Labs done today, your results will be available in MyChart, we will contact you for abnormal readings.  Your physician recommends that you schedule a follow-up appointment in: 3 months (April) ** PLEASE CALL THE OFFICE IN MID FEBRUARY/ EARLY MARCH TO ARRANGE YOUR FOLLOW UP APPOINTMENT.**  If you have any questions or concerns before your next appointment please send [place] a message through [place] or call our office at [phone].    TO LEAVE A MESSAGE FOR THE NURSE SELECT OPTION 2, PLEASE LEAVE A MESSAGE INCLUDING: YOUR NAME DATE OF BIRTH CALL BACK NUMBER REASON FOR CALL**this is important as we prioritize the call backs  YOU WILL RECEIVE A CALL BACK THE SAME DAY AS LONG AS YOU CALL BEFORE 4:00 PM  At the [facility], you and your health needs are our priority. As part of our continuing mission to provide you with exceptional heart care, we have created designated Provider Care Teams. These Care Teams include your primary Cardiologist (physician) and Advanced Practice Providers (APPs- Physician Assistants and Nurse Practitioners) who all work together to provide you with the care you need, when you need it.   You may see any of the following providers on your designated Care Team at your next follow up: Dr Arvilla Meres Dr Marca Ancona Dr. Dorthula Nettles Dr. Clearnce Hasten Amy Filbert Schilder, NP Robbie Lis, [place] [facility] [place], [place] Brynda Peon, NP [place] Lee, NP Karle Plumber, PharmD   Please be sure to bring in all your medications bottles to every appointment.    Thank you for choosing [facility]

## 2023-09-16 NOTE — Progress Notes (Signed)
Medication Samples have been provided to the patient.  Drug name: Eliquis       Strength: 5 mg        Qty: 4  LOT: [id number]  Exp.Date: 10/2024  Dosing instructions: Take 1 tablet Twice daily   The patient has been instructed regarding the correct time, dose, and frequency of taking this medication, including desired effects and most common side effects.   Dylan [name] 10:11 AM 09/16/2023

## 2023-09-19 ENCOUNTER — Other Ambulatory Visit (HOSPITAL_COMMUNITY): Payer: Self-pay | Admitting: Cardiology

## 2023-09-26 ENCOUNTER — Ambulatory Visit (INDEPENDENT_AMBULATORY_CARE_PROVIDER_SITE_OTHER): Payer: Medicare HMO

## 2023-09-26 DIAGNOSIS — I495 Sick sinus syndrome: Secondary | ICD-10-CM

## 2023-09-26 LAB — CUP PACEART REMOTE DEVICE CHECK
Battery Remaining Longevity: 65 mo
Battery Voltage: 2.96 V
Brady Statistic RA Percent Paced: 0 %
Brady Statistic RV Percent Paced: 41.56 %
Date Time Interrogation Session: 20250205230629
Implantable Lead Connection Status: 753985
Implantable Lead Connection Status: 753985
Implantable Lead Implant Date: 20190216
Implantable Lead Implant Date: 20190216
Implantable Lead Location: 753859
Implantable Lead Location: 753860
Implantable Lead Model: 5076
Implantable Lead Model: 5076
Implantable Pulse Generator Implant Date: 20190216
Lead Channel Impedance Value: 304 Ohm
Lead Channel Impedance Value: 323 Ohm
Lead Channel Impedance Value: 380 Ohm
Lead Channel Impedance Value: 437 Ohm
Lead Channel Pacing Threshold Amplitude: 0.5 V
Lead Channel Pacing Threshold Amplitude: 0.75 V
Lead Channel Pacing Threshold Pulse Width: 0.4 ms
Lead Channel Pacing Threshold Pulse Width: 0.4 ms
Lead Channel Sensing Intrinsic Amplitude: 1.125 mV
Lead Channel Sensing Intrinsic Amplitude: 1.125 mV
Lead Channel Sensing Intrinsic Amplitude: 4.25 mV
Lead Channel Sensing Intrinsic Amplitude: 4.25 mV
Lead Channel Setting Pacing Amplitude: 2 V
Lead Channel Setting Pacing Pulse Width: 0.4 ms
Lead Channel Setting Sensing Sensitivity: 1.2 mV
Zone Setting Status: 755011
Zone Setting Status: 755011

## 2023-10-10 ENCOUNTER — Encounter (HOSPITAL_COMMUNITY): Payer: Self-pay | Admitting: Cardiology

## 2023-10-10 ENCOUNTER — Ambulatory Visit (HOSPITAL_COMMUNITY)
Admission: RE | Admit: 2023-10-10 | Discharge: 2023-10-10 | Disposition: A | Discharge status: Home / Self Care | Payer: Medicare HMO | Source: Ambulatory Visit | Attending: Cardiology | Admitting: Cardiology

## 2023-10-10 VITALS — BP 114/86 | HR 72 | Wt 236.2 lb

## 2023-10-10 DIAGNOSIS — I5032 Chronic diastolic (congestive) heart failure: Secondary | ICD-10-CM | POA: Insufficient documentation

## 2023-10-10 LAB — BASIC METABOLIC PANEL
Anion gap: 8 (ref 5–15)
BUN: 16 mg/dL (ref 8–23)
CO2: 25 mmol/L (ref 22–32)
Calcium: 9.2 mg/dL (ref 8.9–10.3)
Chloride: 104 mmol/L (ref 98–111)
Creatinine, Ser: 1.04 mg/dL (ref 0.61–1.24)
GFR, Estimated: >60 mL/min (ref >60)
Glucose, Bld: 106 mg/dL — ABNORMAL HIGH (ref 70–99)
Potassium: 4 mmol/L (ref 3.5–5.1)
Sodium: 137 mmol/L (ref 135–145)

## 2023-10-10 LAB — BRAIN NATRIURETIC PEPTIDE: B Natriuretic Peptide: 359.2 pg/mL — ABNORMAL HIGH (ref 0.0–100.0)

## 2023-10-10 MED ORDER — FUROSEMIDE 40 MG PO TABS: ORAL_TABLET | ORAL | 1 refills | Status: DC

## 2023-10-10 NOTE — Patient Instructions (Signed)
 CHANGE Lasix to 60 mg in the morning and 40 mg in the morning.  Labs done today, your results will be available in MyChart, we will contact you for abnormal readings.  Repeat blood work in 10 days.  You have been referred to Pulmonology. They will call you to arrange your appointment.  Your physician recommends that you schedule a follow-up appointment in: 3 months (May) ** PLEASE CALL THE OFFICE IN MARCH TO ARRANGE YOUR FOLLOW UP APPOINTMENT.**  If you have any questions or concerns before your next appointment please send [place] a message through [place] or call our office at [phone].    TO LEAVE A MESSAGE FOR THE NURSE SELECT OPTION 2, PLEASE LEAVE A MESSAGE INCLUDING: YOUR NAME DATE OF BIRTH CALL BACK NUMBER REASON FOR CALL**this is important as we prioritize the call backs  YOU WILL RECEIVE A CALL BACK THE SAME DAY AS LONG AS YOU CALL BEFORE 4:00 PM  At the [facility], you and your health needs are our priority. As part of our continuing mission to provide you with exceptional heart care, we have created designated Provider Care Teams. These Care Teams include your primary Cardiologist (physician) and Advanced Practice Providers (APPs- Physician Assistants and Nurse Practitioners) who all work together to provide you with the care you need, when you need it.   You may see any of the following providers on your designated Care Team at your next follow up: Dr Arvilla Meres Dr Marca Ancona Dr. Dorthula Nettles Dr. Clearnce Hasten Amy Filbert Schilder, NP Robbie Lis, [place] [facility] [place], [place] Brynda Peon, NP [place] Lee, NP Clarisa Kindred, NP Karle Plumber, PharmD Enos Fling, PharmD   Please be sure to bring in all your medications bottles to every appointment.    Thank you for choosing [facility]

## 2023-10-10 NOTE — Progress Notes (Signed)
 ReDS Vest / Clip - 10/10/23 1500       ReDS Vest / Clip   Station Marker D    Ruler Value 32    ReDS Value Range Low volume    ReDS Actual Value 35    Anatomical Comments sitting

## 2023-10-10 NOTE — Progress Notes (Signed)
 Medication Samples have been provided to the patient.  Drug name: Eliquis       Strength: 5mg         Qty: 4  LOT: [id number]  Exp.Date: 01/2025  Dosing instructions: take 1 tab po bid  The patient has been instructed regarding the correct time, dose, and frequency of taking this medication, including desired effects and most common side effects.   Dylan [name] 3:07 PM 10/10/2023

## 2023-10-10 NOTE — Progress Notes (Addendum)
 Patient ambulated in hall on room air. O2 sat started at 93% and then decreased to 87%

## 2023-10-11 ENCOUNTER — Telehealth (HOSPITAL_COMMUNITY): Payer: Self-pay

## 2023-10-11 NOTE — Telephone Encounter (Signed)
 Order for Oxygen faxed to [facility] on 10/10/23

## 2023-10-13 NOTE — Progress Notes (Signed)
 ID:  [name], DOB July 14, 1926, MRN [id number]   Provider location: [facility] Type of Visit: Established patient  PCP:  Gaspar Garbe, MD  Cardiologist:  Marca Ancona, MD  Chief Complaint: CHF   History of Present Illness: Dylan [name] is a 89 y.o. with history of DM and HTN as well as atrial fibrillation and CHF.    Patient was doing well until 11/18 when he developed exertional dyspnea.  He went to the ER 07/14/17 and was found to be in atrial fibrillation with volume overload.  He was admitted overnight, diuresed and started on Eliquis, and discharged.  He remained in atrial fibrillation and short of breath.   He was re-hospitalized with dyspnea in 12/18.  TEE-guided DCCV to NSR was done and he was diuresed. TEE showed EF 50%.    He was noted to be back in atrial fibrillation and was started on amiodarone.  TEE-guided DCCV in 1/19 was done, with conversion back to NSR.   He was admitted in 2/19 with syncope and found to have tachy-brady syndrome with junctional bradycardia (symptomatic). Medtronic dual chamber PPM was placed.   He was readmitted in 2/19 with pneumonia.  During this admission, he was noted to be back in atrial fibrillation but converted back to NSR.   In 12/20, he developed COVID-19 pneumonitis and was hospitalized at [facility].  He developed orthostatic hypotension after COVID-19.  He was started on midodrine.   PYP scan in 9/21 was not suggestive of TTR cardiac amyloidosis.   Echo in 9/22 showed EF 55-60%, mild LVH (reviewed echo).   Patient was noted to be in atrial fibrillation in 1/23 after stopping amiodarone.  Amiodarone was restarted and he was cardioverted to NSR in 1/23.  However, he only stayed in NSR for about 2 days based on device interrogation and has been back in persistent atrial fibrillation. He had DCCV again to NSR in 2/23 but was back in AF a couple of days later.   He returns for followup of  atrial fibrillation and CHF.  Weight is unchanged.  Per family, he is getting more short of breath.  He is quite dyspneic walking to the car.  Generally still does ok around the house walking with his walker.  Oxygen saturation dropped to 87% today when we walked him in the office on room air, good oxygen saturation at rest.  He denies orthopnea/PND. He gets tired and falls asleep very easily during the day.  Taking midodrine regularly, no lightheadedness. He is going to be moving to assisted living. Still has personal trainer several times/week.   REDS clip 35%  Labs (12/20): hgb 12.9, TSH normal, BNP 72, LFTs normal, K 3.9, creatinine 0.88 Labs (2/21): TSH normal, hgb 13, LDL 52, K 3.8, creatinine 9.52, LFTs normal Labs (6/21): hgb 13.2, TSH normal, K 4, creatinine 1.19, LFTs normal Labs (9/21): Myeloma panel and urine immunofixation negative, LFTs normal, K 4.1, creatinine 1.37 Labs (8/22): hgb 13.2, TSH normal, K 4.5, creatinine 1.22, LFTs normal.  Labs (10/22): K 3.9, creatinine 1.25 Labs (1/23): K 3.6, creatinine 1.06, hgb 12.6 Labs (2/23): K 3.8, creatinine 1.25, hgb 13.1 Labs (3/23): K 4.2, creatinine 1.33 Labs (10/23): K 4, creatinine 1.06, hgb 13.2 Labs (12/23): K 3.8, creatinine 1.07, LDL 47, TGs 101 Labs (3/24): K 3.6, creatinine 1.18, hgb 14.1, BNP 279 Labs (7/24): K 3.5, creatinine 1.26, BNP 431, hgb 13.3 Labs (9/24): K 3.5, creatinine 0.89, BNP 296 Labs (  10/24): K 3.7, creatinine 1.09, BNP 288 Labs (1/25): K 3.5, creatinine 1.2, BNP 266, LDL 49  PMH: 1. Chronic diastolic CHF: Echo (11/18) with EF 50-55%, mild-moderate LVH, mild AI, mild MR, normal RV size and systolic function, PASP 45 mmHg.  - TEE (12/18): EF 50%, normal RV size and systolic function, mild MR.  - TEE (1/19): EF 55-60%, mild LVH, mild MR, normal RV, aortic sclerosis without significant stenosis.  - PYP scan (9/21): grade 1, H/CL 1.03 => unlikely TTR cardiac amyloidosis.  - Echo (9/22): EF 55-60%, mild LVH  (mild review) 2. Atrial fibrillation: Persistent, initially diagnosed in 11/18.  - TEE-guided DCCV in 12/18.  - TEE-guided DCCV in 1/19 on amiodarone.  - DCCV 1/23 on amiodarone - DCCV 2/23  3. Chest pain: LHC remotely without obstructive disease.  1/16 Cardiolite with no ischemia.  4. Type II diabetes 5. HTN 6. CKD: Stage 3.  7. Tachy-brady syndrome: Medtronic dual chamber.   8. Orthostatic hypotension.  9. COVID-19 pneumonitis 10. ABIs (6/19): Normal 11. Spinal stenosis 12. Carpal tunnel syndrome  Social History   Socioeconomic History   Marital status: Married    Spouse name: Not on file   Number of children: Not on file   Years of education: Not on file   Highest education level: Not on file  Occupational History   Not on file  Tobacco Use   Smoking status: Former    Current packs/day: 0.00    Types: Cigarettes    Quit date: 01/07/1992    Years since quitting: 31.7   Smokeless tobacco: Never  Substance and Sexual Activity   Alcohol use: Not Currently    Alcohol/week: 2.0 standard drinks of alcohol    Types: 2 Glasses of wine per week   Drug use: No   Sexual activity: Yes  Other Topics Concern   Not on file  Social History Narrative   Not on file   Social Drivers of Health   Financial Resource Strain: Not on file  Food Insecurity: Not on file  Transportation Needs: Not on file  Physical Activity: Not on file  Stress: Not on file  Social Connections: Not on file  Intimate Partner Violence: Not on file   Family History  Problem Relation Age of Onset   Cancer - Other Mother    CVA Father    Heart attack Brother    Heart attack Maternal Grandmother    ROS: All systems reviewed and negative except as per HPI.  Current Outpatient Medications  Medication Sig Dispense Refill   Acetaminophen (TYLENOL PO) Take 650 mg by mouth in the morning.     acetaminophen (TYLENOL) 500 MG tablet Take 1,000 mg by mouth every 8 (eight) hours as needed for mild pain or  headache.     apixaban (ELIQUIS) 5 MG TABS tablet Take 1 tablet by mouth twice daily 60 tablet 11   Cholecalciferol (VITAMIN D3) 50 MCG (2000 UT) TABS Take 2,000 Units by mouth daily.     docusate sodium (COLACE) 100 MG capsule Patient takes 1 capsule by mouth every other day.     Menthol, Topical Analgesic, (BIOFREEZE ROLL-ON COLORLESS) 4 % GEL Apply 1 application topically daily as needed (pain).     metoprolol succinate (TOPROL-XL) 25 MG 24 hr tablet Take 1 tablet by mouth once daily 90 tablet 3   midodrine (PROAMATINE) 10 MG tablet Take 1 tablet (10 mg total) by mouth 3 (three) times daily. 90 tablet 3   Omega-3 Fatty Acids (  FISH OIL) 1200 MG CAPS Take 1,200 mg by mouth daily.      potassium chloride SA (KLOR-CON M) 20 MEQ tablet Take 1 tablet by mouth once daily 90 tablet 3   rosuvastatin (CRESTOR) 10 MG tablet Take 1 tablet by mouth once daily 90 tablet 3   vitamin B-12 (CYANOCOBALAMIN) 1000 MCG tablet Take 1,000 mcg by mouth daily.     furosemide (LASIX) 40 MG tablet Take 1.5 tablets (60 mg total) by mouth in the morning AND 1 tablet (40 mg total) every evening. 180 tablet 1   No current facility-administered medications for this encounter.   BP 114/86   Pulse 72   Wt 107.1 kg (236 lb 3.2 oz)   SpO2 92%   BMI 32.03 kg/m  General: NAD Neck: JVP 8 cm, no thyromegaly or thyroid nodule.  Lungs: Mild crackles at bases.  CV: Nondisplaced PMI.  Heart regular S1/S2, no S3/S4, no murmur.  1+ ankle edema.  No carotid bruit.  Normal pedal pulses.  Abdomen: Soft, nontender, no hepatosplenomegaly, no distention.  Skin: Intact without lesions or rashes.  Neurologic: Alert and oriented x 3.  Psych: Normal affect. Extremities: No clubbing or cyanosis.  HEENT: Normal.   Assessment/Plan: 1. Atrial fibrillation: DCCV in 12/18.  Recurrence of atrial fibrillation with initiation of amiodarone then TEE-guided DCCV in 1/19.  Amiodarone stopped fall of 2022, back in AF in 12/22.  Amiodarone restarted  and he had DCCV to NSR in 1/23 but atrial fibrillation recurred < 2 days despite amiodarone.  DCCV again in 2/23 with recurrence of AF again.  He remains in atrial fibrillation today. Rate is controlled, he does not feel palpitations.  - He has failed amiodarone at this point and we will not re-attempt DCCV.  - He will continue Toprol XL 25 mg daily for rate control.     - Continue apixaban 2. CKD: BMET today.   3. Chronic diastolic CHF: EF 29-52% from 1/19 TEE.  Prior workup negative for cardiac amyloidosis.  Echo in 9/22 with mild LVH, EF 55-60%.  Progressive NYHA class III symptoms.  Mild volume overload by exam and REDS clip.  - Increase Lasix to 60 qam/40 qpm. BMET/BNP today, BMET 10 days.  - Continue KDur 20 mEq daily.  - Oxygen saturation drops with ambulation, will arrange for oxygen with ambulation.  4. Orthostatic hypotension: This has been present since his COVID-19 infection and may be related.   - Continue midodrine 10 mg tid.  Needs to avoid falls.  - Continue to wear compression stockings.  5. Hyperlipidemia: Good lipids in 1/25.  6. Suspect OSA: I will arrange for sleep study.  If he has OSA, CPAP may help with his significant daytime sleepiness.   Followup in 3 months    I spent 32 minutes reviewing data, interviewing patient, and organizing the orders/followup.   Signed, Marca Ancona, MD  10/13/2023  [facility] [address] [facility] [place] [address] [phone] (office) [phone] (fax)

## 2023-10-15 DIAGNOSIS — I5032 Chronic diastolic (congestive) heart failure: Secondary | ICD-10-CM | POA: Diagnosis not present

## 2023-10-15 DIAGNOSIS — Z022 Encounter for examination for admission to residential institution: Secondary | ICD-10-CM | POA: Diagnosis not present

## 2023-10-15 DIAGNOSIS — I480 Paroxysmal atrial fibrillation: Secondary | ICD-10-CM | POA: Diagnosis not present

## 2023-10-15 DIAGNOSIS — Z7689 Persons encountering health services in other specified circumstances: Secondary | ICD-10-CM | POA: Diagnosis not present

## 2023-10-15 DIAGNOSIS — M160 Bilateral primary osteoarthritis of hip: Secondary | ICD-10-CM | POA: Diagnosis not present

## 2023-10-15 DIAGNOSIS — Z7189 Other specified counseling: Secondary | ICD-10-CM | POA: Diagnosis not present

## 2023-10-15 DIAGNOSIS — E7800 Pure hypercholesterolemia, unspecified: Secondary | ICD-10-CM | POA: Diagnosis not present

## 2023-10-21 ENCOUNTER — Encounter: Payer: Self-pay | Admitting: Internal Medicine

## 2023-10-22 ENCOUNTER — Ambulatory Visit (HOSPITAL_COMMUNITY)
Admission: RE | Admit: 2023-10-22 | Discharge: 2023-10-22 | Disposition: A | Discharge status: Home / Self Care | Payer: Medicare HMO | Source: Ambulatory Visit | Attending: Internal Medicine | Admitting: Internal Medicine

## 2023-10-22 DIAGNOSIS — I5032 Chronic diastolic (congestive) heart failure: Secondary | ICD-10-CM | POA: Insufficient documentation

## 2023-10-22 LAB — BASIC METABOLIC PANEL
Anion gap: 18 — ABNORMAL HIGH (ref 5–15)
BUN: 18 mg/dL (ref 8–23)
CO2: 23 mmol/L (ref 22–32)
Calcium: 9.9 mg/dL (ref 8.9–10.3)
Chloride: 98 mmol/L (ref 98–111)
Creatinine, Ser: 1.31 mg/dL — ABNORMAL HIGH (ref 0.61–1.24)
GFR, Estimated: 50 mL/min — ABNORMAL LOW (ref >60)
Glucose, Bld: 94 mg/dL (ref 70–99)
Potassium: 3.7 mmol/L (ref 3.5–5.1)
Sodium: 139 mmol/L (ref 135–145)

## 2023-10-31 NOTE — Progress Notes (Signed)
 Remote pacemaker transmission.

## 2023-11-05 ENCOUNTER — Telehealth (HOSPITAL_COMMUNITY): Payer: Self-pay | Admitting: Cardiology

## 2023-11-05 NOTE — Telephone Encounter (Signed)
 Patients son called to report decrease in O2 sats without o2   Reports during SNF OT appointment/evaluation o2 sats dropped into the 70's, during shower 76% room air, and walked to the bathroom 74% room air.  Pt wears O2 at 2L daily. Only ordered to take off at night to rest.  Son wanted to make HF providers aware in the event changes to o2 orders are needed, follow up for further evaluation is needed etc.   Pt is otherwise asymptomatic Overall fatigued-chronic afib   Please advise

## 2023-11-07 ENCOUNTER — Telehealth: Payer: Self-pay | Admitting: Home Health

## 2023-11-07 ENCOUNTER — Encounter (HOSPITAL_COMMUNITY): Payer: Self-pay

## 2023-11-07 ENCOUNTER — Ambulatory Visit (HOSPITAL_COMMUNITY)
Admission: RE | Admit: 2023-11-07 | Discharge: 2023-11-07 | Disposition: A | Discharge status: Home / Self Care | Source: Ambulatory Visit | Attending: Physician Assistant | Admitting: Physician Assistant

## 2023-11-07 VITALS — BP 124/80 | HR 65 | Ht 72.0 in | Wt 230.4 lb

## 2023-11-07 DIAGNOSIS — I482 Chronic atrial fibrillation, unspecified: Secondary | ICD-10-CM

## 2023-11-07 DIAGNOSIS — Z7901 Long term (current) use of anticoagulants: Secondary | ICD-10-CM | POA: Diagnosis not present

## 2023-11-07 DIAGNOSIS — I5032 Chronic diastolic (congestive) heart failure: Secondary | ICD-10-CM | POA: Insufficient documentation

## 2023-11-07 DIAGNOSIS — N1831 Chronic kidney disease, stage 3a: Secondary | ICD-10-CM | POA: Insufficient documentation

## 2023-11-07 DIAGNOSIS — E1122 Type 2 diabetes mellitus with diabetic chronic kidney disease: Secondary | ICD-10-CM | POA: Diagnosis not present

## 2023-11-07 DIAGNOSIS — I951 Orthostatic hypotension: Secondary | ICD-10-CM | POA: Diagnosis not present

## 2023-11-07 DIAGNOSIS — I10 Essential (primary) hypertension: Secondary | ICD-10-CM | POA: Diagnosis not present

## 2023-11-07 DIAGNOSIS — E785 Hyperlipidemia, unspecified: Secondary | ICD-10-CM | POA: Diagnosis not present

## 2023-11-07 DIAGNOSIS — E119 Type 2 diabetes mellitus without complications: Secondary | ICD-10-CM | POA: Diagnosis not present

## 2023-11-07 DIAGNOSIS — I130 Hypertensive heart and chronic kidney disease with heart failure and stage 1 through stage 4 chronic kidney disease, or unspecified chronic kidney disease: Secondary | ICD-10-CM | POA: Diagnosis not present

## 2023-11-07 DIAGNOSIS — I4819 Other persistent atrial fibrillation: Secondary | ICD-10-CM | POA: Diagnosis not present

## 2023-11-07 LAB — BASIC METABOLIC PANEL
Anion gap: 12 (ref 5–15)
BUN: 15 mg/dL (ref 8–23)
CO2: 24 mmol/L (ref 22–32)
Calcium: 8.8 mg/dL — ABNORMAL LOW (ref 8.9–10.3)
Chloride: 99 mmol/L (ref 98–111)
Creatinine, Ser: 1.16 mg/dL (ref 0.61–1.24)
GFR, Estimated: 58 mL/min — ABNORMAL LOW (ref >60)
Glucose, Bld: 114 mg/dL — ABNORMAL HIGH (ref 70–99)
Potassium: 2.9 mmol/L — ABNORMAL LOW (ref 3.5–5.1)
Sodium: 135 mmol/L (ref 135–145)

## 2023-11-07 MED ORDER — MIDODRINE HCL 10 MG PO TABS: 15.0000 mg | ORAL_TABLET | Freq: Three times a day (TID) | ORAL | 3 refills | Status: DC

## 2023-11-07 NOTE — Progress Notes (Addendum)
 PCP:  Gaspar Garbe, MD  Cardiologist:  Marca Ancona, MD  Chief Complaint: orthostatic hypotension   History of Present Illness: Dylan [name] is a 89 y.o. with history of DM and HTN as well as atrial fibrillation and CHF.    Patient was doing well until 11/18 when he developed exertional dyspnea.  He went to the ER 07/14/17 and was found to be in atrial fibrillation with volume overload.  He was admitted overnight, diuresed and started on Eliquis, and discharged.  He remained in atrial fibrillation and short of breath.   He was re-hospitalized with dyspnea in 12/18.  TEE-guided DCCV to NSR was done and he was diuresed. TEE showed EF 50%.    He was noted to be back in atrial fibrillation and was started on amiodarone.  TEE-guided DCCV in 1/19 was done, with conversion back to NSR.   He was admitted in 2/19 with syncope and found to have tachy-brady syndrome with junctional bradycardia (symptomatic). Medtronic dual chamber PPM was placed.   He was readmitted in 2/19 with pneumonia.  During this admission, he was noted to be back in atrial fibrillation but converted back to NSR.   In 12/20, he developed COVID-19 pneumonitis and was hospitalized at [facility].  He developed orthostatic hypotension after COVID-19.  He was started on midodrine.   PYP scan in 9/21 was not suggestive of TTR cardiac amyloidosis.   Echo in 9/22 showed EF 55-60%, mild LVH (reviewed echo).   Patient was noted to be in atrial fibrillation in 1/23 after stopping amiodarone.  Amiodarone was restarted and he was cardioverted to NSR in 1/23.  However, he only stayed in NSR for about 2 days based on device interrogation and has been back in persistent atrial fibrillation. He had DCCV again to NSR in 2/23 but was back in AF a couple of days later.   Seen for follow-up last month. At that time family reported more shortness of breath. O2 sats dropped to 87% with ambulation in clinic. Appeared volume up  and lasix was increased. Home O2 arranged. Noting a lot of fatigue and set up for sleep study, opted not to complete d/t age.   Here today for urgent visit to discuss orthostatic hypotension. He is accompanied by his son who assists with the history. Recently moved into ALF about 10 days ago. He's had several falls since then. He stands up quickly to walk and gets lightheaded then loses his balance. Has peripheral neuropathy with poor sensation in his feet. He's had positive orthostatics when checked by PT/OT. Also working with his son's Systems analyst every week. Does some weight training/resistance exercises. Noted to have some desaturations to mid/upper 80s with exertion. Using 2L O2 with activity and during day if needed. Wearing compression stockings regularly. Drinks fluids, clamato juice, coffee, soda, tea but doesn't care for water.     Social History   Socioeconomic History   Marital status: Married    Spouse name: Not on file   Number of children: Not on file   Years of education: Not on file   Highest education level: Not on file  Occupational History   Not on file  Tobacco Use   Smoking status: Former    Current packs/day: 0.00    Types: Cigarettes    Quit date: 01/07/1992    Years since quitting: 31.8   Smokeless tobacco: Never  Substance and Sexual Activity   Alcohol use: Not Currently    Alcohol/week: 2.0  standard drinks of alcohol    Types: 2 Glasses of wine per week   Drug use: No   Sexual activity: Yes  Other Topics Concern   Not on file  Social History Narrative   Not on file   Social Drivers of Health   Financial Resource Strain: Not on file  Food Insecurity: Not on file  Transportation Needs: Not on file  Physical Activity: Not on file  Stress: Not on file  Social Connections: Not on file  Intimate Partner Violence: Not on file   Family History  Problem Relation Age of Onset   Cancer - Other Mother    CVA Father    Heart attack Brother    Heart  attack Maternal Grandmother    ROS: All systems reviewed and negative except as per HPI.  Current Outpatient Medications  Medication Sig Dispense Refill   Acetaminophen (TYLENOL PO) Take 650 mg by mouth in the morning.     acetaminophen (TYLENOL) 500 MG tablet Take 1,000 mg by mouth every 8 (eight) hours as needed for mild pain or headache.     apixaban (ELIQUIS) 5 MG TABS tablet Take 1 tablet by mouth twice daily 60 tablet 11   Cholecalciferol (VITAMIN D3) 50 MCG (2000 UT) TABS Take 2,000 Units by mouth daily.     docusate sodium (COLACE) 100 MG capsule Patient takes 1 capsule by mouth every other day.     furosemide (LASIX) 40 MG tablet Take 1.5 tablets (60 mg total) by mouth in the morning AND 1 tablet (40 mg total) every evening. 180 tablet 1   Menthol, Topical Analgesic, (BIOFREEZE ROLL-ON COLORLESS) 4 % GEL Apply 1 application topically daily as needed (pain).     metoprolol succinate (TOPROL-XL) 25 MG 24 hr tablet Take 1 tablet by mouth once daily 90 tablet 3   midodrine (PROAMATINE) 10 MG tablet Take 1.5 tablets (15 mg total) by mouth 3 (three) times daily. 135 tablet 3   Omega-3 Fatty Acids (FISH OIL) 1200 MG CAPS Take 1,200 mg by mouth daily.      potassium chloride SA (KLOR-CON M) 20 MEQ tablet Take 1 tablet by mouth once daily 90 tablet 3   rosuvastatin (CRESTOR) 10 MG tablet Take 1 tablet by mouth once daily 90 tablet 3   vitamin B-12 (CYANOCOBALAMIN) 1000 MCG tablet Take 1,000 mcg by mouth daily.     No current facility-administered medications for this encounter.   BP 124/80   Pulse 65   Ht 6' (1.829 m)   Wt 104.5 kg (230 lb 6.4 oz)   SpO2 95%   BMI 31.25 kg/m    General:  Well appearing elderly male Neck: Jvp ~ 7 cm Cor: irregular rate and rhythm. No rubs, gallops or murmurs. Lungs: clear Abdomen: soft, nontender, nondistended. Extremities: 1+ edema, + TED hose Neuro: alert & orientedx3. Affect pleasant   Assessment/Plan: 1. Orthostatic hypotension: This has  been present since his COVID-19 infection and may be related.   - + orthostatics in clinic today, 30 mmHg drop in systolic pressure  (120>90) from seated to standing position - Increase midodrine to 15 TID - Check BMET today. If evidence of volume depletion will cut back diuretic. - Continue to wear compression stockings.   2. Chronic diastolic CHF: EF 95-63% from 1/19 TEE.  Prior workup negative for cardiac amyloidosis.  Echo in 9/22 with mild LVH, EF 55-60%.  NYHA III. Volume okay by exam and ReDS. ReDS 34%.  - Continue Lasix 60 mEq  AM/40 mEq PM - Continue KDur 20 mEq daily.  - Continue O2 with ambulation. Desaturations noted with exercise at ALF.  3. Atrial fibrillation: DCCV in 12/18.  Recurrence of atrial fibrillation with initiation of amiodarone then TEE-guided DCCV in 1/19.  Amiodarone stopped fall of 2022, back in AF in 12/22.  Amiodarone restarted and he had DCCV to NSR in 1/23 but atrial fibrillation recurred < 2 days despite amiodarone.  DCCV again in 2/23 with recurrence of AF again.  Remains in Afib on device interrogation - He has failed amiodarone at this point and we will not re-attempt DCCV.  - He will continue Toprol XL 25 mg daily for rate control.     - Continue Eliquis  4. CKDIIIa: Baseline Scr around 1 - BMET today.    5. Hyperlipidemia: Good lipids in 1/25.    Followup: As scheduled with Dr. Shirlee Latch in 04/25  Signed, [facility], Dalbert Garnet, PA-C  11/07/2023

## 2023-11-07 NOTE — Progress Notes (Signed)
 ReDS Vest / Clip - 11/07/23 1400       ReDS Vest / Clip   Station Marker C    Ruler Value 29    ReDS Value Range Low volume    ReDS Actual Value 34

## 2023-11-07 NOTE — Patient Instructions (Signed)
 Medication Changes:  INCREASE MIDODRINE TO 15MG  THREE TIMES DAILY   Lab Work:  Labs done today, your results will be available in MyChart, we will contact you for abnormal readings.   Follow-Up in: AS SCHEDULED   At the [facility], you and your health needs are our priority. We have a designated team specialized in the treatment of Heart Failure. This Care Team includes your primary Heart Failure Specialized Cardiologist (physician), Advanced Practice Providers (APPs- Physician Assistants and Nurse Practitioners), and Pharmacist who all work together to provide you with the care you need, when you need it.   You may see any of the following providers on your designated Care Team at your next follow up:  Dr. Arvilla Meres Dr. Marca Ancona Dr. Dorthula Nettles Dr. Theresia Bough Tonye Becket, NP Robbie Lis, [place] [facility] [place], [place] Brynda Peon, NP [place] Lee, NP Karle Plumber, PharmD   Please be sure to bring in all your medications bottles to every appointment.   Need to Contact us:  If you have any questions or concerns before your next appointment please send [place] a message through [place] or call our office at [phone].    TO LEAVE A MESSAGE FOR THE NURSE SELECT OPTION 2, PLEASE LEAVE A MESSAGE INCLUDING: YOUR NAME DATE OF BIRTH CALL BACK NUMBER REASON FOR CALL**this is important as we prioritize the call backs  YOU WILL RECEIVE A CALL BACK THE SAME DAY AS LONG AS YOU CALL BEFORE 4:00 PM

## 2023-11-07 NOTE — Telephone Encounter (Signed)
 Son aware of recommendations however requests an appointment to discuss with provider  -seen by PT 3/19 and was told patient had significant drop in blood pressure while standing -son aware patient is on midodrine and wanted to make sure additional med changes are not needed Add on 3/20 @ 3

## 2023-11-07 NOTE — Telephone Encounter (Signed)
 Patient's son called after hour line, states [facility] assisted living facility would not accept written orders from medical assistant. He was told patient needs to take K 40 meq tonight and starting tomorrow for hypokalemia, orders were given by Lillia Abed -PA today but the fax document states orders are from our medical assistant Chantel. He is asking this to be corrected so the facility would administer medications according. Will forward this to [employer]

## 2023-11-08 ENCOUNTER — Telehealth (HOSPITAL_COMMUNITY): Payer: Self-pay | Admitting: Surgery

## 2023-11-08 ENCOUNTER — Ambulatory Visit (HOSPITAL_COMMUNITY)
Admission: RE | Admit: 2023-11-08 | Discharge: 2023-11-08 | Disposition: A | Discharge status: Home / Self Care | Source: Ambulatory Visit | Attending: Cardiology | Admitting: Cardiology

## 2023-11-08 DIAGNOSIS — I482 Chronic atrial fibrillation, unspecified: Secondary | ICD-10-CM

## 2023-11-08 DIAGNOSIS — E876 Hypokalemia: Secondary | ICD-10-CM

## 2023-11-08 DIAGNOSIS — I5032 Chronic diastolic (congestive) heart failure: Secondary | ICD-10-CM

## 2023-11-08 LAB — BASIC METABOLIC PANEL
Anion gap: 7 (ref 5–15)
BUN: 15 mg/dL (ref 8–23)
CO2: 31 mmol/L (ref 22–32)
Calcium: 9.2 mg/dL (ref 8.9–10.3)
Chloride: 100 mmol/L (ref 98–111)
Creatinine, Ser: 1.04 mg/dL (ref 0.61–1.24)
GFR, Estimated: >60 mL/min (ref >60)
Glucose, Bld: 99 mg/dL (ref 70–99)
Potassium: 3.4 mmol/L — ABNORMAL LOW (ref 3.5–5.1)
Sodium: 138 mmol/L (ref 135–145)

## 2023-11-08 NOTE — Telephone Encounter (Signed)
 Patient's son contacted clinic regarding follow-up about labwork and specifically Potassium level collected today.  Orders were faxed to [facility] to indicate per Anna Genre PA, patient is to take Potassium 40 meq today and then 40 meq daily.  He will return for labwork to be checked again on Monday at 0930.

## 2023-11-08 NOTE — Telephone Encounter (Signed)
 This has been addressed and new RX signed by Prince Rome, NP and faxed in

## 2023-11-11 ENCOUNTER — Ambulatory Visit (HOSPITAL_COMMUNITY)
Admission: RE | Admit: 2023-11-11 | Discharge: 2023-11-11 | Disposition: A | Discharge status: Home / Self Care | Source: Ambulatory Visit | Attending: Cardiology | Admitting: Cardiology

## 2023-11-11 DIAGNOSIS — E876 Hypokalemia: Secondary | ICD-10-CM | POA: Diagnosis not present

## 2023-11-11 DIAGNOSIS — I482 Chronic atrial fibrillation, unspecified: Secondary | ICD-10-CM | POA: Diagnosis not present

## 2023-11-11 DIAGNOSIS — I5032 Chronic diastolic (congestive) heart failure: Secondary | ICD-10-CM | POA: Insufficient documentation

## 2023-11-11 LAB — BASIC METABOLIC PANEL
Anion gap: 13 (ref 5–15)
BUN: 13 mg/dL (ref 8–23)
CO2: 25 mmol/L (ref 22–32)
Calcium: 9.2 mg/dL (ref 8.9–10.3)
Chloride: 99 mmol/L (ref 98–111)
Creatinine, Ser: 1.22 mg/dL (ref 0.61–1.24)
GFR, Estimated: 55 mL/min — ABNORMAL LOW (ref >60)
Glucose, Bld: 113 mg/dL — ABNORMAL HIGH (ref 70–99)
Potassium: 3.4 mmol/L — ABNORMAL LOW (ref 3.5–5.1)
Sodium: 137 mmol/L (ref 135–145)

## 2023-11-12 ENCOUNTER — Telehealth (HOSPITAL_COMMUNITY): Payer: Self-pay | Admitting: *Deleted

## 2023-11-12 DIAGNOSIS — I5032 Chronic diastolic (congestive) heart failure: Secondary | ICD-10-CM

## 2023-11-12 MED ORDER — POTASSIUM CHLORIDE CRYS ER 20 MEQ PO TBCR
60.0000 meq | EXTENDED_RELEASE_TABLET | Freq: Every day | ORAL | 11 refills | Status: DC
Start: 2023-11-12 — End: 2023-11-21

## 2023-11-12 NOTE — Telephone Encounter (Signed)
 Called son with following lab results and instructions per Unice Cobble, PA:  "K still low but improving. We increased K to 40 mEq daily on 03/21 but med list still says 20? Can you confirm? If he is taking 40 daily, needs to increase to 60 and check BMET in 10 days."  Son verifies patient is taking 40 meq daily at [facility]. He asked that we fax order with new dosing and repeat lab date to that facility. He says order must be signed by MD.   Order faxed to [facility]: Phone: [phone] Fax: [phone] Repeat BMP ordered and scheduled - date and time included in faxed order.

## 2023-11-14 ENCOUNTER — Telehealth (HOSPITAL_COMMUNITY): Payer: Self-pay | Admitting: Cardiology

## 2023-11-14 ENCOUNTER — Emergency Department (HOSPITAL_COMMUNITY)

## 2023-11-14 ENCOUNTER — Emergency Department (HOSPITAL_COMMUNITY)
Admission: EM | Admit: 2023-11-14 | Discharge: 2023-11-14 | Disposition: A | Discharge status: Home / Self Care | Attending: Emergency Medicine | Admitting: Emergency Medicine

## 2023-11-14 ENCOUNTER — Encounter (HOSPITAL_COMMUNITY): Payer: Self-pay

## 2023-11-14 DIAGNOSIS — G319 Degenerative disease of nervous system, unspecified: Secondary | ICD-10-CM | POA: Diagnosis not present

## 2023-11-14 DIAGNOSIS — I4891 Unspecified atrial fibrillation: Secondary | ICD-10-CM | POA: Insufficient documentation

## 2023-11-14 DIAGNOSIS — Z79899 Other long term (current) drug therapy: Secondary | ICD-10-CM | POA: Insufficient documentation

## 2023-11-14 DIAGNOSIS — Z7901 Long term (current) use of anticoagulants: Secondary | ICD-10-CM | POA: Insufficient documentation

## 2023-11-14 DIAGNOSIS — W19XXXA Unspecified fall, initial encounter: Secondary | ICD-10-CM | POA: Diagnosis not present

## 2023-11-14 DIAGNOSIS — I10 Essential (primary) hypertension: Secondary | ICD-10-CM | POA: Diagnosis not present

## 2023-11-14 DIAGNOSIS — S0081XA Abrasion of other part of head, initial encounter: Secondary | ICD-10-CM | POA: Insufficient documentation

## 2023-11-14 DIAGNOSIS — S0990XA Unspecified injury of head, initial encounter: Secondary | ICD-10-CM | POA: Diagnosis not present

## 2023-11-14 DIAGNOSIS — I959 Hypotension, unspecified: Secondary | ICD-10-CM | POA: Diagnosis not present

## 2023-11-14 DIAGNOSIS — I6782 Cerebral ischemia: Secondary | ICD-10-CM | POA: Diagnosis not present

## 2023-11-14 LAB — COMPREHENSIVE METABOLIC PANEL WITH GFR
ALT: 14 U/L (ref 0–44)
AST: 21 U/L (ref 15–41)
Albumin: 4 g/dL (ref 3.5–5.0)
Alkaline Phosphatase: 72 U/L (ref 38–126)
Anion gap: 14 (ref 5–15)
BUN: 19 mg/dL (ref 8–23)
CO2: 28 mmol/L (ref 22–32)
Calcium: 9.7 mg/dL (ref 8.9–10.3)
Chloride: 96 mmol/L — ABNORMAL LOW (ref 98–111)
Creatinine, Ser: 1.49 mg/dL — ABNORMAL HIGH (ref 0.61–1.24)
GFR, Estimated: 43 mL/min — ABNORMAL LOW (ref >60)
Glucose, Bld: 114 mg/dL — ABNORMAL HIGH (ref 70–99)
Potassium: 3.6 mmol/L (ref 3.5–5.1)
Sodium: 138 mmol/L (ref 135–145)
Total Bilirubin: 2.3 mg/dL — ABNORMAL HIGH (ref 0.0–1.2)
Total Protein: 7.3 g/dL (ref 6.5–8.1)

## 2023-11-14 LAB — CBC
HCT: 44.9 % (ref 39.0–52.0)
Hemoglobin: 14.9 g/dL (ref 13.0–17.0)
MCH: 32.2 pg (ref 26.0–34.0)
MCHC: 33.2 g/dL (ref 30.0–36.0)
MCV: 97 fL (ref 80.0–100.0)
Platelets: 171 10*3/uL (ref 150–400)
RBC: 4.63 MIL/uL (ref 4.22–5.81)
RDW: 13.1 % (ref 11.5–15.5)
WBC: 10.5 10*3/uL (ref 4.0–10.5)
nRBC: 0 % (ref 0.0–0.2)

## 2023-11-14 LAB — PROTIME-INR
INR: 1.5 — ABNORMAL HIGH (ref 0.8–1.2)
Prothrombin Time: 18.2 s — ABNORMAL HIGH (ref 11.4–15.2)

## 2023-11-14 MED ORDER — SODIUM CHLORIDE 0.9 % IV BOLUS
500.0000 mL | Freq: Once | INTRAVENOUS | Status: AC
  Administered 2023-11-14: 500 mL via INTRAVENOUS

## 2023-11-14 NOTE — ED Notes (Signed)
 Patient transported to CT

## 2023-11-14 NOTE — Telephone Encounter (Signed)
 On 3/26 patients son Dylan [name] called with an FYI. Reports OT therapist noted b/p laying 137/78 standing 91/61 Reported above as FYI in the event additional changes are needed Pt is wearing compression stockings and staying hydrated -pt has history of multiple falls while in facility -pt is taking new dose of midodrine  Pts son Dylan [name] returned call 3/27 to report  Pt fell at facility last night No injuries report Pt is taking eliquis so multiple bruises noted CT negative/unremarkable  Son is requesting to speak with Dr Shirlee Latch and or PharmD to review medications. Would like to provide better quality of life vs continued medications with b/p lowering side effects   Cell [phone]

## 2023-11-14 NOTE — ED Provider Notes (Signed)
 [place] EMERGENCY DEPARTMENT AT [facility] Provider Note   CSN: [id number] Arrival date & time: 11/14/23  6295     History  Chief Complaint  Patient presents with   FOT   Head Laceration    Dylan [name] is a 89 y.o. male.  HPI Elderly male arrives as a level 2 trauma after a fall.  Patient is anticoagulated due to history of atrial fibrillation.  Patient was found by staff at his facility today with evidence for a fall.  Last seen normal yesterday, patient himself answers questions, though slowly, seemingly appropriately about his current condition.  He cannot specify when he fell, however.  Per EMS patient was found with evidence for head trauma, with an abrasion on the posterior of his head.  He is otherwise reported to be acting in a typical manner.  Patient himself denies any focal pain.    Home Medications Prior to Admission medications   Medication Sig Start Date End Date Taking? Authorizing Provider  acetaminophen (TYLENOL) 500 MG tablet Take 500 mg by mouth daily in the afternoon.   Yes [provider]  apixaban (ELIQUIS) 5 MG TABS tablet Take 1 tablet by mouth twice daily 02/08/23  Yes Laurey Morale, MD  docusate sodium (COLACE) 100 MG capsule Take 100 mg by mouth See admin instructions. 1 capsule every week on Wednesday   Yes [provider]  furosemide (LASIX) 20 MG tablet Take 60 mg by mouth in the morning.   Yes [provider]  furosemide (LASIX) 40 MG tablet Take 40 mg by mouth every evening.   Yes [provider]  metoprolol succinate (TOPROL-XL) 25 MG 24 hr tablet Take 1 tablet by mouth once daily Patient taking differently: Take 25 mg by mouth daily. 02/11/23  Yes Laurey Morale, MD  midodrine (PROAMATINE) 5 MG tablet Take 15 mg by mouth 3 (three) times daily with meals.   Yes [provider]  potassium chloride SA (KLOR-CON M) 20 MEQ tablet Take 3 tablets (60 mEq total) by mouth daily. 11/12/23  Yes  Andrey Farmer, PA-C  rosuvastatin (CRESTOR) 10 MG tablet Take 1 tablet by mouth once daily Patient taking differently: Take 10 mg by mouth daily. 02/11/23  Yes Laurey Morale, MD      Allergies    Keflex [cephalexin]    Review of Systems   Review of Systems  Physical Exam Updated Vital Signs BP 114/64   Pulse (!) 43   Temp (!) 97.5 F (36.4 C) (Oral)   Resp 13   Ht 6' (1.829 m)   Wt 104.3 kg   SpO2 100%   BMI 31.19 kg/m  Physical Exam Vitals and nursing note reviewed.  Constitutional:      General: He is not in acute distress.    Appearance: He is well-developed.  HENT:     Head: Normocephalic.   Eyes:     Conjunctiva/sclera: Conjunctivae normal.  Cardiovascular:     Rate and Rhythm: Normal rate and regular rhythm.  Pulmonary:     Effort: Pulmonary effort is normal. No respiratory distress.     Breath sounds: No stridor.  Abdominal:     General: There is no distension.  Skin:    General: Skin is warm and dry.  Neurological:     Mental Status: He is alert.     Motor: Atrophy present. No tremor.  Psychiatric:        Cognition and Memory: Memory is impaired.  ED Results / Procedures / Treatments   Labs (all labs ordered are listed, but only abnormal results are displayed) Labs Reviewed  COMPREHENSIVE METABOLIC PANEL WITH GFR - Abnormal; Notable for the following components:      Result Value   Chloride 96 (*)    Glucose, Bld 114 (*)    Creatinine, Ser 1.49 (*)    Total Bilirubin 2.3 (*)    GFR, Estimated 43 (*)    All other components within normal limits  PROTIME-INR - Abnormal; Notable for the following components:   Prothrombin Time 18.2 (*)    INR 1.5 (*)    All other components within normal limits  CBC    EKG EKG Interpretation Date/Time:  Thursday November 14 2023 08:37:03 EDT Ventricular Rate:  92 PR Interval:    QRS Duration:  89 QT Interval:  375 QTC Calculation: 464 R Axis:   52  Text Interpretation: Atrial fibrillation  Abnormal R-wave progression, early transition Nonspecific repol abnormality, diffuse leads Confirmed by Gerhard Munch [phone]) on 11/14/2023 8:40:21 AM  Radiology CT HEAD WO CONTRAST Result Date: 11/14/2023 CLINICAL DATA:  Fall on blood thinners with no visible injury. EXAM: CT HEAD WITHOUT CONTRAST CT CERVICAL SPINE WITHOUT CONTRAST TECHNIQUE: Multidetector CT imaging of the head and cervical spine was performed following the standard protocol without intravenous contrast. Multiplanar CT image reconstructions of the cervical spine were also generated. RADIATION DOSE REDUCTION: This exam was performed according to the departmental dose-optimization program which includes automated exposure control, adjustment of the mA and/or kV according to patient size and/or use of iterative reconstruction technique. COMPARISON:  Cervical spine CT 10/08/2020 FINDINGS: CT HEAD FINDINGS Brain: No evidence of acute infarction, hemorrhage, hydrocephalus, extra-axial collection or mass lesion/mass effect. Generalized brain atrophy. Periventricular chronic small vessel ischemia. Vascular: No hyperdense vessel or unexpected calcification. Skull: No acute fracture Sinuses/Orbits: No evidence of injury CT CERVICAL SPINE FINDINGS Alignment: No traumatic malalignment, reversal of cervical lordosis Skull base and vertebrae: No acute fracture. No primary bone lesion or focal pathologic process. Soft tissues and spinal canal: No prevertebral fluid or swelling. No visible canal hematoma. Disc levels: Bulky degenerative spurring with C3-4 facet ankylosis and C5-C7 intervertebral ankylosis. Uncovertebral spurring causes foraminal impingement especially at C2-3 and C3-4. Upper chest: No visible injury IMPRESSION: No evidence of acute intracranial or cervical spine injury. Prominent brain atrophy and cervical spine degeneration. Electronically Signed   By: Tiburcio Pea M.D.   On: 11/14/2023 09:36   CT CERVICAL SPINE WO CONTRAST Result  Date: 11/14/2023 CLINICAL DATA:  Fall on blood thinners with no visible injury. EXAM: CT HEAD WITHOUT CONTRAST CT CERVICAL SPINE WITHOUT CONTRAST TECHNIQUE: Multidetector CT imaging of the head and cervical spine was performed following the standard protocol without intravenous contrast. Multiplanar CT image reconstructions of the cervical spine were also generated. RADIATION DOSE REDUCTION: This exam was performed according to the departmental dose-optimization program which includes automated exposure control, adjustment of the mA and/or kV according to patient size and/or use of iterative reconstruction technique. COMPARISON:  Cervical spine CT 10/08/2020 FINDINGS: CT HEAD FINDINGS Brain: No evidence of acute infarction, hemorrhage, hydrocephalus, extra-axial collection or mass lesion/mass effect. Generalized brain atrophy. Periventricular chronic small vessel ischemia. Vascular: No hyperdense vessel or unexpected calcification. Skull: No acute fracture Sinuses/Orbits: No evidence of injury CT CERVICAL SPINE FINDINGS Alignment: No traumatic malalignment, reversal of cervical lordosis Skull base and vertebrae: No acute fracture. No primary bone lesion or focal pathologic process. Soft tissues and spinal canal: No prevertebral  fluid or swelling. No visible canal hematoma. Disc levels: Bulky degenerative spurring with C3-4 facet ankylosis and C5-C7 intervertebral ankylosis. Uncovertebral spurring causes foraminal impingement especially at C2-3 and C3-4. Upper chest: No visible injury IMPRESSION: No evidence of acute intracranial or cervical spine injury. Prominent brain atrophy and cervical spine degeneration. Electronically Signed   By: Tiburcio Pea M.D.   On: 11/14/2023 09:36    Procedures Procedures    Medications Ordered in ED Medications  sodium chloride 0.9 % bolus 500 mL (500 mLs Intravenous New Bag/Given 11/14/23 1022)    ED Course/ Medical Decision Making/ A&P                                  Medical Decision Making Elderly male with head trauma following unwitnessed fall.  Patient is anticoagulated and concern for intracranial hemorrhage versus fracture as well as cervical spine disruption considered.  Patient placed on monitor, had labs, CT, wound care. Cardiac 90's - afib, abnormal Pulse ox 96% 2 L nasal cannula, abnormal, but baseline  Amount and/or Complexity of Data Reviewed Independent Historian: EMS External Data Reviewed: notes. Labs: ordered. Decision-making details documented in ED Course. Radiology: ordered and independent interpretation performed. Decision-making details documented in ED Course. ECG/medicine tests: ordered and independent interpretation performed. Decision-making details documented in ED Course.  Risk Decision regarding hospitalization. Diagnosis or treatment significantly limited by social determinants of health.   11:20 AM Patient awake and alert on repeat exam, no new complaints.  He is not accompanied by his son, daughter, we reviewed all CT findings, labs.  Labs notable for mild dehydration and he has received fluid resuscitation.  Head CT, no fracture, no intracranial hemorrhage.  Length conversation about the patient's medications, history, decline in cognitive function, and recent placement into assisted living.  With ongoing use of both diuretics/antihypertensives as well as midodrine, and Eliquis for stroke risk reduction for A-fib encouraged the family to have additional conversation with cardiology pharmacist as an outpatient, as well as palliative care for considerations of quality life.  Minimal to conversations, patient discharged in stable condition with family members.        Final Clinical Impression(s) / ED Diagnoses Final diagnoses:  Fall, initial encounter  Injury of head, initial encounter    Rx / DC Orders ED Discharge Orders     None         Gerhard Munch, MD 11/14/23 1121

## 2023-11-14 NOTE — Progress Notes (Signed)
 Orthopedic Tech Progress Note Patient Details:  Dylan [name] [birth year] [id number] Level 2 Trauma. Not needed Patient ID: Dylan [name], male   DOB: [birth year], 89 y.o.   MRN: [id number]  [name] 11/14/2023, 9:06 AM

## 2023-11-14 NOTE — Telephone Encounter (Signed)
 Unfortunately, he is on maximal dose of midodrine at this point.  There is not much else we can do to cut back on meds, but would have him decrease Toprol XL to 12.5 mg daily and take at bedtime.  Make sure he is waring compression stockings.

## 2023-11-14 NOTE — ED Notes (Signed)
 Trauma Response Nurse Documentation  Dylan [name] is a 89 y.o. male arriving to [facility] ED via EMS  On Eliquis (apixaban) daily. Trauma was activated as a Level 2 based on the following trauma criteria Elderly patients > 65 with head trauma on anti-coagulation (excluding ASA).  Patient cleared for CT by Dr. Jeraldine Loots. Pt transported to CT with trauma response nurse present to monitor. RN remained with the patient throughout their absence from the department for clinical observation.   GCS 15, baseline dementia.  History   Past Medical History:  Diagnosis Date   COVID-19    Diabetes mellitus without complication (HCC)    Dyspnea    History of hiatal hernia    Hyperglycemia    Hyperlipidemia    Hypertension    Lung nodule    Myocardial infarction [facility])    Presence of permanent cardiac pacemaker      Past Surgical History:  Procedure Laterality Date   APPENDECTOMY  1956   BACK SURGERY  2008   CARDIAC CATHETERIZATION  1995   negative results   CARDIOVERSION N/A 08/05/2017   Procedure: CARDIOVERSION;  Surgeon: Laurey Morale, MD;  Location: [facility] ENDOSCOPY;  Service: Cardiovascular;  Laterality: N/A;   CARDIOVERSION N/A 09/12/2017   Procedure: CARDIOVERSION;  Surgeon: Laurey Morale, MD;  Location: [facility] ENDOSCOPY;  Service: Cardiovascular;  Laterality: N/A;   CARDIOVERSION N/A 09/15/2021   Procedure: CARDIOVERSION;  Surgeon: Laurey Morale, MD;  Location: [facility] ENDOSCOPY;  Service: Cardiovascular;  Laterality: N/A;   CARDIOVERSION N/A 10/12/2021   Procedure: CARDIOVERSION;  Surgeon: Laurey Morale, MD;  Location: [facility] ENDOSCOPY;  Service: Cardiovascular;  Laterality: N/A;   COLON SURGERY  07/09/12   HERNIA REPAIR  1952   HOT HEMOSTASIS  01/08/2012   Procedure: HOT HEMOSTASIS (ARGON PLASMA COAGULATION/BICAP);  Surgeon: Willis Modena, MD;  Location: [facility] ENDOSCOPY;  Service: Endoscopy;  Laterality: N/A;   LAPAROSCOPIC ILEOCECECTOMY  07/09/2012   Procedure: LAPAROSCOPIC ILEOCECECTOMY;   Surgeon: Mariella Saa, MD;  Location: [facility] ORS;  Service: General;  Laterality: N/A;  Laparoscopic Ileocecectomy   PACEMAKER IMPLANT N/A 10/05/2017   Procedure: PACEMAKER IMPLANT;  Surgeon: Duke Salvia, MD;  Location: [facility] INVASIVE CV LAB;  Service: Cardiovascular;  Laterality: N/A;   REPLACEMENT TOTAL KNEE  2010   TEE WITHOUT CARDIOVERSION N/A 08/05/2017   Procedure: TRANSESOPHAGEAL ECHOCARDIOGRAM (TEE);  Surgeon: Laurey Morale, MD;  Location: [facility] ENDOSCOPY;  Service: Cardiovascular;  Laterality: N/A;   TEE WITHOUT CARDIOVERSION N/A 09/12/2017   Procedure: TRANSESOPHAGEAL ECHOCARDIOGRAM (TEE);  Surgeon: Laurey Morale, MD;  Location: [facility] ENDOSCOPY;  Service: Cardiovascular;  Laterality: N/A;     Initial Focused Assessment (If applicable, or please see trauma documentation): Patient A&O, baseline dementia Airway intact, bilateral breath sounds Pulses 2+  CT's Completed:   CT Head and CT C-Spine   Interventions:  IV, labs CT Head/Cspine  Plan for disposition:  Discharge home   Event Summary: Patient to ED after an unwitnessed fall from [facility]. Patient takes Eliquis, baseline dementia. Imaging was ordered and revealed no traumatic injury. Family at bedside, Dr Jeraldine Loots had discussion about QOL/palliative. Plans to discharge patient back to SNF.  Bedside handoff with ED RN Lenis Dickinson.    Jill Side Lashan Macias  Trauma Response RN  Please call TRN at [phone] for further assistance.

## 2023-11-14 NOTE — ED Triage Notes (Signed)
 Per EMS, Pt, from [facility], presents after an unwitnessed fall.  Posterior head lac noted.  Bleeding controlled.  Pt does take Eliquis.  Pt denies other complaints.   Vitals HR 70 BP 132/78 O2 98% 2L [place] (baseline)

## 2023-11-14 NOTE — Discharge Instructions (Addendum)
 Today's evaluation has been generally reassuring.  However, if you develop new, or concerning changes do not hesitate to return here.  Otherwise, be sure to follow-up with your cardio just as planned and consider consultation with the cardiology pharmacist as well as with palliative care for medication adjustments.

## 2023-11-15 NOTE — Telephone Encounter (Signed)
 Attempted to contact patient's son, Gala Romney, to relay medication change per Dr. Shirlee Latch. Left VM with call back number.  Nils Pyle, PharmD PGY1 Pharmacy Resident

## 2023-11-18 ENCOUNTER — Other Ambulatory Visit: Payer: Self-pay

## 2023-11-18 ENCOUNTER — Encounter (HOSPITAL_COMMUNITY): Payer: Self-pay | Admitting: *Deleted

## 2023-11-18 ENCOUNTER — Emergency Department (HOSPITAL_COMMUNITY)

## 2023-11-18 ENCOUNTER — Inpatient Hospital Stay (HOSPITAL_COMMUNITY)
Admission: EM | Admit: 2023-11-18 | Discharge: 2023-11-21 | DRG: 092 | Disposition: A | Discharge status: Skilled Nursing Facility | Source: Skilled Nursing Facility | Attending: Internal Medicine | Admitting: Internal Medicine

## 2023-11-18 DIAGNOSIS — R001 Bradycardia, unspecified: Secondary | ICD-10-CM | POA: Diagnosis present

## 2023-11-18 DIAGNOSIS — M778 Other enthesopathies, not elsewhere classified: Secondary | ICD-10-CM | POA: Diagnosis not present

## 2023-11-18 DIAGNOSIS — I739 Peripheral vascular disease, unspecified: Secondary | ICD-10-CM | POA: Diagnosis present

## 2023-11-18 DIAGNOSIS — E119 Type 2 diabetes mellitus without complications: Secondary | ICD-10-CM

## 2023-11-18 DIAGNOSIS — I10 Essential (primary) hypertension: Secondary | ICD-10-CM | POA: Diagnosis not present

## 2023-11-18 DIAGNOSIS — I4891 Unspecified atrial fibrillation: Secondary | ICD-10-CM | POA: Diagnosis not present

## 2023-11-18 DIAGNOSIS — S3992XA Unspecified injury of lower back, initial encounter: Secondary | ICD-10-CM | POA: Diagnosis not present

## 2023-11-18 DIAGNOSIS — E1122 Type 2 diabetes mellitus with diabetic chronic kidney disease: Secondary | ICD-10-CM | POA: Diagnosis present

## 2023-11-18 DIAGNOSIS — Y92091 Bathroom in other non-institutional residence as the place of occurrence of the external cause: Secondary | ICD-10-CM

## 2023-11-18 DIAGNOSIS — Z950 Presence of cardiac pacemaker: Secondary | ICD-10-CM | POA: Diagnosis not present

## 2023-11-18 DIAGNOSIS — I951 Orthostatic hypotension: Secondary | ICD-10-CM | POA: Diagnosis not present

## 2023-11-18 DIAGNOSIS — E860 Dehydration: Secondary | ICD-10-CM | POA: Diagnosis present

## 2023-11-18 DIAGNOSIS — I130 Hypertensive heart and chronic kidney disease with heart failure and stage 1 through stage 4 chronic kidney disease, or unspecified chronic kidney disease: Secondary | ICD-10-CM | POA: Diagnosis present

## 2023-11-18 DIAGNOSIS — E876 Hypokalemia: Secondary | ICD-10-CM | POA: Diagnosis present

## 2023-11-18 DIAGNOSIS — Z9981 Dependence on supplemental oxygen: Secondary | ICD-10-CM

## 2023-11-18 DIAGNOSIS — W19XXXA Unspecified fall, initial encounter: Secondary | ICD-10-CM | POA: Diagnosis not present

## 2023-11-18 DIAGNOSIS — Z66 Do not resuscitate: Secondary | ICD-10-CM | POA: Diagnosis present

## 2023-11-18 DIAGNOSIS — I5032 Chronic diastolic (congestive) heart failure: Secondary | ICD-10-CM | POA: Diagnosis present

## 2023-11-18 DIAGNOSIS — Z87891 Personal history of nicotine dependence: Secondary | ICD-10-CM

## 2023-11-18 DIAGNOSIS — S0990XA Unspecified injury of head, initial encounter: Secondary | ICD-10-CM | POA: Diagnosis not present

## 2023-11-18 DIAGNOSIS — M48061 Spinal stenosis, lumbar region without neurogenic claudication: Secondary | ICD-10-CM | POA: Diagnosis not present

## 2023-11-18 DIAGNOSIS — I495 Sick sinus syndrome: Secondary | ICD-10-CM | POA: Diagnosis not present

## 2023-11-18 DIAGNOSIS — R52 Pain, unspecified: Secondary | ICD-10-CM

## 2023-11-18 DIAGNOSIS — E1151 Type 2 diabetes mellitus with diabetic peripheral angiopathy without gangrene: Secondary | ICD-10-CM | POA: Diagnosis present

## 2023-11-18 DIAGNOSIS — N3000 Acute cystitis without hematuria: Secondary | ICD-10-CM

## 2023-11-18 DIAGNOSIS — N390 Urinary tract infection, site not specified: Secondary | ICD-10-CM | POA: Diagnosis present

## 2023-11-18 DIAGNOSIS — R296 Repeated falls: Principal | ICD-10-CM

## 2023-11-18 DIAGNOSIS — Z79899 Other long term (current) drug therapy: Secondary | ICD-10-CM

## 2023-11-18 DIAGNOSIS — Z9181 History of falling: Secondary | ICD-10-CM

## 2023-11-18 DIAGNOSIS — R0609 Other forms of dyspnea: Secondary | ICD-10-CM | POA: Diagnosis present

## 2023-11-18 DIAGNOSIS — M19011 Primary osteoarthritis, right shoulder: Secondary | ICD-10-CM | POA: Diagnosis not present

## 2023-11-18 DIAGNOSIS — S299XXA Unspecified injury of thorax, initial encounter: Secondary | ICD-10-CM | POA: Diagnosis not present

## 2023-11-18 DIAGNOSIS — M545 Low back pain, unspecified: Secondary | ICD-10-CM | POA: Diagnosis present

## 2023-11-18 DIAGNOSIS — Z8249 Family history of ischemic heart disease and other diseases of the circulatory system: Secondary | ICD-10-CM

## 2023-11-18 DIAGNOSIS — M549 Dorsalgia, unspecified: Secondary | ICD-10-CM | POA: Diagnosis not present

## 2023-11-18 DIAGNOSIS — Z823 Family history of stroke: Secondary | ICD-10-CM

## 2023-11-18 DIAGNOSIS — S199XXA Unspecified injury of neck, initial encounter: Secondary | ICD-10-CM | POA: Diagnosis not present

## 2023-11-18 DIAGNOSIS — E1140 Type 2 diabetes mellitus with diabetic neuropathy, unspecified: Secondary | ICD-10-CM | POA: Diagnosis present

## 2023-11-18 DIAGNOSIS — E785 Hyperlipidemia, unspecified: Secondary | ICD-10-CM | POA: Diagnosis present

## 2023-11-18 DIAGNOSIS — M25511 Pain in right shoulder: Secondary | ICD-10-CM | POA: Diagnosis not present

## 2023-11-18 DIAGNOSIS — M25519 Pain in unspecified shoulder: Secondary | ICD-10-CM | POA: Diagnosis not present

## 2023-11-18 DIAGNOSIS — G319 Degenerative disease of nervous system, unspecified: Secondary | ICD-10-CM | POA: Diagnosis not present

## 2023-11-18 DIAGNOSIS — Z8616 Personal history of COVID-19: Secondary | ICD-10-CM

## 2023-11-18 DIAGNOSIS — M25512 Pain in left shoulder: Secondary | ICD-10-CM | POA: Diagnosis not present

## 2023-11-18 DIAGNOSIS — M19012 Primary osteoarthritis, left shoulder: Secondary | ICD-10-CM | POA: Diagnosis present

## 2023-11-18 DIAGNOSIS — I252 Old myocardial infarction: Secondary | ICD-10-CM

## 2023-11-18 DIAGNOSIS — Z7901 Long term (current) use of anticoagulants: Secondary | ICD-10-CM

## 2023-11-18 DIAGNOSIS — N1831 Chronic kidney disease, stage 3a: Secondary | ICD-10-CM | POA: Diagnosis present

## 2023-11-18 DIAGNOSIS — M546 Pain in thoracic spine: Secondary | ICD-10-CM | POA: Diagnosis present

## 2023-11-18 DIAGNOSIS — Z881 Allergy status to other antibiotic agents status: Secondary | ICD-10-CM

## 2023-11-18 LAB — URINALYSIS, MICROSCOPIC (REFLEX)

## 2023-11-18 LAB — CBC WITH DIFFERENTIAL/PLATELET
Abs Immature Granulocytes: 0.02 10*3/uL (ref 0.00–0.07)
Basophils Absolute: 0.1 10*3/uL (ref 0.0–0.1)
Basophils Relative: 1 %
Eosinophils Absolute: 0.1 10*3/uL (ref 0.0–0.5)
Eosinophils Relative: 1 %
HCT: 41.5 % (ref 39.0–52.0)
Hemoglobin: 13.7 g/dL (ref 13.0–17.0)
Immature Granulocytes: 0 %
Lymphocytes Relative: 21 %
Lymphs Abs: 1.8 10*3/uL (ref 0.7–4.0)
MCH: 32.1 pg (ref 26.0–34.0)
MCHC: 33 g/dL (ref 30.0–36.0)
MCV: 97.2 fL (ref 80.0–100.0)
Monocytes Absolute: 0.6 10*3/uL (ref 0.1–1.0)
Monocytes Relative: 7 %
Neutro Abs: 6.2 10*3/uL (ref 1.7–7.7)
Neutrophils Relative %: 70 %
Platelets: 170 10*3/uL (ref 150–400)
RBC: 4.27 MIL/uL (ref 4.22–5.81)
RDW: 13.2 % (ref 11.5–15.5)
WBC: 8.8 10*3/uL (ref 4.0–10.5)
nRBC: 0 % (ref 0.0–0.2)

## 2023-11-18 LAB — BASIC METABOLIC PANEL WITH GFR
Anion gap: 12 (ref 5–15)
BUN: 16 mg/dL (ref 8–23)
CO2: 27 mmol/L (ref 22–32)
Calcium: 9.4 mg/dL (ref 8.9–10.3)
Chloride: 100 mmol/L (ref 98–111)
Creatinine, Ser: 1.35 mg/dL — ABNORMAL HIGH (ref 0.61–1.24)
GFR, Estimated: 48 mL/min — ABNORMAL LOW (ref >60)
Glucose, Bld: 108 mg/dL — ABNORMAL HIGH (ref 70–99)
Potassium: 3.6 mmol/L (ref 3.5–5.1)
Sodium: 139 mmol/L (ref 135–145)

## 2023-11-18 LAB — URINALYSIS, ROUTINE W REFLEX MICROSCOPIC
Bilirubin Urine: NEGATIVE
Glucose, UA: NEGATIVE mg/dL
Ketones, ur: NEGATIVE mg/dL
Nitrite: POSITIVE — AB
Protein, ur: NEGATIVE mg/dL
Specific Gravity, Urine: 1.015 (ref 1.005–1.030)
pH: 8.5 — ABNORMAL HIGH (ref 5.0–8.0)

## 2023-11-18 LAB — MAGNESIUM: Magnesium: 1.9 mg/dL (ref 1.7–2.4)

## 2023-11-18 LAB — CK: Total CK: 48 U/L — ABNORMAL LOW (ref 49–397)

## 2023-11-18 MED ORDER — ONDANSETRON HCL 4 MG/2ML IJ SOLN
4.0000 mg | Freq: Four times a day (QID) | INTRAMUSCULAR | Status: DC | PRN
Start: 2023-11-18 — End: 2023-11-21

## 2023-11-18 MED ORDER — IBUPROFEN 400 MG PO TABS: 400.0000 mg | ORAL_TABLET | Freq: Four times a day (QID) | ORAL | Status: DC | PRN

## 2023-11-18 MED ORDER — EPINEPHRINE 0.3 MG/0.3ML IJ SOAJ: 0.3000 mg | Freq: Once | INTRAMUSCULAR | Status: DC | PRN

## 2023-11-18 MED ORDER — ACETAMINOPHEN 500 MG PO TABS
1000.0000 mg | ORAL_TABLET | Freq: Three times a day (TID) | ORAL | Status: DC
  Administered 2023-11-18 – 2023-11-21 (×10): 1000 mg via ORAL
  Filled 2023-11-18 (×11): qty 2

## 2023-11-18 MED ORDER — SODIUM CHLORIDE 0.9 % IV SOLN
2.0000 g | INTRAVENOUS | Status: DC
  Administered 2023-11-18 – 2023-11-21 (×4): 2 g via INTRAVENOUS
  Filled 2023-11-18 (×4): qty 20

## 2023-11-18 MED ORDER — FENTANYL CITRATE PF 50 MCG/ML IJ SOSY
50.0000 ug | PREFILLED_SYRINGE | Freq: Once | INTRAMUSCULAR | Status: AC
  Administered 2023-11-18: 50 ug via INTRAVENOUS
  Filled 2023-11-18: qty 1

## 2023-11-18 MED ORDER — APIXABAN 5 MG PO TABS
5.0000 mg | ORAL_TABLET | Freq: Two times a day (BID) | ORAL | Status: DC
  Administered 2023-11-18 – 2023-11-21 (×6): 5 mg via ORAL
  Filled 2023-11-18 (×6): qty 1

## 2023-11-18 MED ORDER — ONDANSETRON HCL 4 MG PO TABS: 4.0000 mg | ORAL_TABLET | Freq: Four times a day (QID) | ORAL | Status: DC | PRN

## 2023-11-18 MED ORDER — SODIUM CHLORIDE 0.9 % IV SOLN: INTRAVENOUS | Status: AC

## 2023-11-18 MED ORDER — SODIUM CHLORIDE 0.9 % IV SOLN
1.0000 g | Freq: Once | INTRAVENOUS | Status: DC
  Filled 2023-11-18: qty 10

## 2023-11-18 MED ORDER — OXYCODONE HCL 5 MG PO TABS: 5.0000 mg | ORAL_TABLET | ORAL | Status: DC | PRN

## 2023-11-18 MED ORDER — FENTANYL CITRATE PF 50 MCG/ML IJ SOSY: 25.0000 ug | PREFILLED_SYRINGE | Freq: Once | INTRAMUSCULAR | Status: DC | PRN

## 2023-11-18 MED ORDER — LEVALBUTEROL HCL 0.63 MG/3ML IN NEBU: 0.6300 mg | INHALATION_SOLUTION | RESPIRATORY_TRACT | Status: DC | PRN

## 2023-11-18 MED ORDER — SODIUM CHLORIDE 0.9% FLUSH
3.0000 mL | Freq: Two times a day (BID) | INTRAVENOUS | Status: DC
  Administered 2023-11-18 – 2023-11-21 (×7): 3 mL via INTRAVENOUS

## 2023-11-18 MED ORDER — ROSUVASTATIN CALCIUM 5 MG PO TABS
10.0000 mg | ORAL_TABLET | Freq: Every day | ORAL | Status: DC
  Administered 2023-11-19 – 2023-11-21 (×3): 10 mg via ORAL
  Filled 2023-11-18 (×4): qty 2

## 2023-11-18 MED ORDER — LIDOCAINE 5 % EX PTCH
1.0000 | MEDICATED_PATCH | CUTANEOUS | Status: DC
  Administered 2023-11-18 – 2023-11-20 (×3): 1 via TRANSDERMAL
  Filled 2023-11-18 (×4): qty 1

## 2023-11-18 MED ORDER — DOCUSATE SODIUM 100 MG PO CAPS
100.0000 mg | ORAL_CAPSULE | Freq: Every day | ORAL | Status: DC
  Administered 2023-11-19 – 2023-11-21 (×3): 100 mg via ORAL
  Filled 2023-11-18 (×4): qty 1

## 2023-11-18 MED ORDER — SENNA 8.6 MG PO TABS
1.0000 | ORAL_TABLET | Freq: Two times a day (BID) | ORAL | Status: DC
  Administered 2023-11-18 – 2023-11-21 (×7): 8.6 mg via ORAL
  Filled 2023-11-18 (×7): qty 1

## 2023-11-18 MED ORDER — DIPHENHYDRAMINE HCL 50 MG/ML IJ SOLN: 25.0000 mg | Freq: Once | INTRAMUSCULAR | Status: DC | PRN

## 2023-11-18 MED ORDER — HYDRALAZINE HCL 20 MG/ML IJ SOLN: 5.0000 mg | Freq: Four times a day (QID) | INTRAMUSCULAR | Status: DC | PRN

## 2023-11-18 MED ORDER — MIDODRINE HCL 5 MG PO TABS
15.0000 mg | ORAL_TABLET | Freq: Three times a day (TID) | ORAL | Status: DC
  Administered 2023-11-18 – 2023-11-21 (×9): 15 mg via ORAL
  Filled 2023-11-18 (×10): qty 3

## 2023-11-18 NOTE — Evaluation (Signed)
 Occupational Therapy Evaluation Patient Details Name: Dylan [name] MRN: [id number] DOB: [birth year] Today's Date: 11/18/2023   History of Present Illness   89 y.o. male presents 3/31 to the emergency department via EMS from [facility] after being found down on the ground from a fall.  PMHx: chronic congestive heart failure, atrial fibrillation, type II DM, dyspnea on exertion, hypertension, diabetic neuropathy, pacemaker, orthostatic intolerance.     Clinical Impressions Pt evaluated s/p the admission list above. As of 6 weeks ago, pt is a resident at [facility] and receives setup assistance from staff for ADLs. At baseline, pt completes functional mobility using a rollator. Upon evaluation, pt was limited by cognition, back pain, generalized weakness, impaired balance, and safety. Pt required up to MOD A for bed mobility, completed STS with MIN A, and functional mobility around the perimeter of bed using RW with MIN A. Pt required verbal cues for safe management of RW and proximity to RW. Based on evaluation, pt will require up to MOD A for ADLs. Due to recent decline in function pt will benefit from additional therapy services. OT will continue following pt acutely with discharge recommendations of inpatient therapy services >3hrs/day to maximize functional independence.      If plan is discharge home, recommend the following:   A little help with walking and/or transfers;A lot of help with bathing/dressing/bathroom;Assistance with cooking/housework;Direct supervision/assist for medications management;Direct supervision/assist for financial management;Assist for transportation;Help with stairs or ramp for entrance;Supervision due to cognitive status     Functional Status Assessment   Patient has had a recent decline in their functional status and demonstrates the ability to make significant improvements in function in a reasonable and predictable amount of time.      Equipment Recommendations   Other (comment) (defer)     Recommendations for Other Services   Rehab consult     Precautions/Restrictions   Precautions Precautions: Fall;Other (comment) Recall of Precautions/Restrictions: Impaired Precaution/Restrictions Comments: Back precautions for comfort Restrictions Weight Bearing Restrictions Per Provider Order: No     Mobility Bed Mobility Overal bed mobility: Needs Assistance Bed Mobility: Supine to Sit, Sit to Supine     Supine to sit: Min assist, Used rails Sit to supine: Mod assist, HOB elevated   General bed mobility comments: Pt required verbal cues for sequencing. Pt with reports of increased pain in back with transition. Attempted education of log roll technique.    Transfers Overall transfer level: Needs assistance Equipment used: Rolling walker (2 wheels) Transfers: Sit to/from Stand Sit to Stand: Min assist           General transfer comment: Pt impulsively standing, requiring MIN A for balance. Once standing, pt reported dizziness. Dizziness quickly subsided per pt report      Balance Overall balance assessment: Needs assistance Sitting-balance support: No upper extremity supported, Feet supported Sitting balance-[name]: Fair Sitting balance - Comments: static sitting EOB   Standing balance support: Bilateral upper extremity supported, Reliant on assistive device for balance, During functional activity Standing balance-[name]: Poor Standing balance comment: Pt reliant on AD for balance while staticly standing and performing functional mobility around perimeter of bed                           ADL either performed or assessed with clinical judgement   ADL Overall ADL's : Needs assistance/impaired Eating/Feeding: Set up;Sitting   Grooming: Set up;Sitting   Upper Body Bathing: Supervision/ safety;Sitting  Lower Body Bathing: Sit to/from stand;Moderate assistance   Upper Body  Dressing : Set up;Sitting   Lower Body Dressing: Moderate assistance;Sitting/lateral leans;Sit to/from stand   Toilet Transfer: Minimal assistance;Ambulation;Rolling walker (2 wheels);Regular Toilet   Toileting- Clothing Manipulation and Hygiene: Moderate assistance;Sit to/from stand       Functional mobility during ADLs: Minimal assistance;Rolling walker (2 wheels) General ADL Comments: Pt with generalized weakness, back pain, and impaired cognition impacting occupational performance.     Vision Ability to See in Adequate Light: 0 Adequate Patient Visual Report: No change from baseline Vision Assessment?: No apparent visual deficits     Perception Perception: Not tested       Praxis Praxis: Not tested       Pertinent Vitals/Pain Pain Assessment Pain Assessment: Faces Faces Pain Scale: Hurts even more Pain Location: Pt reports back pain with movement Pain Descriptors / Indicators: Aching Pain Intervention(s): Monitored during session     Extremity/Trunk Assessment Upper Extremity Assessment Upper Extremity Assessment: Generalized weakness   Lower Extremity Assessment Lower Extremity Assessment: Defer to PT evaluation       Communication Communication Communication: Impaired Factors Affecting Communication: Hearing impaired   Cognition Arousal: Alert Behavior During Therapy: WFL for tasks assessed/performed Cognition: No family/caregiver present to determine baseline             OT - Cognition Comments: Pt AOx4; impulsive with standing; Unable to recall assistance provided by nursing staff at ALF                 Following commands: Impaired Following commands impaired: Only follows one step commands consistently     Cueing  General Comments   Cueing Techniques: Verbal cues;Tactile cues  VSS on supplemental O2; Initially reported dizziness once standing but dizziness quickly subsided per pt report   Exercises     Shoulder Instructions       Home Living Family/patient expects to be discharged to:: Assisted living Living Arrangements: Alone;Other (Comment) Available Help at Discharge:  (Staff at ALF) Type of Home: Other(Comment) (ALF [facility])                       Home Equipment: Agricultural consultant (2 wheels);Rollator (4 wheels)   Additional Comments: Wife fell 6 weeks ago, family transferred pt to [facility] for increased care.      Prior Functioning/Environment Prior Level of Function : Needs assist;History of Falls (last six months)             Mobility Comments: Son reports pt using rollator for mobility. Has had approx 8 falls that began about 3 weeks ago. He was quite active prior to this - he had a fall at the [facility] with his wife. ADLs Comments: Staff assist every 2 hours at ALF. Set-up for bath and dress, but able to bathe and dress himself. Staff provide meals.    OT Problem List: Decreased strength;Decreased range of motion;Decreased activity tolerance;Impaired balance (sitting and/or standing);Decreased coordination;Decreased cognition;Decreased safety awareness;Decreased knowledge of use of DME or AE;Pain   OT Treatment/Interventions: Self-care/ADL training;Therapeutic exercise;Energy conservation;DME and/or AE instruction;Therapeutic activities;Cognitive remediation/compensation;Patient/family education;Balance training      OT Goals(Current goals can be found in the care plan section)   Acute Rehab OT Goals Patient Stated Goal: none stated OT Goal Formulation: With patient Time For Goal Achievement: 12/02/23 Potential to Achieve Goals: Good ADL Goals Pt Will Perform Grooming: with supervision;standing Pt Will Perform Upper Body Dressing: with supervision;sitting Pt Will Perform Lower  Body Dressing: with supervision;sit to/from stand Pt Will Transfer to Toilet: with supervision;ambulating;regular height toilet Pt Will Perform Toileting - Clothing Manipulation and hygiene: with  supervision;sit to/from stand;sitting/lateral leans Additional ADL Goal #1: Pt will follow multi-step verbal commands 75% of session to improve independence in functional tasks   OT Frequency:  Min 2X/week    Co-evaluation              AM-PAC OT "6 Clicks" Daily Activity     Outcome Measure Help from another person eating meals?: A Little Help from another person taking care of personal grooming?: A Little Help from another person toileting, which includes using toliet, bedpan, or urinal?: A Lot Help from another person bathing (including washing, rinsing, drying)?: A Lot Help from another person to put on and taking off regular upper body clothing?: A Little Help from another person to put on and taking off regular lower body clothing?: A Lot 6 Click Score: 15   End of Session Equipment Utilized During Treatment: Gait belt;Rolling walker (2 wheels) Nurse Communication: Mobility status  Activity Tolerance: Patient tolerated treatment well Patient left: in bed;with call bell/phone within reach;with bed alarm set  OT Visit Diagnosis: Unsteadiness on feet (R26.81);Other abnormalities of gait and mobility (R26.89);Repeated falls (R29.6);Muscle weakness (generalized) (M62.81);History of falling (Z91.81)                Time: 1610-9604 OT Time Calculation (min): 22 min Charges:  OT General Charges $OT Visit: 1 Visit OT Evaluation $OT Eval Low Complexity: 1 Low  [address], Darliss Cheney 11/18/2023, 6:03 PM

## 2023-11-18 NOTE — Telephone Encounter (Signed)
 Pts son aware  Son requested a chart review of current admission by Dr Shirlee Latch -currently being treated for uti -wanted to confirm medication changes are still needed if symptoms could have came from uti

## 2023-11-18 NOTE — ED Provider Notes (Signed)
 [place] EMERGENCY DEPARTMENT AT [facility] Provider Note   CSN: [id number] Arrival date & time: 11/18/23  0546     History  Chief Complaint  Patient presents with   Dylan [name] is a 89 y.o. male.  Patient with past medical history significant for chronic congestive heart failure, atrial fibrillation, type II DM, dyspnea on exertion, hypertension, diabetic neuropathy, pacemaker, orthostatic intolerance presents to the emergency department via EMS from [facility].  Patient found on floor by staff with walker beside him with an unwitnessed fall.  Patient reports history of multiple falls which have been increasing in frequency.  The patient states he got dizzy and fell down this evening.  He does remember the fall.  He does not believe he hit his head during the fall.  He does take Eliquis due to atrial fibrillation.  Patient complains mainly of thoracic and lumbar back pain and bilateral shoulder pain.  The patient does have evidence of old skin tears on bilateral arms but no active bleeding noticed.    Fall       Home Medications Prior to Admission medications   Medication Sig Start Date End Date Taking? Authorizing Provider  acetaminophen (TYLENOL) 500 MG tablet Take 500 mg by mouth daily in the afternoon.    [provider]  apixaban Everlene Balls) 5 MG TABS tablet Take 1 tablet by mouth twice daily 02/08/23   Laurey Morale, MD  docusate sodium (COLACE) 100 MG capsule Take 100 mg by mouth See admin instructions. 1 capsule every week on Wednesday    [provider]  furosemide (LASIX) 20 MG tablet Take 60 mg by mouth in the morning.    [provider]  furosemide (LASIX) 40 MG tablet Take 40 mg by mouth every evening.    [provider]  metoprolol succinate (TOPROL-XL) 25 MG 24 hr tablet Take 1 tablet by mouth once daily Patient taking differently: Take 25 mg by mouth daily. 02/11/23   Laurey Morale, MD  midodrine  (PROAMATINE) 5 MG tablet Take 15 mg by mouth 3 (three) times daily with meals.    [provider]  potassium chloride SA (KLOR-CON M) 20 MEQ tablet Take 3 tablets (60 mEq total) by mouth daily. 11/12/23   Andrey Farmer, PA-C  rosuvastatin (CRESTOR) 10 MG tablet Take 1 tablet by mouth once daily Patient taking differently: Take 10 mg by mouth daily. 02/11/23   Laurey Morale, MD      Allergies    Keflex [cephalexin]    Review of Systems   Review of Systems  Physical Exam Updated Vital Signs BP 124/72 (BP Location: Left Arm)   Pulse 74   Temp 98 F (36.7 C) (Oral)   Resp 18   Ht 6' (1.829 m)   Wt 104.3 kg   SpO2 100%   BMI 31.19 kg/m  Physical Exam Vitals and nursing note reviewed.  Constitutional:      General: He is not in acute distress.    Appearance: He is well-developed.  HENT:     Head: Normocephalic and atraumatic.  Eyes:     Conjunctiva/sclera: Conjunctivae normal.  Cardiovascular:     Rate and Rhythm: Normal rate and regular rhythm.     Heart sounds: No murmur heard. Pulmonary:     Effort: Pulmonary effort is normal. No respiratory distress.     Breath sounds: Normal breath sounds.  Abdominal:     Palpations: Abdomen is soft.  Tenderness: There is no abdominal tenderness.  Musculoskeletal:        General: Tenderness present. No swelling.     Cervical back: Normal range of motion and neck supple.     Right lower leg: No edema.     Left lower leg: No edema.     Comments: Midline tenderness to lower thoracic/upper lumbar spinal region.  Grossly normal range of motion of bilateral shoulders.   Skin:    General: Skin is warm and dry.     Capillary Refill: Capillary refill takes less than 2 seconds.  Neurological:     Mental Status: He is alert.  Psychiatric:        Mood and Affect: Mood normal.     ED Results / Procedures / Treatments   Labs (all labs ordered are listed, but only abnormal results are displayed) Labs Reviewed  CBC  WITH DIFFERENTIAL/PLATELET  BASIC METABOLIC PANEL WITH GFR  URINALYSIS, ROUTINE W REFLEX MICROSCOPIC    EKG None  Radiology No results found.  Procedures Procedures    Medications Ordered in ED Medications - No data to display  ED Course/ Medical Decision Making/ A&P                                 Medical Decision Making Amount and/or Complexity of Data Reviewed Labs: ordered. Radiology: ordered.   This patient presents to the ED for concern of possible injuries post fall, this involves an extensive number of treatment options, and is a complaint that carries with it a high risk of complications and morbidity.  The differential diagnosis includes fracture, dislocation, soft tissue injuries, intracranial abnormality, other   Co morbidities that complicate the patient evaluation  Patient with orthostatic intolerance, currently on maximum dose of midodrine   Additional history obtained:  Additional history obtained from EMS External records from outside source obtained and reviewed including cardiology notes   Lab Tests:  I Ordered, and personally interpreted labs.  The pertinent results include: Unremarkable CBC   Imaging Studies ordered:  I ordered imaging studies including CT scans without contrast of the head, cervical, thoracic, and lumbar spine; plain films of bilateral shoulders Imaging pending   Cardiac Monitoring: / EKG:  The patient was maintained on a cardiac monitor.  I personally viewed and interpreted the cardiac monitored which showed an underlying rhythm of: Atrial fibrillation    Social Determinants of Health:  Patient is a former smoker   Test / Admission - Considered:  Based on chart review it appears that patient has been maxed out on his midodrine dosage to help with blood pressure issues.  It seems likely that he may have had another episode of orthostatic intolerance this evening causing his fall.  Patient care being transferred  to [facility], PA-C.  Disposition pending results of lab work and imaging.         Final Clinical Impression(s) / ED Diagnoses Final diagnoses:  Fall, initial encounter    Rx / DC Orders ED Discharge Orders     None         Pamala Duffel 11/18/23 8657    Nira Conn, MD 11/18/23 [phone]

## 2023-11-18 NOTE — ED Notes (Signed)
 Pt unable to sit up due to back pain, despite receiving pain med at 0834. Pt would quite likely also not be able to ambulate at this time.

## 2023-11-18 NOTE — ED Triage Notes (Signed)
 Patient presents to ED via [facility]  from [facility] patient was an unwitnessed fall was found on the floor by staff with walker bedside him . Patient has a history of falls and has been falling more recently. Patient has old skin tears on his arms. C/o mid to lower back pain and bilateral posterior shoulder pain. Denies hitting his head. Patient is on eliquis for a-fib.

## 2023-11-18 NOTE — Progress Notes (Signed)
 Inpatient Rehab Admissions Coordinator:   Per therapy recommendations, patient was screened for CIR candidacy by Megan Salon, MS, CCC-SLP . At this time, all imaging appears negative and  Pt. does not appear to demonstrate medical necessity to justify in hospital rehabilitation/CIR. I will not pursue a rehab consult for this Pt.   Recommend other rehab venues to be pursued.  Please contact me with any questions.  Megan Salon, MS, CCC-SLP Rehab Admissions Coordinator  [phone] (celll) [phone] (office)

## 2023-11-18 NOTE — Evaluation (Addendum)
 Physical Therapy Evaluation Patient Details Name: Dylan [name] MRN: [id number] DOB: 1925-09-27 Today's Date: 11/18/2023  History of Present Illness  89 y.o. male presents 3/31 to the emergency department via EMS from [facility] after being found down on the ground from a fall.  PMHx: chronic congestive heart failure, atrial fibrillation, type II DM, dyspnea on exertion, hypertension, diabetic neuropathy, pacemaker, orthostatic intolerance.   Clinical Impression  Pt admitted with above diagnosis. Son provides majority of history, reporting pt not at cognitive baseline today although is oriented to self, year, and situation but thinks he is at [facility] for a fall, and unsure of month - reports pt would not typically know month. Was fairly active living with wife until she fell about 6 weeks ago. Patient was taken to ALF for additional care where private staff check on pt every 2 hours; provide set-up for bath and dress but can do each of those tasks on his own. He started having falls about 3 weeks ago (typically posteriorly.) Uses a rollator at baseline. Denied dizziness during assessment today (Orthostatics negative - see details in vitals tab,) wearing compression stockings from home. Educated on back precautions for comfort, log roll was helpful for reducing pain with mobility to EOB. Stood with min assist, lateral steps along bed. Progress gait next visit. Patient will benefit from intensive inpatient follow-up therapy, >3 hours/day. Benefit from post-acute care to improve balance, independence, and prevent recurrent falls. Pt currently with functional limitations due to the deficits listed below (see PT Problem List). Pt will benefit from acute skilled PT to increase their independence and safety with mobility to allow discharge.           If plan is discharge home, recommend the following: A little help with walking and/or transfers;A little help with  bathing/dressing/bathroom;Assistance with cooking/housework;Direct supervision/assist for medications management;Direct supervision/assist for financial management;Assist for transportation;Supervision due to cognitive status   Can travel by private vehicle        Equipment Recommendations None recommended by PT  Recommendations for Other Services       Functional Status Assessment Patient has had a recent decline in their functional status and demonstrates the ability to make significant improvements in function in a reasonable and predictable amount of time.     Precautions / Restrictions Precautions Precautions: Fall;Other (comment) Recall of Precautions/Restrictions: Impaired Precaution/Restrictions Comments: Back precautions for comfort Restrictions Weight Bearing Restrictions Per Provider Order: No      Mobility  Bed Mobility Overal bed mobility: Needs Assistance Bed Mobility: Rolling, Sidelying to Sit, Sit to Sidelying Rolling: Min assist, Used rails Sidelying to sit: Min assist     Sit to sidelying: Min assist General bed mobility comments: Educated on log roll techniques for comfort due to back pain. Min assist to facilitate and reach for rail, trunk support to rise, and LE support back into bed. Intermittent increase in pain but states much more comfortable with technique demonstrated today.    Transfers Overall transfer level: Needs assistance Equipment used: Ambulation equipment used Transfers: Sit to/from Stand Sit to Stand: Min assist           General transfer comment: Min assist for boost to stand, held back of chair to stabilize. Could not locate RW to progress during evaluation. Trunk flexed but without buckling. Able to take several lateral steps along bed holding chair with CGA. Stood for approx 4 minutes, back pain caused to sit.    Ambulation/Gait  General Gait Details: Likely able but limited due to difficulty obtaining sats  and BP after standing; took an extended period of time for an accurate read and by then pt back pain increased quite a bit. Will progess next visit.  Stairs            Wheelchair Mobility     Tilt Bed    Modified Rankin (Stroke Patients Only)       Balance Overall balance assessment: Needs assistance Sitting-balance support: No upper extremity supported, Feet supported Sitting balance-[name]: Fair     Standing balance support: Single extremity supported, During functional activity Standing balance-[name]: Poor                               Pertinent Vitals/Pain Pain Assessment Pain Assessment: Faces Faces Pain Scale: Hurts even more Pain Location: back with movement. Lt shoulder Pain Descriptors / Indicators: Aching Pain Intervention(s): Monitored during session, Repositioned, Limited activity within patient's tolerance    Home Living Family/patient expects to be discharged to:: Assisted living Living Arrangements: Alone;Other (Comment) (staff assist every 2 hours) Available Help at Discharge: Other (Comment) (Staff at ALF) Type of Home: Other(Comment) (ALF [facility])           Home Equipment: Agricultural consultant (2 wheels);Rollator (4 wheels) Additional Comments: Wife fell 6 weeks ago, family transferred pt to [facility] ALF for increased care.    Prior Function Prior Level of Function : Needs assist;History of Falls (last six months)             Mobility Comments: Son reports pt using rollator for mobility. Has had approx 8 falls that began about 3 weeks ago. He was quite active prior to this - he had a fall at the [facility] with his wife. ADLs Comments: Staff assist every 2 hours at ALF. Set-up for bath and dress, but able to bathe and dress himself. Staff provide meals.     Extremity/Trunk Assessment   Upper Extremity Assessment Upper Extremity Assessment: Defer to OT evaluation    Lower Extremity Assessment Lower Extremity  Assessment: Generalized weakness       Communication   Communication Communication: Impaired Factors Affecting Communication: Hearing impaired    Cognition Arousal: Alert Behavior During Therapy: WFL for tasks assessed/performed   PT - Cognitive impairments: Orientation, Sequencing, Problem solving, Safety/Judgement   Orientation impairments: Place, Time                   PT - Cognition Comments: Son reports pt not at baseline cognition. Currently thinks he is at [facility]. Recalls year and that he is here due to a fall. Does not recall falling though. Oriented to self and year. Unsure of month; son states he would not typically recall month though at baseline. Following commands: Impaired Following commands impaired: Only follows one step commands consistently     Cueing Cueing Techniques: Verbal cues, Gestural cues     General Comments General comments (skin integrity, edema, etc.): Denies dizziness during activities today; see orthostatics for details in vital tab. SpO2 95% on 1.5L supplemental O2.    Exercises     Assessment/Plan    PT Assessment Patient needs continued PT services  PT Problem List Decreased strength;Decreased range of motion;Decreased activity tolerance;Decreased balance;Decreased mobility;Decreased knowledge of use of DME;Decreased cognition;Decreased safety awareness;Decreased knowledge of precautions;Cardiopulmonary status limiting activity;Pain       PT Treatment Interventions DME instruction;Gait training;Functional  mobility training;Therapeutic activities;Therapeutic exercise;Balance training;Neuromuscular re-education;Cognitive remediation;Patient/family education    PT Goals (Current goals can be found in the Care Plan section)  Acute Rehab PT Goals Patient Stated Goal: get well PT Goal Formulation: With patient/family Time For Goal Achievement: 12/02/23 Potential to Achieve Goals: Good    Frequency Min 2X/week      Co-evaluation               AM-PAC PT "6 Clicks" Mobility  Outcome Measure Help needed turning from your back to your side while in a flat bed without using bedrails?: A Little Help needed moving from lying on your back to sitting on the side of a flat bed without using bedrails?: A Little Help needed moving to and from a bed to a chair (including a wheelchair)?: A Little Help needed standing up from a chair using your arms (e.g., wheelchair or bedside chair)?: A Little Help needed to walk in hospital room?: A Little Help needed climbing 3-5 steps with a railing? : A Lot 6 Click Score: 17    End of Session Equipment Utilized During Treatment: Gait belt;Oxygen Activity Tolerance: Patient tolerated treatment well Patient left: in bed;with call bell/phone within reach;with bed alarm set;with family/visitor present   PT Visit Diagnosis: Unsteadiness on feet (R26.81);Muscle weakness (generalized) (M62.81);Difficulty in walking, not elsewhere classified (R26.2);Other symptoms and signs involving the nervous system (R29.898);Pain Pain - part of body:  (back and left shoulder)    Time: 1610-9604 PT Time Calculation (min) (ACUTE ONLY): 56 min   Charges:   PT Evaluation $PT Eval Low Complexity: 1 Low PT Treatments $Therapeutic Activity: 38-52 mins PT General Charges $$ ACUTE PT VISIT: 1 Visit         Kathlyn Sacramento, PT, DPT [facility]  Rehabilitation Services Physical Therapist Office: [phone] Website: [email or web address]   Berton Mount 11/18/2023, 3:23 PM

## 2023-11-18 NOTE — H&P (Signed)
 History and Physical    Dylan [name] ZOX:[id number] DOB: [birth year] DOA: 11/18/2023  PCP: Gaspar Garbe, MD  Patient coming from: Assisted living facility at [facility]  I have personally briefly reviewed patient's old medical records in [facility]  Chief Complaint: Falls  HPI: Dylan [name] is a 89 y.o. male with medical history significant of atrial fibrillation on chronic anticoagulation with Eliquis, orthostatic hypotension on midodrine, diabetes mellitus, hypertension, CHF, tachybradycardia syndrome with junctional bradycardia, status post [employer] dual-chamber PPM, chronic diastolic CHF, CKD stage IIIa with baseline creatinine approximately 1, hyperlipidemia who presented to ED with fall.  Son at bedside who provides most of the history and states patient has had approximately 8 falls over the past 3 weeks.  Patient noted to have guarding up in the middle of the night to go to the bathroom, noted to have fallen into the shower with complaints of low back pain.  Patient states was dizzy prior to his fall and does remember falling.  Patient denies hitting his head.  Patient denies any syncopal episode.  Patient denies any fevers, no chills, no nausea, no vomiting, no chest pain, no change in chronic shortness of breath, no diarrhea, no melena, no hematemesis, no hematochezia.  Per son patient might have some bouts of constipation.  Per son patient with bouts of dizziness when he stands up recently.  Patient noted with multiple falls recently.  Patient in the ED, given IV fentanyl for pain however still with lower back pain with inability to ambulate and as such hospitalist were called to admit the patient.  ED Course: Patient seen in the ED, basic metabolic profile with a creatinine of 1.35 otherwise within normal limits.  CBC within normal limits.  Urinalysis with moderate leukocytes, nitrite positive, pH of 8.5, many bacteria, 6-10 WBCs.  Urine cultures pending.  Bilateral  plain films of the shoulders done consistent with osteoarthritis without acute findings.  CT head CT C-spine with no acute intracranial abnormality or cervical spine fracture.  Moderate chronic small vessel ischemic disease and atrophy.  CT T and L-spine done with no acute osseous abnormality, widespread degenerative changes, severe spinal stenosis at L1-2.  Patient noted to receive some fentanyl in the ED with systolic blood pressures dropping into the 90s.  Patient with complaints of significant back pain with inability to ambulate.  Review of Systems: As per HPI otherwise all other systems reviewed and are negative.  Past Medical History:  Diagnosis Date   COVID-19    Diabetes mellitus without complication (HCC)    Dyspnea    History of hiatal hernia    Hyperglycemia    Hyperlipidemia    Hypertension    Lung nodule    Myocardial infarction [facility])    Presence of permanent cardiac pacemaker     Past Surgical History:  Procedure Laterality Date   APPENDECTOMY  1956   BACK SURGERY  2008   CARDIAC CATHETERIZATION  1995   negative results   CARDIOVERSION N/A 08/05/2017   Procedure: CARDIOVERSION;  Surgeon: Laurey Morale, MD;  Location: [facility] ENDOSCOPY;  Service: Cardiovascular;  Laterality: N/A;   CARDIOVERSION N/A 09/12/2017   Procedure: CARDIOVERSION;  Surgeon: Laurey Morale, MD;  Location: [facility] ENDOSCOPY;  Service: Cardiovascular;  Laterality: N/A;   CARDIOVERSION N/A 09/15/2021   Procedure: CARDIOVERSION;  Surgeon: Laurey Morale, MD;  Location: [facility] ENDOSCOPY;  Service: Cardiovascular;  Laterality: N/A;   CARDIOVERSION N/A 10/12/2021   Procedure: CARDIOVERSION;  Surgeon: Laurey Morale,  MD;  Location: [facility] ENDOSCOPY;  Service: Cardiovascular;  Laterality: N/A;   COLON SURGERY  07/09/12   HERNIA REPAIR  1952   HOT HEMOSTASIS  01/08/2012   Procedure: HOT HEMOSTASIS (ARGON PLASMA COAGULATION/BICAP);  Surgeon: Willis Modena, MD;  Location: [facility] ENDOSCOPY;  Service: Endoscopy;  Laterality:  N/A;   LAPAROSCOPIC ILEOCECECTOMY  07/09/2012   Procedure: LAPAROSCOPIC ILEOCECECTOMY;  Surgeon: Mariella Saa, MD;  Location: [facility] ORS;  Service: General;  Laterality: N/A;  Laparoscopic Ileocecectomy   PACEMAKER IMPLANT N/A 10/05/2017   Procedure: PACEMAKER IMPLANT;  Surgeon: Duke Salvia, MD;  Location: [facility] INVASIVE CV LAB;  Service: Cardiovascular;  Laterality: N/A;   REPLACEMENT TOTAL KNEE  2010   TEE WITHOUT CARDIOVERSION N/A 08/05/2017   Procedure: TRANSESOPHAGEAL ECHOCARDIOGRAM (TEE);  Surgeon: Laurey Morale, MD;  Location: [facility] ENDOSCOPY;  Service: Cardiovascular;  Laterality: N/A;   TEE WITHOUT CARDIOVERSION N/A 09/12/2017   Procedure: TRANSESOPHAGEAL ECHOCARDIOGRAM (TEE);  Surgeon: Laurey Morale, MD;  Location: [facility] ENDOSCOPY;  Service: Cardiovascular;  Laterality: N/A;    Social History  reports that he quit smoking about 31 years ago. His smoking use included cigarettes. He has never used smokeless tobacco. He reports that he does not currently use alcohol after a past usage of about 2.0 standard drinks of alcohol per week. He reports that he does not use drugs.  Allergies  Allergen Reactions   Keflex [Cephalexin] Hives and Other (See Comments)    Occurred in the 1980's    Family History  Problem Relation Age of Onset   Cancer - Other Mother    CVA Father    Heart attack Brother    Heart attack Maternal Grandmother    Family history noncontributory.  Prior to Admission medications   Medication Sig Start Date End Date Taking? Authorizing Provider  acetaminophen (TYLENOL) 500 MG tablet Take 500 mg by mouth daily in the afternoon.   Yes [provider]  apixaban (ELIQUIS) 5 MG TABS tablet Take 1 tablet by mouth twice daily 02/08/23  Yes Laurey Morale, MD  docusate sodium (COLACE) 100 MG capsule Take 100 mg by mouth See admin instructions. 1 capsule every week on Wednesday   Yes [provider]  furosemide (LASIX) 20 MG tablet Take 60 mg by mouth  in the morning.   Yes [provider]  furosemide (LASIX) 40 MG tablet Take 40 mg by mouth every evening.   Yes [provider]  metoprolol succinate (TOPROL-XL) 25 MG 24 hr tablet Take 1 tablet by mouth once daily Patient taking differently: Take 25 mg by mouth daily. 02/11/23  Yes Laurey Morale, MD  midodrine (PROAMATINE) 5 MG tablet Take 15 mg by mouth 3 (three) times daily with meals.   Yes [provider]  potassium chloride SA (KLOR-CON M) 20 MEQ tablet Take 3 tablets (60 mEq total) by mouth daily. 11/12/23  Yes Andrey Farmer, PA-C  rosuvastatin (CRESTOR) 10 MG tablet Take 1 tablet by mouth once daily Patient taking differently: Take 10 mg by mouth daily. 02/11/23  Yes Laurey Morale, MD    Physical Exam: Vitals:   11/18/23 0645 11/18/23 0700 11/18/23 0930 11/18/23 1048  BP: 109/73 120/66 95/64 111/73  Pulse: 86 74 78 78  Resp: 18 16 16 14   Temp:    97.6 F (36.4 C)  TempSrc:    Oral  SpO2: 95% 96% 99% 94%  Weight:      Height:        Constitutional:  NAD, calm, comfortable Vitals:   11/18/23 0645 11/18/23 0700 11/18/23 0930 11/18/23 1048  BP: 109/73 120/66 95/64 111/73  Pulse: 86 74 78 78  Resp: 18 16 16 14   Temp:    97.6 F (36.4 C)  TempSrc:    Oral  SpO2: 95% 96% 99% 94%  Weight:      Height:       Eyes: PERRL, lids and conjunctivae normal ENMT: Mucous membranes are moist. Posterior pharynx clear of any exudate or lesions.Normal dentition.  Neck: normal, supple, no masses, no thyromegaly Respiratory: clear to auscultation bilaterally, no wheezing, no crackles. Normal respiratory effort. No accessory muscle use.  Cardiovascular: Irregularly irregular.  No JVD, no murmurs rubs or gallops.  No edema.   Abdomen: Abdomen is soft, no tenderness, no masses palpated. No hepatosplenomegaly. Bowel sounds positive.  Musculoskeletal: no clubbing / cyanosis. No joint deformity upper and lower extremities. Good ROM, no contractures. Normal  muscle tone.  Skin: no rashes, lesions, ulcers. No induration Neurologic: CN 2-12 grossly intact. Sensation intact, DTR normal. Strength 5/5 in all 4.  Psychiatric: Normal judgment and insight. Alert and oriented x 3. Normal mood.   Labs on Admission: I have personally reviewed following labs and imaging studies  CBC: Recent Labs  Lab 11/14/23 0846 11/18/23 0607  WBC 10.5 8.8  NEUTROABS  --  6.2  HGB 14.9 13.7  HCT 44.9 41.5  MCV 97.0 97.2  PLT 171 170    Basic Metabolic Panel: Recent Labs  Lab 11/14/23 0846 11/18/23 0607  NA 138 139  K 3.6 3.6  CL 96* 100  CO2 28 27  GLUCOSE 114* 108*  BUN 19 16  CREATININE 1.49* 1.35*  CALCIUM 9.7 9.4    GFR: Estimated Creatinine Clearance: 40.9 mL/min (A) (by C-G formula based on SCr of 1.35 mg/dL (H)).  Liver Function Tests: Recent Labs  Lab 11/14/23 0846  AST 21  ALT 14  ALKPHOS 72  BILITOT 2.3*  PROT 7.3  ALBUMIN 4.0    Urine analysis:    Component Value Date/Time   COLORURINE YELLOW 11/18/2023 0553   APPEARANCEUR HAZY (A) 11/18/2023 0553   LABSPEC 1.015 11/18/2023 0553   PHURINE 8.5 (H) 11/18/2023 0553   GLUCOSEU NEGATIVE 11/18/2023 0553   HGBUR TRACE (A) 11/18/2023 0553   BILIRUBINUR NEGATIVE 11/18/2023 0553   KETONESUR NEGATIVE 11/18/2023 0553   PROTEINUR NEGATIVE 11/18/2023 0553   UROBILINOGEN 1.0 11/22/2009 1423   NITRITE POSITIVE (A) 11/18/2023 0553   LEUKOCYTESUR MODERATE (A) 11/18/2023 0553    Radiological Exams on Admission: CT Thoracic Spine Wo Contrast Result Date: 11/18/2023 CLINICAL DATA:  Back trauma, no prior imaging (Age >= 16y). Unwitnessed fall. Back pain. EXAM: CT THORACIC AND LUMBAR SPINE WITHOUT CONTRAST TECHNIQUE: Multidetector CT imaging of the thoracic and lumbar spine was performed without contrast. Multiplanar CT image reconstructions were also generated. RADIATION DOSE REDUCTION: This exam was performed according to the departmental dose-optimization program which includes automated  exposure control, adjustment of the mA and/or kV according to patient size and/or use of iterative reconstruction technique. COMPARISON:  CT chest, abdomen, and pelvis 06/10/2022 FINDINGS: CT THORACIC SPINE FINDINGS Alignment: Slight scoliosis. No listhesis. Vertebrae: No acute fracture or suspicious lesion. Paraspinal and other soft tissues: Aortic atherosclerosis. Disc levels: Moderate mid to lower thoracic spondylosis. Solid bridging anterior vertebral osteophyte at T6-7 and T9-10. Facet arthrosis which is most prominent on the right at T10-11 resulting in moderate to severe right neural foraminal stenosis. CT LUMBAR SPINE FINDINGS Segmentation: Transitional thoracolumbar  and lumbosacral anatomy with absent ribs at T12 and with L5 being sacralized. Alignment: Slight lumbar dextroscoliosis. Trace anterolisthesis of L2 on L3 and L3 on L4. Vertebrae: No acute fracture or suspicious lesion. Paraspinal and other soft tissues: Aortic atherosclerosis. Disc levels: Widespread advanced lumbar spondylosis. Severe disc space narrowing at L4-5 and moderate narrowing at L3-4. Widespread advanced facet arthrosis with L1-L5 facet ankylosis on the right. Are laminectomies from L2-3 through L4-5. Severe spinal stenosis and moderate left neural foraminal stenosis at L1-2 due to disc bulging and facet and ligamentum flavum hypertrophy. IMPRESSION: 1. No acute osseous abnormality in the thoracic or lumbar spine. 2. Widespread degenerative changes.  Severe spinal stenosis at L1-2. Electronically Signed   By: Sebastian Ache M.D.   On: 11/18/2023 07:31   CT Lumbar Spine Wo Contrast Result Date: 11/18/2023 CLINICAL DATA:  Back trauma, no prior imaging (Age >= 16y). Unwitnessed fall. Back pain. EXAM: CT THORACIC AND LUMBAR SPINE WITHOUT CONTRAST TECHNIQUE: Multidetector CT imaging of the thoracic and lumbar spine was performed without contrast. Multiplanar CT image reconstructions were also generated. RADIATION DOSE REDUCTION: This exam  was performed according to the departmental dose-optimization program which includes automated exposure control, adjustment of the mA and/or kV according to patient size and/or use of iterative reconstruction technique. COMPARISON:  CT chest, abdomen, and pelvis 06/10/2022 FINDINGS: CT THORACIC SPINE FINDINGS Alignment: Slight scoliosis. No listhesis. Vertebrae: No acute fracture or suspicious lesion. Paraspinal and other soft tissues: Aortic atherosclerosis. Disc levels: Moderate mid to lower thoracic spondylosis. Solid bridging anterior vertebral osteophyte at T6-7 and T9-10. Facet arthrosis which is most prominent on the right at T10-11 resulting in moderate to severe right neural foraminal stenosis. CT LUMBAR SPINE FINDINGS Segmentation: Transitional thoracolumbar and lumbosacral anatomy with absent ribs at T12 and with L5 being sacralized. Alignment: Slight lumbar dextroscoliosis. Trace anterolisthesis of L2 on L3 and L3 on L4. Vertebrae: No acute fracture or suspicious lesion. Paraspinal and other soft tissues: Aortic atherosclerosis. Disc levels: Widespread advanced lumbar spondylosis. Severe disc space narrowing at L4-5 and moderate narrowing at L3-4. Widespread advanced facet arthrosis with L1-L5 facet ankylosis on the right. Are laminectomies from L2-3 through L4-5. Severe spinal stenosis and moderate left neural foraminal stenosis at L1-2 due to disc bulging and facet and ligamentum flavum hypertrophy. IMPRESSION: 1. No acute osseous abnormality in the thoracic or lumbar spine. 2. Widespread degenerative changes.  Severe spinal stenosis at L1-2. Electronically Signed   By: Sebastian Ache M.D.   On: 11/18/2023 07:31   CT Head Wo Contrast Result Date: 11/18/2023 CLINICAL DATA:  Head trauma, minor (Age >= 65y); Neck trauma (Age >= 65y). Unwitnessed fall. Bilateral shoulder pain and back pain. EXAM: CT HEAD WITHOUT CONTRAST CT CERVICAL SPINE WITHOUT CONTRAST TECHNIQUE: Multidetector CT imaging of the head  and cervical spine was performed following the standard protocol without intravenous contrast. Multiplanar CT image reconstructions of the cervical spine were also generated. RADIATION DOSE REDUCTION: This exam was performed according to the departmental dose-optimization program which includes automated exposure control, adjustment of the mA and/or kV according to patient size and/or use of iterative reconstruction technique. COMPARISON:  CT head and cervical spine 11/14/2023 FINDINGS: CT HEAD FINDINGS Brain: There is no evidence of an acute infarct, intracranial hemorrhage, mass, midline shift, or extra-axial fluid collection. There is moderate cerebral and cerebellar atrophy. Patchy to confluent hypodensities in the cerebral white matter bilaterally are unchanged and nonspecific but compatible with moderate chronic small vessel ischemic disease. There is likely a chronic  lacunar infarct in the right thalamus. Vascular: Calcified atherosclerosis at the skull base. No hyperdense vessel. Skull: No acute fracture or suspicious lesion. Sinuses/Orbits: Minor mucosal thickening in the paranasal sinuses. Right canal wall up mastoidectomy. Bilateral cataract extraction. Other: None. CT CERVICAL SPINE FINDINGS Alignment: Reversal of the normal cervical lordosis. Unchanged trace anterolisthesis of C2 on C3 and C3 on C4. Skull base and vertebrae: No acute fracture or suspicious lesion. Soft tissues and spinal canal: No prevertebral fluid or swelling. No visible canal hematoma. Disc levels: Similar appearance of advanced cervical spondylosis with interbody ankylosis at C5-6 and C6-7 and right facet ankylosis at C3-4. Multilevel neural foraminal stenosis, particularly severe on the right at C3-4 due to uncovertebral and facet spurring. Likely moderate spinal stenosis at C4-5. Moderate ligamentous thickening and calcification posterior to the dens. Upper chest: No acute finding. Other: None. IMPRESSION: 1. No evidence of acute  intracranial abnormality or cervical spine fracture. 2. Moderate chronic small vessel ischemic disease and atrophy. Electronically Signed   By: Sebastian Ache M.D.   On: 11/18/2023 07:18   CT Cervical Spine Wo Contrast Result Date: 11/18/2023 CLINICAL DATA:  Head trauma, minor (Age >= 65y); Neck trauma (Age >= 65y). Unwitnessed fall. Bilateral shoulder pain and back pain. EXAM: CT HEAD WITHOUT CONTRAST CT CERVICAL SPINE WITHOUT CONTRAST TECHNIQUE: Multidetector CT imaging of the head and cervical spine was performed following the standard protocol without intravenous contrast. Multiplanar CT image reconstructions of the cervical spine were also generated. RADIATION DOSE REDUCTION: This exam was performed according to the departmental dose-optimization program which includes automated exposure control, adjustment of the mA and/or kV according to patient size and/or use of iterative reconstruction technique. COMPARISON:  CT head and cervical spine 11/14/2023 FINDINGS: CT HEAD FINDINGS Brain: There is no evidence of an acute infarct, intracranial hemorrhage, mass, midline shift, or extra-axial fluid collection. There is moderate cerebral and cerebellar atrophy. Patchy to confluent hypodensities in the cerebral white matter bilaterally are unchanged and nonspecific but compatible with moderate chronic small vessel ischemic disease. There is likely a chronic lacunar infarct in the right thalamus. Vascular: Calcified atherosclerosis at the skull base. No hyperdense vessel. Skull: No acute fracture or suspicious lesion. Sinuses/Orbits: Minor mucosal thickening in the paranasal sinuses. Right canal wall up mastoidectomy. Bilateral cataract extraction. Other: None. CT CERVICAL SPINE FINDINGS Alignment: Reversal of the normal cervical lordosis. Unchanged trace anterolisthesis of C2 on C3 and C3 on C4. Skull base and vertebrae: No acute fracture or suspicious lesion. Soft tissues and spinal canal: No prevertebral fluid or  swelling. No visible canal hematoma. Disc levels: Similar appearance of advanced cervical spondylosis with interbody ankylosis at C5-6 and C6-7 and right facet ankylosis at C3-4. Multilevel neural foraminal stenosis, particularly severe on the right at C3-4 due to uncovertebral and facet spurring. Likely moderate spinal stenosis at C4-5. Moderate ligamentous thickening and calcification posterior to the dens. Upper chest: No acute finding. Other: None. IMPRESSION: 1. No evidence of acute intracranial abnormality or cervical spine fracture. 2. Moderate chronic small vessel ischemic disease and atrophy. Electronically Signed   By: Sebastian Ache M.D.   On: 11/18/2023 07:18   DG Shoulder Left Result Date: 11/18/2023 CLINICAL DATA:  Fall with bilateral shoulder pain EXAM: LEFT SHOULDER - 3 VIEW COMPARISON:  None Available. FINDINGS: Glenohumeral and acromioclavicular degenerative joint narrowing and spurring. No acute fracture, dislocation, or separation. IMPRESSION: Osteoarthritis without acute finding. Electronically Signed   By: Tiburcio Pea M.D.   On: 11/18/2023 06:40   DG  Shoulder Right Result Date: 11/18/2023 CLINICAL DATA:  Bilateral shoulder pain after fall. EXAM: RIGHT SHOULDER - 3 VIEW COMPARISON:  None Available. FINDINGS: No acute fracture or dislocation. Joint space narrowing and degenerative spurring at the acromioclavicular and glenohumeral joints. Generalized calcification above the humeral head. IMPRESSION: No acute finding. Osteoarthritis. Electronically Signed   By: Tiburcio Pea M.D.   On: 11/18/2023 06:39    EKG: Independently reviewed.  A-fib  Assessment/Plan Principal Problem:   Frequent falls Active Problems:   DOE (dyspnea on exertion)   Benign essential HTN   Type 2 diabetes mellitus without complication (HCC)   Dyslipidemia   Atrial fibrillation (HCC)   Bradycardia   Chronic diastolic CHF (congestive heart failure) (HCC)   Diabetic neuropathy (HCC)   Sinus node  dysfunction (HCC)   Pacemaker   PVD (peripheral vascular disease) (HCC)   Orthostatic intolerance   Hypokalemia    #1 frequent falls -Patient noted with multiple frequent falls over the past 3 weeks, per son patient has had 8 falls. -Patient with history of chronic orthostatic hypotension on midodrine 15 mg 3 times daily, patient noted to be on diuretics of Lasix 60 every morning and 40 nightly, also noted to be on Toprol-XL and per son awaiting to hear back from cardiology about dose reduction. -Patient noted on presentation with complaints of dizziness on standing from supine position prior to fall and likely consistent with orthostasis.  BP noted to go as low as 95/64 on presentation to the ED. -Urinalysis also concerning for UTI. -Urine cultures pending. -Resume home regimen midodrine 15 mg 3 times daily. -Hold diuretics and Toprol XL for the next 24 hours. -Gentle hydration with IV fluids. -Place TED hose. -It is noted on epic that cardiologist was recommending a decrease of patient's Toprol-XL to 12.5 mg daily in addition to compression stockings. -PT/OT.  2.  Chronic A-fib/tachybradycardia syndrome status post PPM -Patient noted with history of A-fib on chronic anticoagulation. -Patient noted to be on Toprol-XL 25 mg daily which was to be decreased to 12.5 mg daily per cardiologist note on epic due to patient's multiple falls. -Will hold Toprol-XL today as heart rate currently controlled. -Continue Eliquis as patient in a controlled setting at this time and will need to discuss with patient's cardiologist as to whether patient is still a candidate for anticoagulation in light of recent multiple falls.  3.  UTI -Check urine cultures. -IV Rocephin.  4.  Chronic orthostatic hypotension -Resume home regimen midodrine 15 mg 3 times daily. -Gentle hydration for the next 24 hours. -TED hose.  5.  Dehydration -IV fluids.  6.  Chronic diastolic CHF -Patient more on the dry side  and not volume overloaded. -Patient presented with frequent falls, blood pressure noted to be borderline, patient with chronic orthostatic hypotension. -Hold diuretics. -Monitor volume status with gentle hydration.  7.  Low back pain -Secondary to falls. -CT of the T and L-spine with no acute abnormalities. -Lidoderm patch, scheduled Tylenol 1000 mg 3 times daily, oxycodone as needed.  8.  Diabetes mellitus type 2 -Not on diabetic medications per med rec. -Last hemoglobin A1c 5.9 (07/29/2019) -Repeat hemoglobin A1c.  9.  Chronic dyspnea on exertion/chronic respiratory failure -Per son patient on 2 L home O2 during the daytime and with ambulation and not on O2 at bedtime.  DVT prophylaxis: Eliquis Code Status:   DNR Family Communication:  Updated patient and son at bedside. Disposition Plan:   Patient is from:  Assisted living facility  Anticipated  DC to:  Likely requires higher level of care like SNF  Anticipated DC date:  2 to 3 days  Anticipated DC barriers: Clinical improvement.  Consults called:  None Admission status:  Place in observation  Severity of Illness: The appropriate patient status for this patient is OBSERVATION. Observation status is judged to be reasonable and necessary in order to provide the required intensity of service to ensure the patient's safety. The patient's presenting symptoms, physical exam findings, and initial radiographic and laboratory data in the context of their medical condition is felt to place them at decreased risk for further clinical deterioration. Furthermore, it is anticipated that the patient will be medically stable for discharge from the hospital within 2 midnights of admission.     Ramiro Harvest MD [facility]  How to contact the [facility] Attending or Consulting provider 7A - 7P or covering provider during after hours 7P -7A, for this patient?   Check the care team in [facility] and look for a) attending/consulting [facility] provider listed  and b) the [facility] team listed Log into [email or web address] and use [place]'s universal password to access. If you do not have the password, please contact the hospital operator. Locate the [facility] provider you are looking for under [facility] and page to a number that you can be directly reached. If you still have difficulty reaching the provider, please page the [facility] (Director on Call) for the Hospitalists listed on amion for assistance.  11/18/2023, 11:02 AM

## 2023-11-18 NOTE — ED Provider Notes (Signed)
 Physical Exam  BP 124/72 (BP Location: Left Arm)   Pulse 74   Temp 98 F (36.7 C) (Oral)   Resp 18   Ht 6' (1.829 m)   Wt 104.3 kg   SpO2 100%   BMI 31.19 kg/m   Physical Exam Vitals and nursing note reviewed.  Constitutional:      Comments: Sleeping, no acute distress, equal chest rise  Pulmonary:     Effort: Pulmonary effort is normal. No respiratory distress.  Skin:    General: Skin is warm and dry.  Neurological:     Comments: Moving all extremities     Procedures  Procedures  ED Course / MDM    Medical Decision Making Amount and/or Complexity of Data Reviewed Labs: ordered. Radiology: ordered.  Risk Prescription drug management. Decision regarding hospitalization.   Accepted handoff at shift change from [facility]. Please see prior provider note for more detail.   Briefly: Patient is 89 y.o. M presenting today after a fall. The patient has a history of hypotension on midodrine. Recently had his metoprolol dose lowered because of his frequent falls. This was lost in communication and the family was still giving the higher dose. He has orthostatic intolerance. Has been seen for multiple falls previously. If CT scans are unremarkable for acute findings, can be discharged home.   CT imaging does not show any acute findings.  However, patient is having significant pain with ambulation.  Attempted to do orthostatic vital signs however patient had pain to his back and shoulders with movement from a lying to sitting position.  Did attempt to get fentanyl however patient's blood pressure dropped 30 points systolically.  Given his labile blood pressure as well as need for repeat dosing of pain medication, including his recent frequent falls, I do feel the patient should be admitted to medicine for further pain control and optimization.  It was also given the patient does have a urinary tract infection.  No recent cultures seen.  I discussed with Alinda Money [facility] on possible out  occasions given patient's allergy to Keflex.  He reports that Rocephin should be fine given that it is a different generation as well as the patient's last reported allergic reaction was in the 80s.  I discussed this with Dr. Janee Morn the hospitalist and he is okay with proceeding with this medication.  Dr. Laural Benes with [facility] hospitalist will admit the patient.   Results for orders placed or performed during the hospital encounter of 11/18/23  Urinalysis, Routine w reflex microscopic -Urine, Clean Catch   Collection Time: 11/18/23  5:53 AM  Result Value Ref Range   Color, Urine YELLOW YELLOW   APPearance HAZY (A) CLEAR   Specific Gravity, Urine 1.015 1.005 - 1.030   pH 8.5 (H) 5.0 - 8.0   Glucose, UA NEGATIVE NEGATIVE mg/dL   Hgb urine dipstick TRACE (A) NEGATIVE   Bilirubin Urine NEGATIVE NEGATIVE   Ketones, ur NEGATIVE NEGATIVE mg/dL   Protein, ur NEGATIVE NEGATIVE mg/dL   Nitrite POSITIVE (A) NEGATIVE   Leukocytes,Ua MODERATE (A) NEGATIVE  Urinalysis, Microscopic (reflex)   Collection Time: 11/18/23  5:53 AM  Result Value Ref Range   RBC / HPF 0-5 0 - 5 RBC/hpf   WBC, UA 6-10 0 - 5 WBC/hpf   Bacteria, UA MANY (A) NONE SEEN   Squamous Epithelial / HPF 0-5 0 - 5 /HPF   WBC Clumps PRESENT    Urine-Other MICROSCOPIC EXAM PERFORMED ON UNCONCENTRATED URINE   Basic metabolic panel  Collection Time: 11/18/23  6:07 AM  Result Value Ref Range   Sodium 139 135 - 145 mmol/L   Potassium 3.6 3.5 - 5.1 mmol/L   Chloride 100 98 - 111 mmol/L   CO2 27 22 - 32 mmol/L   Glucose, Bld 108 (H) 70 - 99 mg/dL   BUN 16 8 - 23 mg/dL   Creatinine, Ser 1.02 (H) 0.61 - 1.24 mg/dL   Calcium 9.4 8.9 - 72.5 mg/dL   GFR, Estimated 48 (L) >60 mL/min   Anion gap 12 5 - 15  CBC with Differential   Collection Time: 11/18/23  6:07 AM  Result Value Ref Range   WBC 8.8 4.0 - 10.5 K/uL   RBC 4.27 4.22 - 5.81 MIL/uL   Hemoglobin 13.7 13.0 - 17.0 g/dL   HCT 36.6 44.0 - 34.7 %   MCV 97.2 80.0 - 100.0 fL   MCH  32.1 26.0 - 34.0 pg   MCHC 33.0 30.0 - 36.0 g/dL   RDW 42.5 95.6 - 38.7 %   Platelets 170 150 - 400 K/uL   nRBC 0.0 0.0 - 0.2 %   Neutrophils Relative % 70 %   Neutro Abs 6.2 1.7 - 7.7 K/uL   Lymphocytes Relative 21 %   Lymphs Abs 1.8 0.7 - 4.0 K/uL   Monocytes Relative 7 %   Monocytes Absolute 0.6 0.1 - 1.0 K/uL   Eosinophils Relative 1 %   Eosinophils Absolute 0.1 0.0 - 0.5 K/uL   Basophils Relative 1 %   Basophils Absolute 0.1 0.0 - 0.1 K/uL   Immature Granulocytes 0 %   Abs Immature Granulocytes 0.02 0.00 - 0.07 K/uL  CK   Collection Time: 11/18/23  6:07 AM  Result Value Ref Range   Total CK 48 (L) 49 - 397 U/L  Magnesium   Collection Time: 11/18/23  6:07 AM  Result Value Ref Range   Magnesium 1.9 1.7 - 2.4 mg/dL   CT Thoracic Spine Wo Contrast Result Date: 11/18/2023 CLINICAL DATA:  Back trauma, no prior imaging (Age >= 16y). Unwitnessed fall. Back pain. EXAM: CT THORACIC AND LUMBAR SPINE WITHOUT CONTRAST TECHNIQUE: Multidetector CT imaging of the thoracic and lumbar spine was performed without contrast. Multiplanar CT image reconstructions were also generated. RADIATION DOSE REDUCTION: This exam was performed according to the departmental dose-optimization program which includes automated exposure control, adjustment of the mA and/or kV according to patient size and/or use of iterative reconstruction technique. COMPARISON:  CT chest, abdomen, and pelvis 06/10/2022 FINDINGS: CT THORACIC SPINE FINDINGS Alignment: Slight scoliosis. No listhesis. Vertebrae: No acute fracture or suspicious lesion. Paraspinal and other soft tissues: Aortic atherosclerosis. Disc levels: Moderate mid to lower thoracic spondylosis. Solid bridging anterior vertebral osteophyte at T6-7 and T9-10. Facet arthrosis which is most prominent on the right at T10-11 resulting in moderate to severe right neural foraminal stenosis. CT LUMBAR SPINE FINDINGS Segmentation: Transitional thoracolumbar and lumbosacral anatomy  with absent ribs at T12 and with L5 being sacralized. Alignment: Slight lumbar dextroscoliosis. Trace anterolisthesis of L2 on L3 and L3 on L4. Vertebrae: No acute fracture or suspicious lesion. Paraspinal and other soft tissues: Aortic atherosclerosis. Disc levels: Widespread advanced lumbar spondylosis. Severe disc space narrowing at L4-5 and moderate narrowing at L3-4. Widespread advanced facet arthrosis with L1-L5 facet ankylosis on the right. Are laminectomies from L2-3 through L4-5. Severe spinal stenosis and moderate left neural foraminal stenosis at L1-2 due to disc bulging and facet and ligamentum flavum hypertrophy. IMPRESSION: 1. No acute osseous abnormality  in the thoracic or lumbar spine. 2. Widespread degenerative changes.  Severe spinal stenosis at L1-2. Electronically Signed   By: Sebastian Ache M.D.   On: 11/18/2023 07:31   CT Lumbar Spine Wo Contrast Result Date: 11/18/2023 CLINICAL DATA:  Back trauma, no prior imaging (Age >= 16y). Unwitnessed fall. Back pain. EXAM: CT THORACIC AND LUMBAR SPINE WITHOUT CONTRAST TECHNIQUE: Multidetector CT imaging of the thoracic and lumbar spine was performed without contrast. Multiplanar CT image reconstructions were also generated. RADIATION DOSE REDUCTION: This exam was performed according to the departmental dose-optimization program which includes automated exposure control, adjustment of the mA and/or kV according to patient size and/or use of iterative reconstruction technique. COMPARISON:  CT chest, abdomen, and pelvis 06/10/2022 FINDINGS: CT THORACIC SPINE FINDINGS Alignment: Slight scoliosis. No listhesis. Vertebrae: No acute fracture or suspicious lesion. Paraspinal and other soft tissues: Aortic atherosclerosis. Disc levels: Moderate mid to lower thoracic spondylosis. Solid bridging anterior vertebral osteophyte at T6-7 and T9-10. Facet arthrosis which is most prominent on the right at T10-11 resulting in moderate to severe right neural foraminal  stenosis. CT LUMBAR SPINE FINDINGS Segmentation: Transitional thoracolumbar and lumbosacral anatomy with absent ribs at T12 and with L5 being sacralized. Alignment: Slight lumbar dextroscoliosis. Trace anterolisthesis of L2 on L3 and L3 on L4. Vertebrae: No acute fracture or suspicious lesion. Paraspinal and other soft tissues: Aortic atherosclerosis. Disc levels: Widespread advanced lumbar spondylosis. Severe disc space narrowing at L4-5 and moderate narrowing at L3-4. Widespread advanced facet arthrosis with L1-L5 facet ankylosis on the right. Are laminectomies from L2-3 through L4-5. Severe spinal stenosis and moderate left neural foraminal stenosis at L1-2 due to disc bulging and facet and ligamentum flavum hypertrophy. IMPRESSION: 1. No acute osseous abnormality in the thoracic or lumbar spine. 2. Widespread degenerative changes.  Severe spinal stenosis at L1-2. Electronically Signed   By: Sebastian Ache M.D.   On: 11/18/2023 07:31   CT Head Wo Contrast Result Date: 11/18/2023 CLINICAL DATA:  Head trauma, minor (Age >= 65y); Neck trauma (Age >= 65y). Unwitnessed fall. Bilateral shoulder pain and back pain. EXAM: CT HEAD WITHOUT CONTRAST CT CERVICAL SPINE WITHOUT CONTRAST TECHNIQUE: Multidetector CT imaging of the head and cervical spine was performed following the standard protocol without intravenous contrast. Multiplanar CT image reconstructions of the cervical spine were also generated. RADIATION DOSE REDUCTION: This exam was performed according to the departmental dose-optimization program which includes automated exposure control, adjustment of the mA and/or kV according to patient size and/or use of iterative reconstruction technique. COMPARISON:  CT head and cervical spine 11/14/2023 FINDINGS: CT HEAD FINDINGS Brain: There is no evidence of an acute infarct, intracranial hemorrhage, mass, midline shift, or extra-axial fluid collection. There is moderate cerebral and cerebellar atrophy. Patchy to  confluent hypodensities in the cerebral white matter bilaterally are unchanged and nonspecific but compatible with moderate chronic small vessel ischemic disease. There is likely a chronic lacunar infarct in the right thalamus. Vascular: Calcified atherosclerosis at the skull base. No hyperdense vessel. Skull: No acute fracture or suspicious lesion. Sinuses/Orbits: Minor mucosal thickening in the paranasal sinuses. Right canal wall up mastoidectomy. Bilateral cataract extraction. Other: None. CT CERVICAL SPINE FINDINGS Alignment: Reversal of the normal cervical lordosis. Unchanged trace anterolisthesis of C2 on C3 and C3 on C4. Skull base and vertebrae: No acute fracture or suspicious lesion. Soft tissues and spinal canal: No prevertebral fluid or swelling. No visible canal hematoma. Disc levels: Similar appearance of advanced cervical spondylosis with interbody ankylosis at C5-6 and C6-7  and right facet ankylosis at C3-4. Multilevel neural foraminal stenosis, particularly severe on the right at C3-4 due to uncovertebral and facet spurring. Likely moderate spinal stenosis at C4-5. Moderate ligamentous thickening and calcification posterior to the dens. Upper chest: No acute finding. Other: None. IMPRESSION: 1. No evidence of acute intracranial abnormality or cervical spine fracture. 2. Moderate chronic small vessel ischemic disease and atrophy. Electronically Signed   By: Sebastian Ache M.D.   On: 11/18/2023 07:18   CT Cervical Spine Wo Contrast Result Date: 11/18/2023 CLINICAL DATA:  Head trauma, minor (Age >= 65y); Neck trauma (Age >= 65y). Unwitnessed fall. Bilateral shoulder pain and back pain. EXAM: CT HEAD WITHOUT CONTRAST CT CERVICAL SPINE WITHOUT CONTRAST TECHNIQUE: Multidetector CT imaging of the head and cervical spine was performed following the standard protocol without intravenous contrast. Multiplanar CT image reconstructions of the cervical spine were also generated. RADIATION DOSE REDUCTION: This  exam was performed according to the departmental dose-optimization program which includes automated exposure control, adjustment of the mA and/or kV according to patient size and/or use of iterative reconstruction technique. COMPARISON:  CT head and cervical spine 11/14/2023 FINDINGS: CT HEAD FINDINGS Brain: There is no evidence of an acute infarct, intracranial hemorrhage, mass, midline shift, or extra-axial fluid collection. There is moderate cerebral and cerebellar atrophy. Patchy to confluent hypodensities in the cerebral white matter bilaterally are unchanged and nonspecific but compatible with moderate chronic small vessel ischemic disease. There is likely a chronic lacunar infarct in the right thalamus. Vascular: Calcified atherosclerosis at the skull base. No hyperdense vessel. Skull: No acute fracture or suspicious lesion. Sinuses/Orbits: Minor mucosal thickening in the paranasal sinuses. Right canal wall up mastoidectomy. Bilateral cataract extraction. Other: None. CT CERVICAL SPINE FINDINGS Alignment: Reversal of the normal cervical lordosis. Unchanged trace anterolisthesis of C2 on C3 and C3 on C4. Skull base and vertebrae: No acute fracture or suspicious lesion. Soft tissues and spinal canal: No prevertebral fluid or swelling. No visible canal hematoma. Disc levels: Similar appearance of advanced cervical spondylosis with interbody ankylosis at C5-6 and C6-7 and right facet ankylosis at C3-4. Multilevel neural foraminal stenosis, particularly severe on the right at C3-4 due to uncovertebral and facet spurring. Likely moderate spinal stenosis at C4-5. Moderate ligamentous thickening and calcification posterior to the dens. Upper chest: No acute finding. Other: None. IMPRESSION: 1. No evidence of acute intracranial abnormality or cervical spine fracture. 2. Moderate chronic small vessel ischemic disease and atrophy. Electronically Signed   By: Sebastian Ache M.D.   On: 11/18/2023 07:18   DG Shoulder  Left Result Date: 11/18/2023 CLINICAL DATA:  Fall with bilateral shoulder pain EXAM: LEFT SHOULDER - 3 VIEW COMPARISON:  None Available. FINDINGS: Glenohumeral and acromioclavicular degenerative joint narrowing and spurring. No acute fracture, dislocation, or separation. IMPRESSION: Osteoarthritis without acute finding. Electronically Signed   By: Tiburcio Pea M.D.   On: 11/18/2023 06:40   DG Shoulder Right Result Date: 11/18/2023 CLINICAL DATA:  Bilateral shoulder pain after fall. EXAM: RIGHT SHOULDER - 3 VIEW COMPARISON:  None Available. FINDINGS: No acute fracture or dislocation. Joint space narrowing and degenerative spurring at the acromioclavicular and glenohumeral joints. Generalized calcification above the humeral head. IMPRESSION: No acute finding. Osteoarthritis. Electronically Signed   By: Tiburcio Pea M.D.   On: 11/18/2023 06:39   CT HEAD WO CONTRAST Result Date: 11/14/2023 CLINICAL DATA:  Fall on blood thinners with no visible injury. EXAM: CT HEAD WITHOUT CONTRAST CT CERVICAL SPINE WITHOUT CONTRAST TECHNIQUE: Multidetector CT imaging of the  head and cervical spine was performed following the standard protocol without intravenous contrast. Multiplanar CT image reconstructions of the cervical spine were also generated. RADIATION DOSE REDUCTION: This exam was performed according to the departmental dose-optimization program which includes automated exposure control, adjustment of the mA and/or kV according to patient size and/or use of iterative reconstruction technique. COMPARISON:  Cervical spine CT 10/08/2020 FINDINGS: CT HEAD FINDINGS Brain: No evidence of acute infarction, hemorrhage, hydrocephalus, extra-axial collection or mass lesion/mass effect. Generalized brain atrophy. Periventricular chronic small vessel ischemia. Vascular: No hyperdense vessel or unexpected calcification. Skull: No acute fracture Sinuses/Orbits: No evidence of injury CT CERVICAL SPINE FINDINGS Alignment: No  traumatic malalignment, reversal of cervical lordosis Skull base and vertebrae: No acute fracture. No primary bone lesion or focal pathologic process. Soft tissues and spinal canal: No prevertebral fluid or swelling. No visible canal hematoma. Disc levels: Bulky degenerative spurring with C3-4 facet ankylosis and C5-C7 intervertebral ankylosis. Uncovertebral spurring causes foraminal impingement especially at C2-3 and C3-4. Upper chest: No visible injury IMPRESSION: No evidence of acute intracranial or cervical spine injury. Prominent brain atrophy and cervical spine degeneration. Electronically Signed   By: Tiburcio Pea M.D.   On: 11/14/2023 09:36   CT CERVICAL SPINE WO CONTRAST Result Date: 11/14/2023 CLINICAL DATA:  Fall on blood thinners with no visible injury. EXAM: CT HEAD WITHOUT CONTRAST CT CERVICAL SPINE WITHOUT CONTRAST TECHNIQUE: Multidetector CT imaging of the head and cervical spine was performed following the standard protocol without intravenous contrast. Multiplanar CT image reconstructions of the cervical spine were also generated. RADIATION DOSE REDUCTION: This exam was performed according to the departmental dose-optimization program which includes automated exposure control, adjustment of the mA and/or kV according to patient size and/or use of iterative reconstruction technique. COMPARISON:  Cervical spine CT 10/08/2020 FINDINGS: CT HEAD FINDINGS Brain: No evidence of acute infarction, hemorrhage, hydrocephalus, extra-axial collection or mass lesion/mass effect. Generalized brain atrophy. Periventricular chronic small vessel ischemia. Vascular: No hyperdense vessel or unexpected calcification. Skull: No acute fracture Sinuses/Orbits: No evidence of injury CT CERVICAL SPINE FINDINGS Alignment: No traumatic malalignment, reversal of cervical lordosis Skull base and vertebrae: No acute fracture. No primary bone lesion or focal pathologic process. Soft tissues and spinal canal: No prevertebral  fluid or swelling. No visible canal hematoma. Disc levels: Bulky degenerative spurring with C3-4 facet ankylosis and C5-C7 intervertebral ankylosis. Uncovertebral spurring causes foraminal impingement especially at C2-3 and C3-4. Upper chest: No visible injury IMPRESSION: No evidence of acute intracranial or cervical spine injury. Prominent brain atrophy and cervical spine degeneration. Electronically Signed   By: Tiburcio Pea M.D.   On: 11/14/2023 09:36         Achille Rich, PA-C 11/18/23 1731    Royanne Foots, DO 11/25/23 1152

## 2023-11-18 NOTE — Plan of Care (Signed)

## 2023-11-18 NOTE — Telephone Encounter (Signed)
 Does look like UTI probably caused the issues.  He does not need to make any changes.

## 2023-11-19 DIAGNOSIS — I10 Essential (primary) hypertension: Secondary | ICD-10-CM

## 2023-11-19 DIAGNOSIS — I951 Orthostatic hypotension: Secondary | ICD-10-CM | POA: Diagnosis not present

## 2023-11-19 DIAGNOSIS — E119 Type 2 diabetes mellitus without complications: Secondary | ICD-10-CM | POA: Diagnosis not present

## 2023-11-19 DIAGNOSIS — I739 Peripheral vascular disease, unspecified: Secondary | ICD-10-CM | POA: Diagnosis not present

## 2023-11-19 DIAGNOSIS — R296 Repeated falls: Secondary | ICD-10-CM | POA: Diagnosis not present

## 2023-11-19 DIAGNOSIS — I495 Sick sinus syndrome: Secondary | ICD-10-CM | POA: Diagnosis not present

## 2023-11-19 DIAGNOSIS — I5032 Chronic diastolic (congestive) heart failure: Secondary | ICD-10-CM | POA: Diagnosis not present

## 2023-11-19 DIAGNOSIS — Z950 Presence of cardiac pacemaker: Secondary | ICD-10-CM | POA: Diagnosis not present

## 2023-11-19 DIAGNOSIS — E785 Hyperlipidemia, unspecified: Secondary | ICD-10-CM

## 2023-11-19 DIAGNOSIS — E876 Hypokalemia: Secondary | ICD-10-CM

## 2023-11-19 DIAGNOSIS — I4891 Unspecified atrial fibrillation: Secondary | ICD-10-CM | POA: Diagnosis not present

## 2023-11-19 DIAGNOSIS — R0609 Other forms of dyspnea: Secondary | ICD-10-CM | POA: Diagnosis not present

## 2023-11-19 LAB — COMPREHENSIVE METABOLIC PANEL WITH GFR
ALT: 11 U/L (ref 0–44)
AST: 17 U/L (ref 15–41)
Albumin: 3.2 g/dL — ABNORMAL LOW (ref 3.5–5.0)
Alkaline Phosphatase: 89 U/L (ref 38–126)
Anion gap: 9 (ref 5–15)
BUN: 13 mg/dL (ref 8–23)
CO2: 27 mmol/L (ref 22–32)
Calcium: 9 mg/dL (ref 8.9–10.3)
Chloride: 102 mmol/L (ref 98–111)
Creatinine, Ser: 1.1 mg/dL (ref 0.61–1.24)
GFR, Estimated: >60 mL/min (ref >60)
Glucose, Bld: 99 mg/dL (ref 70–99)
Potassium: 3.5 mmol/L (ref 3.5–5.1)
Sodium: 138 mmol/L (ref 135–145)
Total Bilirubin: 1.8 mg/dL — ABNORMAL HIGH (ref 0.0–1.2)
Total Protein: 6.3 g/dL — ABNORMAL LOW (ref 6.5–8.1)

## 2023-11-19 LAB — URINE CULTURE

## 2023-11-19 LAB — CBC
HCT: 39.2 % (ref 39.0–52.0)
Hemoglobin: 12.9 g/dL — ABNORMAL LOW (ref 13.0–17.0)
MCH: 31.5 pg (ref 26.0–34.0)
MCHC: 32.9 g/dL (ref 30.0–36.0)
MCV: 95.6 fL (ref 80.0–100.0)
Platelets: 154 10*3/uL (ref 150–400)
RBC: 4.1 MIL/uL — ABNORMAL LOW (ref 4.22–5.81)
RDW: 13.2 % (ref 11.5–15.5)
WBC: 6.5 10*3/uL (ref 4.0–10.5)
nRBC: 0 % (ref 0.0–0.2)

## 2023-11-19 LAB — MAGNESIUM: Magnesium: 2 mg/dL (ref 1.7–2.4)

## 2023-11-19 MED ORDER — METOPROLOL SUCCINATE ER 25 MG PO TB24
12.5000 mg | ORAL_TABLET | Freq: Every day | ORAL | Status: DC
  Administered 2023-11-19 – 2023-11-21 (×3): 12.5 mg via ORAL
  Filled 2023-11-19 (×3): qty 1

## 2023-11-19 MED ORDER — POTASSIUM CHLORIDE CRYS ER 10 MEQ PO TBCR
40.0000 meq | EXTENDED_RELEASE_TABLET | Freq: Once | ORAL | Status: AC
  Administered 2023-11-19: 40 meq via ORAL
  Filled 2023-11-19: qty 4

## 2023-11-19 NOTE — [place] FL2 (Signed)
 [place] MEDICAID FL2 LEVEL OF CARE FORM     IDENTIFICATION  Patient Name: Dylan [name] Birthdate: 1925-10-19 Sex: male Admission Date (Current Location): 11/18/2023  [facility] and [place] Number:  Producer, television/film/video and Address:  [facility], [address] [place], [place] [address]      Provider Number: [id number]  Attending Physician Name and Address:  Rodolph Bong, MD  Relative Name and Phone Number:  Derward Marple; Son/HCPOA; [phone]    Current Level of Care: Hospital Recommended Level of Care: Skilled Nursing Facility Prior Approval Number:    Date Approved/Denied:   PASRR Number: [id number]  Discharge Plan: SNF    Current Diagnoses: Patient Active Problem List   Diagnosis Date Noted   Frequent falls 11/18/2023   Hypokalemia 11/08/2023   First degree AV block 09/13/2022   Orthostatic intolerance 09/13/2022   Contusion of toe 06/22/2022   Pressure injury of skin 06/11/2022   Fall at home, initial encounter 06/10/2022   Closed rib fracture 06/10/2022   Rib fracture 06/10/2022   PVD (peripheral vascular disease) (HCC) 12/04/2021   Actinic keratosis 02/06/2021   Cough 02/06/2021   Encounter for general adult medical examination without abnormal findings 02/06/2021   Hyperglycemia 02/06/2021   Neuropathy 02/06/2021   Pure hypercholesterolemia 02/06/2021   Sinusitis 02/06/2021   Sinus node dysfunction (HCC) 01/11/2020   Pacemaker 01/11/2020   Pain due to onychomycosis of toenails of both feet 10/14/2019   Diabetic neuropathy (HCC) 10/14/2019   Type 2 diabetes mellitus with vascular disease (HCC) 10/14/2019   Coagulation disorder (HCC) 10/14/2019   Chronic diastolic CHF (congestive heart failure) (HCC) 07/28/2019   Pneumonia due to COVID-19 virus 07/28/2019   Periorbital hematoma of left eye 10/15/2017   Fall 10/15/2017   HCAP (healthcare-associated pneumonia) 10/14/2017   Bradycardia 10/03/2017   Acute CHF  (congestive heart failure) (HCC) 08/03/2017   Atrial fibrillation (HCC) 07/14/2017   Diastolic dysfunction with acute on chronic heart failure (HCC) 07/14/2017   DOE (dyspnea on exertion) 08/24/2014   Benign essential HTN 08/24/2014   Type 2 diabetes mellitus without complication (HCC) 08/24/2014   Dyslipidemia 08/24/2014    Orientation RESPIRATION BLADDER Height & Weight     Self  O2 (2L Nasal Cannula) Incontinent Weight: 229 lb 15 oz (104.3 kg) Height:  6' (182.9 cm)  BEHAVIORAL SYMPTOMS/MOOD NEUROLOGICAL BOWEL NUTRITION STATUS      Continent Diet (Please see dc summary)  AMBULATORY STATUS COMMUNICATION OF NEEDS Skin   Limited Assist Verbally Normal                       Personal Care Assistance Level of Assistance  Bathing, Feeding, Dressing Bathing Assistance: Limited assistance Feeding assistance: Limited assistance Dressing Assistance: Limited assistance     Functional Limitations Info  Sight, Hearing Sight Info: Impaired (R and L (Eyeglasses)) Hearing Info: Impaired (Hearing Aids)      SPECIAL CARE FACTORS FREQUENCY  PT (By licensed PT), OT (By licensed OT)     PT Frequency: 5x OT Frequency: 5x            Contractures Contractures Info: Not present    Additional Factors Info  Code Status, Allergies Code Status Info: Limited: Do not attempt resuscitation (DNR) -DNR-LIMITED -Do Not Intubate/DN Allergies Info: Keflex (Cephalexin)           Current Medications (11/19/2023):  This is the current hospital active medication list Current Facility-Administered Medications  Medication Dose Route Frequency  Provider Last Rate Last Admin   acetaminophen (TYLENOL) tablet 1,000 mg  1,000 mg Oral TID Rodolph Bong, MD   1,000 mg at 11/19/23 7829   apixaban (ELIQUIS) tablet 5 mg  5 mg Oral BID Rodolph Bong, MD   5 mg at 11/19/23 5621   cefTRIAXone (ROCEPHIN) 2 g in sodium chloride 0.9 % 100 mL IVPB  2 g Intravenous Q24H Rodolph Bong, MD 200 mL/hr  at 11/19/23 1039 2 g at 11/19/23 1039   diphenhydrAMINE (BENADRYL) injection 25 mg  25 mg Intravenous Once PRN Rodolph Bong, MD       docusate sodium (COLACE) capsule 100 mg  100 mg Oral Daily Rodolph Bong, MD   100 mg at 11/19/23 3086   EPINEPHrine (EPI-PEN) injection 0.3 mg  0.3 mg Intramuscular Once PRN Rodolph Bong, MD       hydrALAZINE (APRESOLINE) injection 5 mg  5 mg Intravenous Q6H PRN Rodolph Bong, MD       ibuprofen (ADVIL) tablet 400 mg  400 mg Oral Q6H PRN Rodolph Bong, MD       levalbuterol Pauline Aus) nebulizer solution 0.63 mg  0.63 mg Nebulization Q4H PRN Rodolph Bong, MD       lidocaine (LIDODERM) 5 % 1 patch  1 patch Transdermal Q24H Rodolph Bong, MD   1 patch at 11/19/23 1039   metoprolol succinate (TOPROL-XL) 24 hr tablet 12.5 mg  12.5 mg Oral Daily Rodolph Bong, MD   12.5 mg at 11/19/23 1033   midodrine (PROAMATINE) tablet 15 mg  15 mg Oral TID WC Rodolph Bong, MD   15 mg at 11/19/23 1033   ondansetron (ZOFRAN) tablet 4 mg  4 mg Oral Q6H PRN Rodolph Bong, MD       Or   ondansetron [facility]) injection 4 mg  4 mg Intravenous Q6H PRN Rodolph Bong, MD       oxyCODONE (Oxy IR/ROXICODONE) immediate release tablet 5 mg  5 mg Oral Q4H PRN Rodolph Bong, MD       rosuvastatin (CRESTOR) tablet 10 mg  10 mg Oral Daily Rodolph Bong, MD   10 mg at 11/19/23 0825   senna (SENOKOT) tablet 8.6 mg  1 tablet Oral BID Rodolph Bong, MD   8.6 mg at 11/19/23 0825   sodium chloride flush (NS) 0.9 % injection 3 mL  3 mL Intravenous Q12H Rodolph Bong, MD   3 mL at 11/19/23 5784     Discharge Medications: Please see discharge summary for a list of discharge medications.  Relevant Imaging Results:  Relevant Lab Results:   Additional Information SS#: [id number]  Marliss Coots, LCSW

## 2023-11-19 NOTE — Care Management Obs Status (Signed)
 MEDICARE OBSERVATION STATUS NOTIFICATION   Patient Details  Name: CHANAN DETWILER MRN: [id number] Date of Birth: 11-08-1925   Medicare Observation Status Notification Given:  Yes    Tom-Johnson, Hershal Coria, RN 11/19/2023, 11:10 AM

## 2023-11-19 NOTE — TOC CM/SW Note (Signed)
 Transition of Care [facility]) - Inpatient Brief Assessment   Patient Details  Name: Dylan [name] MRN: [id number] Date of Birth: 07/10/1926  Transition of Care [facility]) CM/SW Contact:    Tom-Johnson, Hershal Coria, RN Phone Number: 11/19/2023, 11:30 AM   Clinical Narrative:  Patient [facility] the ED [facility] after an unwitnessed Fall, was found on the floor by staff with walker bedside him.    CM and LCSW spoke with patient and children at bedside about discharge disposition. CM explained to patient and children that CIR was recommended and assessed patient for possible admit. CIR declined, noted patient does not appear to demonstrate Medical necessity to justify in hospital rehabilitation/CIR. Recommends other rehab venues. Patient and children requested patient go to rehab prior returning to [facility] and consented for referral to be sent out. LCSW informed patient and children that referral will be sent and list of approved facilities will be given for choice to be made and insurance auth done.  CM gave patient's son, Gala Romney a list of SNF around [facility] from [email or web address] to look at options while referral is sent.   Patient not Medically ready for discharge.  LCSW will continue to follow for placement as patient progresses with care towards discharge.            Transition of Care Asessment: Insurance and Status: Insurance coverage has been reviewed Patient has primary care physician: Yes Home environment has been reviewed: Yes- From [facility] Prior level of function:: Modified Independent Prior/Current Home Services: Current home services ([facility]) Social Drivers of Health Review: SDOH reviewed no interventions necessary Readmission risk has been reviewed: Yes Transition of care needs: transition of care needs identified, TOC will continue to follow

## 2023-11-19 NOTE — Plan of Care (Signed)
   Problem: Nutrition: Goal: Adequate nutrition will be maintained Outcome: Progressing   Problem: Safety: Goal: Ability to remain free from injury will improve Outcome: Progressing   Problem: Skin Integrity: Goal: Risk for impaired skin integrity will decrease Outcome: Progressing

## 2023-11-19 NOTE — Progress Notes (Signed)
 PROGRESS NOTE    Dylan [name]  RUE:[id number] DOB: 04-19-1926 DOA: 11/18/2023 PCP: Gaspar Garbe, MD    Chief Complaint  Patient presents with   Fall    Brief Narrative: Patient 89 year old gentleman history of A-fib on chronic anticoagulation with Eliquis, chronic orthostatic hypotension on midodrine, type 2 diabetes, CHF, tachybradycardia syndrome with junctional bradycardia status post PPM, chronic diastolic CHF, CKD 3A with baseline creatinine approximately 1, hyperlipidemia presented to the ED with recurrent falls.  Patient noted to have had multiple falls over the past 3 weeks.  On presentation patient noted to have felt dizzy and lightheaded on standing when trying to go to the bathroom and fell forward into the shower.  Patient denies any syncopal episodes.  Patient admitted for uncontrolled pain and inability to ambulate.  Urinalysis done concerning for UTI.  Patient placed empirically on IV antibiotics.   Assessment & Plan:   Principal Problem:   Frequent falls Active Problems:   DOE (dyspnea on exertion)   Benign essential HTN   Type 2 diabetes mellitus without complication (HCC)   Dyslipidemia   Atrial fibrillation (HCC)   Bradycardia   Chronic diastolic CHF (congestive heart failure) (HCC)   Diabetic neuropathy (HCC)   Sinus node dysfunction (HCC)   Pacemaker   PVD (peripheral vascular disease) (HCC)   Orthostatic intolerance   Hypokalemia  #1 frequent falls -Patient noted with multiple frequent falls over the past 3 weeks, per son patient has had 8 falls. -Patient with history of chronic orthostatic hypotension on midodrine 15 mg 3 times daily, patient noted to be on diuretics of Lasix 60 every morning and 40 nightly, also noted to be on Toprol-XL and per son awaiting to hear back from cardiology about dose reduction. -Patient noted on presentation with complaints of dizziness on standing from supine position prior to fall and likely consistent with  orthostasis.  BP noted to go as low as 95/64 on presentation to the ED. -Urinalysis also concerning for UTI. -Urine cultures with multiple species present. -Continue home regimen midodrine 15 mg 3 times daily. -Continue to hold diuretics.   -Resume half home dose Toprol-XL at 12.5 mg daily.  -Saline lock IV fluids. -TED hose. -It is noted on epic that cardiologist was recommending a decrease of patient's Toprol-XL to 12.5 mg daily in addition to compression stockings prior to admission. -PT/OT.   2.  Chronic A-fib/tachybradycardia syndrome status post PPM -Patient noted with history of A-fib on chronic anticoagulation. -Patient noted to be on Toprol-XL 25 mg daily which was to be decreased to 12.5 mg daily per cardiologist note on epic due to patient's multiple falls. -Will resume Toprol-XL at half home dose and uptitrate back to full home dose as BP able to tolerate. -Continue Eliquis as patient in a controlled setting at this time and will need to discuss with patient's cardiologist as to whether patient is still a candidate for anticoagulation in light of recent multiple falls.   3.  UTI -Urine cultures with multiple species present.   -Continue empiric IV Rocephin and treat for total of 3 days.     4.  Chronic orthostatic hypotension -Continue home regimen midodrine 15 mg 3 times daily. -Placed on IV fluids on admission, BP improved and currently stable, discontinue IV fluids.  -TED hose.   5.  Dehydration -Hydrated with IV fluids.   6.  Chronic diastolic CHF -Patient more on the dry side and not volume overloaded on presentation. -Patient presented with frequent falls, blood  pressure noted to be borderline, patient with chronic orthostatic hypotension. -Patient hydrated gently with IV fluids.   -Continue to hold diuretics and if remains euvolemic and blood pressure remained stable could resume at half home dose in the next 24 to 48 hours.     7.  Low back pain -Secondary to  falls. -CT of the T and L-spine with no acute abnormalities. -Continue Lidoderm patch, scheduled Tylenol 1000 mg 3 times daily, oxycodone as needed. -PT/OT.   8.  Diabetes mellitus type 2 -Not on diabetic medications per med rec. -Last hemoglobin A1c 5.9 (07/29/2019) -Repeat hemoglobin A1c.   9.  Chronic dyspnea on exertion/chronic respiratory failure -Per son patient on 2 L home O2 during the daytime and with ambulation and not on O2 at bedtime. -Stable.      DVT prophylaxis: Eliquis Code Status: DNR Family Communication: Updated patient and daughter at bedside. Disposition: Likely needs SNF.  Status is: Observation The patient remains OBS appropriate and will d/c before 2 midnights.   Consultants:  None  Procedures:  CT head CT C-spine 11/18/2023 CT T/L-spine 11/18/2023 Plain films of bilateral shoulders 11/18/2023  Antimicrobials:  Anti-infectives (From admission, onward)    Start     Dose/Rate Route Frequency Ordered Stop   11/18/23 1200  cefTRIAXone (ROCEPHIN) 2 g in sodium chloride 0.9 % 100 mL IVPB        2 g 200 mL/hr over 30 Minutes Intravenous Every 24 hours 11/18/23 1101     11/18/23 1015  cefTRIAXone (ROCEPHIN) 1 g in sodium chloride 0.9 % 100 mL IVPB  Status:  Discontinued        1 g 200 mL/hr over 30 Minutes Intravenous  Once 11/18/23 1001 11/18/23 1101         Subjective: Patient sleeping but arousable.  Overall feeling a little bit better.  Still with back pain but improved since admission.  Denies any chest pain or shortness of breath.  Daughter at bedside.  Objective: Vitals:   11/18/23 1617 11/18/23 2137 11/19/23 0603 11/19/23 0800  BP: 125/75 133/65 130/81 (!) 140/88  Pulse: 67 65 74 96  Resp: 18 16 14    Temp: 98.3 F (36.8 C) 98 F (36.7 C) 97.6 F (36.4 C) 97.6 F (36.4 C)  TempSrc:  Oral Oral Oral  SpO2: 98% 97% 97% 99%  Weight:      Height:        Intake/Output Summary (Last 24 hours) at 11/19/2023 1607 Last data filed at 11/19/2023  0604 Gross per 24 hour  Intake 120 ml  Output 1600 ml  Net -1480 ml   Filed Weights   11/18/23 0551  Weight: 104.3 kg    Examination:  General exam: Appears calm and comfortable  Respiratory system: Clear to auscultation. Respiratory effort normal. Cardiovascular system: Irregularly irregular.  No murmurs rubs or gallops.  No JVD.  No pitting lower extremity edema.   Gastrointestinal system: Abdomen is nondistended, soft and nontender. No organomegaly or masses felt. Normal bowel sounds heard. Central nervous system: Alert and oriented. No focal neurological deficits. Extremities: Symmetric 5 x 5 power. Skin: No rashes, lesions or ulcers Psychiatry: Judgement and insight appear normal. Mood & affect appropriate.     Data Reviewed: I have personally reviewed following labs and imaging studies  CBC: Recent Labs  Lab 11/14/23 0846 11/18/23 0607 11/19/23 0435  WBC 10.5 8.8 6.5  NEUTROABS  --  6.2  --   HGB 14.9 13.7 12.9*  HCT 44.9 41.5 39.2  MCV 97.0 97.2 95.6  PLT 171 170 154    Basic Metabolic Panel: Recent Labs  Lab 11/14/23 0846 11/18/23 0607 11/19/23 0435  NA 138 139 138  K 3.6 3.6 3.5  CL 96* 100 102  CO2 28 27 27   GLUCOSE 114* 108* 99  BUN 19 16 13   CREATININE 1.49* 1.35* 1.10  CALCIUM 9.7 9.4 9.0  MG  --  1.9 2.0    GFR: Estimated Creatinine Clearance: 50.2 mL/min (by C-G formula based on SCr of 1.1 mg/dL).  Liver Function Tests: Recent Labs  Lab 11/14/23 0846 11/19/23 0435  AST 21 17  ALT 14 11  ALKPHOS 72 89  BILITOT 2.3* 1.8*  PROT 7.3 6.3*  ALBUMIN 4.0 3.2*    CBG: No results for input(s): "GLUCAP" in the last 168 hours.   Recent Results (from the past 240 hours)  Urine Culture     Status: Abnormal   Collection Time: 11/18/23  9:53 AM   Specimen: Urine, Clean Catch  Result Value Ref Range Status   Specimen Description URINE, CLEAN CATCH  Final   Special Requests   Final    NONE Performed at [facility], [address], [place], [place] [address]    Culture MULTIPLE SPECIES PRESENT, SUGGEST RECOLLECTION (A)  Final   Report Status 11/19/2023 FINAL  Final         Radiology Studies: CT Thoracic Spine Wo Contrast Result Date: 11/18/2023 CLINICAL DATA:  Back trauma, no prior imaging (Age >= 16y). Unwitnessed fall. Back pain. EXAM: CT THORACIC AND LUMBAR SPINE WITHOUT CONTRAST TECHNIQUE: Multidetector CT imaging of the thoracic and lumbar spine was performed without contrast. Multiplanar CT image reconstructions were also generated. RADIATION DOSE REDUCTION: This exam was performed according to the departmental dose-optimization program which includes automated exposure control, adjustment of the mA and/or kV according to patient size and/or use of iterative reconstruction technique. COMPARISON:  CT chest, abdomen, and pelvis 06/10/2022 FINDINGS: CT THORACIC SPINE FINDINGS Alignment: Slight scoliosis. No listhesis. Vertebrae: No acute fracture or suspicious lesion. Paraspinal and other soft tissues: Aortic atherosclerosis. Disc levels: Moderate mid to lower thoracic spondylosis. Solid bridging anterior vertebral osteophyte at T6-7 and T9-10. Facet arthrosis which is most prominent on the right at T10-11 resulting in moderate to severe right neural foraminal stenosis. CT LUMBAR SPINE FINDINGS Segmentation: Transitional thoracolumbar and lumbosacral anatomy with absent ribs at T12 and with L5 being sacralized. Alignment: Slight lumbar dextroscoliosis. Trace anterolisthesis of L2 on L3 and L3 on L4. Vertebrae: No acute fracture or suspicious lesion. Paraspinal and other soft tissues: Aortic atherosclerosis. Disc levels: Widespread advanced lumbar spondylosis. Severe disc space narrowing at L4-5 and moderate narrowing at L3-4. Widespread advanced facet arthrosis with L1-L5 facet ankylosis on the right. Are laminectomies from L2-3 through L4-5. Severe spinal stenosis and moderate left neural foraminal stenosis at L1-2 due to  disc bulging and facet and ligamentum flavum hypertrophy. IMPRESSION: 1. No acute osseous abnormality in the thoracic or lumbar spine. 2. Widespread degenerative changes.  Severe spinal stenosis at L1-2. Electronically Signed   By: Sebastian Ache M.D.   On: 11/18/2023 07:31   CT Lumbar Spine Wo Contrast Result Date: 11/18/2023 CLINICAL DATA:  Back trauma, no prior imaging (Age >= 16y). Unwitnessed fall. Back pain. EXAM: CT THORACIC AND LUMBAR SPINE WITHOUT CONTRAST TECHNIQUE: Multidetector CT imaging of the thoracic and lumbar spine was performed without contrast. Multiplanar CT image reconstructions were also generated. RADIATION DOSE REDUCTION: This exam was performed according to  the departmental dose-optimization program which includes automated exposure control, adjustment of the mA and/or kV according to patient size and/or use of iterative reconstruction technique. COMPARISON:  CT chest, abdomen, and pelvis 06/10/2022 FINDINGS: CT THORACIC SPINE FINDINGS Alignment: Slight scoliosis. No listhesis. Vertebrae: No acute fracture or suspicious lesion. Paraspinal and other soft tissues: Aortic atherosclerosis. Disc levels: Moderate mid to lower thoracic spondylosis. Solid bridging anterior vertebral osteophyte at T6-7 and T9-10. Facet arthrosis which is most prominent on the right at T10-11 resulting in moderate to severe right neural foraminal stenosis. CT LUMBAR SPINE FINDINGS Segmentation: Transitional thoracolumbar and lumbosacral anatomy with absent ribs at T12 and with L5 being sacralized. Alignment: Slight lumbar dextroscoliosis. Trace anterolisthesis of L2 on L3 and L3 on L4. Vertebrae: No acute fracture or suspicious lesion. Paraspinal and other soft tissues: Aortic atherosclerosis. Disc levels: Widespread advanced lumbar spondylosis. Severe disc space narrowing at L4-5 and moderate narrowing at L3-4. Widespread advanced facet arthrosis with L1-L5 facet ankylosis on the right. Are laminectomies from L2-3  through L4-5. Severe spinal stenosis and moderate left neural foraminal stenosis at L1-2 due to disc bulging and facet and ligamentum flavum hypertrophy. IMPRESSION: 1. No acute osseous abnormality in the thoracic or lumbar spine. 2. Widespread degenerative changes.  Severe spinal stenosis at L1-2. Electronically Signed   By: Sebastian Ache M.D.   On: 11/18/2023 07:31   CT Head Wo Contrast Result Date: 11/18/2023 CLINICAL DATA:  Head trauma, minor (Age >= 65y); Neck trauma (Age >= 65y). Unwitnessed fall. Bilateral shoulder pain and back pain. EXAM: CT HEAD WITHOUT CONTRAST CT CERVICAL SPINE WITHOUT CONTRAST TECHNIQUE: Multidetector CT imaging of the head and cervical spine was performed following the standard protocol without intravenous contrast. Multiplanar CT image reconstructions of the cervical spine were also generated. RADIATION DOSE REDUCTION: This exam was performed according to the departmental dose-optimization program which includes automated exposure control, adjustment of the mA and/or kV according to patient size and/or use of iterative reconstruction technique. COMPARISON:  CT head and cervical spine 11/14/2023 FINDINGS: CT HEAD FINDINGS Brain: There is no evidence of an acute infarct, intracranial hemorrhage, mass, midline shift, or extra-axial fluid collection. There is moderate cerebral and cerebellar atrophy. Patchy to confluent hypodensities in the cerebral white matter bilaterally are unchanged and nonspecific but compatible with moderate chronic small vessel ischemic disease. There is likely a chronic lacunar infarct in the right thalamus. Vascular: Calcified atherosclerosis at the skull base. No hyperdense vessel. Skull: No acute fracture or suspicious lesion. Sinuses/Orbits: Minor mucosal thickening in the paranasal sinuses. Right canal wall up mastoidectomy. Bilateral cataract extraction. Other: None. CT CERVICAL SPINE FINDINGS Alignment: Reversal of the normal cervical lordosis. Unchanged  trace anterolisthesis of C2 on C3 and C3 on C4. Skull base and vertebrae: No acute fracture or suspicious lesion. Soft tissues and spinal canal: No prevertebral fluid or swelling. No visible canal hematoma. Disc levels: Similar appearance of advanced cervical spondylosis with interbody ankylosis at C5-6 and C6-7 and right facet ankylosis at C3-4. Multilevel neural foraminal stenosis, particularly severe on the right at C3-4 due to uncovertebral and facet spurring. Likely moderate spinal stenosis at C4-5. Moderate ligamentous thickening and calcification posterior to the dens. Upper chest: No acute finding. Other: None. IMPRESSION: 1. No evidence of acute intracranial abnormality or cervical spine fracture. 2. Moderate chronic small vessel ischemic disease and atrophy. Electronically Signed   By: Sebastian Ache M.D.   On: 11/18/2023 07:18   CT Cervical Spine Wo Contrast Result Date: 11/18/2023 CLINICAL DATA:  Head trauma, minor (Age >= 65y); Neck trauma (Age >= 65y). Unwitnessed fall. Bilateral shoulder pain and back pain. EXAM: CT HEAD WITHOUT CONTRAST CT CERVICAL SPINE WITHOUT CONTRAST TECHNIQUE: Multidetector CT imaging of the head and cervical spine was performed following the standard protocol without intravenous contrast. Multiplanar CT image reconstructions of the cervical spine were also generated. RADIATION DOSE REDUCTION: This exam was performed according to the departmental dose-optimization program which includes automated exposure control, adjustment of the mA and/or kV according to patient size and/or use of iterative reconstruction technique. COMPARISON:  CT head and cervical spine 11/14/2023 FINDINGS: CT HEAD FINDINGS Brain: There is no evidence of an acute infarct, intracranial hemorrhage, mass, midline shift, or extra-axial fluid collection. There is moderate cerebral and cerebellar atrophy. Patchy to confluent hypodensities in the cerebral white matter bilaterally are unchanged and nonspecific but  compatible with moderate chronic small vessel ischemic disease. There is likely a chronic lacunar infarct in the right thalamus. Vascular: Calcified atherosclerosis at the skull base. No hyperdense vessel. Skull: No acute fracture or suspicious lesion. Sinuses/Orbits: Minor mucosal thickening in the paranasal sinuses. Right canal wall up mastoidectomy. Bilateral cataract extraction. Other: None. CT CERVICAL SPINE FINDINGS Alignment: Reversal of the normal cervical lordosis. Unchanged trace anterolisthesis of C2 on C3 and C3 on C4. Skull base and vertebrae: No acute fracture or suspicious lesion. Soft tissues and spinal canal: No prevertebral fluid or swelling. No visible canal hematoma. Disc levels: Similar appearance of advanced cervical spondylosis with interbody ankylosis at C5-6 and C6-7 and right facet ankylosis at C3-4. Multilevel neural foraminal stenosis, particularly severe on the right at C3-4 due to uncovertebral and facet spurring. Likely moderate spinal stenosis at C4-5. Moderate ligamentous thickening and calcification posterior to the dens. Upper chest: No acute finding. Other: None. IMPRESSION: 1. No evidence of acute intracranial abnormality or cervical spine fracture. 2. Moderate chronic small vessel ischemic disease and atrophy. Electronically Signed   By: Sebastian Ache M.D.   On: 11/18/2023 07:18   DG Shoulder Left Result Date: 11/18/2023 CLINICAL DATA:  Fall with bilateral shoulder pain EXAM: LEFT SHOULDER - 3 VIEW COMPARISON:  None Available. FINDINGS: Glenohumeral and acromioclavicular degenerative joint narrowing and spurring. No acute fracture, dislocation, or separation. IMPRESSION: Osteoarthritis without acute finding. Electronically Signed   By: Tiburcio Pea M.D.   On: 11/18/2023 06:40   DG Shoulder Right Result Date: 11/18/2023 CLINICAL DATA:  Bilateral shoulder pain after fall. EXAM: RIGHT SHOULDER - 3 VIEW COMPARISON:  None Available. FINDINGS: No acute fracture or dislocation.  Joint space narrowing and degenerative spurring at the acromioclavicular and glenohumeral joints. Generalized calcification above the humeral head. IMPRESSION: No acute finding. Osteoarthritis. Electronically Signed   By: Tiburcio Pea M.D.   On: 11/18/2023 06:39        Scheduled Meds:  acetaminophen  1,000 mg Oral TID   apixaban  5 mg Oral BID   docusate sodium  100 mg Oral Daily   lidocaine  1 patch Transdermal Q24H   metoprolol succinate  12.5 mg Oral Daily   midodrine  15 mg Oral TID WC   rosuvastatin  10 mg Oral Daily   senna  1 tablet Oral BID   sodium chloride flush  3 mL Intravenous Q12H   Continuous Infusions:  cefTRIAXone (ROCEPHIN)  IV 2 g (11/19/23 1039)     LOS: 0 days    Time spent: 40 minutes    Ramiro Harvest, MD [facility]   To contact the attending provider between 7A-7P  or the covering provider during after hours 7P-7A, please log into the web site [email or web address] and access using universal [place] password for that web site. If you do not have the password, please call the hospital operator.  11/19/2023, 4:07 PM

## 2023-11-19 NOTE — Evaluation (Signed)
 Clinical/Bedside Swallow Evaluation Patient Details  Name: Dylan [name] MRN: [id number] Date of Birth: March 16, 1926  Today's Date: 11/19/2023 Time: SLP Start Time (ACUTE ONLY): 1405 SLP Stop Time (ACUTE ONLY): 1430 SLP Time Calculation (min) (ACUTE ONLY): 25 min  Past Medical History:  Past Medical History:  Diagnosis Date   COVID-19    Diabetes mellitus without complication (HCC)    Dyspnea    History of hiatal hernia    Hyperglycemia    Hyperlipidemia    Hypertension    Lung nodule    Myocardial infarction (HCC)    Presence of permanent cardiac pacemaker    Past Surgical History:  Past Surgical History:  Procedure Laterality Date   APPENDECTOMY  1956   BACK SURGERY  2008   CARDIAC CATHETERIZATION  1995   negative results   CARDIOVERSION N/A 08/05/2017   Procedure: CARDIOVERSION;  Surgeon: Laurey Morale, MD;  Location: [facility] ENDOSCOPY;  Service: Cardiovascular;  Laterality: N/A;   CARDIOVERSION N/A 09/12/2017   Procedure: CARDIOVERSION;  Surgeon: Laurey Morale, MD;  Location: [facility] ENDOSCOPY;  Service: Cardiovascular;  Laterality: N/A;   CARDIOVERSION N/A 09/15/2021   Procedure: CARDIOVERSION;  Surgeon: Laurey Morale, MD;  Location: [facility] ENDOSCOPY;  Service: Cardiovascular;  Laterality: N/A;   CARDIOVERSION N/A 10/12/2021   Procedure: CARDIOVERSION;  Surgeon: Laurey Morale, MD;  Location: [facility] ENDOSCOPY;  Service: Cardiovascular;  Laterality: N/A;   COLON SURGERY  07/09/12   HERNIA REPAIR  1952   HOT HEMOSTASIS  01/08/2012   Procedure: HOT HEMOSTASIS (ARGON PLASMA COAGULATION/BICAP);  Surgeon: Willis Modena, MD;  Location: [facility] ENDOSCOPY;  Service: Endoscopy;  Laterality: N/A;   LAPAROSCOPIC ILEOCECECTOMY  07/09/2012   Procedure: LAPAROSCOPIC ILEOCECECTOMY;  Surgeon: Mariella Saa, MD;  Location: [facility] ORS;  Service: General;  Laterality: N/A;  Laparoscopic Ileocecectomy   PACEMAKER IMPLANT N/A 10/05/2017   Procedure: PACEMAKER IMPLANT;  Surgeon: Duke Salvia, MD;   Location: [facility] INVASIVE CV LAB;  Service: Cardiovascular;  Laterality: N/A;   REPLACEMENT TOTAL KNEE  2010   TEE WITHOUT CARDIOVERSION N/A 08/05/2017   Procedure: TRANSESOPHAGEAL ECHOCARDIOGRAM (TEE);  Surgeon: Laurey Morale, MD;  Location: [facility] ENDOSCOPY;  Service: Cardiovascular;  Laterality: N/A;   TEE WITHOUT CARDIOVERSION N/A 09/12/2017   Procedure: TRANSESOPHAGEAL ECHOCARDIOGRAM (TEE);  Surgeon: Laurey Morale, MD;  Location: [facility] ENDOSCOPY;  Service: Cardiovascular;  Laterality: N/A;   HPI:  Patient is a 89 y.o. male with PMH: a-fib, orthostatic hypotension, DM, ,HTN, CHF, CHF, CKD stage IIIa. He resides at [facility]. He presented to the hospital on 11/18/23 following a fall. Per son's report, patient has had approximately 8 falls in the past three weeks. CT head CT C-spine with no acute intracranial abnormality or cervical spine fracture.  SLP ordered to evaluate swallowing after daughter and son both witnessed him to cough after drinking liquids and while eating.    Assessment / Plan / Recommendation  Clinical Impression  Patient presents with clinical s/s of dysphagia as per this bedside swallow evaluation. Per son, he and his sister both witnessed patient to have quite a lot of coughing with PO's today. Patient himself suspects he drinks to fast and his son did add that he eats quickly too. SLP assessed his swallowing at bedside with thin liquids, nectar thick liquids, mechanical soft solids (graham cracker). He exhibited fairly immediate cough on 75% of cup sips of thin liquids, no immediate or delayed cough with cup sips of nectar thick liquids. Mastication and swallowing of solid  was [facility]. SLP discussed recommendations with patient and son, which is to try nectar thick liquids for tonight and tomorrow morning, for SLP to return likely next date to determine if thickened liquids seems to help reduce or eliminate his coughing and likely MBS to r/o aspiration. SLP Visit Diagnosis:  Dysphagia, unspecified (R13.10)    Aspiration Risk  Mild aspiration risk    Diet Recommendation Nectar-thick liquid;Regular    Medication Administration: Whole meds with puree Supervision: Patient able to self feed;Intermittent supervision to cue for compensatory strategies Compensations: Slow rate;Small sips/bites Postural Changes: Seated upright at 90 degrees    Other  Recommendations Oral Care Recommendations: Oral care BID    Recommendations for follow up therapy are one component of a multi-disciplinary discharge planning process, led by the attending physician.  Recommendations may be updated based on patient status, additional functional criteria and insurance authorization.  Follow up Recommendations Other (comment) (TBD after MBS)      Assistance Recommended at Discharge    Functional Status Assessment Patient has had a recent decline in their functional status and demonstrates the ability to make significant improvements in function in a reasonable and predictable amount of time.  Frequency and Duration min 1 x/week  1 week       Prognosis Prognosis for improved oropharyngeal function: Good      Swallow Study   General Date of Onset: 11/19/23 HPI: Patient is a 89 y.o. male with PMH: a-fib, orthostatic hypotension, DM, ,HTN, CHF, CHF, CKD stage IIIa. He resides at [facility]. He presented to the hospital on 11/18/23 following a fall. Per son's report, patient has had approximately 8 falls in the past three weeks. CT head CT C-spine with no acute intracranial abnormality or cervical spine fracture.  SLP ordered to evaluate swallowing after daughter and son both witnessed him to cough after drinking liquids and while eating. Type of Study: Bedside Swallow Evaluation Previous Swallow Assessment: none found Diet Prior to this Study: Regular;Thin liquids (Level 0) Temperature Spikes Noted: No Respiratory Status: Room air History of Recent Intubation:  No Behavior/Cognition: Cooperative;Alert;Pleasant mood Oral Cavity Assessment: Within Functional Limits Oral Care Completed by SLP: No Oral Cavity - Dentition: Dentures, top Vision: Functional for self-feeding Self-Feeding Abilities: Able to feed self Patient Positioning: Upright in bed Baseline Vocal Quality: Normal Volitional Cough: Strong Volitional Swallow: Able to elicit    Oral/Motor/Sensory Function Overall Oral Motor/Sensory Function: Within functional limits   Ice Chips     Thin Liquid Thin Liquid: Impaired Presentation: Cup;Self Fed Pharyngeal  Phase Impairments: Cough - Immediate    Nectar Thick Nectar Thick Liquid: Within functional limits Presentation: Cup;Self Fed   Honey Thick     Puree Puree: Not tested   Solid     Solid: Within functional limits Presentation: Self Fed      Angela Nevin, MA, CCC-SLP Speech Therapy

## 2023-11-19 NOTE — Progress Notes (Signed)
 Patient noted to have a wet cough after attempting to eat a piece of chicken. Food removed from patient. Patient sat up right and oral suction performed with thick secretions noted. Patient remains on 2L [place]. Requested for MD to change patient's diet to puree until completion of barium swallow tomorrow.

## 2023-11-19 NOTE — TOC Initial Note (Signed)
 Transition of Care [facility]) - Initial/Assessment Note    Patient Details  Name: Dylan [name] MRN: [id number] Date of Birth: 1925/12/31  [facility]) CM/SW Contact:    Marliss Coots, LCSW Phone Number: 11/19/2023, 3:42 PM  Clinical Narrative:                  3:42 PM CSW and unit RNCM selves and roles to patient at bedside. Patient's son/HCPOa, Doug, (patient currently disoriented x3) and patient's daughter-in-law were also present at bedside. CSW and RNCM informed patient and family of CIR denial and SNF process. Gala Romney was agreeable to patient discharging to SNF (preference in [facility] SNFs, especially [facility] and [facility]).  Expected Discharge Plan: Skilled Nursing Facility Barriers to Discharge: Continued Medical Work up, SNF Pending bed offer   Patient Goals and CMS Choice Patient states their goals for this hospitalization and ongoing recovery are:: SNF          Expected Discharge Plan and Services In-house Referral: Clinical Social Work   Post Acute Care Choice: Skilled Nursing Facility Living arrangements for the past 2 months: Assisted Living Facility                                      Prior Living Arrangements/Services Living arrangements for the past 2 months: Assisted Living Facility Lives with:: Facility Resident, Self, Spouse Patient language and need for interpreter reviewed:: Yes        Need for Family Participation in Patient Care: Yes (Comment) Care giver support system in place?: Yes (comment)   Criminal Activity/Legal Involvement Pertinent to Current Situation/Hospitalization: No - Comment as needed  Activities of Daily Living   ADL Screening (condition at time of admission) Independently performs ADLs?: No Does the patient have a NEW difficulty with bathing/dressing/toileting/self-feeding that is expected to last >3 days?: Yes (Initiates electronic notice to provider for possible OT consult) Does the patient have a NEW  difficulty with getting in/out of bed, walking, or climbing stairs that is expected to last >3 days?: Yes (Initiates electronic notice to provider for possible PT consult) Does the patient have a NEW difficulty with communication that is expected to last >3 days?: No Is the patient deaf or have difficulty hearing?: Yes Does the patient have difficulty seeing, even when wearing glasses/contacts?: Yes Does the patient have difficulty concentrating, remembering, or making decisions?: Yes  Permission Sought/Granted Permission sought to share information with : Facility Medical sales representative, Family Supports Permission granted to share information with : Yes, Verbal Permission Granted  Share Information with NAME: Daryn Hicks  Permission granted to share info w AGENCY: SNF  Permission granted to share info w Relationship: Son/HCPOA  Permission granted to share info w Contact Information: [phone]  Emotional Assessment Appearance:: Appears stated age Attitude/Demeanor/Rapport: Unable to Assess Affect (typically observed): Unable to Assess Orientation: : Oriented to Self Alcohol / Substance Use: Not Applicable Psych Involvement: No (comment)  Admission diagnosis:  Frequent falls [R29.6] Fall, initial encounter [W19.XXXA] Patient Active Problem List   Diagnosis Date Noted   Frequent falls 11/18/2023   Hypokalemia 11/08/2023   First degree AV block 09/13/2022   Orthostatic intolerance 09/13/2022   Contusion of toe 06/22/2022   Pressure injury of skin 06/11/2022   Fall at home, initial encounter 06/10/2022   Closed rib fracture 06/10/2022   Rib fracture 06/10/2022   PVD (peripheral vascular disease) (HCC) 12/04/2021   Actinic  keratosis 02/06/2021   Cough 02/06/2021   Encounter for general adult medical examination without abnormal findings 02/06/2021   Hyperglycemia 02/06/2021   Neuropathy 02/06/2021   Pure hypercholesterolemia 02/06/2021   Sinusitis 02/06/2021   Sinus node  dysfunction (HCC) 01/11/2020   Pacemaker 01/11/2020   Pain due to onychomycosis of toenails of both feet 10/14/2019   Diabetic neuropathy (HCC) 10/14/2019   Type 2 diabetes mellitus with vascular disease (HCC) 10/14/2019   Coagulation disorder (HCC) 10/14/2019   Chronic diastolic CHF (congestive heart failure) (HCC) 07/28/2019   Pneumonia due to COVID-19 virus 07/28/2019   Periorbital hematoma of left eye 10/15/2017   Fall 10/15/2017   HCAP (healthcare-associated pneumonia) 10/14/2017   Bradycardia 10/03/2017   Acute CHF (congestive heart failure) (HCC) 08/03/2017   Atrial fibrillation (HCC) 07/14/2017   Diastolic dysfunction with acute on chronic heart failure (HCC) 07/14/2017   DOE (dyspnea on exertion) 08/24/2014   Benign essential HTN 08/24/2014   Type 2 diabetes mellitus without complication (HCC) 08/24/2014   Dyslipidemia 08/24/2014   PCP:  Gaspar Garbe, MD Pharmacy:   [facility] [address], [place] - [address] [place] [address] Phone: [phone] Fax: [phone]  [facility] - [place], [place] - [address] [facility] AT Portal to Registered [facility] [address] [place] [address] Phone: [phone] Fax: [phone]     Social Drivers of Health (SDOH) Social History: SDOH Screenings   Food Insecurity: No Food Insecurity (11/18/2023)  Housing: Low Risk  (11/18/2023)  Transportation Needs: No Transportation Needs (11/18/2023)  Utilities: Not At Risk (11/18/2023)  Social Connections: Patient Declined (11/18/2023)  Tobacco Use: Medium Risk (11/18/2023)   SDOH Interventions: Transportation Interventions: Inpatient TOC   Readmission Risk Interventions     No data to display

## 2023-11-20 ENCOUNTER — Observation Stay (HOSPITAL_COMMUNITY)

## 2023-11-20 DIAGNOSIS — I495 Sick sinus syndrome: Secondary | ICD-10-CM | POA: Diagnosis not present

## 2023-11-20 DIAGNOSIS — N1831 Chronic kidney disease, stage 3a: Secondary | ICD-10-CM | POA: Diagnosis not present

## 2023-11-20 DIAGNOSIS — Z7401 Bed confinement status: Secondary | ICD-10-CM | POA: Diagnosis not present

## 2023-11-20 DIAGNOSIS — W19XXXA Unspecified fall, initial encounter: Secondary | ICD-10-CM

## 2023-11-20 DIAGNOSIS — E876 Hypokalemia: Secondary | ICD-10-CM | POA: Diagnosis not present

## 2023-11-20 DIAGNOSIS — Z7901 Long term (current) use of anticoagulants: Secondary | ICD-10-CM | POA: Diagnosis not present

## 2023-11-20 DIAGNOSIS — N3000 Acute cystitis without hematuria: Secondary | ICD-10-CM

## 2023-11-20 DIAGNOSIS — E1151 Type 2 diabetes mellitus with diabetic peripheral angiopathy without gangrene: Secondary | ICD-10-CM | POA: Diagnosis not present

## 2023-11-20 DIAGNOSIS — I4891 Unspecified atrial fibrillation: Secondary | ICD-10-CM | POA: Diagnosis not present

## 2023-11-20 DIAGNOSIS — E1140 Type 2 diabetes mellitus with diabetic neuropathy, unspecified: Secondary | ICD-10-CM | POA: Diagnosis not present

## 2023-11-20 DIAGNOSIS — Z66 Do not resuscitate: Secondary | ICD-10-CM | POA: Diagnosis not present

## 2023-11-20 DIAGNOSIS — R531 Weakness: Secondary | ICD-10-CM | POA: Diagnosis not present

## 2023-11-20 DIAGNOSIS — I5032 Chronic diastolic (congestive) heart failure: Secondary | ICD-10-CM | POA: Diagnosis not present

## 2023-11-20 DIAGNOSIS — M545 Low back pain, unspecified: Secondary | ICD-10-CM | POA: Diagnosis not present

## 2023-11-20 DIAGNOSIS — Z8616 Personal history of COVID-19: Secondary | ICD-10-CM | POA: Diagnosis not present

## 2023-11-20 DIAGNOSIS — I10 Essential (primary) hypertension: Secondary | ICD-10-CM | POA: Diagnosis not present

## 2023-11-20 DIAGNOSIS — I951 Orthostatic hypotension: Secondary | ICD-10-CM | POA: Diagnosis not present

## 2023-11-20 DIAGNOSIS — Z9181 History of falling: Secondary | ICD-10-CM | POA: Diagnosis not present

## 2023-11-20 DIAGNOSIS — M546 Pain in thoracic spine: Secondary | ICD-10-CM | POA: Diagnosis not present

## 2023-11-20 DIAGNOSIS — Z950 Presence of cardiac pacemaker: Secondary | ICD-10-CM | POA: Diagnosis not present

## 2023-11-20 DIAGNOSIS — N390 Urinary tract infection, site not specified: Secondary | ICD-10-CM | POA: Diagnosis not present

## 2023-11-20 DIAGNOSIS — I130 Hypertensive heart and chronic kidney disease with heart failure and stage 1 through stage 4 chronic kidney disease, or unspecified chronic kidney disease: Secondary | ICD-10-CM | POA: Diagnosis not present

## 2023-11-20 DIAGNOSIS — N3001 Acute cystitis with hematuria: Secondary | ICD-10-CM | POA: Diagnosis not present

## 2023-11-20 DIAGNOSIS — I739 Peripheral vascular disease, unspecified: Secondary | ICD-10-CM | POA: Diagnosis not present

## 2023-11-20 DIAGNOSIS — E1122 Type 2 diabetes mellitus with diabetic chronic kidney disease: Secondary | ICD-10-CM | POA: Diagnosis not present

## 2023-11-20 DIAGNOSIS — Z743 Need for continuous supervision: Secondary | ICD-10-CM | POA: Diagnosis not present

## 2023-11-20 DIAGNOSIS — E785 Hyperlipidemia, unspecified: Secondary | ICD-10-CM | POA: Diagnosis not present

## 2023-11-20 DIAGNOSIS — M19012 Primary osteoarthritis, left shoulder: Secondary | ICD-10-CM | POA: Diagnosis present

## 2023-11-20 DIAGNOSIS — R296 Repeated falls: Secondary | ICD-10-CM | POA: Diagnosis not present

## 2023-11-20 DIAGNOSIS — R0609 Other forms of dyspnea: Secondary | ICD-10-CM | POA: Diagnosis not present

## 2023-11-20 DIAGNOSIS — Z9981 Dependence on supplemental oxygen: Secondary | ICD-10-CM | POA: Diagnosis not present

## 2023-11-20 DIAGNOSIS — Y92091 Bathroom in other non-institutional residence as the place of occurrence of the external cause: Secondary | ICD-10-CM | POA: Diagnosis not present

## 2023-11-20 DIAGNOSIS — E860 Dehydration: Secondary | ICD-10-CM | POA: Diagnosis not present

## 2023-11-20 DIAGNOSIS — E119 Type 2 diabetes mellitus without complications: Secondary | ICD-10-CM | POA: Diagnosis not present

## 2023-11-20 LAB — BASIC METABOLIC PANEL WITH GFR
Anion gap: 9 (ref 5–15)
BUN: 13 mg/dL (ref 8–23)
CO2: 30 mmol/L (ref 22–32)
Calcium: 9.3 mg/dL (ref 8.9–10.3)
Chloride: 99 mmol/L (ref 98–111)
Creatinine, Ser: 1.08 mg/dL (ref 0.61–1.24)
GFR, Estimated: >60 mL/min (ref >60)
Glucose, Bld: 114 mg/dL — ABNORMAL HIGH (ref 70–99)
Potassium: 3.7 mmol/L (ref 3.5–5.1)
Sodium: 138 mmol/L (ref 135–145)

## 2023-11-20 LAB — CBC
HCT: 39.5 % (ref 39.0–52.0)
Hemoglobin: 13.1 g/dL (ref 13.0–17.0)
MCH: 32.3 pg (ref 26.0–34.0)
MCHC: 33.2 g/dL (ref 30.0–36.0)
MCV: 97.3 fL (ref 80.0–100.0)
Platelets: 148 10*3/uL — ABNORMAL LOW (ref 150–400)
RBC: 4.06 MIL/uL — ABNORMAL LOW (ref 4.22–5.81)
RDW: 13.2 % (ref 11.5–15.5)
WBC: 11.4 10*3/uL — ABNORMAL HIGH (ref 4.0–10.5)
nRBC: 0 % (ref 0.0–0.2)

## 2023-11-20 LAB — HEMOGLOBIN A1C
Hgb A1c MFr Bld: 5.9 % — ABNORMAL HIGH (ref 4.8–5.6)
Mean Plasma Glucose: 122.63 mg/dL

## 2023-11-20 NOTE — TOC Progression Note (Addendum)
 Transition of Care [facility]) - Progression Note    Patient Details  Name: Dylan [name] MRN: [id number] Date of Birth: 12-14-1925  [facility]) CM/SW Contact  Lorri Frederick, LCSW Phone Number: 11/20/2023, 2:14 PM  Clinical Narrative:   Pt oriented x2.  CSW went to pt room to discuss bed offers with family, none present.  Medicare choice document with bed offers left in room.  1410: CSW LM with pt son Gala Romney, requesting call back.   1430: TC son Gala Romney, discussed bed offers, he accepts offer at [facility].   CSW confirmed with [facility].  Auth request to be submitted by CMA/Angela.  Expected Discharge Plan: Skilled Nursing Facility Barriers to Discharge: Continued Medical Work up, SNF Pending bed offer  Expected Discharge Plan and Services In-house Referral: Clinical Social Work   Post Acute Care Choice: Skilled Nursing Facility Living arrangements for the past 2 months: Assisted Living Facility                                       Social Determinants of Health (SDOH) Interventions SDOH Screenings   Food Insecurity: No Food Insecurity (11/18/2023)  Housing: Low Risk  (11/18/2023)  Transportation Needs: No Transportation Needs (11/18/2023)  Utilities: Not At Risk (11/18/2023)  Social Connections: Patient Declined (11/18/2023)  Tobacco Use: Medium Risk (11/18/2023)    Readmission Risk Interventions     No data to display

## 2023-11-20 NOTE — Progress Notes (Signed)
 Mobility Specialist Progress Note:    11/20/23 1401  Therapy Vitals  BP (!) 105/59  Mobility  Activity Transferred from chair to bed  Level of Assistance Minimal assist, patient does 75% or more  Assistive Device Front wheel walker  Distance Ambulated (ft) 4 ft  Activity Response Tolerated well  Mobility Referral Yes (transfer)  Mobility visit 1 Mobility  Mobility Specialist Start Time (ACUTE ONLY) 1353  Mobility Specialist Stop Time (ACUTE ONLY) 1409  Mobility Specialist Time Calculation (min) (ACUTE ONLY) 16 min   Pt received in chair, trying to get up w/ alarm sounding. Seemed confused this pm and stated he "wanted his shoes so he could leave with his friend later". Agreeable to return to bed. Able to stand/pivot to chair w/ minA. No complaints throughout. Pt left in bed with call bell and all needs met. Bed alarm on and RN present.  D'Vante Earlene Plater Mobility Specialist Please contact via Special educational needs teacher or Rehab office at [phone]

## 2023-11-20 NOTE — Progress Notes (Signed)
 Progress Note   Patient: Dylan [name]:[id number] DOB: [birth year] DOA: 11/18/2023     0 DOS: the patient was seen and examined on 11/20/2023   Brief hospital course: 89 year old gentleman history of A-fib on chronic anticoagulation with Eliquis, chronic orthostatic hypotension on midodrine, type 2 diabetes, CHF, tachybradycardia syndrome with junctional bradycardia status post PPM, chronic diastolic CHF, CKD 3A with baseline creatinine approximately 1, hyperlipidemia presented to the ED with recurrent falls. Patient noted to have had multiple falls over the past 3 weeks. On presentation patient noted to have felt dizzy and lightheaded on standing when trying to go to the bathroom and fell forward into the shower. Patient denies any syncopal episodes. Patient admitted for uncontrolled pain and inability to ambulate. Urinalysis done concerning for UTI. Patient placed empirically on IV antibiotics.   Assessment and Plan: #1 frequent falls -Patient noted with multiple frequent falls over the past 3 weeks, per son patient has had 8 falls. -Patient with history of chronic orthostatic hypotension on midodrine 15 mg 3 times daily, patient noted to be on diuretics of Lasix 60 every morning and 40 nightly, also noted to be on Toprol-XL -Patient noted on presentation with complaints of dizziness on standing from supine position prior to fall and likely consistent with orthostasis.  BP noted to go as low as 95/64 on presentation to the ED. -Urinalysis also concerning for UTI. -Continue home regimen midodrine 15 mg 3 times daily. -Continue to hold diuretics.   -Resumed half home dose Toprol-XL at 12.5 mg daily.  -Saline lock IV fluids. -TED hose. -It is noted on epic that cardiologist was recommending a decrease of patient's Toprol-XL to 12.5 mg daily in addition to compression stockings prior to admission. -PT/OT following   2.  Chronic A-fib/tachybradycardia syndrome status post PPM -Patient noted  with history of A-fib on chronic anticoagulation. -Patient noted to be on Toprol-XL 25 mg daily which was to be decreased to 12.5 mg daily per cardiologist note on epic due to patient's multiple falls. -Will resume Toprol-XL at half home dose and uptitrate back to full home dose as BP able to tolerate. -Continue Eliquis as patient in a controlled setting at this time and will need to discuss with patient's cardiologist as to whether patient is still a candidate for anticoagulation in light of recent multiple falls.   3.  UTI -Urine cultures with multiple species present.   -Continued on  empiric IV Rocephin and treat for total of 3 days.     4.  Chronic orthostatic hypotension -Continue home regimen midodrine 15 mg 3 times daily. -Placed on IV fluids on admission, BP improved and currently stable, discontinue IV fluids.  -TED hose.   5.  Dehydration -Hydrated with IV fluids.   6.  Chronic diastolic CHF -Patient more on the dry side and not volume overloaded on presentation. -Patient presented with frequent falls, blood pressure noted to be borderline, patient with chronic orthostatic hypotension. -Patient hydrated gently with IV fluids.   -Continue to hold diuretics . May resume diuretic at 1/2 dose if stable or take on PRN basis   7.  Low back pain -Secondary to falls. -CT of the T and L-spine with no acute abnormalities. -Continue Lidoderm patch, scheduled Tylenol 1000 mg 3 times daily, oxycodone as needed. -PT/OT.   8.  Diabetes mellitus type 2 -Not on diabetic medications per med rec. -Last hemoglobin A1c 5.9 (07/29/2019) -Repeat hemoglobin A1c.   9.  Chronic dyspnea on exertion/chronic respiratory failure -Per son  patient on 2 L home O2 during the daytime and with ambulation and not on O2 at bedtime. -Stable.      Subjective: Without complaints this AM  Physical Exam: Vitals:   11/20/23 0852 11/20/23 1106 11/20/23 1123 11/20/23 1401  BP: (!) 81/55 92/60 92/60  (!)  105/59  Pulse: 64  64   Resp: 18 18    Temp: 97.6 F (36.4 C) 97.6 F (36.4 C)    TempSrc: Oral Oral    SpO2: 98%     Weight:      Height:       General exam: Awake, laying in bed, in nad Respiratory system: Normal respiratory effort, no wheezing Cardiovascular system: regular rate, s1, s2 Gastrointestinal system: Soft, nondistended, positive BS Central nervous system: CN2-12 grossly intact, strength intact Extremities: Perfused, no clubbing Skin: Normal skin turgor, no notable skin lesions seen Psychiatry: Mood normal // no visual hallucinations   Data Reviewed:  Labs reviewed: Na 138, K 3.7, Cr 1.08, WBC 11.4, Hgb 13.1, Plts 148  Family Communication: Pt in room, family not at bedside  Disposition: Status is: Observation The patient will require care spanning > 2 midnights and should be moved to inpatient because: severity of illness  Planned Discharge Destination: Skilled nursing facility     Author: Rickey Barbara, MD 11/20/2023 5:05 PM  For on call review [email or web address].

## 2023-11-20 NOTE — Progress Notes (Signed)
 Physical Therapy Treatment Patient Details Name: Dylan [name] MRN: [id number] DOB: 14-Mar-1926 Today's Date: 11/20/2023   History of Present Illness 89 y.o. male presents 3/31 to the emergency department via EMS from [facility] after being found down on the ground from a fall.  PMHx: chronic congestive heart failure, atrial fibrillation, type II DM, dyspnea on exertion, hypertension, diabetic neuropathy, pacemaker, orthostatic intolerance.    PT Comments  Patient resting in bed and alert, agreeable to participate in therapy visit. Pt required min assist to complete bed mobility with cues for log roll for comfort. Session initiated with LE exercises for strengthening and as warm up. Pt then completed sit<>stand with Min assist for power up and to stabilize balance once standing. Pt initiated ambulation with RW for short room distance. Pt noted to have significant forward trunk flexion and extend RW too far ahead of BOS. Max cues and min assist required to stabilize. Pt amb ~12' todal and seated rest provided, HR noted to be 126 bpm max and recovered to 90's quickly with sitting. Repeated stand step transfer training completed with emphasis on maintaining safe position to RW and EOS pt agreeable to remain OOB. Alarm on and call bell within reach and visitor present in room. RN notified of no fall pads in room and suggestion to place on floor. Will continue to progress pt as able during stay.    If plan is discharge home, recommend the following: A little help with walking and/or transfers;A little help with bathing/dressing/bathroom;Assistance with cooking/housework;Direct supervision/assist for medications management;Direct supervision/assist for financial management;Assist for transportation;Supervision due to cognitive status   Can travel by private vehicle     Yes  Equipment Recommendations  None recommended by PT    Recommendations for Other Services       Precautions / Restrictions  Precautions Precautions: Fall;Other (comment) Recall of Precautions/Restrictions: Impaired Precaution/Restrictions Comments: Back precautions for comfort Restrictions Weight Bearing Restrictions Per Provider Order: No     Mobility  Bed Mobility Overal bed mobility: Needs Assistance Bed Mobility: Supine to Sit, Sit to Supine Rolling: Min assist, Used rails Sidelying to sit: Min assist, Used rails       General bed mobility comments: cues for log roll technique and min assist to fully raise trunk upright.    Transfers Overall transfer level: Needs assistance Equipment used: Rolling walker (2 wheels) Transfers: Sit to/from Stand, Bed to chair/wheelchair/BSC Sit to Stand: Min assist   Step pivot transfers: Min assist       General transfer comment: Cues for hand placement/technique to power up, min assist to fully stand and steady balance. trunk flexed with initial rise. SPT performed x5 for traing with emphasis on maintaining safe proximity to RW.    Ambulation/Gait Ambulation/Gait assistance: Mod assist, Min assist Gait Distance (Feet): 12 Feet Assistive device: Rolling walker (2 wheels) Gait Pattern/deviations: Step-through pattern, Decreased stride length, Shuffle, Trunk flexed Gait velocity: decr     General Gait Details: Mod assist initially with cues for safe proximity to RW, pt fading to min assist with improved position to walker however constant cues and assist required.   Stairs             Wheelchair Mobility     Tilt Bed    Modified Rankin (Stroke Patients Only)       Balance Overall balance assessment: Needs assistance Sitting-balance support: No upper extremity supported, Feet supported Sitting balance-[name]: Fair Sitting balance - Comments: static sitting EOB   Standing balance support:  Bilateral upper extremity supported, Reliant on assistive device for balance, During functional activity Standing balance-[name]:  Poor Standing balance comment: reliant on RW and assist for standing and gait                            Communication Communication Communication: Impaired Factors Affecting Communication: Hearing impaired  Cognition Arousal: Alert Behavior During Therapy: WFL for tasks assessed/performed   PT - Cognitive impairments: Orientation, Sequencing, Problem solving, Safety/Judgement   Orientation impairments: Time                     Following commands: Impaired      Cueing Cueing Techniques: Verbal cues, Tactile cues  Exercises General Exercises - Lower Extremity Long Arc Quad: AROM, Both, 20 reps, Seated, Limitations Long Arc Quad Limitations: 2x10 Hip Flexion/Marching: AROM, Both, 20 reps, Seated, Limitations Hip Flexion/Marching Limitations: 2x10 Toe Raises: AROM, Both, 20 reps, Seated, Limitations Toe Raises Limitations: 2x10 Heel Raises: AROM, Both, 20 reps, Seated, Limitations Heel Raises Limitations: 2x10    General Comments        Pertinent Vitals/Pain Pain Assessment Pain Assessment: No/denies pain Faces Pain Scale: No hurt Facial Expression: Relaxed, neutral Pain Intervention(s): Limited activity within patient's tolerance, Monitored during session, Repositioned    Home Living                          Prior Function            PT Goals (current goals can now be found in the care plan section) Acute Rehab PT Goals Patient Stated Goal: get well PT Goal Formulation: With patient/family Time For Goal Achievement: 12/02/23 Potential to Achieve Goals: Good Progress towards PT goals: Progressing toward goals    Frequency    Min 2X/week      PT Plan      Co-evaluation              AM-PAC PT "6 Clicks" Mobility   Outcome Measure  Help needed turning from your back to your side while in a flat bed without using bedrails?: A Little Help needed moving from lying on your back to sitting on the side of a flat bed  without using bedrails?: A Little Help needed moving to and from a bed to a chair (including a wheelchair)?: A Little Help needed standing up from a chair using your arms (e.g., wheelchair or bedside chair)?: A Little Help needed to walk in hospital room?: A Little Help needed climbing 3-5 steps with a railing? : A Lot 6 Click Score: 17    End of Session Equipment Utilized During Treatment: Gait belt;Oxygen Activity Tolerance: Patient tolerated treatment well Patient left: in bed;with call bell/phone within reach;with bed alarm set;with family/visitor present Nurse Communication: Mobility status PT Visit Diagnosis: Unsteadiness on feet (R26.81);Muscle weakness (generalized) (M62.81);Difficulty in walking, not elsewhere classified (R26.2);Other symptoms and signs involving the nervous system (R29.898);Pain     Time: 4782-9562 PT Time Calculation (min) (ACUTE ONLY): 30 min  Charges:    $Gait Training: 8-22 mins $Therapeutic Exercise: 8-22 mins PT General Charges $$ ACUTE PT VISIT: 1 Visit                     Wynn Maudlin, DPT Acute Rehabilitation Services Office [phone]  11/20/23 4:47 PM

## 2023-11-20 NOTE — Hospital Course (Signed)
 89 year old gentleman history of A-fib on chronic anticoagulation with Eliquis, chronic orthostatic hypotension on midodrine, type 2 diabetes, CHF, tachybradycardia syndrome with junctional bradycardia status post PPM, chronic diastolic CHF, CKD 3A with baseline creatinine approximately 1, hyperlipidemia presented to the ED with recurrent falls. Patient noted to have had multiple falls over the past 3 weeks. On presentation patient noted to have felt dizzy and lightheaded on standing when trying to go to the bathroom and fell forward into the shower. Patient denies any syncopal episodes. Patient admitted for uncontrolled pain and inability to ambulate. Urinalysis done concerning for UTI. Patient placed empirically on IV antibiotics.

## 2023-11-20 NOTE — Plan of Care (Signed)
  Problem: Pain Managment: Goal: General experience of comfort will improve and/or be controlled Outcome: Progressing   Problem: Safety: Goal: Ability to remain free from injury will improve Outcome: Progressing

## 2023-11-20 NOTE — Progress Notes (Signed)
 Mobility Specialist Progress Note:    11/20/23 1000  Mobility  Activity Transferred to/from [facility];Transferred from bed to chair  Level of Assistance Minimal assist, patient does 75% or more  Assistive Device Front wheel walker  Distance Ambulated (ft) 8 ft (4+4)  Activity Response Tolerated well  Mobility Referral Yes  Mobility visit 1 Mobility  Mobility Specialist Start Time (ACUTE ONLY) [id number]  Mobility Specialist Stop Time (ACUTE ONLY) 0925  Mobility Specialist Time Calculation (min) (ACUTE ONLY) 29 min   Pt received in bed and agreeable. Reported need to have BM upon sitting EOB. Able to stand and pivot to [facility] w/ minA. C/o back and R shoulder pain. BM successful, MS assisted w/ peri care. Pt then able to stand and take steps to chair. Left in chair with call bell and all needs met. Chair alarm on and family present.  D'Vante Earlene Plater Mobility Specialist Please contact via Special educational needs teacher or Rehab office at [phone]

## 2023-11-20 NOTE — Procedures (Addendum)
 Modified Barium Swallow Study  Patient Details  Name: Dylan [name] MRN: [id number] Date of Birth: 08/06/1926  Today's Date: 11/20/2023  Modified Barium Swallow completed.  Full report located under Chart Review in the Imaging Section.  History of Present Illness Patient is a 89 y.o. male with PMH: a-fib, orthostatic hypotension, DM, ,HTN, CHF, CHF, CKD stage IIIa. He resides at [facility]. He presented to the hospital on 11/18/23 following a fall. Per son's report, patient has had approximately 8 falls in the past three weeks. CT head CT C-spine with no acute intracranial abnormality or cervical spine fracture.  SLP ordered to evaluate swallowing after daughter and son both witnessed him to cough after drinking liquids and while eating.   Clinical Impression Patient exhibits an oropharyngeal dysphagia as per this MBS. During oral phase, mastication was prolonged with solids and he did have mild oral residuals with liquids and solids s/p initial swallow. Swallow initiated at level of vallecular sinus with all tested consistencies. Epiglottic inversion and laryngeal vestibule closure were both complete but sluggish/delayed suspected to be from decreased anterior hyoid excursion as well as presence of large cervical osteophyte (no radiologist present to confirm) impeding epiglottic inversion efficiency. Moderate amount of vallecular residuals observed with mechanical soft solids but only mild amount with nectar and honey thick liquids and puree solids. Puree solids helped to clear vallecular residuals from mechanical soft solids. Both sensed and silent aspiration occured with thin liquids prior to initiation of the swallow. Aspiration occured with teaspoon sips, cup sips and occured with head in neutral and chin tuck posture. No penetration or aspiration observed with any of the other tested liquid or solid consistencies. SLP recommending to continue with nectar thick liquids, change solids to  dysphagia 3 (mechanical soft) however as patient's dysphagia is likely chronic, SLP will speak with patient and his family to determine their wishes.   Factors that may increase risk of adverse event in presence of aspiration [name]): Poor general health and/or compromised immunity;Limited mobility;Frail or deconditioned  Swallow Evaluation Recommendations Recommendations: PO diet PO Diet Recommendation: Regular;Dysphagia 3 (Mechanical soft);Mildly thick liquids (Level 2, nectar thick) Liquid Administration via: Cup;Straw Medication Administration: Other (Comment) (as tolerated) Supervision: Patient able to self-feed;Staff to assist with self-feeding Swallowing strategies  : Slow rate;Minimize environmental distractions;Small bites/sips;Follow solids with liquids Postural changes: Position pt fully upright for meals Oral care recommendations: Oral care BID (2x/day)   Angela Nevin, MA, CCC-SLP Speech Therapy

## 2023-11-21 DIAGNOSIS — M545 Low back pain, unspecified: Secondary | ICD-10-CM | POA: Diagnosis not present

## 2023-11-21 DIAGNOSIS — M542 Cervicalgia: Secondary | ICD-10-CM | POA: Diagnosis not present

## 2023-11-21 DIAGNOSIS — R059 Cough, unspecified: Secondary | ICD-10-CM | POA: Diagnosis not present

## 2023-11-21 DIAGNOSIS — Z950 Presence of cardiac pacemaker: Secondary | ICD-10-CM | POA: Diagnosis not present

## 2023-11-21 DIAGNOSIS — R0902 Hypoxemia: Secondary | ICD-10-CM | POA: Diagnosis not present

## 2023-11-21 DIAGNOSIS — W19XXXA Unspecified fall, initial encounter: Secondary | ICD-10-CM | POA: Diagnosis not present

## 2023-11-21 DIAGNOSIS — E662 Morbid (severe) obesity with alveolar hypoventilation: Secondary | ICD-10-CM | POA: Diagnosis not present

## 2023-11-21 DIAGNOSIS — E785 Hyperlipidemia, unspecified: Secondary | ICD-10-CM | POA: Diagnosis not present

## 2023-11-21 DIAGNOSIS — J310 Chronic rhinitis: Secondary | ICD-10-CM | POA: Diagnosis not present

## 2023-11-21 DIAGNOSIS — I5032 Chronic diastolic (congestive) heart failure: Secondary | ICD-10-CM | POA: Diagnosis not present

## 2023-11-21 DIAGNOSIS — W01198A Fall on same level from slipping, tripping and stumbling with subsequent striking against other object, initial encounter: Secondary | ICD-10-CM | POA: Diagnosis not present

## 2023-11-21 DIAGNOSIS — R296 Repeated falls: Secondary | ICD-10-CM | POA: Diagnosis not present

## 2023-11-21 DIAGNOSIS — Z7901 Long term (current) use of anticoagulants: Secondary | ICD-10-CM | POA: Diagnosis not present

## 2023-11-21 DIAGNOSIS — I4891 Unspecified atrial fibrillation: Secondary | ICD-10-CM | POA: Diagnosis not present

## 2023-11-21 DIAGNOSIS — S50312A Abrasion of left elbow, initial encounter: Secondary | ICD-10-CM | POA: Diagnosis not present

## 2023-11-21 DIAGNOSIS — Z743 Need for continuous supervision: Secondary | ICD-10-CM | POA: Diagnosis not present

## 2023-11-21 DIAGNOSIS — E1151 Type 2 diabetes mellitus with diabetic peripheral angiopathy without gangrene: Secondary | ICD-10-CM | POA: Diagnosis not present

## 2023-11-21 DIAGNOSIS — R5383 Other fatigue: Secondary | ICD-10-CM | POA: Diagnosis not present

## 2023-11-21 DIAGNOSIS — R2681 Unsteadiness on feet: Secondary | ICD-10-CM | POA: Diagnosis not present

## 2023-11-21 DIAGNOSIS — N3000 Acute cystitis without hematuria: Secondary | ICD-10-CM | POA: Diagnosis not present

## 2023-11-21 DIAGNOSIS — R531 Weakness: Secondary | ICD-10-CM | POA: Diagnosis not present

## 2023-11-21 DIAGNOSIS — S81801A Unspecified open wound, right lower leg, initial encounter: Secondary | ICD-10-CM | POA: Diagnosis not present

## 2023-11-21 DIAGNOSIS — E119 Type 2 diabetes mellitus without complications: Secondary | ICD-10-CM | POA: Diagnosis not present

## 2023-11-21 DIAGNOSIS — N3001 Acute cystitis with hematuria: Secondary | ICD-10-CM | POA: Diagnosis not present

## 2023-11-21 DIAGNOSIS — S0101XA Laceration without foreign body of scalp, initial encounter: Secondary | ICD-10-CM | POA: Diagnosis not present

## 2023-11-21 DIAGNOSIS — Y92129 Unspecified place in nursing home as the place of occurrence of the external cause: Secondary | ICD-10-CM | POA: Diagnosis not present

## 2023-11-21 DIAGNOSIS — Z7401 Bed confinement status: Secondary | ICD-10-CM | POA: Diagnosis not present

## 2023-11-21 DIAGNOSIS — S0990XA Unspecified injury of head, initial encounter: Secondary | ICD-10-CM | POA: Diagnosis present

## 2023-11-21 DIAGNOSIS — I480 Paroxysmal atrial fibrillation: Secondary | ICD-10-CM | POA: Diagnosis not present

## 2023-11-21 DIAGNOSIS — Z79899 Other long term (current) drug therapy: Secondary | ICD-10-CM | POA: Diagnosis not present

## 2023-11-21 DIAGNOSIS — R000 Tachycardia, unspecified: Secondary | ICD-10-CM | POA: Diagnosis not present

## 2023-11-21 DIAGNOSIS — S199XXA Unspecified injury of neck, initial encounter: Secondary | ICD-10-CM | POA: Diagnosis not present

## 2023-11-21 DIAGNOSIS — E1140 Type 2 diabetes mellitus with diabetic neuropathy, unspecified: Secondary | ICD-10-CM | POA: Diagnosis not present

## 2023-11-21 DIAGNOSIS — I495 Sick sinus syndrome: Secondary | ICD-10-CM | POA: Diagnosis not present

## 2023-11-21 DIAGNOSIS — I739 Peripheral vascular disease, unspecified: Secondary | ICD-10-CM | POA: Diagnosis not present

## 2023-11-21 DIAGNOSIS — R0609 Other forms of dyspnea: Secondary | ICD-10-CM | POA: Diagnosis not present

## 2023-11-21 DIAGNOSIS — I951 Orthostatic hypotension: Secondary | ICD-10-CM | POA: Diagnosis not present

## 2023-11-21 DIAGNOSIS — F0390 Unspecified dementia without behavioral disturbance: Secondary | ICD-10-CM | POA: Diagnosis not present

## 2023-11-21 DIAGNOSIS — I10 Essential (primary) hypertension: Secondary | ICD-10-CM | POA: Diagnosis not present

## 2023-11-21 MED ORDER — IBUPROFEN 400 MG PO TABS: 400.0000 mg | ORAL_TABLET | Freq: Four times a day (QID) | ORAL | Status: DC | PRN

## 2023-11-21 MED ORDER — OXYCODONE HCL 5 MG PO TABS: 5.0000 mg | ORAL_TABLET | ORAL | 0 refills | Status: DC | PRN

## 2023-11-21 MED ORDER — LIDOCAINE 5 % EX PTCH: 1.0000 | MEDICATED_PATCH | CUTANEOUS | 0 refills | Status: DC

## 2023-11-21 MED ORDER — METOPROLOL SUCCINATE ER 25 MG PO TB24: 12.5000 mg | ORAL_TABLET | Freq: Every day | ORAL | Status: AC

## 2023-11-21 MED ORDER — FUROSEMIDE 40 MG PO TABS: 40.0000 mg | ORAL_TABLET | Freq: Every day | ORAL | Status: DC | PRN

## 2023-11-21 NOTE — Progress Notes (Signed)
 Occupational Therapy Treatment Patient Details Name: Dylan [name] MRN: [id number] DOB: Jul 31, 1926 Today's Date: 11/21/2023   History of present illness 89 y.o. male presents 3/31 to the emergency department via EMS from [facility] after being found down on the ground from a fall.  PMHx: chronic congestive heart failure, atrial fibrillation, type II DM, dyspnea on exertion, hypertension, diabetic neuropathy, pacemaker, orthostatic intolerance.   OT comments  Patient received in supine and agreeable to OT treatment. Patient was min assist to get to EOB due to assistance needed with trunk. Patient stated dizziness but subsided with increased time. Patient able to stand with min assist and ambulate to sink for grooming tasks with CGA for balance and required seated rest break following. Following rest break patient able to ambulate to recliner and positioned for comfort. Patient will benefit from intensive inpatient follow-up therapy, >3 hours/day. Acute OT to continue to follow to address established goals to facilitate DC to next venue of care.      If plan is discharge home, recommend the following:  A little help with walking and/or transfers;A lot of help with bathing/dressing/bathroom;Assistance with cooking/housework;Direct supervision/assist for medications management;Direct supervision/assist for financial management;Assist for transportation;Help with stairs or ramp for entrance;Supervision due to cognitive status   Equipment Recommendations  Other (comment) (defer)    Recommendations for Other Services Rehab consult    Precautions / Restrictions Precautions Precautions: Fall;Other (comment) Recall of Precautions/Restrictions: Impaired Precaution/Restrictions Comments: Back precautions for comfort Restrictions Weight Bearing Restrictions Per Provider Order: No       Mobility Bed Mobility Overal bed mobility: Needs Assistance Bed Mobility: Supine to Sit     Supine to sit:  Min assist, Used rails, HOB elevated     General bed mobility comments: required assist to raise trunk    Transfers Overall transfer level: Needs assistance Equipment used: Rolling walker (2 wheels) Transfers: Sit to/from Stand, Bed to chair/wheelchair/BSC Sit to Stand: Min assist           General transfer comment: cues for hand placement and min assist to power up and CGA for mobility and transfers     Balance Overall balance assessment: Needs assistance Sitting-balance support: No upper extremity supported, Feet supported Sitting balance-[name]: Fair Sitting balance - Comments: static sitting EOB   Standing balance support: Single extremity supported, Bilateral upper extremity supported, During functional activity Standing balance-[name]: Poor Standing balance comment: able to stand at sink with one extremity support                           ADL either performed or assessed with clinical judgement   ADL Overall ADL's : Needs assistance/impaired     Grooming: Wash/dry hands;Wash/dry face;Oral care;Contact guard assist;Standing Grooming Details (indicate cue type and reason): at sink         Upper Body Dressing : Set up;Sitting Upper Body Dressing Details (indicate cue type and reason): donned gown to cover back Lower Body Dressing: Moderate assistance;Sitting/lateral leans;Sit to/from stand                 General ADL Comments: required seated rest break following self care tasks standing at sink    Extremity/Trunk Assessment Upper Extremity Assessment Upper Extremity Assessment: Generalized weakness            Vision       Perception     Praxis     Communication Communication Communication: Impaired Factors Affecting Communication: Hearing impaired  Cognition Arousal: Alert Behavior During Therapy: WFL for tasks assessed/performed Cognition: No family/caregiver present to determine baseline             OT -  Cognition Comments: alert and oriented, able to follow directions                 Following commands: Impaired Following commands impaired: Only follows one step commands consistently      Cueing   Cueing Techniques: Verbal cues, Tactile cues  Exercises      Shoulder Instructions       General Comments VSS on 2 liters O2    Pertinent Vitals/ Pain       Pain Assessment Pain Assessment: Faces Faces Pain Scale: Hurts a little bit Pain Location: Pt reports back pain with movement Pain Descriptors / Indicators: Aching Pain Intervention(s): Limited activity within patient's tolerance, Monitored during session, Repositioned  Home Living                                          Prior Functioning/Environment              Frequency  Min 2X/week        Progress Toward Goals  OT Goals(current goals can now be found in the care plan section)  Progress towards OT goals: Progressing toward goals  Acute Rehab OT Goals Patient Stated Goal: have breakfast OT Goal Formulation: With patient Time For Goal Achievement: 12/02/23 Potential to Achieve Goals: Good ADL Goals Pt Will Perform Grooming: with supervision;standing Pt Will Perform Upper Body Dressing: with supervision;sitting Pt Will Perform Lower Body Dressing: with supervision;sit to/from stand Pt Will Transfer to Toilet: with supervision;ambulating;regular height toilet Pt Will Perform Toileting - Clothing Manipulation and hygiene: with supervision;sit to/from stand;sitting/lateral leans Additional ADL Goal #1: Pt will follow multi-step verbal commands 75% of session to improve independence in functional tasks  Plan      Co-evaluation                 AM-PAC OT "6 Clicks" Daily Activity     Outcome Measure   Help from another person eating meals?: A Little Help from another person taking care of personal grooming?: A Little Help from another person toileting, which includes using  toliet, bedpan, or urinal?: A Lot Help from another person bathing (including washing, rinsing, drying)?: A Lot Help from another person to put on and taking off regular upper body clothing?: A Little Help from another person to put on and taking off regular lower body clothing?: A Lot 6 Click Score: 15    End of Session Equipment Utilized During Treatment: Gait belt;Rolling walker (2 wheels);Oxygen (2 liters)  OT Visit Diagnosis: Unsteadiness on feet (R26.81);Other abnormalities of gait and mobility (R26.89);Repeated falls (R29.6);Muscle weakness (generalized) (M62.81);History of falling (Z91.81)   Activity Tolerance Patient tolerated treatment well   Patient Left in bed;with call bell/phone within reach;with bed alarm set   Nurse Communication Mobility status        Time: 0705-0728 OT Time Calculation (min): 23 min  Charges: OT General Charges $OT Visit: 1 Visit OT Treatments $Self Care/Home Management : 23-37 mins  Alfonse Flavors, OTA Acute Rehabilitation Services  Office [phone]   Dewain Penning 11/21/2023, 8:15 AM

## 2023-11-21 NOTE — Progress Notes (Signed)
 AVS/DC packet given to PTAR. Report given to [facility], LPN at [facility].

## 2023-11-21 NOTE — Discharge Summary (Signed)
 Physician Discharge Summary   Patient: Dylan [name] MRN: [id number] DOB: February 09, 1926  Admit date:     11/18/2023  Discharge date: 11/21/23  Discharge Physician: Rickey Barbara   PCP: Gaspar Garbe, MD   Recommendations at discharge:    Follow up with pcp in 1-2 weeks Follow up with Cardiology as scheduled on 4/10 at 12pm  Discharge Diagnoses: Principal Problem:   Frequent falls Active Problems:   DOE (dyspnea on exertion)   Benign essential HTN   Type 2 diabetes mellitus without complication (HCC)   Dyslipidemia   Atrial fibrillation (HCC)   Bradycardia   Chronic diastolic CHF (congestive heart failure) (HCC)   Diabetic neuropathy (HCC)   Sinus node dysfunction (HCC)   Pacemaker   PVD (peripheral vascular disease) (HCC)   Orthostatic intolerance   Hypokalemia  Resolved Problems:   * No resolved hospital problems. *  Hospital Course: 89 year old gentleman history of A-fib on chronic anticoagulation with Eliquis, chronic orthostatic hypotension on midodrine, type 2 diabetes, CHF, tachybradycardia syndrome with junctional bradycardia status post PPM, chronic diastolic CHF, CKD 3A with baseline creatinine approximately 1, hyperlipidemia presented to the ED with recurrent falls. Patient noted to have had multiple falls over the past 3 weeks. On presentation patient noted to have felt dizzy and lightheaded on standing when trying to go to the bathroom and fell forward into the shower. Patient denies any syncopal episodes. Patient admitted for uncontrolled pain and inability to ambulate. Urinalysis done concerning for UTI. Patient placed empirically on IV antibiotics.   Assessment and Plan: #1 frequent falls -Patient noted with multiple frequent falls over the past 3 weeks, per son patient has had 8 falls. -Patient with history of chronic orthostatic hypotension on midodrine 15 mg 3 times daily, patient was noted to be on diuretics of Lasix 60 every morning and 40 nightly, also  noted to be on Toprol-XL -Patient noted on presentation with complaints of dizziness on standing from supine position prior to fall and likely consistent with orthostasis.  BP noted to go as low as 95/64 on presentation to the ED. -Urinalysis also concerning for UTI. -Continue home regimen midodrine 15 mg 3 times daily. -held diuretics while inpatient -Resumed half home dose Toprol-XL at 12.5 mg daily.  -Saline lock IV fluids. -TED hose. -It is noted on epic that cardiologist was recommending a decrease of patient's Toprol-XL to 12.5 mg daily in addition to compression stockings prior to admission. -PT/OT following   2.  Chronic A-fib/tachybradycardia syndrome status post PPM -Patient noted with history of A-fib on chronic anticoagulation. -Patient noted to be on Toprol-XL 25 mg daily which was to be decreased to 12.5 mg daily per cardiologist note on epic due to patient's multiple falls. -resumed Toprol-XL at half home dose  -Continued Eliquis as patient in a controlled setting at this time and will need to discuss with patient's cardiologist as to whether patient is still a candidate for anticoagulation in light of recent multiple falls.   3.  UTI -Urine cultures with multiple species present.   -Completed course of IV Rocephin    4.  Chronic orthostatic hypotension -Continue home regimen midodrine 15 mg 3 times daily. -Placed on IV fluids on admission, BP improved and currently stable, discontinued IV fluids.  -TED hose.   5.  Dehydration -Given IV fluids.   6.  Chronic diastolic CHF -Patient more on the dry side and not volume overloaded on presentation. -Patient presented with frequent falls, blood pressure noted to be borderline,  patient with chronic orthostatic hypotension. -Patient hydrated gently with IV fluids.   -Continue lasix on PRN basis for fluid or edema -Would have pt f/u with Cardiology   7.  Low back pain -Secondary to falls. -CT of the T and L-spine with no  acute abnormalities. -Continue Lidoderm patch, scheduled Tylenol 1000 mg 3 times daily, oxycodone as needed. -PT/OT.   8.  Diabetes mellitus type 2 -Not on diabetic medications per med rec. -Last hemoglobin A1c 5.9 (07/29/2019) -Repeat hemoglobin A1c of 5.9   9.  Chronic dyspnea on exertion/chronic respiratory failure -Per son patient on 2 L home O2 during the daytime and with ambulation and not on O2 at bedtime. -Stable.       Consultants:  Procedures performed:   Disposition: Skilled nursing facility Diet recommendation:  Regular diet DISCHARGE MEDICATION: Allergies as of 11/21/2023       Reactions   Keflex [cephalexin] Hives, Other (See Comments)   Occurred in the 1980's Tolerated ceftriaxone 11/18/23        Medication List     STOP taking these medications    potassium chloride SA 20 MEQ tablet Commonly known as: KLOR-CON M       TAKE these medications    acetaminophen 500 MG tablet Commonly known as: TYLENOL Take 500 mg by mouth daily in the afternoon.   docusate sodium 100 MG capsule Commonly known as: COLACE Take 100 mg by mouth See admin instructions. 1 capsule every week on Wednesday   Eliquis 5 MG Tabs tablet Generic drug: apixaban Take 1 tablet by mouth twice daily   furosemide 40 MG tablet Commonly known as: Lasix Take 1 tablet (40 mg total) by mouth daily as needed for fluid or edema. What changed:  when to take this reasons to take this Another medication with the same name was removed. Continue taking this medication, and follow the directions you see here.   ibuprofen 400 MG tablet Commonly known as: ADVIL Take 1 tablet (400 mg total) by mouth every 6 (six) hours as needed for mild pain (pain score 1-3) (or Fever >/= 101).   metoprolol succinate 25 MG 24 hr tablet Commonly known as: TOPROL-XL Take 0.5 tablets (12.5 mg total) by mouth daily. Start taking on: November 22, 2023 What changed: how much to take   midodrine 5 MG  tablet Commonly known as: PROAMATINE Take 15 mg by mouth 3 (three) times daily with meals.   oxyCODONE 5 MG immediate release tablet Commonly known as: Oxy IR/ROXICODONE Take 1 tablet (5 mg total) by mouth every 4 (four) hours as needed for moderate pain (pain score 4-6).   rosuvastatin 10 MG tablet Commonly known as: CRESTOR Take 1 tablet by mouth once daily        Contact information for after-discharge care     Destination     [facility] Preferred SNF .   Service: Skilled Nursing Contact information: [address] [place] [address] [phone]                    Discharge Exam: [facility] Weights   11/18/23 0551  Weight: 104.3 kg   General exam: Awake, laying in bed, in nad Respiratory system: Normal respiratory effort, no wheezing Cardiovascular system: regular rate, s1, s2 Gastrointestinal system: Soft, nondistended, positive BS Central nervous system: CN2-12 grossly intact, strength intact Extremities: Perfused, no clubbing Skin: Normal skin turgor, no notable skin lesions seen Psychiatry: Mood normal // no visual hallucinations  Condition at discharge: fair  The results of significant diagnostics from this hospitalization (including imaging, microbiology, ancillary and laboratory) are listed below for reference.   Imaging Studies: DG Swallowing Func-Speech Pathology Result Date: 11/20/2023 Table formatting from the original result was not included. Images from the original result were not included. Modified Barium Swallow Study Patient Details Name: NUEL DEJAYNES MRN: [id number] Date of Birth: August 24, 1925 Today's Date: 11/20/2023 HPI/PMH: HPI: Patient is a 89 y.o. male with PMH: a-fib, orthostatic hypotension, DM, ,HTN, CHF, CHF, CKD stage IIIa. He resides at [facility]. He presented to the hospital on 11/18/23 following a fall. Per son's report, patient has had approximately 8 falls in the past three  weeks. CT head CT C-spine with no acute intracranial abnormality or cervical spine fracture.  SLP ordered to evaluate swallowing after daughter and son both witnessed him to cough after drinking liquids and while eating. Clinical Impression: Clinical Impression: Patient exhibits an oropharyngeal dysphagia as per this MBS. During oral phase, mastication was prolonged with solids and he did have mild oral residuals with liquids and solids s/p initial swallow. Swallow initiated at level of vallecular sinus with all tested consistencies. Epiglottic inversion and laryngeal vestibule closure were both complete but sluggish/delayed suspected to be from decreased anterior hyoid excursion as well as presence of large cervical osteophyte (no radiologist present to confirm) impeding epiglottic inversion efficiency. Moderate amount of vallecular residuals observed with mechanical soft solids but only mild amount with nectar and honey thick liquids and puree solids. Puree solids helped to clear vallecular residuals from mechanical soft solids. Both sensed and silent aspiration occured with thin liquids prior to initiation of the swallow. Aspiration occured with teaspoon sips, cup sips and occured with head in neutral and chin tuck posture. No penetration or aspiration observed with any of the other tested liquid or solid consistencies. SLP recommending to continue with nectar thick liquids, however as patient's dysphagia is likely chronic, SLP will speak with patient and his family to determine their wishes. Factors that may increase risk of adverse event in presence of aspiration [name]): Factors that may increase risk of adverse event in presence of aspiration [name]): Poor general health and/or compromised immunity; Limited mobility; Frail or deconditioned Recommendations/Plan: Swallowing Evaluation Recommendations Swallowing Evaluation Recommendations Recommendations: PO diet PO Diet  Recommendation: Regular; Dysphagia 3 (Mechanical soft); Mildly thick liquids (Level 2, nectar thick) Liquid Administration via: Cup; Straw Medication Administration: Other (Comment) (as tolerated) Supervision: Patient able to self-feed; Staff to assist with self-feeding Swallowing strategies  : Slow rate; Minimize environmental distractions; Small bites/sips; Follow solids with liquids Postural changes: Position pt fully upright for meals Oral care recommendations: Oral care BID (2x/day) Treatment Plan Treatment Plan Treatment recommendations: Therapy as outlined in treatment plan below Follow-up recommendations: Other (comment) (SLP at next venue of care) Functional status assessment: Patient has had a recent decline in their functional status and demonstrates the ability to make significant improvements in function in a reasonable and predictable amount of time. Treatment frequency: Min 2x/week Treatment duration: 1 week Interventions: Aspiration precaution training; Trials of upgraded texture/liquids; Diet toleration management by SLP; Patient/family education; Compensatory techniques Recommendations Recommendations for follow up therapy are one component of a multi-disciplinary discharge planning process, led by the attending physician.  Recommendations may be updated based on patient status, additional functional criteria and insurance authorization. Assessment: Orofacial Exam: Orofacial Exam Oral Cavity: Oral Hygiene: WFL Oral Cavity - Dentition: Dentures, top Orofacial Anatomy: [facility] Oral  Motor/Sensory Function: WFL Anatomy: Anatomy: Suspected cervical osteophytes Boluses Administered: Boluses Administered Boluses Administered: Thin liquids (Level 0); Mildly thick liquids (Level 2, nectar thick); Moderately thick liquids (Level 3, honey thick); Puree; Solid  Oral Impairment Domain: Oral Impairment Domain Lip Closure: No labial escape Tongue control during bolus hold: Cohesive bolus between tongue to palatal  seal Bolus preparation/mastication: Slow prolonged chewing/mashing with complete recollection Bolus transport/lingual motion: Brisk tongue motion Oral residue: Residue collection on oral structures Location of oral residue : Tongue; Palate Initiation of pharyngeal swallow : Valleculae  Pharyngeal Impairment Domain: Pharyngeal Impairment Domain Soft palate elevation: No bolus between soft palate (SP)/pharyngeal wall (PW) Laryngeal elevation: Complete superior movement of thyroid cartilage with complete approximation of arytenoids to epiglottic petiole Anterior hyoid excursion: Partial anterior movement Epiglottic movement: Complete inversion Laryngeal vestibule closure: Complete, no air/contrast in laryngeal vestibule Pharyngeal contraction (A/P view only): N/A Pharyngoesophageal segment opening: Complete distension and complete duration, no obstruction of flow Tongue base retraction: No contrast between tongue base and posterior pharyngeal wall (PPW)  Esophageal Impairment Domain: Esophageal Impairment Domain Esophageal clearance upright position: Esophageal retention Pill: No data recorded Penetration/Aspiration Scale Score: Penetration/Aspiration Scale Score 1.  Material does not enter airway: Mildly thick liquids (Level 2, nectar thick); Moderately thick liquids (Level 3, honey thick); Puree; Solid 5.  Material enters airway, CONTACTS cords and not ejected out: Thin liquids (Level 0) 7.  Material enters airway, passes BELOW cords and not ejected out despite cough attempt by patient: Thin liquids (Level 0) 8.  Material enters airway, passes BELOW cords without attempt by patient to eject out (silent aspiration) : Thin liquids (Level 0) Compensatory Strategies: Compensatory Strategies Compensatory strategies: Yes Chin tuck: Ineffective Ineffective Chin Tuck: Thin liquid (Level 0)   General Information: No data recorded Diet Prior to this Study: Regular; Mildly thick liquids (Level 2, nectar thick)   No data  recorded  Respiratory Status: WFL   Supplemental O2: None (Room air)   History of Recent Intubation: No  Behavior/Cognition: Cooperative; Alert; Pleasant mood Self-Feeding Abilities: Able to self-feed; Needs assist with self-feeding Baseline vocal quality/speech: Normal Volitional Cough: Able to elicit Volitional Swallow: Able to elicit Exam Limitations: No limitations Goal Planning: Prognosis for improved oropharyngeal function: Fair Barriers to Reach Goals: Time post onset No data recorded Patient/Family Stated Goal: determine safest discharge plan Consulted and agree with results and recommendations: Patient Pain: Pain Assessment Pain Assessment: No/denies pain Faces Pain Scale: 0 Facial Expression: 2 End of Session: Start Time:SLP Start Time (ACUTE ONLY): 1030 Stop Time: SLP Stop Time (ACUTE ONLY): 1045 Time Calculation:SLP Time Calculation (min) (ACUTE ONLY): 15 min Charges: SLP Evaluations $ SLP Speech Visit: 1 Visit SLP Evaluations $BSS Swallow: 1 Procedure $MBS Swallow: 1 Procedure SLP visit diagnosis: SLP Visit Diagnosis: Dysphagia, oropharyngeal phase (R13.12) Past Medical History: Past Medical History: Diagnosis Date  COVID-19   Diabetes mellitus without complication (HCC)   Dyspnea   History of hiatal hernia   Hyperglycemia   Hyperlipidemia   Hypertension   Lung nodule   Myocardial infarction (HCC)   Presence of permanent cardiac pacemaker  Past Surgical History: Past Surgical History: Procedure Laterality Date  APPENDECTOMY  1956  BACK SURGERY  2008  CARDIAC CATHETERIZATION  1995  negative results  CARDIOVERSION N/A 08/05/2017  Procedure: CARDIOVERSION;  Surgeon: Laurey Morale, MD;  Location: [facility] ENDOSCOPY;  Service: Cardiovascular;  Laterality: N/A;  CARDIOVERSION N/A 09/12/2017  Procedure: CARDIOVERSION;  Surgeon: Laurey Morale, MD;  Location: [facility] ENDOSCOPY;  Service: Cardiovascular;  Laterality: N/A;  CARDIOVERSION N/A 09/15/2021  Procedure: CARDIOVERSION;  Surgeon: Laurey Morale, MD;  Location:  [facility] ENDOSCOPY;  Service: Cardiovascular;  Laterality: N/A;  CARDIOVERSION N/A 10/12/2021  Procedure: CARDIOVERSION;  Surgeon: Laurey Morale, MD;  Location: [facility] ENDOSCOPY;  Service: Cardiovascular;  Laterality: N/A;  COLON SURGERY  07/09/12  HERNIA REPAIR  1952  HOT HEMOSTASIS  01/08/2012  Procedure: HOT HEMOSTASIS (ARGON PLASMA COAGULATION/BICAP);  Surgeon: Willis Modena, MD;  Location: [facility];  Service: Endoscopy;  Laterality: N/A;  LAPAROSCOPIC ILEOCECECTOMY  07/09/2012  Procedure: LAPAROSCOPIC ILEOCECECTOMY;  Surgeon: Mariella Saa, MD;  Location: [facility] ORS;  Service: General;  Laterality: N/A;  Laparoscopic Ileocecectomy  PACEMAKER IMPLANT N/A 10/05/2017  Procedure: PACEMAKER IMPLANT;  Surgeon: Duke Salvia, MD;  Location: [facility] INVASIVE CV LAB;  Service: Cardiovascular;  Laterality: N/A;  REPLACEMENT TOTAL KNEE  2010  TEE WITHOUT CARDIOVERSION N/A 08/05/2017  Procedure: TRANSESOPHAGEAL ECHOCARDIOGRAM (TEE);  Surgeon: Laurey Morale, MD;  Location: [facility] ENDOSCOPY;  Service: Cardiovascular;  Laterality: N/A;  TEE WITHOUT CARDIOVERSION N/A 09/12/2017  Procedure: TRANSESOPHAGEAL ECHOCARDIOGRAM (TEE);  Surgeon: Laurey Morale, MD;  Location: [facility] ENDOSCOPY;  Service: Cardiovascular;  Laterality: N/A; Angela Nevin, MA, CCC-SLP Speech Therapy   CT Thoracic Spine Wo Contrast Result Date: 11/18/2023 CLINICAL DATA:  Back trauma, no prior imaging (Age >= 16y). Unwitnessed fall. Back pain. EXAM: CT THORACIC AND LUMBAR SPINE WITHOUT CONTRAST TECHNIQUE: Multidetector CT imaging of the thoracic and lumbar spine was performed without contrast. Multiplanar CT image reconstructions were also generated. RADIATION DOSE REDUCTION: This exam was performed according to the departmental dose-optimization program which includes automated exposure control, adjustment of the mA and/or kV according to patient size and/or use of iterative reconstruction technique. COMPARISON:  CT chest, abdomen, and pelvis 06/10/2022 FINDINGS:  CT THORACIC SPINE FINDINGS Alignment: Slight scoliosis. No listhesis. Vertebrae: No acute fracture or suspicious lesion. Paraspinal and other soft tissues: Aortic atherosclerosis. Disc levels: Moderate mid to lower thoracic spondylosis. Solid bridging anterior vertebral osteophyte at T6-7 and T9-10. Facet arthrosis which is most prominent on the right at T10-11 resulting in moderate to severe right neural foraminal stenosis. CT LUMBAR SPINE FINDINGS Segmentation: Transitional thoracolumbar and lumbosacral anatomy with absent ribs at T12 and with L5 being sacralized. Alignment: Slight lumbar dextroscoliosis. Trace anterolisthesis of L2 on L3 and L3 on L4. Vertebrae: No acute fracture or suspicious lesion. Paraspinal and other soft tissues: Aortic atherosclerosis. Disc levels: Widespread advanced lumbar spondylosis. Severe disc space narrowing at L4-5 and moderate narrowing at L3-4. Widespread advanced facet arthrosis with L1-L5 facet ankylosis on the right. Are laminectomies from L2-3 through L4-5. Severe spinal stenosis and moderate left neural foraminal stenosis at L1-2 due to disc bulging and facet and ligamentum flavum hypertrophy. IMPRESSION: 1. No acute osseous abnormality in the thoracic or lumbar spine. 2. Widespread degenerative changes.  Severe spinal stenosis at L1-2. Electronically Signed   By: Sebastian Ache M.D.   On: 11/18/2023 07:31   CT Lumbar Spine Wo Contrast Result Date: 11/18/2023 CLINICAL DATA:  Back trauma, no prior imaging (Age >= 16y). Unwitnessed fall. Back pain. EXAM: CT THORACIC AND LUMBAR SPINE WITHOUT CONTRAST TECHNIQUE: Multidetector CT imaging of the thoracic and lumbar spine was performed without contrast. Multiplanar CT image reconstructions were also generated. RADIATION DOSE REDUCTION: This exam was performed according to the departmental dose-optimization program which includes automated exposure control, adjustment of the mA and/or kV according to patient size and/or use of  iterative reconstruction technique. COMPARISON:  CT chest, abdomen, and  pelvis 06/10/2022 FINDINGS: CT THORACIC SPINE FINDINGS Alignment: Slight scoliosis. No listhesis. Vertebrae: No acute fracture or suspicious lesion. Paraspinal and other soft tissues: Aortic atherosclerosis. Disc levels: Moderate mid to lower thoracic spondylosis. Solid bridging anterior vertebral osteophyte at T6-7 and T9-10. Facet arthrosis which is most prominent on the right at T10-11 resulting in moderate to severe right neural foraminal stenosis. CT LUMBAR SPINE FINDINGS Segmentation: Transitional thoracolumbar and lumbosacral anatomy with absent ribs at T12 and with L5 being sacralized. Alignment: Slight lumbar dextroscoliosis. Trace anterolisthesis of L2 on L3 and L3 on L4. Vertebrae: No acute fracture or suspicious lesion. Paraspinal and other soft tissues: Aortic atherosclerosis. Disc levels: Widespread advanced lumbar spondylosis. Severe disc space narrowing at L4-5 and moderate narrowing at L3-4. Widespread advanced facet arthrosis with L1-L5 facet ankylosis on the right. Are laminectomies from L2-3 through L4-5. Severe spinal stenosis and moderate left neural foraminal stenosis at L1-2 due to disc bulging and facet and ligamentum flavum hypertrophy. IMPRESSION: 1. No acute osseous abnormality in the thoracic or lumbar spine. 2. Widespread degenerative changes.  Severe spinal stenosis at L1-2. Electronically Signed   By: Sebastian Ache M.D.   On: 11/18/2023 07:31   CT Head Wo Contrast Result Date: 11/18/2023 CLINICAL DATA:  Head trauma, minor (Age >= 65y); Neck trauma (Age >= 65y). Unwitnessed fall. Bilateral shoulder pain and back pain. EXAM: CT HEAD WITHOUT CONTRAST CT CERVICAL SPINE WITHOUT CONTRAST TECHNIQUE: Multidetector CT imaging of the head and cervical spine was performed following the standard protocol without intravenous contrast. Multiplanar CT image reconstructions of the cervical spine were also generated. RADIATION  DOSE REDUCTION: This exam was performed according to the departmental dose-optimization program which includes automated exposure control, adjustment of the mA and/or kV according to patient size and/or use of iterative reconstruction technique. COMPARISON:  CT head and cervical spine 11/14/2023 FINDINGS: CT HEAD FINDINGS Brain: There is no evidence of an acute infarct, intracranial hemorrhage, mass, midline shift, or extra-axial fluid collection. There is moderate cerebral and cerebellar atrophy. Patchy to confluent hypodensities in the cerebral white matter bilaterally are unchanged and nonspecific but compatible with moderate chronic small vessel ischemic disease. There is likely a chronic lacunar infarct in the right thalamus. Vascular: Calcified atherosclerosis at the skull base. No hyperdense vessel. Skull: No acute fracture or suspicious lesion. Sinuses/Orbits: Minor mucosal thickening in the paranasal sinuses. Right canal wall up mastoidectomy. Bilateral cataract extraction. Other: None. CT CERVICAL SPINE FINDINGS Alignment: Reversal of the normal cervical lordosis. Unchanged trace anterolisthesis of C2 on C3 and C3 on C4. Skull base and vertebrae: No acute fracture or suspicious lesion. Soft tissues and spinal canal: No prevertebral fluid or swelling. No visible canal hematoma. Disc levels: Similar appearance of advanced cervical spondylosis with interbody ankylosis at C5-6 and C6-7 and right facet ankylosis at C3-4. Multilevel neural foraminal stenosis, particularly severe on the right at C3-4 due to uncovertebral and facet spurring. Likely moderate spinal stenosis at C4-5. Moderate ligamentous thickening and calcification posterior to the dens. Upper chest: No acute finding. Other: None. IMPRESSION: 1. No evidence of acute intracranial abnormality or cervical spine fracture. 2. Moderate chronic small vessel ischemic disease and atrophy. Electronically Signed   By: Sebastian Ache M.D.   On: 11/18/2023 07:18    CT Cervical Spine Wo Contrast Result Date: 11/18/2023 CLINICAL DATA:  Head trauma, minor (Age >= 65y); Neck trauma (Age >= 65y). Unwitnessed fall. Bilateral shoulder pain and back pain. EXAM: CT HEAD WITHOUT CONTRAST CT CERVICAL SPINE WITHOUT CONTRAST TECHNIQUE: Multidetector  CT imaging of the head and cervical spine was performed following the standard protocol without intravenous contrast. Multiplanar CT image reconstructions of the cervical spine were also generated. RADIATION DOSE REDUCTION: This exam was performed according to the departmental dose-optimization program which includes automated exposure control, adjustment of the mA and/or kV according to patient size and/or use of iterative reconstruction technique. COMPARISON:  CT head and cervical spine 11/14/2023 FINDINGS: CT HEAD FINDINGS Brain: There is no evidence of an acute infarct, intracranial hemorrhage, mass, midline shift, or extra-axial fluid collection. There is moderate cerebral and cerebellar atrophy. Patchy to confluent hypodensities in the cerebral white matter bilaterally are unchanged and nonspecific but compatible with moderate chronic small vessel ischemic disease. There is likely a chronic lacunar infarct in the right thalamus. Vascular: Calcified atherosclerosis at the skull base. No hyperdense vessel. Skull: No acute fracture or suspicious lesion. Sinuses/Orbits: Minor mucosal thickening in the paranasal sinuses. Right canal wall up mastoidectomy. Bilateral cataract extraction. Other: None. CT CERVICAL SPINE FINDINGS Alignment: Reversal of the normal cervical lordosis. Unchanged trace anterolisthesis of C2 on C3 and C3 on C4. Skull base and vertebrae: No acute fracture or suspicious lesion. Soft tissues and spinal canal: No prevertebral fluid or swelling. No visible canal hematoma. Disc levels: Similar appearance of advanced cervical spondylosis with interbody ankylosis at C5-6 and C6-7 and right facet ankylosis at C3-4.  Multilevel neural foraminal stenosis, particularly severe on the right at C3-4 due to uncovertebral and facet spurring. Likely moderate spinal stenosis at C4-5. Moderate ligamentous thickening and calcification posterior to the dens. Upper chest: No acute finding. Other: None. IMPRESSION: 1. No evidence of acute intracranial abnormality or cervical spine fracture. 2. Moderate chronic small vessel ischemic disease and atrophy. Electronically Signed   By: Sebastian Ache M.D.   On: 11/18/2023 07:18   DG Shoulder Left Result Date: 11/18/2023 CLINICAL DATA:  Fall with bilateral shoulder pain EXAM: LEFT SHOULDER - 3 VIEW COMPARISON:  None Available. FINDINGS: Glenohumeral and acromioclavicular degenerative joint narrowing and spurring. No acute fracture, dislocation, or separation. IMPRESSION: Osteoarthritis without acute finding. Electronically Signed   By: Tiburcio Pea M.D.   On: 11/18/2023 06:40   DG Shoulder Right Result Date: 11/18/2023 CLINICAL DATA:  Bilateral shoulder pain after fall. EXAM: RIGHT SHOULDER - 3 VIEW COMPARISON:  None Available. FINDINGS: No acute fracture or dislocation. Joint space narrowing and degenerative spurring at the acromioclavicular and glenohumeral joints. Generalized calcification above the humeral head. IMPRESSION: No acute finding. Osteoarthritis. Electronically Signed   By: Tiburcio Pea M.D.   On: 11/18/2023 06:39   CT HEAD WO CONTRAST Result Date: 11/14/2023 CLINICAL DATA:  Fall on blood thinners with no visible injury. EXAM: CT HEAD WITHOUT CONTRAST CT CERVICAL SPINE WITHOUT CONTRAST TECHNIQUE: Multidetector CT imaging of the head and cervical spine was performed following the standard protocol without intravenous contrast. Multiplanar CT image reconstructions of the cervical spine were also generated. RADIATION DOSE REDUCTION: This exam was performed according to the departmental dose-optimization program which includes automated exposure control, adjustment of the mA  and/or kV according to patient size and/or use of iterative reconstruction technique. COMPARISON:  Cervical spine CT 10/08/2020 FINDINGS: CT HEAD FINDINGS Brain: No evidence of acute infarction, hemorrhage, hydrocephalus, extra-axial collection or mass lesion/mass effect. Generalized brain atrophy. Periventricular chronic small vessel ischemia. Vascular: No hyperdense vessel or unexpected calcification. Skull: No acute fracture Sinuses/Orbits: No evidence of injury CT CERVICAL SPINE FINDINGS Alignment: No traumatic malalignment, reversal of cervical lordosis Skull base and vertebrae: No acute  fracture. No primary bone lesion or focal pathologic process. Soft tissues and spinal canal: No prevertebral fluid or swelling. No visible canal hematoma. Disc levels: Bulky degenerative spurring with C3-4 facet ankylosis and C5-C7 intervertebral ankylosis. Uncovertebral spurring causes foraminal impingement especially at C2-3 and C3-4. Upper chest: No visible injury IMPRESSION: No evidence of acute intracranial or cervical spine injury. Prominent brain atrophy and cervical spine degeneration. Electronically Signed   By: Tiburcio Pea M.D.   On: 11/14/2023 09:36   CT CERVICAL SPINE WO CONTRAST Result Date: 11/14/2023 CLINICAL DATA:  Fall on blood thinners with no visible injury. EXAM: CT HEAD WITHOUT CONTRAST CT CERVICAL SPINE WITHOUT CONTRAST TECHNIQUE: Multidetector CT imaging of the head and cervical spine was performed following the standard protocol without intravenous contrast. Multiplanar CT image reconstructions of the cervical spine were also generated. RADIATION DOSE REDUCTION: This exam was performed according to the departmental dose-optimization program which includes automated exposure control, adjustment of the mA and/or kV according to patient size and/or use of iterative reconstruction technique. COMPARISON:  Cervical spine CT 10/08/2020 FINDINGS: CT HEAD FINDINGS Brain: No evidence of acute infarction,  hemorrhage, hydrocephalus, extra-axial collection or mass lesion/mass effect. Generalized brain atrophy. Periventricular chronic small vessel ischemia. Vascular: No hyperdense vessel or unexpected calcification. Skull: No acute fracture Sinuses/Orbits: No evidence of injury CT CERVICAL SPINE FINDINGS Alignment: No traumatic malalignment, reversal of cervical lordosis Skull base and vertebrae: No acute fracture. No primary bone lesion or focal pathologic process. Soft tissues and spinal canal: No prevertebral fluid or swelling. No visible canal hematoma. Disc levels: Bulky degenerative spurring with C3-4 facet ankylosis and C5-C7 intervertebral ankylosis. Uncovertebral spurring causes foraminal impingement especially at C2-3 and C3-4. Upper chest: No visible injury IMPRESSION: No evidence of acute intracranial or cervical spine injury. Prominent brain atrophy and cervical spine degeneration. Electronically Signed   By: Tiburcio Pea M.D.   On: 11/14/2023 09:36    Microbiology: Results for orders placed or performed during the hospital encounter of 11/18/23  Urine Culture     Status: Abnormal   Collection Time: 11/18/23  9:53 AM   Specimen: Urine, Clean Catch  Result Value Ref Range Status   Specimen Description URINE, CLEAN CATCH  Final   Special Requests   Final    NONE Performed at [facility], [address] [place], [place] [address]    Culture MULTIPLE SPECIES PRESENT, SUGGEST RECOLLECTION (A)  Final   Report Status 11/19/2023 FINAL  Final    Labs: CBC: Recent Labs  Lab 11/18/23 0607 11/19/23 0435 11/20/23 0415  WBC 8.8 6.5 11.4*  NEUTROABS 6.2  --   --   HGB 13.7 12.9* 13.1  HCT 41.5 39.2 39.5  MCV 97.2 95.6 97.3  PLT 170 154 148*   Basic Metabolic Panel: Recent Labs  Lab 11/18/23 0607 11/19/23 0435 11/20/23 0415  NA 139 138 138  K 3.6 3.5 3.7  CL 100 102 99  CO2 27 27 30   GLUCOSE 108* 99 114*  BUN 16 13 13   CREATININE 1.35* 1.10 1.08  CALCIUM 9.4 9.0 9.3   MG 1.9 2.0  --    Liver Function Tests: Recent Labs  Lab 11/19/23 0435  AST 17  ALT 11  ALKPHOS 89  BILITOT 1.8*  PROT 6.3*  ALBUMIN 3.2*   CBG: No results for input(s): "GLUCAP" in the last 168 hours.  Discharge time spent: less than 30 minutes.  Signed: Rickey Barbara, MD [facility] 11/21/2023

## 2023-11-21 NOTE — Progress Notes (Signed)
 Speech Language Pathology Treatment: Dysphagia  Patient Details Name: Dylan [name] MRN: [id number] DOB: 13-Jul-1926 Today's Date: 11/21/2023 Time: 9147-8295 SLP Time Calculation (min) (ACUTE ONLY): 30 min  Assessment / Plan / Recommendation Clinical Impression  Patient seen for f/u after MBS previous date. Son present. Reviewed results of MBS, recommendations and plan with both patient and son. Patient verbalized understanding of diet recommendations but also reported that his hope is to not be on thickened liquids long term. Patient able to consume dysphagia 3 solids and nectar thick liquids under skilled observation without overt s/s of aspiration and with min verbal cues for slowed rate of intake and small bites/sips. Plan is for d/c to SNF today. Reviewed MBS and although anatomical variation (osteophyte) is impeding full epiglottic deflection and causing dysphagia, suspect that patient was previously compensating, and now that functional reserve is down with multiple falls, head injury, hospitalization, UTI, he is not longer able to compensate effectively. Hopefull that with improved strength overall, he will be able to safely return to thin liquids. Recommend repeat MBS as OP in 2-3 weeks with SLP, PT, OT treatment at SNF to determine potential to upgrade as aspiration was partially silent in nature.    HPI HPI: Patient is a 89 y.o. male with PMH: a-fib, orthostatic hypotension, DM, ,HTN, CHF, CHF, CKD stage IIIa. He resides at [facility]. He presented to the hospital on 11/18/23 following a fall. Per son's report, patient has had approximately 8 falls in the past three weeks. CT head CT C-spine with no acute intracranial abnormality or cervical spine fracture.  SLP ordered to evaluate swallowing after daughter and son both witnessed him to cough after drinking liquids and while eating.      SLP Plan  Continue with current plan of care (if patient does not d/c today)       Recommendations for follow up therapy are one component of a multi-disciplinary discharge planning process, led by the attending physician.  Recommendations may be updated based on patient status, additional functional criteria and insurance authorization.    Recommendations  Diet recommendations: Dysphagia 3 (mechanical soft);Nectar-thick liquid Liquids provided via: Cup Medication Administration: Whole meds with puree Supervision: Patient able to self feed;Intermittent supervision to cue for compensatory strategies Compensations: Slow rate;Small sips/bites Postural Changes and/or Swallow Maneuvers: Seated upright 90 degrees                  Oral care BID   Frequent or constant Supervision/Assistance Dysphagia, oropharyngeal phase (R13.12)     Continue with current plan of care (if patient does not d/c today)   Kriston Pasquarello MA, CCC-SLP   [name]  11/21/2023, 1:21 PM

## 2023-11-21 NOTE — Progress Notes (Signed)
 Mobility Specialist Progress Note:    11/21/23 1000  Mobility  Activity Transferred from chair to bed  Level of Assistance Minimal assist, patient does 75% or more  Assistive Device Front wheel walker  Distance Ambulated (ft) 4 ft  Activity Response Tolerated well  Mobility Referral Yes  Mobility visit 1 Mobility  Mobility Specialist Start Time (ACUTE ONLY) [id number]  Mobility Specialist Stop Time (ACUTE ONLY) [id number]  Mobility Specialist Time Calculation (min) (ACUTE ONLY) 9 min   Pt received in chair, requesting assistance back to bed. Required minA to stand/pivot to bed. No complaints throughout. Pt left in bed with call bell and all needs met. Bed alarm on and family present.  Dylan [name] Mobility Specialist Please contact via Special educational needs teacher or Rehab office at [phone]

## 2023-11-21 NOTE — TOC Transition Note (Signed)
 Transition of Care [facility]) - Discharge Note   Patient Details  Name: Dylan [name] MRN: [id number] Date of Birth: [birth year]  Transition of Care [facility]) CM/SW Contact:  Deatra Robinson, LCSW Phone Number: 11/21/2023, 1:23 PM   Clinical Narrative:  Pt for dc to [facility] today. Drucie Opitz details received and provided to [place] at [facility] who confirmed they are prepared to admit pt to room 207. Pt's son at bedside and agreeable to dc. RN provided with number for report and [facility] arranged for transport. SW signing off at dc.   [employer] NGE#[id number], valid 4/3-4/9.   Dellie Burns, MSW, LCSW [phone] (coverage)      Final next level of care: Skilled Nursing Facility Barriers to Discharge: Barriers Resolved   Patient Goals and CMS Choice Patient states their goals for this hospitalization and ongoing recovery are:: SNF   Choice offered to / list presented to : Adult Children [place] ownership interest in [facility].provided to:: Adult Children    Discharge Placement              Patient chooses bed at: [facility] Patient to be transferred to facility by: [facility] Name of family member notified: Doug/Son Patient and family notified of of transfer: 11/21/23  Discharge Plan and Services Additional resources added to the After Visit Summary for   In-house Referral: Clinical Social Work   Post Acute Care Choice: Skilled Nursing Facility                               Social Drivers of Health (SDOH) Interventions SDOH Screenings   Food Insecurity: No Food Insecurity (11/18/2023)  Housing: Low Risk  (11/18/2023)  Transportation Needs: No Transportation Needs (11/18/2023)  Utilities: Not At Risk (11/18/2023)  Social Connections: Patient Declined (11/18/2023)  Tobacco Use: Medium Risk (11/18/2023)     Readmission Risk Interventions     No data to display

## 2023-11-21 NOTE — Telephone Encounter (Signed)
 Pt's son aware.

## 2023-11-24 DIAGNOSIS — I5032 Chronic diastolic (congestive) heart failure: Secondary | ICD-10-CM | POA: Diagnosis not present

## 2023-11-24 DIAGNOSIS — J310 Chronic rhinitis: Secondary | ICD-10-CM | POA: Diagnosis not present

## 2023-11-24 DIAGNOSIS — R2681 Unsteadiness on feet: Secondary | ICD-10-CM | POA: Diagnosis not present

## 2023-11-24 DIAGNOSIS — I951 Orthostatic hypotension: Secondary | ICD-10-CM | POA: Diagnosis not present

## 2023-11-24 DIAGNOSIS — E1151 Type 2 diabetes mellitus with diabetic peripheral angiopathy without gangrene: Secondary | ICD-10-CM | POA: Diagnosis not present

## 2023-11-24 DIAGNOSIS — E662 Morbid (severe) obesity with alveolar hypoventilation: Secondary | ICD-10-CM | POA: Diagnosis not present

## 2023-11-24 DIAGNOSIS — I4891 Unspecified atrial fibrillation: Secondary | ICD-10-CM | POA: Diagnosis not present

## 2023-11-25 ENCOUNTER — Telehealth: Payer: Self-pay | Admitting: Internal Medicine

## 2023-11-25 ENCOUNTER — Other Ambulatory Visit (HOSPITAL_COMMUNITY)

## 2023-11-25 NOTE — Telephone Encounter (Signed)
  Patient's son called. He said the patient is staying at a long-term rehab facility, and the PPM monitor was not brought there since they don't have many outlets available. He wants to know if that's okay, as the patient will be staying there for 2-3 more weeks.  Called the device clinic and spoke with Baird Lyons, [place]. He said it will be fine if the monitor is not with the patient, and as long as the monitor is within 15 feet of the patient, the signal will be okay.  Provided this information to the patient's son, and he understood.

## 2023-11-27 DIAGNOSIS — S81801A Unspecified open wound, right lower leg, initial encounter: Secondary | ICD-10-CM | POA: Diagnosis not present

## 2023-11-28 ENCOUNTER — Encounter (HOSPITAL_COMMUNITY)

## 2023-12-02 DIAGNOSIS — R059 Cough, unspecified: Secondary | ICD-10-CM | POA: Diagnosis not present

## 2023-12-03 ENCOUNTER — Other Ambulatory Visit (HOSPITAL_COMMUNITY): Payer: Self-pay | Admitting: *Deleted

## 2023-12-03 DIAGNOSIS — R1310 Dysphagia, unspecified: Secondary | ICD-10-CM

## 2023-12-04 DIAGNOSIS — S81801A Unspecified open wound, right lower leg, initial encounter: Secondary | ICD-10-CM | POA: Diagnosis not present

## 2023-12-05 ENCOUNTER — Emergency Department (HOSPITAL_COMMUNITY)
Admission: EM | Admit: 2023-12-05 | Discharge: 2023-12-05 | Disposition: A | Discharge status: Home / Self Care | Attending: Emergency Medicine | Admitting: Emergency Medicine

## 2023-12-05 ENCOUNTER — Encounter (HOSPITAL_COMMUNITY): Payer: Self-pay

## 2023-12-05 ENCOUNTER — Emergency Department (HOSPITAL_COMMUNITY)

## 2023-12-05 ENCOUNTER — Other Ambulatory Visit: Payer: Self-pay

## 2023-12-05 DIAGNOSIS — Z79899 Other long term (current) drug therapy: Secondary | ICD-10-CM | POA: Diagnosis not present

## 2023-12-05 DIAGNOSIS — E119 Type 2 diabetes mellitus without complications: Secondary | ICD-10-CM | POA: Diagnosis not present

## 2023-12-05 DIAGNOSIS — Y92129 Unspecified place in nursing home as the place of occurrence of the external cause: Secondary | ICD-10-CM | POA: Diagnosis not present

## 2023-12-05 DIAGNOSIS — W19XXXA Unspecified fall, initial encounter: Secondary | ICD-10-CM

## 2023-12-05 DIAGNOSIS — R0902 Hypoxemia: Secondary | ICD-10-CM | POA: Diagnosis not present

## 2023-12-05 DIAGNOSIS — S0101XA Laceration without foreign body of scalp, initial encounter: Secondary | ICD-10-CM | POA: Insufficient documentation

## 2023-12-05 DIAGNOSIS — Z7901 Long term (current) use of anticoagulants: Secondary | ICD-10-CM | POA: Diagnosis not present

## 2023-12-05 DIAGNOSIS — S50312A Abrasion of left elbow, initial encounter: Secondary | ICD-10-CM | POA: Insufficient documentation

## 2023-12-05 DIAGNOSIS — I4891 Unspecified atrial fibrillation: Secondary | ICD-10-CM | POA: Diagnosis not present

## 2023-12-05 DIAGNOSIS — R000 Tachycardia, unspecified: Secondary | ICD-10-CM | POA: Diagnosis not present

## 2023-12-05 DIAGNOSIS — F0390 Unspecified dementia without behavioral disturbance: Secondary | ICD-10-CM | POA: Diagnosis not present

## 2023-12-05 DIAGNOSIS — W01198A Fall on same level from slipping, tripping and stumbling with subsequent striking against other object, initial encounter: Secondary | ICD-10-CM | POA: Insufficient documentation

## 2023-12-05 DIAGNOSIS — M542 Cervicalgia: Secondary | ICD-10-CM | POA: Diagnosis not present

## 2023-12-05 DIAGNOSIS — S0990XA Unspecified injury of head, initial encounter: Secondary | ICD-10-CM | POA: Diagnosis not present

## 2023-12-05 DIAGNOSIS — I10 Essential (primary) hypertension: Secondary | ICD-10-CM | POA: Diagnosis not present

## 2023-12-05 DIAGNOSIS — S199XXA Unspecified injury of neck, initial encounter: Secondary | ICD-10-CM | POA: Diagnosis not present

## 2023-12-05 NOTE — ED Notes (Signed)
[facility] CALLED  °

## 2023-12-05 NOTE — ED Notes (Signed)
 Patient transported to CT

## 2023-12-05 NOTE — ED Triage Notes (Signed)
 Pt bibgcems from [facility]. Patient had a fall and hit the back of hs head. No LOC. Pt c/o neck pain but unable to tolerate c- collar. Small skin tear to left arm  160/90 Hr 80-120 Cbg 108 98% 2l

## 2023-12-05 NOTE — ED Provider Notes (Signed)
  EMERGENCY DEPARTMENT AT [facility] Provider Note   CSN: [id number] Arrival date & time: 12/05/23  0146     History  Chief Complaint  Patient presents with   Dylan [name] is a 89 y.o. male.  The history is provided by the EMS personnel, the patient and a relative. The history is limited by the condition of the patient (Dementia).  He has history of hypertension, diabetes, hyperlipidemia, permanent atrial fibrillation anticoagulated on apixaban and comes in by ambulance following a fall at the nursing home where he is a resident.  He arrived as a level 2 trauma because of a fall while on anticoagulants.  He apparently slipped when he got out of bed going to the bathroom.  He hit the back of his head and has a laceration there, also suffered abrasion to his left elbow.  Patient denies other injury, although EMS states that he had some tenderness to palpation of his neck and was unable to tolerate a cervical collar.  His daughter states that he has been having frequent falls estimating 8 falls in the last month.   Home Medications Prior to Admission medications   Medication Sig Start Date End Date Taking? Authorizing Provider  acetaminophen (TYLENOL) 500 MG tablet Take 500 mg by mouth daily in the afternoon.    [provider]  apixaban Everlene Balls) 5 MG TABS tablet Take 1 tablet by mouth twice daily 02/08/23   Laurey Morale, MD  docusate sodium (COLACE) 100 MG capsule Take 100 mg by mouth See admin instructions. 1 capsule every week on Wednesday    [provider]  furosemide (LASIX) 40 MG tablet Take 1 tablet (40 mg total) by mouth daily as needed for fluid or edema. 11/21/23   Jerald Kief, MD  ibuprofen (ADVIL) 400 MG tablet Take 1 tablet (400 mg total) by mouth every 6 (six) hours as needed for mild pain (pain score 1-3) (or Fever >/= 101). 11/21/23   Jerald Kief, MD  lidocaine (LIDODERM) 5 % Place 1 patch onto the skin daily. To low  back as needed Remove & Discard patch within 12 hours or as directed by MD 11/21/23   Jerald Kief, MD  metoprolol succinate (TOPROL-XL) 25 MG 24 hr tablet Take 0.5 tablets (12.5 mg total) by mouth daily. 11/22/23 12/22/23  Jerald Kief, MD  midodrine (PROAMATINE) 5 MG tablet Take 15 mg by mouth 3 (three) times daily with meals.    [provider]  oxyCODONE (OXY IR/ROXICODONE) 5 MG immediate release tablet Take 1 tablet (5 mg total) by mouth every 4 (four) hours as needed for moderate pain (pain score 4-6). 11/21/23   Jerald Kief, MD  rosuvastatin (CRESTOR) 10 MG tablet Take 1 tablet by mouth once daily Patient taking differently: Take 10 mg by mouth daily. 02/11/23   Laurey Morale, MD      Allergies    Keflex [cephalexin]    Review of Systems   Review of Systems  Unable to perform ROS: Dementia    Physical Exam Updated Vital Signs BP (!) 158/92 (BP Location: Right Arm)   Pulse (!) 103   Resp 14   SpO2 97%  Physical Exam Vitals and nursing note reviewed.   89 year old male, resting comfortably and in no acute distress. Vital signs are significant for borderline elevated heart rate and elevated blood pressure. Oxygen saturation is 97%, which is normal. Head is normocephalic.  2 small lacerations are present on the occiput. PERRLA, EOMI.  Neck is nontender. Back is nontender. Lungs are clear without rales, wheezes, or rhonchi. Chest is nontender. Heart has regular rate and rhythm without murmur. Abdomen is soft, flat, nontender. Pelvis is stable and nontender. Extremities have no cyanosis or edema, full range of motion is present.  Minor abrasion is noted on the dorsal surface of the left elbow without swelling or deformity. Skin is warm and dry without rash. Neurologic: Awake and alert, oriented to person and place but not time, cranial nerves are intact, moves all extremities equally.  ED Results / Procedures / Treatments    Radiology CT HEAD WO  CONTRAST Result Date: 12/05/2023 CLINICAL DATA:  Head trauma, moderate-severe; Polytrauma, blunt EXAM: CT HEAD WITHOUT CONTRAST CT CERVICAL SPINE WITHOUT CONTRAST TECHNIQUE: Multidetector CT imaging of the head and cervical spine was performed following the standard protocol without intravenous contrast. Multiplanar CT image reconstructions of the cervical spine were also generated. RADIATION DOSE REDUCTION: This exam was performed according to the departmental dose-optimization program which includes automated exposure control, adjustment of the mA and/or kV according to patient size and/or use of iterative reconstruction technique. COMPARISON:  None Available. FINDINGS: CT HEAD FINDINGS Brain: No evidence of acute infarction, hemorrhage, hydrocephalus, extra-axial collection or mass lesion/mass effect. Patchy white matter hypodensities are nonspecific but compatible with chronic microvascular ischemic disease. Calcific atherosclerosis. Vascular: Calcific atherosclerosis. Skull: No acute fracture. Sinuses/Orbits: Mostly clear sinuses. No acute orbital findings. CT CERVICAL SPINE FINDINGS Alignment: No substantial sagittal subluxation. Skull base and vertebrae: No acute fracture. Soft tissues and spinal canal: No prevertebral fluid or swelling. No visible canal hematoma. Disc levels: Severe multilevel degenerative change. Facet uncovertebral hypertrophy contributes to severe degrees of neural foraminal stenosis. Endplate spurring at multiple levels. Upper chest: Visualized lung apices are clear. IMPRESSION: No evidence of acute abnormality intracranially or in the cervical spine. Electronically Signed   By: Feliberto Harts M.D.   On: 12/05/2023 02:19   CT CERVICAL SPINE WO CONTRAST Result Date: 12/05/2023 CLINICAL DATA:  Head trauma, moderate-severe; Polytrauma, blunt EXAM: CT HEAD WITHOUT CONTRAST CT CERVICAL SPINE WITHOUT CONTRAST TECHNIQUE: Multidetector CT imaging of the head and cervical spine was  performed following the standard protocol without intravenous contrast. Multiplanar CT image reconstructions of the cervical spine were also generated. RADIATION DOSE REDUCTION: This exam was performed according to the departmental dose-optimization program which includes automated exposure control, adjustment of the mA and/or kV according to patient size and/or use of iterative reconstruction technique. COMPARISON:  None Available. FINDINGS: CT HEAD FINDINGS Brain: No evidence of acute infarction, hemorrhage, hydrocephalus, extra-axial collection or mass lesion/mass effect. Patchy white matter hypodensities are nonspecific but compatible with chronic microvascular ischemic disease. Calcific atherosclerosis. Vascular: Calcific atherosclerosis. Skull: No acute fracture. Sinuses/Orbits: Mostly clear sinuses. No acute orbital findings. CT CERVICAL SPINE FINDINGS Alignment: No substantial sagittal subluxation. Skull base and vertebrae: No acute fracture. Soft tissues and spinal canal: No prevertebral fluid or swelling. No visible canal hematoma. Disc levels: Severe multilevel degenerative change. Facet uncovertebral hypertrophy contributes to severe degrees of neural foraminal stenosis. Endplate spurring at multiple levels. Upper chest: Visualized lung apices are clear. IMPRESSION: No evidence of acute abnormality intracranially or in the cervical spine. Electronically Signed   By: Feliberto Harts M.D.   On: 12/05/2023 02:19    Procedures .Laceration Repair  Date/Time: 12/05/2023 3:11 AM  Performed by: Dione Booze, MD Authorized by: Dione Booze, MD   Consent:    Consent obtained:  Verbal   Consent given by:  Patient   Risks discussed:  Infection, pain and poor wound healing   Alternatives discussed:  No treatment Universal protocol:    Procedure explained and questions answered to patient or proxy's satisfaction: yes     Relevant documents present and verified: yes     Test results available: yes      Imaging studies available: yes     Required blood products, implants, devices, and special equipment available: yes     Site/side marked: yes     Immediately prior to procedure, a time out was called: yes     Patient identity confirmed:  Verbally with patient and arm band Anesthesia:    Anesthesia method:  None Laceration details:    Location:  Scalp   Scalp location:  Occipital   Length (cm):  2   Depth (mm):  3 Pre-procedure details:    Preparation:  Patient was prepped and draped in usual sterile fashion and imaging obtained to evaluate for foreign bodies Exploration:    Limited defect created (wound extended): no     Hemostasis achieved with:  Direct pressure   Imaging obtained: x-ray     Imaging outcome: foreign body not noted     Wound exploration: entire depth of wound visualized     Wound extent: no foreign body and no underlying fracture     Contaminated: no   Treatment:    Area cleansed with:  Saline   Amount of cleaning:  Standard   Debridement:  None   Undermining:  None   Scar revision: no   Skin repair:    Repair method:  Staples   Number of staples:  4 Approximation:    Approximation:  Close Repair type:    Repair type:  Simple Post-procedure details:    Dressing:  Open (no dressing)   Procedure completion:  Tolerated well, no immediate complications   Cardiac monitor shows atrial fibrillation with controlled ventricular response, per my interpretation.  Medications Ordered in ED Medications - No data to display  ED Course/ Medical Decision Making/ A&P                                 Medical Decision Making Amount and/or Complexity of Data Reviewed Radiology: ordered.   Fall with head injury and patient who is anticoagulated on apixaban.  I have ordered CT scans of head and cervical spine.  This appears to be a mechanical fall, no need for laboratory evaluation.  I have reviewed his past records, and note hospitalization 11/18/2023-11/21/2023 for  frequent falls and noted to have a UTI which was treated.  CT scans show no evidence of acute injury.  Have independently viewed the images, and agree with the radiologist's interpretation.  I have closed the 2 lacerations with staples.  I am discharging him with instructions to have staples removed in 7 days.  Also, I am recommending a discussion with his primary care provider or cardiologist whether, in light of multiple falls, his risk of anticoagulation is greater than the risk of stroke if he comes off of anticoagulation.  CRITICAL CARE Performed by: Alissa April Total critical care time: 40 minutes Critical care time was exclusive of separately billable procedures and treating other patients. Critical care was necessary to treat or prevent imminent or life-threatening deterioration. Critical care was time spent personally by me on the following activities: development of treatment plan with  patient and/or surrogate as well as nursing, discussions with consultants, evaluation of patient's response to treatment, examination of patient, obtaining history from patient or surrogate, ordering and performing treatments and interventions, ordering and review of laboratory studies, ordering and review of radiographic studies, pulse oximetry and re-evaluation of patient's condition.  Final Clinical Impression(s) / ED Diagnoses Final diagnoses:  Fall at nursing home, initial encounter  Chronic anticoagulation  Laceration of scalp, initial encounter    Rx / DC Orders ED Discharge Orders     None         Alissa April, MD 12/05/23 [phone]

## 2023-12-05 NOTE — ED Notes (Signed)
 ..Trauma Response Nurse Documentation   D[name] is a 89 y.o. male arriving to [name] via EMS  On Eliquis (apixaban) daily. Trauma was activated as a Level 2 by charge nurse based on the following trauma criteria Elderly patients > 65 with head trauma on anti-coagulation (excluding ASA).  Patient cleared for CT by Dr. Candelaria [name]. Pt transported to CT with trauma response nurse present to monitor. RN remained with the patient throughout their absence from the department for clinical observation.   GCS 15.  Trauma MD Arrival Time: N/A.  History   Past Medical History:  Diagnosis Date   COVID-19    Diabetes mellitus without complication (HCC)    Dyspnea    History of hiatal hernia    Hyperglycemia    Hyperlipidemia    Hypertension    Lung nodule    Myocardial infarction [name])    Presence of permanent cardiac pacemaker      Past Surgical History:  Procedure Laterality Date   APPENDECTOMY  1956   BACK SURGERY  2008   CARDIAC CATHETERIZATION  1995   negative results   CARDIOVERSION N/A 08/05/2017   Procedure: CARDIOVERSION;  Surgeon: [facility] Eisenmenger, MD;  Location: [name];  Service: Cardiovascular;  Laterality: N/A;   CARDIOVERSION N/A 09/12/2017   Procedure: CARDIOVERSION;  Surgeon: [facility] Eisenmenger, MD;  Location: [name];  Service: Cardiovascular;  Laterality: N/A;   CARDIOVERSION N/A 09/15/2021   Procedure: CARDIOVERSION;  Surgeon: [facility] Eisenmenger, MD;  Location: [name];  Service: Cardiovascular;  Laterality: N/A;   CARDIOVERSION N/A 10/12/2021   Procedure: CARDIOVERSION;  Surgeon: [facility] Eisenmenger, MD;  Location: [name];  Service: Cardiovascular;  Laterality: N/A;   COLON SURGERY  07/09/12   HERNIA REPAIR  1952   HOT HEMOSTASIS  01/08/2012   Procedure: HOT HEMOSTASIS (ARGON PLASMA COAGULATION/BICAP);  Surgeon: [facility] Hilts, MD;  Location: [name];  Service: [facility];  Laterality: N/A;   LAPAROSCOPIC ILEOCECECTOMY  07/09/2012    Procedure: LAPAROSCOPIC ILEOCECECTOMY;  Surgeon: [facility] Bucy, MD;  Location: [name];  Service: General;  Laterality: N/A;  Laparoscopic Ileocecectomy   PACEMAKER IMPLANT N/A 10/05/2017   Procedure: PACEMAKER IMPLANT;  Surgeon: [facility] Goodwill, MD;  Location: [name];  Service: Cardiovascular;  Laterality: N/A;   REPLACEMENT TOTAL KNEE  2010   TEE WITHOUT CARDIOVERSION N/A 08/05/2017   Procedure: TRANSESOPHAGEAL ECHOCARDIOGRAM (TEE);  Surgeon: [facility] Eisenmenger, MD;  Location: [name];  Service: Cardiovascular;  Laterality: N/A;   TEE WITHOUT CARDIOVERSION N/A 09/12/2017   Procedure: TRANSESOPHAGEAL ECHOCARDIOGRAM (TEE);  Surgeon: [facility] Eisenmenger, MD;  Location: [name];  Service: Cardiovascular;  Laterality: N/A;       Initial Focused Assessment (If applicable, or please see trauma documentation):   CT's Completed:   CT Head and CT C-Spine   Interventions:   Plan for disposition:  Other TBD  Consults completed:  none   Event Summary: Pt transported from [facility] after fall while walking, pt reports hitting back of head on floor, Pt does take Eliquis. 2 small lacs noted to back of head, bleeding controlled, abrasion to L elbow, bandage applied, bleeding controlled. Pt very HOH, 2L 02 by [place]. Pt's daughter at bedside, reports pt has had several falls recently. Pt transported to/from CT without incident on CCM. Pt noted to have congested cough, per daughter pt now has thickened liquids due to aspiration.  Pt continue to monitored awaiting dispo.   MTP Summary (If applicable): N/A  Bedside handoff with ED RN [name].    [name]  Trauma Response RN  Please call TRN at [phone] for further assistance.

## 2023-12-05 NOTE — Discharge Instructions (Signed)
 Please discuss with your cardiologist or primary care provider whether your risk of injury from being on the blood thinner is greater than the risk of stroke if you come off the blood thinner.

## 2023-12-07 DIAGNOSIS — R2681 Unsteadiness on feet: Secondary | ICD-10-CM | POA: Diagnosis not present

## 2023-12-07 DIAGNOSIS — E1151 Type 2 diabetes mellitus with diabetic peripheral angiopathy without gangrene: Secondary | ICD-10-CM | POA: Diagnosis not present

## 2023-12-07 DIAGNOSIS — I5032 Chronic diastolic (congestive) heart failure: Secondary | ICD-10-CM | POA: Diagnosis not present

## 2023-12-07 DIAGNOSIS — I480 Paroxysmal atrial fibrillation: Secondary | ICD-10-CM | POA: Diagnosis not present

## 2023-12-11 ENCOUNTER — Encounter (HOSPITAL_COMMUNITY): Admitting: Cardiology

## 2023-12-11 DIAGNOSIS — S81801A Unspecified open wound, right lower leg, initial encounter: Secondary | ICD-10-CM | POA: Diagnosis not present

## 2023-12-17 ENCOUNTER — Ambulatory Visit (HOSPITAL_COMMUNITY)
Admission: RE | Admit: 2023-12-17 | Discharge: 2023-12-17 | Disposition: A | Discharge status: Home / Self Care | Source: Ambulatory Visit | Attending: Internal Medicine | Admitting: Internal Medicine

## 2023-12-17 DIAGNOSIS — R1310 Dysphagia, unspecified: Secondary | ICD-10-CM | POA: Insufficient documentation

## 2023-12-17 DIAGNOSIS — M47812 Spondylosis without myelopathy or radiculopathy, cervical region: Secondary | ICD-10-CM | POA: Diagnosis not present

## 2023-12-17 NOTE — Evaluation (Signed)
 Modified Barium Swallow Study  Patient Details  Name: Dylan [name] MRN: [id number] Date of Birth: Feb 16, 1926  Today's Date: 12/17/2023  Modified Barium Swallow completed.  Full report located under Chart Review in the Imaging Section.  History of Present Illness Pt is a 89 y.o. male known to SLP services with recent MBS on 11/20/23. There was both sensed and silent aspiration of thin liquids before the swallow  He returns for a repeat MBS as an OP to determine any changes in swallow physiology since last exam.  He currently resides at [facility].  PMHx a-fib, orthostatic hypotension, DM, HTN, CHF, CHF, CKD stage IIIa. He has had multiple falls in recent months. CT cervical spine 12/05/23: Severe multilevel degenerative change. Endplate spurring at multiple levels. His son Georgette Kins is with him today. Pt has been drinking nectar-thick liquids since 4/2 and is hoping diet will be liberalized today.   Clinical Impression Mr. Biggers presents with improved oropharyngeal swallow, likely due to improved overall strength and conditioning since his prior MBS while he was an inpatient. Today, there was one notable incident of aspiration that was silent and occurred when swallowing larger boluses. Trials of chin tuck offered extra protection with no aspiration observed. Overall, mastication was mildly prolonged but functional. There was mild vallecular and pyriform residue after the swallows. Degenerative changes in cervical vertebrae/anterior spurring no longer appeared to be an obstacle that the pt's musculature couldn't overcome. Recommend that Mr. Daigneault resume thin liquids - a chin tuck will offer improved protection against aspiration.  Continue regular solids.  Future hospitalizations and deconditioning may lead to changes in swallowing safety as occurred during April admission this year; for now, swallowing appears to have recovered. Results and recs were D/W Mr. Taraba and his son, Georgette Kins, after the study. They  verbalized agreement with plan. Factors that may increase risk of adverse event in presence of aspiration Roderick Civatte & Jessy Morocco 2021): Limited mobility  Swallow Evaluation Recommendations Recommendations: PO diet PO Diet Recommendation: Regular;Thin liquids (Level 0) Liquid Administration via: Cup;Straw Medication Administration: Whole meds with puree Supervision: Patient able to self-feed Swallowing strategies  : Small bites/sips;Chin tuck Oral care recommendations: Oral care BID (2x/day)    Shameika Speelman L. Beatris Lincoln, MA CCC/SLP Clinical Specialist - Acute Care SLP Acute Rehabilitation Services Office number [phone]   Myna Asal Laurice 12/17/2023,2:46 PM

## 2023-12-24 DIAGNOSIS — Z79899 Other long term (current) drug therapy: Secondary | ICD-10-CM | POA: Diagnosis not present

## 2023-12-24 DIAGNOSIS — I5020 Unspecified systolic (congestive) heart failure: Secondary | ICD-10-CM | POA: Diagnosis not present

## 2023-12-25 DIAGNOSIS — Z79899 Other long term (current) drug therapy: Secondary | ICD-10-CM | POA: Diagnosis not present

## 2023-12-25 DIAGNOSIS — R059 Cough, unspecified: Secondary | ICD-10-CM | POA: Diagnosis not present

## 2023-12-25 DIAGNOSIS — L24A9 Irritant contact dermatitis due friction or contact with other specified body fluids: Secondary | ICD-10-CM | POA: Diagnosis not present

## 2023-12-26 ENCOUNTER — Emergency Department (HOSPITAL_COMMUNITY)

## 2023-12-26 ENCOUNTER — Inpatient Hospital Stay (HOSPITAL_COMMUNITY)
Admission: EM | Admit: 2023-12-26 | Discharge: 2024-01-19 | DRG: 871 | Disposition: E | Attending: Internal Medicine | Admitting: Internal Medicine

## 2023-12-26 ENCOUNTER — Other Ambulatory Visit: Payer: Self-pay

## 2023-12-26 ENCOUNTER — Encounter (HOSPITAL_COMMUNITY): Payer: Self-pay

## 2023-12-26 DIAGNOSIS — R0989 Other specified symptoms and signs involving the circulatory and respiratory systems: Secondary | ICD-10-CM | POA: Diagnosis not present

## 2023-12-26 DIAGNOSIS — S62525A Nondisplaced fracture of distal phalanx of left thumb, initial encounter for closed fracture: Secondary | ICD-10-CM | POA: Diagnosis not present

## 2023-12-26 DIAGNOSIS — R000 Tachycardia, unspecified: Secondary | ICD-10-CM | POA: Diagnosis not present

## 2023-12-26 DIAGNOSIS — I5032 Chronic diastolic (congestive) heart failure: Secondary | ICD-10-CM | POA: Diagnosis not present

## 2023-12-26 DIAGNOSIS — Z881 Allergy status to other antibiotic agents status: Secondary | ICD-10-CM

## 2023-12-26 DIAGNOSIS — E1122 Type 2 diabetes mellitus with diabetic chronic kidney disease: Secondary | ICD-10-CM | POA: Diagnosis present

## 2023-12-26 DIAGNOSIS — M85842 Other specified disorders of bone density and structure, left hand: Secondary | ICD-10-CM | POA: Diagnosis not present

## 2023-12-26 DIAGNOSIS — G9341 Metabolic encephalopathy: Secondary | ICD-10-CM | POA: Diagnosis present

## 2023-12-26 DIAGNOSIS — S62639A Displaced fracture of distal phalanx of unspecified finger, initial encounter for closed fracture: Secondary | ICD-10-CM | POA: Diagnosis present

## 2023-12-26 DIAGNOSIS — Z66 Do not resuscitate: Secondary | ICD-10-CM | POA: Diagnosis present

## 2023-12-26 DIAGNOSIS — K8020 Calculus of gallbladder without cholecystitis without obstruction: Secondary | ICD-10-CM | POA: Diagnosis present

## 2023-12-26 DIAGNOSIS — S62605A Fracture of unspecified phalanx of left ring finger, initial encounter for closed fracture: Secondary | ICD-10-CM | POA: Diagnosis not present

## 2023-12-26 DIAGNOSIS — I480 Paroxysmal atrial fibrillation: Secondary | ICD-10-CM | POA: Diagnosis present

## 2023-12-26 DIAGNOSIS — I252 Old myocardial infarction: Secondary | ICD-10-CM

## 2023-12-26 DIAGNOSIS — Z808 Family history of malignant neoplasm of other organs or systems: Secondary | ICD-10-CM

## 2023-12-26 DIAGNOSIS — N281 Cyst of kidney, acquired: Secondary | ICD-10-CM | POA: Diagnosis not present

## 2023-12-26 DIAGNOSIS — E785 Hyperlipidemia, unspecified: Secondary | ICD-10-CM | POA: Diagnosis present

## 2023-12-26 DIAGNOSIS — L89153 Pressure ulcer of sacral region, stage 3: Secondary | ICD-10-CM | POA: Diagnosis present

## 2023-12-26 DIAGNOSIS — R0681 Apnea, not elsewhere classified: Secondary | ICD-10-CM | POA: Diagnosis not present

## 2023-12-26 DIAGNOSIS — M85832 Other specified disorders of bone density and structure, left forearm: Secondary | ICD-10-CM | POA: Diagnosis not present

## 2023-12-26 DIAGNOSIS — E860 Dehydration: Secondary | ICD-10-CM | POA: Diagnosis not present

## 2023-12-26 DIAGNOSIS — R404 Transient alteration of awareness: Secondary | ICD-10-CM | POA: Diagnosis not present

## 2023-12-26 DIAGNOSIS — I5033 Acute on chronic diastolic (congestive) heart failure: Secondary | ICD-10-CM | POA: Diagnosis present

## 2023-12-26 DIAGNOSIS — I739 Peripheral vascular disease, unspecified: Secondary | ICD-10-CM | POA: Diagnosis present

## 2023-12-26 DIAGNOSIS — I4891 Unspecified atrial fibrillation: Secondary | ICD-10-CM | POA: Diagnosis present

## 2023-12-26 DIAGNOSIS — E7800 Pure hypercholesterolemia, unspecified: Secondary | ICD-10-CM | POA: Diagnosis not present

## 2023-12-26 DIAGNOSIS — E876 Hypokalemia: Secondary | ICD-10-CM | POA: Diagnosis present

## 2023-12-26 DIAGNOSIS — R918 Other nonspecific abnormal finding of lung field: Secondary | ICD-10-CM | POA: Diagnosis not present

## 2023-12-26 DIAGNOSIS — I951 Orthostatic hypotension: Secondary | ICD-10-CM | POA: Diagnosis present

## 2023-12-26 DIAGNOSIS — R296 Repeated falls: Secondary | ICD-10-CM | POA: Diagnosis present

## 2023-12-26 DIAGNOSIS — Z515 Encounter for palliative care: Secondary | ICD-10-CM

## 2023-12-26 DIAGNOSIS — A419 Sepsis, unspecified organism: Principal | ICD-10-CM | POA: Diagnosis present

## 2023-12-26 DIAGNOSIS — J690 Pneumonitis due to inhalation of food and vomit: Secondary | ICD-10-CM | POA: Diagnosis present

## 2023-12-26 DIAGNOSIS — M25511 Pain in right shoulder: Secondary | ICD-10-CM | POA: Diagnosis present

## 2023-12-26 DIAGNOSIS — I9589 Other hypotension: Secondary | ICD-10-CM | POA: Diagnosis present

## 2023-12-26 DIAGNOSIS — K828 Other specified diseases of gallbladder: Secondary | ICD-10-CM | POA: Diagnosis present

## 2023-12-26 DIAGNOSIS — R627 Adult failure to thrive: Secondary | ICD-10-CM | POA: Diagnosis present

## 2023-12-26 DIAGNOSIS — R0902 Hypoxemia: Secondary | ICD-10-CM | POA: Diagnosis not present

## 2023-12-26 DIAGNOSIS — E1151 Type 2 diabetes mellitus with diabetic peripheral angiopathy without gangrene: Secondary | ICD-10-CM | POA: Diagnosis present

## 2023-12-26 DIAGNOSIS — Z79899 Other long term (current) drug therapy: Secondary | ICD-10-CM

## 2023-12-26 DIAGNOSIS — R1310 Dysphagia, unspecified: Secondary | ICD-10-CM | POA: Diagnosis present

## 2023-12-26 DIAGNOSIS — S51812A Laceration without foreign body of left forearm, initial encounter: Secondary | ICD-10-CM | POA: Diagnosis present

## 2023-12-26 DIAGNOSIS — Z823 Family history of stroke: Secondary | ICD-10-CM

## 2023-12-26 DIAGNOSIS — R0609 Other forms of dyspnea: Secondary | ICD-10-CM | POA: Diagnosis present

## 2023-12-26 DIAGNOSIS — R6510 Systemic inflammatory response syndrome (SIRS) of non-infectious origin without acute organ dysfunction: Secondary | ICD-10-CM | POA: Diagnosis not present

## 2023-12-26 DIAGNOSIS — R109 Unspecified abdominal pain: Secondary | ICD-10-CM | POA: Diagnosis not present

## 2023-12-26 DIAGNOSIS — Z6831 Body mass index (BMI) 31.0-31.9, adult: Secondary | ICD-10-CM

## 2023-12-26 DIAGNOSIS — Z87891 Personal history of nicotine dependence: Secondary | ICD-10-CM

## 2023-12-26 DIAGNOSIS — J189 Pneumonia, unspecified organism: Secondary | ICD-10-CM | POA: Diagnosis present

## 2023-12-26 DIAGNOSIS — N1831 Chronic kidney disease, stage 3a: Secondary | ICD-10-CM | POA: Diagnosis present

## 2023-12-26 DIAGNOSIS — J90 Pleural effusion, not elsewhere classified: Secondary | ICD-10-CM | POA: Diagnosis not present

## 2023-12-26 DIAGNOSIS — R6520 Severe sepsis without septic shock: Principal | ICD-10-CM

## 2023-12-26 DIAGNOSIS — I482 Chronic atrial fibrillation, unspecified: Secondary | ICD-10-CM | POA: Diagnosis present

## 2023-12-26 DIAGNOSIS — M1909 Primary osteoarthritis, other specified site: Secondary | ICD-10-CM | POA: Diagnosis present

## 2023-12-26 DIAGNOSIS — I10 Essential (primary) hypertension: Secondary | ICD-10-CM | POA: Diagnosis present

## 2023-12-26 DIAGNOSIS — E119 Type 2 diabetes mellitus without complications: Secondary | ICD-10-CM

## 2023-12-26 DIAGNOSIS — I2510 Atherosclerotic heart disease of native coronary artery without angina pectoris: Secondary | ICD-10-CM | POA: Diagnosis present

## 2023-12-26 DIAGNOSIS — Z1152 Encounter for screening for COVID-19: Secondary | ICD-10-CM

## 2023-12-26 DIAGNOSIS — E1159 Type 2 diabetes mellitus with other circulatory complications: Secondary | ICD-10-CM | POA: Diagnosis present

## 2023-12-26 DIAGNOSIS — E872 Acidosis, unspecified: Secondary | ICD-10-CM | POA: Diagnosis not present

## 2023-12-26 DIAGNOSIS — I700 Atherosclerosis of aorta: Secondary | ICD-10-CM | POA: Diagnosis not present

## 2023-12-26 DIAGNOSIS — S62522A Displaced fracture of distal phalanx of left thumb, initial encounter for closed fracture: Secondary | ICD-10-CM | POA: Diagnosis present

## 2023-12-26 DIAGNOSIS — F0390 Unspecified dementia without behavioral disturbance: Secondary | ICD-10-CM | POA: Diagnosis present

## 2023-12-26 DIAGNOSIS — W2201XA Walked into wall, initial encounter: Secondary | ICD-10-CM | POA: Diagnosis present

## 2023-12-26 DIAGNOSIS — Z7901 Long term (current) use of anticoagulants: Secondary | ICD-10-CM

## 2023-12-26 DIAGNOSIS — I130 Hypertensive heart and chronic kidney disease with heart failure and stage 1 through stage 4 chronic kidney disease, or unspecified chronic kidney disease: Secondary | ICD-10-CM | POA: Diagnosis present

## 2023-12-26 DIAGNOSIS — S62509A Fracture of unspecified phalanx of unspecified thumb, initial encounter for closed fracture: Secondary | ICD-10-CM | POA: Diagnosis present

## 2023-12-26 DIAGNOSIS — Z950 Presence of cardiac pacemaker: Secondary | ICD-10-CM

## 2023-12-26 DIAGNOSIS — R4182 Altered mental status, unspecified: Secondary | ICD-10-CM | POA: Diagnosis not present

## 2023-12-26 DIAGNOSIS — Z8249 Family history of ischemic heart disease and other diseases of the circulatory system: Secondary | ICD-10-CM

## 2023-12-26 DIAGNOSIS — S62635A Displaced fracture of distal phalanx of left ring finger, initial encounter for closed fracture: Secondary | ICD-10-CM | POA: Diagnosis present

## 2023-12-26 LAB — CBC WITH DIFFERENTIAL/PLATELET
Abs Immature Granulocytes: 0.02 10*3/uL (ref 0.00–0.07)
Basophils Absolute: 0 10*3/uL (ref 0.0–0.1)
Basophils Relative: 0 %
Eosinophils Absolute: 0 10*3/uL (ref 0.0–0.5)
Eosinophils Relative: 0 %
HCT: 34.4 % — ABNORMAL LOW (ref 39.0–52.0)
Hemoglobin: 11.2 g/dL — ABNORMAL LOW (ref 13.0–17.0)
Immature Granulocytes: 0 %
Lymphocytes Relative: 11 %
Lymphs Abs: 1.2 10*3/uL (ref 0.7–4.0)
MCH: 32.7 pg (ref 26.0–34.0)
MCHC: 32.6 g/dL (ref 30.0–36.0)
MCV: 100.3 fL — ABNORMAL HIGH (ref 80.0–100.0)
Monocytes Absolute: 1.4 10*3/uL — ABNORMAL HIGH (ref 0.1–1.0)
Monocytes Relative: 12 %
Neutro Abs: 8.3 10*3/uL — ABNORMAL HIGH (ref 1.7–7.7)
Neutrophils Relative %: 77 %
Platelets: 141 10*3/uL — ABNORMAL LOW (ref 150–400)
RBC: 3.43 MIL/uL — ABNORMAL LOW (ref 4.22–5.81)
RDW: 14.4 % (ref 11.5–15.5)
WBC: 10.9 10*3/uL — ABNORMAL HIGH (ref 4.0–10.5)
nRBC: 0 % (ref 0.0–0.2)

## 2023-12-26 LAB — URINALYSIS, W/ REFLEX TO CULTURE (INFECTION SUSPECTED)
Bacteria, UA: NONE SEEN
Bilirubin Urine: NEGATIVE
Glucose, UA: NEGATIVE mg/dL
Hgb urine dipstick: NEGATIVE
Ketones, ur: NEGATIVE mg/dL
Leukocytes,Ua: NEGATIVE
Nitrite: NEGATIVE
Protein, ur: 30 mg/dL — AB
Specific Gravity, Urine: 1.02 (ref 1.005–1.030)
pH: 5 (ref 5.0–8.0)

## 2023-12-26 LAB — CUP PACEART REMOTE DEVICE CHECK
Battery Remaining Longevity: 61 mo
Battery Voltage: 2.96 V
Brady Statistic RA Percent Paced: 0 %
Brady Statistic RV Percent Paced: 26.04 %
Date Time Interrogation Session: 20250507225137
Implantable Lead Connection Status: 753985
Implantable Lead Connection Status: 753985
Implantable Lead Implant Date: 20190216
Implantable Lead Implant Date: 20190216
Implantable Lead Location: 753859
Implantable Lead Location: 753860
Implantable Lead Model: 5076
Implantable Lead Model: 5076
Implantable Pulse Generator Implant Date: 20190216
Lead Channel Impedance Value: 285 Ohm
Lead Channel Impedance Value: 285 Ohm
Lead Channel Impedance Value: 361 Ohm
Lead Channel Impedance Value: 418 Ohm
Lead Channel Pacing Threshold Amplitude: 0.5 V
Lead Channel Pacing Threshold Amplitude: 0.875 V
Lead Channel Pacing Threshold Pulse Width: 0.4 ms
Lead Channel Pacing Threshold Pulse Width: 0.4 ms
Lead Channel Sensing Intrinsic Amplitude: 3 mV
Lead Channel Sensing Intrinsic Amplitude: 3 mV
Lead Channel Sensing Intrinsic Amplitude: 3.375 mV
Lead Channel Sensing Intrinsic Amplitude: 3.375 mV
Lead Channel Setting Pacing Amplitude: 2 V
Lead Channel Setting Pacing Pulse Width: 0.4 ms
Lead Channel Setting Sensing Sensitivity: 1.2 mV
Zone Setting Status: 755011
Zone Setting Status: 755011

## 2023-12-26 LAB — COMPREHENSIVE METABOLIC PANEL WITH GFR
ALT: 10 U/L (ref 0–44)
AST: 19 U/L (ref 15–41)
Albumin: 3 g/dL — ABNORMAL LOW (ref 3.5–5.0)
Alkaline Phosphatase: 75 U/L (ref 38–126)
Anion gap: 11 (ref 5–15)
BUN: 10 mg/dL (ref 8–23)
CO2: 25 mmol/L (ref 22–32)
Calcium: 8.9 mg/dL (ref 8.9–10.3)
Chloride: 100 mmol/L (ref 98–111)
Creatinine, Ser: 0.71 mg/dL (ref 0.61–1.24)
GFR, Estimated: >60 mL/min (ref >60)
Glucose, Bld: 139 mg/dL — ABNORMAL HIGH (ref 70–99)
Potassium: 3.1 mmol/L — ABNORMAL LOW (ref 3.5–5.1)
Sodium: 136 mmol/L (ref 135–145)
Total Bilirubin: 5.1 mg/dL — ABNORMAL HIGH (ref 0.0–1.2)
Total Protein: 6.3 g/dL — ABNORMAL LOW (ref 6.5–8.1)

## 2023-12-26 LAB — RESP PANEL BY RT-PCR (RSV, FLU A&B, COVID)  RVPGX2
Influenza A by PCR: NEGATIVE
Influenza B by PCR: NEGATIVE
Resp Syncytial Virus by PCR: NEGATIVE
SARS Coronavirus 2 by RT PCR: NEGATIVE

## 2023-12-26 LAB — I-STAT CG4 LACTIC ACID, ED
Lactic Acid, Venous: 2.4 mmol/L (ref 0.5–1.9)
Lactic Acid, Venous: 3.5 mmol/L (ref 0.5–1.9)

## 2023-12-26 LAB — LACTIC ACID, PLASMA: Lactic Acid, Venous: 1.3 mmol/L (ref 0.5–1.9)

## 2023-12-26 LAB — PROTIME-INR
INR: 2.2 — ABNORMAL HIGH (ref 0.8–1.2)
Prothrombin Time: 24.6 s — ABNORMAL HIGH (ref 11.4–15.2)

## 2023-12-26 LAB — MAGNESIUM: Magnesium: 1.8 mg/dL (ref 1.7–2.4)

## 2023-12-26 MED ORDER — SODIUM CHLORIDE 0.9% FLUSH
3.0000 mL | Freq: Two times a day (BID) | INTRAVENOUS | Status: DC
  Administered 2023-12-26 – 2024-01-03 (×15): 3 mL via INTRAVENOUS

## 2023-12-26 MED ORDER — KETOROLAC TROMETHAMINE 15 MG/ML IJ SOLN
15.0000 mg | Freq: Four times a day (QID) | INTRAMUSCULAR | Status: AC | PRN
  Administered 2023-12-26 – 2023-12-30 (×4): 15 mg via INTRAVENOUS
  Filled 2023-12-26 (×5): qty 1

## 2023-12-26 MED ORDER — SODIUM CHLORIDE 0.9 % IV SOLN: INTRAVENOUS | Status: DC

## 2023-12-26 MED ORDER — LIDOCAINE 5 % EX PTCH
1.0000 | MEDICATED_PATCH | CUTANEOUS | Status: DC
  Administered 2023-12-26 – 2024-01-03 (×8): 1 via TRANSDERMAL
  Filled 2023-12-26 (×9): qty 1

## 2023-12-26 MED ORDER — ONDANSETRON HCL 4 MG/2ML IJ SOLN: 4.0000 mg | Freq: Four times a day (QID) | INTRAMUSCULAR | Status: DC | PRN

## 2023-12-26 MED ORDER — POTASSIUM CHLORIDE CRYS ER 10 MEQ PO TBCR
40.0000 meq | EXTENDED_RELEASE_TABLET | Freq: Once | ORAL | Status: AC
  Administered 2023-12-26: 40 meq via ORAL
  Filled 2023-12-26: qty 4

## 2023-12-26 MED ORDER — IOHEXOL 350 MG/ML SOLN
75.0000 mL | Freq: Once | INTRAVENOUS | Status: AC | PRN
  Administered 2023-12-26: 75 mL via INTRAVENOUS

## 2023-12-26 MED ORDER — SENNA 8.6 MG PO TABS
1.0000 | ORAL_TABLET | Freq: Two times a day (BID) | ORAL | Status: DC
  Administered 2023-12-26 – 2024-01-01 (×12): 8.6 mg via ORAL
  Filled 2023-12-26 (×13): qty 1

## 2023-12-26 MED ORDER — LACTATED RINGERS IV SOLN: INTRAVENOUS | Status: DC

## 2023-12-26 MED ORDER — POLYETHYLENE GLYCOL 3350 17 G PO PACK: 17.0000 g | PACK | Freq: Every day | ORAL | Status: DC | PRN

## 2023-12-26 MED ORDER — METRONIDAZOLE 500 MG/100ML IV SOLN: 500.0000 mg | Freq: Once | INTRAVENOUS | Status: DC

## 2023-12-26 MED ORDER — MAGNESIUM SULFATE 2 GM/50ML IV SOLN
2.0000 g | Freq: Once | INTRAVENOUS | Status: AC
  Administered 2023-12-26: 2 g via INTRAVENOUS
  Filled 2023-12-26: qty 50

## 2023-12-26 MED ORDER — APIXABAN 5 MG PO TABS
5.0000 mg | ORAL_TABLET | Freq: Two times a day (BID) | ORAL | Status: DC
  Administered 2023-12-26: 5 mg via ORAL
  Filled 2023-12-26 (×2): qty 1

## 2023-12-26 MED ORDER — IBUPROFEN 400 MG PO TABS: 400.0000 mg | ORAL_TABLET | Freq: Four times a day (QID) | ORAL | Status: DC | PRN

## 2023-12-26 MED ORDER — SODIUM CHLORIDE 0.9 % IV SOLN: 2.0000 g | Freq: Once | INTRAVENOUS | Status: DC

## 2023-12-26 MED ORDER — SPIRONOLACTONE 25 MG PO TABS: 25.0000 mg | ORAL_TABLET | Freq: Every day | ORAL | Status: DC

## 2023-12-26 MED ORDER — SORBITOL 70 % SOLN
30.0000 mL | Freq: Every day | Status: DC | PRN
  Filled 2023-12-26: qty 30

## 2023-12-26 MED ORDER — VANCOMYCIN HCL IN DEXTROSE 1-5 GM/200ML-% IV SOLN: 1000.0000 mg | Freq: Once | INTRAVENOUS | Status: DC

## 2023-12-26 MED ORDER — METHOCARBAMOL 1000 MG/10ML IJ SOLN: 500.0000 mg | Freq: Four times a day (QID) | INTRAMUSCULAR | Status: DC | PRN

## 2023-12-26 MED ORDER — LEVALBUTEROL HCL 0.63 MG/3ML IN NEBU: 0.6300 mg | INHALATION_SOLUTION | Freq: Four times a day (QID) | RESPIRATORY_TRACT | Status: DC | PRN

## 2023-12-26 MED ORDER — VANCOMYCIN HCL 1.25 G IV SOLR
1250.0000 mg | INTRAVENOUS | Status: DC
  Filled 2023-12-26: qty 25

## 2023-12-26 MED ORDER — DOCUSATE SODIUM 100 MG PO CAPS
100.0000 mg | ORAL_CAPSULE | ORAL | Status: DC
Start: 2023-12-26 — End: 2023-12-26

## 2023-12-26 MED ORDER — VANCOMYCIN HCL 2000 MG/400ML IV SOLN
2000.0000 mg | Freq: Once | INTRAVENOUS | Status: AC
  Administered 2023-12-26: 2000 mg via INTRAVENOUS
  Filled 2023-12-26: qty 400

## 2023-12-26 MED ORDER — SODIUM CHLORIDE 0.9 % IV SOLN
2.0000 g | Freq: Once | INTRAVENOUS | Status: AC
  Administered 2023-12-26: 2 g via INTRAVENOUS
  Filled 2023-12-26: qty 12.5

## 2023-12-26 MED ORDER — METOPROLOL SUCCINATE ER 25 MG PO TB24: 12.5000 mg | ORAL_TABLET | Freq: Every day | ORAL | Status: DC

## 2023-12-26 MED ORDER — METRONIDAZOLE 500 MG/100ML IV SOLN
500.0000 mg | Freq: Once | INTRAVENOUS | Status: AC
  Administered 2023-12-26: 500 mg via INTRAVENOUS
  Filled 2023-12-26: qty 100

## 2023-12-26 MED ORDER — MIDODRINE HCL 5 MG PO TABS
15.0000 mg | ORAL_TABLET | Freq: Three times a day (TID) | ORAL | Status: DC
  Filled 2023-12-26: qty 3

## 2023-12-26 MED ORDER — LACTATED RINGERS IV BOLUS
1000.0000 mL | Freq: Once | INTRAVENOUS | Status: AC
  Administered 2023-12-26: 1000 mL via INTRAVENOUS

## 2023-12-26 MED ORDER — ONDANSETRON HCL 4 MG PO TABS: 4.0000 mg | ORAL_TABLET | Freq: Four times a day (QID) | ORAL | Status: DC | PRN

## 2023-12-26 MED ORDER — HYDRALAZINE HCL 20 MG/ML IJ SOLN: 5.0000 mg | Freq: Four times a day (QID) | INTRAMUSCULAR | Status: DC | PRN

## 2023-12-26 MED ORDER — IPRATROPIUM BROMIDE 0.06 % NA SOLN
1.0000 | Freq: Two times a day (BID) | NASAL | Status: DC
  Administered 2023-12-26 – 2024-01-03 (×16): 1 via NASAL
  Filled 2023-12-26: qty 15

## 2023-12-26 MED ORDER — SODIUM CHLORIDE 0.9 % IV SOLN
2.0000 g | Freq: Three times a day (TID) | INTRAVENOUS | Status: DC
  Administered 2023-12-27 (×2): 2 g via INTRAVENOUS
  Filled 2023-12-26 (×2): qty 12.5

## 2023-12-26 NOTE — Progress Notes (Signed)
 2025-contacted lab for status of repeat lactic acid due at 1911, according to lab no labs obtained as there are no current orders for repeat lactic level at this time.   2034-upon chart review, repeat lactic acid ordered for 5/9 at 0500, secure chat sent to Dr. Michell Ahumada for clarification, for repeat lactic lab draw per sepsis protocol. Awaiting response.  2100-Dr. Michell Ahumada paged for order for repeat lactic acid. Awaiting response. Care link RN updated on status of lab draw.  2130-Dr. Michell Ahumada paged for order for repeat lactic acid level. Awaiting response.  2135-Phlebotomy notified of new order for lab draw.

## 2023-12-26 NOTE — ED Provider Notes (Signed)
 Patient given in sign out by Verdia Glad, PA-C.  Please review their note for patient HPI, physical exam, workup.  At this time the plan is plan for admission due to sepsis.  X-ray showed possible fracture at the base of the distal phalanx in the thumb and so we will place finger splint.  Second lactic is rising and so we will get third lactic acid.  Patient's received a liter and is on an infusion of LR at this time.  CT scan shows cholelithiasis without concerning features however patient does have bilirubin of 5.1 and so we will get ultrasound.  At this point patient will require admission for further workup as he still appears ill on exam.  Will consult hospitalist.  Consults: Hildy Lowers, MD Hospitalist  I spoke to the hospitalist and they will come down to admit the patient.  .Critical Care  Performed by: Denese Finn, PA-C Authorized by: Denese Finn, PA-C   Critical care provider statement:    Critical care time (minutes):  30   Critical care time was exclusive of:  Separately billable procedures and treating other patients   Critical care was necessary to treat or prevent imminent or life-threatening deterioration of the following conditions:  Sepsis   Critical care was time spent personally by me on the following activities:  Blood draw for specimens, development of treatment plan with patient or surrogate, discussions with consultants, evaluation of patient's response to treatment, examination of patient, obtaining history from patient or surrogate, review of old charts, re-evaluation of patient's condition, pulse oximetry, ordering and review of radiographic studies, ordering and review of laboratory studies and ordering and performing treatments and interventions   I assumed direction of critical care for this patient from another provider in my specialty: yes     Care discussed with: admitting provider       Denese Finn, PA-C 12/26/23 1750    Kommor, Alyse July,  MD 12/27/23 [phone]

## 2023-12-26 NOTE — ED Notes (Signed)
 RN redressed wound on left forearm

## 2023-12-26 NOTE — Sepsis Progress Note (Signed)
 Notified provider and bedside nurse of need to order and draw repeat lactic acid #3 at 1911.

## 2023-12-26 NOTE — ED Provider Notes (Signed)
 [place] EMERGENCY DEPARTMENT AT [facility] Provider Note   CSN: [id number] Arrival date & time: 12/26/23  1415     History  No chief complaint on file.  HPI Dylan [name] is a 89 y.o. male with atrial fibrillation, CHF, type 2 diabetes presenting for altered mental status.  Patient resides at [facility] nursing facility.  Per staff he has been more altered since Monday.  Primarily behavior has been more combative.  He has been punching the wall with his hand.  Patient does know when he was born but is unsure where he has at this time.  EMS mentioned that he was hot to touch and that they noticed A-fib on the monitor as well as tachycardia.  The staff there also reports that he sacral wound. HPI     Home Medications Prior to Admission medications   Medication Sig Start Date End Date Taking? Authorizing Provider  acetaminophen  (TYLENOL ) 500 MG tablet Take 500 mg by mouth daily in the afternoon.   Yes [provider]  apixaban  (ELIQUIS ) 5 MG TABS tablet Take 1 tablet by mouth twice daily 02/08/23  Yes McLean, Dalton S, MD  docusate sodium  (COLACE) 100 MG capsule Take 100 mg by mouth See admin instructions. 1 capsule every week on Wednesday   Yes [provider]  ibuprofen  (ADVIL ) 400 MG tablet Take 1 tablet (400 mg total) by mouth every 6 (six) hours as needed for mild pain (pain score 1-3) (or Fever >/= 101). 11/21/23  Yes Oral Billings, MD  ipratropium (ATROVENT) 0.06 % nasal spray Place 1 spray into both nostrils in the morning and at bedtime.   Yes [provider]  metoprolol  succinate (TOPROL -XL) 25 MG 24 hr tablet Take 0.5 tablets (12.5 mg total) by mouth daily. 11/22/23 12/26/23 Yes Oral Billings, MD  midodrine  (PROAMATINE ) 5 MG tablet Take 15 mg by mouth 3 (three) times daily with meals.   Yes [provider]  rosuvastatin  (CRESTOR ) 10 MG tablet Take 1 tablet by mouth once daily Patient taking differently: Take 10 mg by mouth daily.  02/11/23  Yes Darlis Eisenmenger, MD  spironolactone (ALDACTONE) 25 MG tablet Take 25 mg by mouth daily. 12/25/23  Yes [provider]  furosemide  (LASIX ) 40 MG tablet Take 1 tablet (40 mg total) by mouth daily as needed for fluid or edema. Patient not taking: Reported on 12/26/2023 11/21/23   Oral Billings, MD  lidocaine  (LIDODERM ) 5 % Place 1 patch onto the skin daily. To low back as needed Remove & Discard patch within 12 hours or as directed by MD Patient not taking: Reported on 12/26/2023 11/21/23   Oral Billings, MD  oxyCODONE  (OXY IR/ROXICODONE ) 5 MG immediate release tablet Take 1 tablet (5 mg total) by mouth every 4 (four) hours as needed for moderate pain (pain score 4-6). Patient not taking: Reported on 12/26/2023 11/21/23   Oral Billings, MD      Allergies    Keflex [cephalexin]    Review of Systems   See HPI   Physical Exam   Vitals:   12/26/23 1514 12/26/23 1515  BP:    Pulse:  (!) 131  Resp: (!) 23 (!) 26  Temp: 99.2 F (37.3 C) 99.2 F (37.3 C)  SpO2:  95%    CONSTITUTIONAL:  ill-appearing, NAD NEURO: No seizure of his birth but unsure of the month or where he is, moving all extremities, responsive to painful stimuli.  Eyes closed but does  open spontaneously. EYES:  eyes equal and reactive ENT/NECK:  Supple, no stridor  CARDIO: Irregular rate and rhythm, appears well-perfused  PULM:  No respiratory distress, CTAB GI/GU:  non-distended, soft, non tender MSK/SPINE: Left hand is erythematous, edematous and contracted.  Skin tear on the sacral region with erythema, no fluctuance. No gross deformities, no edema, moves all extremities  SKIN: hot to touch, pale, skin tear noted to the left forearm,    *Additional and/or pertinent findings included in MDM below    ED Results / Procedures / Treatments   Labs (all labs ordered are listed, but only abnormal results are displayed) Labs Reviewed  COMPREHENSIVE METABOLIC PANEL WITH GFR - Abnormal; Notable for the  following components:      Result Value   Potassium 3.1 (*)    Glucose, Bld 139 (*)    Total Protein 6.3 (*)    Albumin 3.0 (*)    Total Bilirubin 5.1 (*)    All other components within normal limits  CBC WITH DIFFERENTIAL/PLATELET - Abnormal; Notable for the following components:   WBC 10.9 (*)    RBC 3.43 (*)    Hemoglobin 11.2 (*)    HCT 34.4 (*)    MCV 100.3 (*)    Platelets 141 (*)    Neutro Abs 8.3 (*)    Monocytes Absolute 1.4 (*)    All other components within normal limits  PROTIME-INR - Abnormal; Notable for the following components:   Prothrombin Time 24.6 (*)    INR 2.2 (*)    All other components within normal limits  I-STAT CG4 LACTIC ACID, ED - Abnormal; Notable for the following components:   Lactic Acid, Venous 2.4 (*)    All other components within normal limits  RESP PANEL BY RT-PCR (RSV, FLU A&B, COVID)  RVPGX2  CULTURE, BLOOD (ROUTINE X 2)  CULTURE, BLOOD (ROUTINE X 2)  URINALYSIS, W/ REFLEX TO CULTURE (INFECTION SUSPECTED)  MAGNESIUM    EKG None  Radiology DG Chest Port 1 View Result Date: 12/26/2023 CLINICAL DATA:  Questionable sepsis, evaluate for abnormality. Altered mental status. EXAM: PORTABLE CHEST 1 VIEW COMPARISON:  Radiographs 06/10/2022 and 08/09/2019.  CT 06/10/2022. FINDINGS: 1446 hours. Two views submitted. Left subclavian pacemaker leads project over the right atrium and right ventricle. The heart size and mediastinal contours are stable with aortic atherosclerosis. Increased vascular congestion with probable patchy atelectasis at both lung bases. No overt pulmonary edema, confluent airspace disease, pneumothorax or significant pleural effusion. No acute osseous findings are seen. There are degenerative changes throughout the spine. IMPRESSION: Increased vascular congestion with probable patchy atelectasis at both lung bases. No definite evidence of pneumonia. Electronically Signed   By: Elmon Hagedorn M.D.   On: 12/26/2023 15:00   CUP  PACEART REMOTE DEVICE CHECK Result Date: 12/26/2023 PPM Scheduled remote reviewed. Normal device function.  Presenting rhythm:  AF/AFL/VS Permanent AF, not always good rate control, subtle increase in HR's per trends, Eliquis  per PA report Next remote 91 days. LA, CVRS   Procedures Procedures    Medications Ordered in ED Medications  lactated ringers  infusion ( Intravenous New Bag/Given 12/26/23 1515)  vancomycin  (VANCOREADY) IVPB 2000 mg/400 mL (2,000 mg Intravenous New Bag/Given 12/26/23 1508)  metroNIDAZOLE  (FLAGYL ) IVPB 500 mg (500 mg Intravenous New Bag/Given 12/26/23 1506)  lactated ringers  bolus 1,000 mL (1,000 mLs Intravenous New Bag/Given 12/26/23 1512)  ceFEPIme (MAXIPIME) 2 g in sodium chloride  0.9 % 100 mL IVPB (2 g Intravenous New Bag/Given 12/26/23 1504)  ED Course/ Medical Decision Making/ A&P                                 Medical Decision Making Amount and/or Complexity of Data Reviewed Labs: ordered. Radiology: ordered.  Risk Prescription drug management.   Initial Impression and Ddx 89 year old ill-appearing male presenting for altered mental status.  Exam notable for skin tears to the left forearm with swelling and erythema to the left hand, sacral erythema with skin tear, irregular tachycardia, pale and hot to touch.  DDx includes sepsis, arrhythmia, stroke, electrolyte derangement, intra-abdominal or cardiothoracic infection, UTI, other. Patient PMH that increases complexity of ED encounter:  atrial fibrillation, CHF, type 2 diabetes   Interpretation of Diagnostics I independent reviewed and interpreted the labs as followed: Lactic acidosis, leukocytosis, hypokalemia  - I independently visualized the following imaging with scope of interpretation limited to determining acute life threatening conditions related to emergency care: Chest x-ray, which revealed increase vascular congestion but no evidence of pneumonia.  Other imaging studies are pending  -I personally  reviewed and interpreted the EKG which revealed atrial fibs with RVR  Patient Reassessment and Ultimate Disposition/Management Initial assessment suspicious for sepsis of unknown etiology.  Did inspect the sacral wound which could be the source but unclear at this time.  Started on broad-spectrum antibiotics.  Also in A-fib with RVR.  Plan for now will be to fluid resuscitate and treat for sepsis.  Signed out to PA [facility].  CT chest abdomen pelvis and CT head pending along with other labs.  Will need admission.  Patient management required discussion with the following services or consulting groups:  None  Complexity of Problems Addressed Acute complicated illness or Injury  Additional Data Reviewed and Analyzed Further history obtained from: Past medical history and medications listed in the EMR and Prior ED visit notes  Patient Encounter Risk Assessment Consideration of hospitalization         Final Clinical Impression(s) / ED Diagnoses Final diagnoses:  Altered mental status, unspecified altered mental status type    Rx / DC Orders ED Discharge Orders     None         Dhriti Fales K, PA-C 12/26/23 1543    Karlyn Overman, MD 12/26/23 [phone]

## 2023-12-26 NOTE — Progress Notes (Signed)
 Pharmacy Antibiotic Note  Dylan [name] is a 89 y.o. male admitted on 12/26/2023 with sepsis.  Pharmacy has been consulted for vancomcyin and cefepime dosing.   Plan: Vancomycin  2000 mg IV x1 given in ED, followed by vancomycin  1250 mg IV Q24h (eAUC ~430, goal AUC 400-550, Scr 0.8) Cefepime 2g IV Q8h Trend WBC, fever, renal function F/u cultures, clinical progress, levels as indicated De-escalate when able    Temp (24hrs), Avg:99.1 F (37.3 C), Min:96.7 F (35.9 C), Max:99.8 F (37.7 C)  Recent Labs  Lab 12/26/23 1432 12/26/23 1438 12/26/23 1711  WBC 10.9*  --   --   CREATININE 0.71  --   --   LATICACIDVEN  --  2.4* 3.5*    CrCl cannot be calculated (Unknown ideal weight.).    Allergies  Allergen Reactions   Keflex [Cephalexin] Hives and Other (See Comments)    Occurred in the 1980's Tolerated ceftriaxone  11/18/23   Microbiology results: 5/08 BCx: pending  Thank you for allowing pharmacy to be a part of this patient's care.  Claudia Cuff, PharmD, BCPS Clinical Pharmacist

## 2023-12-26 NOTE — H&P (Signed)
 History and Physical    Dylan [name] YQM:[id number] DOB: [birth year] DOA: 12/26/2023  PCP: Tisovec, Richard W, MD Patient coming from: [facility]   I have personally briefly reviewed patient's old medical records in [facility]  Chief Complaint: Confusion/altered mental status  HPI: Dylan [name] is a 89 y.o. male with medical history significant of A-fib on chronic anticoagulation with Eliquis , orthostatic hypotension on midodrine , diabetes mellitus, hypertension, CHF, tachybradycardia syndrome with junctional bradycardia, status post [employer] dual-chamber PPM, chronic diastolic CHF, CKD stage IIIa with baseline creatinine approximately 1, hyperlipidemia presented to the ED with confusion/altered mental status.  Patient noted to have been recently hospitalized 11/18/2023-11/21/2023 for frequent falls, UTI, dehydration.  Information obtained from ED physician's note as well as per family who are at bedside. It is noted per ED physician's note that patient noted to have been altered for the past 3 days prior to admission with agitation and combative behavior punching the wall with his hand and worsening confusion from baseline.  It was noted per ED physician's note that EMS mention patient was hot to the touch and patient noted to be in A-fib on the monitor and brought to the ED.  Per son and daughter who are at bedside 3 days prior to admission patient noted to have some worsening confusion and combativeness.  Per son he was notified that MD had ordered some Ativan for patient's agitation as patient was swinging and punching the wall.  Patient was noted to be still confused on Tuesday with some complaints of left-sided arm and hand pain as well as left-sided pain.  Patient noted to have also received some narcotics for pain management in the interim.  Per son he felt patient's altered mental status was not improving 1 day prior to admission and further narcotics may have been playing a role.  Son felt  patient was also lethargic, unable to feed himself, unable to stay awake with some nonsensical speech.  Patient started states there was concern for aspiration and patient's liquids were thickened at the facility.  Patient's son denies any complaints of fevers, no chills, no nausea, no chest pain, no shortness of breath, no vomiting, no abdominal pain, no diarrhea, no constipation, no melena, no hematemesis, no hematochezia, no syncopal episodes.  Patient noted with generalized weakness.  ED Course: Patient brought into the ED, patient noted to have a lactic acidosis with a lactic acid level of 2.4 with repeat at 3.5, patient noted to also be tachycardic with heart rates as high as 159, noted initially to be tachypneic with a respiratory rate of 40, blood pressure as high as 162/114.  Patient noted with sats of 96% on 4 to 5 L nasal cannula.  Comprehensive metabolic profile with a potassium of 3.1, glucose of 139, albumin of 3.0, protein of 6.3, bilirubin of 5.1 otherwise within normal limits.  CBC with a white count of 10.9, hemoglobin of 11.2, platelet count of 141 otherwise within normal limits.  INR noted at 2.2.  Urinalysis nitrite negative, leukocytes negative, no bacteria seen, 0-5 WBCs.  Chest x-ray done with increased vascular congestion with probable patchy atelectasis at both lung bases.  No definite evidence of pneumonia.  Plain films of the left elbow, left hand done with angulated fracture of the proximal phalanx of the fourth digit, possible nondisplaced fracture of the base of the distal phalanx of the thumb.  Evaluation of hand limited due to suboptimal positioning.  Right upper quadrant ultrasound done with distended gallbladder with  a stone.  No sonographic evidence of acute cholecystitis at this time.  No ductal dilatation.  Right-sided pleural effusion.  EKG done consistent with A-fib with heart rate of 134.  CT head with atrophy, chronic microvascular disease.  No acute intracranial  abnormality.  CT abdomen and pelvis with cardiomegaly, coronary artery disease, aortic atherosclerosis.  Small bilateral effusions with compressive atelectasis in the lower lobes.  Cholelithiasis.  No evidence of acute cholecystitis.  No acute findings in the abdomen or pelvis.  Review of Systems: As per HPI otherwise all other systems reviewed and are negative.  Past Medical History:  Diagnosis Date   COVID-19    Diabetes mellitus without complication (HCC)    Dyspnea    History of hiatal hernia    Hyperglycemia    Hyperlipidemia    Hypertension    Lung nodule    Myocardial infarction [facility])    Presence of permanent cardiac pacemaker     Past Surgical History:  Procedure Laterality Date   APPENDECTOMY  1956   BACK SURGERY  2008   CARDIAC CATHETERIZATION  1995   negative results   CARDIOVERSION N/A 08/05/2017   Procedure: CARDIOVERSION;  Surgeon: Darlis Eisenmenger, MD;  Location: [facility] ENDOSCOPY;  Service: Cardiovascular;  Laterality: N/A;   CARDIOVERSION N/A 09/12/2017   Procedure: CARDIOVERSION;  Surgeon: Darlis Eisenmenger, MD;  Location: [facility] ENDOSCOPY;  Service: Cardiovascular;  Laterality: N/A;   CARDIOVERSION N/A 09/15/2021   Procedure: CARDIOVERSION;  Surgeon: Darlis Eisenmenger, MD;  Location: [facility] ENDOSCOPY;  Service: Cardiovascular;  Laterality: N/A;   CARDIOVERSION N/A 10/12/2021   Procedure: CARDIOVERSION;  Surgeon: Darlis Eisenmenger, MD;  Location: [facility] ENDOSCOPY;  Service: Cardiovascular;  Laterality: N/A;   COLON SURGERY  07/09/12   HERNIA REPAIR  1952   HOT HEMOSTASIS  01/08/2012   Procedure: HOT HEMOSTASIS (ARGON PLASMA COAGULATION/BICAP);  Surgeon: Evangeline Hilts, MD;  Location: [facility] ENDOSCOPY;  Service: Endoscopy;  Laterality: N/A;   LAPAROSCOPIC ILEOCECECTOMY  07/09/2012   Procedure: LAPAROSCOPIC ILEOCECECTOMY;  Surgeon: Quitman Bucy, MD;  Location: [facility] ORS;  Service: General;  Laterality: N/A;  Laparoscopic Ileocecectomy   PACEMAKER IMPLANT N/A 10/05/2017   Procedure:  PACEMAKER IMPLANT;  Surgeon: Verona Goodwill, MD;  Location: [facility] INVASIVE CV LAB;  Service: Cardiovascular;  Laterality: N/A;   REPLACEMENT TOTAL KNEE  2010   TEE WITHOUT CARDIOVERSION N/A 08/05/2017   Procedure: TRANSESOPHAGEAL ECHOCARDIOGRAM (TEE);  Surgeon: Darlis Eisenmenger, MD;  Location: [facility] ENDOSCOPY;  Service: Cardiovascular;  Laterality: N/A;   TEE WITHOUT CARDIOVERSION N/A 09/12/2017   Procedure: TRANSESOPHAGEAL ECHOCARDIOGRAM (TEE);  Surgeon: Darlis Eisenmenger, MD;  Location: [facility] ENDOSCOPY;  Service: Cardiovascular;  Laterality: N/A;    Social History  reports that he quit smoking about 31 years ago. His smoking use included cigarettes. He has never used smokeless tobacco. He reports that he does not currently use alcohol  after a past usage of about 2.0 standard drinks of alcohol  per week. He reports that he does not use drugs.  Allergies  Allergen Reactions   Keflex [Cephalexin] Hives and Other (See Comments)    Occurred in the 1980's Tolerated ceftriaxone  11/18/23    Family History  Problem Relation Age of Onset   Cancer - Other Mother    CVA Father    Heart attack Brother    Heart attack Maternal Grandmother    Mother deceased age 4 from cancer of the palate.  Father with history of alcohol  abuse.   Prior to Admission medications  Medication Sig Start Date End Date Taking? Authorizing Provider  acetaminophen  (TYLENOL ) 500 MG tablet Take 500 mg by mouth daily in the afternoon.   Yes [provider]  apixaban  (ELIQUIS ) 5 MG TABS tablet Take 1 tablet by mouth twice daily 02/08/23  Yes McLean, Dalton S, MD  docusate sodium  (COLACE) 100 MG capsule Take 100 mg by mouth See admin instructions. 1 capsule every week on Wednesday   Yes [provider]  ibuprofen  (ADVIL ) 400 MG tablet Take 1 tablet (400 mg total) by mouth every 6 (six) hours as needed for mild pain (pain score 1-3) (or Fever >/= 101). 11/21/23  Yes Oral Billings, MD  ipratropium (ATROVENT) 0.06 % nasal  spray Place 1 spray into both nostrils in the morning and at bedtime.   Yes [provider]  metoprolol  succinate (TOPROL -XL) 25 MG 24 hr tablet Take 0.5 tablets (12.5 mg total) by mouth daily. 11/22/23 12/26/23 Yes Oral Billings, MD  midodrine  (PROAMATINE ) 5 MG tablet Take 15 mg by mouth 3 (three) times daily with meals.   Yes [provider]  rosuvastatin  (CRESTOR ) 10 MG tablet Take 1 tablet by mouth once daily Patient taking differently: Take 10 mg by mouth daily. 02/11/23  Yes Darlis Eisenmenger, MD  spironolactone (ALDACTONE) 25 MG tablet Take 25 mg by mouth daily. 12/25/23  Yes [provider]  furosemide  (LASIX ) 40 MG tablet Take 1 tablet (40 mg total) by mouth daily as needed for fluid or edema. Patient not taking: Reported on 12/26/2023 11/21/23   Oral Billings, MD  lidocaine  (LIDODERM ) 5 % Place 1 patch onto the skin daily. To low back as needed Remove & Discard patch within 12 hours or as directed by MD Patient not taking: Reported on 12/26/2023 11/21/23   Oral Billings, MD  oxyCODONE  (OXY IR/ROXICODONE ) 5 MG immediate release tablet Take 1 tablet (5 mg total) by mouth every 4 (four) hours as needed for moderate pain (pain score 4-6). Patient not taking: Reported on 12/26/2023 11/21/23   Oral Billings, MD    Physical Exam: Vitals:   12/26/23 1745 12/26/23 1800 12/26/23 1827 12/26/23 1828  BP:      Pulse: 85 (!) 106 (!) 49 (!) 120  Resp: 13 12 (!) 23 (!) 21  Temp: 99.3 F (37.4 C) 99.4 F (37.4 C) 99.4 F (37.4 C) 99.4 F (37.4 C)  TempSrc:      SpO2: 96% 100% 94% 95%    Constitutional: NAD, calm, comfortable Vitals:   12/26/23 1745 12/26/23 1800 12/26/23 1827 12/26/23 1828  BP:      Pulse: 85 (!) 106 (!) 49 (!) 120  Resp: 13 12 (!) 23 (!) 21  Temp: 99.3 F (37.4 C) 99.4 F (37.4 C) 99.4 F (37.4 C) 99.4 F (37.4 C)  TempSrc:      SpO2: 96% 100% 94% 95%   Eyes: PERRL, lids and conjunctivae normal ENMT: Mucous membranes are extremely dry. Posterior  pharynx clear of any exudate or lesions. Dentures noted.  Neck: normal, supple, no masses, no thyromegaly Respiratory: clear to auscultation bilaterally anterior lung fields, no wheezing, no crackles. Normal respiratory effort. No accessory muscle use.  Cardiovascular: Irregularly irregular.  No murmurs rubs or gallops.  Trace bilateral lower extremity edema.  2+ pedal pulses.  Abdomen: Abdomen is soft, nontender, nondistended, positive bowel sounds.  No rebound.  No guarding.   Musculoskeletal: no clubbing / cyanosis.  Right hand with some ecchymosis, some diffuse tenderness to palpation.  Skin: no rashes, lesions, ulcers. No induration Neurologic: Alert and oriented to self place and time.  CN 2-12 grossly intact.  Moving extremities spontaneously.  Psychiatric: Poor to fair judgment and insight. Alert and oriented x 3. Normal mood.   Labs on Admission: I have personally reviewed following labs and imaging studies  CBC: Recent Labs  Lab 12/26/23 1432  WBC 10.9*  NEUTROABS 8.3*  HGB 11.2*  HCT 34.4*  MCV 100.3*  PLT 141*    Basic Metabolic Panel: Recent Labs  Lab 12/26/23 1432  NA 136  K 3.1*  CL 100  CO2 25  GLUCOSE 139*  BUN 10  CREATININE 0.71  CALCIUM  8.9  MG 1.8    GFR: CrCl cannot be calculated (Unknown ideal weight.).  Liver Function Tests: Recent Labs  Lab 12/26/23 1432  AST 19  ALT 10  ALKPHOS 75  BILITOT 5.1*  PROT 6.3*  ALBUMIN 3.0*    Urine analysis:    Component Value Date/Time   COLORURINE AMBER (A) 12/26/2023 1432   APPEARANCEUR CLEAR 12/26/2023 1432   LABSPEC 1.020 12/26/2023 1432   PHURINE 5.0 12/26/2023 1432   GLUCOSEU NEGATIVE 12/26/2023 1432   HGBUR NEGATIVE 12/26/2023 1432   BILIRUBINUR NEGATIVE 12/26/2023 1432   KETONESUR NEGATIVE 12/26/2023 1432   PROTEINUR 30 (A) 12/26/2023 1432   UROBILINOGEN 1.0 11/22/2009 1423   NITRITE NEGATIVE 12/26/2023 1432   LEUKOCYTESUR NEGATIVE 12/26/2023 1432    Radiological Exams on  Admission: US  Abdomen Limited RUQ (LIVER/GB) Result Date: 12/26/2023 CLINICAL DATA:  Sepsis. EXAM: ULTRASOUND ABDOMEN LIMITED RIGHT UPPER QUADRANT COMPARISON:  CT 12/26/2023. FINDINGS: Gallbladder: Dilated gallbladder. Dependent stone. No wall thickening or adjacent fluid. No reported sonographic Murphy's sign. As per the sonographer the patient was unable to undergo left lateral decubitus imaging at this time due to clinical condition. Common bile duct: Diameter: 4 mm Liver: No focal lesion identified. Within normal limits in parenchymal echogenicity. Portal vein is patent on color Doppler imaging with normal direction of blood flow towards the liver. Other: Right-sided pleural effusion. IMPRESSION: Distended gallbladder with a stone. No further sonographic evidence of acute cholecystitis at this time. No ductal dilatation. Right-sided pleural effusion Electronically Signed   By: Adrianna Horde M.D.   On: 12/26/2023 18:13   CT ABDOMEN PELVIS W CONTRAST Result Date: 12/26/2023 CLINICAL DATA:  Abdominal pain EXAM: CT ABDOMEN AND PELVIS WITH CONTRAST TECHNIQUE: Multidetector CT imaging of the abdomen and pelvis was performed using the standard protocol following bolus administration of intravenous contrast. RADIATION DOSE REDUCTION: This exam was performed according to the departmental dose-optimization program which includes automated exposure control, adjustment of the mA and/or kV according to patient size and/or use of iterative reconstruction technique. CONTRAST:  75mL OMNIPAQUE  IOHEXOL  350 MG/ML SOLN COMPARISON:  06/10/2022 FINDINGS: Lower chest: Cardiomegaly. Pacer wires in the right heart. Coronary artery and aortic atherosclerosis. Small bilateral pleural effusions. Dependent atelectasis in the lower lobes. Hepatobiliary: No focal hepatic abnormality. Small layering gallstone within the gallbladder. No biliary ductal dilatation. Pancreas: No focal abnormality or ductal dilatation. Spleen: No focal  abnormality.  Normal size. Adrenals/Urinary Tract: Bilateral renal cysts are stable and appear benign. No follow-up imaging recommended. No stones or hydronephrosis. Adrenal glands and urinary bladder unremarkable. Urinary bladder decompressed with Foley catheter in place. Stomach/Bowel: Stomach, large and small bowel grossly unremarkable. Vascular/Lymphatic: Aortoiliac atherosclerosis. No evidence of aneurysm or adenopathy. Reproductive: No visible focal abnormality. Other: No free fluid or free air. Musculoskeletal: No acute bony abnormality. IMPRESSION: Cardiomegaly, coronary artery disease.  Aortic atherosclerosis. Small bilateral effusions with compressive atelectasis in the lower lobes. Cholelithiasis.  No evidence for acute cholecystitis. No acute findings in the abdomen or pelvis. Electronically Signed   By: Janeece Mechanic M.D.   On: 12/26/2023 17:17   CT Head Wo Contrast Result Date: 12/26/2023 CLINICAL DATA:  Mental status changes EXAM: CT HEAD WITHOUT CONTRAST TECHNIQUE: Contiguous axial images were obtained from the base of the skull through the vertex without intravenous contrast. RADIATION DOSE REDUCTION: This exam was performed according to the departmental dose-optimization program which includes automated exposure control, adjustment of the mA and/or kV according to patient size and/or use of iterative reconstruction technique. COMPARISON:  None Available. FINDINGS: Brain: There is atrophy and chronic small vessel disease changes. No acute intracranial abnormality. Specifically, no hemorrhage, hydrocephalus, mass lesion, acute infarction, or significant intracranial injury. Vascular: No hyperdense vessel or unexpected calcification. Skull: No acute calvarial abnormality. Sinuses/Orbits: No acute findings Other: None IMPRESSION: Atrophy, chronic microvascular disease. No acute intracranial abnormality. Electronically Signed   By: Janeece Mechanic M.D.   On: 12/26/2023 17:13   DG Hand Complete  Left Result Date: 12/26/2023 CLINICAL DATA:  Trauma to the left upper extremity.  Pain. EXAM: LEFT HAND - COMPLETE 3+ VIEW; LEFT ELBOW - COMPLETE 3+ VIEW; LEFT FOREARM - 2 VIEW COMPARISON:  None Available. FINDINGS: Lucency through the base of the distal phalanx of the thumb suboptimally evaluated due to positioning. This may be chronic however, an acute fracture is not excluded. Correlation with clinical exam recommended. Dedicated radiograph of the thumb may provide better evaluation if there is clinical concern for fracture. There is an angulated fracture of the proximal phalanx of the fourth digit with dorsal angulation of the distal fracture fragment. No other acute fracture. No dislocation. The bones are osteopenic. Soft tissue swelling of the wrist. No radiopaque foreign object or soft tissue gas. IMPRESSION: 1. Angulated fracture of the proximal phalanx of the fourth digit. 2. Possible nondisplaced fracture of the base of the distal phalanx of the thumb. Evaluation of the hand is limited due to suboptimal positioning. Electronically Signed   By: Angus Bark M.D.   On: 12/26/2023 15:52   DG Forearm Left Result Date: 12/26/2023 CLINICAL DATA:  Trauma to the left upper extremity.  Pain. EXAM: LEFT HAND - COMPLETE 3+ VIEW; LEFT ELBOW - COMPLETE 3+ VIEW; LEFT FOREARM - 2 VIEW COMPARISON:  None Available. FINDINGS: Lucency through the base of the distal phalanx of the thumb suboptimally evaluated due to positioning. This may be chronic however, an acute fracture is not excluded. Correlation with clinical exam recommended. Dedicated radiograph of the thumb may provide better evaluation if there is clinical concern for fracture. There is an angulated fracture of the proximal phalanx of the fourth digit with dorsal angulation of the distal fracture fragment. No other acute fracture. No dislocation. The bones are osteopenic. Soft tissue swelling of the wrist. No radiopaque foreign object or soft tissue gas.  IMPRESSION: 1. Angulated fracture of the proximal phalanx of the fourth digit. 2. Possible nondisplaced fracture of the base of the distal phalanx of the thumb. Evaluation of the hand is limited due to suboptimal positioning. Electronically Signed   By: Angus Bark M.D.   On: 12/26/2023 15:52   DG Elbow Complete Left Result Date: 12/26/2023 CLINICAL DATA:  Trauma to the left upper extremity.  Pain. EXAM: LEFT HAND - COMPLETE 3+ VIEW; LEFT ELBOW - COMPLETE 3+ VIEW; LEFT FOREARM - 2 VIEW COMPARISON:  None Available.  FINDINGS: Lucency through the base of the distal phalanx of the thumb suboptimally evaluated due to positioning. This may be chronic however, an acute fracture is not excluded. Correlation with clinical exam recommended. Dedicated radiograph of the thumb may provide better evaluation if there is clinical concern for fracture. There is an angulated fracture of the proximal phalanx of the fourth digit with dorsal angulation of the distal fracture fragment. No other acute fracture. No dislocation. The bones are osteopenic. Soft tissue swelling of the wrist. No radiopaque foreign object or soft tissue gas. IMPRESSION: 1. Angulated fracture of the proximal phalanx of the fourth digit. 2. Possible nondisplaced fracture of the base of the distal phalanx of the thumb. Evaluation of the hand is limited due to suboptimal positioning. Electronically Signed   By: Angus Bark M.D.   On: 12/26/2023 15:52   DG Chest Port 1 View Result Date: 12/26/2023 CLINICAL DATA:  Questionable sepsis, evaluate for abnormality. Altered mental status. EXAM: PORTABLE CHEST 1 VIEW COMPARISON:  Radiographs 06/10/2022 and 08/09/2019.  CT 06/10/2022. FINDINGS: 1446 hours. Two views submitted. Left subclavian pacemaker leads project over the right atrium and right ventricle. The heart size and mediastinal contours are stable with aortic atherosclerosis. Increased vascular congestion with probable patchy atelectasis at both lung  bases. No overt pulmonary edema, confluent airspace disease, pneumothorax or significant pleural effusion. No acute osseous findings are seen. There are degenerative changes throughout the spine. IMPRESSION: Increased vascular congestion with probable patchy atelectasis at both lung bases. No definite evidence of pneumonia. Electronically Signed   By: Elmon Hagedorn M.D.   On: 12/26/2023 15:00   CUP PACEART REMOTE DEVICE CHECK Result Date: 12/26/2023 PPM Scheduled remote reviewed. Normal device function.  Presenting rhythm:  AF/AFL/VS Permanent AF, not always good rate control, subtle increase in HR's per trends, Eliquis  per PA report Next remote 91 days. LA, CVRS   EKG: Independently reviewed.  Atrial fibrillation.  Heart rate 134.  Assessment/Plan Principal Problem:   SIRS (systemic inflammatory response syndrome) (HCC) Active Problems:   Acute metabolic encephalopathy   DOE (dyspnea on exertion)   Benign essential HTN   Type 2 diabetes mellitus without complication (HCC)   Dyslipidemia   Atrial fibrillation (HCC)   Chronic diastolic CHF (congestive heart failure) (HCC)   Type 2 diabetes mellitus with vascular disease (HCC)   Pure hypercholesterolemia   PVD (peripheral vascular disease) (HCC)   Orthostatic intolerance   Hypokalemia   Lactic acidosis   Dehydration   Hyperbilirubinemia   Thumb fracture   Closed fracture of distal phalanx of digit of left hand    #1 systemic inflammatory response syndrome -Patient presented with criteria for systemic inflammatory response syndrome with initial presentation with a tachycardia/A-fib with RVR, tachypnea, lactic acidosis, slight leukocytosis. - Chest x-ray done negative for any acute infiltrate. - Urinalysis unremarkable. - Blood cultures ordered and pending. - Lactic acid level elevated will repeat lactic acid level in the AM. - Patient noted to have received IV fluids, dose of IV vancomycin , IV cefepime, IV Flagyl  in the ED. -  Patient looks clinically dry on examination. - Continue gentle hydration with IV fluids, repeat lactic acid level in the a.m., repeat chest x-ray in the a.m. to see if her pneumonia flops out after hydration. - Place empirically on IV cefepime and IV vancomycin  pending culture results. - Supportive care.  2.  Hypokalemia -Magnesium level noted at 1.8. - Potassium at 3.1. - K-Dur 40 mEq p.o. x 1. - Magnesium sulfate 2 g  IV x 1. - Repeat labs in the AM.  3.  Acute metabolic encephalopathy -??  Etiology. - Concern for SIRS versus secondary to dehydration versus sepsis drug-induced (son noted patient may have had received some Ativan and oxycodone  with no improvement with his symptoms and concern could have contributed. -Patient on my interview is alert oriented to self place and time.  Knows who the president is. - Patient pancultured results pending. - Placed empirically on IV vancomycin , IV cefepime secondary to problem #1. - IV fluids, gentle hydration.  4.  Lactic acidosis -Likely be secondary to SIRS versus dehydration. - Urinalysis unremarkable, chest x-ray negative for any acute infiltrate, blood cultures pending. - Repeat chest x-ray in the morning.  Repeat lactic acid level in the AM. - IV fluids. - Placed empirically on IV antibiotics.  5.  Chronic A-fib -Continue home regimen Toprol -XL for rate control. - Eliquis  for anticoagulation.  6.  Dehydration -IV fluids.  7.??  Dysphagia -Per son patient's diet had been changed with thickened liquids due to concern for possible aspiration at the facility. - Place patient on dysphagia 3 diet with nectar thick liquids. - SLP evaluation.  8.  Chronic hypotension -Continue home regimen midodrine .  9.  Fracture of the base of the thumb/fracture of the fourth digit - Secondary to punching the wall at facility per EDP's report. - Will need to discuss with hand surgeon in the morning for further recommendations. - Pain  management.  10.  Chronic diastolic CHF -Stable. - Patient looks more on the dry side. - Hold diuretics.  11.  Hyperbilirubinemia -CT abdomen and pelvis negative for acute cholecystitis. - Patient asymptomatic with no abdominal pain on examination. - Right upper quadrant ultrasound done with a distended gallbladder with a stone.  No further sonographic evidence of acute cholecystitis at this time.  No ductal dilatation.  Right-sided pleural effusion. - Repeat labs in the AM.  12.  Well-controlled diabetes mellitus type 2 -Hemoglobin A1c 5.9 (11/20/2023) - Diet controlled prior to admission.  DVT prophylaxis: Eliquis  Code Status:   DNR Family Communication:  Updated son and daughter at bedside. Disposition Plan:   Patient is from:  [facility] facility.  Anticipated DC to:  TBD  Anticipated DC date:  3 to 4 days  Anticipated DC barriers: Clinical improvement Consults called:  None Admission status:  Place in observation/medical telemetry  Severity of Illness: The appropriate patient status for this patient is OBSERVATION. Observation status is judged to be reasonable and necessary in order to provide the required intensity of service to ensure the patient's safety. The patient's presenting symptoms, physical exam findings, and initial radiographic and laboratory data in the context of their medical condition is felt to place them at decreased risk for further clinical deterioration. Furthermore, it is anticipated that the patient will be medically stable for discharge from the hospital within 2 midnights of admission.     Hilda Lovings MD [facility]  How to contact the TRH Attending or Consulting provider 7A - 7P or covering provider during after hours 7P -7A, for this patient?   Check the care team in [facility] and look for a) attending/consulting TRH provider listed and b) the TRH team listed Log into [email or web address] and use [facility] universal password to access. If you do not  have the password, please contact the hospital operator. Locate the TRH provider you are looking for under [facility] and page to a number that you can be directly reached. If you still have  difficulty reaching the provider, please page the [facility] (Director on Call) for the Hospitalists listed on amion for assistance.  12/26/2023, 7:11 PM

## 2023-12-26 NOTE — Sepsis Progress Note (Signed)
 Notified bedside nurse of need to draw repeat lactic acid.

## 2023-12-26 NOTE — ED Triage Notes (Signed)
 Pt bib ems form [facility] facility; staff reports ams since Monday; a and o x 2 baseline; hot to touch; afib on monitor, hx of same per family; capnography 28; hr 114; sats 95% 4L (2L baseline); ems reports periods of hyperventilation and apnea; staff reports sacral wound; prone to utis

## 2023-12-26 NOTE — Sepsis Progress Note (Signed)
 Elink monitoring for the code sepsis protocol.

## 2023-12-27 ENCOUNTER — Inpatient Hospital Stay (HOSPITAL_COMMUNITY)

## 2023-12-27 ENCOUNTER — Observation Stay (HOSPITAL_COMMUNITY)

## 2023-12-27 DIAGNOSIS — I5033 Acute on chronic diastolic (congestive) heart failure: Secondary | ICD-10-CM | POA: Diagnosis not present

## 2023-12-27 DIAGNOSIS — Z515 Encounter for palliative care: Secondary | ICD-10-CM | POA: Diagnosis not present

## 2023-12-27 DIAGNOSIS — M47812 Spondylosis without myelopathy or radiculopathy, cervical region: Secondary | ICD-10-CM | POA: Diagnosis not present

## 2023-12-27 DIAGNOSIS — S62639A Displaced fracture of distal phalanx of unspecified finger, initial encounter for closed fracture: Secondary | ICD-10-CM | POA: Diagnosis not present

## 2023-12-27 DIAGNOSIS — I2510 Atherosclerotic heart disease of native coronary artery without angina pectoris: Secondary | ICD-10-CM | POA: Diagnosis present

## 2023-12-27 DIAGNOSIS — W2201XA Walked into wall, initial encounter: Secondary | ICD-10-CM | POA: Diagnosis not present

## 2023-12-27 DIAGNOSIS — A419 Sepsis, unspecified organism: Secondary | ICD-10-CM | POA: Diagnosis not present

## 2023-12-27 DIAGNOSIS — I10 Essential (primary) hypertension: Secondary | ICD-10-CM | POA: Diagnosis not present

## 2023-12-27 DIAGNOSIS — E1122 Type 2 diabetes mellitus with diabetic chronic kidney disease: Secondary | ICD-10-CM | POA: Diagnosis not present

## 2023-12-27 DIAGNOSIS — I482 Chronic atrial fibrillation, unspecified: Secondary | ICD-10-CM | POA: Diagnosis not present

## 2023-12-27 DIAGNOSIS — R6510 Systemic inflammatory response syndrome (SIRS) of non-infectious origin without acute organ dysfunction: Secondary | ICD-10-CM

## 2023-12-27 DIAGNOSIS — R627 Adult failure to thrive: Secondary | ICD-10-CM | POA: Diagnosis not present

## 2023-12-27 DIAGNOSIS — E876 Hypokalemia: Secondary | ICD-10-CM | POA: Diagnosis not present

## 2023-12-27 DIAGNOSIS — M1612 Unilateral primary osteoarthritis, left hip: Secondary | ICD-10-CM | POA: Diagnosis not present

## 2023-12-27 DIAGNOSIS — R1310 Dysphagia, unspecified: Secondary | ICD-10-CM | POA: Diagnosis not present

## 2023-12-27 DIAGNOSIS — E7800 Pure hypercholesterolemia, unspecified: Secondary | ICD-10-CM | POA: Diagnosis not present

## 2023-12-27 DIAGNOSIS — L89153 Pressure ulcer of sacral region, stage 3: Secondary | ICD-10-CM | POA: Diagnosis not present

## 2023-12-27 DIAGNOSIS — R918 Other nonspecific abnormal finding of lung field: Secondary | ICD-10-CM | POA: Diagnosis not present

## 2023-12-27 DIAGNOSIS — M19011 Primary osteoarthritis, right shoulder: Secondary | ICD-10-CM | POA: Diagnosis not present

## 2023-12-27 DIAGNOSIS — I480 Paroxysmal atrial fibrillation: Secondary | ICD-10-CM | POA: Diagnosis not present

## 2023-12-27 DIAGNOSIS — M5032 Other cervical disc degeneration, mid-cervical region, unspecified level: Secondary | ICD-10-CM | POA: Diagnosis not present

## 2023-12-27 DIAGNOSIS — R0602 Shortness of breath: Secondary | ICD-10-CM | POA: Diagnosis not present

## 2023-12-27 DIAGNOSIS — E872 Acidosis, unspecified: Secondary | ICD-10-CM | POA: Diagnosis not present

## 2023-12-27 DIAGNOSIS — M4802 Spinal stenosis, cervical region: Secondary | ICD-10-CM | POA: Diagnosis not present

## 2023-12-27 DIAGNOSIS — K819 Cholecystitis, unspecified: Secondary | ICD-10-CM | POA: Diagnosis not present

## 2023-12-27 DIAGNOSIS — J984 Other disorders of lung: Secondary | ICD-10-CM | POA: Diagnosis not present

## 2023-12-27 DIAGNOSIS — E860 Dehydration: Secondary | ICD-10-CM | POA: Diagnosis not present

## 2023-12-27 DIAGNOSIS — I5032 Chronic diastolic (congestive) heart failure: Secondary | ICD-10-CM | POA: Diagnosis not present

## 2023-12-27 DIAGNOSIS — I4891 Unspecified atrial fibrillation: Secondary | ICD-10-CM | POA: Diagnosis not present

## 2023-12-27 DIAGNOSIS — G9341 Metabolic encephalopathy: Secondary | ICD-10-CM | POA: Diagnosis not present

## 2023-12-27 DIAGNOSIS — R4182 Altered mental status, unspecified: Secondary | ICD-10-CM | POA: Diagnosis not present

## 2023-12-27 DIAGNOSIS — I5043 Acute on chronic combined systolic (congestive) and diastolic (congestive) heart failure: Secondary | ICD-10-CM | POA: Diagnosis not present

## 2023-12-27 DIAGNOSIS — R0989 Other specified symptoms and signs involving the circulatory and respiratory systems: Secondary | ICD-10-CM | POA: Diagnosis not present

## 2023-12-27 DIAGNOSIS — N1831 Chronic kidney disease, stage 3a: Secondary | ICD-10-CM | POA: Diagnosis not present

## 2023-12-27 DIAGNOSIS — M25511 Pain in right shoulder: Secondary | ICD-10-CM | POA: Diagnosis not present

## 2023-12-27 DIAGNOSIS — Z66 Do not resuscitate: Secondary | ICD-10-CM | POA: Diagnosis not present

## 2023-12-27 DIAGNOSIS — I130 Hypertensive heart and chronic kidney disease with heart failure and stage 1 through stage 4 chronic kidney disease, or unspecified chronic kidney disease: Secondary | ICD-10-CM | POA: Diagnosis not present

## 2023-12-27 DIAGNOSIS — E1151 Type 2 diabetes mellitus with diabetic peripheral angiopathy without gangrene: Secondary | ICD-10-CM | POA: Diagnosis not present

## 2023-12-27 DIAGNOSIS — M25552 Pain in left hip: Secondary | ICD-10-CM | POA: Diagnosis not present

## 2023-12-27 DIAGNOSIS — Z6831 Body mass index (BMI) 31.0-31.9, adult: Secondary | ICD-10-CM | POA: Diagnosis not present

## 2023-12-27 DIAGNOSIS — Z1152 Encounter for screening for COVID-19: Secondary | ICD-10-CM | POA: Diagnosis not present

## 2023-12-27 DIAGNOSIS — R52 Pain, unspecified: Secondary | ICD-10-CM | POA: Diagnosis not present

## 2023-12-27 DIAGNOSIS — R0609 Other forms of dyspnea: Secondary | ICD-10-CM | POA: Diagnosis not present

## 2023-12-27 DIAGNOSIS — F0390 Unspecified dementia without behavioral disturbance: Secondary | ICD-10-CM | POA: Diagnosis not present

## 2023-12-27 DIAGNOSIS — Z7189 Other specified counseling: Secondary | ICD-10-CM | POA: Diagnosis not present

## 2023-12-27 DIAGNOSIS — J690 Pneumonitis due to inhalation of food and vomit: Secondary | ICD-10-CM | POA: Diagnosis not present

## 2023-12-27 LAB — RESPIRATORY PANEL BY PCR

## 2023-12-27 LAB — COMPREHENSIVE METABOLIC PANEL WITH GFR
ALT: 9 U/L (ref 0–44)
AST: 15 U/L (ref 15–41)
Albumin: 2.6 g/dL — ABNORMAL LOW (ref 3.5–5.0)
Alkaline Phosphatase: 67 U/L (ref 38–126)
Anion gap: 10 (ref 5–15)
BUN: 10 mg/dL (ref 8–23)
CO2: 25 mmol/L (ref 22–32)
Calcium: 8.6 mg/dL — ABNORMAL LOW (ref 8.9–10.3)
Chloride: 102 mmol/L (ref 98–111)
Creatinine, Ser: 0.77 mg/dL (ref 0.61–1.24)
GFR, Estimated: >60 mL/min (ref >60)
Glucose, Bld: 104 mg/dL — ABNORMAL HIGH (ref 70–99)
Potassium: 3.4 mmol/L — ABNORMAL LOW (ref 3.5–5.1)
Sodium: 137 mmol/L (ref 135–145)
Total Bilirubin: 3.8 mg/dL — ABNORMAL HIGH (ref 0.0–1.2)
Total Protein: 5.6 g/dL — ABNORMAL LOW (ref 6.5–8.1)

## 2023-12-27 LAB — CBC WITH DIFFERENTIAL/PLATELET
Abs Immature Granulocytes: 0.02 10*3/uL (ref 0.00–0.07)
Basophils Absolute: 0 10*3/uL (ref 0.0–0.1)
Basophils Relative: 0 %
Eosinophils Absolute: 0.1 10*3/uL (ref 0.0–0.5)
Eosinophils Relative: 1 %
HCT: 33.6 % — ABNORMAL LOW (ref 39.0–52.0)
Hemoglobin: 10.8 g/dL — ABNORMAL LOW (ref 13.0–17.0)
Immature Granulocytes: 0 %
Lymphocytes Relative: 16 %
Lymphs Abs: 1.2 10*3/uL (ref 0.7–4.0)
MCH: 32 pg (ref 26.0–34.0)
MCHC: 32.1 g/dL (ref 30.0–36.0)
MCV: 99.7 fL (ref 80.0–100.0)
Monocytes Absolute: 0.7 10*3/uL (ref 0.1–1.0)
Monocytes Relative: 9 %
Neutro Abs: 5.8 10*3/uL (ref 1.7–7.7)
Neutrophils Relative %: 74 %
Platelets: 110 10*3/uL — ABNORMAL LOW (ref 150–400)
RBC: 3.37 MIL/uL — ABNORMAL LOW (ref 4.22–5.81)
RDW: 14.4 % (ref 11.5–15.5)
WBC: 7.8 10*3/uL (ref 4.0–10.5)
nRBC: 0.4 % — ABNORMAL HIGH (ref 0.0–0.2)

## 2023-12-27 LAB — VITAMIN B12: Vitamin B-12: 298 pg/mL (ref 180–914)

## 2023-12-27 LAB — BRAIN NATRIURETIC PEPTIDE: B Natriuretic Peptide: 396.8 pg/mL — ABNORMAL HIGH (ref 0.0–100.0)

## 2023-12-27 LAB — MRSA NEXT GEN BY PCR, NASAL: MRSA by PCR Next Gen: DETECTED — AB

## 2023-12-27 LAB — HIV ANTIBODY (ROUTINE TESTING W REFLEX): HIV Screen 4th Generation wRfx: NONREACTIVE

## 2023-12-27 LAB — RPR: RPR Ser Ql: NONREACTIVE

## 2023-12-27 LAB — MAGNESIUM: Magnesium: 2 mg/dL (ref 1.7–2.4)

## 2023-12-27 LAB — TSH: TSH: 2.076 u[IU]/mL (ref 0.350–4.500)

## 2023-12-27 LAB — CK: Total CK: 22 U/L — ABNORMAL LOW (ref 49–397)

## 2023-12-27 LAB — BILIRUBIN, DIRECT: Bilirubin, Direct: 0.5 mg/dL — ABNORMAL HIGH (ref 0.0–0.2)

## 2023-12-27 LAB — PROCALCITONIN: Procalcitonin: 0.1 ng/mL

## 2023-12-27 LAB — PHOSPHORUS: Phosphorus: 2.4 mg/dL — ABNORMAL LOW (ref 2.5–4.6)

## 2023-12-27 LAB — C-REACTIVE PROTEIN: CRP: 20 mg/dL — ABNORMAL HIGH (ref <1.0)

## 2023-12-27 MED ORDER — MIDODRINE HCL 5 MG PO TABS
5.0000 mg | ORAL_TABLET | Freq: Two times a day (BID) | ORAL | Status: DC
  Administered 2023-12-27: 5 mg via ORAL

## 2023-12-27 MED ORDER — GERHARDT'S BUTT CREAM
TOPICAL_CREAM | Freq: Two times a day (BID) | CUTANEOUS | Status: DC
  Filled 2023-12-27: qty 60

## 2023-12-27 MED ORDER — TAMSULOSIN HCL 0.4 MG PO CAPS
0.4000 mg | ORAL_CAPSULE | Freq: Every day | ORAL | Status: DC
  Administered 2023-12-27 – 2024-01-01 (×6): 0.4 mg via ORAL
  Filled 2023-12-27 (×7): qty 1

## 2023-12-27 MED ORDER — POTASSIUM PHOSPHATES 15 MMOLE/5ML IV SOLN
30.0000 mmol | Freq: Once | INTRAVENOUS | Status: AC
  Administered 2023-12-27: 30 mmol via INTRAVENOUS
  Filled 2023-12-27: qty 10

## 2023-12-27 MED ORDER — DOXYCYCLINE HYCLATE 100 MG PO TABS
100.0000 mg | ORAL_TABLET | Freq: Two times a day (BID) | ORAL | Status: DC
  Administered 2023-12-27 – 2023-12-30 (×7): 100 mg via ORAL
  Filled 2023-12-27 (×7): qty 1

## 2023-12-27 MED ORDER — PANTOPRAZOLE SODIUM 40 MG PO TBEC
40.0000 mg | DELAYED_RELEASE_TABLET | Freq: Every day | ORAL | Status: DC
  Administered 2023-12-27 – 2024-01-01 (×6): 40 mg via ORAL
  Filled 2023-12-27 (×7): qty 1

## 2023-12-27 MED ORDER — PROSOURCE PLUS PO LIQD
30.0000 mL | Freq: Two times a day (BID) | ORAL | Status: DC
  Administered 2023-12-27 – 2023-12-30 (×7): 30 mL via ORAL
  Filled 2023-12-27 (×7): qty 30

## 2023-12-27 MED ORDER — SODIUM CHLORIDE 0.9 % IV SOLN
3.0000 g | Freq: Four times a day (QID) | INTRAVENOUS | Status: DC
  Administered 2023-12-27 – 2023-12-30 (×14): 3 g via INTRAVENOUS
  Filled 2023-12-27 (×14): qty 8

## 2023-12-27 MED ORDER — ROSUVASTATIN CALCIUM 5 MG PO TABS
10.0000 mg | ORAL_TABLET | Freq: Every day | ORAL | Status: DC
  Administered 2023-12-27 – 2023-12-30 (×4): 10 mg via ORAL
  Filled 2023-12-27 (×4): qty 2

## 2023-12-27 MED ORDER — FLUTICASONE PROPIONATE 50 MCG/ACT NA SUSP
2.0000 | Freq: Every day | NASAL | Status: DC
  Administered 2023-12-27 – 2024-01-03 (×8): 2 via NASAL
  Filled 2023-12-27: qty 16

## 2023-12-27 MED ORDER — MUPIROCIN 2 % EX OINT
1.0000 | TOPICAL_OINTMENT | Freq: Two times a day (BID) | CUTANEOUS | Status: AC
  Administered 2023-12-27 – 2023-12-31 (×10): 1 via NASAL
  Filled 2023-12-27 (×2): qty 22

## 2023-12-27 MED ORDER — FUROSEMIDE 10 MG/ML IJ SOLN
40.0000 mg | Freq: Once | INTRAMUSCULAR | Status: AC
Start: 2023-12-27 — End: 2023-12-27
  Administered 2023-12-27: 40 mg via INTRAVENOUS
  Filled 2023-12-27: qty 4

## 2023-12-27 MED ORDER — METOPROLOL TARTRATE 5 MG/5ML IV SOLN: 5.0000 mg | Freq: Three times a day (TID) | INTRAVENOUS | Status: DC | PRN

## 2023-12-27 MED ORDER — ENOXAPARIN SODIUM 120 MG/0.8ML IJ SOSY
110.0000 mg | PREFILLED_SYRINGE | Freq: Two times a day (BID) | INTRAMUSCULAR | Status: DC
Start: 2023-12-27 — End: 2023-12-30
  Administered 2023-12-27 – 2023-12-30 (×7): 110 mg via SUBCUTANEOUS
  Filled 2023-12-27 (×9): qty 0.74

## 2023-12-27 MED ORDER — IOHEXOL 350 MG/ML SOLN
75.0000 mL | Freq: Once | INTRAVENOUS | Status: AC | PRN
  Administered 2023-12-27: 75 mL via INTRAVENOUS

## 2023-12-27 MED ORDER — LORATADINE 10 MG PO TABS
10.0000 mg | ORAL_TABLET | Freq: Every day | ORAL | Status: DC
  Administered 2023-12-27 – 2024-01-01 (×6): 10 mg via ORAL
  Filled 2023-12-27 (×7): qty 1

## 2023-12-27 MED ORDER — METOPROLOL SUCCINATE ER 25 MG PO TB24: 12.5000 mg | ORAL_TABLET | Freq: Every day | ORAL | Status: DC

## 2023-12-27 MED ORDER — METOPROLOL TARTRATE 25 MG PO TABS
25.0000 mg | ORAL_TABLET | Freq: Two times a day (BID) | ORAL | Status: DC
  Administered 2023-12-27 – 2023-12-31 (×8): 25 mg via ORAL
  Filled 2023-12-27 (×8): qty 1

## 2023-12-27 MED ORDER — MEDIHONEY WOUND/BURN DRESSING EX PSTE
1.0000 | PASTE | Freq: Every day | CUTANEOUS | Status: DC
  Administered 2023-12-27 – 2024-01-03 (×8): 1 via TOPICAL
  Filled 2023-12-27: qty 44

## 2023-12-27 MED ORDER — MIDODRINE HCL 5 MG PO TABS: 10.0000 mg | ORAL_TABLET | Freq: Three times a day (TID) | ORAL | Status: DC

## 2023-12-27 MED ORDER — POTASSIUM CHLORIDE CRYS ER 20 MEQ PO TBCR
40.0000 meq | EXTENDED_RELEASE_TABLET | Freq: Once | ORAL | Status: AC
  Administered 2023-12-27: 40 meq via ORAL
  Filled 2023-12-27: qty 2

## 2023-12-27 MED ORDER — QUETIAPINE FUMARATE 25 MG PO TABS
25.0000 mg | ORAL_TABLET | Freq: Every day | ORAL | Status: DC
  Administered 2023-12-27 – 2024-01-01 (×6): 25 mg via ORAL
  Filled 2023-12-27 (×6): qty 1

## 2023-12-27 MED ORDER — CHLORHEXIDINE GLUCONATE CLOTH 2 % EX PADS
6.0000 | MEDICATED_PAD | Freq: Every day | CUTANEOUS | Status: AC
  Administered 2023-12-27 – 2023-12-31 (×5): 6 via TOPICAL

## 2023-12-27 MED ORDER — LACTATED RINGERS IV SOLN
INTRAVENOUS | Status: DC
Start: 2023-12-27 — End: 2023-12-27

## 2023-12-27 MED ORDER — SPIRONOLACTONE 25 MG PO TABS
25.0000 mg | ORAL_TABLET | Freq: Once | ORAL | Status: AC
  Administered 2023-12-27: 25 mg via ORAL
  Filled 2023-12-27: qty 1

## 2023-12-27 MED ORDER — ACETAMINOPHEN 325 MG PO TABS
650.0000 mg | ORAL_TABLET | Freq: Four times a day (QID) | ORAL | Status: DC | PRN
  Administered 2023-12-31: 650 mg via ORAL
  Filled 2023-12-27: qty 2

## 2023-12-27 NOTE — TOC Initial Note (Signed)
 Transition of Care [facility]) - Initial/Assessment Note    Patient Details  Name: Dylan [name] MRN: [id number] Date of Birth: Sep 24, 1925  Transition of Care [facility]) CM/SW Contact:    Jannice Mends, LCSW Phone Number: 12/27/2023, 10:44 AM  Clinical Narrative:                 CSW confirmed that patient recently transitioned to long term care at [facility]. Facility requests to attempt rehab insurance authorization prior to discharge but will not delay discharge.   Expected Discharge Plan: Skilled Nursing Facility Barriers to Discharge: Continued Medical Work up   Patient Goals and CMS Choice Patient states their goals for this hospitalization and ongoing recovery are:: Return to snf          Expected Discharge Plan and Services In-house Referral: Clinical Social Work   Post Acute Care Choice: Skilled Nursing Facility Living arrangements for the past 2 months: Skilled Nursing Facility                                      Prior Living Arrangements/Services Living arrangements for the past 2 months: Skilled Nursing Facility Lives with:: Facility Resident Patient language and need for interpreter reviewed:: Yes Do you feel safe going back to the place where you live?: Yes      Need for Family Participation in Patient Care: Yes (Comment) Care giver support system in place?: Yes (comment)   Criminal Activity/Legal Involvement Pertinent to Current Situation/Hospitalization: No - Comment as needed  Activities of Daily Living   ADL Screening (condition at time of admission) Independently performs ADLs?: No Does the patient have a NEW difficulty with bathing/dressing/toileting/self-feeding that is expected to last >3 days?: No Does the patient have a NEW difficulty with getting in/out of bed, walking, or climbing stairs that is expected to last >3 days?: No Does the patient have a NEW difficulty with communication that is expected to last >3 days?: No Is the patient deaf  or have difficulty hearing?: Yes Does the patient have difficulty seeing, even when wearing glasses/contacts?: No Does the patient have difficulty concentrating, remembering, or making decisions?: Yes  Permission Sought/Granted Permission sought to share information with : Facility Medical sales representative, Family Supports Permission granted to share information with : Yes, Verbal Permission Granted  Share Information with NAME: Georgette Kins  Permission granted to share info w AGENCY: [facility]  Permission granted to share info w Relationship: Son  Permission granted to share info w Contact Information: [phone]  Emotional Assessment Appearance:: Appears stated age Attitude/Demeanor/Rapport: Unable to Assess Affect (typically observed): Unable to Assess Orientation: : Oriented to Self Alcohol  / Substance Use: Not Applicable Psych Involvement: No (comment)  Admission diagnosis:  SIRS (systemic inflammatory response syndrome) (HCC) [R65.10] Sepsis with acute organ dysfunction without septic shock, due to unspecified organism, unspecified organ dysfunction type (HCC) [A41.9, R65.20] Patient Active Problem List   Diagnosis Date Noted   SIRS (systemic inflammatory response syndrome) (HCC) 12/26/2023   Acute metabolic encephalopathy 12/26/2023   Lactic acidosis 12/26/2023   Dehydration 12/26/2023   Hyperbilirubinemia 12/26/2023   Thumb fracture 12/26/2023   Closed fracture of distal phalanx of digit of left hand 12/26/2023   Frequent falls 11/18/2023   Hypokalemia 11/08/2023   First degree AV block 09/13/2022   Orthostatic intolerance 09/13/2022   Contusion of toe 06/22/2022   Pressure injury of skin 06/11/2022   Fall at home, initial  encounter 06/10/2022   Closed rib fracture 06/10/2022   Rib fracture 06/10/2022   PVD (peripheral vascular disease) (HCC) 12/04/2021   Actinic keratosis 02/06/2021   Cough 02/06/2021   Encounter for general adult medical examination without abnormal findings  02/06/2021   Hyperglycemia 02/06/2021   Neuropathy 02/06/2021   Pure hypercholesterolemia 02/06/2021   Sinusitis 02/06/2021   Sinus node dysfunction (HCC) 01/11/2020   Pacemaker 01/11/2020   Pain due to onychomycosis of toenails of both feet 10/14/2019   Diabetic neuropathy (HCC) 10/14/2019   Type 2 diabetes mellitus with vascular disease (HCC) 10/14/2019   Coagulation disorder (HCC) 10/14/2019   Chronic diastolic CHF (congestive heart failure) (HCC) 07/28/2019   Pneumonia due to COVID-19 virus 07/28/2019   Periorbital hematoma of left eye 10/15/2017   Fall 10/15/2017   HCAP (healthcare-associated pneumonia) 10/14/2017   Bradycardia 10/03/2017   Acute CHF (congestive heart failure) (HCC) 08/03/2017   Atrial fibrillation (HCC) 07/14/2017   Diastolic dysfunction with acute on chronic heart failure (HCC) 07/14/2017   DOE (dyspnea on exertion) 08/24/2014   Benign essential HTN 08/24/2014   Type 2 diabetes mellitus without complication (HCC) 08/24/2014   Dyslipidemia 08/24/2014   PCP:  Suzzanne Estrin, MD Pharmacy:   [facility] [address] [place] - [address] [place] [address] Phone: [phone] Fax: [phone]  [facility] - [place], [place] - [address] [facility] AT Portal to Registered [facility] Sites [address] [place] [address] Phone: [phone] Fax: [phone]  [facility] [address] [place] [address] Phone: [phone] Fax: [phone]     Social Drivers of Health (SDOH) Social History: SDOH Screenings   Food Insecurity: Patient Unable To Answer (12/26/2023)  Housing: Patient Unable To Answer (12/26/2023)  Transportation Needs: Patient Unable To Answer (12/26/2023)  Utilities: Patient Unable To Answer (12/26/2023)  Social Connections: Patient Declined (11/18/2023)  Tobacco Use: Medium Risk (12/26/2023)   SDOH Interventions:      Readmission Risk Interventions     No data to display

## 2023-12-27 NOTE — Progress Notes (Signed)
 PT Cancellation Note  Patient Details Name: Dylan [name] MRN: [id number] DOB: 04-20-1926   Cancelled Treatment:    Reason Eval/Treat Not Completed: Patient at procedure or test/unavailable, going for xray. Will follow up for PT Evaluation later today as schedule permits.  Sherrye Puga, PT, DPT Acute Rehabilitation Services  Personal: Secure Chat Rehab Office: [phone]  Albino Hum 12/27/2023, 8:51 AM

## 2023-12-27 NOTE — Evaluation (Signed)
 Clinical/Bedside Swallow Evaluation Patient Details  Name: Dylan [name] MRN: [id number] Date of Birth: 05-29-1926  Today's Date: 12/27/2023 Time: SLP Start Time (ACUTE ONLY): 0932 SLP Stop Time (ACUTE ONLY): 1040 SLP Time Calculation (min) (ACUTE ONLY): 68 min  Past Medical History:  Past Medical History:  Diagnosis Date   COVID-19    Diabetes mellitus without complication (HCC)    Dyspnea    History of hiatal hernia    Hyperglycemia    Hyperlipidemia    Hypertension    Lung nodule    Myocardial infarction (HCC)    Presence of permanent cardiac pacemaker    Past Surgical History:  Past Surgical History:  Procedure Laterality Date   APPENDECTOMY  1956   BACK SURGERY  2008   CARDIAC CATHETERIZATION  1995   negative results   CARDIOVERSION N/A 08/05/2017   Procedure: CARDIOVERSION;  Surgeon: Darlis Eisenmenger, MD;  Location: [facility] ENDOSCOPY;  Service: Cardiovascular;  Laterality: N/A;   CARDIOVERSION N/A 09/12/2017   Procedure: CARDIOVERSION;  Surgeon: Darlis Eisenmenger, MD;  Location: [facility] ENDOSCOPY;  Service: Cardiovascular;  Laterality: N/A;   CARDIOVERSION N/A 09/15/2021   Procedure: CARDIOVERSION;  Surgeon: Darlis Eisenmenger, MD;  Location: [facility] ENDOSCOPY;  Service: Cardiovascular;  Laterality: N/A;   CARDIOVERSION N/A 10/12/2021   Procedure: CARDIOVERSION;  Surgeon: Darlis Eisenmenger, MD;  Location: [facility] ENDOSCOPY;  Service: Cardiovascular;  Laterality: N/A;   COLON SURGERY  07/09/12   HERNIA REPAIR  1952   HOT HEMOSTASIS  01/08/2012   Procedure: HOT HEMOSTASIS (ARGON PLASMA COAGULATION/BICAP);  Surgeon: Evangeline Hilts, MD;  Location: [facility] ENDOSCOPY;  Service: Endoscopy;  Laterality: N/A;   LAPAROSCOPIC ILEOCECECTOMY  07/09/2012   Procedure: LAPAROSCOPIC ILEOCECECTOMY;  Surgeon: Quitman Bucy, MD;  Location: [facility] ORS;  Service: General;  Laterality: N/A;  Laparoscopic Ileocecectomy   PACEMAKER IMPLANT N/A 10/05/2017   Procedure: PACEMAKER IMPLANT;  Surgeon: Verona Goodwill, MD;   Location: [facility] INVASIVE CV LAB;  Service: Cardiovascular;  Laterality: N/A;   REPLACEMENT TOTAL KNEE  2010   TEE WITHOUT CARDIOVERSION N/A 08/05/2017   Procedure: TRANSESOPHAGEAL ECHOCARDIOGRAM (TEE);  Surgeon: Darlis Eisenmenger, MD;  Location: [facility] ENDOSCOPY;  Service: Cardiovascular;  Laterality: N/A;   TEE WITHOUT CARDIOVERSION N/A 09/12/2017   Procedure: TRANSESOPHAGEAL ECHOCARDIOGRAM (TEE);  Surgeon: Darlis Eisenmenger, MD;  Location: [facility] ENDOSCOPY;  Service: Cardiovascular;  Laterality: N/A;   HPI:  Pt is a 89 y.o. male known to SLP services with recent MBS on 11/20/23 (inpatient) and 12/17/23 (outpatient). He has a history of silent aspiration of thin liquids during inpatient MBSS with recommendations for nectar-thick liquids, though demonstrated improvements on outpatient MBSS and was advanced to a regular diet and thin liquids .  He currently resides at [facility].  PMHx a-fib, orthostatic hypotension, DM, HTN, CHF, CHF, CKD stage IIIa. He has had multiple falls in recent months and is admitted with SIRS.    Assessment / Plan / Recommendation  Clinical Impression   Pt with a known hx of dysphagia, though was able to safely advance to a regular diet and thin liquids with use of a chin tuck (straw ok) following MBSS completed on 12/17/23.  Per son, pt recently downgraded to a mechanical soft diet and nectar-thick liquids due to frequent coughing with thin liquids observed during meals.   Pt presents with a grossly functional oral phase and concerns for a pharyngeal dysphagia today per clinical swallow assessment. His baseline swallowing deficits are likely exacerbated in the setting of acute deconditioning.  Pt exhibited frequent coughing with cup and straw sips of thin liquids and straw sips of nectar thick liquids. Per son, pt is unable to cognitively follow directions to consistently use chin tuck. No coughing observed with thin liquids in 10cc provale cup or with mechanical soft solids.   Detailed  diet recommendations outlined below. Essentially recommend that pt continue with nectar thick liquids at this time, though start free water protocol with use of 10cc Provale cup (provided). Written recommendations outlined and a copy was provided to family.   SLP will follow up to assess diet tolerance and modify diet as indicated. Will follow up for cognitive-linguistic assessment.   SLP Visit Diagnosis: Dysphagia, oropharyngeal phase (R13.12)    Aspiration Risk  Mild aspiration risk;Moderate aspiration risk    Diet Recommendation Dysphagia 3 (Mech soft);Nectar-thick liquid (ok for thin water only in 10cc Provale cup)  Drink nectar-thick liquids with meals.   Liquid Administration via: Cup;No straw (Provale cup for thin water) Medication Administration: Whole meds with puree Supervision: Intermittent supervision to cue for compensatory strategies (feeding assistance needed) Compensations: Minimize environmental distractions;Slow rate;Small sips/bites (pt unable to cognitively do chin tuck, though was beneficial during last MBSS) Postural Changes: Seated upright at 90 degrees;Remain upright for at least 30 minutes after po intake    Other  Recommendations Oral Care Recommendations: Oral care QID    Recommendations for follow up therapy are one component of a multi-disciplinary discharge planning process, led by the attending physician.  Recommendations may be updated based on patient status, additional functional criteria and insurance authorization.  Follow up Recommendations Skilled nursing-short term rehab (<3 hours/day)      Assistance Recommended at Discharge  Defer to PT/OT. Recommend 24/7 supervision   Functional Status Assessment Patient has had a recent decline in their functional status and demonstrates the ability to make significant improvements in function in a reasonable and predictable amount of time.  Frequency and Duration min 2x/week  4 weeks       Prognosis  Prognosis for improved oropharyngeal function: Fair Barriers to Reach Goals: Cognitive deficits      Swallow Study   General Date of Onset: 12/26/23 HPI: Pt is a 89 y.o. male known to SLP services with recent MBS on 11/20/23 (inpatient) and 12/17/23 (outpatient). He has a history of silent aspiration of thin liquids during inpatient MBSS with recommendations for nectar-thick liquids, though demonstrated improvements on outpatient MBSS and was advanced to a regular diet and thin liquids .  He currently resides at [facility].  PMHx a-fib, orthostatic hypotension, DM, HTN, CHF, CHF, CKD stage IIIa. He has had multiple falls in recent months and is admitted with SIRS. Type of Study: Bedside Swallow Evaluation Previous Swallow Assessment: MBS 12/17/23 with recommendation of a regular diet and thin liquids Diet Prior to this Study: Dysphagia 3 (mechanical soft);Mildly thick liquids (Level 2, nectar thick) Temperature Spikes Noted: No Respiratory Status: Nasal cannula (3L) History of Recent Intubation: No Behavior/Cognition: Alert;Cooperative;Pleasant mood Oral Cavity Assessment: Within Functional Limits Oral Cavity - Dentition: Missing dentition (partial in place) Vision: Functional for self-feeding (reduced UE strength) Self-Feeding Abilities: Needs assist Patient Positioning: Upright in bed Baseline Vocal Quality: Normal Volitional Swallow: Able to elicit    Oral/Motor/Sensory Function Overall Oral Motor/Sensory Function: Within functional limits   Ice Chips Ice chips: Not tested   Thin Liquid Thin Liquid: Impaired Pharyngeal  Phase Impairments: Cough - Delayed;Cough - Immediate    Nectar Thick Nectar Thick Liquid: Impaired Presentation: Cup;Straw Pharyngeal Phase Impairments:  Cough - Immediate Other Comments: coughing with straw sips only   Honey Thick Honey Thick Liquid: Not tested   Puree Puree: Within functional limits Presentation: Spoon   Solid     Solid: Within functional  limits Presentation: Spoon      Maddix Kliewer J Bostyn Bogie 12/27/2023,12:52 PM

## 2023-12-27 NOTE — Progress Notes (Signed)
   12/26/23 2000  Assess: MEWS Score  Temp 98.5 F (36.9 C)  BP (!) 157/90  MAP (mmHg) 105  Pulse Rate (!) 101  ECG Heart Rate (!) 108  Resp (!) 22  SpO2 93 %  O2 Device Nasal Cannula  O2 Flow Rate (L/min) 3 L/min  Assess: MEWS Score  MEWS Temp 0  MEWS Systolic 0  MEWS Pulse 1  MEWS RR 1  MEWS LOC 0  MEWS Score 2  MEWS Score Color Yellow  Assess: if the MEWS score is Yellow or Red  Were vital signs accurate and taken at a resting state? Yes  Does the patient meet 2 or more of the SIRS criteria? Yes  Does the patient have a confirmed or suspected source of infection? No  MEWS guidelines implemented  Yes, yellow  Treat  MEWS Interventions Considered administering scheduled or prn medications/treatments as ordered  Take Vital Signs  Increase Vital Sign Frequency  Yellow: Q2hr x1, continue Q4hrs until patient remains green for 12hrs  Escalate  MEWS: Escalate Yellow: Discuss with charge nurse and consider notifying provider and/or RRT  Notify: Charge Nurse/RN  Name of Charge Nurse/RN Notified sarah,RN  Assess: SIRS CRITERIA  SIRS Temperature  0  SIRS Respirations  1  SIRS Pulse 1  SIRS WBC 0  SIRS Score Sum  2   Pt new admit for SIRS, no known source at this time. No distress noted.

## 2023-12-27 NOTE — Progress Notes (Signed)
 OT Cancellation Note  Patient Details Name: Dylan [name] MRN: [id number] DOB: 08-31-1925   Cancelled Treatment:    Reason Eval/Treat Not Completed: Other (comment).  Transport arrived for patient a few minutes into OT eval; OT will follow up next available time  Alfred Ann 12/27/2023, 12:10 PM

## 2023-12-27 NOTE — Progress Notes (Signed)
 Orthopedic Tech Progress Note Patient Details:  Dylan [name] 1926/07/28 [id number]  Ortho Devices Type of Ortho Device: Ulna gutter splint, Ace wrap, Cotton web roll Ortho Device/Splint Location: LUE Ortho Device/Splint Interventions: Ordered, Application, Adjustment   Post Interventions Patient Tolerated: Well Instructions Provided: Adjustment of device  [name] OTR/L 12/27/2023, 3:34 PM

## 2023-12-27 NOTE — Progress Notes (Signed)
 PT Cancellation Note  Patient Details Name: ABDALRAHMAN MANFORD MRN: [id number] DOB: 08/06/1926   Cancelled Treatment:    Reason Eval/Treat Not Completed: Transport arrived for patient a few minutes into PT Evaluation; will follow up to continue PT Evaluation as schedule permits, likely tomorrow.  Maycee Blasco, PT, DPT Acute Rehabilitation Services  Personal: Secure Chat Rehab Office: [phone]  Kharma Sampsel L Tenise Stetler 12/27/2023, 11:45 AM

## 2023-12-27 NOTE — Progress Notes (Signed)
 PHARMACY - ANTICOAGULATION CONSULT NOTE  Pharmacy Consult for lovenox  Indication: atrial fibrillation  Allergies  Allergen Reactions   Keflex [Cephalexin] Hives and Other (See Comments)    Occurred in the 1980's Tolerated ceftriaxone  11/18/23    Patient Measurements: Weight: 111.9 kg (246 lb 11.1 oz)  Vital Signs: Temp: 97.6 F (36.4 C) (05/09 0338) Temp Source: Axillary (05/09 0338) BP: 138/52 (05/09 0338) Pulse Rate: 91 (05/09 0338)  Labs: Recent Labs    12/26/23 1432 12/27/23 0547  HGB 11.2* 10.8*  HCT 34.4* 33.6*  PLT 141* 110*  LABPROT 24.6*  --   INR 2.2*  --   CREATININE 0.71 0.77    Estimated Creatinine Clearance: 71.3 mL/min (by C-G formula based on SCr of 0.77 mg/dL).   Medical History: Past Medical History:  Diagnosis Date   COVID-19    Diabetes mellitus without complication (HCC)    Dyspnea    History of hiatal hernia    Hyperglycemia    Hyperlipidemia    Hypertension    Lung nodule    Myocardial infarction (HCC)    Presence of permanent cardiac pacemaker     Medications:  Medications Prior to Admission  Medication Sig Dispense Refill Last Dose/Taking   acetaminophen  (TYLENOL ) 500 MG tablet Take 500 mg by mouth daily in the afternoon.   12/25/2023   apixaban  (ELIQUIS ) 5 MG TABS tablet Take 1 tablet by mouth twice daily 60 tablet 11 12/26/2023 at  9:00 AM   docusate sodium  (COLACE) 100 MG capsule Take 100 mg by mouth See admin instructions. 1 capsule every week on Wednesday   12/25/2023   ibuprofen  (ADVIL ) 400 MG tablet Take 1 tablet (400 mg total) by mouth every 6 (six) hours as needed for mild pain (pain score 1-3) (or Fever >/= 101).   Unknown   ipratropium (ATROVENT ) 0.06 % nasal spray Place 1 spray into both nostrils in the morning and at bedtime.   12/26/2023   metoprolol  succinate (TOPROL -XL) 25 MG 24 hr tablet Take 0.5 tablets (12.5 mg total) by mouth daily.   12/26/2023   midodrine  (PROAMATINE ) 5 MG tablet Take 15 mg by mouth 3 (three) times  daily with meals.   12/26/2023   rosuvastatin  (CRESTOR ) 10 MG tablet Take 1 tablet by mouth once daily (Patient taking differently: Take 10 mg by mouth daily.) 90 tablet 3 12/25/2023   spironolactone  (ALDACTONE ) 25 MG tablet Take 25 mg by mouth daily.   12/26/2023   furosemide  (LASIX ) 40 MG tablet Take 1 tablet (40 mg total) by mouth daily as needed for fluid or edema. (Patient not taking: Reported on 12/26/2023)   Not Taking   lidocaine  (LIDODERM ) 5 % Place 1 patch onto the skin daily. To low back as needed Remove & Discard patch within 12 hours or as directed by MD (Patient not taking: Reported on 12/26/2023) 3 patch 0 Not Taking   oxyCODONE  (OXY IR/ROXICODONE ) 5 MG immediate release tablet Take 1 tablet (5 mg total) by mouth every 4 (four) hours as needed for moderate pain (pain score 4-6). (Patient not taking: Reported on 12/26/2023) 5 tablet 0 Not Taking   Scheduled:   (feeding supplement) PROSource Plus  30 mL Oral BID BM   Chlorhexidine  Gluconate Cloth  6 each Topical Daily   doxycycline  100 mg Oral Q12H   enoxaparin  (LOVENOX ) injection  110 mg Subcutaneous Q12H   fluticasone  2 spray Each Nare Daily   furosemide   40 mg Intravenous Once   Gerhardt's butt cream  Topical BID   ipratropium  1 spray Each Nare BID   leptospermum manuka honey  1 Application Topical Daily   lidocaine   1 patch Transdermal Q24H   loratadine  10 mg Oral Daily   [START ON 12/28/2023] metoprolol  succinate  12.5 mg Oral Daily   midodrine   10 mg Oral TID WC   mupirocin ointment  1 Application Nasal BID   pantoprazole  40 mg Oral Daily   QUEtiapine  25 mg Oral QHS   rosuvastatin   10 mg Oral Daily   senna  1 tablet Oral BID   sodium chloride  flush  3 mL Intravenous Q12H   spironolactone   25 mg Oral Once   tamsulosin  0.4 mg Oral Daily   Infusions:   ampicillin-sulbactam (UNASYN) IV     potassium PHOSPHATE IVPB (in mmol)      Assessment: Pt was admitted for confusion and possible PNA. He has been on apixaban  for AF.  Plan to bridge with Lovenox  for possible cholecystectomy.  Unasyn ordered for PNA coverage along with doxy.   Hgb 10.8, plt 110k Scr <1  Goal of Therapy:  Anti-Xa level 0.6-1 units/ml 4hrs after LMWH dose given Monitor platelets by anticoagulation protocol: Yes   Plan:  Lovenox  110mg  SQ BID Unasyn 3g IV q6 F/u CBC  Ivery Marking, PharmD, BCIDP, AAHIVP, CPP Infectious Disease Pharmacist 12/27/2023 8:52 AM

## 2023-12-27 NOTE — Progress Notes (Signed)
 Heart Failure Navigator Progress Note  Assessed for Heart & Vascular TOC clinic readiness.  Patient does not meet criteria due to established with advanced heart failure clinic, patient of Dr. Mitzie Anda.   Navigator will sign off.   Jerilyn Monte, PharmD, BCPS Heart Failure Stewardship Pharmacist Phone [phone]

## 2023-12-27 NOTE — Progress Notes (Addendum)
 PROGRESS NOTE                                                                                                                                                                                                             Patient Demographics:    Dylan [name], is a 89 y.o. male, DOB - 1926/02/23, ZOX:[id number]  Outpatient Primary MD for the patient is Tisovec, Kristina Pfeiffer, MD    LOS - 0  Admit date - 12/26/2023    No chief complaint on file.      Brief Narrative (HPI from H&P)    89 y.o. male with medical history significant of A-fib on chronic anticoagulation with Eliquis , orthostatic hypotension on midodrine , diabetes mellitus, hypertension, CHF, tachybradycardia syndrome with junctional bradycardia, status post [employer] dual-chamber PPM, chronic diastolic CHF, CKD stage IIIa with baseline creatinine approximately 1, hyperlipidemia presented to the ED with confusion/altered mental status.  Patient noted to have been recently hospitalized 11/18/2023-11/21/2023 for frequent falls, UTI, dehydration.  Apparently he has been getting off-and-on confused at his assisted living facility for the first few days, and asked state of confusion he punched a wall and injured his left hand, he was found to be confused, he had evidence of left hand injury and fractures of the thumb and fourth digit, chest x-ray suggestive of aortic valve infection versus CHF, elevated total bilirubin, clinically appeared dehydrated with elevated lactic acid, there was question of SIRs from unclear source and he was admitted.   Subjective:    Adah Acron today has, No headache, No chest pain, No abdominal pain - No Nausea, No new weakness tingling or numbness, no SOB, have ongoing bilateral shoulder pain worse than right, some left ear fullness, mild neck pain, chronic left hip pain, left hand pain due to recent injury.   Assessment  & Plan :   Acute on chronic  metabolic encephalopathy.  Likely developing dementia, he is having off-and-on bouts of confusion for the last few weeks, recently had change of place which likely exacerbated his problems, there was also evidence of dehydration and SIRS at the time of admission.  Currently he will be treated with supportive care, avoid benzodiazepines and narcotics, nighttime Seroquel and monitor he likely is developing age-related cognitive decline.  Family explained that he is at risk for worsening mental status on a gradual and sustained  basis.  Head CT is nonacute, he has no headache or new focal deficits.  Will check TSH, B12, RPR.  SIRs - at the time of admission, chest x-ray suggestive of CHF versus atypical pneumonia, CT scan shows distended gallbladder with a possible gallstone, elevated total bilirubin, abdominal exam relatively benign.  For now we will place him on Unasyn and doxycycline covering for pneumonia and any underlying mild cholecystitis, follow cultures, check HIDA scan, check respiratory viral panel.  Distended gallbladder with gallstone, high total bilirubin.  Currently see above, GI input.  Mechanical injury with fracture of the left thumb and fourth digit.  Ortho input.  Dehydration with lactic acidosis.  Resolved with IV fluids, now has evidence of fluid overload, TED stockings and gentle diuresis.  Hypokalemia and hypophosphatemia.  Replaced.  Ongoing right shoulder, left hip and neck pain.  X-ray right shoulder and left hip, CT C-spine check CK levels and inflammatory markers.  Paroxysmal atrial fibrillation.  [place] vas 2 score of greater than 3.  Still cholecystitis ruled out switch Eliquis  to Lovenox , low-dose beta-blocker as tolerated, monitor on telemetry.  History of dysphagia.  On dysphagia 3 diet at ALF, speech input, at risk for ongoing aspiration.  Acute on chronic  diastolic CHF EF 60%.  Has evidence of fluid overload with some acute on chronic decompensation, gentle diuresis  as tolerated by blood pressure.  Foley catheter placement in the ER.  Reason unclear, no documented urinary retention, Flomax, DC Foley in the a.m. and monitor.    DM type II.  Diet controlled, A1c 5.9 recently.       Condition -   Guarded  Family Communication  :  son Georgette Kins [phone]  on 12/27/2023  Code Status :  DNR  Consults  :  Ortho  PUD Prophylaxis :  PPI   Procedures  :     CT C-spine.    HIDA scan.    RUQ - Distended gallbladder with a stone. No further sonographic evidence of acute cholecystitis at this time. No ductal dilatation. Right-sided pleural effusion  CT Abd Pelvis - Cardiomegaly, coronary artery disease.  Aortic atherosclerosis. Small bilateral effusions with compressive atelectasis in the lower lobes. Cholelithiasis.  No evidence for acute cholecystitis. No acute findings in the abdomen or pelvis.  CT head - Non acute      Disposition Plan  :    Status is: Observation   DVT Prophylaxis  :    Place TED hose Start: 12/27/23 0814 apixaban  (ELIQUIS ) tablet 5 mg / Lovenox     Lab Results  Component Value Date   PLT 110 (L) 12/27/2023    Diet :  Diet Order             DIET DYS 3 Room service appropriate? Yes; Fluid consistency: Nectar Thick  Diet effective now                    Inpatient Medications  Scheduled Meds:  (feeding supplement) PROSource Plus  30 mL Oral BID BM   apixaban   5 mg Oral BID   Chlorhexidine  Gluconate Cloth  6 each Topical Daily   doxycycline  100 mg Oral Q12H   fluticasone  2 spray Each Nare Daily   Gerhardt's butt cream   Topical BID   ipratropium  1 spray Each Nare BID   leptospermum manuka honey  1 Application Topical Daily   lidocaine   1 patch Transdermal Q24H   loratadine  10 mg Oral Daily   [START  ON 12/28/2023] metoprolol  succinate  12.5 mg Oral Daily   midodrine   10 mg Oral TID WC   mupirocin ointment  1 Application Nasal BID   potassium chloride   40 mEq Oral Once   QUEtiapine  25 mg Oral QHS    rosuvastatin   10 mg Oral Daily   senna  1 tablet Oral BID   sodium chloride  flush  3 mL Intravenous Q12H   tamsulosin  0.4 mg Oral Daily   Continuous Infusions:  ceFEPime  (MAXIPIME ) IV 2 g (12/27/23 0026)   potassium PHOSPHATE IVPB (in mmol)     vancomycin      PRN Meds:.acetaminophen , hydrALAZINE , ketorolac , levalbuterol , [DISCONTINUED] ondansetron  **OR** ondansetron  (ZOFRAN ) IV, polyethylene glycol, sorbitol   Antibiotics  :    Anti-infectives (From admission, onward)    Start     Dose/Rate Route Frequency Ordered Stop   12/27/23 1500  Vancomycin  (VANCOCIN ) 1,250 mg in sodium chloride  0.9 % 250 mL IVPB        1,250 mg 166.7 mL/hr over 90 Minutes Intravenous Every 24 hours 12/26/23 1938     12/27/23 1000  doxycycline (VIBRA-TABS) tablet 100 mg        100 mg Oral Every 12 hours 12/27/23 0814     12/27/23 0000  ceFEPIme  (MAXIPIME ) 2 g in sodium chloride  0.9 % 100 mL IVPB        2 g 200 mL/hr over 30 Minutes Intravenous Every 8 hours 12/26/23 1938     12/26/23 1445  aztreonam  (AZACTAM ) 2 g in sodium chloride  0.9 % 100 mL IVPB  Status:  Discontinued        2 g 200 mL/hr over 30 Minutes Intravenous  Once 12/26/23 1432 12/26/23 1441   12/26/23 1445  metroNIDAZOLE  (FLAGYL ) IVPB 500 mg  Status:  Discontinued        500 mg 100 mL/hr over 60 Minutes Intravenous  Once 12/26/23 1432 12/26/23 1441   12/26/23 1445  vancomycin  (VANCOCIN ) IVPB 1000 mg/200 mL premix  Status:  Discontinued        1,000 mg 200 mL/hr over 60 Minutes Intravenous  Once 12/26/23 1432 12/26/23 1439   12/26/23 1445  vancomycin  (VANCOREADY) IVPB 2000 mg/400 mL        2,000 mg 200 mL/hr over 120 Minutes Intravenous  Once 12/26/23 1439 12/26/23 1715   12/26/23 1445  ceFEPIme  (MAXIPIME ) 2 g in sodium chloride  0.9 % 100 mL IVPB        2 g 200 mL/hr over 30 Minutes Intravenous  Once 12/26/23 1442 12/26/23 1617   12/26/23 1445  metroNIDAZOLE  (FLAGYL ) IVPB 500 mg        500 mg 100 mL/hr over 60 Minutes Intravenous  Once  12/26/23 1442 12/26/23 1617         Objective:   Vitals:   12/26/23 2041 12/26/23 2200 12/26/23 2339 12/27/23 0338  BP:  137/82 (!) 142/68 (!) 138/52  Pulse:  76 90 91  Resp:  19 11 19   Temp:  98.3 F (36.8 C) (!) 97.3 F (36.3 C) 97.6 F (36.4 C)  TempSrc:   Oral Axillary  SpO2:  (!) 88% 97% 90%  Weight: 111.9 kg       Wt Readings from Last 3 Encounters:  12/26/23 111.9 kg  11/18/23 104.3 kg  11/14/23 104.3 kg     Intake/Output Summary (Last 24 hours) at 12/27/2023 0814 Last data filed at 12/27/2023 1610 Gross per 24 hour  Intake 712.1 ml  Output 800 ml  Net -87.9 ml  Physical Exam  Awake, minimally confused no new F.N deficits, Foley catheter in place, right upper extremity slightly weak due to pain in the right shoulder and guarding,  [place].AT,PERRAL Supple Neck, No JVD,   Symmetrical Chest wall movement, Good air movement bilaterally, CTAB RRR,No Gallops,Rubs or new Murmurs,  +ve B.Sounds, Abd Soft, No tenderness,   No Cyanosis, Clubbing or edema     RN pressure injury documentation: Pressure Injury 06/10/22 Buttocks Left;Lower;Anterior Stage 2 -  Partial thickness loss of dermis presenting as a shallow open injury with a red, pink wound bed without slough. small size of a pea (Active)  06/10/22 1849  Location: Buttocks  Location Orientation: Left;Lower;Anterior  Staging: Stage 2 -  Partial thickness loss of dermis presenting as a shallow open injury with a red, pink wound bed without slough.  Wound Description (Comments): small size of a pea  Present on Admission: Yes      Data Review:    Recent Labs  Lab 12/26/23 1432 12/27/23 0547  WBC 10.9* 7.8  HGB 11.2* 10.8*  HCT 34.4* 33.6*  PLT 141* 110*  MCV 100.3* 99.7  MCH 32.7 32.0  MCHC 32.6 32.1  RDW 14.4 14.4  LYMPHSABS 1.2 1.2  MONOABS 1.4* 0.7  EOSABS 0.0 0.1  BASOSABS 0.0 0.0    Recent Labs  Lab 12/26/23 1432 12/26/23 1438 12/26/23 1711 12/26/23 2136 12/27/23 0547  NA 136  --    --   --  137  K 3.1*  --   --   --  3.4*  CL 100  --   --   --  102  CO2 25  --   --   --  25  ANIONGAP 11  --   --   --  10  GLUCOSE 139*  --   --   --  104*  BUN 10  --   --   --  10  CREATININE 0.71  --   --   --  0.77  AST 19  --   --   --  15  ALT 10  --   --   --  9  ALKPHOS 75  --   --   --  67  BILITOT 5.1*  --   --   --  3.8*  ALBUMIN 3.0*  --   --   --  2.6*  LATICACIDVEN  --  2.4* 3.5* 1.3  --   INR 2.2*  --   --   --   --   BNP  --   --   --   --  396.8*  MG 1.8  --   --   --  2.0  PHOS  --   --   --   --  2.4*  CALCIUM  8.9  --   --   --  8.6*      Recent Labs  Lab 12/26/23 1432 12/26/23 1438 12/26/23 1711 12/26/23 2136 12/27/23 0547  LATICACIDVEN  --  2.4* 3.5* 1.3  --   INR 2.2*  --   --   --   --   BNP  --   --   --   --  396.8*  MG 1.8  --   --   --  2.0  CALCIUM  8.9  --   --   --  8.6*    --------------------------------------------------------------------------------------------------------------- Lab Results  Component Value Date   CHOL 111 09/16/2023   HDL 40 (L) 09/16/2023   LDLCALC  49 09/16/2023   TRIG 109 09/16/2023   CHOLHDL 2.8 09/16/2023    Lab Results  Component Value Date   HGBA1C 5.9 (H) 11/20/2023   No results for input(s): "TSH", "T4TOTAL", "FREET4", "T3FREE", "THYROIDAB" in the last 72 hours. No results for input(s): "VITAMINB12", "FOLATE", "FERRITIN", "TIBC", "IRON", "RETICCTPCT" in the last 72 hours. ------------------------------------------------------------------------------------------------------------------ Cardiac Enzymes No results for input(s): "CKMB", "TROPONINI", "MYOGLOBIN" in the last 168 hours.  Invalid input(s): "CK"  Micro Results Recent Results (from the past 240 hours)  Blood Culture (routine x 2)     Status: None (Preliminary result)   Collection Time: 12/26/23  2:30 PM   Specimen: BLOOD RIGHT ARM  Result Value Ref Range Status   Specimen Description BLOOD RIGHT ARM  Final   Special Requests   Final     BOTTLES DRAWN AEROBIC AND ANAEROBIC Blood Culture adequate volume   Culture   Final    NO GROWTH < 24 HOURS Performed at [facility], [address] [place], [place] [address]    Report Status PENDING  Incomplete  Resp panel by RT-PCR (RSV, Flu A&B, Covid) Anterior Nasal Swab     Status: None   Collection Time: 12/26/23  2:32 PM   Specimen: Anterior Nasal Swab  Result Value Ref Range Status   SARS Coronavirus 2 by RT PCR NEGATIVE NEGATIVE Final   Influenza A by PCR NEGATIVE NEGATIVE Final   Influenza B by PCR NEGATIVE NEGATIVE Final    Comment: (NOTE) The Xpert Xpress SARS-CoV-2/FLU/RSV plus assay is intended as an aid in the diagnosis of influenza from Nasopharyngeal swab specimens and should not be used as a sole basis for treatment. Nasal washings and aspirates are unacceptable for Xpert Xpress SARS-CoV-2/FLU/RSV testing.  Fact Sheet for Patients: BloggerCourse.com  Fact Sheet for Healthcare Providers: SeriousBroker.it  This test is not yet approved or cleared by the [place]  FDA and has been authorized for detection and/or diagnosis of SARS-CoV-2 by FDA under an Emergency Use Authorization (EUA). This EUA will remain in effect (meaning this test can be used) for the duration of the COVID-19 declaration under Section 564(b)(1) of the Act, 21 U.S.C. section 360bbb-3(b)(1), unless the authorization is terminated or revoked.     Resp Syncytial Virus by PCR NEGATIVE NEGATIVE Final    Comment: (NOTE) Fact Sheet for Patients: BloggerCourse.com  Fact Sheet for Healthcare Providers: SeriousBroker.it  This test is not yet approved or cleared by the [place]  FDA and has been authorized for detection and/or diagnosis of SARS-CoV-2 by FDA under an Emergency Use Authorization (EUA). This EUA will remain in effect (meaning this test can be used) for the duration  of the COVID-19 declaration under Section 564(b)(1) of the Act, 21 U.S.C. section 360bbb-3(b)(1), unless the authorization is terminated or revoked.  Performed at [facility], [address] [place], [place] [address]   Blood Culture (routine x 2)     Status: None (Preliminary result)   Collection Time: 12/26/23  2:44 PM   Specimen: BLOOD RIGHT ARM  Result Value Ref Range Status   Specimen Description BLOOD RIGHT ARM  Final   Special Requests   Final    BOTTLES DRAWN AEROBIC AND ANAEROBIC Blood Culture adequate volume   Culture   Final    NO GROWTH < 24 HOURS Performed at [facility], [address] [place], [place] [address]    Report Status PENDING  Incomplete  MRSA Next Gen by PCR, Nasal  Status: Abnormal   Collection Time: 12/27/23  4:30 AM   Specimen: Nasal Mucosa; Nasal Swab  Result Value Ref Range Status   MRSA by PCR Next Gen DETECTED (A) NOT DETECTED Final    Comment: RESULT CALLED TO, READ BACK BY AND VERIFIED WITH: P BURTON,RN@[id number]  12/27/23 MK (NOTE) The GeneXpert MRSA Assay (FDA approved for NASAL specimens only), is one component of a comprehensive MRSA colonization surveillance program. It is not intended to diagnose MRSA infection nor to guide or monitor treatment for MRSA infections. Test performance is not FDA approved in patients less than 59 years old. Performed at [facility], [address], [place], [place] [address]     Radiology Report DG Chest Port 1 View Result Date: 12/27/2023 CLINICAL DATA:  Shortness of breath. EXAM: PORTABLE CHEST 1 VIEW COMPARISON:  12/26/2023 FINDINGS: The cardio pericardial silhouette is enlarged. Diffuse interstitial and basilar airspace disease, left greater than right, is similar to prior. Left-sided permanent pacemaker again noted. No acute bony abnormality. Telemetry leads overlie the chest. IMPRESSION: Diffuse interstitial and basilar airspace disease, left greater than right, similar to prior.  Imaging features could be related to interstitial edema with basilar atelectasis or infiltrate. Electronically Signed   By: Donnal Fusi M.D.   On: 12/27/2023 06:57   US  Abdomen Limited RUQ (LIVER/GB) Result Date: 12/26/2023 CLINICAL DATA:  Sepsis. EXAM: ULTRASOUND ABDOMEN LIMITED RIGHT UPPER QUADRANT COMPARISON:  CT 12/26/2023. FINDINGS: Gallbladder: Dilated gallbladder. Dependent stone. No wall thickening or adjacent fluid. No reported sonographic Murphy's sign. As per the sonographer the patient was unable to undergo left lateral decubitus imaging at this time due to clinical condition. Common bile duct: Diameter: 4 mm Liver: No focal lesion identified. Within normal limits in parenchymal echogenicity. Portal vein is patent on color Doppler imaging with normal direction of blood flow towards the liver. Other: Right-sided pleural effusion. IMPRESSION: Distended gallbladder with a stone. No further sonographic evidence of acute cholecystitis at this time. No ductal dilatation. Right-sided pleural effusion Electronically Signed   By: Adrianna Horde M.D.   On: 12/26/2023 18:13   CT ABDOMEN PELVIS W CONTRAST Result Date: 12/26/2023 CLINICAL DATA:  Abdominal pain EXAM: CT ABDOMEN AND PELVIS WITH CONTRAST TECHNIQUE: Multidetector CT imaging of the abdomen and pelvis was performed using the standard protocol following bolus administration of intravenous contrast. RADIATION DOSE REDUCTION: This exam was performed according to the departmental dose-optimization program which includes automated exposure control, adjustment of the mA and/or kV according to patient size and/or use of iterative reconstruction technique. CONTRAST:  75mL OMNIPAQUE  IOHEXOL  350 MG/ML SOLN COMPARISON:  06/10/2022 FINDINGS: Lower chest: Cardiomegaly. Pacer wires in the right heart. Coronary artery and aortic atherosclerosis. Small bilateral pleural effusions. Dependent atelectasis in the lower lobes. Hepatobiliary: No focal hepatic abnormality.  Small layering gallstone within the gallbladder. No biliary ductal dilatation. Pancreas: No focal abnormality or ductal dilatation. Spleen: No focal abnormality.  Normal size. Adrenals/Urinary Tract: Bilateral renal cysts are stable and appear benign. No follow-up imaging recommended. No stones or hydronephrosis. Adrenal glands and urinary bladder unremarkable. Urinary bladder decompressed with Foley catheter in place. Stomach/Bowel: Stomach, large and small bowel grossly unremarkable. Vascular/Lymphatic: Aortoiliac atherosclerosis. No evidence of aneurysm or adenopathy. Reproductive: No visible focal abnormality. Other: No free fluid or free air. Musculoskeletal: No acute bony abnormality. IMPRESSION: Cardiomegaly, coronary artery disease.  Aortic atherosclerosis. Small bilateral effusions with compressive atelectasis in the lower lobes. Cholelithiasis.  No evidence for acute cholecystitis. No acute findings in the abdomen or pelvis.  Electronically Signed   By: Janeece Mechanic M.D.   On: 12/26/2023 17:17   CT Head Wo Contrast Result Date: 12/26/2023 CLINICAL DATA:  Mental status changes EXAM: CT HEAD WITHOUT CONTRAST TECHNIQUE: Contiguous axial images were obtained from the base of the skull through the vertex without intravenous contrast. RADIATION DOSE REDUCTION: This exam was performed according to the departmental dose-optimization program which includes automated exposure control, adjustment of the mA and/or kV according to patient size and/or use of iterative reconstruction technique. COMPARISON:  None Available. FINDINGS: Brain: There is atrophy and chronic small vessel disease changes. No acute intracranial abnormality. Specifically, no hemorrhage, hydrocephalus, mass lesion, acute infarction, or significant intracranial injury. Vascular: No hyperdense vessel or unexpected calcification. Skull: No acute calvarial abnormality. Sinuses/Orbits: No acute findings Other: None IMPRESSION: Atrophy, chronic  microvascular disease. No acute intracranial abnormality. Electronically Signed   By: Janeece Mechanic M.D.   On: 12/26/2023 17:13   DG Hand Complete Left Result Date: 12/26/2023 CLINICAL DATA:  Trauma to the left upper extremity.  Pain. EXAM: LEFT HAND - COMPLETE 3+ VIEW; LEFT ELBOW - COMPLETE 3+ VIEW; LEFT FOREARM - 2 VIEW COMPARISON:  None Available. FINDINGS: Lucency through the base of the distal phalanx of the thumb suboptimally evaluated due to positioning. This may be chronic however, an acute fracture is not excluded. Correlation with clinical exam recommended. Dedicated radiograph of the thumb may provide better evaluation if there is clinical concern for fracture. There is an angulated fracture of the proximal phalanx of the fourth digit with dorsal angulation of the distal fracture fragment. No other acute fracture. No dislocation. The bones are osteopenic. Soft tissue swelling of the wrist. No radiopaque foreign object or soft tissue gas. IMPRESSION: 1. Angulated fracture of the proximal phalanx of the fourth digit. 2. Possible nondisplaced fracture of the base of the distal phalanx of the thumb. Evaluation of the hand is limited due to suboptimal positioning. Electronically Signed   By: Angus Bark M.D.   On: 12/26/2023 15:52   DG Forearm Left Result Date: 12/26/2023 CLINICAL DATA:  Trauma to the left upper extremity.  Pain. EXAM: LEFT HAND - COMPLETE 3+ VIEW; LEFT ELBOW - COMPLETE 3+ VIEW; LEFT FOREARM - 2 VIEW COMPARISON:  None Available. FINDINGS: Lucency through the base of the distal phalanx of the thumb suboptimally evaluated due to positioning. This may be chronic however, an acute fracture is not excluded. Correlation with clinical exam recommended. Dedicated radiograph of the thumb may provide better evaluation if there is clinical concern for fracture. There is an angulated fracture of the proximal phalanx of the fourth digit with dorsal angulation of the distal fracture fragment. No  other acute fracture. No dislocation. The bones are osteopenic. Soft tissue swelling of the wrist. No radiopaque foreign object or soft tissue gas. IMPRESSION: 1. Angulated fracture of the proximal phalanx of the fourth digit. 2. Possible nondisplaced fracture of the base of the distal phalanx of the thumb. Evaluation of the hand is limited due to suboptimal positioning. Electronically Signed   By: Angus Bark M.D.   On: 12/26/2023 15:52   DG Elbow Complete Left Result Date: 12/26/2023 CLINICAL DATA:  Trauma to the left upper extremity.  Pain. EXAM: LEFT HAND - COMPLETE 3+ VIEW; LEFT ELBOW - COMPLETE 3+ VIEW; LEFT FOREARM - 2 VIEW COMPARISON:  None Available. FINDINGS: Lucency through the base of the distal phalanx of the thumb suboptimally evaluated due to positioning. This may be chronic however, an acute fracture is not  excluded. Correlation with clinical exam recommended. Dedicated radiograph of the thumb may provide better evaluation if there is clinical concern for fracture. There is an angulated fracture of the proximal phalanx of the fourth digit with dorsal angulation of the distal fracture fragment. No other acute fracture. No dislocation. The bones are osteopenic. Soft tissue swelling of the wrist. No radiopaque foreign object or soft tissue gas. IMPRESSION: 1. Angulated fracture of the proximal phalanx of the fourth digit. 2. Possible nondisplaced fracture of the base of the distal phalanx of the thumb. Evaluation of the hand is limited due to suboptimal positioning. Electronically Signed   By: Angus Bark M.D.   On: 12/26/2023 15:52   DG Chest Port 1 View Result Date: 12/26/2023 CLINICAL DATA:  Questionable sepsis, evaluate for abnormality. Altered mental status. EXAM: PORTABLE CHEST 1 VIEW COMPARISON:  Radiographs 06/10/2022 and 08/09/2019.  CT 06/10/2022. FINDINGS: 1446 hours. Two views submitted. Left subclavian pacemaker leads project over the right atrium and right ventricle. The  heart size and mediastinal contours are stable with aortic atherosclerosis. Increased vascular congestion with probable patchy atelectasis at both lung bases. No overt pulmonary edema, confluent airspace disease, pneumothorax or significant pleural effusion. No acute osseous findings are seen. There are degenerative changes throughout the spine. IMPRESSION: Increased vascular congestion with probable patchy atelectasis at both lung bases. No definite evidence of pneumonia. Electronically Signed   By: Elmon Hagedorn M.D.   On: 12/26/2023 15:00   CUP PACEART REMOTE DEVICE CHECK Result Date: 12/26/2023 PPM Scheduled remote reviewed. Normal device function.  Presenting rhythm:  AF/AFL/VS Permanent AF, not always good rate control, subtle increase in HR's per trends, Eliquis  per PA report Next remote 91 days. [place], CVRS    Signature  -   Lynnwood Sauer M.D on 12/27/2023 at 8:14 AM   -  To page go to [email or web address]

## 2023-12-27 NOTE — Consult Note (Signed)
 WOC Nurse Consult Note: Reason for Consult: sacral wound  Wound type: Stage 3 Pressure Injury Sacrum/bilateral buttocks  Pressure Injury POA: Yes Measurement: see nursing flowsheet  Wound bed: 80% red 20% yellow  Drainage (amount, consistency, odor) see nursing flowsheet  Periwound: mild erythema and peeling skin  Dressing procedure/placement/frequency:  Cleanse sacral/buttocks wound with NS, apply Medihoney to wound bed daily and cover with dry gauze and silicone foam or ABD pad whichever is preferred.    Will write for Gerhardt's Butt Cream to surrounding intact skin 2 times a day and prn soiling.   POC discussed with bedside nurse. WOc team will not follow. Re-consult if further needs arise.   Thank you,    Ronni Colace MSN, RN-BC, [employer] [phone]

## 2023-12-27 NOTE — Progress Notes (Signed)
 Positive MRSA nasal swab, protocol initiated. Pt's son would like to see if any diagnostic tests could be performed to address pt's neck pain. Per son, pt frequently complains of back pain but feels like the neck pain has worsened and maybe related to something acute. Dr. Zelda Hickman made aware.

## 2023-12-27 NOTE — Plan of Care (Signed)
?  Problem: Education: ?Goal: Knowledge of General Education information will improve ?Description: Including pain rating scale, medication(s)/side effects and non-pharmacologic comfort measures ?Outcome: Not Progressing ?  ?Problem: Health Behavior/Discharge Planning: ?Goal: Ability to manage health-related needs will improve ?Outcome: Not Progressing ?  ?Problem: Clinical Measurements: ?Goal: Ability to maintain clinical measurements within normal limits will improve ?Outcome: Progressing ?Goal: Will remain free from infection ?Outcome: Progressing ?  ?

## 2023-12-27 NOTE — Plan of Care (Signed)
  Problem: SLP Dysphagia Goals Goal: Misc Dysphagia Goal Flowsheets (Taken 12/27/2023 1100) Misc Dysphagia Goal: Pt will tolerate a Dys 3 diet and nectar thick liquids, as well as, sips of thin water via provale cup with no overt s/s of aspiration or concern for respiratory decline.

## 2023-12-27 NOTE — Progress Notes (Signed)
 Orthopedic Tech Progress Note Patient Details:  Dylan [name] Dec 22, 1925 [id number]  Ortho Devices Type of Ortho Device: Finger splint Ortho Device/Splint Location: left thumb Ortho Device/Splint Interventions: Ordered, Application, Adjustment   Post Interventions Patient Tolerated: Well Instructions Provided: Adjustment of device  Sanav Remer OTR/L 12/27/2023, 10:11 AM

## 2023-12-27 NOTE — Consult Note (Signed)
 Reason for Consult:Left ring prox phalanx fx, thumb fx Referring Physician: Lynnwood Sauer Time called: 9147 Time at bedside: 1052   Dylan [name] is an 89 y.o. male.  HPI: Dylan [name] has fallen multiple times and hit a wall at the SN[facility] where he [facility]. He was brought in for AMS and admitted. X-rays showed fxs of the left hand and hand surgery was consulted. He doesn't really c/o much hand pain. He has multiple joint c/o but x-rays have all been negative. He is RHD.  Past Medical History:  Diagnosis Date   COVID-19    Diabetes mellitus without complication (HCC)    Dyspnea    History of hiatal hernia    Hyperglycemia    Hyperlipidemia    Hypertension    Lung nodule    Myocardial infarction [facility])    Presence of permanent cardiac pacemaker     Past Surgical History:  Procedure Laterality Date   APPENDECTOMY  1956   BACK SURGERY  2008   CARDIAC CATHETERIZATION  1995   negative results   CARDIOVERSION N/A 08/05/2017   Procedure: CARDIOVERSION;  Surgeon: Darlis Eisenmenger, MD;  Location: [facility] ENDOSCOPY;  Service: Cardiovascular;  Laterality: N/A;   CARDIOVERSION N/A 09/12/2017   Procedure: CARDIOVERSION;  Surgeon: Darlis Eisenmenger, MD;  Location: [facility] ENDOSCOPY;  Service: Cardiovascular;  Laterality: N/A;   CARDIOVERSION N/A 09/15/2021   Procedure: CARDIOVERSION;  Surgeon: Darlis Eisenmenger, MD;  Location: [facility] ENDOSCOPY;  Service: Cardiovascular;  Laterality: N/A;   CARDIOVERSION N/A 10/12/2021   Procedure: CARDIOVERSION;  Surgeon: Darlis Eisenmenger, MD;  Location: [facility] ENDOSCOPY;  Service: Cardiovascular;  Laterality: N/A;   COLON SURGERY  07/09/12   HERNIA REPAIR  1952   HOT HEMOSTASIS  01/08/2012   Procedure: HOT HEMOSTASIS (ARGON PLASMA COAGULATION/BICAP);  Surgeon: Evangeline Hilts, MD;  Location: [facility] ENDOSCOPY;  Service: Endoscopy;  Laterality: N/A;   LAPAROSCOPIC ILEOCECECTOMY  07/09/2012   Procedure: LAPAROSCOPIC ILEOCECECTOMY;  Surgeon: Quitman Bucy, MD;  Location: [facility] ORS;  Service:  General;  Laterality: N/A;  Laparoscopic Ileocecectomy   PACEMAKER IMPLANT N/A 10/05/2017   Procedure: PACEMAKER IMPLANT;  Surgeon: Verona Goodwill, MD;  Location: [facility] INVASIVE CV LAB;  Service: Cardiovascular;  Laterality: N/A;   REPLACEMENT TOTAL KNEE  2010   TEE WITHOUT CARDIOVERSION N/A 08/05/2017   Procedure: TRANSESOPHAGEAL ECHOCARDIOGRAM (TEE);  Surgeon: Darlis Eisenmenger, MD;  Location: [facility] ENDOSCOPY;  Service: Cardiovascular;  Laterality: N/A;   TEE WITHOUT CARDIOVERSION N/A 09/12/2017   Procedure: TRANSESOPHAGEAL ECHOCARDIOGRAM (TEE);  Surgeon: Darlis Eisenmenger, MD;  Location: [facility] ENDOSCOPY;  Service: Cardiovascular;  Laterality: N/A;    Family History  Problem Relation Age of Onset   Cancer - Other Mother    CVA Father    Heart attack Brother    Heart attack Maternal Grandmother     Social History:  reports that he quit smoking about 31 years ago. His smoking use included cigarettes. He has never used smokeless tobacco. He reports that he does not currently use alcohol  after a past usage of about 2.0 standard drinks of alcohol  per week. He reports that he does not use drugs.  Allergies:  Allergies  Allergen Reactions   Keflex [Cephalexin] Hives and Other (See Comments)    Occurred in the 1980's Tolerated ceftriaxone  11/18/23    Medications: I have reviewed the patient's current medications.  Results for orders placed or performed during the hospital encounter of 12/26/23 (from the past 48 hours)  Blood Culture (routine x 2)  Status: None (Preliminary result)   Collection Time: 12/26/23  2:30 PM   Specimen: BLOOD RIGHT ARM  Result Value Ref Range   Specimen Description BLOOD RIGHT ARM    Special Requests      BOTTLES DRAWN AEROBIC AND ANAEROBIC Blood Culture adequate volume   Culture      NO GROWTH < 24 HOURS Performed at [facility], [address] [place], [place] [address]    Report Status PENDING   Resp panel by RT-PCR (RSV, Flu A&B, Covid) Anterior Nasal  Swab     Status: None   Collection Time: 12/26/23  2:32 PM   Specimen: Anterior Nasal Swab  Result Value Ref Range   SARS Coronavirus 2 by RT PCR NEGATIVE NEGATIVE   Influenza A by PCR NEGATIVE NEGATIVE   Influenza B by PCR NEGATIVE NEGATIVE    Comment: (NOTE) The Xpert Xpress SARS-CoV-2/FLU/RSV plus assay is intended as an aid in the diagnosis of influenza from Nasopharyngeal swab specimens and should not be used as a sole basis for treatment. Nasal washings and aspirates are unacceptable for Xpert Xpress SARS-CoV-2/FLU/RSV testing.  Fact Sheet for Patients: BloggerCourse.com  Fact Sheet for Healthcare Providers: SeriousBroker.it  This test is not yet approved or cleared by the [place]  FDA and has been authorized for detection and/or diagnosis of SARS-CoV-2 by FDA under an Emergency Use Authorization (EUA). This EUA will remain in effect (meaning this test can be used) for the duration of the COVID-19 declaration under Section 564(b)(1) of the Act, 21 U.S.C. section 360bbb-3(b)(1), unless the authorization is terminated or revoked.     Resp Syncytial Virus by PCR NEGATIVE NEGATIVE    Comment: (NOTE) Fact Sheet for Patients: BloggerCourse.com  Fact Sheet for Healthcare Providers: SeriousBroker.it  This test is not yet approved or cleared by the [place]  FDA and has been authorized for detection and/or diagnosis of SARS-CoV-2 by FDA under an Emergency Use Authorization (EUA). This EUA will remain in effect (meaning this test can be used) for the duration of the COVID-19 declaration under Section 564(b)(1) of the Act, 21 U.S.C. section 360bbb-3(b)(1), unless the authorization is terminated or revoked.  Performed at [facility], [address] [place], [place] [address]   Comprehensive metabolic panel     Status: Abnormal   Collection Time: 12/26/23  2:32  PM  Result Value Ref Range   Sodium 136 135 - 145 mmol/L   Potassium 3.1 (L) 3.5 - 5.1 mmol/L   Chloride 100 98 - 111 mmol/L   CO2 25 22 - 32 mmol/L   Glucose, Bld 139 (H) 70 - 99 mg/dL    Comment: Glucose reference range applies only to samples taken after fasting for at least 8 hours.   BUN 10 8 - 23 mg/dL   Creatinine, Ser 1.91 0.61 - 1.24 mg/dL   Calcium  8.9 8.9 - 10.3 mg/dL   Total Protein 6.3 (L) 6.5 - 8.1 g/dL   Albumin 3.0 (L) 3.5 - 5.0 g/dL   AST 19 15 - 41 U/L   ALT 10 0 - 44 U/L   Alkaline Phosphatase 75 38 - 126 U/L   Total Bilirubin 5.1 (H) 0.0 - 1.2 mg/dL   GFR, Estimated >47 >82 mL/min    Comment: (NOTE) Calculated using the CKD-EPI Creatinine Equation (2021)    Anion gap 11 5 - 15    Comment: Performed at [facility], [address], [place], [place] [address]  CBC with Differential  Status: Abnormal   Collection Time: 12/26/23  2:32 PM  Result Value Ref Range   WBC 10.9 (H) 4.0 - 10.5 K/uL   RBC 3.43 (L) 4.22 - 5.81 MIL/uL   Hemoglobin 11.2 (L) 13.0 - 17.0 g/dL   HCT 16.1 (L) 09.6 - 04.5 %   MCV 100.3 (H) 80.0 - 100.0 fL   MCH 32.7 26.0 - 34.0 pg   MCHC 32.6 30.0 - 36.0 g/dL   RDW 40.9 81.1 - 91.4 %   Platelets 141 (L) 150 - 400 K/uL   nRBC 0.0 0.0 - 0.2 %   Neutrophils Relative % 77 %   Neutro Abs 8.3 (H) 1.7 - 7.7 K/uL   Lymphocytes Relative 11 %   Lymphs Abs 1.2 0.7 - 4.0 K/uL   Monocytes Relative 12 %   Monocytes Absolute 1.4 (H) 0.1 - 1.0 K/uL   Eosinophils Relative 0 %   Eosinophils Absolute 0.0 0.0 - 0.5 K/uL   Basophils Relative 0 %   Basophils Absolute 0.0 0.0 - 0.1 K/uL   Immature Granulocytes 0 %   Abs Immature Granulocytes 0.02 0.00 - 0.07 K/uL    Comment: Performed at [facility], [address], [place], [place] [address]  Protime-INR     Status: Abnormal   Collection Time: 12/26/23  2:32 PM  Result Value Ref Range   Prothrombin Time 24.6 (H) 11.4 - 15.2 seconds   INR 2.2 (H) 0.8 - 1.2    Comment: (NOTE) INR goal  varies based on device and disease states. Performed at [facility], [address], [place], [place] [address]   Urinalysis, w/ Reflex to Culture (Infection Suspected) -Urine, Catheterized     Status: Abnormal   Collection Time: 12/26/23  2:32 PM  Result Value Ref Range   Specimen Source URINE, CATHETERIZED    Color, Urine AMBER (A) YELLOW    Comment: BIOCHEMICALS MAY BE AFFECTED BY COLOR   APPearance CLEAR CLEAR   Specific Gravity, Urine 1.020 1.005 - 1.030   pH 5.0 5.0 - 8.0   Glucose, UA NEGATIVE NEGATIVE mg/dL   Hgb urine dipstick NEGATIVE NEGATIVE   Bilirubin Urine NEGATIVE NEGATIVE   Ketones, ur NEGATIVE NEGATIVE mg/dL   Protein, ur 30 (A) NEGATIVE mg/dL   Nitrite NEGATIVE NEGATIVE   Leukocytes,Ua NEGATIVE NEGATIVE   RBC / HPF 0-5 0 - 5 RBC/hpf   WBC, UA 0-5 0 - 5 WBC/hpf    Comment:        Reflex urine culture not performed if WBC <=10, OR if Squamous epithelial cells >5. If Squamous epithelial cells >5 suggest recollection.    Bacteria, UA NONE SEEN NONE SEEN   Squamous Epithelial / HPF 0-5 0 - 5 /HPF   Mucus PRESENT    Hyaline Casts, UA PRESENT     Comment: Performed at [facility], [address] [place], [place] [address]  Magnesium      Status: None   Collection Time: 12/26/23  2:32 PM  Result Value Ref Range   Magnesium  1.8 1.7 - 2.4 mg/dL    Comment: Performed at [facility], [address] [place], [place] [address]  I-Stat Lactic Acid, ED     Status: Abnormal   Collection Time: 12/26/23  2:38 PM  Result Value Ref Range   Lactic Acid, Venous 2.4 (HH) 0.5 - 1.9 mmol/L   Comment NOTIFIED PHYSICIAN   Blood Culture (routine x 2)     Status: None (Preliminary result)  Collection Time: 12/26/23  2:44 PM   Specimen: BLOOD RIGHT ARM  Result Value Ref Range   Specimen Description BLOOD RIGHT ARM    Special Requests      BOTTLES DRAWN AEROBIC AND ANAEROBIC Blood Culture adequate volume   Culture      NO GROWTH < 24 HOURS Performed at  [facility], [address], [place], [place] [address]    Report Status PENDING   I-Stat Lactic Acid, ED     Status: Abnormal   Collection Time: 12/26/23  5:11 PM  Result Value Ref Range   Lactic Acid, Venous 3.5 (HH) 0.5 - 1.9 mmol/L   Comment NOTIFIED PHYSICIAN   Lactic acid, plasma     Status: None   Collection Time: 12/26/23  9:36 PM  Result Value Ref Range   Lactic Acid, Venous 1.3 0.5 - 1.9 mmol/L    Comment: Performed at [facility], [address], [place], [place] [address]  MRSA Next Gen by PCR, Nasal     Status: Abnormal   Collection Time: 12/27/23  4:30 AM   Specimen: Nasal Mucosa; Nasal Swab  Result Value Ref Range   MRSA by PCR Next Gen DETECTED (A) NOT DETECTED    Comment: RESULT CALLED TO, READ BACK BY AND VERIFIED WITH: P BURTON,RN@[email or web address]  12/27/23 MK (NOTE) The GeneXpert MRSA Assay (FDA approved for NASAL specimens only), is one component of a comprehensive MRSA colonization surveillance program. It is not intended to diagnose MRSA infection nor to guide or monitor treatment for MRSA infections. Test performance is not FDA approved in patients less than 108 years old. Performed at [facility], [address], [place], [place] [address]   Magnesium      Status: None   Collection Time: 12/27/23  5:47 AM  Result Value Ref Range   Magnesium  2.0 1.7 - 2.4 mg/dL    Comment: Performed at [facility], [address] [place], [place] [address]  Phosphorus     Status: Abnormal   Collection Time: 12/27/23  5:47 AM  Result Value Ref Range   Phosphorus 2.4 (L) 2.5 - 4.6 mg/dL    Comment: Performed at [facility], [address], [place], [place] [address]  Comprehensive metabolic panel     Status: Abnormal   Collection Time: 12/27/23  5:47 AM  Result Value Ref Range   Sodium 137 135 - 145 mmol/L   Potassium 3.4 (L) 3.5 - 5.1 mmol/L   Chloride 102 98 - 111 mmol/L   CO2 25 22 - 32 mmol/L   Glucose, Bld 104 (H) 70 - 99 mg/dL    Comment:  Glucose reference range applies only to samples taken after fasting for at least 8 hours.   BUN 10 8 - 23 mg/dL   Creatinine, Ser 8.46 0.61 - 1.24 mg/dL   Calcium  8.6 (L) 8.9 - 10.3 mg/dL   Total Protein 5.6 (L) 6.5 - 8.1 g/dL   Albumin 2.6 (L) 3.5 - 5.0 g/dL   AST 15 15 - 41 U/L   ALT 9 0 - 44 U/L   Alkaline Phosphatase 67 38 - 126 U/L   Total Bilirubin 3.8 (H) 0.0 - 1.2 mg/dL   GFR, Estimated >96 >29 mL/min    Comment: (NOTE) Calculated using the CKD-EPI Creatinine Equation (2021)    Anion gap 10 5 - 15    Comment: Performed at [facility], [address] [place], [place] [address]  CBC  with Differential/Platelet     Status: Abnormal   Collection Time: 12/27/23  5:47 AM  Result Value Ref Range   WBC 7.8 4.0 - 10.5 K/uL   RBC 3.37 (L) 4.22 - 5.81 MIL/uL   Hemoglobin 10.8 (L) 13.0 - 17.0 g/dL   HCT 40.9 (L) 81.1 - 91.4 %   MCV 99.7 80.0 - 100.0 fL   MCH 32.0 26.0 - 34.0 pg   MCHC 32.1 30.0 - 36.0 g/dL   RDW 78.2 95.6 - 21.3 %   Platelets 110 (L) 150 - 400 K/uL   nRBC 0.4 (H) 0.0 - 0.2 %   Neutrophils Relative % 74 %   Neutro Abs 5.8 1.7 - 7.7 K/uL   Lymphocytes Relative 16 %   Lymphs Abs 1.2 0.7 - 4.0 K/uL   Monocytes Relative 9 %   Monocytes Absolute 0.7 0.1 - 1.0 K/uL   Eosinophils Relative 1 %   Eosinophils Absolute 0.1 0.0 - 0.5 K/uL   Basophils Relative 0 %   Basophils Absolute 0.0 0.0 - 0.1 K/uL   Immature Granulocytes 0 %   Abs Immature Granulocytes 0.02 0.00 - 0.07 K/uL    Comment: Performed at [facility], [address] [place], [place] [address]  Procalcitonin     Status: None   Collection Time: 12/27/23  5:47 AM  Result Value Ref Range   Procalcitonin 0.10 ng/mL    Comment:        Interpretation: PCT (Procalcitonin) <= 0.5 ng/mL: Systemic infection (sepsis) is not likely. Local bacterial infection is possible. (NOTE)       Sepsis PCT Algorithm           Lower Respiratory Tract                                      Infection PCT  Algorithm    ----------------------------     ----------------------------         PCT < 0.25 ng/mL                PCT < 0.10 ng/mL          Strongly encourage             Strongly discourage   discontinuation of antibiotics    initiation of antibiotics    ----------------------------     -----------------------------       PCT 0.25 - 0.50 ng/mL            PCT 0.10 - 0.25 ng/mL               OR       >80% decrease in PCT            Discourage initiation of                                            antibiotics      Encourage discontinuation           of antibiotics    ----------------------------     -----------------------------         PCT >= 0.50 ng/mL              PCT 0.26 - 0.50 ng/mL  AND        <80% decrease in PCT             Encourage initiation of                                             antibiotics       Encourage continuation           of antibiotics    ----------------------------     -----------------------------        PCT >= 0.50 ng/mL                  PCT > 0.50 ng/mL               AND         increase in PCT                  Strongly encourage                                      initiation of antibiotics    Strongly encourage escalation           of antibiotics                                     -----------------------------                                           PCT <= 0.25 ng/mL                                                 OR                                        > 80% decrease in PCT                                      Discontinue / Do not initiate                                             antibiotics  Performed at [facility], [address], [place], [place] [address]   C-reactive protein     Status: Abnormal   Collection Time: 12/27/23  5:47 AM  Result Value Ref Range   CRP 20.0 (H) <1.0 mg/dL    Comment: Performed at [facility], [address] [place], [place] [address]  Brain natriuretic peptide      Status: Abnormal   Collection Time: 12/27/23  5:47 AM  Result Value Ref Range   B Natriuretic Peptide 396.8 (H) 0.0 - 100.0 pg/mL    Comment: Performed  at [facility], [address] [place], [place] [address]    DG Shoulder Right Result Date: 12/27/2023 CLINICAL DATA:  Right shoulder pain. EXAM: RIGHT SHOULDER - 2+ VIEW COMPARISON:  November 18, 2023. FINDINGS: There is no evidence of fracture or dislocation. Severe narrowing of glenohumeral joint is noted. Soft tissues are unremarkable. IMPRESSION: Severe osteoarthritis of right glenohumeral joint. No acute abnormality seen. Electronically Signed   By: Rosalene Colon M.D.   On: 12/27/2023 10:42   DG HIP UNILAT WITH PELVIS 2-3 VIEWS LEFT Result Date: 12/27/2023 CLINICAL DATA:  Left hip pain. EXAM: DG HIP (WITH OR WITHOUT PELVIS) 2-3V LEFT COMPARISON:  None Available. FINDINGS: There is no evidence of hip fracture or dislocation. Mild osteophyte formation of left hip is noted. IMPRESSION: Mild degenerative joint disease of left hip. No acute abnormality seen. Electronically Signed   By: Rosalene Colon M.D.   On: 12/27/2023 10:40   DG Chest Port 1 View Result Date: 12/27/2023 CLINICAL DATA:  Shortness of breath. EXAM: PORTABLE CHEST 1 VIEW COMPARISON:  12/26/2023 FINDINGS: The cardio pericardial silhouette is enlarged. Diffuse interstitial and basilar airspace disease, left greater than right, is similar to prior. Left-sided permanent pacemaker again noted. No acute bony abnormality. Telemetry leads overlie the chest. IMPRESSION: Diffuse interstitial and basilar airspace disease, left greater than right, similar to prior. Imaging features could be related to interstitial edema with basilar atelectasis or infiltrate. Electronically Signed   By: Donnal Fusi M.D.   On: 12/27/2023 06:57   US  Abdomen Limited RUQ (LIVER/GB) Result Date: 12/26/2023 CLINICAL DATA:  Sepsis. EXAM: ULTRASOUND ABDOMEN LIMITED RIGHT UPPER QUADRANT COMPARISON:  CT 12/26/2023.  FINDINGS: Gallbladder: Dilated gallbladder. Dependent stone. No wall thickening or adjacent fluid. No reported sonographic Murphy's sign. As per the sonographer the patient was unable to undergo left lateral decubitus imaging at this time due to clinical condition. Common bile duct: Diameter: 4 mm Liver: No focal lesion identified. Within normal limits in parenchymal echogenicity. Portal vein is patent on color Doppler imaging with normal direction of blood flow towards the liver. Other: Right-sided pleural effusion. IMPRESSION: Distended gallbladder with a stone. No further sonographic evidence of acute cholecystitis at this time. No ductal dilatation. Right-sided pleural effusion Electronically Signed   By: Adrianna Horde M.D.   On: 12/26/2023 18:13   CT ABDOMEN PELVIS W CONTRAST Result Date: 12/26/2023 CLINICAL DATA:  Abdominal pain EXAM: CT ABDOMEN AND PELVIS WITH CONTRAST TECHNIQUE: Multidetector CT imaging of the abdomen and pelvis was performed using the standard protocol following bolus administration of intravenous contrast. RADIATION DOSE REDUCTION: This exam was performed according to the departmental dose-optimization program which includes automated exposure control, adjustment of the mA and/or kV according to patient size and/or use of iterative reconstruction technique. CONTRAST:  75mL OMNIPAQUE  IOHEXOL  350 MG/ML SOLN COMPARISON:  06/10/2022 FINDINGS: Lower chest: Cardiomegaly. Pacer wires in the right heart. Coronary artery and aortic atherosclerosis. Small bilateral pleural effusions. Dependent atelectasis in the lower lobes. Hepatobiliary: No focal hepatic abnormality. Small layering gallstone within the gallbladder. No biliary ductal dilatation. Pancreas: No focal abnormality or ductal dilatation. Spleen: No focal abnormality.  Normal size. Adrenals/Urinary Tract: Bilateral renal cysts are stable and appear benign. No follow-up imaging recommended. No stones or hydronephrosis. Adrenal glands and  urinary bladder unremarkable. Urinary bladder decompressed with Foley catheter in place. Stomach/Bowel: Stomach, large and small bowel grossly unremarkable. Vascular/Lymphatic: Aortoiliac atherosclerosis. No evidence of aneurysm or adenopathy. Reproductive: No visible focal abnormality. Other: No free fluid  or free air. Musculoskeletal: No acute bony abnormality. IMPRESSION: Cardiomegaly, coronary artery disease.  Aortic atherosclerosis. Small bilateral effusions with compressive atelectasis in the lower lobes. Cholelithiasis.  No evidence for acute cholecystitis. No acute findings in the abdomen or pelvis. Electronically Signed   By: Janeece Mechanic M.D.   On: 12/26/2023 17:17   CT Head Wo Contrast Result Date: 12/26/2023 CLINICAL DATA:  Mental status changes EXAM: CT HEAD WITHOUT CONTRAST TECHNIQUE: Contiguous axial images were obtained from the base of the skull through the vertex without intravenous contrast. RADIATION DOSE REDUCTION: This exam was performed according to the departmental dose-optimization program which includes automated exposure control, adjustment of the mA and/or kV according to patient size and/or use of iterative reconstruction technique. COMPARISON:  None Available. FINDINGS: Brain: There is atrophy and chronic small vessel disease changes. No acute intracranial abnormality. Specifically, no hemorrhage, hydrocephalus, mass lesion, acute infarction, or significant intracranial injury. Vascular: No hyperdense vessel or unexpected calcification. Skull: No acute calvarial abnormality. Sinuses/Orbits: No acute findings Other: None IMPRESSION: Atrophy, chronic microvascular disease. No acute intracranial abnormality. Electronically Signed   By: Janeece Mechanic M.D.   On: 12/26/2023 17:13   DG Hand Complete Left Result Date: 12/26/2023 CLINICAL DATA:  Trauma to the left upper extremity.  Pain. EXAM: LEFT HAND - COMPLETE 3+ VIEW; LEFT ELBOW - COMPLETE 3+ VIEW; LEFT FOREARM - 2 VIEW COMPARISON:  None  Available. FINDINGS: Lucency through the base of the distal phalanx of the thumb suboptimally evaluated due to positioning. This may be chronic however, an acute fracture is not excluded. Correlation with clinical exam recommended. Dedicated radiograph of the thumb may provide better evaluation if there is clinical concern for fracture. There is an angulated fracture of the proximal phalanx of the fourth digit with dorsal angulation of the distal fracture fragment. No other acute fracture. No dislocation. The bones are osteopenic. Soft tissue swelling of the wrist. No radiopaque foreign object or soft tissue gas. IMPRESSION: 1. Angulated fracture of the proximal phalanx of the fourth digit. 2. Possible nondisplaced fracture of the base of the distal phalanx of the thumb. Evaluation of the hand is limited due to suboptimal positioning. Electronically Signed   By: Angus Bark M.D.   On: 12/26/2023 15:52   DG Forearm Left Result Date: 12/26/2023 CLINICAL DATA:  Trauma to the left upper extremity.  Pain. EXAM: LEFT HAND - COMPLETE 3+ VIEW; LEFT ELBOW - COMPLETE 3+ VIEW; LEFT FOREARM - 2 VIEW COMPARISON:  None Available. FINDINGS: Lucency through the base of the distal phalanx of the thumb suboptimally evaluated due to positioning. This may be chronic however, an acute fracture is not excluded. Correlation with clinical exam recommended. Dedicated radiograph of the thumb may provide better evaluation if there is clinical concern for fracture. There is an angulated fracture of the proximal phalanx of the fourth digit with dorsal angulation of the distal fracture fragment. No other acute fracture. No dislocation. The bones are osteopenic. Soft tissue swelling of the wrist. No radiopaque foreign object or soft tissue gas. IMPRESSION: 1. Angulated fracture of the proximal phalanx of the fourth digit. 2. Possible nondisplaced fracture of the base of the distal phalanx of the thumb. Evaluation of the hand is limited due  to suboptimal positioning. Electronically Signed   By: Angus Bark M.D.   On: 12/26/2023 15:52   DG Elbow Complete Left Result Date: 12/26/2023 CLINICAL DATA:  Trauma to the left upper extremity.  Pain. EXAM: LEFT HAND - COMPLETE 3+ VIEW; LEFT  ELBOW - COMPLETE 3+ VIEW; LEFT FOREARM - 2 VIEW COMPARISON:  None Available. FINDINGS: Lucency through the base of the distal phalanx of the thumb suboptimally evaluated due to positioning. This may be chronic however, an acute fracture is not excluded. Correlation with clinical exam recommended. Dedicated radiograph of the thumb may provide better evaluation if there is clinical concern for fracture. There is an angulated fracture of the proximal phalanx of the fourth digit with dorsal angulation of the distal fracture fragment. No other acute fracture. No dislocation. The bones are osteopenic. Soft tissue swelling of the wrist. No radiopaque foreign object or soft tissue gas. IMPRESSION: 1. Angulated fracture of the proximal phalanx of the fourth digit. 2. Possible nondisplaced fracture of the base of the distal phalanx of the thumb. Evaluation of the hand is limited due to suboptimal positioning. Electronically Signed   By: Angus Bark M.D.   On: 12/26/2023 15:52   DG Chest Port 1 View Result Date: 12/26/2023 CLINICAL DATA:  Questionable sepsis, evaluate for abnormality. Altered mental status. EXAM: PORTABLE CHEST 1 VIEW COMPARISON:  Radiographs 06/10/2022 and 08/09/2019.  CT 06/10/2022. FINDINGS: 1446 hours. Two views submitted. Left subclavian pacemaker leads project over the right atrium and right ventricle. The heart size and mediastinal contours are stable with aortic atherosclerosis. Increased vascular congestion with probable patchy atelectasis at both lung bases. No overt pulmonary edema, confluent airspace disease, pneumothorax or significant pleural effusion. No acute osseous findings are seen. There are degenerative changes throughout the spine.  IMPRESSION: Increased vascular congestion with probable patchy atelectasis at both lung bases. No definite evidence of pneumonia. Electronically Signed   By: Elmon Hagedorn M.D.   On: 12/26/2023 15:00   CUP PACEART REMOTE DEVICE CHECK Result Date: 12/26/2023 PPM Scheduled remote reviewed. Normal device function.  Presenting rhythm:  AF/AFL/VS Permanent AF, not always good rate control, subtle increase in HR's per trends, Eliquis  per PA report Next remote 91 days. LA, CVRS   Review of Systems  HENT:  Negative for ear discharge, ear pain, hearing loss and tinnitus.   Eyes:  Negative for photophobia and pain.  Respiratory:  Negative for cough and shortness of breath.   Cardiovascular:  Negative for chest pain.  Gastrointestinal:  Negative for abdominal pain, nausea and vomiting.  Genitourinary:  Negative for dysuria, flank pain, frequency and urgency.  Musculoskeletal:  Positive for arthralgias (Left hand). Negative for back pain, myalgias and neck pain.  Neurological:  Negative for dizziness and headaches.  Hematological:  Does not bruise/bleed easily.  Psychiatric/Behavioral:  The patient is not nervous/anxious.    Blood pressure 129/89, pulse 91, temperature 97.6 F (36.4 C), temperature source Axillary, resp. rate 19, weight 111.9 kg, SpO2 90%. Physical Exam Constitutional:      General: He is not in acute distress.    Appearance: He is well-developed. He is not diaphoretic.  HENT:     Head: Normocephalic and atraumatic.  Eyes:     General: No scleral icterus.       Right eye: No discharge.        Left eye: No discharge.     Conjunctiva/sclera: Conjunctivae normal.  Cardiovascular:     Rate and Rhythm: Normal rate and regular rhythm.  Pulmonary:     Effort: Pulmonary effort is normal. No respiratory distress.  Musculoskeletal:     Cervical back: Normal range of motion.     Comments: Left shoulder, elbow, wrist, digits- no skin wounds, nontender, no instability, no blocks to  motion, hand  quite ecchymotic, Al splint on thumb  Sens  Ax/R/M/U intact  Mot   Ax/ R/ PIN/ M/ AIN/ U grossly intact  Rad 2+  Skin:    General: Skin is warm and dry.  Neurological:     Mental Status: He is alert.  Psychiatric:        Mood and Affect: Mood normal.        Behavior: Behavior normal.     Assessment/Plan: Left 4th prox phalanx fx -- Will place in ulnar gutter. F/u with Dr. Glenora Laos in 1-2 weeks. Left thumb distal phalanx fx -- Al splint is fine for this    Georganna Kin, PA-C Orthopedic Surgery [phone] 12/27/2023, 11:03 AM

## 2023-12-28 ENCOUNTER — Inpatient Hospital Stay (HOSPITAL_COMMUNITY)

## 2023-12-28 DIAGNOSIS — R6510 Systemic inflammatory response syndrome (SIRS) of non-infectious origin without acute organ dysfunction: Secondary | ICD-10-CM | POA: Diagnosis not present

## 2023-12-28 DIAGNOSIS — E876 Hypokalemia: Secondary | ICD-10-CM | POA: Diagnosis not present

## 2023-12-28 DIAGNOSIS — I480 Paroxysmal atrial fibrillation: Secondary | ICD-10-CM | POA: Diagnosis not present

## 2023-12-28 DIAGNOSIS — I5043 Acute on chronic combined systolic (congestive) and diastolic (congestive) heart failure: Secondary | ICD-10-CM | POA: Diagnosis not present

## 2023-12-28 LAB — CBC WITH DIFFERENTIAL/PLATELET
Abs Immature Granulocytes: 0.01 10*3/uL (ref 0.00–0.07)
Basophils Absolute: 0.1 10*3/uL (ref 0.0–0.1)
Basophils Relative: 1 %
Eosinophils Absolute: 0.1 10*3/uL (ref 0.0–0.5)
Eosinophils Relative: 2 %
HCT: 32 % — ABNORMAL LOW (ref 39.0–52.0)
Hemoglobin: 10.3 g/dL — ABNORMAL LOW (ref 13.0–17.0)
Immature Granulocytes: 0 %
Lymphocytes Relative: 16 %
Lymphs Abs: 1 10*3/uL (ref 0.7–4.0)
MCH: 32.1 pg (ref 26.0–34.0)
MCHC: 32.2 g/dL (ref 30.0–36.0)
MCV: 99.7 fL (ref 80.0–100.0)
Monocytes Absolute: 0.6 10*3/uL (ref 0.1–1.0)
Monocytes Relative: 9 %
Neutro Abs: 4.5 10*3/uL (ref 1.7–7.7)
Neutrophils Relative %: 72 %
Platelets: 128 10*3/uL — ABNORMAL LOW (ref 150–400)
RBC: 3.21 MIL/uL — ABNORMAL LOW (ref 4.22–5.81)
RDW: 14.2 % (ref 11.5–15.5)
WBC: 6.2 10*3/uL (ref 4.0–10.5)
nRBC: 0 % (ref 0.0–0.2)

## 2023-12-28 LAB — BASIC METABOLIC PANEL WITH GFR
Anion gap: 10 (ref 5–15)
BUN: 13 mg/dL (ref 8–23)
CO2: 26 mmol/L (ref 22–32)
Calcium: 8.2 mg/dL — ABNORMAL LOW (ref 8.9–10.3)
Chloride: 101 mmol/L (ref 98–111)
Creatinine, Ser: 0.8 mg/dL (ref 0.61–1.24)
GFR, Estimated: >60 mL/min (ref >60)
Glucose, Bld: 103 mg/dL — ABNORMAL HIGH (ref 70–99)
Potassium: 3.6 mmol/L (ref 3.5–5.1)
Sodium: 137 mmol/L (ref 135–145)

## 2023-12-28 LAB — MAGNESIUM: Magnesium: 1.9 mg/dL (ref 1.7–2.4)

## 2023-12-28 LAB — BRAIN NATRIURETIC PEPTIDE: B Natriuretic Peptide: 513 pg/mL — ABNORMAL HIGH (ref 0.0–100.0)

## 2023-12-28 LAB — C-REACTIVE PROTEIN: CRP: 15.3 mg/dL — ABNORMAL HIGH (ref <1.0)

## 2023-12-28 LAB — PROCALCITONIN: Procalcitonin: 0.1 ng/mL

## 2023-12-28 MED ORDER — TECHNETIUM TC 99M MEBROFENIN IV KIT
5.0000 | PACK | Freq: Once | INTRAVENOUS | Status: AC | PRN
  Administered 2023-12-28: 5 via INTRAVENOUS

## 2023-12-28 MED ORDER — METHYLPREDNISOLONE SODIUM SUCC 40 MG IJ SOLR
40.0000 mg | Freq: Once | INTRAMUSCULAR | Status: AC
  Administered 2023-12-28: 40 mg via INTRAVENOUS
  Filled 2023-12-28: qty 1

## 2023-12-28 NOTE — Progress Notes (Signed)
 PROGRESS NOTE                                                                                                                                                                                                             Patient Demographics:    Dylan [name], is a 89 y.o. male, DOB - 19-Sep-1925, ZOX:[id number]  Outpatient Primary MD for the patient is Tisovec, Kristina Pfeiffer, MD    LOS - 1  Admit date - 12/26/2023    No chief complaint on file.      Brief Narrative (HPI from H&P)    89 y.o. male with medical history significant of A-fib on chronic anticoagulation with Eliquis , orthostatic hypotension on midodrine , diabetes mellitus, hypertension, CHF, tachybradycardia syndrome with junctional bradycardia, status post [employer] dual-chamber PPM, chronic diastolic CHF, CKD stage IIIa with baseline creatinine approximately 1, hyperlipidemia presented to the ED with confusion/altered mental status.  Patient noted to have been recently hospitalized 11/18/2023-11/21/2023 for frequent falls, UTI, dehydration.  Apparently he has been getting off-and-on confused at his assisted living facility for the first few days, and asked state of confusion he punched a wall and injured his left hand, he was found to be confused, he had evidence of left hand injury and fractures of the thumb and fourth digit, chest x-ray suggestive of aortic valve infection versus CHF, elevated total bilirubin, clinically appeared dehydrated with elevated lactic acid, there was question of SIRs from unclear source and he was admitted.   Subjective:   Patient in bed, mildly confused, currently no headache, this morning no neck pain but he says his neck hurts off-and-on, left hand pain and discomfort improved, no chest pain or abdominal pain, no shortness of breath.  No nausea vomiting or focal weakness which is new.   Assessment  & Plan :   Acute on chronic metabolic  encephalopathy.  Likely developing dementia, he is having off-and-on bouts of confusion for the last few weeks, recently had change of place which likely exacerbated his problems, there was also evidence of dehydration and SIRS at the time of admission.  Currently he will be treated with supportive care, avoid benzodiazepines and narcotics, nighttime Seroquel and monitor he likely is developing age-related cognitive decline.    Family explained that he is at risk for worsening mental status on a gradual and sustained basis.  Head CT is nonacute, he has no headache or new focal deficits.  Stable TSH, stable B12, unremarkable RPR and HIV testing.  With supportive care improved, he likely is developing age-related cognitive decline with gradual but progressive chronic worsening.  SIRs - at the time of admission, chest x-ray suggestive of CHF versus atypical pneumonia, CT scan shows distended gallbladder with a possible gallstone, elevated total bilirubin, abdominal exam relatively benign.  For now we will place him on Unasyn and doxycycline covering for pneumonia and any underlying mild cholecystitis, follow cultures, respiratory viral panel negative, MRSA nasal PCR positive, HIDA scan pending.  Clinically improved and stable.  Distended gallbladder with gallstone, high total bilirubin.  HIDA scan pending, discussed with GI they have nothing to offer.  Mechanical injury with fracture of the left thumb and fourth digit.  Ortho input appreciated.  Dehydration with lactic acidosis.  Resolved with IV fluids, now has evidence of fluid overload, TED stockings and gentle diuresis.  Hypokalemia and hypophosphatemia.  Replaced.  Ongoing right shoulder, left hip and neck pain.  X-rays of the right shoulder and left hip nonacute, CT scan of the C-spine noted, has been seen by orthopedics, supportive care for his left hand injuries, discussed his next CT findings with ENT Dr. Virgia Griffins, no intervention needed, overall  stable except for mild neck pain, likely due to chronic arthritis, C-spine itself was nonacute, trial of low-dose steroids and monitor.  Paroxysmal atrial fibrillation.  [place] vas 2 score of greater than 3.  Still cholecystitis ruled out switch Eliquis  to Lovenox , low-dose beta-blocker as tolerated, monitor on telemetry.  History of dysphagia.  On dysphagia 3 diet at ALF, speech input, at risk for ongoing aspiration.  Acute on chronic  diastolic CHF EF 60%.  Has evidence of fluid overload with some acute on chronic decompensation, gentle diuresis as tolerated by blood pressure.  Foley catheter placement in the ER.  Reason unclear, no documented urinary retention, Flomax Foley catheter discontinued 12/28/2023.  Monitor.  DM type II.  Diet controlled, A1c 5.9 recently.       Condition -   Guarded  Family Communication  :  son Georgette Kins [phone]  on 12/27/2023, 12/28/2023  Code Status :  DNR  Consults  :  Ortho, [facility] Dr. Karene Oto, [facility] ENT Dr. Virgia Griffins  PUD Prophylaxis :  PPI   Procedures  :     HIDA scan.    CT C-spine.  1. No acute cervical spine fracture or destructive process. 2. New mild retropharyngeal soft tissue swelling and/or fluid, indeterminate. This could be secondary to infection/inflammation of uncertain etiology or potentially related to generalized volume overload given partially visualized pleural effusions and body wall edema. No organized fluid collection. 3. Advanced cervical disc and facet degeneration with mild-to-moderate spinal stenosis and moderate to severe neural foraminal stenosis as above  RUQ - Distended gallbladder with a stone. No further sonographic evidence of acute cholecystitis at this time. No ductal dilatation. Right-sided pleural effusion  CT Abd Pelvis - Cardiomegaly, coronary artery disease.  Aortic atherosclerosis. Small bilateral effusions with compressive atelectasis in the lower lobes. Cholelithiasis.  No evidence for acute cholecystitis. No acute  findings in the abdomen or pelvis.  CT head - Non acute      Disposition Plan  :    Status is: Observation   DVT Prophylaxis  :    Place TED hose Start: 12/27/23 0814 / Lovenox     Lab Results  Component Value Date   PLT 128 (L) 12/28/2023  Diet :  Diet Order             Diet NPO time specified  Diet effective midnight                    Inpatient Medications  Scheduled Meds:  (feeding supplement) PROSource Plus  30 mL Oral BID BM   Chlorhexidine  Gluconate Cloth  6 each Topical Daily   doxycycline  100 mg Oral Q12H   enoxaparin  (LOVENOX ) injection  110 mg Subcutaneous Q12H   fluticasone  2 spray Each Nare Daily   Gerhardt's butt cream   Topical BID   ipratropium  1 spray Each Nare BID   leptospermum manuka honey  1 Application Topical Daily   lidocaine   1 patch Transdermal Q24H   loratadine  10 mg Oral Daily   metoprolol  tartrate  25 mg Oral BID   midodrine   5 mg Oral BID WC   mupirocin ointment  1 Application Nasal BID   pantoprazole  40 mg Oral Daily   QUEtiapine  25 mg Oral QHS   rosuvastatin   10 mg Oral Daily   senna  1 tablet Oral BID   sodium chloride  flush  3 mL Intravenous Q12H   tamsulosin  0.4 mg Oral Daily   Continuous Infusions:  ampicillin-sulbactam (UNASYN) IV 3 g (12/28/23 0348)   PRN Meds:.acetaminophen , hydrALAZINE , ketorolac , levalbuterol , metoprolol  tartrate, [DISCONTINUED] ondansetron  **OR** ondansetron  (ZOFRAN ) IV, polyethylene glycol, sorbitol   Antibiotics  :    Anti-infectives (From admission, onward)    Start     Dose/Rate Route Frequency Ordered Stop   12/27/23 1500  Vancomycin  (VANCOCIN ) 1,250 mg in sodium chloride  0.9 % 250 mL IVPB  Status:  Discontinued        1,250 mg 166.7 mL/hr over 90 Minutes Intravenous Every 24 hours 12/26/23 1938 12/27/23 0834   12/27/23 1000  doxycycline (VIBRA-TABS) tablet 100 mg        100 mg Oral Every 12 hours 12/27/23 0814     12/27/23 0930  Ampicillin-Sulbactam (UNASYN) 3 g in sodium  chloride 0.9 % 100 mL IVPB        3 g 200 mL/hr over 30 Minutes Intravenous Every 6 hours 12/27/23 0834     12/27/23 0000  ceFEPIme  (MAXIPIME ) 2 g in sodium chloride  0.9 % 100 mL IVPB  Status:  Discontinued        2 g 200 mL/hr over 30 Minutes Intravenous Every 8 hours 12/26/23 1938 12/27/23 0834   12/26/23 1445  aztreonam  (AZACTAM ) 2 g in sodium chloride  0.9 % 100 mL IVPB  Status:  Discontinued        2 g 200 mL/hr over 30 Minutes Intravenous  Once 12/26/23 1432 12/26/23 1441   12/26/23 1445  metroNIDAZOLE  (FLAGYL ) IVPB 500 mg  Status:  Discontinued        500 mg 100 mL/hr over 60 Minutes Intravenous  Once 12/26/23 1432 12/26/23 1441   12/26/23 1445  vancomycin  (VANCOCIN ) IVPB 1000 mg/200 mL premix  Status:  Discontinued        1,000 mg 200 mL/hr over 60 Minutes Intravenous  Once 12/26/23 1432 12/26/23 1439   12/26/23 1445  vancomycin  (VANCOREADY) IVPB 2000 mg/400 mL        2,000 mg 200 mL/hr over 120 Minutes Intravenous  Once 12/26/23 1439 12/26/23 1715   12/26/23 1445  ceFEPIme  (MAXIPIME ) 2 g in sodium chloride  0.9 % 100 mL IVPB        2 g 200 mL/hr over  30 Minutes Intravenous  Once 12/26/23 1442 12/26/23 1617   12/26/23 1445  metroNIDAZOLE  (FLAGYL ) IVPB 500 mg        500 mg 100 mL/hr over 60 Minutes Intravenous  Once 12/26/23 1442 12/26/23 1617         Objective:   Vitals:   12/28/23 0503 12/28/23 0524 12/28/23 0600 12/28/23 0816  BP: (!) 132/101  126/69 (!) 134/93  Pulse: 93 93  92  Resp: (!) 22 18  (!) 25  Temp: 97.9 F (36.6 C)   (!) 97.2 F (36.2 C)  TempSrc: Oral   Oral  SpO2: 92% 96%  93%  Weight:  105.7 kg      Wt Readings from Last 3 Encounters:  12/28/23 105.7 kg  11/18/23 104.3 kg  11/14/23 104.3 kg     Intake/Output Summary (Last 24 hours) at 12/28/2023 0820 Last data filed at 12/28/2023 0348 Gross per 24 hour  Intake 953.44 ml  Output 1600 ml  Net -646.56 ml     Physical Exam  Awake, minimally confused no new F.N deficits, Foley catheter in  place, right upper extremity slightly weak due to pain in the right shoulder and guarding,  [place]riever.AT,PERRAL Supple Neck, No JVD,   Symmetrical Chest wall movement, Good air movement bilaterally, CTAB RRR,No Gallops,Rubs or new Murmurs,  +ve B.Sounds, Abd Soft, No tenderness,   No Cyanosis, Clubbing or edema     RN pressure injury documentation: Pressure Injury 06/10/22 Buttocks Left;Lower;Anterior Stage 2 -  Partial thickness loss of dermis presenting as a shallow open injury with a red, pink wound bed without slough. small size of a pea (Active)  06/10/22 1849  Location: Buttocks  Location Orientation: Left;Lower;Anterior  Staging: Stage 2 -  Partial thickness loss of dermis presenting as a shallow open injury with a red, pink wound bed without slough.  Wound Description (Comments): small size of a pea  Present on Admission: Yes      Data Review:    Recent Labs  Lab 12/26/23 1432 12/27/23 0547 12/28/23 0543  WBC 10.9* 7.8 6.2  HGB 11.2* 10.8* 10.3*  HCT 34.4* 33.6* 32.0*  PLT 141* 110* 128*  MCV 100.3* 99.7 99.7  MCH 32.7 32.0 32.1  MCHC 32.6 32.1 32.2  RDW 14.4 14.4 14.2  LYMPHSABS 1.2 1.2 1.0  MONOABS 1.4* 0.7 0.6  EOSABS 0.0 0.1 0.1  BASOSABS 0.0 0.0 0.1    Recent Labs  Lab 12/26/23 1432 12/26/23 1438 12/26/23 1711 12/26/23 2136 12/27/23 0547 12/27/23 1005 12/28/23 0543  NA 136  --   --   --  137  --  137  K 3.1*  --   --   --  3.4*  --  3.6  CL 100  --   --   --  102  --  101  CO2 25  --   --   --  25  --  26  ANIONGAP 11  --   --   --  10  --  10  GLUCOSE 139*  --   --   --  104*  --  103*  BUN 10  --   --   --  10  --  13  CREATININE 0.71  --   --   --  0.77  --  0.80  AST 19  --   --   --  15  --   --   ALT 10  --   --   --  9  --   --  ALKPHOS 75  --   --   --  67  --   --   BILITOT 5.1*  --   --   --  3.8*  --   --   ALBUMIN 3.0*  --   --   --  2.6*  --   --   CRP  --   --   --   --  20.0*  --  15.3*  PROCALCITON  --   --   --   --  0.10  --    --   LATICACIDVEN  --  2.4* 3.5* 1.3  --   --   --   INR 2.2*  --   --   --   --   --   --   TSH  --   --   --   --   --  2.076  --   BNP  --   --   --   --  396.8*  --  513.0*  MG 1.8  --   --   --  2.0  --  1.9  PHOS  --   --   --   --  2.4*  --   --   CALCIUM  8.9  --   --   --  8.6*  --  8.2*      Recent Labs  Lab 12/26/23 1432 12/26/23 1438 12/26/23 1711 12/26/23 2136 12/27/23 0547 12/27/23 1005 12/28/23 0543  CRP  --   --   --   --  20.0*  --  15.3*  PROCALCITON  --   --   --   --  0.10  --   --   LATICACIDVEN  --  2.4* 3.5* 1.3  --   --   --   INR 2.2*  --   --   --   --   --   --   TSH  --   --   --   --   --  2.076  --   BNP  --   --   --   --  396.8*  --  513.0*  MG 1.8  --   --   --  2.0  --  1.9  CALCIUM  8.9  --   --   --  8.6*  --  8.2*    --------------------------------------------------------------------------------------------------------------- Lab Results  Component Value Date   CHOL 111 09/16/2023   HDL 40 (L) 09/16/2023   LDLCALC 49 09/16/2023   TRIG 109 09/16/2023   CHOLHDL 2.8 09/16/2023    Lab Results  Component Value Date   HGBA1C 5.9 (H) 11/20/2023   Recent Labs    12/27/23 1005  TSH 2.076   Recent Labs    12/27/23 1005  VITAMINB12 298      Micro Results Recent Results (from the past 240 hours)  Blood Culture (routine x 2)     Status: None (Preliminary result)   Collection Time: 12/26/23  2:30 PM   Specimen: BLOOD RIGHT ARM  Result Value Ref Range Status   Specimen Description BLOOD RIGHT ARM  Final   Special Requests   Final    BOTTLES DRAWN AEROBIC AND ANAEROBIC Blood Culture adequate volume   Culture   Final    NO GROWTH 2 DAYS Performed at [facility], [address], [place], [place] [address]    Report Status PENDING  Incomplete  Resp panel by RT-PCR (RSV, Flu A&B, Covid) Anterior  Nasal Swab     Status: None   Collection Time: 12/26/23  2:32 PM   Specimen: Anterior Nasal Swab  Result Value Ref Range Status    SARS Coronavirus 2 by RT PCR NEGATIVE NEGATIVE Final   Influenza A by PCR NEGATIVE NEGATIVE Final   Influenza B by PCR NEGATIVE NEGATIVE Final    Comment: (NOTE) The Xpert Xpress SARS-CoV-2/FLU/RSV plus assay is intended as an aid in the diagnosis of influenza from Nasopharyngeal swab specimens and should not be used as a sole basis for treatment. Nasal washings and aspirates are unacceptable for Xpert Xpress SARS-CoV-2/FLU/RSV testing.  Fact Sheet for Patients: BloggerCourse.com  Fact Sheet for Healthcare Providers: SeriousBroker.it  This test is not yet approved or cleared by the [place]  FDA and has been authorized for detection and/or diagnosis of SARS-CoV-2 by FDA under an Emergency Use Authorization (EUA). This EUA will remain in effect (meaning this test can be used) for the duration of the COVID-19 declaration under Section 564(b)(1) of the Act, 21 U.S.C. section 360bbb-3(b)(1), unless the authorization is terminated or revoked.     Resp Syncytial Virus by PCR NEGATIVE NEGATIVE Final    Comment: (NOTE) Fact Sheet for Patients: BloggerCourse.com  Fact Sheet for Healthcare Providers: SeriousBroker.it  This test is not yet approved or cleared by the [place]  FDA and has been authorized for detection and/or diagnosis of SARS-CoV-2 by FDA under an Emergency Use Authorization (EUA). This EUA will remain in effect (meaning this test can be used) for the duration of the COVID-19 declaration under Section 564(b)(1) of the Act, 21 U.S.C. section 360bbb-3(b)(1), unless the authorization is terminated or revoked.  Performed at [facility], [address] [place], [place] [address]   Blood Culture (routine x 2)     Status: None (Preliminary result)   Collection Time: 12/26/23  2:44 PM   Specimen: BLOOD RIGHT ARM  Result Value Ref Range Status   Specimen  Description BLOOD RIGHT ARM  Final   Special Requests   Final    BOTTLES DRAWN AEROBIC AND ANAEROBIC Blood Culture adequate volume   Culture   Final    NO GROWTH 2 DAYS Performed at [facility], [address] [place], [place] [address]    Report Status PENDING  Incomplete  MRSA Next Gen by PCR, Nasal     Status: Abnormal   Collection Time: 12/27/23  4:30 AM   Specimen: Nasal Mucosa; Nasal Swab  Result Value Ref Range Status   MRSA by PCR Next Gen DETECTED (A) NOT DETECTED Final    Comment: RESULT CALLED TO, READ BACK BY AND VERIFIED WITH: P BURTON,RN@[phone]  12/27/23 MK (NOTE) The GeneXpert MRSA Assay (FDA approved for NASAL specimens only), is one component of a comprehensive MRSA colonization surveillance program. It is not intended to diagnose MRSA infection nor to guide or monitor treatment for MRSA infections. Test performance is not FDA approved in patients less than 50 years old. Performed at [facility], [address], [place], [place] [address]   Respiratory (~20 pathogens) panel by PCR     Status: None   Collection Time: 12/27/23  8:42 AM   Specimen: Nasopharyngeal Swab; Respiratory  Result Value Ref Range Status   Adenovirus NOT DETECTED NOT DETECTED Final   Coronavirus 229E NOT DETECTED NOT DETECTED Final    Comment: (NOTE) The Coronavirus on the Respiratory Panel, DOES NOT test for the novel  Coronavirus (2019 nCoV)    Coronavirus HKU1 NOT  DETECTED NOT DETECTED Final   Coronavirus NL63 NOT DETECTED NOT DETECTED Final   Coronavirus OC43 NOT DETECTED NOT DETECTED Final   Metapneumovirus NOT DETECTED NOT DETECTED Final   Rhinovirus / Enterovirus NOT DETECTED NOT DETECTED Final   Influenza A NOT DETECTED NOT DETECTED Final   Influenza B NOT DETECTED NOT DETECTED Final   Parainfluenza Virus 1 NOT DETECTED NOT DETECTED Final   Parainfluenza Virus 2 NOT DETECTED NOT DETECTED Final   Parainfluenza Virus 3 NOT DETECTED NOT DETECTED Final   Parainfluenza  Virus 4 NOT DETECTED NOT DETECTED Final   Respiratory Syncytial Virus NOT DETECTED NOT DETECTED Final   Bordetella pertussis NOT DETECTED NOT DETECTED Final   Bordetella Parapertussis NOT DETECTED NOT DETECTED Final   Chlamydophila pneumoniae NOT DETECTED NOT DETECTED Final   Mycoplasma pneumoniae NOT DETECTED NOT DETECTED Final    Comment: Performed at [facility] Lab, [address], [place], [place] [address]    Radiology Report CT CERVICAL SPINE W CONTRAST Result Date: 12/27/2023 CLINICAL DATA:  Chronic neck pain. EXAM: CT CERVICAL SPINE WITH CONTRAST TECHNIQUE: Multidetector CT imaging of the cervical spine was performed during intravenous contrast administration. Multiplanar CT image reconstructions were also generated. RADIATION DOSE REDUCTION: This exam was performed according to the departmental dose-optimization program which includes automated exposure control, adjustment of the mA and/or kV according to patient size and/or use of iterative reconstruction technique. CONTRAST:  75mL OMNIPAQUE  IOHEXOL  350 MG/ML SOLN COMPARISON:  CT cervical spine 12/05/2023 FINDINGS: Alignment: Unchanged reversal of the normal cervical lordosis and trace anterolisthesis of C3 on C4 and C4 on C5. Skull base and vertebrae: No acute fracture or suspicious lesion. Moderate atlantodental degenerative changes with ligamentous hypertrophy resulting in at least mild spinal stenosis. Soft tissues and spinal canal: New mild retropharyngeal soft tissue swelling and/or minimal fluid. No organized fluid collection. No visible spinal canal hematoma. Disc levels: Similar appearance of advanced mid and lower cervical disc degeneration and upper cervical facet arthrosis. Interbody ankylosis at C5-6 and C6-7. Relatively diffuse, mild cervical spinal canal stenosis with potentially moderate stenosis at C4-5. Moderate to severe neural foraminal stenosis bilaterally at C2-3 and on the right at C3-4 and C4-5. Upper chest: Partially  visualized bilateral pleural effusions, increased from prior. Partially visualized body wall edema. Other: Major vessels of the neck are patent. Mild atherosclerosis at the right greater than left carotid bifurcations. IMPRESSION: 1. No acute cervical spine fracture or destructive process. 2. New mild retropharyngeal soft tissue swelling and/or fluid, indeterminate. This could be secondary to infection/inflammation of uncertain etiology or potentially related to generalized volume overload given partially visualized pleural effusions and body wall edema. No organized fluid collection. 3. Advanced cervical disc and facet degeneration with mild-to-moderate spinal stenosis and moderate to severe neural foraminal stenosis as above. Electronically Signed   By: Aundra Lee M.D.   On: 12/27/2023 14:51   DG Shoulder Right Result Date: 12/27/2023 CLINICAL DATA:  Right shoulder pain. EXAM: RIGHT SHOULDER - 2+ VIEW COMPARISON:  November 18, 2023. FINDINGS: There is no evidence of fracture or dislocation. Severe narrowing of glenohumeral joint is noted. Soft tissues are unremarkable. IMPRESSION: Severe osteoarthritis of right glenohumeral joint. No acute abnormality seen. Electronically Signed   By: Rosalene Colon M.D.   On: 12/27/2023 10:42   DG HIP UNILAT WITH PELVIS 2-3 VIEWS LEFT Result Date: 12/27/2023 CLINICAL DATA:  Left hip pain. EXAM: DG HIP (WITH OR WITHOUT PELVIS) 2-3V LEFT COMPARISON:  None Available. FINDINGS: There is no  evidence of hip fracture or dislocation. Mild osteophyte formation of left hip is noted. IMPRESSION: Mild degenerative joint disease of left hip. No acute abnormality seen. Electronically Signed   By: Rosalene Colon M.D.   On: 12/27/2023 10:40   DG Chest Port 1 View Result Date: 12/27/2023 CLINICAL DATA:  Shortness of breath. EXAM: PORTABLE CHEST 1 VIEW COMPARISON:  12/26/2023 FINDINGS: The cardio pericardial silhouette is enlarged. Diffuse interstitial and basilar airspace disease, left  greater than right, is similar to prior. Left-sided permanent pacemaker again noted. No acute bony abnormality. Telemetry leads overlie the chest. IMPRESSION: Diffuse interstitial and basilar airspace disease, left greater than right, similar to prior. Imaging features could be related to interstitial edema with basilar atelectasis or infiltrate. Electronically Signed   By: Donnal Fusi M.D.   On: 12/27/2023 06:57   US  Abdomen Limited RUQ (LIVER/GB) Result Date: 12/26/2023 CLINICAL DATA:  Sepsis. EXAM: ULTRASOUND ABDOMEN LIMITED RIGHT UPPER QUADRANT COMPARISON:  CT 12/26/2023. FINDINGS: Gallbladder: Dilated gallbladder. Dependent stone. No wall thickening or adjacent fluid. No reported sonographic Murphy's sign. As per the sonographer the patient was unable to undergo left lateral decubitus imaging at this time due to clinical condition. Common bile duct: Diameter: 4 mm Liver: No focal lesion identified. Within normal limits in parenchymal echogenicity. Portal vein is patent on color Doppler imaging with normal direction of blood flow towards the liver. Other: Right-sided pleural effusion. IMPRESSION: Distended gallbladder with a stone. No further sonographic evidence of acute cholecystitis at this time. No ductal dilatation. Right-sided pleural effusion Electronically Signed   By: Adrianna Horde M.D.   On: 12/26/2023 18:13   CT ABDOMEN PELVIS W CONTRAST Result Date: 12/26/2023 CLINICAL DATA:  Abdominal pain EXAM: CT ABDOMEN AND PELVIS WITH CONTRAST TECHNIQUE: Multidetector CT imaging of the abdomen and pelvis was performed using the standard protocol following bolus administration of intravenous contrast. RADIATION DOSE REDUCTION: This exam was performed according to the departmental dose-optimization program which includes automated exposure control, adjustment of the mA and/or kV according to patient size and/or use of iterative reconstruction technique. CONTRAST:  75mL OMNIPAQUE  IOHEXOL  350 MG/ML SOLN  COMPARISON:  06/10/2022 FINDINGS: Lower chest: Cardiomegaly. Pacer wires in the right heart. Coronary artery and aortic atherosclerosis. Small bilateral pleural effusions. Dependent atelectasis in the lower lobes. Hepatobiliary: No focal hepatic abnormality. Small layering gallstone within the gallbladder. No biliary ductal dilatation. Pancreas: No focal abnormality or ductal dilatation. Spleen: No focal abnormality.  Normal size. Adrenals/Urinary Tract: Bilateral renal cysts are stable and appear benign. No follow-up imaging recommended. No stones or hydronephrosis. Adrenal glands and urinary bladder unremarkable. Urinary bladder decompressed with Foley catheter in place. Stomach/Bowel: Stomach, large and small bowel grossly unremarkable. Vascular/Lymphatic: Aortoiliac atherosclerosis. No evidence of aneurysm or adenopathy. Reproductive: No visible focal abnormality. Other: No free fluid or free air. Musculoskeletal: No acute bony abnormality. IMPRESSION: Cardiomegaly, coronary artery disease.  Aortic atherosclerosis. Small bilateral effusions with compressive atelectasis in the lower lobes. Cholelithiasis.  No evidence for acute cholecystitis. No acute findings in the abdomen or pelvis. Electronically Signed   By: Janeece Mechanic M.D.   On: 12/26/2023 17:17   CT Head Wo Contrast Result Date: 12/26/2023 CLINICAL DATA:  Mental status changes EXAM: CT HEAD WITHOUT CONTRAST TECHNIQUE: Contiguous axial images were obtained from the base of the skull through the vertex without intravenous contrast. RADIATION DOSE REDUCTION: This exam was performed according to the departmental dose-optimization program which includes automated exposure control, adjustment of the mA and/or kV according to patient  size and/or use of iterative reconstruction technique. COMPARISON:  None Available. FINDINGS: Brain: There is atrophy and chronic small vessel disease changes. No acute intracranial abnormality. Specifically, no hemorrhage,  hydrocephalus, mass lesion, acute infarction, or significant intracranial injury. Vascular: No hyperdense vessel or unexpected calcification. Skull: No acute calvarial abnormality. Sinuses/Orbits: No acute findings Other: None IMPRESSION: Atrophy, chronic microvascular disease. No acute intracranial abnormality. Electronically Signed   By: Janeece Mechanic M.D.   On: 12/26/2023 17:13   DG Hand Complete Left Result Date: 12/26/2023 CLINICAL DATA:  Trauma to the left upper extremity.  Pain. EXAM: LEFT HAND - COMPLETE 3+ VIEW; LEFT ELBOW - COMPLETE 3+ VIEW; LEFT FOREARM - 2 VIEW COMPARISON:  None Available. FINDINGS: Lucency through the base of the distal phalanx of the thumb suboptimally evaluated due to positioning. This may be chronic however, an acute fracture is not excluded. Correlation with clinical exam recommended. Dedicated radiograph of the thumb may provide better evaluation if there is clinical concern for fracture. There is an angulated fracture of the proximal phalanx of the fourth digit with dorsal angulation of the distal fracture fragment. No other acute fracture. No dislocation. The bones are osteopenic. Soft tissue swelling of the wrist. No radiopaque foreign object or soft tissue gas. IMPRESSION: 1. Angulated fracture of the proximal phalanx of the fourth digit. 2. Possible nondisplaced fracture of the base of the distal phalanx of the thumb. Evaluation of the hand is limited due to suboptimal positioning. Electronically Signed   By: Angus Bark M.D.   On: 12/26/2023 15:52   DG Forearm Left Result Date: 12/26/2023 CLINICAL DATA:  Trauma to the left upper extremity.  Pain. EXAM: LEFT HAND - COMPLETE 3+ VIEW; LEFT ELBOW - COMPLETE 3+ VIEW; LEFT FOREARM - 2 VIEW COMPARISON:  None Available. FINDINGS: Lucency through the base of the distal phalanx of the thumb suboptimally evaluated due to positioning. This may be chronic however, an acute fracture is not excluded. Correlation with clinical exam  recommended. Dedicated radiograph of the thumb may provide better evaluation if there is clinical concern for fracture. There is an angulated fracture of the proximal phalanx of the fourth digit with dorsal angulation of the distal fracture fragment. No other acute fracture. No dislocation. The bones are osteopenic. Soft tissue swelling of the wrist. No radiopaque foreign object or soft tissue gas. IMPRESSION: 1. Angulated fracture of the proximal phalanx of the fourth digit. 2. Possible nondisplaced fracture of the base of the distal phalanx of the thumb. Evaluation of the hand is limited due to suboptimal positioning. Electronically Signed   By: Angus Bark M.D.   On: 12/26/2023 15:52   DG Elbow Complete Left Result Date: 12/26/2023 CLINICAL DATA:  Trauma to the left upper extremity.  Pain. EXAM: LEFT HAND - COMPLETE 3+ VIEW; LEFT ELBOW - COMPLETE 3+ VIEW; LEFT FOREARM - 2 VIEW COMPARISON:  None Available. FINDINGS: Lucency through the base of the distal phalanx of the thumb suboptimally evaluated due to positioning. This may be chronic however, an acute fracture is not excluded. Correlation with clinical exam recommended. Dedicated radiograph of the thumb may provide better evaluation if there is clinical concern for fracture. There is an angulated fracture of the proximal phalanx of the fourth digit with dorsal angulation of the distal fracture fragment. No other acute fracture. No dislocation. The bones are osteopenic. Soft tissue swelling of the wrist. No radiopaque foreign object or soft tissue gas. IMPRESSION: 1. Angulated fracture of the proximal phalanx of the fourth digit.  2. Possible nondisplaced fracture of the base of the distal phalanx of the thumb. Evaluation of the hand is limited due to suboptimal positioning. Electronically Signed   By: Angus Bark M.D.   On: 12/26/2023 15:52   DG Chest Port 1 View Result Date: 12/26/2023 CLINICAL DATA:  Questionable sepsis, evaluate for abnormality.  Altered mental status. EXAM: PORTABLE CHEST 1 VIEW COMPARISON:  Radiographs 06/10/2022 and 08/09/2019.  CT 06/10/2022. FINDINGS: 1446 hours. Two views submitted. Left subclavian pacemaker leads project over the right atrium and right ventricle. The heart size and mediastinal contours are stable with aortic atherosclerosis. Increased vascular congestion with probable patchy atelectasis at both lung bases. No overt pulmonary edema, confluent airspace disease, pneumothorax or significant pleural effusion. No acute osseous findings are seen. There are degenerative changes throughout the spine. IMPRESSION: Increased vascular congestion with probable patchy atelectasis at both lung bases. No definite evidence of pneumonia. Electronically Signed   By: Elmon Hagedorn M.D.   On: 12/26/2023 15:00     Signature  -   Lynnwood Sauer M.D on 12/28/2023 at 8:20 AM   -  To page go to [email or web address]

## 2023-12-28 NOTE — Plan of Care (Signed)
  Problem: Clinical Measurements: Goal: Diagnostic test results will improve Outcome: Progressing Goal: Respiratory complications will improve Outcome: Progressing   Problem: Activity: Goal: Risk for activity intolerance will decrease Outcome: Progressing   Problem: Nutrition: Goal: Adequate nutrition will be maintained Outcome: Progressing   Problem: Coping: Goal: Level of anxiety will decrease Outcome: Progressing   Problem: Elimination: Goal: Will not experience complications related to bowel motility Outcome: Progressing   Problem: Safety: Goal: Ability to remain free from injury will improve Outcome: Progressing

## 2023-12-28 NOTE — Progress Notes (Signed)
 PT Cancellation Note  Patient Details Name: Dylan [name] MRN: [id number] DOB: 11/28/1925   Cancelled Treatment:    Reason Eval/Treat Not Completed: Patient at procedure or test/unavailable  Currently off unit for scan. Will check back this afternoon as schedule permits.     Jory Ng, PT, DPT [facility]  Rehabilitation Services Physical Therapist Office: [phone] Website: [email or web address]   Alinda Irani 12/28/2023, 11:21 AM

## 2023-12-28 NOTE — Plan of Care (Signed)
   Problem: Education: Goal: Knowledge of General Education information will improve Description: Including pain rating scale, medication(s)/side effects and non-pharmacologic comfort measures Outcome: Not Progressing

## 2023-12-28 NOTE — Progress Notes (Signed)
 OT Cancellation Note  Patient Details Name: ADVIT ZUCCA MRN: [id number] DOB: [birth year]   Cancelled Treatment:    Reason Eval/Treat Not Completed: Patient at procedure or test/ unavailable (Pt currently off unit for a scan. OT to reattemtpt to see pt for OT eval at a later time as appropriate/available.)  Laqueshia Cihlar "Jenine Mix" M., OTR/L, MA Acute Rehab [phone]   Walt Gunner 12/28/2023, 11:48 AM

## 2023-12-29 DIAGNOSIS — G9349 Other encephalopathy: Secondary | ICD-10-CM

## 2023-12-29 DIAGNOSIS — I4891 Unspecified atrial fibrillation: Secondary | ICD-10-CM | POA: Diagnosis not present

## 2023-12-29 DIAGNOSIS — I5043 Acute on chronic combined systolic (congestive) and diastolic (congestive) heart failure: Secondary | ICD-10-CM | POA: Diagnosis not present

## 2023-12-29 DIAGNOSIS — R6510 Systemic inflammatory response syndrome (SIRS) of non-infectious origin without acute organ dysfunction: Secondary | ICD-10-CM | POA: Diagnosis not present

## 2023-12-29 DIAGNOSIS — I10 Essential (primary) hypertension: Secondary | ICD-10-CM

## 2023-12-29 DIAGNOSIS — E876 Hypokalemia: Secondary | ICD-10-CM | POA: Diagnosis not present

## 2023-12-29 DIAGNOSIS — I480 Paroxysmal atrial fibrillation: Secondary | ICD-10-CM | POA: Diagnosis not present

## 2023-12-29 DIAGNOSIS — R0600 Dyspnea, unspecified: Secondary | ICD-10-CM

## 2023-12-29 DIAGNOSIS — I5032 Chronic diastolic (congestive) heart failure: Secondary | ICD-10-CM | POA: Diagnosis not present

## 2023-12-29 LAB — BASIC METABOLIC PANEL WITH GFR
Anion gap: 8 (ref 5–15)
BUN: 14 mg/dL (ref 8–23)
CO2: 26 mmol/L (ref 22–32)
Calcium: 8.5 mg/dL — ABNORMAL LOW (ref 8.9–10.3)
Chloride: 104 mmol/L (ref 98–111)
Creatinine, Ser: 0.82 mg/dL (ref 0.61–1.24)
GFR, Estimated: >60 mL/min (ref >60)
Glucose, Bld: 131 mg/dL — ABNORMAL HIGH (ref 70–99)
Potassium: 3.7 mmol/L (ref 3.5–5.1)
Sodium: 138 mmol/L (ref 135–145)

## 2023-12-29 LAB — MAGNESIUM: Magnesium: 2 mg/dL (ref 1.7–2.4)

## 2023-12-29 LAB — CBC WITH DIFFERENTIAL/PLATELET
Abs Immature Granulocytes: 0.01 10*3/uL (ref 0.00–0.07)
Basophils Absolute: 0 10*3/uL (ref 0.0–0.1)
Basophils Relative: 0 %
Eosinophils Absolute: 0 10*3/uL (ref 0.0–0.5)
Eosinophils Relative: 0 %
HCT: 34.6 % — ABNORMAL LOW (ref 39.0–52.0)
Hemoglobin: 11.2 g/dL — ABNORMAL LOW (ref 13.0–17.0)
Immature Granulocytes: 0 %
Lymphocytes Relative: 16 %
Lymphs Abs: 0.9 10*3/uL (ref 0.7–4.0)
MCH: 31.7 pg (ref 26.0–34.0)
MCHC: 32.4 g/dL (ref 30.0–36.0)
MCV: 98 fL (ref 80.0–100.0)
Monocytes Absolute: 0.5 10*3/uL (ref 0.1–1.0)
Monocytes Relative: 10 %
Neutro Abs: 3.9 10*3/uL (ref 1.7–7.7)
Neutrophils Relative %: 74 %
Platelets: 169 10*3/uL (ref 150–400)
RBC: 3.53 MIL/uL — ABNORMAL LOW (ref 4.22–5.81)
RDW: 13.9 % (ref 11.5–15.5)
WBC: 5.3 10*3/uL (ref 4.0–10.5)
nRBC: 0 % (ref 0.0–0.2)

## 2023-12-29 LAB — C-REACTIVE PROTEIN: CRP: 8.9 mg/dL — ABNORMAL HIGH (ref <1.0)

## 2023-12-29 LAB — BRAIN NATRIURETIC PEPTIDE: B Natriuretic Peptide: 732.8 pg/mL — ABNORMAL HIGH (ref 0.0–100.0)

## 2023-12-29 LAB — PROCALCITONIN: Procalcitonin: <0.1 ng/mL

## 2023-12-29 MED ORDER — METHYLPREDNISOLONE SODIUM SUCC 40 MG IJ SOLR
40.0000 mg | INTRAMUSCULAR | Status: DC
  Administered 2023-12-29: 40 mg via INTRAVENOUS
  Filled 2023-12-29: qty 1

## 2023-12-29 NOTE — Plan of Care (Signed)
  Problem: Pain Managment: Goal: General experience of comfort will improve and/or be controlled Outcome: Progressing   Problem: Safety: Goal: Ability to remain free from injury will improve Outcome: Progressing   Problem: Skin Integrity: Goal: Risk for impaired skin integrity will decrease Outcome: Progressing

## 2023-12-29 NOTE — Evaluation (Signed)
 Occupational Therapy Evaluation Patient Details Name: Dylan [name] MRN: [id number] DOB: March 21, 1926 Today's Date: 12/29/2023   History of Present Illness   89 y.o. male admitted from SNF on 12/26/23 with worsening AMS, agitation punching wall sustaining hand pain. Workup revealed SIRS, acute metabolic encephalopathy, L 4th phalanx fx and possible thumb fx, gallbladder stone. Head CT negative for acute injury; chronic microvascular disease. Concern for CHF vs atypical PNA. Of note, recent admit 11/18/23-11/21/23 with falls, UTI, dehydration; ED admit 4/17 after fall hitting back of head. Other PMH includes afib on Eliquis , orthostatic hypotension, DM, HTN, CHF, tachybrady syndrome, s/p PPM, CKD, HLD, COVID-19, back sx.     Clinical Impressions PTA, pt recently at [facility] rehab and transitioned to long term care, however, 1 month ago, son reports pt exercising via trainer and recumbent bike despite this. Upon eval, pt pleasantly confused, following one step commands, with fair awareness of current deficits physically. Pt needing mod-max A for functional mobility this session. LUE with ulnar gutter splint applied. Pt needing mod cues to avoid putting weight through LUE during mobility. Pt needing min-total A for ADL at time of eval. Patient will benefit from continued inpatient follow up therapy, <3 hours/day. Discussed this with son present who is agreeable.    If plan is discharge home, recommend the following:   Two people to help with walking and/or transfers;A lot of help with bathing/dressing/bathroom;Assistance with cooking/housework;Two people to help with bathing/dressing/bathroom;Help with stairs or ramp for entrance;Assist for transportation;Assistance with feeding     Functional Status Assessment   Patient has had a recent decline in their functional status and demonstrates the ability to make significant improvements in function in a reasonable and predictable amount of time.      Equipment Recommendations   Other (comment) (defer)     Recommendations for Other Services         Precautions/Restrictions   Precautions Precautions: Fall;Other (comment) Recall of Precautions/Restrictions: Impaired Precaution/Restrictions Comments: watch vitals Required Braces or Orthoses: Splint/Cast Splint/Cast: L ulnar gutter splint, thumb splint Restrictions Weight Bearing Restrictions Per Provider Order: Yes Other Position/Activity Restrictions: no formal orders, attempted to keep L hand NWB     Mobility Bed Mobility Overal bed mobility: Needs Assistance Bed Mobility: Supine to Sit, Sit to Supine     Supine to sit: Mod assist, HOB elevated, Used rails Sit to supine: Mod assist   General bed mobility comments: Mod A for sequencing, truncal elevation, bringing LLE toward R EOB. A to bring BLE back into bed and reposition    Transfers Overall transfer level: Needs assistance Equipment used: 1 person hand held assist Transfers: Sit to/from Stand, Bed to chair/wheelchair/BSC Sit to Stand: Mod assist, Max assist (ICU bed in lowest height)          Lateral/Scoot Transfers: Min assist General transfer comment: STS up x2 with mod-max A with pt with poor hip extension and very stooped posture in standing. Able to scoot toward [facility] toward R with min A      Balance Overall balance assessment: Needs assistance Sitting-balance support: No upper extremity supported, Feet supported Sitting balance-[name]: Fair     Standing balance support: Single extremity supported, During functional activity Standing balance-[name]: Poor Standing balance comment: reliant on UE support and external assist                           ADL either performed or assessed with clinical judgement   ADL  Overall ADL's : Needs assistance/impaired Eating/Feeding: Minimal assistance;Sitting   Grooming: Minimal assistance;Sitting   Upper Body Bathing: Moderate  assistance;Sitting   Lower Body Bathing: Total assistance;Sit to/from stand   Upper Body Dressing : Maximal assistance;Sitting   Lower Body Dressing: Total assistance;Sit to/from stand               Functional mobility during ADLs: Moderate assistance General ADL Comments: STS only     Vision Patient Visual Report: No change from baseline Additional Comments: not formally assessed     Perception         Praxis         Pertinent Vitals/Pain Pain Assessment Pain Assessment: Faces Faces Pain Scale: Hurts little more Pain Location: generalized, especially L hip and neck Pain Descriptors / Indicators: Discomfort, Grimacing, Guarding Pain Intervention(s): Monitored during session, Limited activity within patient's tolerance, Repositioned, Heat applied     Extremity/Trunk Assessment Upper Extremity Assessment Upper Extremity Assessment: Generalized weakness;LUE deficits/detail LUE Deficits / Details: ulnar gutter splint able to move 2nd digit not splinted and AROM of elbow and wrist LUE Coordination: decreased fine motor   Lower Extremity Assessment Lower Extremity Assessment: Defer to PT evaluation   Cervical / Trunk Assessment Cervical / Trunk Assessment: Other exceptions Cervical / Trunk Exceptions: c/o neck pain with limited cervical AROM in all directions   Communication Communication Communication: Impaired Factors Affecting Communication: Hearing impaired   Cognition Arousal: Alert Behavior During Therapy: WFL for tasks assessed/performed Cognition: History of cognitive impairments             OT - Cognition Comments: folows basic commands, believes he is in [place]                  Following commands: Intact       Cueing  General Comments   Cueing Techniques: Verbal cues;Gestural cues  son present   Exercises     Shoulder Instructions      Home Living Family/patient expects to be discharged to:: Skilled nursing facility                                  Additional Comments: per family, recently transferred to LTC at [facility] from rehab      Prior Functioning/Environment Prior Level of Function : Needs assist;History of Falls (last six months)             Mobility Comments: was ambulating with assist and walker; has had multiple falls and worsening mobility. family reports working with a trainer last month for strengthening; prior to the past few months, pt very active ADLs Comments: staff assists with ADL/iADLs    OT Problem List: Decreased strength;Decreased activity tolerance;Impaired balance (sitting and/or standing);Decreased cognition;Decreased safety awareness;Decreased knowledge of use of DME or AE;Decreased knowledge of precautions   OT Treatment/Interventions: Self-care/ADL training;Therapeutic exercise;DME and/or AE instruction;Balance training;Cognitive remediation/compensation;Therapeutic activities      OT Goals(Current goals can be found in the care plan section)   Acute Rehab OT Goals Patient Stated Goal: none stated OT Goal Formulation: With patient Time For Goal Achievement: 01/12/24 Potential to Achieve Goals: Good   OT Frequency:  Min 1X/week    Co-evaluation              AM-PAC OT "6 Clicks" Daily Activity     Outcome Measure Help from another person eating meals?: A Little Help from another person taking care of personal grooming?: A  Little Help from another person toileting, which includes using toliet, bedpan, or urinal?: Total Help from another person bathing (including washing, rinsing, drying)?: A Lot Help from another person to put on and taking off regular upper body clothing?: A Lot Help from another person to put on and taking off regular lower body clothing?: Total 6 Click Score: 12   End of Session Equipment Utilized During Treatment: Gait belt;Oxygen Nurse Communication: Mobility status;Other (comment) (applied hot pack to  neck)  Activity Tolerance: Patient tolerated treatment well Patient left: in bed;with call bell/phone within reach;with bed alarm set;with family/visitor present  OT Visit Diagnosis: Unsteadiness on feet (R26.81);Muscle weakness (generalized) (M62.81);Other symptoms and signs involving cognitive function                Time: 1459-1539 OT Time Calculation (min): 40 min Charges:  OT General Charges $OT Visit: 1 Visit OT Evaluation $OT Eval Moderate Complexity: 1 Mod OT Treatments $Self Care/Home Management : 8-22 mins $Therapeutic Activity: 8-22 mins  Emery Hans, OTD, OTR/L [facility] Acute Rehabilitation Office: [phone]   Emery Hans 12/29/2023, 5:15 PM

## 2023-12-29 NOTE — Evaluation (Addendum)
 Physical Therapy Evaluation Patient Details Name: Dylan [name] MRN: [id number] DOB: 1925/09/15 Today's Date: 12/29/2023  History of Present Illness  89 y.o. male admitted from SNF on 12/26/23 with worsening AMS, agitation punching wall sustaining hand pain. Workup revealed SIRS, acute metabolic encephalopathy, L 4th phalanx fx and possible thumb fx, gallbladder stone. Head CT negative for acute injury; chronic microvascular disease. Concern for CHF vs atypical PNA. Of note, recent admit 11/18/23-11/21/23 with falls, UTI, dehydration; ED admit 4/17 after fall hitting back of head. Other PMH includes afib on Eliquis , orthostatic hypotension, DM, HTN, CHF, tachybrady syndrome, s/p PPM, CKD, HLD, COVID-19, back sx.   Clinical Impression  Pt presents with an overall decrease in functional mobility secondary to above. PTA, pt resident at [facility], recently transitioned from SNF rehab; pt was ambulating with walker and assist, staff assist for ADL/iADLs. Today, pt able to perform prolonged sitting EOB activity and multiple standing trials with mod-maxA. Pt limited by generalized weakness, decreased activity tolerance, poor balance strategies and impaired cognition. Pt would benefit from continued acute PT services to maximize functional mobility and independence prior to d/c to SNF.       If plan is discharge home, recommend the following: A lot of help with walking and/or transfers;A lot of help with bathing/dressing/bathroom;Assistance with cooking/housework;Assist for transportation;Assistance with feeding;Direct supervision/assist for medications management;Direct supervision/assist for financial management   Can travel by private vehicle   No    Equipment Recommendations  (TBD - potential hospital bed, wheelchair, lift equipment)  Recommendations for Other Services   Occupational Therapy; Mobility Specialist   Functional Status Assessment Patient has had a recent decline in their functional  status and demonstrates the ability to make significant improvements in function in a reasonable and predictable amount of time.     Precautions / Restrictions Precautions Precautions: Fall;Other (comment) Recall of Precautions/Restrictions: Impaired Precaution/Restrictions Comments: watch vitals Required Braces or Orthoses: Splint/Cast Splint/Cast: L finger splint Restrictions Weight Bearing Restrictions Per Provider Order: Yes Other Position/Activity Restrictions: no formal orders, attempted to keep L hand NWB      Mobility  Bed Mobility Overal bed mobility: Needs Assistance Bed Mobility: Supine to Sit, Sit to Supine, Rolling Rolling: Min assist   Supine to sit: Mod assist, HOB elevated, Used rails Sit to supine: Mod assist   General bed mobility comments: modA for trunk elevation and LLE management, increased time and effort; assist to scoot L hip to EOB. minA to roll towards R-side with bed rail, pillow placed for partial R-sidelying and pressure relief to L hip pain    Transfers Overall transfer level: Needs assistance Equipment used: 1 person hand held assist Transfers: Sit to/from Stand Sit to Stand: Mod assist, Max assist, From elevated surface          Lateral/Scoot Transfers: Min assist General transfer comment: 3x sit<>stand from EOB with RUE HHA, mod-maxA for trunk elevation and stability; pt with difficulty initiating weight shifts to take side steps. lateral scoot along EOB with minA, though pt frequently pushing through L hand despite cues for NWB    Ambulation/Gait               General Gait Details: deferred secondary to fatigue post-standing and EOB activity; will require +2 assist  Stairs            Wheelchair Mobility     Tilt Bed    Modified Rankin (Stroke Patients Only)       Balance Overall balance assessment: Needs assistance Sitting-balance  support: No upper extremity supported, Feet supported Sitting balance-[name]:  Fair     Standing balance support: Single extremity supported, During functional activity Standing balance-[name]: Poor Standing balance comment: reliant on UE support and external assist                             Pertinent Vitals/Pain Pain Assessment Pain Assessment: Faces Faces Pain Scale: Hurts little more Pain Location: generalized, especially L hip and necl=k Pain Descriptors / Indicators: Discomfort, Grimacing, Guarding Pain Intervention(s): Monitored during session, Repositioned, Other (comment) (cervical/shoulder AROM)    Home Living Family/patient expects to be discharged to:: Skilled nursing facility                   Additional Comments: per family, recently transferred to LTC at [facility] from rehab    Prior Function Prior Level of Function : Needs assist;History of Falls (last six months)             Mobility Comments: was ambulating with assist and walker; has had multiple falls and worsening mobility. family reports working with a trainer last month for strengthening; prior to the past few months, pt very active ADLs Comments: staff assists with ADL/iADLs     Extremity/Trunk Assessment   Upper Extremity Assessment Upper Extremity Assessment: Generalized weakness (L finger splint and ace wrap to wrist/hand)    Lower Extremity Assessment Lower Extremity Assessment: RLE deficits/detail;LLE deficits/detail;Generalized weakness RLE Deficits / Details: initially stiff, though improving AROM after initial PROM/AAROM; gross hip strength > 3/5 through limited range, knee flex/ext 3/5 RLE Coordination: decreased gross motor LLE Deficits / Details: initially stiff, though improving AROM after initial PROM/AAROM; gross hip strength > 3/5 through limited range, limited hip flex <3/5 secondary to apparent L hip pain, knee flex/ext 3/5 LLE: Unable to fully assess due to pain LLE Coordination: decreased gross motor    Cervical /  Trunk Assessment Cervical / Trunk Assessment: Other exceptions Cervical / Trunk Exceptions: c/o neck pain with limited cervical AROM in all directions  Communication   Communication Communication: Impaired Factors Affecting Communication: Hearing impaired    Cognition Arousal: Alert Behavior During Therapy: WFL for tasks assessed/performed   PT - Cognitive impairments: History of cognitive impairments                       PT - Cognition Comments: pleasantly confused, disoriented. following simple commands well with increased cues secondary to [facility]; decreased awareness and attention requiring intermittent redirection Following commands: Intact       Cueing Cueing Techniques: Verbal cues, Gestural cues     General Comments General comments (skin integrity, edema, etc.): pt's daughter present and supportive. educ re: role of acute PT, POC, activity recommendations, importance of mobility and BUE/BLE AROM    Exercises Other Exercises Other Exercises: BUE AROM shoulder flex, abd, ER, horiz abd/add Other Exercises: BLE AAROM heel slides, SLR, SAQ; seated BLE AROM LAQ, ankle pumps Other Exercises: limited cervical AROM rotation, lateral flex, flex   Assessment/Plan    PT Assessment Patient needs continued PT services  PT Problem List Decreased strength;Decreased range of motion;Decreased activity tolerance;Decreased balance;Decreased mobility;Decreased cognition;Decreased knowledge of use of DME;Decreased knowledge of precautions;Cardiopulmonary status limiting activity;Pain       PT Treatment Interventions DME instruction;Gait training;Functional mobility training;Therapeutic activities;Therapeutic exercise;Balance training;Patient/family education;Wheelchair mobility training    PT Goals (Current goals can be found in the Care Plan section)  Acute Rehab PT Goals Patient Stated Goal: regain some strength PT Goal Formulation: With patient/family Time For Goal  Achievement: 01/12/24 Potential to Achieve Goals: Fair    Frequency Min 1X/week     Co-evaluation               AM-PAC PT "6 Clicks" Mobility  Outcome Measure Help needed turning from your back to your side while in a flat bed without using bedrails?: A Lot Help needed moving from lying on your back to sitting on the side of a flat bed without using bedrails?: A Lot Help needed moving to and from a bed to a chair (including a wheelchair)?: A Lot Help needed standing up from a chair using your arms (e.g., wheelchair or bedside chair)?: A Lot Help needed to walk in hospital room?: Total Help needed climbing 3-5 steps with a railing? : Total 6 Click Score: 10    End of Session Equipment Utilized During Treatment: Gait belt Activity Tolerance: Patient tolerated treatment well;Patient limited by fatigue Patient left: in bed;with call bell/phone within reach;with bed alarm set Nurse Communication: Mobility status PT Visit Diagnosis: Other abnormalities of gait and mobility (R26.89);Muscle weakness (generalized) (M62.81)    Time: 2956-2130 PT Time Calculation (min) (ACUTE ONLY): 29 min   Charges:   PT Evaluation $PT Eval Moderate Complexity: 1 Mod PT Treatments $Therapeutic Activity: 8-22 mins PT General Charges $$ ACUTE PT VISIT: 1 Visit       Blase Bur, PT, DPT Acute Rehabilitation Services  Personal: Secure Chat Rehab Office: [phone]  Dylan [name] 12/29/2023, 10:33 AM

## 2023-12-29 NOTE — Progress Notes (Signed)
 PROGRESS NOTE                                                                                                                                                                                                             Patient Demographics:    Dylan [name], is a 89 y.o. male, DOB - 04/23/1926, ZOX:[id number]  Outpatient Primary MD for the patient is Tisovec, Kristina Pfeiffer, MD    LOS - 2  Admit date - 12/26/2023    No chief complaint on file.      Brief Narrative (HPI from H&P)    89 y.o. male with medical history significant of A-fib on chronic anticoagulation with Eliquis , orthostatic hypotension on midodrine , diabetes mellitus, hypertension, CHF, tachybradycardia syndrome with junctional bradycardia, status post [employer] dual-chamber PPM, chronic diastolic CHF, CKD stage IIIa with baseline creatinine approximately 1, hyperlipidemia presented to the ED with confusion/altered mental status.  Patient noted to have been recently hospitalized 11/18/2023-11/21/2023 for frequent falls, UTI, dehydration.  Apparently he has been getting off-and-on confused at his assisted living facility for the first few days, and asked state of confusion he punched a wall and injured his left hand, he was found to be confused, he had evidence of left hand injury and fractures of the thumb and fourth digit, chest x-ray suggestive of aortic valve infection versus CHF, elevated total bilirubin, clinically appeared dehydrated with elevated lactic acid, there was question of SIRs from unclear source and he was admitted.   Subjective:   Patient in bed, no headache, no neck pain, no chest or abdominal pain, no throat discomfort, no chest or abdominal pain, muscle aches and pains much improved.   Assessment  & Plan :   Acute on chronic metabolic encephalopathy.  Likely developing dementia, he is having off-and-on bouts of confusion for the last few weeks, recently had  change of place which likely exacerbated his problems, there was also evidence of dehydration and SIRS at the time of admission.  Currently he will be treated with supportive care, avoid benzodiazepines and narcotics, nighttime Seroquel and monitor he likely is developing age-related cognitive decline.  He remains at risk for gradually worsening cognitive decline, aspiration risk.  Family told and understand.  Family explained that he is at risk for worsening mental status on a gradual and sustained basis.  Head CT is nonacute,  he has no headache or new focal deficits.  Stable TSH, stable B12, unremarkable RPR and HIV testing.  With supportive care improved, he likely is developing age-related cognitive decline with gradual but progressive chronic worsening.  SIRs - at the time of admission, chest x-ray suggestive of CHF versus atypical pneumonia, CT scan shows distended gallbladder with a possible gallstone, elevated total bilirubin, abdominal exam relatively benign.  For now we will place him on Unasyn and doxycycline covering for pneumonia and any underlying mild cholecystitis, follow cultures, respiratory viral panel negative, MRSA nasal PCR positive, HIDA scan -ve.  Clinically much improved and stable.  Distended gallbladder with gallstone, high total bilirubin.  HIDA scan pending, discussed with GI they have nothing to offer.  Mechanical injury with fracture of the left thumb and fourth digit.  Ortho input appreciated.  Dehydration with lactic acidosis.  Resolved with IV fluids, now has evidence of fluid overload, TED stockings and gentle diuresis.  Hypokalemia and hypophosphatemia.  Replaced.  Ongoing right shoulder, left hip and neck pain.  X-rays of the right shoulder and left hip nonacute, CT scan of the C-spine noted, has been seen by orthopedics, supportive care for his left hand injuries, discussed his next CT findings with ENT Dr. Virgia Griffins, no intervention needed, overall stable except for  mild neck pain, likely due to chronic arthritis, C-spine itself was nonacute, trial of low-dose steroids x 5 days and monitor.  Paroxysmal atrial fibrillation.  [place] vas 2 score of greater than 3.  Still cholecystitis ruled out switch Eliquis  to Lovenox , low-dose beta-blocker as tolerated, monitor on telemetry.  History of dysphagia.  On dysphagia 3 diet at ALF, speech input, at risk for ongoing aspiration.  Acute on chronic  diastolic CHF EF 60%.  Has evidence of fluid overload with some acute on chronic decompensation, gentle diuresis as tolerated by blood pressure.  Foley catheter placement in the ER.  Reason unclear, no documented urinary retention, Flomax Foley catheter discontinued 12/28/2023.  Monitor.  DM type II.  Diet controlled, A1c 5.9 recently.       Condition -   Guarded  Family Communication  :  son Georgette Kins [phone]  on 12/27/2023, 12/28/2023, daughter bedside 12/29/2023  Code Status :  DNR  Consults  :  Ortho, [facility] Dr. Karene Oto, [facility]NT Dr. Virgia Griffins  PUD Prophylaxis :  PPI   Procedures  :     HIDA scan.  Nonacute.  CT C-spine.  1. No acute cervical spine fracture or destructive process. 2. New mild retropharyngeal soft tissue swelling and/or fluid, indeterminate. This could be secondary to infection/inflammation of uncertain etiology or potentially related to generalized volume overload given partially visualized pleural effusions and body wall edema. No organized fluid collection. 3. Advanced cervical disc and facet degeneration with mild-to-moderate spinal stenosis and moderate to severe neural foraminal stenosis as above  RUQ - Distended gallbladder with a stone. No further sonographic evidence of acute cholecystitis at this time. No ductal dilatation. Right-sided pleural effusion  CT Abd Pelvis - Cardiomegaly, coronary artery disease.  Aortic atherosclerosis. Small bilateral effusions with compressive atelectasis in the lower lobes. Cholelithiasis.  No evidence for  acute cholecystitis. No acute findings in the abdomen or pelvis.  CT head - Non acute      Disposition Plan  :    Status is: Observation   DVT Prophylaxis  :    Place TED hose Start: 12/27/23 0814 / Lovenox     Lab Results  Component Value Date   PLT  169 12/29/2023    Diet :  Diet Order             DIET DYS 3 Room service appropriate? Yes; Fluid consistency: Nectar Thick  Diet effective now                    Inpatient Medications  Scheduled Meds:  (feeding supplement) PROSource Plus  30 mL Oral BID BM   Chlorhexidine  Gluconate Cloth  6 each Topical Daily   doxycycline  100 mg Oral Q12H   enoxaparin  (LOVENOX ) injection  110 mg Subcutaneous Q12H   fluticasone  2 spray Each Nare Daily   Gerhardt's butt cream   Topical BID   ipratropium  1 spray Each Nare BID   leptospermum manuka honey  1 Application Topical Daily   lidocaine   1 patch Transdermal Q24H   loratadine  10 mg Oral Daily   metoprolol  tartrate  25 mg Oral BID   mupirocin ointment  1 Application Nasal BID   pantoprazole  40 mg Oral Daily   QUEtiapine  25 mg Oral QHS   rosuvastatin   10 mg Oral Daily   senna  1 tablet Oral BID   sodium chloride  flush  3 mL Intravenous Q12H   tamsulosin  0.4 mg Oral Daily   Continuous Infusions:  ampicillin-sulbactam (UNASYN) IV 3 g (12/29/23 0248)   PRN Meds:.acetaminophen , hydrALAZINE , ketorolac , levalbuterol , metoprolol  tartrate, [DISCONTINUED] ondansetron  **OR** ondansetron  (ZOFRAN ) IV, polyethylene glycol, sorbitol     Objective:   Vitals:   12/28/23 2344 12/29/23 0424 12/29/23 0844 12/29/23 0850  BP:    107/76  Pulse:    93  Resp:  20    Temp: 97.6 F (36.4 C) 98.5 F (36.9 C)  (!) 97.3 F (36.3 C)  TempSrc: Axillary Axillary Oral Oral  SpO2:    (!) 89%  Weight:        Wt Readings from Last 3 Encounters:  12/28/23 105.7 kg  11/18/23 104.3 kg  11/14/23 104.3 kg     Intake/Output Summary (Last 24 hours) at 12/29/2023 0944 Last data filed at  12/29/2023 0506 Gross per 24 hour  Intake --  Output 900 ml  Net -900 ml     Physical Exam  Awake, minimally confused no new F.N deficits, Foley catheter in place, right upper extremity slightly weak due to pain in the right shoulder and guarding,  [place].AT,PERRAL Supple Neck, No JVD,   Symmetrical Chest wall movement, Good air movement bilaterally, CTAB RRR,No Gallops,Rubs or new Murmurs,  +ve B.Sounds, Abd Soft, No tenderness,   No Cyanosis, Clubbing or edema     RN pressure injury documentation: Pressure Injury 06/10/22 Buttocks Left;Lower;Anterior Stage 2 -  Partial thickness loss of dermis presenting as a shallow open injury with a red, pink wound bed without slough. small size of a pea (Active)  06/10/22 1849  Location: Buttocks  Location Orientation: Left;Lower;Anterior  Staging: Stage 2 -  Partial thickness loss of dermis presenting as a shallow open injury with a red, pink wound bed without slough.  Wound Description (Comments): small size of a pea  Present on Admission: Yes  Dressing Type Foam - Lift dressing to assess site every shift 12/29/23 0506      Data Review:    Recent Labs  Lab 12/26/23 1432 12/27/23 0547 12/28/23 0543 12/29/23 0541  WBC 10.9* 7.8 6.2 5.3  HGB 11.2* 10.8* 10.3* 11.2*  HCT 34.4* 33.6* 32.0* 34.6*  PLT 141* 110* 128* 169  MCV 100.3* 99.7 99.7  98.0  MCH 32.7 32.0 32.1 31.7  MCHC 32.6 32.1 32.2 32.4  RDW 14.4 14.4 14.2 13.9  LYMPHSABS 1.2 1.2 1.0 0.9  MONOABS 1.4* 0.7 0.6 0.5  EOSABS 0.0 0.1 0.1 0.0  BASOSABS 0.0 0.0 0.1 0.0    Recent Labs  Lab 12/26/23 1432 12/26/23 1438 12/26/23 1711 12/26/23 2136 12/27/23 0547 12/27/23 1005 12/28/23 0543 12/29/23 0541  NA 136  --   --   --  137  --  137 138  K 3.1*  --   --   --  3.4*  --  3.6 3.7  CL 100  --   --   --  102  --  101 104  CO2 25  --   --   --  25  --  26 26  ANIONGAP 11  --   --   --  10  --  10 8  GLUCOSE 139*  --   --   --  104*  --  103* 131*  BUN 10  --   --   --   10  --  13 14  CREATININE 0.71  --   --   --  0.77  --  0.80 0.82  AST 19  --   --   --  15  --   --   --   ALT 10  --   --   --  9  --   --   --   ALKPHOS 75  --   --   --  67  --   --   --   BILITOT 5.1*  --   --   --  3.8*  --   --   --   ALBUMIN 3.0*  --   --   --  2.6*  --   --   --   CRP  --   --   --   --  20.0*  --  15.3* 8.9*  PROCALCITON  --   --   --   --  0.10  --  0.10 <0.10  LATICACIDVEN  --  2.4* 3.5* 1.3  --   --   --   --   INR 2.2*  --   --   --   --   --   --   --   TSH  --   --   --   --   --  2.076  --   --   BNP  --   --   --   --  396.8*  --  513.0* 732.8*  MG 1.8  --   --   --  2.0  --  1.9 2.0  PHOS  --   --   --   --  2.4*  --   --   --   CALCIUM  8.9  --   --   --  8.6*  --  8.2* 8.5*      Recent Labs  Lab 12/26/23 1432 12/26/23 1438 12/26/23 1711 12/26/23 2136 12/27/23 0547 12/27/23 1005 12/28/23 0543 12/29/23 0541  CRP  --   --   --   --  20.0*  --  15.3* 8.9*  PROCALCITON  --   --   --   --  0.10  --  0.10 <0.10  LATICACIDVEN  --  2.4* 3.5* 1.3  --   --   --   --   INR 2.2*  --   --   --   --   --   --   --  TSH  --   --   --   --   --  2.076  --   --   BNP  --   --   --   --  396.8*  --  513.0* 732.8*  MG 1.8  --   --   --  2.0  --  1.9 2.0  CALCIUM  8.9  --   --   --  8.6*  --  8.2* 8.5*    --------------------------------------------------------------------------------------------------------------- Lab Results  Component Value Date   CHOL 111 09/16/2023   HDL 40 (L) 09/16/2023   LDLCALC 49 09/16/2023   TRIG 109 09/16/2023   CHOLHDL 2.8 09/16/2023    Lab Results  Component Value Date   HGBA1C 5.9 (H) 11/20/2023   Recent Labs    12/27/23 1005  TSH 2.076   Recent Labs    12/27/23 1005  VITAMINB12 298      Micro Results Recent Results (from the past 240 hours)  Blood Culture (routine x 2)     Status: None (Preliminary result)   Collection Time: 12/26/23  2:30 PM   Specimen: BLOOD RIGHT ARM  Result Value Ref Range  Status   Specimen Description BLOOD RIGHT ARM  Final   Special Requests   Final    BOTTLES DRAWN AEROBIC AND ANAEROBIC Blood Culture adequate volume   Culture   Final    NO GROWTH 3 DAYS Performed at [facility], [address] [place], [place] [address]    Report Status PENDING  Incomplete  Resp panel by RT-PCR (RSV, Flu A&B, Covid) Anterior Nasal Swab     Status: None   Collection Time: 12/26/23  2:32 PM   Specimen: Anterior Nasal Swab  Result Value Ref Range Status   SARS Coronavirus 2 by RT PCR NEGATIVE NEGATIVE Final   Influenza A by PCR NEGATIVE NEGATIVE Final   Influenza B by PCR NEGATIVE NEGATIVE Final    Comment: (NOTE) The Xpert Xpress SARS-CoV-2/FLU/RSV plus assay is intended as an aid in the diagnosis of influenza from Nasopharyngeal swab specimens and should not be used as a sole basis for treatment. Nasal washings and aspirates are unacceptable for Xpert Xpress SARS-CoV-2/FLU/RSV testing.  Fact Sheet for Patients: BloggerCourse.com  Fact Sheet for Healthcare Providers: SeriousBroker.it  This test is not yet approved or cleared by the [place]  FDA and has been authorized for detection and/or diagnosis of SARS-CoV-2 by FDA under an Emergency Use Authorization (EUA). This EUA will remain in effect (meaning this test can be used) for the duration of the COVID-19 declaration under Section 564(b)(1) of the Act, 21 U.S.C. section 360bbb-3(b)(1), unless the authorization is terminated or revoked.     Resp Syncytial Virus by PCR NEGATIVE NEGATIVE Final    Comment: (NOTE) Fact Sheet for Patients: BloggerCourse.com  Fact Sheet for Healthcare Providers: SeriousBroker.it  This test is not yet approved or cleared by the [place]  FDA and has been authorized for detection and/or diagnosis of SARS-CoV-2 by FDA under an Emergency Use Authorization (EUA). This  EUA will remain in effect (meaning this test can be used) for the duration of the COVID-19 declaration under Section 564(b)(1) of the Act, 21 U.S.C. section 360bbb-3(b)(1), unless the authorization is terminated or revoked.  Performed at [facility], [address], [place], [place] [address]   Blood Culture (routine x 2)     Status: None (Preliminary result)   Collection Time: 12/26/23  2:44 PM   Specimen: BLOOD RIGHT  ARM  Result Value Ref Range Status   Specimen Description BLOOD RIGHT ARM  Final   Special Requests   Final    BOTTLES DRAWN AEROBIC AND ANAEROBIC Blood Culture adequate volume   Culture   Final    NO GROWTH 3 DAYS Performed at [facility], [address], [place], [place] [address]    Report Status PENDING  Incomplete  MRSA Next Gen by PCR, Nasal     Status: Abnormal   Collection Time: 12/27/23  4:30 AM   Specimen: Nasal Mucosa; Nasal Swab  Result Value Ref Range Status   MRSA by PCR Next Gen DETECTED (A) NOT DETECTED Final    Comment: RESULT CALLED TO, READ BACK BY AND VERIFIED WITH: P BURTON,RN@[phone]  12/27/23 MK (NOTE) The GeneXpert MRSA Assay (FDA approved for NASAL specimens only), is one component of a comprehensive MRSA colonization surveillance program. It is not intended to diagnose MRSA infection nor to guide or monitor treatment for MRSA infections. Test performance is not FDA approved in patients less than 69 years old. Performed at [facility], [address] [place], [place] [address]   Respiratory (~20 pathogens) panel by PCR     Status: None   Collection Time: 12/27/23  8:42 AM   Specimen: Nasopharyngeal Swab; Respiratory  Result Value Ref Range Status   Adenovirus NOT DETECTED NOT DETECTED Final   Coronavirus 229E NOT DETECTED NOT DETECTED Final    Comment: (NOTE) The Coronavirus on the Respiratory Panel, DOES NOT test for the novel  Coronavirus (2019 nCoV)    Coronavirus HKU1 NOT DETECTED NOT DETECTED Final    Coronavirus NL63 NOT DETECTED NOT DETECTED Final   Coronavirus OC43 NOT DETECTED NOT DETECTED Final   Metapneumovirus NOT DETECTED NOT DETECTED Final   Rhinovirus / Enterovirus NOT DETECTED NOT DETECTED Final   Influenza A NOT DETECTED NOT DETECTED Final   Influenza B NOT DETECTED NOT DETECTED Final   Parainfluenza Virus 1 NOT DETECTED NOT DETECTED Final   Parainfluenza Virus 2 NOT DETECTED NOT DETECTED Final   Parainfluenza Virus 3 NOT DETECTED NOT DETECTED Final   Parainfluenza Virus 4 NOT DETECTED NOT DETECTED Final   Respiratory Syncytial Virus NOT DETECTED NOT DETECTED Final   Bordetella pertussis NOT DETECTED NOT DETECTED Final   Bordetella Parapertussis NOT DETECTED NOT DETECTED Final   Chlamydophila pneumoniae NOT DETECTED NOT DETECTED Final   Mycoplasma pneumoniae NOT DETECTED NOT DETECTED Final    Comment: Performed at [facility], [address], [place], [place] [address]    Radiology Report NM Hepatobiliary Liver Func Result Date: 12/28/2023 CLINICAL DATA:  Evaluate for scratch set biliary colic. Evaluate for cholecystitis. EXAM: NUCLEAR MEDICINE HEPATOBILIARY IMAGING TECHNIQUE: Sequential images of the abdomen were obtained out to 60 minutes following intravenous administration of radiopharmaceutical. RADIOPHARMACEUTICALS:  5.0 mCi Tc-76m  Choletec IV COMPARISON:  Right upper quadrant sonogram from 12/26/2023 FINDINGS: Prompt uptake and biliary excretion of activity by the liver is seen. Gallbladder activity is visualized, consistent with patency of cystic duct. Biliary activity passes into small bowel, consistent with patent common bile duct. IMPRESSION: 1. Normal exam. Electronically Signed   By: Kimberley Penman M.D.   On: 12/28/2023 12:25   CT CERVICAL SPINE W CONTRAST Result Date: 12/27/2023 CLINICAL DATA:  Chronic neck pain. EXAM: CT CERVICAL SPINE WITH CONTRAST TECHNIQUE: Multidetector CT imaging of the cervical spine was performed during intravenous contrast  administration. Multiplanar CT image reconstructions were also generated. RADIATION DOSE REDUCTION: This exam  was performed according to the departmental dose-optimization program which includes automated exposure control, adjustment of the mA and/or kV according to patient size and/or use of iterative reconstruction technique. CONTRAST:  75mL OMNIPAQUE  IOHEXOL  350 MG/ML SOLN COMPARISON:  CT cervical spine 12/05/2023 FINDINGS: Alignment: Unchanged reversal of the normal cervical lordosis and trace anterolisthesis of C3 on C4 and C4 on C5. Skull base and vertebrae: No acute fracture or suspicious lesion. Moderate atlantodental degenerative changes with ligamentous hypertrophy resulting in at least mild spinal stenosis. Soft tissues and spinal canal: New mild retropharyngeal soft tissue swelling and/or minimal fluid. No organized fluid collection. No visible spinal canal hematoma. Disc levels: Similar appearance of advanced mid and lower cervical disc degeneration and upper cervical facet arthrosis. Interbody ankylosis at C5-6 and C6-7. Relatively diffuse, mild cervical spinal canal stenosis with potentially moderate stenosis at C4-5. Moderate to severe neural foraminal stenosis bilaterally at C2-3 and on the right at C3-4 and C4-5. Upper chest: Partially visualized bilateral pleural effusions, increased from prior. Partially visualized body wall edema. Other: Major vessels of the neck are patent. Mild atherosclerosis at the right greater than left carotid bifurcations. IMPRESSION: 1. No acute cervical spine fracture or destructive process. 2. New mild retropharyngeal soft tissue swelling and/or fluid, indeterminate. This could be secondary to infection/inflammation of uncertain etiology or potentially related to generalized volume overload given partially visualized pleural effusions and body wall edema. No organized fluid collection. 3. Advanced cervical disc and facet degeneration with mild-to-moderate spinal  stenosis and moderate to severe neural foraminal stenosis as above. Electronically Signed   By: Aundra Lee M.D.   On: 12/27/2023 14:51     Signature  -   Lynnwood Sauer M.D on 12/29/2023 at 9:44 AM   -  To page go to [email or web address]

## 2023-12-29 NOTE — Consult Note (Addendum)
 Palliative Care Consult Note                                  Date: 12/29/2023   Patient Name: Dylan [name]  DOB: [birth year]  MRN: [id number]  Age / Sex: 89 y.o., male  PCP: Tisovec, Kristina Pfeiffer, MD Referring Physician: Cala Castleman, MD  Reason for Consultation: Establishing goals of care  HPI/Patient Profile: 89 y.o. male  with past medical history of A-fib on chronic anticoagulation with Eliquis , orthostatic hypotension on midodrine , diabetes mellitus, hypertension, CHF, tachybradycardia syndrome with junctional bradycardia, status post Medtronic dual-chamber PPM, chronic diastolic CHF, CKD stage IIIa with baseline creatinine approximately 1, hyperlipidemia presented to the ED admitted on 12/26/2023 with confusion/altered mental status and agitation punching a wall and sustaining hand pain/injury.   Workup revealed SIRS, acute metabolic encephalopathy, L 4th phalanx fx and possible thumb fx, gallbladder stone. Head CT negative for acute injury; chronic microvascular disease. Concern for CHF vs atypical PNA. Of note, recent admit 11/18/23-11/21/23 with falls, UTI, dehydration; ED admit 4/17 after fall hitting back of head.   Past Medical History:  Diagnosis Date   COVID-19    Diabetes mellitus without complication (HCC)    Dyspnea    History of hiatal hernia    Hyperglycemia    Hyperlipidemia    Hypertension    Lung nodule    Myocardial infarction [facility])    Presence of permanent cardiac pacemaker     Subjective:   I have reviewed medical records including EPIC notes, labs and imaging, assessed the patient and then spoke with his son Georgette Kins by phone to discuss diagnosis prognosis, GOC, EOL wishes, disposition and options.  I introduced Palliative Medicine as specialized medical care for people living with serious illness. It focuses on providing relief from symptoms and stress of a serious illness. The goal is to improve quality of life  for both the patient and the family.  Today's Discussion: Patient's son shared his understanding of the patient's chronic conditions and acute hospitalization. The patient has had several recent ED visits and hospitalizations. The patient has become increasingly confused and agitated. He punched a wall causing injuries before being admitted. He has also had worsening cognitive decline. We discussed his failure to thrive and small reserve.  Son shared the patient is married but his wife is recovering from a broken back. The patient's children are his decision makers. The patient was at [facility] prior to this admission.  A discussion was had today regarding advanced directives. Concepts specific to code status, artifical feeding and hydration, continued IV antibiotics and rehospitalization was had. Confirmed DNR/DNI status. Patient's son shares that the family wants the patient to have the best quality of life and he believes his dad is tired from the rehospitalizations. The difference between a aggressive medical intervention path and a palliative comfort care path for this patient at this time was had. We discussed what comfort measures could look like for this patient. The son shared that he ran into Dr. Mitzie Anda in the hallway and he suggested it may be time for the family to look into hospice. We discussed that he goal of hospice is the preservation of dignity and quality at the end phases of life. Under hospice care, the focus changes from curative to symptom relief. I explained the three settings where hospice services can be provided including the home, at a living facility (such as LTC  SNF, Assisted Living, etc), and a hospice facility. I explained that acceptance to hospice in any specific location is the final decision of the hospice medical director and bed availability, if applicable. Son stated that they will likely move towards comfort care. We also discussed options moving forward if they do not  proceed with hospice at this time like completing a MOST form outlining wishes if patient were to decline again after discharge. He would like to have a meeting with PMT and his sister Stephannie Ehlers to continue discussions.   Discussed the importance of continued conversation with family and the medical providers regarding overall plan of care and treatment options, ensuring decisions are within the context of the patient's values and GOCs.  Questions and concerns were addressed. The family was encouraged to call with questions or concerns. PMT will continue to support holistically.  Review of Systems  Unable to perform ROS   Objective:   Primary Diagnoses: Present on Admission:  SIRS (systemic inflammatory response syndrome) (HCC)  Benign essential HTN  DOE (dyspnea on exertion)  Dyslipidemia  Atrial fibrillation (HCC)  Chronic diastolic CHF (congestive heart failure) (HCC)  Type 2 diabetes mellitus with vascular disease (HCC)  Pure hypercholesterolemia  PVD (peripheral vascular disease) (HCC)  Hypokalemia  Orthostatic intolerance  Acute metabolic encephalopathy  Lactic acidosis  Dehydration  Hyperbilirubinemia  Thumb fracture  Closed fracture of distal phalanx of digit of left hand   Physical Exam Vitals reviewed.  Constitutional:      General: He is sleeping. He is not in acute distress.    Appearance: He is ill-appearing.  Cardiovascular:     Rate and Rhythm: Normal rate.  Pulmonary:     Effort: Pulmonary effort is normal.  Skin:    General: Skin is dry.     Vital Signs:  BP 112/76 (BP Location: Right Arm)   Pulse 75   Temp (!) 97.5 F (36.4 C) (Oral)   Resp 19   Wt 105.7 kg   SpO2 (!) 89%   BMI 31.60 kg/m    Advanced Care Planning:   Existing Vynca/ACP Documentation: DNR  Primary Decision Maker: HCPOA/patient's children  Code Status/Advance Care Planning: DNR   Assessment & Plan:   SUMMARY OF RECOMMENDATIONS   Continue DNR/DNI Plan for  family to meet with PMT Monday to discuss goals of care time TBD    Discussed with: Dr. Zelda Hickman  Time Total: 75 minutes    Thank you for allowing us  to participate in the care of SHARONE AGNE PMT will continue to support holistically.   Signed by: Joaquim Muir, NP Palliative Medicine Team  Team Phone # [phone] (Nights/Weekends)  12/29/2023, 5:17 PM

## 2023-12-30 DIAGNOSIS — I4891 Unspecified atrial fibrillation: Secondary | ICD-10-CM | POA: Diagnosis not present

## 2023-12-30 DIAGNOSIS — E876 Hypokalemia: Secondary | ICD-10-CM | POA: Diagnosis not present

## 2023-12-30 DIAGNOSIS — R6510 Systemic inflammatory response syndrome (SIRS) of non-infectious origin without acute organ dysfunction: Secondary | ICD-10-CM | POA: Diagnosis not present

## 2023-12-30 DIAGNOSIS — G9341 Metabolic encephalopathy: Secondary | ICD-10-CM | POA: Diagnosis not present

## 2023-12-30 DIAGNOSIS — I480 Paroxysmal atrial fibrillation: Secondary | ICD-10-CM | POA: Diagnosis not present

## 2023-12-30 DIAGNOSIS — R0609 Other forms of dyspnea: Secondary | ICD-10-CM

## 2023-12-30 DIAGNOSIS — R627 Adult failure to thrive: Secondary | ICD-10-CM

## 2023-12-30 DIAGNOSIS — Z515 Encounter for palliative care: Secondary | ICD-10-CM

## 2023-12-30 DIAGNOSIS — R52 Pain, unspecified: Secondary | ICD-10-CM | POA: Diagnosis not present

## 2023-12-30 DIAGNOSIS — I5043 Acute on chronic combined systolic (congestive) and diastolic (congestive) heart failure: Secondary | ICD-10-CM | POA: Diagnosis not present

## 2023-12-30 LAB — CBC WITH DIFFERENTIAL/PLATELET
Abs Immature Granulocytes: 0.02 10*3/uL (ref 0.00–0.07)
Basophils Absolute: 0 10*3/uL (ref 0.0–0.1)
Basophils Relative: 0 %
Eosinophils Absolute: 0 10*3/uL (ref 0.0–0.5)
Eosinophils Relative: 0 %
HCT: 33.7 % — ABNORMAL LOW (ref 39.0–52.0)
Hemoglobin: 10.7 g/dL — ABNORMAL LOW (ref 13.0–17.0)
Immature Granulocytes: 0 %
Lymphocytes Relative: 16 %
Lymphs Abs: 1 10*3/uL (ref 0.7–4.0)
MCH: 31.4 pg (ref 26.0–34.0)
MCHC: 31.8 g/dL (ref 30.0–36.0)
MCV: 98.8 fL (ref 80.0–100.0)
Monocytes Absolute: 0.5 10*3/uL (ref 0.1–1.0)
Monocytes Relative: 7 %
Neutro Abs: 4.9 10*3/uL (ref 1.7–7.7)
Neutrophils Relative %: 77 %
Platelets: 174 10*3/uL (ref 150–400)
RBC: 3.41 MIL/uL — ABNORMAL LOW (ref 4.22–5.81)
RDW: 13.7 % (ref 11.5–15.5)
WBC: 6.3 10*3/uL (ref 4.0–10.5)
nRBC: 0 % (ref 0.0–0.2)

## 2023-12-30 LAB — COMPREHENSIVE METABOLIC PANEL WITH GFR
ALT: 14 U/L (ref 0–44)
AST: 22 U/L (ref 15–41)
Albumin: 2.6 g/dL — ABNORMAL LOW (ref 3.5–5.0)
Alkaline Phosphatase: 69 U/L (ref 38–126)
Anion gap: 9 (ref 5–15)
BUN: 20 mg/dL (ref 8–23)
CO2: 26 mmol/L (ref 22–32)
Calcium: 8.8 mg/dL — ABNORMAL LOW (ref 8.9–10.3)
Chloride: 102 mmol/L (ref 98–111)
Creatinine, Ser: 0.93 mg/dL (ref 0.61–1.24)
GFR, Estimated: >60 mL/min (ref >60)
Glucose, Bld: 149 mg/dL — ABNORMAL HIGH (ref 70–99)
Potassium: 3.6 mmol/L (ref 3.5–5.1)
Sodium: 137 mmol/L (ref 135–145)
Total Bilirubin: 1.4 mg/dL — ABNORMAL HIGH (ref 0.0–1.2)
Total Protein: 5.8 g/dL — ABNORMAL LOW (ref 6.5–8.1)

## 2023-12-30 LAB — MAGNESIUM: Magnesium: 2 mg/dL (ref 1.7–2.4)

## 2023-12-30 MED ORDER — METHYLPREDNISOLONE SODIUM SUCC 40 MG IJ SOLR
20.0000 mg | INTRAMUSCULAR | Status: DC
Start: 2023-12-30 — End: 2023-12-30
  Administered 2023-12-30: 20 mg via INTRAVENOUS
  Filled 2023-12-30: qty 1

## 2023-12-30 NOTE — Plan of Care (Signed)

## 2023-12-30 NOTE — TOC Progression Note (Signed)
 Transition of Care [facility]) - Progression Note    Patient Details  Name: Dylan [name] MRN: [id number] Date of Birth: 1926-03-17  Transition of Care [facility] CM/SW Contact  Juliane Och, LCSW Phone Number: 12/30/2023, 12:15 PM  Clinical Narrative:     12:15 PM Per chart review, family is now interested in pursing palliative care and comfort care. GOC meeting to be conducted today. TOC will continue to follow.  Expected Discharge Plan: Skilled Nursing Facility Barriers to Discharge: Continued Medical Work up  Expected Discharge Plan and Services In-house Referral: Clinical Social Work   Post Acute Care Choice: Skilled Nursing Facility Living arrangements for the past 2 months: Skilled Nursing Facility                                       Social Determinants of Health (SDOH) Interventions SDOH Screenings   Food Insecurity: Patient Unable To Answer (12/26/2023)  Housing: Patient Unable To Answer (12/26/2023)  Transportation Needs: Patient Unable To Answer (12/26/2023)  Utilities: Patient Unable To Answer (12/26/2023)  Social Connections: Patient Declined (11/18/2023)  Tobacco Use: Medium Risk (12/26/2023)    Readmission Risk Interventions     No data to display

## 2023-12-30 NOTE — Progress Notes (Signed)
 Patient ID: Dylan [name], male   DOB: 05-28-1926, 89 y.o.   MRN: [id number]    Progress Note from the Palliative Medicine Team at [facility]   Patient Name: Dylan [name]        Date: 12/30/2023 DOB: [birth year]  Age: 89 y.o. MRN#: [id number] Attending Physician: Cala Castleman, MD Primary Care Physician: Tisovec, Richard W, MD Admit Date: 12/26/2023   Reason for Consultation/Follow-up   Establishing Goals of Care   HPI/ Brief Hospital Review  89 y.o. male  with past medical history of A-fib on chronic anticoagulation with Eliquis , orthostatic hypotension on midodrine , diabetes mellitus, hypertension, CHF, tachybradycardia syndrome with junctional bradycardia, status post Medtronic dual-chamber PPM, chronic diastolic CHF, CKD stage IIIa with baseline creatinine approximately 1, hyperlipidemia presented to the ED admitted on 12/26/2023 with confusion/altered mental status and agitation punching a wall and sustaining hand pain/injury.    Workup revealed SIRS, acute metabolic encephalopathy, L 4th phalanx fx and possible thumb fx, gallbladder stone. Head CT negative for acute injury; chronic microvascular disease. Concern for CHF vs atypical PNA. Of note, recent admit 11/18/23-11/21/23 with falls, UTI, dehydration; ED admit 4/17 after fall hitting back of head.   Family face treatment option decisions, advanced directive decisions and anticipatory care needs.   Subjective  Extensive chart review has been completed prior to meeting with patient/family  including labs, vital signs, imaging, progress/consult notes, orders, medications and available advance directive documents.    This NP assessed patient at the bedside as a follow up to  yesterday's GOCs meeting, for palliative medicine needs, emotional support and to meet with family as scheduled for ongoing conversation regarding plan of care.  Patient arouses to verbal and tactile stimuli, he is intermittently confused.   Patient's son  and daughter at bedside.   Family recognizes continued physical, functional and cognitive decline over the past 3 months.  Education offered on current medical situation specific to multiple co-morbidities.  Education offered on concept specific to adult failure to thrive and the limitations of medical interventions to prolong quality of life when the body does fail to thrive.  Education offered on the difference between a full medical support path attempting to prolong life versus a palliative comfort path focusing on comfort and dignity allowing for natural death.  Education offered on the natural trajectory and expectations at end-of-life.  Education offered on hospice benefit; philosophy and eligibility.  Family make decision to forego life-prolonging measures, focus on comfort and dignity allowing for natural death.  Plan of care - DNR/DNI - No artificial feeding or hydration now or in the future/offered feeds as tolerated with known risk of aspiration - No further diagnostics; scans, labs, telemetry - No further IV antibiotics - Symptom management        - Pain (patient complains of generalized pain more localized in neck and left hip and left hand)       -  Tylenol /Toradol  - Reassess in 24 to 48 hours for appropriateness for inpatient hospice facility   MOST form completed to reflect full comfort  Education offered today regarding  the importance of continued conversation with family and their  medical providers regarding overall plan of care and treatment options,  ensuring decisions are within the context of the patients values and GOCs.  Questions and concerns addressed   Discussed with primary team and nursing staff  PMT will continue to support holistically.   Time: 65    minutes  Discussed with Dr. Zelda Hickman and bedside  RN  Detailed review of medical records ( labs, imaging, vital signs), medically appropriate exam ( MS, skin, cardiac,  resp)   discussed with treatment  team, counseling and education to patient, family, staff, documenting clinical information, medication management, coordination of care    Thena Fireman NP  Palliative Medicine Team Team Phone # [phone] Pager [phone]

## 2023-12-30 NOTE — Care Management Important Message (Signed)
 Important Message  Patient Details  Name: Dylan [name] MRN: [id number] Date of Birth: 04-17-1926   Important Message Given:  Yes - Medicare IM     Wynonia Hedges 12/30/2023, 3:16 PM

## 2023-12-30 NOTE — Plan of Care (Signed)
   Problem: Activity: Goal: Risk for activity intolerance will decrease Outcome: Progressing   Problem: Nutrition: Goal: Adequate nutrition will be maintained Outcome: Progressing   Problem: Coping: Goal: Level of anxiety will decrease Outcome: Progressing   Problem: Pain Managment: Goal: General experience of comfort will improve and/or be controlled Outcome: Progressing

## 2023-12-30 NOTE — Progress Notes (Signed)
 PROGRESS NOTE                                                                                                                                                                                                             Patient Demographics:    Dylan [name], is a 89 y.o. male, DOB - [birth year], ZOX:[id number]  Outpatient Primary MD for the patient is Tisovec, Kristina Pfeiffer, MD    LOS - 3  Admit date - 12/26/2023    No chief complaint on file.      Brief Narrative (HPI from H&P)    89 y.o. male with medical history significant of A-fib on chronic anticoagulation with Eliquis , orthostatic hypotension on midodrine , diabetes mellitus, hypertension, CHF, tachybradycardia syndrome with junctional bradycardia, status post Medtronic dual-chamber PPM, chronic diastolic CHF, CKD stage IIIa with baseline creatinine approximately 1, hyperlipidemia presented to the ED with confusion/altered mental status.  Patient noted to have been recently hospitalized 11/18/2023-11/21/2023 for frequent falls, UTI, dehydration.  Apparently he has been getting off-and-on confused at his assisted living facility for the first few days, and asked state of confusion he punched a wall and injured his left hand, he was found to be confused, he had evidence of left hand injury and fractures of the thumb and fourth digit, chest x-ray suggestive of aortic valve infection versus CHF, elevated total bilirubin, clinically appeared dehydrated with elevated lactic acid, there was question of SIRs from unclear source and he was admitted.   Subjective:   Patient in bed, appears comfortable, denies any headache, no fever, no chest pain or pressure, no shortness of breath , no abdominal pain. No focal weakness.  Overall remains confused.   Assessment  & Plan :   Note family now interested in pursuing palliative measures and comfort, palliative care meeting today.  DNR.  Goals of care to  be decided soon.      Acute on chronic metabolic encephalopathy.  Likely developing dementia, he is having off-and-on bouts of confusion for the last few weeks, recently had change of place which likely exacerbated his problems, there was also evidence of dehydration and SIRS at the time of admission.  Currently he will be treated with supportive care, avoid benzodiazepines and narcotics, nighttime Seroquel and monitor he likely is developing age-related cognitive decline.  He remains at risk for gradually worsening  cognitive decline, aspiration risk.  Family told and understand.  Family explained that he is at risk for worsening mental status on a gradual and sustained basis.  Head CT is nonacute, he has no headache or new focal deficits.  Stable TSH, stable B12, unremarkable RPR and HIV testing.  With supportive care improved, he likely is developing age-related cognitive decline with gradual but progressive chronic worsening.  SIRs - at the time of admission, chest x-ray suggestive of CHF versus atypical pneumonia, CT scan shows distended gallbladder with a possible gallstone, elevated total bilirubin, abdominal exam relatively benign.  For now we will place him on Unasyn and doxycycline covering for pneumonia and any underlying mild cholecystitis, follow cultures, respiratory viral panel negative, MRSA nasal PCR positive, HIDA scan -ve.  Clinically much improved and stable.  Distended gallbladder with gallstone, high total bilirubin.  HIDA scan pending, discussed with GI they have nothing to offer.  Mechanical injury with fracture of the left thumb and fourth digit.  Ortho input appreciated.  Dehydration with lactic acidosis.  Resolved with IV fluids, now has evidence of fluid overload, TED stockings and gentle diuresis.  Hypokalemia and hypophosphatemia.  Replaced.  Ongoing right shoulder, left hip and neck pain.  X-rays of the right shoulder and left hip nonacute, CT scan of the C-spine  noted, has been seen by orthopedics, supportive care for his left hand injuries, discussed his next CT findings with ENT Dr. Virgia Griffins, no intervention needed, overall stable except for mild neck pain, likely due to chronic arthritis, C-spine itself was nonacute, trial of low-dose steroids x 5 days and monitor.  Paroxysmal atrial fibrillation.  [place] vas 2 score of greater than 3.  Still cholecystitis ruled out switch Eliquis  to Lovenox , low-dose beta-blocker as tolerated, monitor on telemetry.  History of dysphagia.  On dysphagia 3 diet at ALF, speech input, at risk for ongoing aspiration.  Acute on chronic  diastolic CHF EF 60%.  Has evidence of fluid overload with some acute on chronic decompensation, gentle diuresis as tolerated by blood pressure.  Foley catheter placement in the ER.  Reason unclear, no documented urinary retention, Flomax Foley catheter discontinued 12/28/2023.  Monitor.  DM type II.  Diet controlled, A1c 5.9 recently.       Condition -   Guarded  Family Communication  :    son Georgette Kins [phone]  on 12/27/2023, 12/28/2023, 12/30/2023 message left at 7:41 AM.    Daughter [name] [phone] bedside 12/29/2023  Code Status :  DNR  Consults  :  Amos Balint, [facility] GI Dr. Karene Oto, [facility] ENT Dr. Virgia Griffins  PUD Prophylaxis :  PPI   Procedures  :     HIDA scan.  Nonacute.  CT C-spine.  1. No acute cervical spine fracture or destructive process. 2. New mild retropharyngeal soft tissue swelling and/or fluid, indeterminate. This could be secondary to infection/inflammation of uncertain etiology or potentially related to generalized volume overload given partially visualized pleural effusions and body wall edema. No organized fluid collection. 3. Advanced cervical disc and facet degeneration with mild-to-moderate spinal stenosis and moderate to severe neural foraminal stenosis as above  RUQ - Distended gallbladder with a stone. No further sonographic evidence of acute cholecystitis at this time.  No ductal dilatation. Right-sided pleural effusion  CT Abd Pelvis - Cardiomegaly, coronary artery disease.  Aortic atherosclerosis. Small bilateral effusions with compressive atelectasis in the lower lobes. Cholelithiasis.  No evidence for acute cholecystitis. No acute findings in the abdomen or pelvis.  CT head - Non  acute      Disposition Plan  :    Status is: Inpt  DVT Prophylaxis  :    Place TED hose Start: 12/27/23 0814 / Lovenox     Lab Results  Component Value Date   PLT 174 12/30/2023    Diet :  Diet Order             DIET DYS 3 Room service appropriate? Yes; Fluid consistency: Nectar Thick  Diet effective now                    Inpatient Medications  Scheduled Meds:  (feeding supplement) PROSource Plus  30 mL Oral BID BM   Chlorhexidine  Gluconate Cloth  6 each Topical Daily   doxycycline  100 mg Oral Q12H   enoxaparin  (LOVENOX ) injection  110 mg Subcutaneous Q12H   fluticasone  2 spray Each Nare Daily   Gerhardt's butt cream   Topical BID   ipratropium  1 spray Each Nare BID   leptospermum manuka honey  1 Application Topical Daily   lidocaine   1 patch Transdermal Q24H   loratadine  10 mg Oral Daily   methylPREDNISolone  (SOLU-MEDROL ) injection  40 mg Intravenous Q24H   metoprolol  tartrate  25 mg Oral BID   mupirocin ointment  1 Application Nasal BID   pantoprazole  40 mg Oral Daily   QUEtiapine  25 mg Oral QHS   rosuvastatin   10 mg Oral Daily   senna  1 tablet Oral BID   sodium chloride  flush  3 mL Intravenous Q12H   tamsulosin  0.4 mg Oral Daily   Continuous Infusions:  ampicillin-sulbactam (UNASYN) IV 3 g (12/30/23 0340)   PRN Meds:.acetaminophen , hydrALAZINE , ketorolac , levalbuterol , metoprolol  tartrate, [DISCONTINUED] ondansetron  **OR** ondansetron  (ZOFRAN ) IV, polyethylene glycol, sorbitol     Objective:   Vitals:   12/29/23 1544 12/29/23 1600 12/30/23 0000 12/30/23 0339  BP: 112/76 100/62 (!) 133/111 (!) 153/101  Pulse: 75 69  (!) 105   Resp:  17 15 20   Temp: (!) 97.5 F (36.4 C) 97.7 F (36.5 C)  (!) 97.5 F (36.4 C)  TempSrc: Oral Oral  Oral  SpO2: (!) 89% 93%  96%  Weight:        Wt Readings from Last 3 Encounters:  12/28/23 105.7 kg  11/18/23 104.3 kg  11/14/23 104.3 kg     Intake/Output Summary (Last 24 hours) at 12/30/2023 0739 Last data filed at 12/29/2023 1737 Gross per 24 hour  Intake --  Output 300 ml  Net -300 ml     Physical Exam  Awake, minimally confused no new F.N deficits, Foley catheter in place, right upper extremity slightly weak due to pain in the right shoulder and guarding, left hand has a hematoma at the site of previous injury from punching the wall Jaconita.AT,PERRAL Supple Neck, No JVD,   Symmetrical Chest wall movement, Good air movement bilaterally, CTAB RRR,No Gallops,Rubs or new Murmurs,  +ve B.Sounds, Abd Soft, No tenderness,   No Cyanosis, Clubbing or edema     RN pressure injury documentation: Pressure Injury 06/10/22 Buttocks Left;Lower;Anterior Stage 2 -  Partial thickness loss of dermis presenting as a shallow open injury with a red, pink wound bed without slough. small size of a pea (Active)  06/10/22 1849  Location: Buttocks  Location Orientation: Left;Lower;Anterior  Staging: Stage 2 -  Partial thickness loss of dermis presenting as a shallow open injury with a red, pink wound bed without slough.  Wound Description (Comments): small size of  a pea  Present on Admission: Yes  Dressing Type Foam - Lift dressing to assess site every shift 12/29/23 1737      Data Review:    Recent Labs  Lab 12/26/23 1432 12/27/23 0547 12/28/23 0543 12/29/23 0541 12/30/23 0428  WBC 10.9* 7.8 6.2 5.3 6.3  HGB 11.2* 10.8* 10.3* 11.2* 10.7*  HCT 34.4* 33.6* 32.0* 34.6* 33.7*  PLT 141* 110* 128* 169 174  MCV 100.3* 99.7 99.7 98.0 98.8  MCH 32.7 32.0 32.1 31.7 31.4  MCHC 32.6 32.1 32.2 32.4 31.8  RDW 14.4 14.4 14.2 13.9 13.7  LYMPHSABS 1.2 1.2 1.0 0.9 1.0  MONOABS 1.4* 0.7 0.6  0.5 0.5  EOSABS 0.0 0.1 0.1 0.0 0.0  BASOSABS 0.0 0.0 0.1 0.0 0.0    Recent Labs  Lab 12/26/23 1432 12/26/23 1438 12/26/23 1711 12/26/23 2136 12/27/23 0547 12/27/23 1005 12/28/23 0543 12/29/23 0541 12/30/23 0428  NA 136  --   --   --  137  --  137 138 137  K 3.1*  --   --   --  3.4*  --  3.6 3.7 3.6  CL 100  --   --   --  102  --  101 104 102  CO2 25  --   --   --  25  --  26 26 26   ANIONGAP 11  --   --   --  10  --  10 8 9   GLUCOSE 139*  --   --   --  104*  --  103* 131* 149*  BUN 10  --   --   --  10  --  13 14 20   CREATININE 0.71  --   --   --  0.77  --  0.80 0.82 0.93  AST 19  --   --   --  15  --   --   --  22  ALT 10  --   --   --  9  --   --   --  14  ALKPHOS 75  --   --   --  67  --   --   --  69  BILITOT 5.1*  --   --   --  3.8*  --   --   --  1.4*  ALBUMIN 3.0*  --   --   --  2.6*  --   --   --  2.6*  CRP  --   --   --   --  20.0*  --  15.3* 8.9*  --   PROCALCITON  --   --   --   --  0.10  --  0.10 <0.10  --   LATICACIDVEN  --  2.4* 3.5* 1.3  --   --   --   --   --   INR 2.2*  --   --   --   --   --   --   --   --   TSH  --   --   --   --   --  2.076  --   --   --   BNP  --   --   --   --  396.8*  --  513.0* 732.8*  --   MG 1.8  --   --   --  2.0  --  1.9 2.0 2.0  PHOS  --   --   --   --  2.4*  --   --   --   --   CALCIUM  8.9  --   --   --  8.6*  --  8.2* 8.5* 8.8*      Recent Labs  Lab 12/26/23 1432 12/26/23 1438 12/26/23 1711 12/26/23 2136 12/27/23 0547 12/27/23 1005 12/28/23 0543 12/29/23 0541 12/30/23 0428  CRP  --   --   --   --  20.0*  --  15.3* 8.9*  --   PROCALCITON  --   --   --   --  0.10  --  0.10 <0.10  --   LATICACIDVEN  --  2.4* 3.5* 1.3  --   --   --   --   --   INR 2.2*  --   --   --   --   --   --   --   --   TSH  --   --   --   --   --  2.076  --   --   --   BNP  --   --   --   --  396.8*  --  513.0* 732.8*  --   MG 1.8  --   --   --  2.0  --  1.9 2.0 2.0  CALCIUM  8.9  --   --   --  8.6*  --  8.2* 8.5* 8.8*     --------------------------------------------------------------------------------------------------------------- Lab Results  Component Value Date   CHOL 111 09/16/2023   HDL 40 (L) 09/16/2023   LDLCALC 49 09/16/2023   TRIG 109 09/16/2023   CHOLHDL 2.8 09/16/2023    Lab Results  Component Value Date   HGBA1C 5.9 (H) 11/20/2023   Recent Labs    12/27/23 1005  TSH 2.076   Recent Labs    12/27/23 1005  VITAMINB12 298      Micro Results Recent Results (from the past 240 hours)  Blood Culture (routine x 2)     Status: None (Preliminary result)   Collection Time: 12/26/23  2:30 PM   Specimen: BLOOD RIGHT ARM  Result Value Ref Range Status   Specimen Description BLOOD RIGHT ARM  Final   Special Requests   Final    BOTTLES DRAWN AEROBIC AND ANAEROBIC Blood Culture adequate volume   Culture   Final    NO GROWTH 3 DAYS Performed at [facility], [address] [place], [place] [address]    Report Status PENDING  Incomplete  Resp panel by RT-PCR (RSV, Flu A&B, Covid) Anterior Nasal Swab     Status: None   Collection Time: 12/26/23  2:32 PM   Specimen: Anterior Nasal Swab  Result Value Ref Range Status   SARS Coronavirus 2 by RT PCR NEGATIVE NEGATIVE Final   Influenza A by PCR NEGATIVE NEGATIVE Final   Influenza B by PCR NEGATIVE NEGATIVE Final    Comment: (NOTE) The Xpert Xpress SARS-CoV-2/FLU/RSV plus assay is intended as an aid in the diagnosis of influenza from Nasopharyngeal swab specimens and should not be used as a sole basis for treatment. Nasal washings and aspirates are unacceptable for Xpert Xpress SARS-CoV-2/FLU/RSV testing.  Fact Sheet for Patients: BloggerCourse.com  Fact Sheet for Healthcare Providers: SeriousBroker.it  This test is not yet approved or cleared by the [place]  FDA and has been authorized for detection and/or diagnosis of SARS-CoV-2 by FDA under an Emergency Use  Authorization (EUA). This EUA will remain in effect (meaning this test can be  used) for the duration of the COVID-19 declaration under Section 564(b)(1) of the Act, 21 U.S.C. section 360bbb-3(b)(1), unless the authorization is terminated or revoked.     Resp Syncytial Virus by PCR NEGATIVE NEGATIVE Final    Comment: (NOTE) Fact Sheet for Patients: BloggerCourse.com  Fact Sheet for Healthcare Providers: SeriousBroker.it  This test is not yet approved or cleared by the [place]  FDA and has been authorized for detection and/or diagnosis of SARS-CoV-2 by FDA under an Emergency Use Authorization (EUA). This EUA will remain in effect (meaning this test can be used) for the duration of the COVID-19 declaration under Section 564(b)(1) of the Act, 21 U.S.C. section 360bbb-3(b)(1), unless the authorization is terminated or revoked.  Performed at [facility], [address], [place], [place] [address]   Blood Culture (routine x 2)     Status: None (Preliminary result)   Collection Time: 12/26/23  2:44 PM   Specimen: BLOOD RIGHT ARM  Result Value Ref Range Status   Specimen Description BLOOD RIGHT ARM  Final   Special Requests   Final    BOTTLES DRAWN AEROBIC AND ANAEROBIC Blood Culture adequate volume   Culture   Final    NO GROWTH 3 DAYS Performed at [facility], [address] [place], [place] [address]    Report Status PENDING  Incomplete  MRSA Next Gen by PCR, Nasal     Status: Abnormal   Collection Time: 12/27/23  4:30 AM   Specimen: Nasal Mucosa; Nasal Swab  Result Value Ref Range Status   MRSA by PCR Next Gen DETECTED (A) NOT DETECTED Final    Comment: RESULT CALLED TO, READ BACK BY AND VERIFIED WITH: P BURTON,RN@0643  12/27/23 MK (NOTE) The GeneXpert MRSA Assay (FDA approved for NASAL specimens only), is one component of a comprehensive MRSA colonization surveillance program. It is not intended to  diagnose MRSA infection nor to guide or monitor treatment for MRSA infections. Test performance is not FDA approved in patients less than 41 years old. Performed at [facility], [address] [place], [place] [address]   Respiratory (~20 pathogens) panel by PCR     Status: None   Collection Time: 12/27/23  8:42 AM   Specimen: Nasopharyngeal Swab; Respiratory  Result Value Ref Range Status   Adenovirus NOT DETECTED NOT DETECTED Final   Coronavirus 229E NOT DETECTED NOT DETECTED Final    Comment: (NOTE) The Coronavirus on the Respiratory Panel, DOES NOT test for the novel  Coronavirus (2019 nCoV)    Coronavirus HKU1 NOT DETECTED NOT DETECTED Final   Coronavirus NL63 NOT DETECTED NOT DETECTED Final   Coronavirus OC43 NOT DETECTED NOT DETECTED Final   Metapneumovirus NOT DETECTED NOT DETECTED Final   Rhinovirus / Enterovirus NOT DETECTED NOT DETECTED Final   Influenza A NOT DETECTED NOT DETECTED Final   Influenza B NOT DETECTED NOT DETECTED Final   Parainfluenza Virus 1 NOT DETECTED NOT DETECTED Final   Parainfluenza Virus 2 NOT DETECTED NOT DETECTED Final   Parainfluenza Virus 3 NOT DETECTED NOT DETECTED Final   Parainfluenza Virus 4 NOT DETECTED NOT DETECTED Final   Respiratory Syncytial Virus NOT DETECTED NOT DETECTED Final   Bordetella pertussis NOT DETECTED NOT DETECTED Final   Bordetella Parapertussis NOT DETECTED NOT DETECTED Final   Chlamydophila pneumoniae NOT DETECTED NOT DETECTED Final   Mycoplasma pneumoniae NOT DETECTED NOT DETECTED Final    Comment: Performed at [facility], [address] [place], [place] [address]  Radiology Report NM Hepatobiliary Liver Func Result Date: 12/28/2023 CLINICAL DATA:  Evaluate for scratch set biliary colic. Evaluate for cholecystitis. EXAM: NUCLEAR MEDICINE HEPATOBILIARY IMAGING TECHNIQUE: Sequential images of the abdomen were obtained out to 60 minutes following intravenous administration of radiopharmaceutical.  RADIOPHARMACEUTICALS:  5.0 mCi Tc-16m  Choletec IV COMPARISON:  Right upper quadrant sonogram from 12/26/2023 FINDINGS: Prompt uptake and biliary excretion of activity by the liver is seen. Gallbladder activity is visualized, consistent with patency of cystic duct. Biliary activity passes into small bowel, consistent with patent common bile duct. IMPRESSION: 1. Normal exam. Electronically Signed   By: Kimberley Penman M.D.   On: 12/28/2023 12:25     Signature  -   Lynnwood Sauer M.D on 12/30/2023 at 7:39 AM   -  To page go to [email or web address]

## 2023-12-31 DIAGNOSIS — R6510 Systemic inflammatory response syndrome (SIRS) of non-infectious origin without acute organ dysfunction: Secondary | ICD-10-CM | POA: Diagnosis not present

## 2023-12-31 DIAGNOSIS — R1310 Dysphagia, unspecified: Secondary | ICD-10-CM | POA: Diagnosis not present

## 2023-12-31 DIAGNOSIS — S62639A Displaced fracture of distal phalanx of unspecified finger, initial encounter for closed fracture: Secondary | ICD-10-CM | POA: Diagnosis not present

## 2023-12-31 DIAGNOSIS — Z515 Encounter for palliative care: Secondary | ICD-10-CM | POA: Diagnosis not present

## 2023-12-31 DIAGNOSIS — I5032 Chronic diastolic (congestive) heart failure: Secondary | ICD-10-CM | POA: Diagnosis not present

## 2023-12-31 LAB — CULTURE, BLOOD (ROUTINE X 2)
Culture: NO GROWTH
Culture: NO GROWTH
Special Requests: ADEQUATE
Special Requests: ADEQUATE

## 2023-12-31 MED ORDER — MORPHINE SULFATE (CONCENTRATE) 10 MG /0.5 ML PO SOLN
5.0000 mg | Freq: Four times a day (QID) | ORAL | Status: DC
  Administered 2023-12-31 – 2024-01-01 (×5): 5 mg via ORAL
  Filled 2023-12-31 (×5): qty 0.5

## 2023-12-31 MED ORDER — HYDROCODONE-ACETAMINOPHEN 5-325 MG PO TABS: 1.0000 | ORAL_TABLET | Freq: Four times a day (QID) | ORAL | Status: DC | PRN

## 2023-12-31 NOTE — Plan of Care (Signed)

## 2023-12-31 NOTE — Progress Notes (Signed)
 Patient ID: Dylan [name], male   DOB: 10-18-1925, 89 y.o.   MRN: [id number]    Progress Note from the Palliative Medicine Team at [facility]   Patient Name: Dylan [name]        Date: 12/31/2023 DOB: 07-Feb-1926  Age: 89 y.o. MRN#: [id number] Attending Physician: Cala Castleman, MD Primary Care Physician: Tisovec, Richard W, MD Admit Date: 12/26/2023   Reason for Consultation/Follow-up   Establishing Goals of Care   HPI/ Brief Hospital Review  89 y.o. male  with past medical history of A-fib on chronic anticoagulation with Eliquis , orthostatic hypotension on midodrine , diabetes mellitus, hypertension, CHF, tachybradycardia syndrome with junctional bradycardia, status post Medtronic dual-chamber PPM, chronic diastolic CHF, CKD stage IIIa with baseline creatinine approximately 1, hyperlipidemia presented to the ED admitted on 12/26/2023 with confusion/altered mental status and agitation punching a wall and sustaining hand pain/injury.    Workup revealed SIRS, acute metabolic encephalopathy, L 4th phalanx fx and possible thumb fx, gallbladder stone. Head CT negative for acute injury; chronic microvascular disease. Concern for CHF vs atypical PNA. Of note, recent admit 11/18/23-11/21/23 with falls, UTI, dehydration; ED admit 4/17 after fall hitting back of head.   Family face treatment option decisions, advanced directive decisions and anticipatory care needs.   Subjective  Extensive chart review has been completed prior to meeting with patient/family  including labs, vital signs, imaging, progress/consult notes, orders, medications and available advance directive documents.    This NP assessed patient at the bedside as a follow up for palliative medicine needs and emotional support.  Patient did not arouse to gentle touch and verbal stimuli, he appears to be resting comfortable .  Patient is sleepier today, with decreased oral intake.  No family at bedside.            Yesterday  decision was made to shift focus of care to comfort and dignity allowing for natural death.  Plan of care -DNR/DNI - No artificial feeding or hydration now or in the future, comfort feeds with known risk of aspiration - No further diagnostics; labs,  scans - No further IV antibiotics -Symptom management     -Added Roxanol 5 mg p.o./sublingual every 6 hours scheduled for underlying chronic pain - Assess in the morning for next steps in transition of care-family is hopeful for inpatient hospice  I spoke to patient's son by phone.  Ongoing conversation regarding current medical situation,  education offered on hospice benefit  Education offered on the natural trajectory and expectations at end-of-life.  MOST form completed to reflect full comfort   Questions and concerns addressed   Discussed with primary team and nursing staff via secure chat  PMT will continue to support holistically.   Time: 50    minutes  Detailed review of medical records ( labs, imaging, vital signs), medically appropriate exam ( MS, skin, cardiac,  resp)   discussed with treatment team, counseling and education to patient, family, staff, documenting clinical information, medication management, coordination of care    Thena Fireman NP  Palliative Medicine Team Team Phone # [phone] Pager [phone]

## 2023-12-31 NOTE — TOC Progression Note (Signed)
 Transition of Care [facility]) - Progression Note    Patient Details  Name: KAHLIF BERKMAN MRN: [id number] Date of Birth: 1926/04/19  [facility] CM/SW Contact  Jannice Mends, LCSW Phone Number: 12/31/2023, 3:53 PM  Clinical Narrative:    Case discussed with Palliative Care. CSW continuing to follow.    Expected Discharge Plan: Skilled Nursing Facility Barriers to Discharge: Continued Medical Work up  Expected Discharge Plan and Services In-house Referral: Clinical Social Work   Post Acute Care Choice: Skilled Nursing Facility Living arrangements for the past 2 months: Skilled Nursing Facility                                       Social Determinants of Health (SDOH) Interventions SDOH Screenings   Food Insecurity: Patient Unable To Answer (12/26/2023)  Housing: Patient Unable To Answer (12/26/2023)  Transportation Needs: Patient Unable To Answer (12/26/2023)  Utilities: Patient Unable To Answer (12/26/2023)  Social Connections: Patient Declined (11/18/2023)  Tobacco Use: Medium Risk (12/26/2023)    Readmission Risk Interventions     No data to display

## 2023-12-31 NOTE — Progress Notes (Signed)
 PROGRESS NOTE                                                                                                                                                                                                             Patient Demographics:    Dylan [name], is a 89 y.o. male, DOB - [birth year], BJY:[id number]  Outpatient Primary MD for the patient is Tisovec, Kristina Pfeiffer, MD    LOS - 4  Admit date - 12/26/2023    No chief complaint on file.      Brief Narrative (HPI from H&P)    89 y.o. male with medical history significant of A-fib on chronic anticoagulation with Eliquis , orthostatic hypotension on midodrine , diabetes mellitus, hypertension, CHF, tachybradycardia syndrome with junctional bradycardia, status post Medtronic dual-chamber PPM, chronic diastolic CHF, CKD stage IIIa with baseline creatinine approximately 1, hyperlipidemia presented to the ED with confusion/altered mental status.  Patient noted to have been recently hospitalized 11/18/2023-11/21/2023 for frequent falls, UTI, dehydration.  Apparently he has been getting off-and-on confused at his assisted living facility for the first few days, and asked state of confusion he punched a wall and injured his left hand, he was found to be confused, he had evidence of left hand injury and fractures of the thumb and fourth digit, chest x-ray suggestive of aortic valve infection versus CHF, elevated total bilirubin, clinically appeared dehydrated with elevated lactic acid, there was question of SIRs from unclear source and he was admitted.   Subjective:   Patient in bed, appears comfortable, denies any headache, no fever, no chest pain or pressure, no shortness of breath , no abdominal pain. No focal weakness.   Assessment  & Plan :   Note family now interested in pursuing palliative measures and comfort, and by palliative care now transition to full comfort care, looking for SNF with  palliative care versus residential hospice, family to have meetings with social work and palliative care today and make the decision.     Other medical issues addressed earlier this admission are below.   Acute on chronic metabolic encephalopathy.  Likely developing dementia, he is having off-and-on bouts of confusion for the last few weeks, recently had change of place which likely exacerbated his problems, there was also evidence of dehydration and SIRS at the time of admission.  Currently he will be treated  with supportive care, avoid benzodiazepines and narcotics, nighttime Seroquel and monitor he likely is developing age-related cognitive decline.  He remains at risk for gradually worsening cognitive decline, aspiration risk.  Family told and understand.  Family explained that he is at risk for worsening mental status on a gradual and sustained basis.  Head CT is nonacute, he has no headache or new focal deficits.  Stable TSH, stable B12, unremarkable RPR and HIV testing.  With supportive care improved, he likely is developing age-related cognitive decline with gradual but progressive chronic worsening.  SIRs - at the time of admission, chest x-ray suggestive of CHF versus atypical pneumonia, CT scan shows distended gallbladder with a possible gallstone, elevated total bilirubin, abdominal exam relatively benign.  For now we will place him on Unasyn and doxycycline covering for pneumonia and any underlying mild cholecystitis, follow cultures, respiratory viral panel negative, MRSA nasal PCR positive, HIDA scan -ve.  Clinically much improved and stable.  Distended gallbladder with gallstone, high total bilirubin.  HIDA scan pending, discussed with GI they have nothing to offer.  Mechanical injury with fracture of the left thumb and fourth digit.  Ortho input appreciated.  Dehydration with lactic acidosis.  Resolved with IV fluids, now has evidence of fluid overload, TED stockings and gentle  diuresis.  Hypokalemia and hypophosphatemia.  Replaced.  Ongoing right shoulder, left hip and neck pain.  X-rays of the right shoulder and left hip nonacute, CT scan of the C-spine noted, has been seen by orthopedics, supportive care for his left hand injuries, discussed his next CT findings with ENT Dr. Virgia Griffins, no intervention needed, overall stable except for mild neck pain, likely due to chronic arthritis, C-spine itself was nonacute, trial of low-dose steroids x 5 days and monitor.  Paroxysmal atrial fibrillation.  [place] vas 2 score of greater than 3.  Still cholecystitis ruled out switch Eliquis  to Lovenox , low-dose beta-blocker as tolerated, monitor on telemetry.  History of dysphagia.  On dysphagia 3 diet at ALF, speech input, at risk for ongoing aspiration.  Acute on chronic  diastolic CHF EF 60%.  Has evidence of fluid overload with some acute on chronic decompensation, gentle diuresis as tolerated by blood pressure.  Foley catheter placement in the ER.  Reason unclear, no documented urinary retention, Flomax Foley catheter discontinued 12/28/2023.  Monitor.  DM type II.  Diet controlled, A1c 5.9 recently.       Condition -   Guarded  Family Communication  :    son Georgette Kins [phone]  on 12/27/2023, 12/28/2023, 12/30/2023 message left at 7:41 AM, 12/31/23    Daughter [name] [phone] bedside 12/29/2023  Code Status :  DNR  Consults  :  Ortho, [facility] GI Dr. Karene Oto, [facility] ENT Dr. Virgia Griffins  PUD Prophylaxis :  PPI   Procedures  :     HIDA scan.  Nonacute.  CT C-spine.  1. No acute cervical spine fracture or destructive process. 2. New mild retropharyngeal soft tissue swelling and/or fluid, indeterminate. This could be secondary to infection/inflammation of uncertain etiology or potentially related to generalized volume overload given partially visualized pleural effusions and body wall edema. No organized fluid collection. 3. Advanced cervical disc and facet degeneration with  mild-to-moderate spinal stenosis and moderate to severe neural foraminal stenosis as above  RUQ - Distended gallbladder with a stone. No further sonographic evidence of acute cholecystitis at this time. No ductal dilatation. Right-sided pleural effusion  CT Abd Pelvis - Cardiomegaly, coronary artery disease.  Aortic atherosclerosis. Small bilateral effusions  with compressive atelectasis in the lower lobes. Cholelithiasis.  No evidence for acute cholecystitis. No acute findings in the abdomen or pelvis.  CT head - Non acute      Disposition Plan  :    Status is: Inpt  DVT Prophylaxis  :    Place TED hose Start: 12/27/23 0814 Lovenox     Lab Results  Component Value Date   PLT 174 12/30/2023    Diet :  Diet Order             DIET DYS 3 Room service appropriate? Yes; Fluid consistency: Nectar Thick  Diet effective now                    Inpatient Medications  Scheduled Meds:  Chlorhexidine  Gluconate Cloth  6 each Topical Daily   fluticasone  2 spray Each Nare Daily   Gerhardt's butt cream   Topical BID   ipratropium  1 spray Each Nare BID   leptospermum manuka honey  1 Application Topical Daily   lidocaine   1 patch Transdermal Q24H   loratadine  10 mg Oral Daily   metoprolol  tartrate  25 mg Oral BID   mupirocin ointment  1 Application Nasal BID   pantoprazole  40 mg Oral Daily   QUEtiapine  25 mg Oral QHS   senna  1 tablet Oral BID   sodium chloride  flush  3 mL Intravenous Q12H   tamsulosin  0.4 mg Oral Daily   Continuous Infusions:   PRN Meds:.acetaminophen , hydrALAZINE , ketorolac , levalbuterol , metoprolol  tartrate, [DISCONTINUED] ondansetron  **OR** ondansetron  (ZOFRAN ) IV, polyethylene glycol, sorbitol     Objective:   Vitals:   12/30/23 0339 12/30/23 0848 12/30/23 1648 12/30/23 2222  BP: (!) 153/101 (!) 137/110 (!) 141/93 136/88  Pulse: (!) 105 (!) 117 99 (!) 107  Resp: 20 19 (!) 21   Temp: (!) 97.5 F (36.4 C) 98.7 F (37.1 C) 98.2 F (36.8 C)  98.3 F (36.8 C)  TempSrc: Oral Oral Oral Axillary  SpO2: 96% 98% 94% 96%  Weight:        Wt Readings from Last 3 Encounters:  12/28/23 105.7 kg  11/18/23 104.3 kg  11/14/23 104.3 kg     Intake/Output Summary (Last 24 hours) at 12/31/2023 0759 Last data filed at 12/30/2023 1746 Gross per 24 hour  Intake 140 ml  Output 400 ml  Net -260 ml     Physical Exam  Awake, minimally confused no new F.N deficits, Foley catheter in place, right upper extremity slightly weak due to pain in the right shoulder and guarding, left hand has a hematoma at the site of previous injury from punching the wall [place].AT,PERRAL Supple Neck, No JVD,   Symmetrical Chest wall movement, Good air movement bilaterally, CTAB RRR,No Gallops,Rubs or new Murmurs,  +ve B.Sounds, Abd Soft, No tenderness,   No Cyanosis, Clubbing or edema     RN pressure injury documentation: Pressure Injury 06/10/22 Buttocks Left;Lower;Anterior Stage 2 -  Partial thickness loss of dermis presenting as a shallow open injury with a red, pink wound bed without slough. small size of a pea (Active)  06/10/22 1849  Location: Buttocks  Location Orientation: Left;Lower;Anterior  Staging: Stage 2 -  Partial thickness loss of dermis presenting as a shallow open injury with a red, pink wound bed without slough.  Wound Description (Comments): small size of a pea  Present on Admission: Yes  Dressing Type Foam - Lift dressing to assess site every shift 12/30/23 1930  Data Review:    Recent Labs  Lab 12/26/23 1432 12/27/23 0547 12/28/23 0543 12/29/23 0541 12/30/23 0428  WBC 10.9* 7.8 6.2 5.3 6.3  HGB 11.2* 10.8* 10.3* 11.2* 10.7*  HCT 34.4* 33.6* 32.0* 34.6* 33.7*  PLT 141* 110* 128* 169 174  MCV 100.3* 99.7 99.7 98.0 98.8  MCH 32.7 32.0 32.1 31.7 31.4  MCHC 32.6 32.1 32.2 32.4 31.8  RDW 14.4 14.4 14.2 13.9 13.7  LYMPHSABS 1.2 1.2 1.0 0.9 1.0  MONOABS 1.4* 0.7 0.6 0.5 0.5  EOSABS 0.0 0.1 0.1 0.0 0.0  BASOSABS 0.0 0.0 0.1  0.0 0.0    Recent Labs  Lab 12/26/23 1432 12/26/23 1438 12/26/23 1711 12/26/23 2136 12/27/23 0547 12/27/23 1005 12/28/23 0543 12/29/23 0541 12/30/23 0428  NA 136  --   --   --  137  --  137 138 137  K 3.1*  --   --   --  3.4*  --  3.6 3.7 3.6  CL 100  --   --   --  102  --  101 104 102  CO2 25  --   --   --  25  --  26 26 26   ANIONGAP 11  --   --   --  10  --  10 8 9   GLUCOSE 139*  --   --   --  104*  --  103* 131* 149*  BUN 10  --   --   --  10  --  13 14 20   CREATININE 0.71  --   --   --  0.77  --  0.80 0.82 0.93  AST 19  --   --   --  15  --   --   --  22  ALT 10  --   --   --  9  --   --   --  14  ALKPHOS 75  --   --   --  67  --   --   --  69  BILITOT 5.1*  --   --   --  3.8*  --   --   --  1.4*  ALBUMIN 3.0*  --   --   --  2.6*  --   --   --  2.6*  CRP  --   --   --   --  20.0*  --  15.3* 8.9*  --   PROCALCITON  --   --   --   --  0.10  --  0.10 <0.10  --   LATICACIDVEN  --  2.4* 3.5* 1.3  --   --   --   --   --   INR 2.2*  --   --   --   --   --   --   --   --   TSH  --   --   --   --   --  2.076  --   --   --   BNP  --   --   --   --  396.8*  --  513.0* 732.8*  --   MG 1.8  --   --   --  2.0  --  1.9 2.0 2.0  PHOS  --   --   --   --  2.4*  --   --   --   --   CALCIUM  8.9  --   --   --  8.6*  --  8.2* 8.5* 8.8*      Recent Labs  Lab 12/26/23 1432 12/26/23 1438 12/26/23 1711 12/26/23 2136 12/27/23 0547 12/27/23 1005 12/28/23 0543 12/29/23 0541 12/30/23 0428  CRP  --   --   --   --  20.0*  --  15.3* 8.9*  --   PROCALCITON  --   --   --   --  0.10  --  0.10 <0.10  --   LATICACIDVEN  --  2.4* 3.5* 1.3  --   --   --   --   --   INR 2.2*  --   --   --   --   --   --   --   --   TSH  --   --   --   --   --  2.076  --   --   --   BNP  --   --   --   --  396.8*  --  513.0* 732.8*  --   MG 1.8  --   --   --  2.0  --  1.9 2.0 2.0  CALCIUM  8.9  --   --   --  8.6*  --  8.2* 8.5* 8.8*     --------------------------------------------------------------------------------------------------------------- Lab Results  Component Value Date   CHOL 111 09/16/2023   HDL 40 (L) 09/16/2023   LDLCALC 49 09/16/2023   TRIG 109 09/16/2023   CHOLHDL 2.8 09/16/2023    Lab Results  Component Value Date   HGBA1C 5.9 (H) 11/20/2023   No results for input(s): "TSH", "T4TOTAL", "FREET4", "T3FREE", "THYROIDAB" in the last 72 hours.  No results for input(s): "VITAMINB12", "FOLATE", "FERRITIN", "TIBC", "IRON", "RETICCTPCT" in the last 72 hours.     Micro Results Recent Results (from the past 240 hours)  Blood Culture (routine x 2)     Status: None (Preliminary result)   Collection Time: 12/26/23  2:30 PM   Specimen: BLOOD RIGHT ARM  Result Value Ref Range Status   Specimen Description BLOOD RIGHT ARM  Final   Special Requests   Final    BOTTLES DRAWN AEROBIC AND ANAEROBIC Blood Culture adequate volume   Culture   Final    NO GROWTH 4 DAYS Performed at [facility], [address] [place], [place] [address]    Report Status PENDING  Incomplete  Resp panel by RT-PCR (RSV, Flu A&B, Covid) Anterior Nasal Swab     Status: None   Collection Time: 12/26/23  2:32 PM   Specimen: Anterior Nasal Swab  Result Value Ref Range Status   SARS Coronavirus 2 by RT PCR NEGATIVE NEGATIVE Final   Influenza A by PCR NEGATIVE NEGATIVE Final   Influenza B by PCR NEGATIVE NEGATIVE Final    Comment: (NOTE) The Xpert Xpress SARS-CoV-2/FLU/RSV plus assay is intended as an aid in the diagnosis of influenza from Nasopharyngeal swab specimens and should not be used as a sole basis for treatment. Nasal washings and aspirates are unacceptable for Xpert Xpress SARS-CoV-2/FLU/RSV testing.  Fact Sheet for Patients: BloggerCourse.com  Fact Sheet for Healthcare Providers: SeriousBroker.it  This test is not yet approved or cleared by the [place]   FDA and has been authorized for detection and/or diagnosis of SARS-CoV-2 by FDA under an Emergency Use Authorization (EUA). This EUA will remain in effect (meaning this test can be used) for the duration of the COVID-19 declaration under Section 564(b)(1) of the Act, 21 U.S.C. section 360bbb-3(b)(1),  unless the authorization is terminated or revoked.     Resp Syncytial Virus by PCR NEGATIVE NEGATIVE Final    Comment: (NOTE) Fact Sheet for Patients: BloggerCourse.com  Fact Sheet for Healthcare Providers: SeriousBroker.it  This test is not yet approved or cleared by the [place]  FDA and has been authorized for detection and/or diagnosis of SARS-CoV-2 by FDA under an Emergency Use Authorization (EUA). This EUA will remain in effect (meaning this test can be used) for the duration of the COVID-19 declaration under Section 564(b)(1) of the Act, 21 U.S.C. section 360bbb-3(b)(1), unless the authorization is terminated or revoked.  Performed at [facility], [address] [place], [place] [address]   Blood Culture (routine x 2)     Status: None (Preliminary result)   Collection Time: 12/26/23  2:44 PM   Specimen: BLOOD RIGHT ARM  Result Value Ref Range Status   Specimen Description BLOOD RIGHT ARM  Final   Special Requests   Final    BOTTLES DRAWN AEROBIC AND ANAEROBIC Blood Culture adequate volume   Culture   Final    NO GROWTH 4 DAYS Performed at [facility], [address], [place], [place] [address]    Report Status PENDING  Incomplete  MRSA Next Gen by PCR, Nasal     Status: Abnormal   Collection Time: 12/27/23  4:30 AM   Specimen: Nasal Mucosa; Nasal Swab  Result Value Ref Range Status   MRSA by PCR Next Gen DETECTED (A) NOT DETECTED Final    Comment: RESULT CALLED TO, READ BACK BY AND VERIFIED WITH: P BURTON,RN@0643  12/27/23 MK (NOTE) The GeneXpert MRSA Assay (FDA approved for NASAL specimens  only), is one component of a comprehensive MRSA colonization surveillance program. It is not intended to diagnose MRSA infection nor to guide or monitor treatment for MRSA infections. Test performance is not FDA approved in patients less than 4 years old. Performed at [facility], [address] [place], [place] [address]   Respiratory (~20 pathogens) panel by PCR     Status: None   Collection Time: 12/27/23  8:42 AM   Specimen: Nasopharyngeal Swab; Respiratory  Result Value Ref Range Status   Adenovirus NOT DETECTED NOT DETECTED Final   Coronavirus 229E NOT DETECTED NOT DETECTED Final    Comment: (NOTE) The Coronavirus on the Respiratory Panel, DOES NOT test for the novel  Coronavirus (2019 nCoV)    Coronavirus HKU1 NOT DETECTED NOT DETECTED Final   Coronavirus NL63 NOT DETECTED NOT DETECTED Final   Coronavirus OC43 NOT DETECTED NOT DETECTED Final   Metapneumovirus NOT DETECTED NOT DETECTED Final   Rhinovirus / Enterovirus NOT DETECTED NOT DETECTED Final   Influenza A NOT DETECTED NOT DETECTED Final   Influenza B NOT DETECTED NOT DETECTED Final   Parainfluenza Virus 1 NOT DETECTED NOT DETECTED Final   Parainfluenza Virus 2 NOT DETECTED NOT DETECTED Final   Parainfluenza Virus 3 NOT DETECTED NOT DETECTED Final   Parainfluenza Virus 4 NOT DETECTED NOT DETECTED Final   Respiratory Syncytial Virus NOT DETECTED NOT DETECTED Final   Bordetella pertussis NOT DETECTED NOT DETECTED Final   Bordetella Parapertussis NOT DETECTED NOT DETECTED Final   Chlamydophila pneumoniae NOT DETECTED NOT DETECTED Final   Mycoplasma pneumoniae NOT DETECTED NOT DETECTED Final    Comment: Performed at [facility], [address], [place], [place] [address]    Radiology Report No results found.    Signature  -   Lynnwood Sauer  M.D on 12/31/2023 at 7:59 AM   -  To page go to [email or web address]

## 2024-01-01 DIAGNOSIS — R6510 Systemic inflammatory response syndrome (SIRS) of non-infectious origin without acute organ dysfunction: Secondary | ICD-10-CM | POA: Diagnosis not present

## 2024-01-01 DIAGNOSIS — R0609 Other forms of dyspnea: Secondary | ICD-10-CM | POA: Diagnosis not present

## 2024-01-01 DIAGNOSIS — R627 Adult failure to thrive: Secondary | ICD-10-CM | POA: Diagnosis not present

## 2024-01-01 DIAGNOSIS — R4182 Altered mental status, unspecified: Secondary | ICD-10-CM | POA: Diagnosis not present

## 2024-01-01 DIAGNOSIS — R52 Pain, unspecified: Secondary | ICD-10-CM | POA: Diagnosis not present

## 2024-01-01 DIAGNOSIS — G9341 Metabolic encephalopathy: Secondary | ICD-10-CM | POA: Diagnosis not present

## 2024-01-01 MED ORDER — GLYCOPYRROLATE 0.2 MG/ML IJ SOLN
0.1000 mg | Freq: Two times a day (BID) | INTRAMUSCULAR | Status: DC | PRN
  Administered 2024-01-02: 0.1 mg via INTRAVENOUS
  Filled 2024-01-01: qty 1

## 2024-01-01 MED ORDER — MORPHINE SULFATE (PF) 4 MG/ML IV SOLN
4.0000 mg | Freq: Four times a day (QID) | INTRAVENOUS | Status: DC
  Administered 2024-01-01 – 2024-01-02 (×2): 4 mg via INTRAVENOUS
  Filled 2024-01-01 (×2): qty 1

## 2024-01-01 MED ORDER — GLYCOPYRROLATE 0.2 MG/ML IJ SOLN
0.1000 mg | Freq: Once | INTRAMUSCULAR | Status: AC
  Administered 2024-01-01: 0.1 mg via INTRAVENOUS
  Filled 2024-01-01: qty 1

## 2024-01-01 NOTE — Progress Notes (Signed)
 [facility] [phone] [facility] hospital liaison note   Received request from [facility] for family interest in hospice inpatient unit. Reviewed chart and visited patient as well as obtaining information from bedside nurse.    At this time Dylan [name] would not be approved for inpatient hospice care.   He is appropriate for hospice services in the home or LTC facility and we will follow for signs of decline that would make him appropriate for inpatient hospice care.   Thank you for the opportunity to participate in this patient's care.    Madelene Schanz, BSN, RN, [facility] hospital liaison [phone]

## 2024-01-01 NOTE — Progress Notes (Signed)
 Patient ID: NASEEM STEPHAN, male   DOB: 1926-07-18, 89 y.o.   MRN: [id number]    Progress Note from the Palliative Medicine Team at [facility]   Patient Name: CAEDON KENISTON        Date: 01/01/2024 DOB: November 07, 1925  Age: 89 y.o. MRN#: [id number] Attending Physician: Epifanio Haste, MD Primary Care Physician: Tisovec, Richard W, MD Admit Date: 12/26/2023   Reason for Consultation/Follow-up   Establishing Goals of Care   HPI/ Brief Hospital Review  89 y.o. male  with past medical history of A-fib on chronic anticoagulation with Eliquis , orthostatic hypotension on midodrine , diabetes mellitus, hypertension, CHF, tachybradycardia syndrome with junctional bradycardia, status post Medtronic dual-chamber PPM, chronic diastolic CHF, CKD stage IIIa with baseline creatinine approximately 1, hyperlipidemia presented to the ED admitted on 12/26/2023 with confusion/altered mental status and agitation punching a wall and sustaining hand pain/injury.    Workup revealed SIRS, acute metabolic encephalopathy, L 4th phalanx fx and possible thumb fx, gallbladder stone. Head CT negative for acute injury; chronic microvascular disease. Concern for CHF vs atypical PNA. Of note, recent admit 11/18/23-11/21/23 with falls, UTI, dehydration; ED admit 4/17 after fall hitting back of head.   Focus of care is comfort and dignity allowing for natural death, foregoing life prolonging measures.   Subjective  Extensive chart review has been completed prior to meeting with patient/family  including labs, vital signs, imaging, progress/consult notes, orders, medications and available advance directive documents.   This NP assessed patient at the bedside as a follow up for palliative medicine needs and emotional support.  Daughter/Elaine at bedside.  Education offered on the natural trajectory and expectations at end-of-life.   I gave her  a Hard Choices booklet   Physical exam - [place] to name, more lethargic today -  Pale, cyanosis noted around lips  - Audible throat secretions, able to clear with encouragement - Decreased oral intake    Focus of care to comfort and dignity allowing for natural death.  Plan of care -DNR/DNI - No artificial feeding or hydration now or in the future, comfort feeds with known risk of aspiration - No further diagnostics; labs,  scans - No further IV antibiotics -Symptom management     -Added Roxanol 5 mg p.o./sublingual every 6 hours scheduled for underlying chronic pain - Referral placed for patient to be assessed for inpatient hospice, family is requiring about [facility].  Await input.  I spoke to patient's son by phone.  Ongoing conversation regarding current medical situation,  education offered on hospice benefit.   Questions and concerns addressed   Discussed with primary team and nursing staff via secure chat  PMT will continue to support holistically.   Time: 50    minutes  Detailed review of medical records ( labs, imaging, vital signs), medically appropriate exam ( MS, skin, cardiac,  resp)   discussed with treatment team, counseling and education to patient, family, staff, documenting clinical information, medication management, coordination of care    Thena Fireman NP  Palliative Medicine Team Team Phone # [phone] Pager [phone]

## 2024-01-01 NOTE — Progress Notes (Signed)
 PROGRESS NOTE                                                                                                                                                                                                             Patient Demographics:    Dylan [name], is a 89 y.o. male, DOB - 1926-05-14, ZOX:[id number]  Outpatient Primary MD for the patient is Tisovec, Kristina Pfeiffer, MD    LOS - 5  Admit date - 12/26/2023    No chief complaint on file.      Brief Narrative (HPI from H&P)      89 y.o. male with medical history significant of A-fib on chronic anticoagulation with Eliquis , orthostatic hypotension on midodrine , diabetes mellitus, hypertension, CHF, tachybradycardia syndrome with junctional bradycardia, status post [employer] dual-chamber PPM, chronic diastolic CHF, CKD stage IIIa with baseline creatinine approximately 1, hyperlipidemia presented to the ED with confusion/altered mental status.  Patient noted to have been recently hospitalized 11/18/2023-11/21/2023 for frequent falls, UTI, dehydration.  Apparently he has been getting off-and-on confused at his assisted living facility for the first few days, and asked state of confusion he punched a wall and injured his left hand, he was found to be confused, he had evidence of left hand injury and fractures of the thumb and fourth digit, chest x-ray suggestive of aortic valve infection versus CHF, elevated total bilirubin, clinically appeared dehydrated with elevated lactic acid, there was question of SIRs from unclear source and he was admitted.   Subjective:   Patient in bed, poor oral intake as discussed with staff overnight, he is hard of hearing, mildly confused, cannot give reliable history, but currently denies any complaints.     Assessment  & Plan :    Acute on chronic metabolic encephalopathy.   - Patient with likely baseline underlying dementia. - Appears to be having  worsening mental status in the setting of acute illness, but overall as discussed with daughter at bedside, poor baseline, gradual cognitive and functional decline. - Stable TSH, stable B12, unremarkable RPR and HIV testing.  Pneumonia -Significant for multifocal opacity bilaterally -Treated with antibiotics doxycycline and Unasyn - Patient is now comfort care  Cholelithiasis  Hyperbilirubinemia - No evidence of infection on HIDA scan   Mechanical injury with fracture of the left thumb and fourth digit.  Ortho input appreciated.  Dehydration  with lactic acidosis.  Resolved with IV fluids, now has evidence of fluid overload, TED stockings and gentle diuresis.  Hypokalemia and hypophosphatemia.  Replaced.  Ongoing right shoulder, left hip and neck pain.  X-rays of the right shoulder and left hip nonacute, CT scan of the C-spine noted, has been seen by orthopedics, supportive care for his left hand injuries, discussed his next CT findings with ENT Dr. Virgia Griffins, no intervention needed, overall stable except for mild neck pain, likely due to chronic arthritis, C-spine itself was nonacute, on steroids  Paroxysmal atrial fibrillation.  [place] vas 2 score of greater than 3.   - Comfort care, off anticoagulation  History of dysphagia.  On dysphagia 3 diet at ALF, speech input, at risk for ongoing aspiration.  Acute on chronic  diastolic CHF EF 60%.  Has evidence of fluid overload with some acute on chronic decompensation, gentle diuresis as tolerated by blood pressure.  Foley catheter placement in the ER.  Reason unclear, no documented urinary retention, Flomax Foley catheter discontinued 12/28/2023.  Monitor.  DM type II.  Diet controlled, A1c 5.9 recently.  Goals of care discussion - Patient with continuous decline, mentally and physically, palliative medicine input platelet appreciated, upon discussion with family plan to proceed with full comfort measures       Condition -    Guarded  Family Communication  :    Discussed with daughter [name] at bedside  Code Status :  DNR  Consults  :  Ortho, [facility] Dr. Karene Oto, [facility] ENT Dr. Virgia Griffins, palliative medicine  PUD Prophylaxis :  PPI   Procedures  :     HIDA scan.  Nonacute.  CT C-spine.  1. No acute cervical spine fracture or destructive process. 2. New mild retropharyngeal soft tissue swelling and/or fluid, indeterminate. This could be secondary to infection/inflammation of uncertain etiology or potentially related to generalized volume overload given partially visualized pleural effusions and body wall edema. No organized fluid collection. 3. Advanced cervical disc and facet degeneration with mild-to-moderate spinal stenosis and moderate to severe neural foraminal stenosis as above  RUQ - Distended gallbladder with a stone. No further sonographic evidence of acute cholecystitis at this time. No ductal dilatation. Right-sided pleural effusion  CT Abd Pelvis - Cardiomegaly, coronary artery disease.  Aortic atherosclerosis. Small bilateral effusions with compressive atelectasis in the lower lobes. Cholelithiasis.  No evidence for acute cholecystitis. No acute findings in the abdomen or pelvis.  CT head - Non acute      Disposition Plan  :    Status is: Inpt  DVT Prophylaxis  :    Place TED hose Start: 12/27/23 0814 Lovenox     Lab Results  Component Value Date   PLT 174 12/30/2023    Diet :  Diet Order             Diet regular Room service appropriate? Yes; Fluid consistency: Thin  Diet effective now                    Inpatient Medications  Scheduled Meds:  fluticasone  2 spray Each Nare Daily   Gerhardt's butt cream   Topical BID   ipratropium  1 spray Each Nare BID   leptospermum manuka honey  1 Application Topical Daily   lidocaine   1 patch Transdermal Q24H   loratadine  10 mg Oral Daily   morphine  CONCENTRATE  5 mg Oral Q6H   pantoprazole  40 mg Oral Daily   QUEtiapine  25 mg  Oral QHS   senna  1 tablet Oral BID   sodium chloride  flush  3 mL Intravenous Q12H   tamsulosin  0.4 mg Oral Daily   Continuous Infusions:   PRN Meds:.acetaminophen , HYDROcodone -acetaminophen , levalbuterol , [DISCONTINUED] ondansetron  **OR** ondansetron  (ZOFRAN ) IV, polyethylene glycol, sorbitol     Objective:   Vitals:   12/31/23 1940 01/01/24 0759 01/01/24 0800 01/01/24 1244  BP: (!) 145/94  122/84 114/72  Pulse: (!) 157  77 71  Resp:      Temp:  97.6 F (36.4 C) 97.6 F (36.4 C) (!) 97.3 F (36.3 C)  TempSrc:  Oral Oral Oral  SpO2: 90%  92%   Weight:        Wt Readings from Last 3 Encounters:  12/28/23 105.7 kg  11/18/23 104.3 kg  11/14/23 104.3 kg     Intake/Output Summary (Last 24 hours) at 01/01/2024 1431 Last data filed at 01/01/2024 0650 Gross per 24 hour  Intake 190 ml  Output 900 ml  Net -710 ml     Physical Exam  Awake, altered, confused, frail, deconditioned and ill-appearing  Fair air entry bilaterally with scattered Rales at the bases  Regular rate and rhythm  Abdomen soft  Extremities with no edema ss,   No Cyanosis, Clubbing    RN pressure injury documentation: Pressure Injury 06/10/22 Buttocks Left;Lower;Anterior Stage 2 -  Partial thickness loss of dermis presenting as a shallow open injury with a red, pink wound bed without slough. small size of a pea (Active)  06/10/22 1849  Location: Buttocks  Location Orientation: Left;Lower;Anterior  Staging: Stage 2 -  Partial thickness loss of dermis presenting as a shallow open injury with a red, pink wound bed without slough.  Wound Description (Comments): small size of a pea  Present on Admission: Yes  Dressing Type Foam - Lift dressing to assess site every shift;Honey 01/01/24 0930      Data Review:    Recent Labs  Lab 12/26/23 1432 12/27/23 0547 12/28/23 0543 12/29/23 0541 12/30/23 0428  WBC 10.9* 7.8 6.2 5.3 6.3  HGB 11.2* 10.8* 10.3* 11.2* 10.7*  HCT 34.4* 33.6* 32.0* 34.6* 33.7*   PLT 141* 110* 128* 169 174  MCV 100.3* 99.7 99.7 98.0 98.8  MCH 32.7 32.0 32.1 31.7 31.4  MCHC 32.6 32.1 32.2 32.4 31.8  RDW 14.4 14.4 14.2 13.9 13.7  LYMPHSABS 1.2 1.2 1.0 0.9 1.0  MONOABS 1.4* 0.7 0.6 0.5 0.5  EOSABS 0.0 0.1 0.1 0.0 0.0  BASOSABS 0.0 0.0 0.1 0.0 0.0    Recent Labs  Lab 12/26/23 1432 12/26/23 1438 12/26/23 1711 12/26/23 2136 12/27/23 0547 12/27/23 1005 12/28/23 0543 12/29/23 0541 12/30/23 0428  NA 136  --   --   --  137  --  137 138 137  K 3.1*  --   --   --  3.4*  --  3.6 3.7 3.6  CL 100  --   --   --  102  --  101 104 102  CO2 25  --   --   --  25  --  26 26 26   ANIONGAP 11  --   --   --  10  --  10 8 9   GLUCOSE 139*  --   --   --  104*  --  103* 131* 149*  BUN 10  --   --   --  10  --  13 14 20   CREATININE 0.71  --   --   --  0.77  --  0.80 0.82 0.93  AST 19  --   --   --  15  --   --   --  22  ALT 10  --   --   --  9  --   --   --  14  ALKPHOS 75  --   --   --  67  --   --   --  69  BILITOT 5.1*  --   --   --  3.8*  --   --   --  1.4*  ALBUMIN 3.0*  --   --   --  2.6*  --   --   --  2.6*  CRP  --   --   --   --  20.0*  --  15.3* 8.9*  --   PROCALCITON  --   --   --   --  0.10  --  0.10 <0.10  --   LATICACIDVEN  --  2.4* 3.5* 1.3  --   --   --   --   --   INR 2.2*  --   --   --   --   --   --   --   --   TSH  --   --   --   --   --  2.076  --   --   --   BNP  --   --   --   --  396.8*  --  513.0* 732.8*  --   MG 1.8  --   --   --  2.0  --  1.9 2.0 2.0  PHOS  --   --   --   --  2.4*  --   --   --   --   CALCIUM  8.9  --   --   --  8.6*  --  8.2* 8.5* 8.8*      Recent Labs  Lab 12/26/23 1432 12/26/23 1438 12/26/23 1711 12/26/23 2136 12/27/23 0547 12/27/23 1005 12/28/23 0543 12/29/23 0541 12/30/23 0428  CRP  --   --   --   --  20.0*  --  15.3* 8.9*  --   PROCALCITON  --   --   --   --  0.10  --  0.10 <0.10  --   LATICACIDVEN  --  2.4* 3.5* 1.3  --   --   --   --   --   INR 2.2*  --   --   --   --   --   --   --   --   TSH  --   --   --    --   --  2.076  --   --   --   BNP  --   --   --   --  396.8*  --  513.0* 732.8*  --   MG 1.8  --   --   --  2.0  --  1.9 2.0 2.0  CALCIUM  8.9  --   --   --  8.6*  --  8.2* 8.5* 8.8*    --------------------------------------------------------------------------------------------------------------- Lab Results  Component Value Date   CHOL 111 09/16/2023   HDL 40 (L) 09/16/2023   LDLCALC 49 09/16/2023   TRIG 109 09/16/2023   CHOLHDL 2.8 09/16/2023    Lab Results  Component Value Date   HGBA1C 5.9 (H) 11/20/2023   No results  for input(s): "TSH", "T4TOTAL", "FREET4", "T3FREE", "THYROIDAB" in the last 72 hours.  No results for input(s): "VITAMINB12", "FOLATE", "FERRITIN", "TIBC", "IRON", "RETICCTPCT" in the last 72 hours.     Micro Results Recent Results (from the past 240 hours)  Blood Culture (routine x 2)     Status: None   Collection Time: 12/26/23  2:30 PM   Specimen: BLOOD RIGHT ARM  Result Value Ref Range Status   Specimen Description BLOOD RIGHT ARM  Final   Special Requests   Final    BOTTLES DRAWN AEROBIC AND ANAEROBIC Blood Culture adequate volume   Culture   Final    NO GROWTH 5 DAYS Performed at [facility], [address], [place], [place] [address]    Report Status 12/31/2023 FINAL  Final  Resp panel by RT-PCR (RSV, Flu A&B, Covid) Anterior Nasal Swab     Status: None   Collection Time: 12/26/23  2:32 PM   Specimen: Anterior Nasal Swab  Result Value Ref Range Status   SARS Coronavirus 2 by RT PCR NEGATIVE NEGATIVE Final   Influenza A by PCR NEGATIVE NEGATIVE Final   Influenza B by PCR NEGATIVE NEGATIVE Final    Comment: (NOTE) The Xpert Xpress SARS-CoV-2/FLU/RSV plus assay is intended as an aid in the diagnosis of influenza from Nasopharyngeal swab specimens and should not be used as a sole basis for treatment. Nasal washings and aspirates are unacceptable for Xpert Xpress SARS-CoV-2/FLU/RSV testing.  Fact Sheet for  Patients: BloggerCourse.com  Fact Sheet for Healthcare Providers: SeriousBroker.it  This test is not yet approved or cleared by the [place]  FDA and has been authorized for detection and/or diagnosis of SARS-CoV-2 by FDA under an Emergency Use Authorization (EUA). This EUA will remain in effect (meaning this test can be used) for the duration of the COVID-19 declaration under Section 564(b)(1) of the Act, 21 U.S.C. section 360bbb-3(b)(1), unless the authorization is terminated or revoked.     Resp Syncytial Virus by PCR NEGATIVE NEGATIVE Final    Comment: (NOTE) Fact Sheet for Patients: BloggerCourse.com  Fact Sheet for Healthcare Providers: SeriousBroker.it  This test is not yet approved or cleared by the [place]  FDA and has been authorized for detection and/or diagnosis of SARS-CoV-2 by FDA under an Emergency Use Authorization (EUA). This EUA will remain in effect (meaning this test can be used) for the duration of the COVID-19 declaration under Section 564(b)(1) of the Act, 21 U.S.C. section 360bbb-3(b)(1), unless the authorization is terminated or revoked.  Performed at [facility], [address], [place], [place] [address]   Blood Culture (routine x 2)     Status: None   Collection Time: 12/26/23  2:44 PM   Specimen: BLOOD RIGHT ARM  Result Value Ref Range Status   Specimen Description BLOOD RIGHT ARM  Final   Special Requests   Final    BOTTLES DRAWN AEROBIC AND ANAEROBIC Blood Culture adequate volume   Culture   Final    NO GROWTH 5 DAYS Performed at [facility], [address], [place], [place] [address]    Report Status 12/31/2023 FINAL  Final  MRSA Next Gen by PCR, Nasal     Status: Abnormal   Collection Time: 12/27/23  4:30 AM   Specimen: Nasal Mucosa; Nasal Swab  Result Value Ref Range Status   MRSA by PCR Next Gen DETECTED (A) NOT  DETECTED Final    Comment: RESULT CALLED TO, READ BACK BY AND VERIFIED WITH: P  BURTON,RN@[phone]  12/27/23 MK (NOTE) The GeneXpert MRSA Assay (FDA approved for NASAL specimens only), is one component of a comprehensive MRSA colonization surveillance program. It is not intended to diagnose MRSA infection nor to guide or monitor treatment for MRSA infections. Test performance is not FDA approved in patients less than 74 years old. Performed at [facility], [address], [place], [place] [address]   Respiratory (~20 pathogens) panel by PCR     Status: None   Collection Time: 12/27/23  8:42 AM   Specimen: Nasopharyngeal Swab; Respiratory  Result Value Ref Range Status   Adenovirus NOT DETECTED NOT DETECTED Final   Coronavirus 229E NOT DETECTED NOT DETECTED Final    Comment: (NOTE) The Coronavirus on the Respiratory Panel, DOES NOT test for the novel  Coronavirus (2019 nCoV)    Coronavirus HKU1 NOT DETECTED NOT DETECTED Final   Coronavirus NL63 NOT DETECTED NOT DETECTED Final   Coronavirus OC43 NOT DETECTED NOT DETECTED Final   Metapneumovirus NOT DETECTED NOT DETECTED Final   Rhinovirus / Enterovirus NOT DETECTED NOT DETECTED Final   Influenza A NOT DETECTED NOT DETECTED Final   Influenza B NOT DETECTED NOT DETECTED Final   Parainfluenza Virus 1 NOT DETECTED NOT DETECTED Final   Parainfluenza Virus 2 NOT DETECTED NOT DETECTED Final   Parainfluenza Virus 3 NOT DETECTED NOT DETECTED Final   Parainfluenza Virus 4 NOT DETECTED NOT DETECTED Final   Respiratory Syncytial Virus NOT DETECTED NOT DETECTED Final   Bordetella pertussis NOT DETECTED NOT DETECTED Final   Bordetella Parapertussis NOT DETECTED NOT DETECTED Final   Chlamydophila pneumoniae NOT DETECTED NOT DETECTED Final   Mycoplasma pneumoniae NOT DETECTED NOT DETECTED Final    Comment: Performed at [facility], [address] [place], [place] [address]    Radiology Report No results found.    Signature  -    Seena Dadds M.D on 01/01/2024 at 2:31 PM   -  To page go to [email or web address]

## 2024-01-01 NOTE — TOC Progression Note (Signed)
 Transition of Care [facility]) - Progression Note    Patient Details  Name: Dylan [name] MRN: [id number] Date of Birth: [birth year]  Transition of Care [facility] CM/SW Contact  Jannice Mends, LCSW Phone Number: 01/01/2024, 11:55 AM  Clinical Narrative:    CSW received consult for Hospice Facility placement with family preference for [facility]. CSW sent referral to [facility] for review.    Expected Discharge Plan: Skilled Nursing Facility Barriers to Discharge: Continued Medical Work up  Expected Discharge Plan and Services In-house Referral: Clinical Social Work   Post Acute Care Choice: Skilled Nursing Facility Living arrangements for the past 2 months: Skilled Nursing Facility                                       Social Determinants of Health (SDOH) Interventions SDOH Screenings   Food Insecurity: Patient Unable To Answer (12/26/2023)  Housing: Patient Unable To Answer (12/26/2023)  Transportation Needs: Patient Unable To Answer (12/26/2023)  Utilities: Patient Unable To Answer (12/26/2023)  Social Connections: Patient Declined (11/18/2023)  Tobacco Use: Medium Risk (12/26/2023)    Readmission Risk Interventions     No data to display

## 2024-01-02 DIAGNOSIS — Z515 Encounter for palliative care: Secondary | ICD-10-CM | POA: Diagnosis not present

## 2024-01-02 DIAGNOSIS — Z7189 Other specified counseling: Secondary | ICD-10-CM | POA: Diagnosis not present

## 2024-01-02 DIAGNOSIS — R4182 Altered mental status, unspecified: Secondary | ICD-10-CM | POA: Diagnosis not present

## 2024-01-02 DIAGNOSIS — R0989 Other specified symptoms and signs involving the circulatory and respiratory systems: Secondary | ICD-10-CM | POA: Diagnosis not present

## 2024-01-02 DIAGNOSIS — R6510 Systemic inflammatory response syndrome (SIRS) of non-infectious origin without acute organ dysfunction: Secondary | ICD-10-CM | POA: Diagnosis not present

## 2024-01-02 DIAGNOSIS — G9341 Metabolic encephalopathy: Secondary | ICD-10-CM | POA: Diagnosis not present

## 2024-01-02 MED ORDER — GLYCOPYRROLATE 0.2 MG/ML IJ SOLN
0.2000 mg | Freq: Four times a day (QID) | INTRAMUSCULAR | Status: DC
  Administered 2024-01-02 – 2024-01-03 (×5): 0.2 mg via INTRAVENOUS
  Filled 2024-01-02 (×4): qty 1

## 2024-01-02 MED ORDER — LORAZEPAM 2 MG/ML IJ SOLN
1.0000 mg | INTRAMUSCULAR | Status: DC | PRN
  Administered 2024-01-02 (×2): 1 mg via INTRAVENOUS
  Filled 2024-01-02 (×2): qty 1

## 2024-01-02 MED ORDER — SCOPOLAMINE 1 MG/3DAYS TD PT72
1.0000 | MEDICATED_PATCH | TRANSDERMAL | Status: DC
  Administered 2024-01-02: 1.5 mg via TRANSDERMAL
  Filled 2024-01-02: qty 1

## 2024-01-02 MED ORDER — MORPHINE SULFATE (PF) 4 MG/ML IV SOLN
4.0000 mg | INTRAVENOUS | Status: DC
  Administered 2024-01-02 – 2024-01-04 (×12): 4 mg via INTRAVENOUS
  Filled 2024-01-02 (×12): qty 1

## 2024-01-02 MED ORDER — GLYCOPYRROLATE 0.2 MG/ML IJ SOLN
0.1000 mg | Freq: Four times a day (QID) | INTRAMUSCULAR | Status: DC
  Administered 2024-01-02: 0.1 mg via INTRAVENOUS
  Filled 2024-01-02 (×2): qty 1

## 2024-01-02 MED ORDER — HALOPERIDOL LACTATE 5 MG/ML IJ SOLN: 2.0000 mg | Freq: Four times a day (QID) | INTRAMUSCULAR | Status: DC | PRN

## 2024-01-02 NOTE — Progress Notes (Signed)
 [facility] night cross cover note:   I was notified by the patient's RN that this patient, who is on comfort care measures, is having increasing secretions, and is exhibiting worsening cough when attempting to take any p.o., including p.o. medications.  He currently has orders for scheduled morphine  5 mg p.o. every 6 hours.  In light of the above, I subsequently changed his scheduled p.o. morphine  to morphine  4 mg IV every 6 hours scheduled, and added robinul  for his secretions.     Camelia Cavalier, DO Hospitalist

## 2024-01-02 NOTE — Plan of Care (Signed)
  Problem: Education: Goal: Knowledge of General Education information will improve Description: Including pain rating scale, medication(s)/side effects and non-pharmacologic comfort measures Outcome: Progressing   Problem: Clinical Measurements: Goal: Cardiovascular complication will be avoided Outcome: Progressing   Problem: Coping: Goal: Level of anxiety will decrease Outcome: Progressing   Problem: Elimination: Goal: Will not experience complications related to bowel motility Outcome: Progressing Goal: Will not experience complications related to urinary retention Outcome: Progressing   Problem: Pain Managment: Goal: General experience of comfort will improve and/or be controlled Outcome: Progressing   Problem: Safety: Goal: Ability to remain free from injury will improve Outcome: Progressing   Problem: Skin Integrity: Goal: Risk for impaired skin integrity will decrease Outcome: Progressing

## 2024-01-02 NOTE — Progress Notes (Signed)
 PROGRESS NOTE                                                                                                                                                                                                             Patient Demographics:    Dylan [name], is a 89 y.o. male, DOB - [birth year], AVW:[id number]  Outpatient Primary MD for the patient is Tisovec, Kristina Pfeiffer, MD    LOS - 6  Admit date - 12/26/2023    No chief complaint on file.      Brief Narrative (HPI from H&P)      89 y.o. male with medical history significant of A-fib on chronic anticoagulation with Eliquis , orthostatic hypotension on midodrine , diabetes mellitus, hypertension, CHF, tachybradycardia syndrome with junctional bradycardia, status post Medtronic dual-chamber PPM, chronic diastolic CHF, CKD stage IIIa with baseline creatinine approximately 1, hyperlipidemia presented to the ED with confusion/altered mental status.  Patient noted to have been recently hospitalized 11/18/2023-11/21/2023 for frequent falls, UTI, dehydration.  Apparently he has been getting off-and-on confused at his assisted living facility for the first few days, and asked state of confusion he punched a wall and injured his left hand, he was found to be confused, he had evidence of left hand injury and fractures of the thumb and fourth digit, chest x-ray suggestive of aortic valve infection versus CHF, elevated total bilirubin, clinically appeared dehydrated with elevated lactic acid, there was question of SIRs from unclear source and he was admitted.   Subjective:   Patient in bed, patient had some increased secretions overnight, couple of coughing with sips, some gurgling where he had to be started on Robinul  and his regimen was changed to IV.   Assessment  & Plan :    Acute on chronic metabolic encephalopathy.   - Patient with likely baseline underlying dementia. - Appears to be having  worsening mental status in the setting of acute illness, but overall as discussed with daughter at bedside, poor baseline, gradual cognitive and functional decline. - Stable TSH, stable B12, unremarkable RPR and HIV testing.  Pneumonia -Significant for multifocal opacity bilaterally -Treated with antibiotics doxycycline and Unasyn - Patient is now comfort care  Cholelithiasis  Hyperbilirubinemia - No evidence of infection on HIDA scan   Mechanical injury with fracture of the left thumb and fourth digit.  Ortho input appreciated.  Dehydration with lactic acidosis.  Resolved with IV fluids, now has evidence of fluid overload, TED stockings and gentle diuresis.  Hypokalemia and hypophosphatemia.  Replaced.  Ongoing right shoulder, left hip and neck pain.  X-rays of the right shoulder and left hip nonacute, CT scan of the C-spine noted, has been seen by orthopedics, supportive care for his left hand injuries, discussed his next CT findings with ENT Dr. Virgia Griffins, no intervention needed, overall stable except for mild neck pain, likely due to chronic arthritis, C-spine itself was nonacute, on steroids  Paroxysmal atrial fibrillation.  [place] vas 2 score of greater than 3.   - Comfort care, off anticoagulation  History of dysphagia.  On dysphagia 3 diet at ALF, speech input, at risk for ongoing aspiration.  Acute on chronic  diastolic CHF EF 60%.  Has evidence of fluid overload with some acute on chronic decompensation, gentle diuresis as tolerated by blood pressure.  Foley catheter placement in the ER.  Reason unclear, no documented urinary retention, Flomax Foley catheter discontinued 12/28/2023.  Monitor.  DM type II.  Diet controlled, A1c 5.9 recently.  Goals of care discussion - Patient with continuous decline, mentally and physically, palliative medicine input platelet appreciated, upon discussion with family plan to proceed with full comfort measures, currently on comfort measures, his  p.o. morphine  regimen has been changed to IV 4 mg every 6 hours overnight due to swallowing issues, he appears to be uncomfortable with morning this morning so I have increased it to every 4 hours, some agitation and restlessness so I have added some as needed Ativan and haloperidol, with significant oral secretion and gurgling as well so he was started on scheduled IV Robinul  and scopolamine patch.  Evaluated by [facility] for placement.       Condition -   Guarded  Family Communication  :    Discussed with daughter [name] at bedside  Code Status :  DNR  Consults  :  Ortho, [facility] Dr. Karene Oto, [facility] ENT Dr. Virgia Griffins, palliative medicine  PUD Prophylaxis :  PPI   Procedures  :     HIDA scan.  Nonacute.  CT C-spine.  1. No acute cervical spine fracture or destructive process. 2. New mild retropharyngeal soft tissue swelling and/or fluid, indeterminate. This could be secondary to infection/inflammation of uncertain etiology or potentially related to generalized volume overload given partially visualized pleural effusions and body wall edema. No organized fluid collection. 3. Advanced cervical disc and facet degeneration with mild-to-moderate spinal stenosis and moderate to severe neural foraminal stenosis as above  RUQ - Distended gallbladder with a stone. No further sonographic evidence of acute cholecystitis at this time. No ductal dilatation. Right-sided pleural effusion  CT Abd Pelvis - Cardiomegaly, coronary artery disease.  Aortic atherosclerosis. Small bilateral effusions with compressive atelectasis in the lower lobes. Cholelithiasis.  No evidence for acute cholecystitis. No acute findings in the abdomen or pelvis.  CT head - Non acute      Disposition Plan  :    Status is: Inpt  DVT Prophylaxis  :    Place TED hose Start: 12/27/23 0814 Lovenox     Lab Results  Component Value Date   PLT 174 12/30/2023    Diet :  Diet Order             Diet regular  Room service appropriate? Yes; Fluid consistency: Thin  Diet effective now  Inpatient Medications  Scheduled Meds:  fluticasone  2 spray Each Nare Daily   Gerhardt's butt cream   Topical BID   glycopyrrolate   0.1 mg Intravenous QID   ipratropium  1 spray Each Nare BID   leptospermum manuka honey  1 Application Topical Daily   lidocaine   1 patch Transdermal Q24H   loratadine  10 mg Oral Daily    morphine  injection  4 mg Intravenous Q4H   pantoprazole  40 mg Oral Daily   QUEtiapine  25 mg Oral QHS   scopolamine  1 patch Transdermal Q72H   senna  1 tablet Oral BID   sodium chloride  flush  3 mL Intravenous Q12H   tamsulosin  0.4 mg Oral Daily   Continuous Infusions:   PRN Meds:.acetaminophen , haloperidol lactate, HYDROcodone -acetaminophen , levalbuterol , LORazepam, [DISCONTINUED] ondansetron  **OR** ondansetron  (ZOFRAN ) IV, polyethylene glycol, sorbitol     Objective:   Vitals:   01/02/24 0200 01/02/24 0300 01/02/24 0400 01/02/24 0849  BP:    121/86  Pulse: 98 88 (!) 107 83  Resp:      Temp:    98.2 F (36.8 C)  TempSrc:    Axillary  SpO2: 94% 93% (!) 84% 92%  Weight:      Height:        Wt Readings from Last 3 Encounters:  01/01/24 111.7 kg  11/18/23 104.3 kg  11/14/23 104.3 kg     Intake/Output Summary (Last 24 hours) at 01/02/2024 1110 Last data filed at 01/02/2024 0654 Gross per 24 hour  Intake 120 ml  Output 1000 ml  Net -880 ml     Physical Exam  Somnolent, nonresponsive, does not follow any commands or answer any questions, appears comfortable, but intermittently moaning with some upper airway gurgling as well, Foley catheter was placed.   RN pressure injury documentation: Pressure Injury 06/10/22 Buttocks Left;Lower;Anterior Stage 2 -  Partial thickness loss of dermis presenting as a shallow open injury with a red, pink wound bed without slough. small size of a pea (Active)  06/10/22 1849  Location: Buttocks  Location  Orientation: Left;Lower;Anterior  Staging: Stage 2 -  Partial thickness loss of dermis presenting as a shallow open injury with a red, pink wound bed without slough.  Wound Description (Comments): small size of a pea  Present on Admission: Yes  Dressing Type Foam - Lift dressing to assess site every shift 01/01/24 2130      Data Review:    Recent Labs  Lab 12/26/23 1432 12/27/23 0547 12/28/23 0543 12/29/23 0541 12/30/23 0428  WBC 10.9* 7.8 6.2 5.3 6.3  HGB 11.2* 10.8* 10.3* 11.2* 10.7*  HCT 34.4* 33.6* 32.0* 34.6* 33.7*  PLT 141* 110* 128* 169 174  MCV 100.3* 99.7 99.7 98.0 98.8  MCH 32.7 32.0 32.1 31.7 31.4  MCHC 32.6 32.1 32.2 32.4 31.8  RDW 14.4 14.4 14.2 13.9 13.7  LYMPHSABS 1.2 1.2 1.0 0.9 1.0  MONOABS 1.4* 0.7 0.6 0.5 0.5  EOSABS 0.0 0.1 0.1 0.0 0.0  BASOSABS 0.0 0.0 0.1 0.0 0.0    Recent Labs  Lab 12/26/23 1432 12/26/23 1438 12/26/23 1711 12/26/23 2136 12/27/23 0547 12/27/23 1005 12/28/23 0543 12/29/23 0541 12/30/23 0428  NA 136  --   --   --  137  --  137 138 137  K 3.1*  --   --   --  3.4*  --  3.6 3.7 3.6  CL 100  --   --   --  102  --  101 104 102  CO2 25  --   --   --  25  --  26 26 26   ANIONGAP 11  --   --   --  10  --  10 8 9   GLUCOSE 139*  --   --   --  104*  --  103* 131* 149*  BUN 10  --   --   --  10  --  13 14 20   CREATININE 0.71  --   --   --  0.77  --  0.80 0.82 0.93  AST 19  --   --   --  15  --   --   --  22  ALT 10  --   --   --  9  --   --   --  14  ALKPHOS 75  --   --   --  67  --   --   --  69  BILITOT 5.1*  --   --   --  3.8*  --   --   --  1.4*  ALBUMIN 3.0*  --   --   --  2.6*  --   --   --  2.6*  CRP  --   --   --   --  20.0*  --  15.3* 8.9*  --   PROCALCITON  --   --   --   --  0.10  --  0.10 <0.10  --   LATICACIDVEN  --  2.4* 3.5* 1.3  --   --   --   --   --   INR 2.2*  --   --   --   --   --   --   --   --   TSH  --   --   --   --   --  2.076  --   --   --   BNP  --   --   --   --  396.8*  --  513.0* 732.8*  --   MG 1.8   --   --   --  2.0  --  1.9 2.0 2.0  PHOS  --   --   --   --  2.4*  --   --   --   --   CALCIUM  8.9  --   --   --  8.6*  --  8.2* 8.5* 8.8*      Recent Labs  Lab 12/26/23 1432 12/26/23 1438 12/26/23 1711 12/26/23 2136 12/27/23 0547 12/27/23 1005 12/28/23 0543 12/29/23 0541 12/30/23 0428  CRP  --   --   --   --  20.0*  --  15.3* 8.9*  --   PROCALCITON  --   --   --   --  0.10  --  0.10 <0.10  --   LATICACIDVEN  --  2.4* 3.5* 1.3  --   --   --   --   --   INR 2.2*  --   --   --   --   --   --   --   --   TSH  --   --   --   --   --  2.076  --   --   --   BNP  --   --   --   --  396.8*  --  513.0* 732.8*  --   MG 1.8  --   --   --  2.0  --  1.9 2.0 2.0  CALCIUM  8.9  --   --   --  8.6*  --  8.2* 8.5* 8.8*    --------------------------------------------------------------------------------------------------------------- Lab Results  Component Value Date   CHOL 111 09/16/2023   HDL 40 (L) 09/16/2023   LDLCALC 49 09/16/2023   TRIG 109 09/16/2023   CHOLHDL 2.8 09/16/2023    Lab Results  Component Value Date   HGBA1C 5.9 (H) 11/20/2023   No results for input(s): "TSH", "T4TOTAL", "FREET4", "T3FREE", "THYROIDAB" in the last 72 hours.  No results for input(s): "VITAMINB12", "FOLATE", "FERRITIN", "TIBC", "IRON", "RETICCTPCT" in the last 72 hours.     Micro Results Recent Results (from the past 240 hours)  Blood Culture (routine x 2)     Status: None   Collection Time: 12/26/23  2:30 PM   Specimen: BLOOD RIGHT ARM  Result Value Ref Range Status   Specimen Description BLOOD RIGHT ARM  Final   Special Requests   Final    BOTTLES DRAWN AEROBIC AND ANAEROBIC Blood Culture adequate volume   Culture   Final    NO GROWTH 5 DAYS Performed at [facility], [address], [place], [place] [address]    Report Status 12/31/2023 FINAL  Final  Resp panel by RT-PCR (RSV, Flu A&B, Covid) Anterior Nasal Swab     Status: None   Collection Time: 12/26/23  2:32 PM   Specimen:  Anterior Nasal Swab  Result Value Ref Range Status   SARS Coronavirus 2 by RT PCR NEGATIVE NEGATIVE Final   Influenza A by PCR NEGATIVE NEGATIVE Final   Influenza B by PCR NEGATIVE NEGATIVE Final    Comment: (NOTE) The Xpert Xpress SARS-CoV-2/FLU/RSV plus assay is intended as an aid in the diagnosis of influenza from Nasopharyngeal swab specimens and should not be used as a sole basis for treatment. Nasal washings and aspirates are unacceptable for Xpert Xpress SARS-CoV-2/FLU/RSV testing.  Fact Sheet for Patients: BloggerCourse.com  Fact Sheet for Healthcare Providers: SeriousBroker.it  This test is not yet approved or cleared by the [place]  FDA and has been authorized for detection and/or diagnosis of SARS-CoV-2 by FDA under an Emergency Use Authorization (EUA). This EUA will remain in effect (meaning this test can be used) for the duration of the COVID-19 declaration under Section 564(b)(1) of the Act, 21 U.S.C. section 360bbb-3(b)(1), unless the authorization is terminated or revoked.     Resp Syncytial Virus by PCR NEGATIVE NEGATIVE Final    Comment: (NOTE) Fact Sheet for Patients: BloggerCourse.com  Fact Sheet for Healthcare Providers: SeriousBroker.it  This test is not yet approved or cleared by the [place]  FDA and has been authorized for detection and/or diagnosis of SARS-CoV-2 by FDA under an Emergency Use Authorization (EUA). This EUA will remain in effect (meaning this test can be used) for the duration of the COVID-19 declaration under Section 564(b)(1) of the Act, 21 U.S.C. section 360bbb-3(b)(1), unless the authorization is terminated or revoked.  Performed at [facility], [address] [place], [place] [address]   Blood Culture (routine x 2)     Status: None   Collection Time: 12/26/23  2:44 PM   Specimen: BLOOD RIGHT ARM  Result Value  Ref Range Status   Specimen Description BLOOD RIGHT ARM  Final   Special Requests   Final    BOTTLES DRAWN AEROBIC AND ANAEROBIC Blood Culture adequate volume   Culture   Final    NO GROWTH 5 DAYS Performed  at [facility], [address], [place], [place] [address]    Report Status 12/31/2023 FINAL  Final  MRSA Next Gen by PCR, Nasal     Status: Abnormal   Collection Time: 12/27/23  4:30 AM   Specimen: Nasal Mucosa; Nasal Swab  Result Value Ref Range Status   MRSA by PCR Next Gen DETECTED (A) NOT DETECTED Final    Comment: RESULT CALLED TO, READ BACK BY AND VERIFIED WITH: P BURTON,RN@0643  12/27/23 MK (NOTE) The GeneXpert MRSA Assay (FDA approved for NASAL specimens only), is one component of a comprehensive MRSA colonization surveillance program. It is not intended to diagnose MRSA infection nor to guide or monitor treatment for MRSA infections. Test performance is not FDA approved in patients less than 37 years old. Performed at [facility], [address] [place], [place] [address]   Respiratory (~20 pathogens) panel by PCR     Status: None   Collection Time: 12/27/23  8:42 AM   Specimen: Nasopharyngeal Swab; Respiratory  Result Value Ref Range Status   Adenovirus NOT DETECTED NOT DETECTED Final   Coronavirus 229E NOT DETECTED NOT DETECTED Final    Comment: (NOTE) The Coronavirus on the Respiratory Panel, DOES NOT test for the novel  Coronavirus (2019 nCoV)    Coronavirus HKU1 NOT DETECTED NOT DETECTED Final   Coronavirus NL63 NOT DETECTED NOT DETECTED Final   Coronavirus OC43 NOT DETECTED NOT DETECTED Final   Metapneumovirus NOT DETECTED NOT DETECTED Final   Rhinovirus / Enterovirus NOT DETECTED NOT DETECTED Final   Influenza A NOT DETECTED NOT DETECTED Final   Influenza B NOT DETECTED NOT DETECTED Final   Parainfluenza Virus 1 NOT DETECTED NOT DETECTED Final   Parainfluenza Virus 2 NOT DETECTED NOT DETECTED Final   Parainfluenza Virus 3 NOT DETECTED NOT  DETECTED Final   Parainfluenza Virus 4 NOT DETECTED NOT DETECTED Final   Respiratory Syncytial Virus NOT DETECTED NOT DETECTED Final   Bordetella pertussis NOT DETECTED NOT DETECTED Final   Bordetella Parapertussis NOT DETECTED NOT DETECTED Final   Chlamydophila pneumoniae NOT DETECTED NOT DETECTED Final   Mycoplasma pneumoniae NOT DETECTED NOT DETECTED Final    Comment: Performed at [facility], [address], [place], [place] [address]    Radiology Report No results found.    Signature  -   Seena Dadds M.D on 01/02/2024 at 11:10 AM   -  To page go to [email or web address]

## 2024-01-02 NOTE — Progress Notes (Signed)
 Patient ID: JAMORIAN HATTER, male   DOB: 01-30-1926, 89 y.o.   MRN: [id number]    Progress Note from the Palliative Medicine Team at [facility]   Patient Name: GREGERY DEVINCENZI        Date: 01/02/2024 DOB: 11-08-1925  Age: 89 y.o. MRN#: [id number] Attending Physician: Epifanio Haste, MD Primary Care Physician: Tisovec, Richard W, MD Admit Date: 12/26/2023   Reason for Consultation/Follow-up   Establishing Goals of Care, Terminal Care, Symptom management   HPI/ Brief Hospital Review  89 y.o. male  with past medical history of A-fib on chronic anticoagulation with Eliquis , orthostatic hypotension on midodrine , diabetes mellitus, hypertension, CHF, tachybradycardia syndrome with junctional bradycardia, status post Medtronic dual-chamber PPM, chronic diastolic CHF, CKD stage IIIa with baseline creatinine approximately 1, hyperlipidemia presented to the ED admitted on 12/26/2023 with confusion/altered mental status and agitation punching a wall and sustaining hand pain/injury.    Workup revealed SIRS, acute metabolic encephalopathy, L 4th phalanx fx and possible thumb fx, gallbladder stone. Head CT negative for acute injury; chronic microvascular disease. Concern for CHF vs atypical PNA. Of note, recent admit 11/18/23-11/21/23 with falls, UTI, dehydration; ED admit 4/17 after fall hitting back of head.   Focus of care is comfort and dignity allowing for natural death, foregoing life prolonging measures.   Subjective  Extensive chart review has been completed prior to meeting with patient/family  including labs, vital signs, imaging, progress/consult notes, orders, medications and available advance directive documents.   This NP assessed patient at the bedside as a follow up for palliative medicine needs and emotional support.  Daughter/Elaine at bedside along with 2 other relatives.  Education offered on the natural trajectory and expectations at end-of-life.    Physical exam - occasionally  arouses briefly to name, mostly unresponsive - Audible throat secretions - Poor intake - irregular respiratory pattern - occasional moans    Focus of care to comfort and dignity allowing for natural death.  Plan of care -DNR/DNI - No artificial feeding or hydration now or in the future, comfort feeds with known risk of aspiration - No further diagnostics; labs,  scans - No further IV antibiotics -Symptom management - PO morphine  changed to IV d/t aspiration - scheduled q4hrs -increase robinul  dose, add scop patch - await hospice facility   Unable to get son by phone.   Questions and concerns addressed   Discussed with primary team and nursing staff via secure chat  PMT will continue to support holistically.   Time: 35    minutes  Detailed review of medical records ( labs, imaging, vital signs), medically appropriate exam ( MS, skin, cardiac,  resp)   discussed with treatment team, counseling and education to patient, family, staff, documenting clinical information, medication management, coordination of care   Dina Warbington Milagros Alf, DNP, [facility] Palliative Medicine Team Team Phone # [phone]  Pager # [phone]

## 2024-01-02 NOTE — Progress Notes (Signed)
 [facility] [phone] [facility] hospital liaison note   Visited patient with daughter at bedside. Patient has declined overnight and would now be approved for inpatient hospice care.   Patient's daughter reports that she does not want to move her father at this time and is anticipating a hospital death.  Inpatient team notified.   Thank you for the opportunity to participate in this patient's care.    Madelene Schanz, BSN, RN, [facility] hospital liaison [phone]

## 2024-01-02 NOTE — Progress Notes (Addendum)
 Pt did not eat any dinner. Pt did request OJ when offered a beverage. Pt coughing with each sip. Contacted MD for conversion of PO morphine  to IV. Pt has audible secretions and coarse crackles. MD ordered immediate 1 x dose robinul  and BID PRN robinul . Pt has not voided at all overnight. Pt edematous. Pt remains on 2LNC with O2 sats averaging around 92 with occasional dips.   Bladder Scan performed, pt retaining 307 ml. Foley catheter placed per Comfort care protocol.

## 2024-01-02 NOTE — Progress Notes (Signed)
 Patient is having difficulty clearing secretions. R.N. unable to retrieve the majority of secretions as they are too deep. R.N. discussed the possibility of deep suctioning with patient's son and a 2nd R.N. present. R.N. mentioned it may be possible to get an order from the doctor for this, but it would be a more invasive approach than oral suctioning. Patient's son said he is satisfied with the care being given currently and does not wish to ask the doctor for any other interventions. Patient currently receiving IV 4 mg morphine  q4h scheduled, as well as as IV ativan PRN and 0.2 mg IV robinul  qid. Patient is CMO and expected to pass in the hospital.

## 2024-01-03 DIAGNOSIS — Z515 Encounter for palliative care: Secondary | ICD-10-CM | POA: Diagnosis not present

## 2024-01-03 DIAGNOSIS — R4182 Altered mental status, unspecified: Secondary | ICD-10-CM | POA: Diagnosis not present

## 2024-01-03 DIAGNOSIS — G9341 Metabolic encephalopathy: Secondary | ICD-10-CM | POA: Diagnosis not present

## 2024-01-03 DIAGNOSIS — R6510 Systemic inflammatory response syndrome (SIRS) of non-infectious origin without acute organ dysfunction: Secondary | ICD-10-CM | POA: Diagnosis not present

## 2024-01-03 MED ORDER — GLYCOPYRROLATE 0.2 MG/ML IJ SOLN
0.4000 mg | Freq: Four times a day (QID) | INTRAMUSCULAR | Status: DC
  Administered 2024-01-03 (×2): 0.4 mg via INTRAVENOUS
  Filled 2024-01-03 (×2): qty 2

## 2024-01-03 MED ORDER — SCOPOLAMINE 1 MG/3DAYS TD PT72: 2.0000 | MEDICATED_PATCH | TRANSDERMAL | Status: DC

## 2024-01-03 NOTE — Plan of Care (Signed)
   Problem: Education: Goal: Knowledge of General Education information will improve Description: Including pain rating scale, medication(s)/side effects and non-pharmacologic comfort measures Outcome: Not Progressing   Problem: Health Behavior/Discharge Planning: Goal: Ability to manage health-related needs will improve Outcome: Not Progressing

## 2024-01-03 NOTE — TOC Progression Note (Signed)
 Transition of Care [facility]) - Progression Note    Patient Details  Name: DEMANI MAJEWSKI MRN: [id number] Date of Birth: August 18, 1926  Transition of [facility]) CM/SW Contact  Jannice Mends, LCSW Phone Number: 01/03/2024, 8:41 AM  Clinical Narrative:    CSW continuing to follow for needs. Family requesting patient to remain in the hospital rather than be moved to a Hospice facility.    Expected Discharge Plan: Comfort care Barriers to Discharge: Continued Medical Work up  Expected Discharge Plan and Services In-house Referral: Clinical Social Work   Post Acute Care Choice: Skilled Nursing Facility Living arrangements for the past 2 months: Skilled Nursing Facility                                       Social Determinants of Health (SDOH) Interventions SDOH Screenings   Food Insecurity: Patient Unable To Answer (12/26/2023)  Housing: Patient Unable To Answer (12/26/2023)  Transportation Needs: Patient Unable To Answer (12/26/2023)  Utilities: Patient Unable To Answer (12/26/2023)  Social Connections: Patient Declined (11/18/2023)  Tobacco Use: Medium Risk (12/26/2023)    Readmission Risk Interventions     No data to display

## 2024-01-03 NOTE — Progress Notes (Signed)
 PROGRESS NOTE                                                                                                                                                                                                             Patient Demographics:    Dylan [name], is a 89 y.o. male, DOB - [birth year], UEA:[id number]  Outpatient Primary MD for the patient is Tisovec, Kristina Pfeiffer, MD    LOS - 7  Admit date - 12/26/2023    No chief complaint on file.      Brief Narrative (HPI from H&P)      89 y.o. male with medical history significant of A-fib on chronic anticoagulation with Eliquis , orthostatic hypotension on midodrine , diabetes mellitus, hypertension, CHF, tachybradycardia syndrome with junctional bradycardia, status post Medtronic dual-chamber PPM, chronic diastolic CHF, CKD stage IIIa with baseline creatinine approximately 1, hyperlipidemia presented to the ED with confusion/altered mental status.  Patient noted to have been recently hospitalized 11/18/2023-11/21/2023 for frequent falls, UTI, dehydration.  Apparently he has been getting off-and-on confused at his assisted living facility for the first few days, and asked state of confusion he punched a wall and injured his left hand, he was found to be confused, he had evidence of left hand injury and fractures of the thumb and fourth digit, chest x-ray suggestive of aortic valve infection versus CHF, elevated total bilirubin, clinically appeared dehydrated with elevated lactic acid, there was question of SIRs from unclear source and he was admitted. - Please see discussion below, patient is currently full comfort measures   Subjective:   Patient in bed, remains with significant secretion and gurgling, but overall appears  comfortable   Assessment  & Plan :    Acute on chronic metabolic encephalopathy.   - Patient with likely baseline underlying dementia. - Appears to be having worsening  mental status in the setting of acute illness, but overall as discussed with daughter at bedside, poor baseline, gradual cognitive and functional decline. - Stable TSH, stable B12, unremarkable RPR and HIV testing.  Pneumonia -Significant for multifocal opacity bilaterally -Treated with antibiotics doxycycline and Unasyn - Patient is now comfort care  Cholelithiasis  Hyperbilirubinemia - No evidence of infection on HIDA scan   Mechanical injury with fracture of the left thumb and fourth digit.  Ortho input appreciated.  Dehydration with lactic acidosis.  Resolved with IV fluids, now has evidence of fluid overload, TED stockings and gentle diuresis.  Hypokalemia and hypophosphatemia.  Replaced.  Ongoing right shoulder, left hip and neck pain.  X-rays of the right shoulder and left hip nonacute, CT scan of the C-spine noted, has been seen by orthopedics, supportive care for his left hand injuries, discussed his next CT findings with ENT Dr. Virgia Griffins, no intervention needed, overall stable except for mild neck pain, likely due to chronic arthritis, C-spine itself was nonacute, on steroids  Paroxysmal atrial fibrillation.  [place] vas 2 score of greater than 3.   - Comfort care, off anticoagulation  History of dysphagia.  On dysphagia 3 diet at ALF, speech input, at risk for ongoing aspiration.  Acute on chronic  diastolic CHF EF 60%.  Has evidence of fluid overload with some acute on chronic decompensation, gentle diuresis as tolerated by blood pressure.  Foley catheter placement in the ER.  Reason unclear, no documented urinary retention, Flomax Foley catheter discontinued 12/28/2023.  Monitor.  DM type II.  Diet controlled, A1c 5.9 recently.  Goals of care discussion - Patient with continuous decline, mentally and physically, palliative medicine input platelet appreciated, upon discussion with family plan to proceed with full comfort measures, currently on comfort measures, his p.o. morphine   regimen has been changed to IV 4 mg every 6 hours overnight due to swallowing issues, he appears to be uncomfortable with morning this morning so I have increased it to every 4 hours, some agitation and restlessness so I have added some as needed Ativan and haloperidol, with significant oral secretion and gurgling as well despite keeping him on scheduled glycopyrrolate  and started scopolamine patch, I will double his glycopyrrolate  dosing and scopolamine, discussed with family NTS will be very uncomfortable at this stage without much benefit regarding comfort, he is having more apneic spells, I do anticipate hospital death .         Condition -comfort  Family Communication  :    Discussed with daughter [name] and son at bedside  Code Status :  DNR, death expected during hospital stay  Consults  :  Ortho, [facility] Dr. Karene Oto, [facility] ENT Dr. Virgia Griffins, palliative medicine    Procedures  :     HIDA scan.  Nonacute.  CT C-spine.  1. No acute cervical spine fracture or destructive process. 2. New mild retropharyngeal soft tissue swelling and/or fluid, indeterminate. This could be secondary to infection/inflammation of uncertain etiology or potentially related to generalized volume overload given partially visualized pleural effusions and body wall edema. No organized fluid collection. 3. Advanced cervical disc and facet degeneration with mild-to-moderate spinal stenosis and moderate to severe neural foraminal stenosis as above  RUQ - Distended gallbladder with a stone. No further sonographic evidence of acute cholecystitis at this time. No ductal dilatation. Right-sided pleural effusion  CT Abd Pelvis - Cardiomegaly, coronary artery disease.  Aortic atherosclerosis. Small bilateral effusions with compressive atelectasis in the lower lobes. Cholelithiasis.  No evidence for acute cholecystitis. No acute findings in the abdomen or pelvis.  CT head - Non acute      Disposition Plan  :    Status is:  Inpt  DVT Prophylaxis  :    Place TED hose Start: 12/27/23 0814 Lovenox     Lab Results  Component Value Date   PLT 174 12/30/2023    Diet :  Diet Order             Diet regular Room service appropriate?  Yes; Fluid consistency: Thin  Diet effective now                    Inpatient Medications  Scheduled Meds:  fluticasone  2 spray Each Nare Daily   Gerhardt's butt cream   Topical BID   glycopyrrolate   0.4 mg Intravenous QID   ipratropium  1 spray Each Nare BID   leptospermum manuka honey  1 Application Topical Daily   lidocaine   1 patch Transdermal Q24H   loratadine  10 mg Oral Daily    morphine  injection  4 mg Intravenous Q4H   [START ON 01/05/2024] scopolamine  2 patch Transdermal Q72H   senna  1 tablet Oral BID   sodium chloride  flush  3 mL Intravenous Q12H   tamsulosin  0.4 mg Oral Daily   Continuous Infusions:   PRN Meds:.acetaminophen , haloperidol lactate, HYDROcodone -acetaminophen , levalbuterol , LORazepam, [DISCONTINUED] ondansetron  **OR** ondansetron  (ZOFRAN ) IV, polyethylene glycol, sorbitol     Objective:   Vitals:   01/02/24 0400 01/02/24 0849 01/02/24 2014 01/03/24 0804  BP:  121/86 125/70 101/68  Pulse: (!) 107 83 99 89  Resp:      Temp:  98.2 F (36.8 C) 98 F (36.7 C) 99.3 F (37.4 C)  TempSrc:  Axillary Axillary Axillary  SpO2: (!) 84% 92% (!) 88% (!) 86%  Weight:      Height:        Wt Readings from Last 3 Encounters:  01/01/24 111.7 kg  11/18/23 104.3 kg  11/14/23 104.3 kg     Intake/Output Summary (Last 24 hours) at 01/03/2024 1519 Last data filed at 01/03/2024 1420 Gross per 24 hour  Intake --  Output 710 ml  Net -710 ml     Physical Exam  Obtundent, responsive, diminished air entry, with apneic spell, appears with significant gurgling and upper airway secretions .   RN pressure injury documentation: Pressure Injury 06/10/22 Buttocks Left;Lower;Anterior Stage 2 -  Partial thickness loss of dermis presenting as a  shallow open injury with a red, pink wound bed without slough. small size of a pea (Active)  06/10/22 1849  Location: Buttocks  Location Orientation: Left;Lower;Anterior  Staging: Stage 2 -  Partial thickness loss of dermis presenting as a shallow open injury with a red, pink wound bed without slough.  Wound Description (Comments): small size of a pea  Present on Admission: Yes  Dressing Type Foam - Lift dressing to assess site every shift 01/03/24 0900      Data Review:    Recent Labs  Lab 12/28/23 0543 12/29/23 0541 12/30/23 0428  WBC 6.2 5.3 6.3  HGB 10.3* 11.2* 10.7*  HCT 32.0* 34.6* 33.7*  PLT 128* 169 174  MCV 99.7 98.0 98.8  MCH 32.1 31.7 31.4  MCHC 32.2 32.4 31.8  RDW 14.2 13.9 13.7  LYMPHSABS 1.0 0.9 1.0  MONOABS 0.6 0.5 0.5  EOSABS 0.1 0.0 0.0  BASOSABS 0.1 0.0 0.0    Recent Labs  Lab 12/28/23 0543 12/29/23 0541 12/30/23 0428  NA 137 138 137  K 3.6 3.7 3.6  CL 101 104 102  CO2 26 26 26   ANIONGAP 10 8 9   GLUCOSE 103* 131* 149*  BUN 13 14 20   CREATININE 0.80 0.82 0.93  AST  --   --  22  ALT  --   --  14  ALKPHOS  --   --  69  BILITOT  --   --  1.4*  ALBUMIN  --   --  2.6*  CRP 15.3* 8.9*  --   PROCALCITON 0.10 <0.10  --   BNP 513.0* 732.8*  --   MG 1.9 2.0 2.0  CALCIUM  8.2* 8.5* 8.8*      Recent Labs  Lab 12/28/23 0543 12/29/23 0541 12/30/23 0428  CRP 15.3* 8.9*  --   PROCALCITON 0.10 <0.10  --   BNP 513.0* 732.8*  --   MG 1.9 2.0 2.0  CALCIUM  8.2* 8.5* 8.8*    --------------------------------------------------------------------------------------------------------------- Lab Results  Component Value Date   CHOL 111 09/16/2023   HDL 40 (L) 09/16/2023   LDLCALC 49 09/16/2023   TRIG 109 09/16/2023   CHOLHDL 2.8 09/16/2023    Lab Results  Component Value Date   HGBA1C 5.9 (H) 11/20/2023   No results for input(s): "TSH", "T4TOTAL", "FREET4", "T3FREE", "THYROIDAB" in the last 72 hours.  No results for input(s): "VITAMINB12",  "FOLATE", "FERRITIN", "TIBC", "IRON", "RETICCTPCT" in the last 72 hours.     Micro Results Recent Results (from the past 240 hours)  Blood Culture (routine x 2)     Status: None   Collection Time: 12/26/23  2:30 PM   Specimen: BLOOD RIGHT ARM  Result Value Ref Range Status   Specimen Description BLOOD RIGHT ARM  Final   Special Requests   Final    BOTTLES DRAWN AEROBIC AND ANAEROBIC Blood Culture adequate volume   Culture   Final    NO GROWTH 5 DAYS Performed at [facility], [address], [place], [place] [address]    Report Status 12/31/2023 FINAL  Final  Resp panel by RT-PCR (RSV, Flu A&B, Covid) Anterior Nasal Swab     Status: None   Collection Time: 12/26/23  2:32 PM   Specimen: Anterior Nasal Swab  Result Value Ref Range Status   SARS Coronavirus 2 by RT PCR NEGATIVE NEGATIVE Final   Influenza A by PCR NEGATIVE NEGATIVE Final   Influenza B by PCR NEGATIVE NEGATIVE Final    Comment: (NOTE) The Xpert Xpress SARS-CoV-2/FLU/RSV plus assay is intended as an aid in the diagnosis of influenza from Nasopharyngeal swab specimens and should not be used as a sole basis for treatment. Nasal washings and aspirates are unacceptable for Xpert Xpress SARS-CoV-2/FLU/RSV testing.  Fact Sheet for Patients: BloggerCourse.com  Fact Sheet for Healthcare Providers: SeriousBroker.it  This test is not yet approved or cleared by the [place]  FDA and has been authorized for detection and/or diagnosis of SARS-CoV-2 by FDA under an Emergency Use Authorization (EUA). This EUA will remain in effect (meaning this test can be used) for the duration of the COVID-19 declaration under Section 564(b)(1) of the Act, 21 U.S.C. section 360bbb-3(b)(1), unless the authorization is terminated or revoked.     Resp Syncytial Virus by PCR NEGATIVE NEGATIVE Final    Comment: (NOTE) Fact Sheet for  Patients: BloggerCourse.com  Fact Sheet for Healthcare Providers: SeriousBroker.it  This test is not yet approved or cleared by the [place]  FDA and has been authorized for detection and/or diagnosis of SARS-CoV-2 by FDA under an Emergency Use Authorization (EUA). This EUA will remain in effect (meaning this test can be used) for the duration of the COVID-19 declaration under Section 564(b)(1) of the Act, 21 U.S.C. section 360bbb-3(b)(1), unless the authorization is terminated or revoked.  Performed at [facility], [address], [place], [place] [address]   Blood Culture (routine x 2)     Status: None   Collection Time: 12/26/23  2:44 PM   Specimen: BLOOD  RIGHT ARM  Result Value Ref Range Status   Specimen Description BLOOD RIGHT ARM  Final   Special Requests   Final    BOTTLES DRAWN AEROBIC AND ANAEROBIC Blood Culture adequate volume   Culture   Final    NO GROWTH 5 DAYS Performed at [facility], [address] [place], [place] [address]    Report Status 12/31/2023 FINAL  Final  MRSA Next Gen by PCR, Nasal     Status: Abnormal   Collection Time: 12/27/23  4:30 AM   Specimen: Nasal Mucosa; Nasal Swab  Result Value Ref Range Status   MRSA by PCR Next Gen DETECTED (A) NOT DETECTED Final    Comment: RESULT CALLED TO, READ BACK BY AND VERIFIED WITH: P BURTON,RN@0643  12/27/23 MK (NOTE) The GeneXpert MRSA Assay (FDA approved for NASAL specimens only), is one component of a comprehensive MRSA colonization surveillance program. It is not intended to diagnose MRSA infection nor to guide or monitor treatment for MRSA infections. Test performance is not FDA approved in patients less than 63 years old. Performed at [facility], [address], [place], [place] [address]   Respiratory (~20 pathogens) panel by PCR     Status: None   Collection Time: 12/27/23  8:42 AM   Specimen: Nasopharyngeal Swab;  Respiratory  Result Value Ref Range Status   Adenovirus NOT DETECTED NOT DETECTED Final   Coronavirus 229E NOT DETECTED NOT DETECTED Final    Comment: (NOTE) The Coronavirus on the Respiratory Panel, DOES NOT test for the novel  Coronavirus (2019 nCoV)    Coronavirus HKU1 NOT DETECTED NOT DETECTED Final   Coronavirus NL63 NOT DETECTED NOT DETECTED Final   Coronavirus OC43 NOT DETECTED NOT DETECTED Final   Metapneumovirus NOT DETECTED NOT DETECTED Final   Rhinovirus / Enterovirus NOT DETECTED NOT DETECTED Final   Influenza A NOT DETECTED NOT DETECTED Final   Influenza B NOT DETECTED NOT DETECTED Final   Parainfluenza Virus 1 NOT DETECTED NOT DETECTED Final   Parainfluenza Virus 2 NOT DETECTED NOT DETECTED Final   Parainfluenza Virus 3 NOT DETECTED NOT DETECTED Final   Parainfluenza Virus 4 NOT DETECTED NOT DETECTED Final   Respiratory Syncytial Virus NOT DETECTED NOT DETECTED Final   Bordetella pertussis NOT DETECTED NOT DETECTED Final   Bordetella Parapertussis NOT DETECTED NOT DETECTED Final   Chlamydophila pneumoniae NOT DETECTED NOT DETECTED Final   Mycoplasma pneumoniae NOT DETECTED NOT DETECTED Final    Comment: Performed at [facility], [address], [place], [place] [address]    Radiology Report No results found.    Signature  -   Seena Dadds M.D on 01/03/2024 at 3:19 PM   -  To page go to [email or web address]

## 2024-01-03 NOTE — Plan of Care (Signed)

## 2024-01-03 NOTE — Progress Notes (Addendum)
 Patient ID: Dylan [name], male   DOB: 1926/07/14, 89 y.o.   MRN: [id number]    Progress Note from the Palliative Medicine Team at [facility]   Patient Name: Dylan [name]        Date: 01/03/2024 DOB: Aug 28, 1925  Age: 89 y.o. MRN#: [id number] Attending Physician: Dylan Haste, MD Primary Care Physician: [name], Dylan W, MD Admit Date: 12/26/2023   Reason for Consultation/Follow-up   Establishing Goals of Care, Terminal Care, Symptom management   HPI/ Brief Hospital Review  89 y.o. male  with past medical history of A-fib on chronic anticoagulation with Eliquis , orthostatic hypotension on midodrine , diabetes mellitus, hypertension, CHF, tachybradycardia syndrome with junctional bradycardia, status post Medtronic dual-chamber PPM, chronic diastolic CHF, CKD stage IIIa with baseline creatinine approximately 1, hyperlipidemia presented to the ED admitted on 12/26/2023 with confusion/altered mental status and agitation punching a wall and sustaining hand pain/injury.    Workup revealed SIRS, acute metabolic encephalopathy, L 4th phalanx fx and possible thumb fx, gallbladder stone. Head CT negative for acute injury; chronic microvascular disease. Concern for CHF vs atypical PNA. Of note, recent admit 11/18/23-11/21/23 with falls, UTI, dehydration; ED admit 4/17 after fall hitting back of head.   Focus of care is comfort and dignity allowing for natural death, foregoing life prolonging measures.   Subjective  Extensive chart review has been completed prior to meeting with patient/family  including labs, vital signs, imaging, progress/consult notes, orders, medications and available advance directive documents.   This NP assessed patient at the bedside as a follow up for palliative medicine needs and emotional support.  Daughter/Dylan [name], son Dylan [name], and DIL Dylan [name] at bedside.  Education offered on the natural trajectory and expectations at end-of-life.    Physical exam - unresponsive - does  bite yankauer when suctioning - Audible throat secretions - continue robinul  and repositioning - no intake - irregular respiratory pattern    Focus of care to comfort and dignity allowing for natural death.  Discussed freeing from supplemental oxygen (1L) which family agreed to.  Plan of care -DNR/DNI - No artificial feeding or hydration now or in the future, comfort feeds with known risk of aspiration - No further diagnostics; labs,  scans - No further IV antibiotics -Symptom management: continue scheduled IV morphine , robinul , and scop patch - anticipate  hospital death  Questions and concerns addressed   Discussed with primary team and nursing staff via secure chat  PMT will continue to support holistically.   Time: 40    minutes  Detailed review of medical records ( labs, imaging, vital signs), medically appropriate exam ( MS, skin, cardiac,  resp)   discussed with treatment team, counseling and education to patient, family, staff, documenting clinical information, medication management, coordination of care   Dylan Cuda Milagros Alf, DNP, [facility] Palliative Medicine Team Team Phone # [phone]  Pager # [phone]

## 2024-01-03 NOTE — Progress Notes (Signed)
 [facility] [phone] [facility] Liaison Note  Met with patient and family at bedside. Patient with audible secretions, unresponsive.  Family without questions and appreciative of visit.  We will continue to follow and support.  Please call with any hospice related questions or concerns.  Thank you, Lestine Rathke, BSN, [facility] Team [phone]

## 2024-01-04 DIAGNOSIS — R6510 Systemic inflammatory response syndrome (SIRS) of non-infectious origin without acute organ dysfunction: Secondary | ICD-10-CM | POA: Diagnosis not present

## 2024-01-04 DIAGNOSIS — R4182 Altered mental status, unspecified: Secondary | ICD-10-CM | POA: Diagnosis not present

## 2024-01-08 ENCOUNTER — Ambulatory Visit: Payer: Medicare HMO | Admitting: Podiatry

## 2024-01-15 ENCOUNTER — Ambulatory Visit: Payer: Self-pay | Admitting: Cardiology

## 2024-01-19 NOTE — Death Summary Note (Signed)
 DEATH SUMMARY   Patient Details  Name: DECORIAN SCHUENEMANN MRN: [id number] DOB: 05/23/1926 [id number], Kristina Pfeiffer, MD Admission/Discharge Information   Admit Date:  January 19, 2024  Date of Death: Date of Death: 28-Jan-2024  Time of Death: Time of Death: 0719  Length of Stay: 8   Principle Cause of death: ***  Hospital Diagnoses: Principal Problem:   SIRS (systemic inflammatory response syndrome) (HCC) Active Problems:   Acute metabolic encephalopathy   DOE (dyspnea on exertion)   Benign essential HTN   Type 2 diabetes mellitus without complication (HCC)   Dyslipidemia   Atrial fibrillation (HCC)   CAP (community acquired pneumonia)   Chronic diastolic CHF (congestive heart failure) (HCC)   Type 2 diabetes mellitus with vascular disease (HCC)   End of life care   Pure hypercholesterolemia   PVD (peripheral vascular disease) (HCC)   Orthostatic intolerance   Hypokalemia   Lactic acidosis   Dehydration   Hyperbilirubinemia   Thumb fracture   Closed fracture of distal phalanx of digit of left hand   Hospital Course: No notes on file  Assessment and Plan: No notes have been filed under this hospital service. Service: Hospitalist     {Tip this will not be part of the note when signed Body mass index is 33.4 kg/m. , ,  Active Pressure Injury/Wound(s)     Pressure Ulcer  Duration          Pressure Injury 06/10/22 Buttocks Left;Lower;Anterior Stage 2 -  Partial thickness loss of dermis presenting as a shallow open injury with a red, pink wound bed without slough. small size of a pea 572 days           (Optional):26781}   Procedures: ***  Consultations: ***  The results of significant diagnostics from this hospitalization (including imaging, microbiology, ancillary and laboratory) are listed below for reference.   Significant Diagnostic Studies: NM Hepatobiliary Liver Func Result Date: 12/28/2023 CLINICAL DATA:  Evaluate for scratch set biliary colic. Evaluate for  cholecystitis. EXAM: NUCLEAR MEDICINE HEPATOBILIARY IMAGING TECHNIQUE: Sequential images of the abdomen were obtained out to 60 minutes following intravenous administration of radiopharmaceutical. RADIOPHARMACEUTICALS:  5.0 mCi Tc-10m  Choletec  IV COMPARISON:  Right upper quadrant sonogram from 2024-01-19 FINDINGS: Prompt uptake and biliary excretion of activity by the liver is seen. Gallbladder activity is visualized, consistent with patency of cystic duct. Biliary activity passes into small bowel, consistent with patent common bile duct. IMPRESSION: 1. Normal exam. Electronically Signed   By: Kimberley Penman M.D.   On: 12/28/2023 12:25   CT CERVICAL SPINE W CONTRAST Result Date: 12/27/2023 CLINICAL DATA:  Chronic neck pain. EXAM: CT CERVICAL SPINE WITH CONTRAST TECHNIQUE: Multidetector CT imaging of the cervical spine was performed during intravenous contrast administration. Multiplanar CT image reconstructions were also generated. RADIATION DOSE REDUCTION: This exam was performed according to the departmental dose-optimization program which includes automated exposure control, adjustment of the mA and/or kV according to patient size and/or use of iterative reconstruction technique. CONTRAST:  75mL OMNIPAQUE  IOHEXOL  350 MG/ML SOLN COMPARISON:  CT cervical spine 12/05/2023 FINDINGS: Alignment: Unchanged reversal of the normal cervical lordosis and trace anterolisthesis of C3 on C4 and C4 on C5. Skull base and vertebrae: No acute fracture or suspicious lesion. Moderate atlantodental degenerative changes with ligamentous hypertrophy resulting in at least mild spinal stenosis. Soft tissues and spinal canal: New mild retropharyngeal soft tissue swelling and/or minimal fluid. No organized fluid collection. No visible spinal canal hematoma. Disc levels: Similar appearance of advanced mid  and lower cervical disc degeneration and upper cervical facet arthrosis. Interbody ankylosis at C5-6 and C6-7. Relatively diffuse,  mild cervical spinal canal stenosis with potentially moderate stenosis at C4-5. Moderate to severe neural foraminal stenosis bilaterally at C2-3 and on the right at C3-4 and C4-5. Upper chest: Partially visualized bilateral pleural effusions, increased from prior. Partially visualized body wall edema. Other: Major vessels of the neck are patent. Mild atherosclerosis at the right greater than left carotid bifurcations. IMPRESSION: 1. No acute cervical spine fracture or destructive process. 2. New mild retropharyngeal soft tissue swelling and/or fluid, indeterminate. This could be secondary to infection/inflammation of uncertain etiology or potentially related to generalized volume overload given partially visualized pleural effusions and body wall edema. No organized fluid collection. 3. Advanced cervical disc and facet degeneration with mild-to-moderate spinal stenosis and moderate to severe neural foraminal stenosis as above. Electronically Signed   By: Aundra Lee M.D.   On: 12/27/2023 14:51   DG Shoulder Right Result Date: 12/27/2023 CLINICAL DATA:  Right shoulder pain. EXAM: RIGHT SHOULDER - 2+ VIEW COMPARISON:  November 18, 2023. FINDINGS: There is no evidence of fracture or dislocation. Severe narrowing of glenohumeral joint is noted. Soft tissues are unremarkable. IMPRESSION: Severe osteoarthritis of right glenohumeral joint. No acute abnormality seen. Electronically Signed   By: Rosalene Colon M.D.   On: 12/27/2023 10:42   DG HIP UNILAT WITH PELVIS 2-3 VIEWS LEFT Result Date: 12/27/2023 CLINICAL DATA:  Left hip pain. EXAM: DG HIP (WITH OR WITHOUT PELVIS) 2-3V LEFT COMPARISON:  None Available. FINDINGS: There is no evidence of hip fracture or dislocation. Mild osteophyte formation of left hip is noted. IMPRESSION: Mild degenerative joint disease of left hip. No acute abnormality seen. Electronically Signed   By: Rosalene Colon M.D.   On: 12/27/2023 10:40   DG Chest Port 1 View Result Date:  12/27/2023 CLINICAL DATA:  Shortness of breath. EXAM: PORTABLE CHEST 1 VIEW COMPARISON:  12/26/2023 FINDINGS: The cardio pericardial silhouette is enlarged. Diffuse interstitial and basilar airspace disease, left greater than right, is similar to prior. Left-sided permanent pacemaker again noted. No acute bony abnormality. Telemetry leads overlie the chest. IMPRESSION: Diffuse interstitial and basilar airspace disease, left greater than right, similar to prior. Imaging features could be related to interstitial edema with basilar atelectasis or infiltrate. Electronically Signed   By: Donnal Fusi M.D.   On: 12/27/2023 06:57   US  Abdomen Limited RUQ (LIVER/GB) Result Date: 12/26/2023 CLINICAL DATA:  Sepsis. EXAM: ULTRASOUND ABDOMEN LIMITED RIGHT UPPER QUADRANT COMPARISON:  CT 12/26/2023. FINDINGS: Gallbladder: Dilated gallbladder. Dependent stone. No wall thickening or adjacent fluid. No reported sonographic Murphy's sign. As per the sonographer the patient was unable to undergo left lateral decubitus imaging at this time due to clinical condition. Common bile duct: Diameter: 4 mm Liver: No focal lesion identified. Within normal limits in parenchymal echogenicity. Portal vein is patent on color Doppler imaging with normal direction of blood flow towards the liver. Other: Right-sided pleural effusion. IMPRESSION: Distended gallbladder with a stone. No further sonographic evidence of acute cholecystitis at this time. No ductal dilatation. Right-sided pleural effusion Electronically Signed   By: Adrianna Horde M.D.   On: 12/26/2023 18:13   CT ABDOMEN PELVIS W CONTRAST Result Date: 12/26/2023 CLINICAL DATA:  Abdominal pain EXAM: CT ABDOMEN AND PELVIS WITH CONTRAST TECHNIQUE: Multidetector CT imaging of the abdomen and pelvis was performed using the standard protocol following bolus administration of intravenous contrast. RADIATION DOSE REDUCTION: This exam was performed according  to the departmental dose-optimization  program which includes automated exposure control, adjustment of the mA and/or kV according to patient size and/or use of iterative reconstruction technique. CONTRAST:  75mL OMNIPAQUE  IOHEXOL  350 MG/ML SOLN COMPARISON:  06/10/2022 FINDINGS: Lower chest: Cardiomegaly. Pacer wires in the right heart. Coronary artery and aortic atherosclerosis. Small bilateral pleural effusions. Dependent atelectasis in the lower lobes. Hepatobiliary: No focal hepatic abnormality. Small layering gallstone within the gallbladder. No biliary ductal dilatation. Pancreas: No focal abnormality or ductal dilatation. Spleen: No focal abnormality.  Normal size. Adrenals/Urinary Tract: Bilateral renal cysts are stable and appear benign. No follow-up imaging recommended. No stones or hydronephrosis. Adrenal glands and urinary bladder unremarkable. Urinary bladder decompressed with Foley catheter in place. Stomach/Bowel: Stomach, large and small bowel grossly unremarkable. Vascular/Lymphatic: Aortoiliac atherosclerosis. No evidence of aneurysm or adenopathy. Reproductive: No visible focal abnormality. Other: No free fluid or free air. Musculoskeletal: No acute bony abnormality. IMPRESSION: Cardiomegaly, coronary artery disease.  Aortic atherosclerosis. Small bilateral effusions with compressive atelectasis in the lower lobes. Cholelithiasis.  No evidence for acute cholecystitis. No acute findings in the abdomen or pelvis. Electronically Signed   By: Janeece Mechanic M.D.   On: 12/26/2023 17:17   CT Head Wo Contrast Result Date: 12/26/2023 CLINICAL DATA:  Mental status changes EXAM: CT HEAD WITHOUT CONTRAST TECHNIQUE: Contiguous axial images were obtained from the base of the skull through the vertex without intravenous contrast. RADIATION DOSE REDUCTION: This exam was performed according to the departmental dose-optimization program which includes automated exposure control, adjustment of the mA and/or kV according to patient size and/or use of  iterative reconstruction technique. COMPARISON:  None Available. FINDINGS: Brain: There is atrophy and chronic small vessel disease changes. No acute intracranial abnormality. Specifically, no hemorrhage, hydrocephalus, mass lesion, acute infarction, or significant intracranial injury. Vascular: No hyperdense vessel or unexpected calcification. Skull: No acute calvarial abnormality. Sinuses/Orbits: No acute findings Other: None IMPRESSION: Atrophy, chronic microvascular disease. No acute intracranial abnormality. Electronically Signed   By: Janeece Mechanic M.D.   On: 12/26/2023 17:13   DG Hand Complete Left Result Date: 12/26/2023 CLINICAL DATA:  Trauma to the left upper extremity.  Pain. EXAM: LEFT HAND - COMPLETE 3+ VIEW; LEFT ELBOW - COMPLETE 3+ VIEW; LEFT FOREARM - 2 VIEW COMPARISON:  None Available. FINDINGS: Lucency through the base of the distal phalanx of the thumb suboptimally evaluated due to positioning. This may be chronic however, an acute fracture is not excluded. Correlation with clinical exam recommended. Dedicated radiograph of the thumb may provide better evaluation if there is clinical concern for fracture. There is an angulated fracture of the proximal phalanx of the fourth digit with dorsal angulation of the distal fracture fragment. No other acute fracture. No dislocation. The bones are osteopenic. Soft tissue swelling of the wrist. No radiopaque foreign object or soft tissue gas. IMPRESSION: 1. Angulated fracture of the proximal phalanx of the fourth digit. 2. Possible nondisplaced fracture of the base of the distal phalanx of the thumb. Evaluation of the hand is limited due to suboptimal positioning. Electronically Signed   By: Angus Bark M.D.   On: 12/26/2023 15:52   DG Forearm Left Result Date: 12/26/2023 CLINICAL DATA:  Trauma to the left upper extremity.  Pain. EXAM: LEFT HAND - COMPLETE 3+ VIEW; LEFT ELBOW - COMPLETE 3+ VIEW; LEFT FOREARM - 2 VIEW COMPARISON:  None Available.  FINDINGS: Lucency through the base of the distal phalanx of the thumb suboptimally evaluated due to positioning. This may be chronic however,  an acute fracture is not excluded. Correlation with clinical exam recommended. Dedicated radiograph of the thumb may provide better evaluation if there is clinical concern for fracture. There is an angulated fracture of the proximal phalanx of the fourth digit with dorsal angulation of the distal fracture fragment. No other acute fracture. No dislocation. The bones are osteopenic. Soft tissue swelling of the wrist. No radiopaque foreign object or soft tissue gas. IMPRESSION: 1. Angulated fracture of the proximal phalanx of the fourth digit. 2. Possible nondisplaced fracture of the base of the distal phalanx of the thumb. Evaluation of the hand is limited due to suboptimal positioning. Electronically Signed   By: Angus Bark M.D.   On: 12/26/2023 15:52   DG Elbow Complete Left Result Date: 12/26/2023 CLINICAL DATA:  Trauma to the left upper extremity.  Pain. EXAM: LEFT HAND - COMPLETE 3+ VIEW; LEFT ELBOW - COMPLETE 3+ VIEW; LEFT FOREARM - 2 VIEW COMPARISON:  None Available. FINDINGS: Lucency through the base of the distal phalanx of the thumb suboptimally evaluated due to positioning. This may be chronic however, an acute fracture is not excluded. Correlation with clinical exam recommended. Dedicated radiograph of the thumb may provide better evaluation if there is clinical concern for fracture. There is an angulated fracture of the proximal phalanx of the fourth digit with dorsal angulation of the distal fracture fragment. No other acute fracture. No dislocation. The bones are osteopenic. Soft tissue swelling of the wrist. No radiopaque foreign object or soft tissue gas. IMPRESSION: 1. Angulated fracture of the proximal phalanx of the fourth digit. 2. Possible nondisplaced fracture of the base of the distal phalanx of the thumb. Evaluation of the hand is limited due to  suboptimal positioning. Electronically Signed   By: Angus Bark M.D.   On: 12/26/2023 15:52   DG Chest Port 1 View Result Date: 12/26/2023 CLINICAL DATA:  Questionable sepsis, evaluate for abnormality. Altered mental status. EXAM: PORTABLE CHEST 1 VIEW COMPARISON:  Radiographs 06/10/2022 and 08/09/2019.  CT 06/10/2022. FINDINGS: 1446 hours. Two views submitted. Left subclavian pacemaker leads project over the right atrium and right ventricle. The heart size and mediastinal contours are stable with aortic atherosclerosis. Increased vascular congestion with probable patchy atelectasis at both lung bases. No overt pulmonary edema, confluent airspace disease, pneumothorax or significant pleural effusion. No acute osseous findings are seen. There are degenerative changes throughout the spine. IMPRESSION: Increased vascular congestion with probable patchy atelectasis at both lung bases. No definite evidence of pneumonia. Electronically Signed   By: Elmon Hagedorn M.D.   On: 12/26/2023 15:00   CUP PACEART REMOTE DEVICE CHECK Result Date: 12/26/2023 PPM Scheduled remote reviewed. Normal device function.  Presenting rhythm:  AF/AFL/VS Permanent AF, not always good rate control, subtle increase in HR's per trends, Eliquis  per PA report Next remote 91 days. LA, CVRS  DG SWALLOW FUNC OP MEDICARE SPEECH PATH Result Date: 12/17/2023 Table formatting from the original result was not included. Modified Barium Swallow Study Patient Details Name: LEONA PRESSLY MRN: [id number] Date of Birth: [birth year] Today's Date: 12/17/2023 HPI/PMH: HPI: Pt is a 89 y.o. male known to SLP services with recent MBS on 11/20/23. There was both sensed and silent aspiration of thin liquids before the swallow  He returns for a repeat MBS as an OP to determine any changes in swallow physiology since last exam.  He currently resides at [facility].  PMHx a-fib, orthostatic hypotension, DM, HTN, CHF, CHF, CKD stage IIIa. He has had multiple falls in  recent months. CT cervical spine 12/05/23: Severe multilevel degenerative change. Endplate spurring at multiple levels. His son Georgette Kins is with him today. Pt has been drinking nectar-thick liquids since 4/2 and is hoping diet will be liberalized today. Clinical Impression: Clinical Impression: Mr. Crysler presents with improved oropharyngeal swallow, likely due to improved overall strength and conditioning since his prior MBS while he was an inpatient. Today, there was one notable incident of aspiration that was silent and occurred when swallowing larger boluses. Trials of chin tuck offered extra protection with no aspiration observed. Overall, mastication was mildly prolonged but functional. There was mild vallecular and pyriform residue after the swallows. Degenerative changes in cervical vertebrae/anterior spurring no longer appeared to be an obstacle that the pt's musculature couldn't overcome. Recommend that Mr. Mader resume thin liquids - a chin tuck will offer improved protection against aspiration.  Continue regular solids.  Future hospitalizations and deconditioning may lead to changes in swallowing safety as occurred during April admission this year; for now, swallowing appears to have recovered. Results and recs were D/W Mr. Hommel and his son, Georgette Kins, after the study. They verbalized agreement with plan. Factors that may increase risk of adverse event in presence of aspiration Roderick Civatte & Jessy Morocco 2021): Factors that may increase risk of adverse event in presence of aspiration Roderick Civatte & Jessy Morocco 2021): Limited mobility Recommendations/Plan: Swallowing Evaluation Recommendations Swallowing Evaluation Recommendations Recommendations: PO diet PO Diet Recommendation: Regular; Thin liquids (Level 0) Liquid Administration via: Cup; Straw Medication Administration: Whole meds with puree Supervision: Patient able to self-feed Swallowing strategies  : Small bites/sips; Chin tuck Oral care recommendations: Oral care BID  (2x/day) Treatment Plan Treatment Plan Treatment recommendations: Defer treatment plan to SLP at other venue (see follow-up recommendations) Follow-up recommendations: Skilled nursing-short term rehab (<3 hours/day) Recommendations Recommendations for follow up therapy are one component of a multi-disciplinary discharge planning process, led by the attending physician.  Recommendations may be updated based on patient status, additional functional criteria and insurance authorization. Assessment: Orofacial Exam: Orofacial Exam Oral Cavity: Oral Hygiene: WFL Oral Motor/Sensory Function: WFL Anatomy: Anatomy: Suspected cervical osteophytes Boluses Administered: Boluses Administered Boluses Administered: Thin liquids (Level 0); Mildly thick liquids (Level 2, nectar thick); Puree; Solid  Oral Impairment Domain: Oral Impairment Domain Lip Closure: No labial escape Tongue control during bolus hold: Cohesive bolus between tongue to palatal seal Bolus preparation/mastication: Slow prolonged chewing/mashing with complete recollection Bolus transport/lingual motion: Brisk tongue motion Oral residue: Complete oral clearance Location of oral residue : N/A Initiation of pharyngeal swallow : Posterior laryngeal surface of the epiglottis  Pharyngeal Impairment Domain: Pharyngeal Impairment Domain Soft palate elevation: No bolus between soft palate (SP)/pharyngeal wall (PW) Laryngeal elevation: Complete superior movement of thyroid  cartilage with complete approximation of arytenoids to epiglottic petiole Anterior hyoid excursion: Complete anterior movement Epiglottic movement: Complete inversion Laryngeal vestibule closure: Incomplete, narrow column air/contrast in laryngeal vestibule Pharyngeal stripping wave : Present - complete Pharyngeal contraction (A/P view only): N/A Pharyngoesophageal segment opening: Partial distention/partial duration, partial obstruction of flow Tongue base retraction: Trace column of contrast or air  between tongue base and PPW Pharyngeal residue: Trace residue within or on pharyngeal structures Location of pharyngeal residue: Valleculae; Pyriform sinuses  Esophageal Impairment Domain: No data recorded Pill: Pill Consistency administered: -- (NT) Penetration/Aspiration Scale Score: Penetration/Aspiration Scale Score 8.  Material enters airway, passes BELOW cords without attempt by patient to eject out (silent aspiration) : Thin liquids (Level 0) Compensatory Strategies: Compensatory Strategies Compensatory strategies: Yes Chin tuck: Effective Effective Chin Tuck:  Thin liquid (Level 0)   General Information: Caregiver present: Yes  Diet Prior to this Study: Regular; Mildly thick liquids (Level 2, nectar thick)   No data recorded  Respiratory Status: WFL   Supplemental O2: None (Room air)   History of Recent Intubation: No  Behavior/Cognition: Alert; Cooperative; Pleasant mood Self-Feeding Abilities: Able to self-feed Baseline vocal quality/speech: Normal Volitional Cough: Able to elicit Volitional Swallow: Able to elicit Exam Limitations: Limited visibility; Other (comment) (Imaging techniques) Goal Planning: No data recorded No data recorded No data recorded No data recorded No data recorded Pain: Pain Assessment Pain Assessment: No/denies pain End of Session: Start Time:SLP Start Time (ACUTE ONLY): 1140 Stop Time: SLP Stop Time (ACUTE ONLY): 1157 Time Calculation:SLP Time Calculation (min) (ACUTE ONLY): 17 min Charges: SLP Evaluations $ SLP Speech Visit: 1 Visit SLP Evaluations $Outpatient MBS Swallow: 1 Procedure SLP visit diagnosis: SLP Visit Diagnosis: Dysphagia, oropharyngeal phase (R13.12) Past Medical History: Past Medical History: Diagnosis Date  COVID-19   Diabetes mellitus without complication (HCC)   Dyspnea   History of hiatal hernia   Hyperglycemia   Hyperlipidemia   Hypertension   Lung nodule   Myocardial infarction (HCC)   Presence of permanent cardiac pacemaker  Past Surgical History: Past  Surgical History: Procedure Laterality Date  APPENDECTOMY  1956  BACK SURGERY  2008  CARDIAC CATHETERIZATION  1995  negative results  CARDIOVERSION N/A 08/05/2017  Procedure: CARDIOVERSION;  Surgeon: Darlis Eisenmenger, MD;  Location: [facility] ENDOSCOPY;  Service: Cardiovascular;  Laterality: N/A;  CARDIOVERSION N/A 09/12/2017  Procedure: CARDIOVERSION;  Surgeon: Darlis Eisenmenger, MD;  Location: [facility] ENDOSCOPY;  Service: Cardiovascular;  Laterality: N/A;  CARDIOVERSION N/A 09/15/2021  Procedure: CARDIOVERSION;  Surgeon: Darlis Eisenmenger, MD;  Location: [facility] ENDOSCOPY;  Service: Cardiovascular;  Laterality: N/A;  CARDIOVERSION N/A 10/12/2021  Procedure: CARDIOVERSION;  Surgeon: Darlis Eisenmenger, MD;  Location: [facility] ENDOSCOPY;  Service: Cardiovascular;  Laterality: N/A;  COLON SURGERY  07/09/12  HERNIA REPAIR  1952  HOT HEMOSTASIS  01/08/2012  Procedure: HOT HEMOSTASIS (ARGON PLASMA COAGULATION/BICAP);  Surgeon: Evangeline Hilts, MD;  Location: [facility] ENDOSCOPY;  Service: Endoscopy;  Laterality: N/A;  LAPAROSCOPIC ILEOCECECTOMY  07/09/2012  Procedure: LAPAROSCOPIC ILEOCECECTOMY;  Surgeon: Quitman Bucy, MD;  Location: [facility] ORS;  Service: General;  Laterality: N/A;  Laparoscopic Ileocecectomy  PACEMAKER IMPLANT N/A 10/05/2017  Procedure: PACEMAKER IMPLANT;  Surgeon: Verona Goodwill, MD;  Location: [facility] INVASIVE CV LAB;  Service: Cardiovascular;  Laterality: N/A;  REPLACEMENT TOTAL KNEE  2010  TEE WITHOUT CARDIOVERSION N/A 08/05/2017  Procedure: TRANSESOPHAGEAL ECHOCARDIOGRAM (TEE);  Surgeon: Darlis Eisenmenger, MD;  Location: [facility] ENDOSCOPY;  Service: Cardiovascular;  Laterality: N/A;  TEE WITHOUT CARDIOVERSION N/A 09/12/2017  Procedure: TRANSESOPHAGEAL ECHOCARDIOGRAM (TEE);  Surgeon: Darlis Eisenmenger, MD;  Location: [facility] ENDOSCOPY;  Service: Cardiovascular;  Laterality: N/A; Henri Loft. Beatris Lincoln, MA CCC/SLP Clinical Specialist - Acute Care SLP Acute Rehabilitation Services Office number [phone] Myna Asal Laurice 12/17/2023, 2:44 PM CLINICAL  DATA:  Patient with a history of aspiration and dysphagia. EXAM: MODIFIED BARIUM SWALLOW TECHNIQUE: Different consistencies of barium were administered orally to the patient by the Speech Pathologist. Imaging of the pharynx was performed in the lateral projection. Jetta Morrow NP was present in the fluoroscopy room during this study, which was supervised and interpreted by Dr. Jinx Mourning. FLUOROSCOPY: Radiation Exposure Index (as provided by the fluoroscopic device): 23.52 mGy Kerma COMPARISON:  DG swallow 11/20/2023 FINDINGS: Vestibular  Penetration:  Observed on several occasions. Aspiration:  Trace aspiration Other:  None. IMPRESSION:  Vestibular penetration and trace aspiration observed. Please refer to the Speech Pathologists report for complete details and recommendations. Electronically Signed   By: Creasie Doctor M.D.   On: 12/17/2023 14:18   Microbiology: Recent Results (from the past 240 hours)  Blood Culture (routine x 2)     Status: None   Collection Time: 12/26/23  2:30 PM   Specimen: BLOOD RIGHT ARM  Result Value Ref Range Status   Specimen Description BLOOD RIGHT ARM  Final   Special Requests   Final    BOTTLES DRAWN AEROBIC AND ANAEROBIC Blood Culture adequate volume   Culture   Final    NO GROWTH 5 DAYS Performed at [facility], [address], [place], [place] [address]    Report Status 12/31/2023 FINAL  Final  Resp panel by RT-PCR (RSV, Flu A&B, Covid) Anterior Nasal Swab     Status: None   Collection Time: 12/26/23  2:32 PM   Specimen: Anterior Nasal Swab  Result Value Ref Range Status   SARS Coronavirus 2 by RT PCR NEGATIVE NEGATIVE Final   Influenza A by PCR NEGATIVE NEGATIVE Final   Influenza B by PCR NEGATIVE NEGATIVE Final    Comment: (NOTE) The Xpert Xpress SARS-CoV-2/FLU/RSV plus assay is intended as an aid in the diagnosis of influenza from Nasopharyngeal swab specimens and should not be used as a sole basis for treatment. Nasal washings and aspirates are  unacceptable for Xpert Xpress SARS-CoV-2/FLU/RSV testing.  Fact Sheet for Patients: BloggerCourse.com  Fact Sheet for Healthcare Providers: SeriousBroker.it  This test is not yet approved or cleared by the [place]  FDA and has been authorized for detection and/or diagnosis of SARS-CoV-2 by FDA under an Emergency Use Authorization (EUA). This EUA will remain in effect (meaning this test can be used) for the duration of the COVID-19 declaration under Section 564(b)(1) of the Act, 21 U.S.C. section 360bbb-3(b)(1), unless the authorization is terminated or revoked.     Resp Syncytial Virus by PCR NEGATIVE NEGATIVE Final    Comment: (NOTE) Fact Sheet for Patients: BloggerCourse.com  Fact Sheet for Healthcare Providers: SeriousBroker.it  This test is not yet approved or cleared by the [place]  FDA and has been authorized for detection and/or diagnosis of SARS-CoV-2 by FDA under an Emergency Use Authorization (EUA). This EUA will remain in effect (meaning this test can be used) for the duration of the COVID-19 declaration under Section 564(b)(1) of the Act, 21 U.S.C. section 360bbb-3(b)(1), unless the authorization is terminated or revoked.  Performed at [facility], [address], [place], [place] [address]   Blood Culture (routine x 2)     Status: None   Collection Time: 12/26/23  2:44 PM   Specimen: BLOOD RIGHT ARM  Result Value Ref Range Status   Specimen Description BLOOD RIGHT ARM  Final   Special Requests   Final    BOTTLES DRAWN AEROBIC AND ANAEROBIC Blood Culture adequate volume   Culture   Final    NO GROWTH 5 DAYS Performed at [facility], [address], [place], [place] [address]    Report Status 12/31/2023 FINAL  Final  MRSA Next Gen by PCR, Nasal     Status: Abnormal   Collection Time: 12/27/23  4:30 AM   Specimen: Nasal Mucosa; Nasal  Swab  Result Value Ref Range Status   MRSA by PCR Next Gen DETECTED (A) NOT DETECTED Final    Comment: RESULT CALLED TO, READ BACK BY AND VERIFIED WITH:  P BURTON,RN@[id number]  12/27/23 MK (NOTE) The GeneXpert MRSA Assay (FDA approved for NASAL specimens only), is one component of a comprehensive MRSA colonization surveillance program. It is not intended to diagnose MRSA infection nor to guide or monitor treatment for MRSA infections. Test performance is not FDA approved in patients less than 64 years old. Performed at [facility], [address], [place], [place] [address]   Respiratory (~20 pathogens) panel by PCR     Status: None   Collection Time: 12/27/23  8:42 AM   Specimen: Nasopharyngeal Swab; Respiratory  Result Value Ref Range Status   Adenovirus NOT DETECTED NOT DETECTED Final   Coronavirus 229E NOT DETECTED NOT DETECTED Final    Comment: (NOTE) The Coronavirus on the Respiratory Panel, DOES NOT test for the novel  Coronavirus (2019 nCoV)    Coronavirus HKU1 NOT DETECTED NOT DETECTED Final   Coronavirus NL63 NOT DETECTED NOT DETECTED Final   Coronavirus OC43 NOT DETECTED NOT DETECTED Final   Metapneumovirus NOT DETECTED NOT DETECTED Final   Rhinovirus / Enterovirus NOT DETECTED NOT DETECTED Final   Influenza A NOT DETECTED NOT DETECTED Final   Influenza B NOT DETECTED NOT DETECTED Final   Parainfluenza Virus 1 NOT DETECTED NOT DETECTED Final   Parainfluenza Virus 2 NOT DETECTED NOT DETECTED Final   Parainfluenza Virus 3 NOT DETECTED NOT DETECTED Final   Parainfluenza Virus 4 NOT DETECTED NOT DETECTED Final   Respiratory Syncytial Virus NOT DETECTED NOT DETECTED Final   Bordetella pertussis NOT DETECTED NOT DETECTED Final   Bordetella Parapertussis NOT DETECTED NOT DETECTED Final   Chlamydophila pneumoniae NOT DETECTED NOT DETECTED Final   Mycoplasma pneumoniae NOT DETECTED NOT DETECTED Final    Comment: Performed at [facility], [address],  [place], [place] [address]    Time spent: *** minutes  Signed: Seena Dadds, MD {[email or web address]:[id number]}

## 2024-01-19 DEATH — deceased
